# Patient Record
Sex: Female | Born: 1950
Health system: Southern US, Community
[De-identification: ages and names within clinical notes are randomized; demographics above are authoritative.]

## PROBLEM LIST (undated history)

## (undated) DIAGNOSIS — K589 Irritable bowel syndrome without diarrhea: Secondary | ICD-10-CM

## (undated) DIAGNOSIS — L309 Dermatitis, unspecified: Secondary | ICD-10-CM

## (undated) DIAGNOSIS — R51 Headache: Secondary | ICD-10-CM

## (undated) DIAGNOSIS — F419 Anxiety disorder, unspecified: Secondary | ICD-10-CM

## (undated) DIAGNOSIS — Z803 Family history of malignant neoplasm of breast: Secondary | ICD-10-CM

## (undated) DIAGNOSIS — Z923 Personal history of irradiation: Secondary | ICD-10-CM

## (undated) DIAGNOSIS — J300 Vasomotor rhinitis: Secondary | ICD-10-CM

## (undated) DIAGNOSIS — H04129 Dry eye syndrome of unspecified lacrimal gland: Secondary | ICD-10-CM

## (undated) DIAGNOSIS — N393 Stress incontinence (female) (male): Secondary | ICD-10-CM

## (undated) DIAGNOSIS — C801 Malignant (primary) neoplasm, unspecified: Secondary | ICD-10-CM

## (undated) DIAGNOSIS — J310 Chronic rhinitis: Secondary | ICD-10-CM

## (undated) DIAGNOSIS — Z853 Personal history of malignant neoplasm of breast: Secondary | ICD-10-CM

## (undated) DIAGNOSIS — Z85118 Personal history of other malignant neoplasm of bronchus and lung: Secondary | ICD-10-CM

## (undated) DIAGNOSIS — R519 Headache, unspecified: Secondary | ICD-10-CM

## (undated) DIAGNOSIS — IMO0002 Reserved for concepts with insufficient information to code with codable children: Secondary | ICD-10-CM

## (undated) DIAGNOSIS — Z9889 Other specified postprocedural states: Secondary | ICD-10-CM

## (undated) DIAGNOSIS — M1990 Unspecified osteoarthritis, unspecified site: Secondary | ICD-10-CM

## (undated) DIAGNOSIS — Z800 Family history of malignant neoplasm of digestive organs: Secondary | ICD-10-CM

## (undated) DIAGNOSIS — K219 Gastro-esophageal reflux disease without esophagitis: Secondary | ICD-10-CM

## (undated) DIAGNOSIS — R42 Dizziness and giddiness: Secondary | ICD-10-CM

## (undated) DIAGNOSIS — E0510 Thyrotoxicosis with toxic single thyroid nodule without thyrotoxic crisis or storm: Secondary | ICD-10-CM

## (undated) DIAGNOSIS — R112 Nausea with vomiting, unspecified: Secondary | ICD-10-CM

## (undated) DIAGNOSIS — C50919 Malignant neoplasm of unspecified site of unspecified female breast: Secondary | ICD-10-CM

## (undated) DIAGNOSIS — IMO0001 Reserved for inherently not codable concepts without codable children: Secondary | ICD-10-CM

## (undated) DIAGNOSIS — E049 Nontoxic goiter, unspecified: Secondary | ICD-10-CM

## (undated) HISTORY — PX: KNEE SURGERY: SHX244

## (undated) HISTORY — DX: Reserved for inherently not codable concepts without codable children: IMO0001

## (undated) HISTORY — PX: OTHER SURGICAL HISTORY: SHX169

## (undated) HISTORY — DX: Irritable bowel syndrome, unspecified: K58.9

## (undated) HISTORY — DX: Reserved for concepts with insufficient information to code with codable children: IMO0002

## (undated) HISTORY — DX: Malignant (primary) neoplasm, unspecified: C80.1

## (undated) HISTORY — PX: WISDOM TOOTH EXTRACTION: SHX21

## (undated) HISTORY — PX: LUNG REMOVAL, PARTIAL: SHX233

## (undated) HISTORY — DX: Family history of malignant neoplasm of breast: Z80.3

## (undated) HISTORY — DX: Vasomotor rhinitis: J30.0

## (undated) HISTORY — PX: SEPTOPLASTY: SUR1290

## (undated) HISTORY — DX: Dry eye syndrome of unspecified lacrimal gland: H04.129

## (undated) HISTORY — DX: Family history of malignant neoplasm of digestive organs: Z80.0

## (undated) HISTORY — PX: TRIGGER FINGER RELEASE: SHX641

## (undated) HISTORY — PX: MASTECTOMY: SHX3

## (undated) HISTORY — PX: NASAL SINUS SURGERY: SHX719

## (undated) HISTORY — DX: Chronic rhinitis: J31.0

---
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
## 1999-02-07 ENCOUNTER — Other Ambulatory Visit: Admission: RE | Admit: 1999-02-07 | Discharge: 1999-02-07 | Payer: Self-pay | Admitting: Family Medicine

## 1999-03-07 ENCOUNTER — Encounter: Payer: Self-pay | Admitting: Family Medicine

## 1999-03-07 ENCOUNTER — Ambulatory Visit (HOSPITAL_COMMUNITY): Admission: RE | Admit: 1999-03-07 | Discharge: 1999-03-07 | Payer: Self-pay | Admitting: Family Medicine

## 1999-07-14 ENCOUNTER — Ambulatory Visit (HOSPITAL_BASED_OUTPATIENT_CLINIC_OR_DEPARTMENT_OTHER): Admission: RE | Admit: 1999-07-14 | Discharge: 1999-07-14 | Payer: Self-pay | Admitting: Orthopedic Surgery

## 1999-07-14 HISTORY — PX: TRIGGER FINGER RELEASE: SHX641

## 1999-09-08 ENCOUNTER — Encounter: Payer: Self-pay | Admitting: Family Medicine

## 1999-09-08 ENCOUNTER — Encounter: Admission: RE | Admit: 1999-09-08 | Discharge: 1999-09-08 | Payer: Self-pay | Admitting: Family Medicine

## 2000-03-08 ENCOUNTER — Ambulatory Visit (HOSPITAL_COMMUNITY): Admission: RE | Admit: 2000-03-08 | Discharge: 2000-03-08 | Payer: Self-pay | Admitting: Neurology

## 2002-07-17 ENCOUNTER — Ambulatory Visit (HOSPITAL_BASED_OUTPATIENT_CLINIC_OR_DEPARTMENT_OTHER): Admission: RE | Admit: 2002-07-17 | Discharge: 2002-07-17 | Payer: Self-pay | Admitting: Orthopedic Surgery

## 2002-07-17 HISTORY — PX: TRIGGER FINGER RELEASE: SHX641

## 2002-07-31 ENCOUNTER — Ambulatory Visit (HOSPITAL_BASED_OUTPATIENT_CLINIC_OR_DEPARTMENT_OTHER): Admission: RE | Admit: 2002-07-31 | Discharge: 2002-07-31 | Payer: Self-pay | Admitting: Orthopedic Surgery

## 2002-07-31 HISTORY — PX: DE QUERVAIN'S RELEASE: SHX1439

## 2002-07-31 HISTORY — PX: TRIGGER FINGER RELEASE: SHX641

## 2002-08-10 ENCOUNTER — Encounter: Admission: RE | Admit: 2002-08-10 | Discharge: 2002-08-10 | Payer: Self-pay | Admitting: Family Medicine

## 2002-08-10 ENCOUNTER — Encounter: Payer: Self-pay | Admitting: Family Medicine

## 2002-10-15 ENCOUNTER — Encounter: Payer: Self-pay | Admitting: Gastroenterology

## 2002-10-15 ENCOUNTER — Ambulatory Visit (HOSPITAL_COMMUNITY): Admission: RE | Admit: 2002-10-15 | Discharge: 2002-10-15 | Payer: Self-pay | Admitting: Gastroenterology

## 2002-11-23 ENCOUNTER — Encounter: Payer: Self-pay | Admitting: Gastroenterology

## 2002-11-23 ENCOUNTER — Ambulatory Visit: Admission: RE | Admit: 2002-11-23 | Discharge: 2002-11-23 | Payer: Self-pay | Admitting: Gastroenterology

## 2003-09-22 ENCOUNTER — Encounter: Admission: RE | Admit: 2003-09-22 | Discharge: 2003-09-22 | Payer: Self-pay | Admitting: Gastroenterology

## 2003-09-22 IMAGING — US US ABDOMEN COMPLETE
1 series · 14 of 25 positions shown · non-contrast
Comparison: none

CLINICAL DATA: Right upper quadrant pain. 
 COMPLETE ABDOMINAL ULTRASOUND: 
 Gallbladder size and contour are normal.  No stones or wall thickening.  No biliary dilatation with the common duct measured at 3.3mm. 
 Liver, pancreas, and spleen normal.  Kidney size normal without obstruction or calculi.  Aorta and IVC normal.

[Series 1: unknown · 0.26mm/px · 14 of 77 slices shown]
[im 1/77]
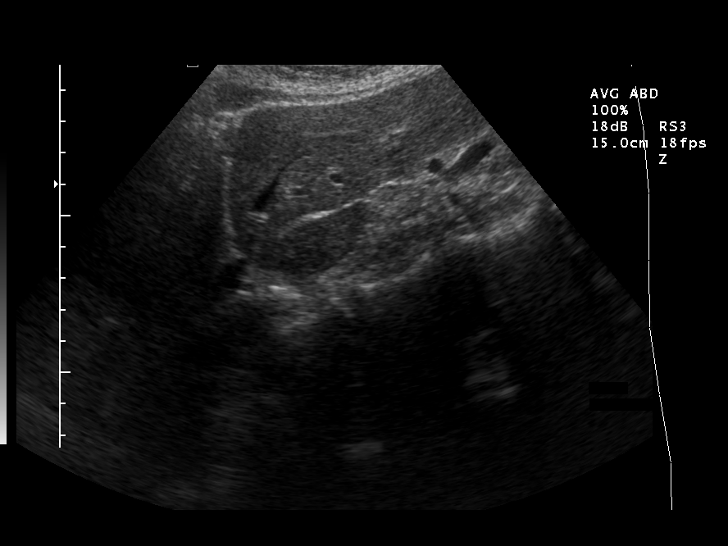
[im 7/77]
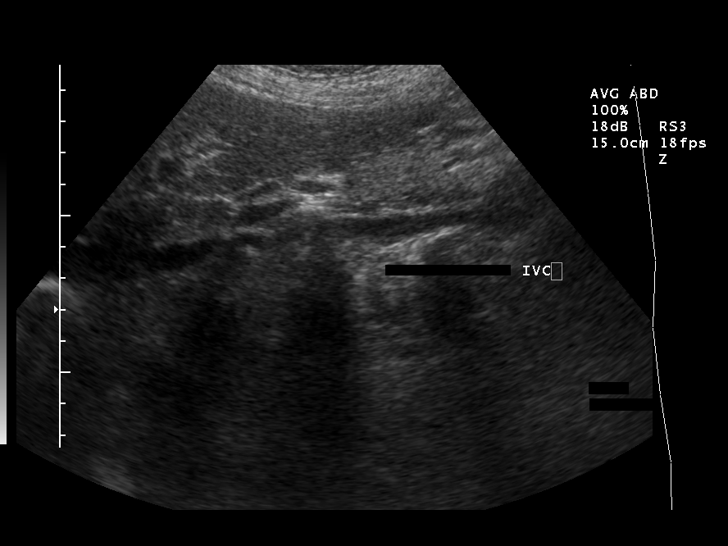
[im 13/77]
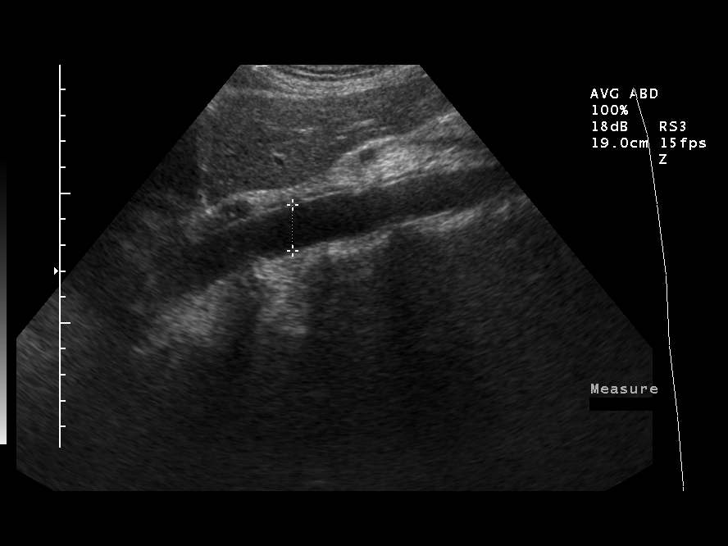
[im 20/77]
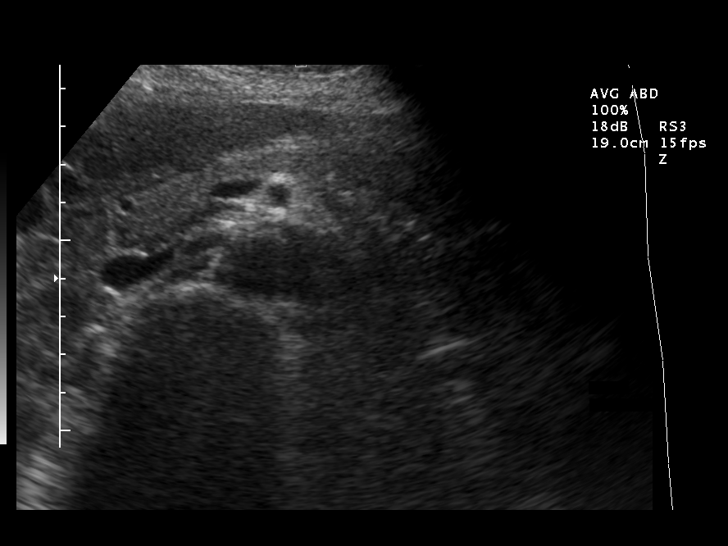
[im 26/77]
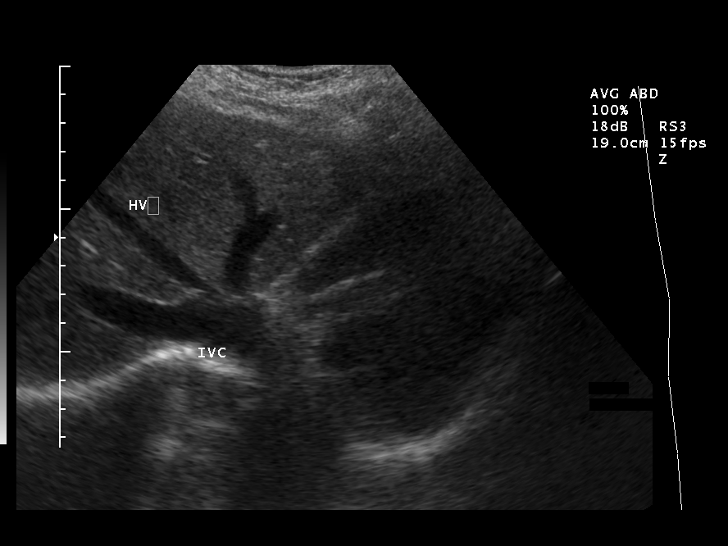
[im 29/77]
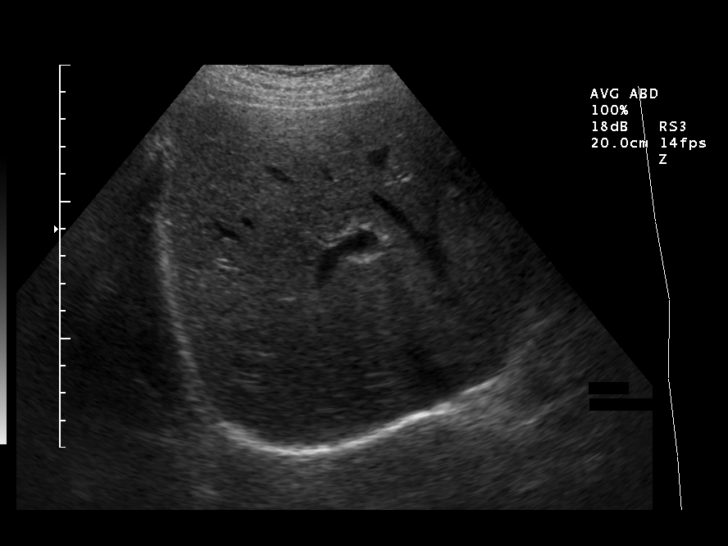
[im 35/77]
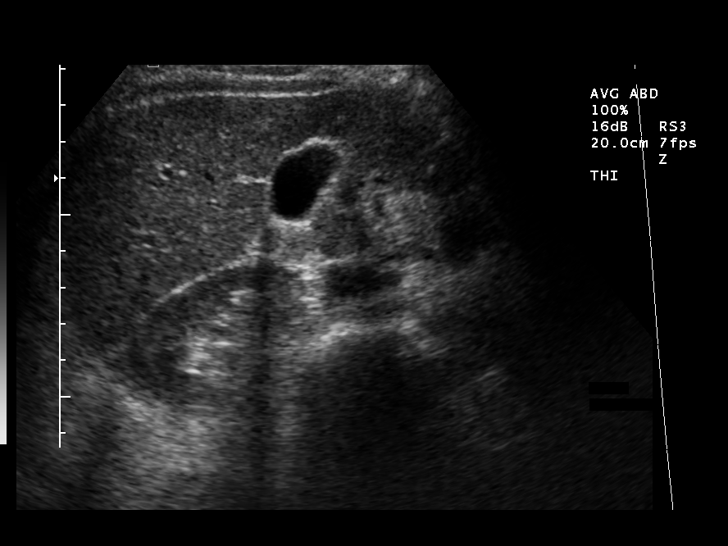
[im 42/77]
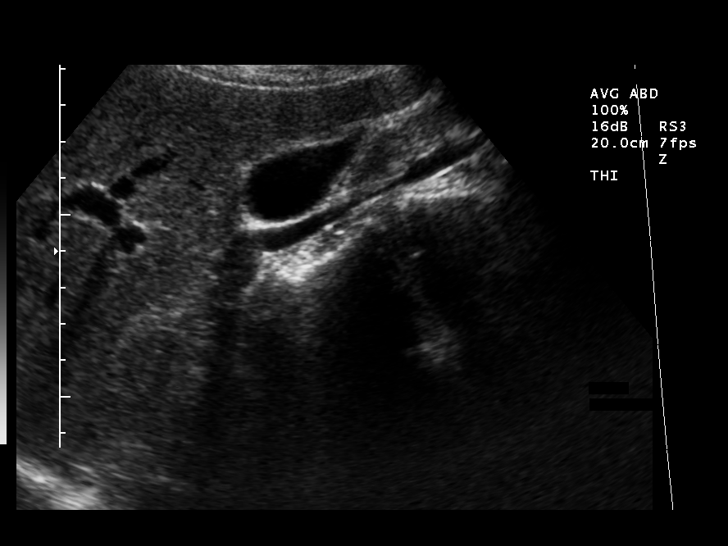
[im 48/77]
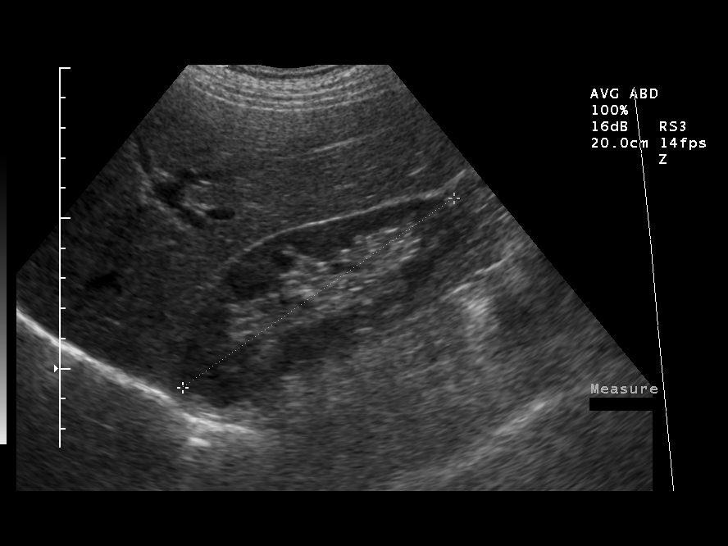
[im 51/77]
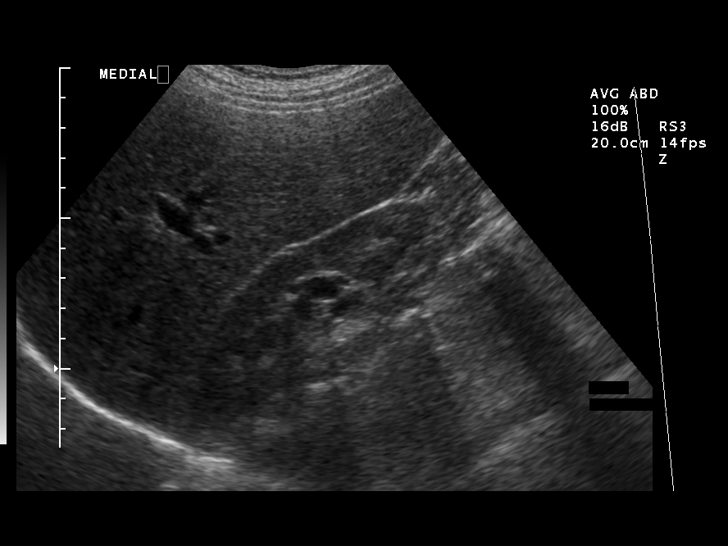
[im 58/77]
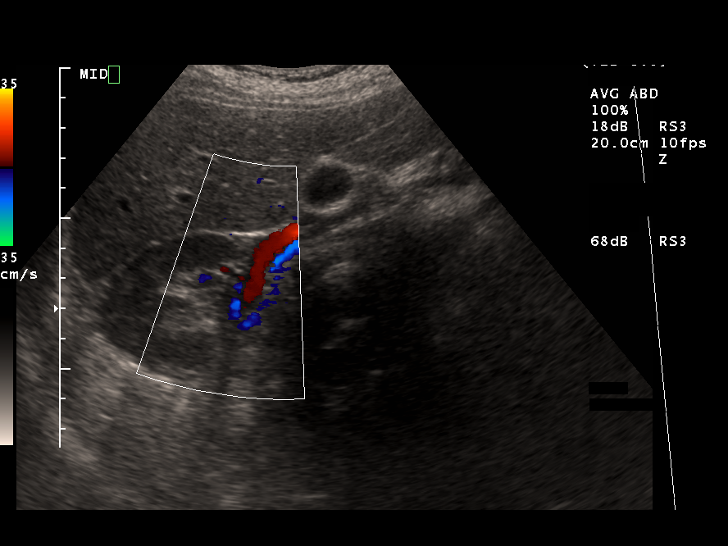
[im 64/77]
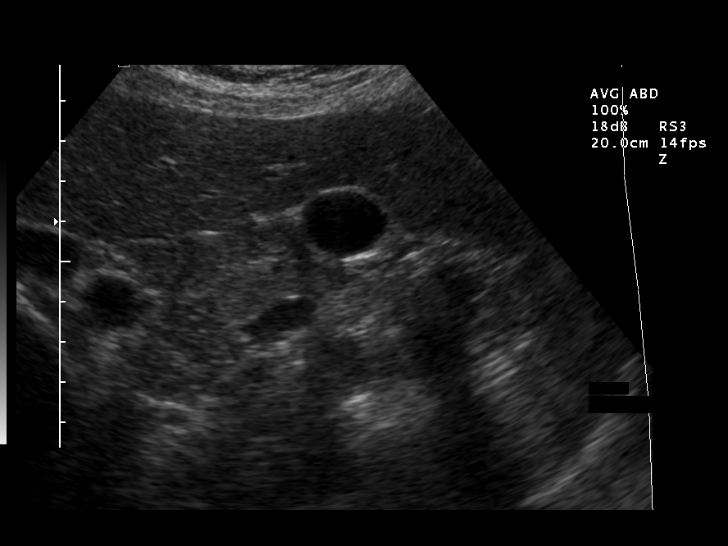
[im 70/77]
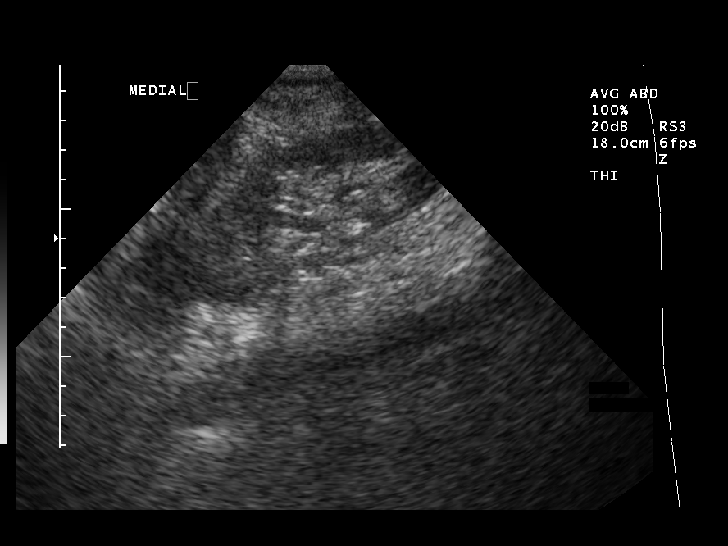
[im 77/77]
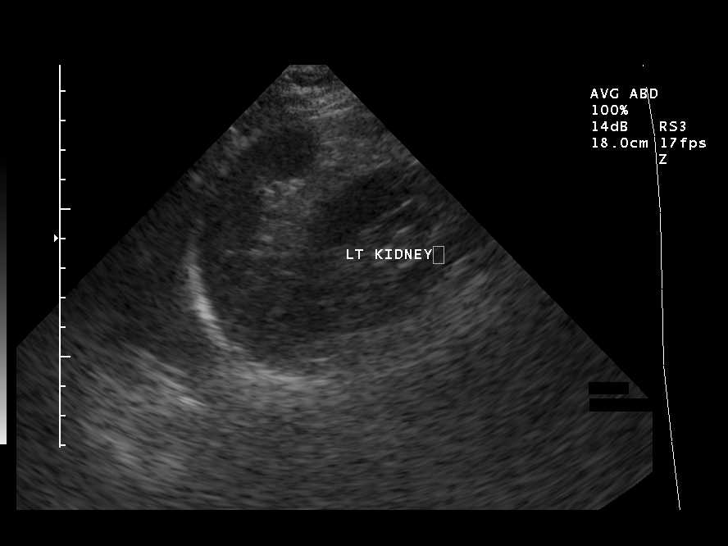

[14 of 25 positions shown; findings below may reference images not displayed]

IMPRESSION: Normal study.

## 2004-05-31 ENCOUNTER — Ambulatory Visit: Payer: Self-pay | Admitting: Pulmonary Disease

## 2004-06-02 ENCOUNTER — Ambulatory Visit (HOSPITAL_COMMUNITY): Admission: RE | Admit: 2004-06-02 | Discharge: 2004-06-02 | Payer: Self-pay | Admitting: Pulmonary Disease

## 2004-06-02 IMAGING — CT NM PET TUM IMG SKULL BASE T - THIGH
5 series · 25 of 25 positions shown · IV contrast ([ID])
Comparison: None.

CLINICAL DATA: Solitary left apical lung nodule on recent outside diagnostic
chest CT (not currently available for direct comparison).

FDG PET-CT TUMOR IMAGING (SKULL BASE TO THIGHS)  [DATE]:
Fasting Blood Glucose:  92
TECHNIQUE: 18.1 mCi F-18 FDG was injected intravenously via the right
antecubital vein .  Full-ring PET imaging was performed from the skull base
through the mid-thighs 60 minutes after injection.  CT data was obtained and
used for attenuation correction and anatomic localization only.  (This was not
acquired as a diagnostic CT examination.)

[Series 1: pet ac · axial · 3.3mm · 4.69mm/px · z∈[-870,+0]mm · 6 of 267 slices shown]
[im 1/267]
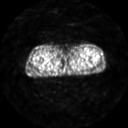
[im 54/267]
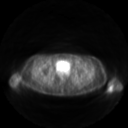
[im 107/267]
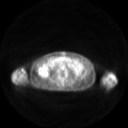
[im 160/267]
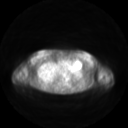
[im 213/267]
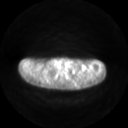
[im 267/267]
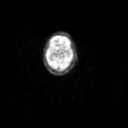

[Series 2: pet nac · axial · 3.3mm · 4.69mm/px · z∈[-870,+0]mm · 6 of 267 slices shown]
[im 1/267]
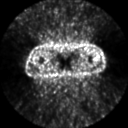
[im 54/267]
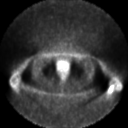
[im 107/267]
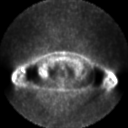
[im 160/267]
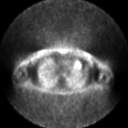
[im 213/267]
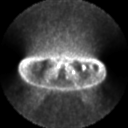
[im 267/267]
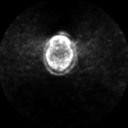

[Series 2: ct images · axial · 3.8mm · 0.98mm/px · z∈[-870,+0]mm · 6 of 267 slices shown]
[im 1/267]
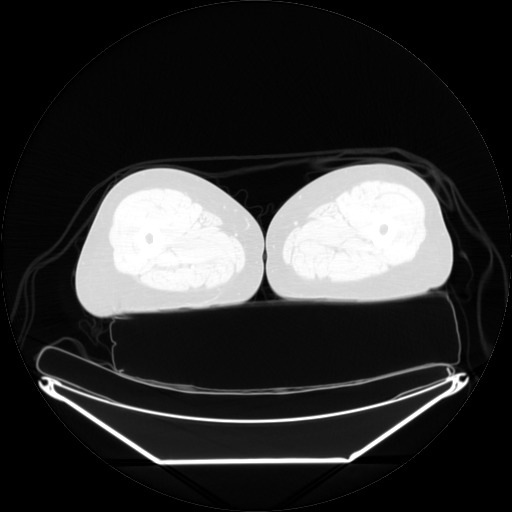
[im 54/267]
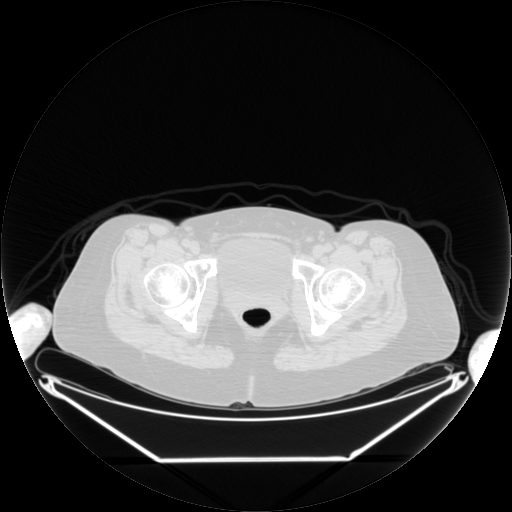
[im 107/267]
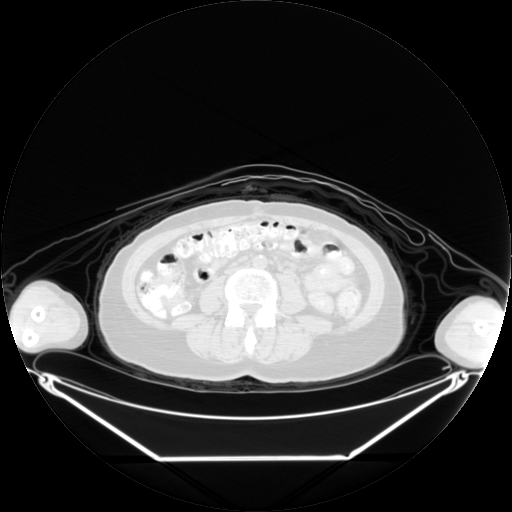
[im 160/267]
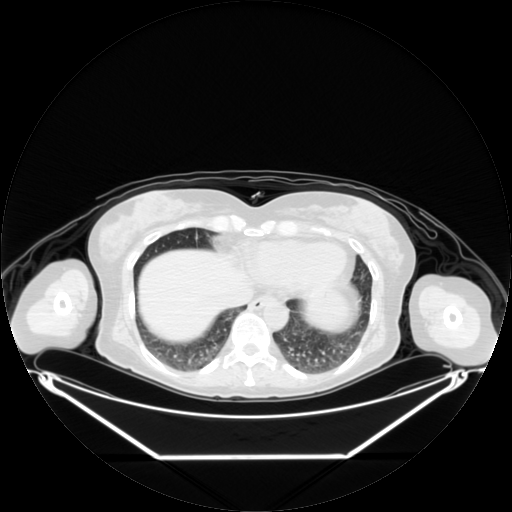
[im 213/267]
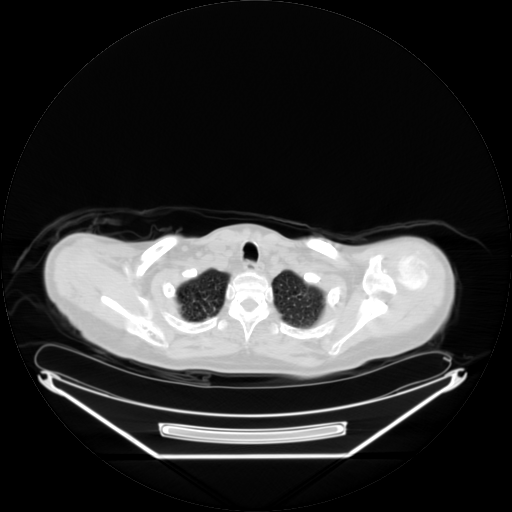
[im 267/267  brain]
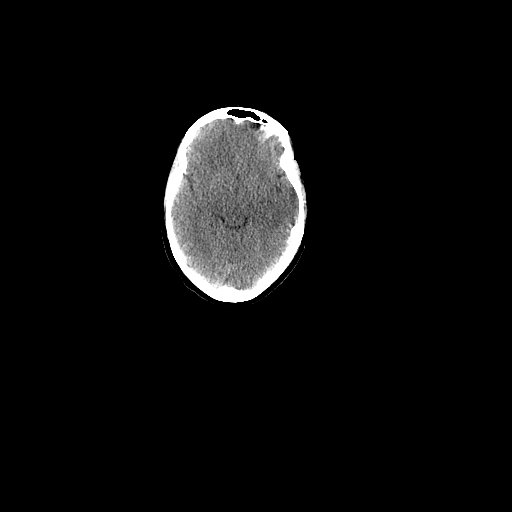

[Series 3: pet ac (id) · axial · 3.3mm · 4.69mm/px · z∈[-870,+0]mm · 6 of 267 slices shown]
[im 1/267]
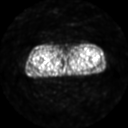
[im 54/267]
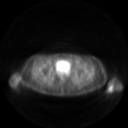
[im 107/267]
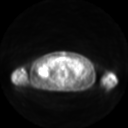
[im 160/267]
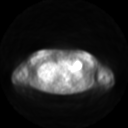
[im 213/267]
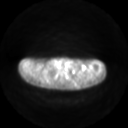
[im 267/267]
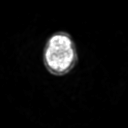

[Series 151: reformatted · axial · 3.3mm · 0.98mm/px · 1 of 2 slices shown]
[im 1/2]
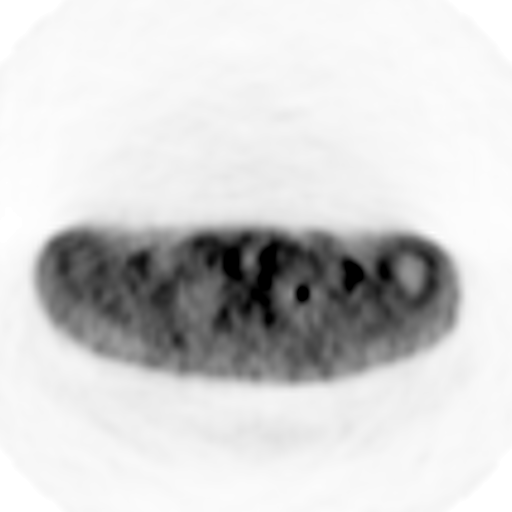

[25 of 25 positions shown; findings below may reference images not displayed]

FINDINGS: The approximate 1 cm nodule in the apex of the left lung does
demonstrate increased metabolic activity with a maximum SUV of 3.6. Given the
small size of the nodule, this is consistent with malignancy.

No abnormal metabolic activity is identified in the neck, chest, abdomen or
pelvis to suggest metastatic disease. There is increased metabolic activity
localizing to hypermetabolic brown fat in the supraclavicular regions
bilaterally and in the fat adjacent to several upper thoracic vertebra. There is
also a focus of increased metabolic activity localizing to a nodule in the right
lobe of the thyroid gland.

Physiologic activity is present within the left ventricle. There is physiologic
activity in the luminal contents of the large and small bowel. Physiologic
excretion into the urinary tract is noted.
IMPRESSION: 1. Increased metabolic activity within the left apical lung nodule, consistent
with malignancy.

2. No evidence of metastatic disease.

3. Increased metabolic activity within a nodule in the right lobe of the thyroid
gland. This is a nonspecific finding area can be seen in malignancy or an
hyperfunctioning thyroid nodules. 

4. Hypermetabolic brown fat in the supraclavicular regions bilaterally and
adjacent to several upper thoracic vertebra.

## 2004-06-15 ENCOUNTER — Encounter (HOSPITAL_COMMUNITY): Admission: RE | Admit: 2004-06-15 | Discharge: 2004-09-13 | Payer: Self-pay | Admitting: Pulmonary Disease

## 2004-06-16 IMAGING — NM NM THYROID IMAGING W/ UPTAKE SINGLE (24 HR)
1 series · 1 of 1 positions shown · non-contrast
Comparison: PET CT of [DATE].

CLINICAL DATA: Thyroid nodule seen on PET scan. 
NUCLEAR MEDICINE THYROID SCAN:

[thyroid scan · 1 of 1 slices shown]
[im 1/1]
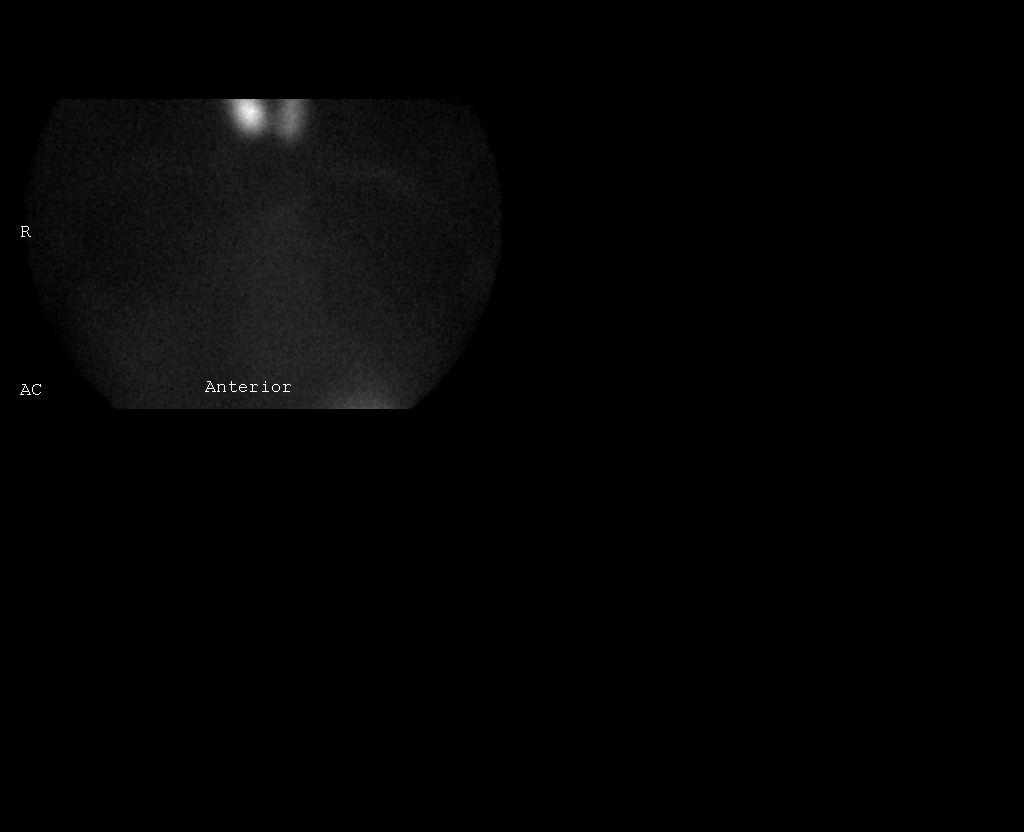

[1 of 1 positions shown; findings below may reference images not displayed]

There is heterogeneity in the right thyroid lobe.  This includes a hot nodule inferiorly in the right thyroid lobe.  There may be several separate nodules based on oblique imaging.  The hot nodule at the base of the thyroid measures approximately 1.2 x 0.7 cm.  The right lobe itself measures approximately 3.5 cm in greatest length and the left lobe 3.1 cm in greatest length.
IMPRESSION: Heterogeneity in the inferior aspect of the right thyroid gland corresponding to the abnormal FDG activity on PET scan.  Although this nodule is predominately increased in activity, there is some lower activity surrounding it, and the possibility of thyroid cancer is not readily excluded particularly in light of the PET CT findings.  I believe the inferior aspect of the right thyroid gland merits fine needle aspiration in this setting.

## 2004-06-19 ENCOUNTER — Ambulatory Visit: Payer: Self-pay | Admitting: Pulmonary Disease

## 2004-06-28 ENCOUNTER — Encounter (INDEPENDENT_AMBULATORY_CARE_PROVIDER_SITE_OTHER): Payer: Self-pay | Admitting: *Deleted

## 2004-06-28 ENCOUNTER — Other Ambulatory Visit: Admission: RE | Admit: 2004-06-28 | Discharge: 2004-06-28 | Payer: Self-pay | Admitting: Interventional Radiology

## 2004-06-28 ENCOUNTER — Encounter: Admission: RE | Admit: 2004-06-28 | Discharge: 2004-06-28 | Payer: Self-pay | Admitting: Pulmonary Disease

## 2004-06-28 IMAGING — US US BIOPSY
1 series · 13 of 14 positions shown · non-contrast
Comparison: none

CLINICAL DATA: 53-year-old female with hypermetabolic activity within the right lobe of thyroid seen on recent PET CT scan.  This nodule was predominately hot on nuclear medicine thyroid scan.  Request to perform fine needle aspiration. 
Comparison; 1.  PET CT scan [DATE].  2.  Nuclear medicine thyroid scan [DATE].  The patient also had a thyroid ultrasound which was performed at [HOSPITAL]. Those images were not available for comparison. 
Procedure: 
ULTRASOUND GUIDED FINE NEEDLE ASPIRATION RIGHT LOBE OF THYROID:
The above procedure was thoroughly discussed with the patient and questions were answered.  Written informed consent was obtained prior to the procedure. 
Ultrasound was then performed which demonstrated multiple nodules located within both the mid and inferior aspect of the right lobe of the thyroid.  The dominant nodule located within the inferior aspect of the right lobe of the thyroid measuring approximately 1.2 x 1.3 cm was localized and marked for aspiration.  The patient was then prepped and draped in a normal sterile fashion, 1 percent Lidocaine was used for local anesthesia.  Using direct ultrasound guidance a total of four passes were made into the inferior nodule using a 25 gauge hypodermic needle.  Ultrasound confirmed placement of the needle on all four occasions.  The specimens were given to pathology for further analysis.  Post-procedure imaging demonstrated no immediate complication or hematoma.  The patient tolerated the procedure well.

[Series 1: unknown · 0.07mm/px · 13 of 14 slices shown]
[im 1/14]
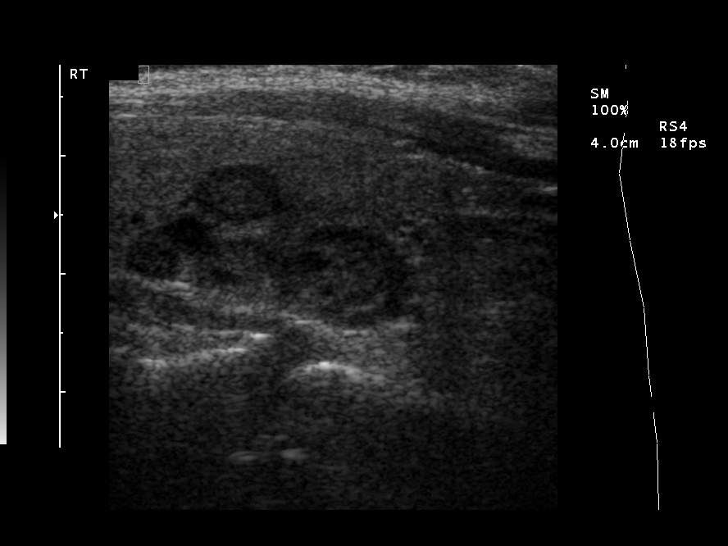
[im 2/14]
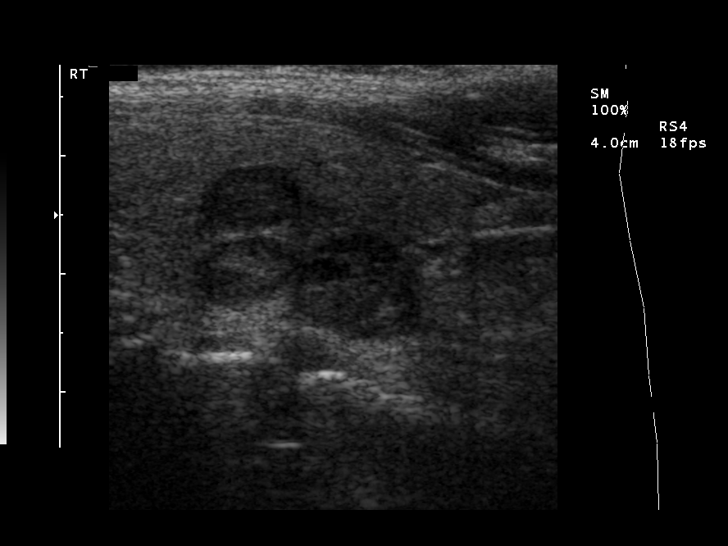
[im 3/14]
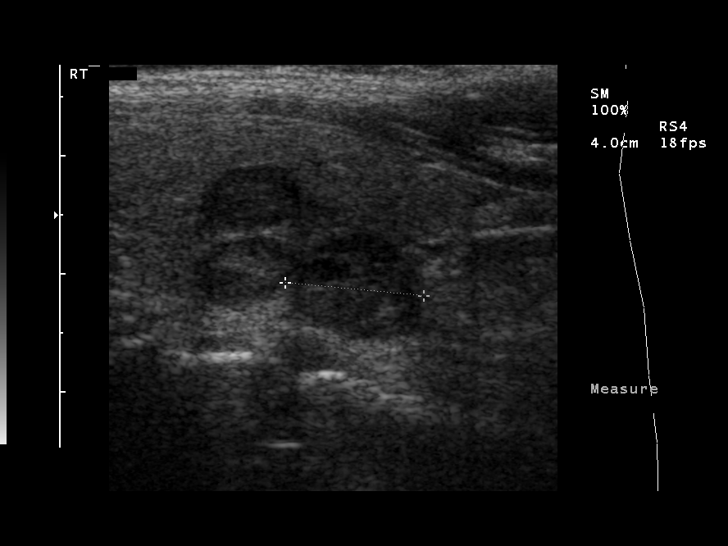
[im 4/14]
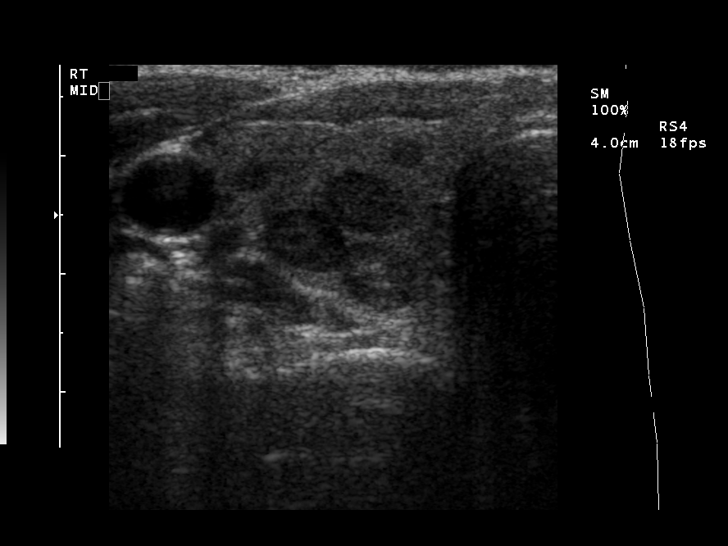
[im 5/14]
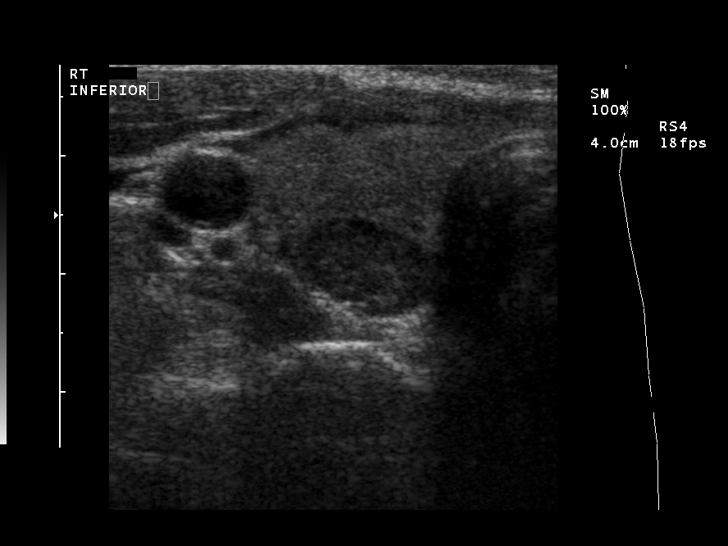
[im 6/14]
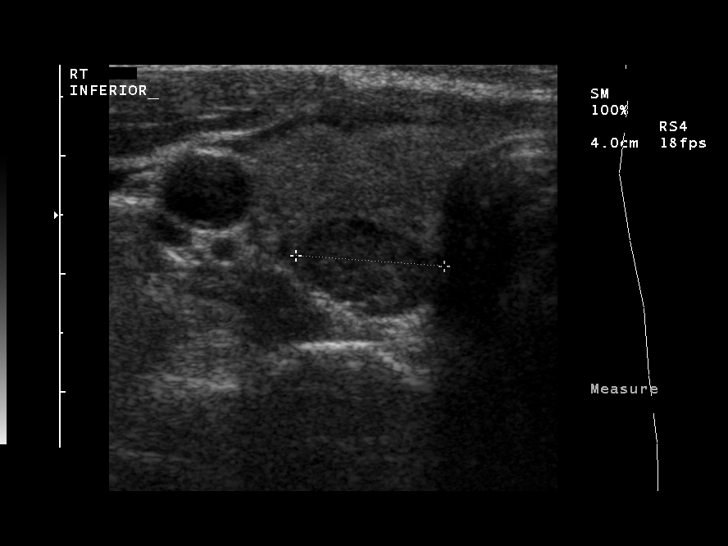
[im 8/14]
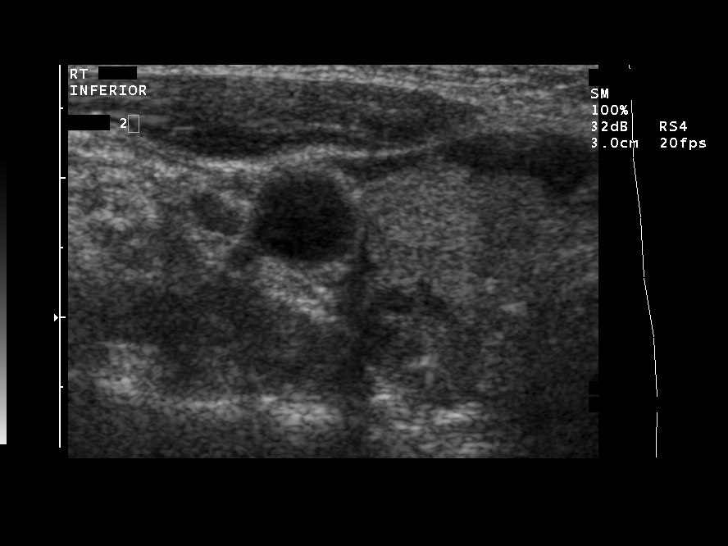
[im 9/14]
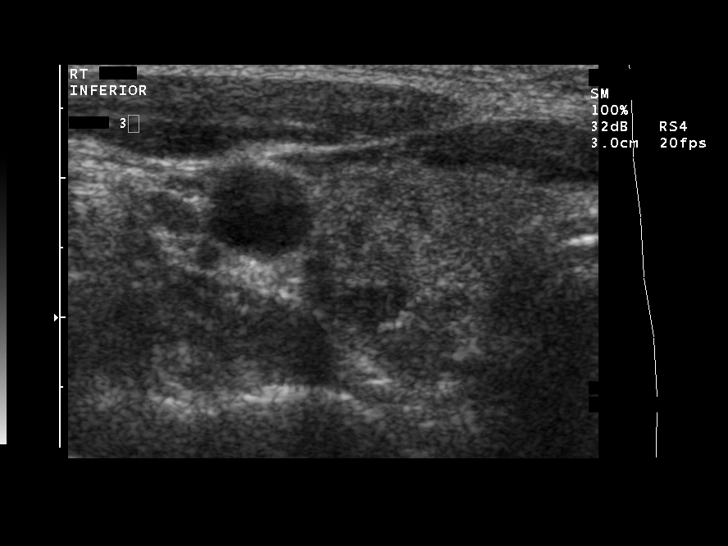
[im 10/14]
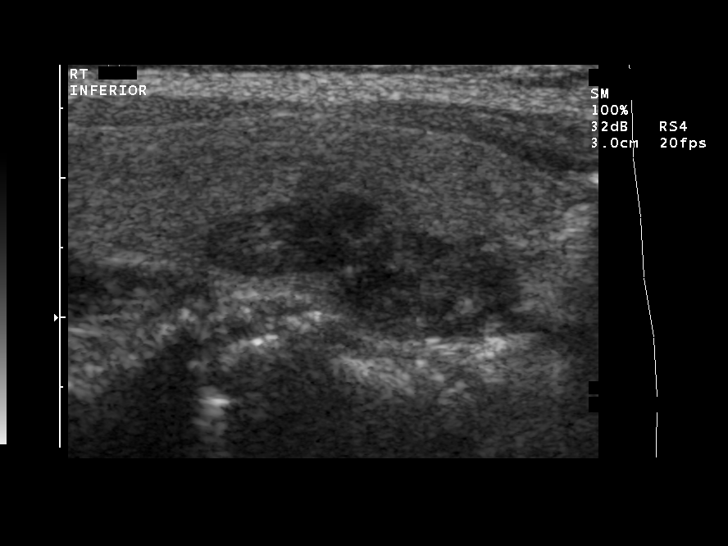
[im 11/14]
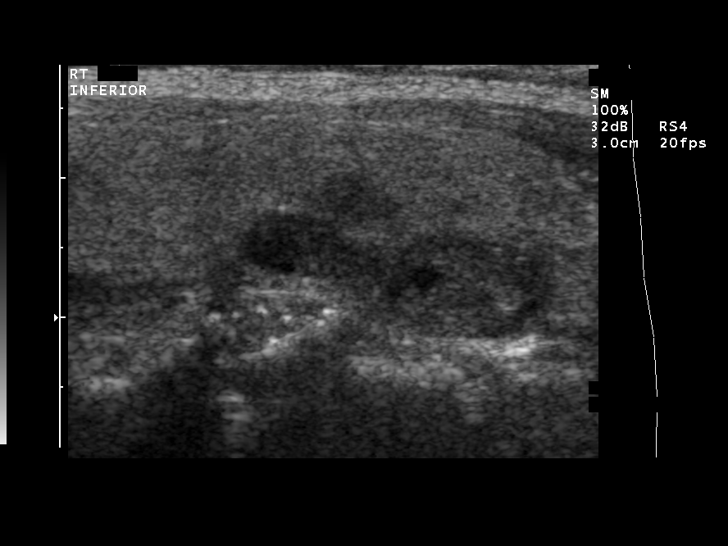
[im 12/14]
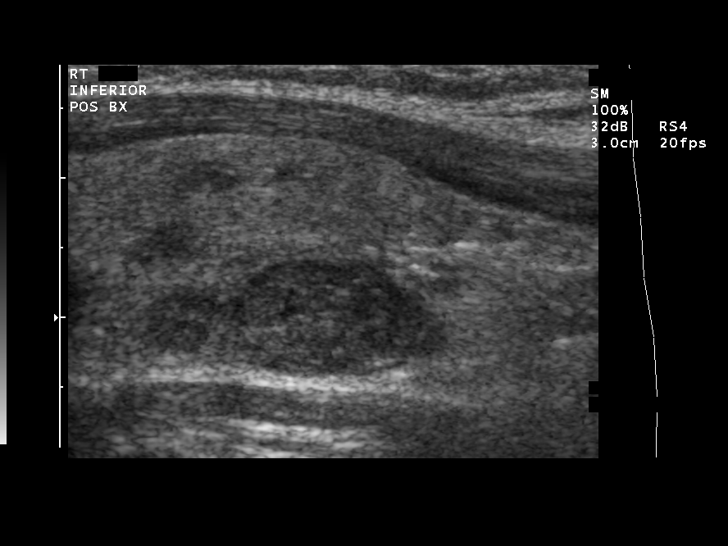
[im 13/14]
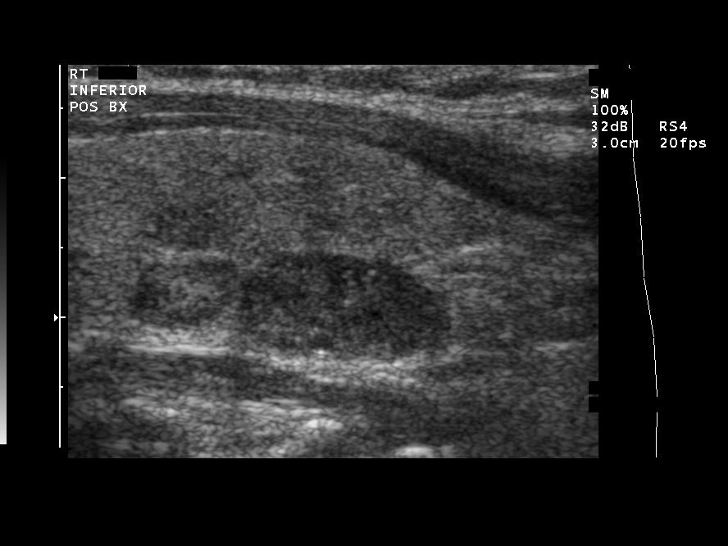
[im 14/14]
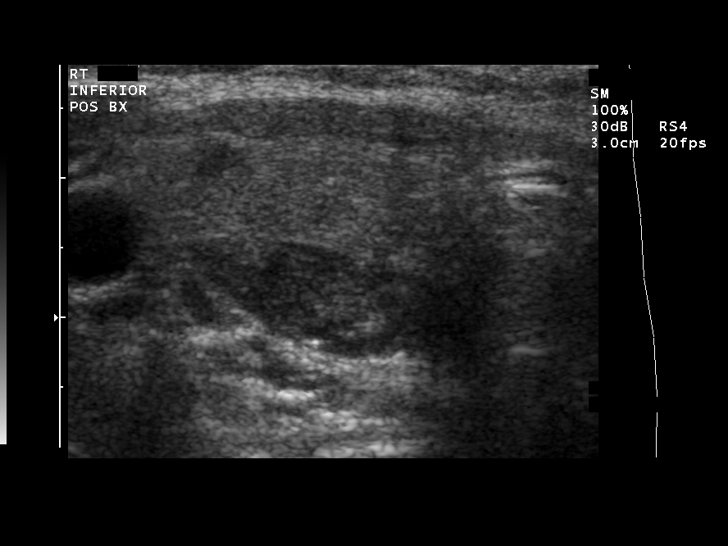

[13 of 14 positions shown; findings below may reference images not displayed]

IMPRESSION: 1.  Successful ultrasound guided fine needle aspiration of dominant nodule within the inferior aspect of the right lobe of the thyroid.  Final pathology pending. 
2.  At least five other subcentimeter nodules were demonstrated within the mid right lobe of the thyroid.  thyroid ultrasound performed at [HOSPITAL] not available for comparison.

## 2004-07-14 ENCOUNTER — Encounter (INDEPENDENT_AMBULATORY_CARE_PROVIDER_SITE_OTHER): Payer: Self-pay | Admitting: *Deleted

## 2004-07-14 ENCOUNTER — Inpatient Hospital Stay (HOSPITAL_COMMUNITY): Admission: RE | Admit: 2004-07-14 | Discharge: 2004-07-20 | Payer: Self-pay | Admitting: Thoracic Surgery

## 2004-07-14 HISTORY — PX: VIDEO ASSISTED THORACOSCOPY (VATS)/ LOBECTOMY: SHX6169

## 2004-07-14 HISTORY — PX: THORACOTOMY: SUR1349

## 2004-07-14 IMAGING — CR DG CHEST 1V PORT
1 series · 1 of 1 positions shown · non-contrast
Comparison: [DATE].

CLINICAL DATA: Status post VATS.  
 CHEST  PORTABLE - 1 VIEW -   [8C] HOURS:

[view not recorded]
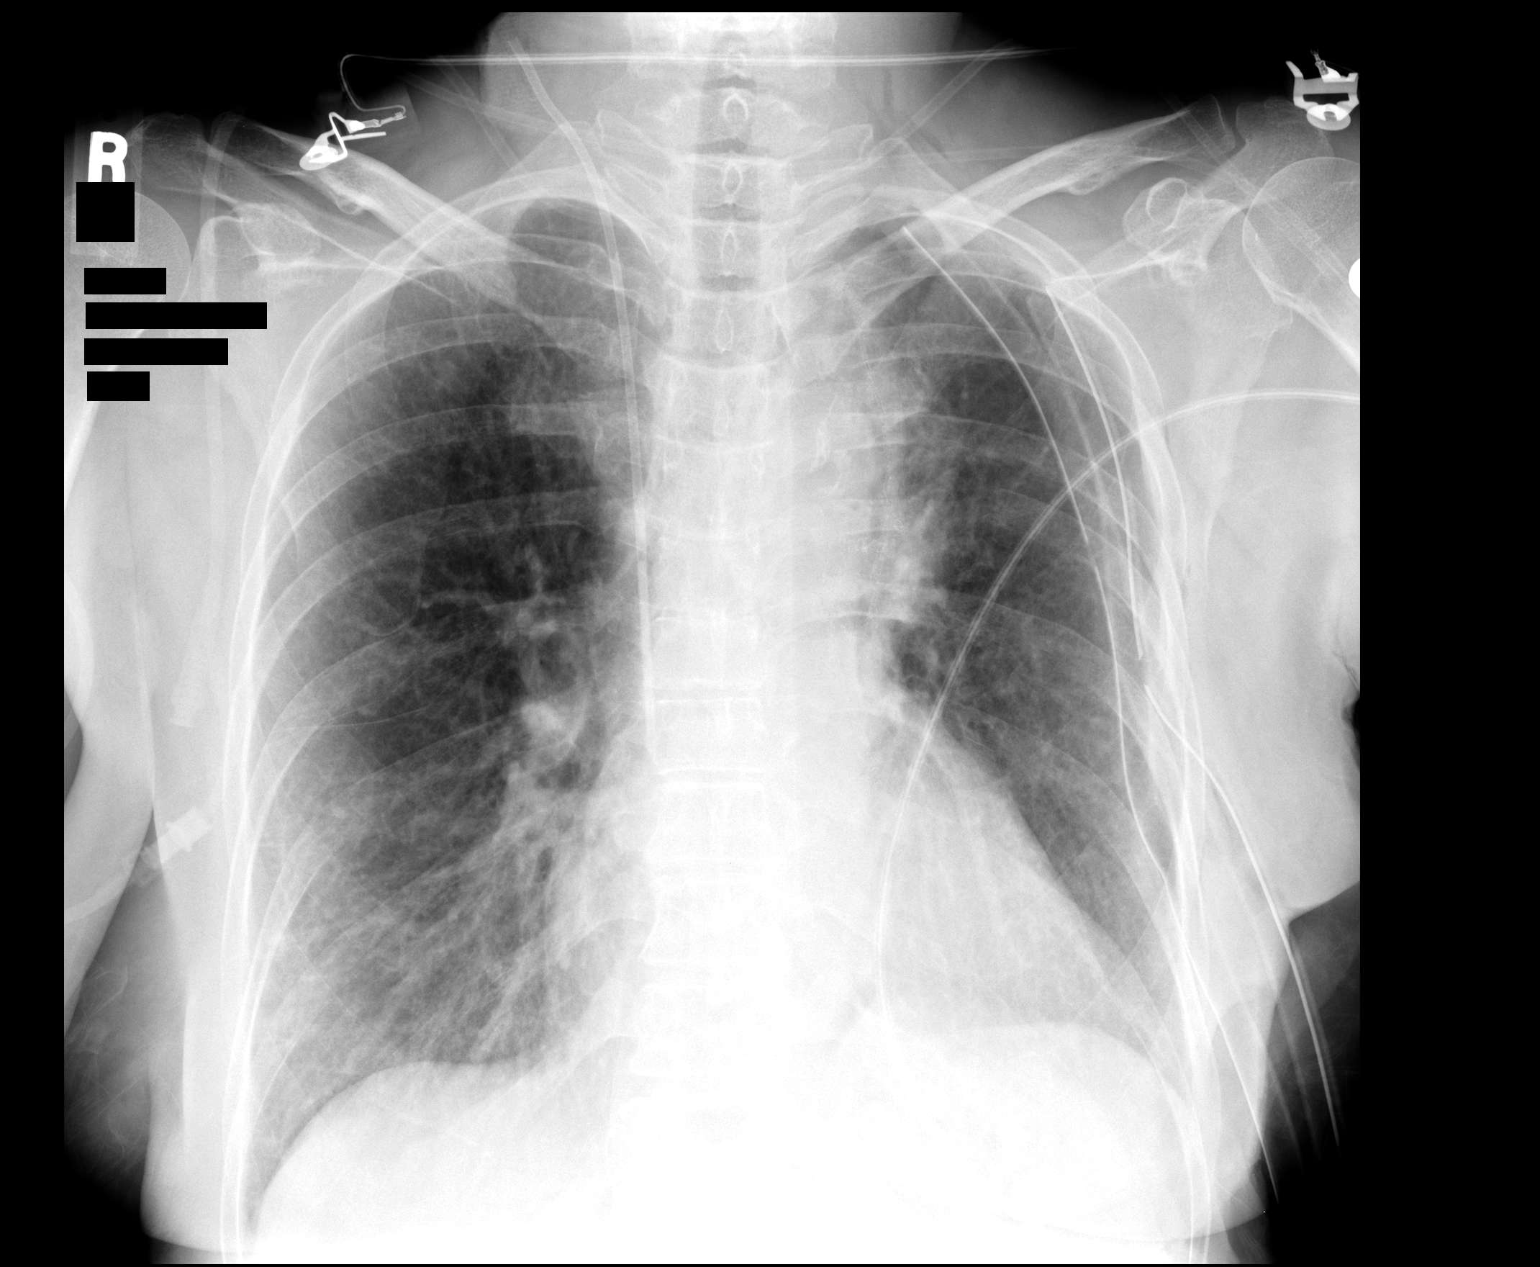

[1 of 1 positions shown; findings below may reference images not displayed]

Two chest tubes are present on the left.  There is no pneumothorax.  The central line tip is in the cavoatrial junction.  There is mild right lower lobe atelectasis.  There is no effusion.
IMPRESSION: Two chest tubes on the left without pneumothorax.

## 2004-07-15 IMAGING — CR DG CHEST 1V PORT
1 series · 1 of 1 positions shown · non-contrast
Comparison: [DATE].

CLINICAL DATA: Status post left VATS for lung mass.
 PORTABLE SINGLE VIEW CHEST ? [DATE] AT [VA] HOURS:

[view not recorded]
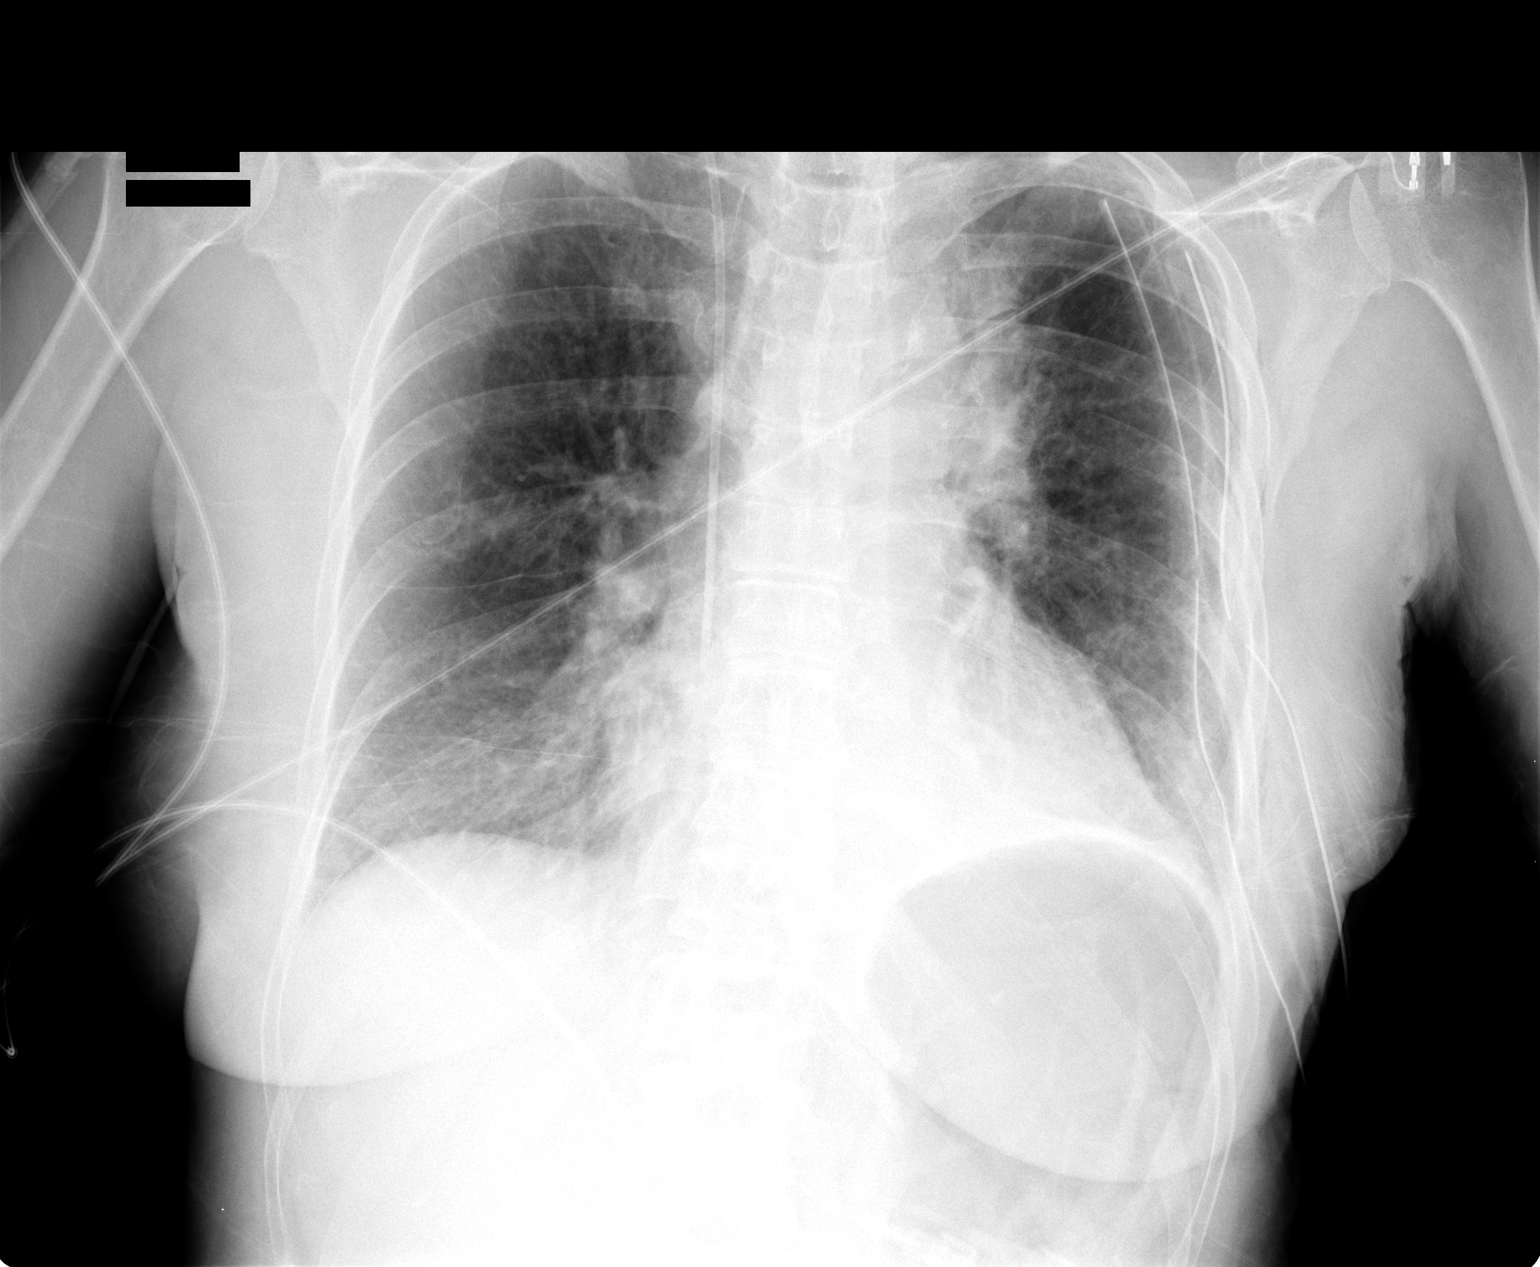

[1 of 1 positions shown; findings below may reference images not displayed]

Two left chest tubes remain in place.  No pneumothorax.  Slight increased prominence in bibasilar atelectasis.  Stable position of the central line.  No edema or infiltrate.
IMPRESSION: No pneumothorax.  Interval increase in bibasilar atelectasis.

## 2004-07-16 IMAGING — CR DG CHEST 1V PORT
1 series · 1 of 1 positions shown · non-contrast
Comparison: [DATE].

CLINICAL DATA: Postop VATS procedure.
 PORTABLE CHEST - 1 VIEW:

[view not recorded]
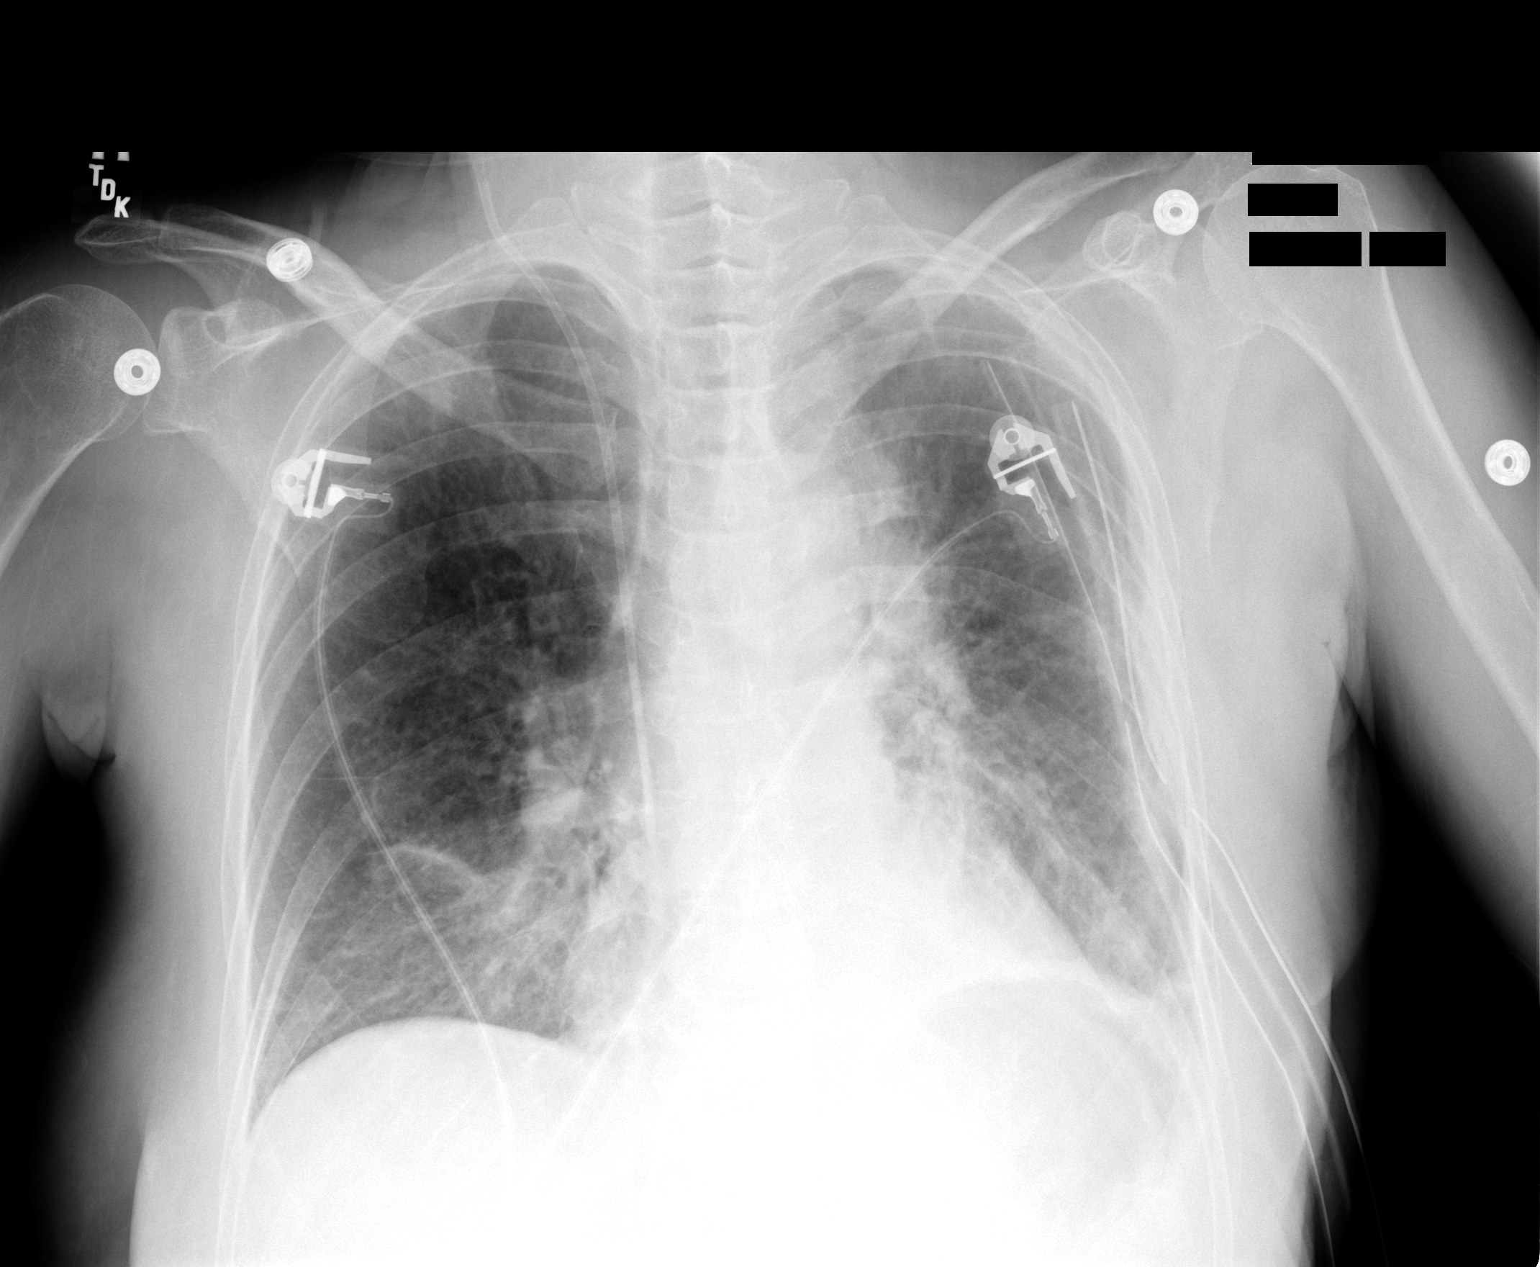

[1 of 1 positions shown; findings below may reference images not displayed]

Two left chest tubes and central venous catheter remain in satisfactory position.  Negative for pneumothorax.  Progressive lingular and left lower lobe atelectasis.  Negative for edema.  The heart is normal size.
IMPRESSION: 1.  Negative for pneumothorax.
 2.  Progressive atelectasis.

## 2004-07-17 IMAGING — CR DG CHEST 1V PORT
1 series · 1 of 1 positions shown · non-contrast
Comparison: [DATE].

CLINICAL DATA: Lung lesion.
 PORTABLE CHEST - 1 VIEW - [DATE] AT [PF] HOURS:

[view not recorded]
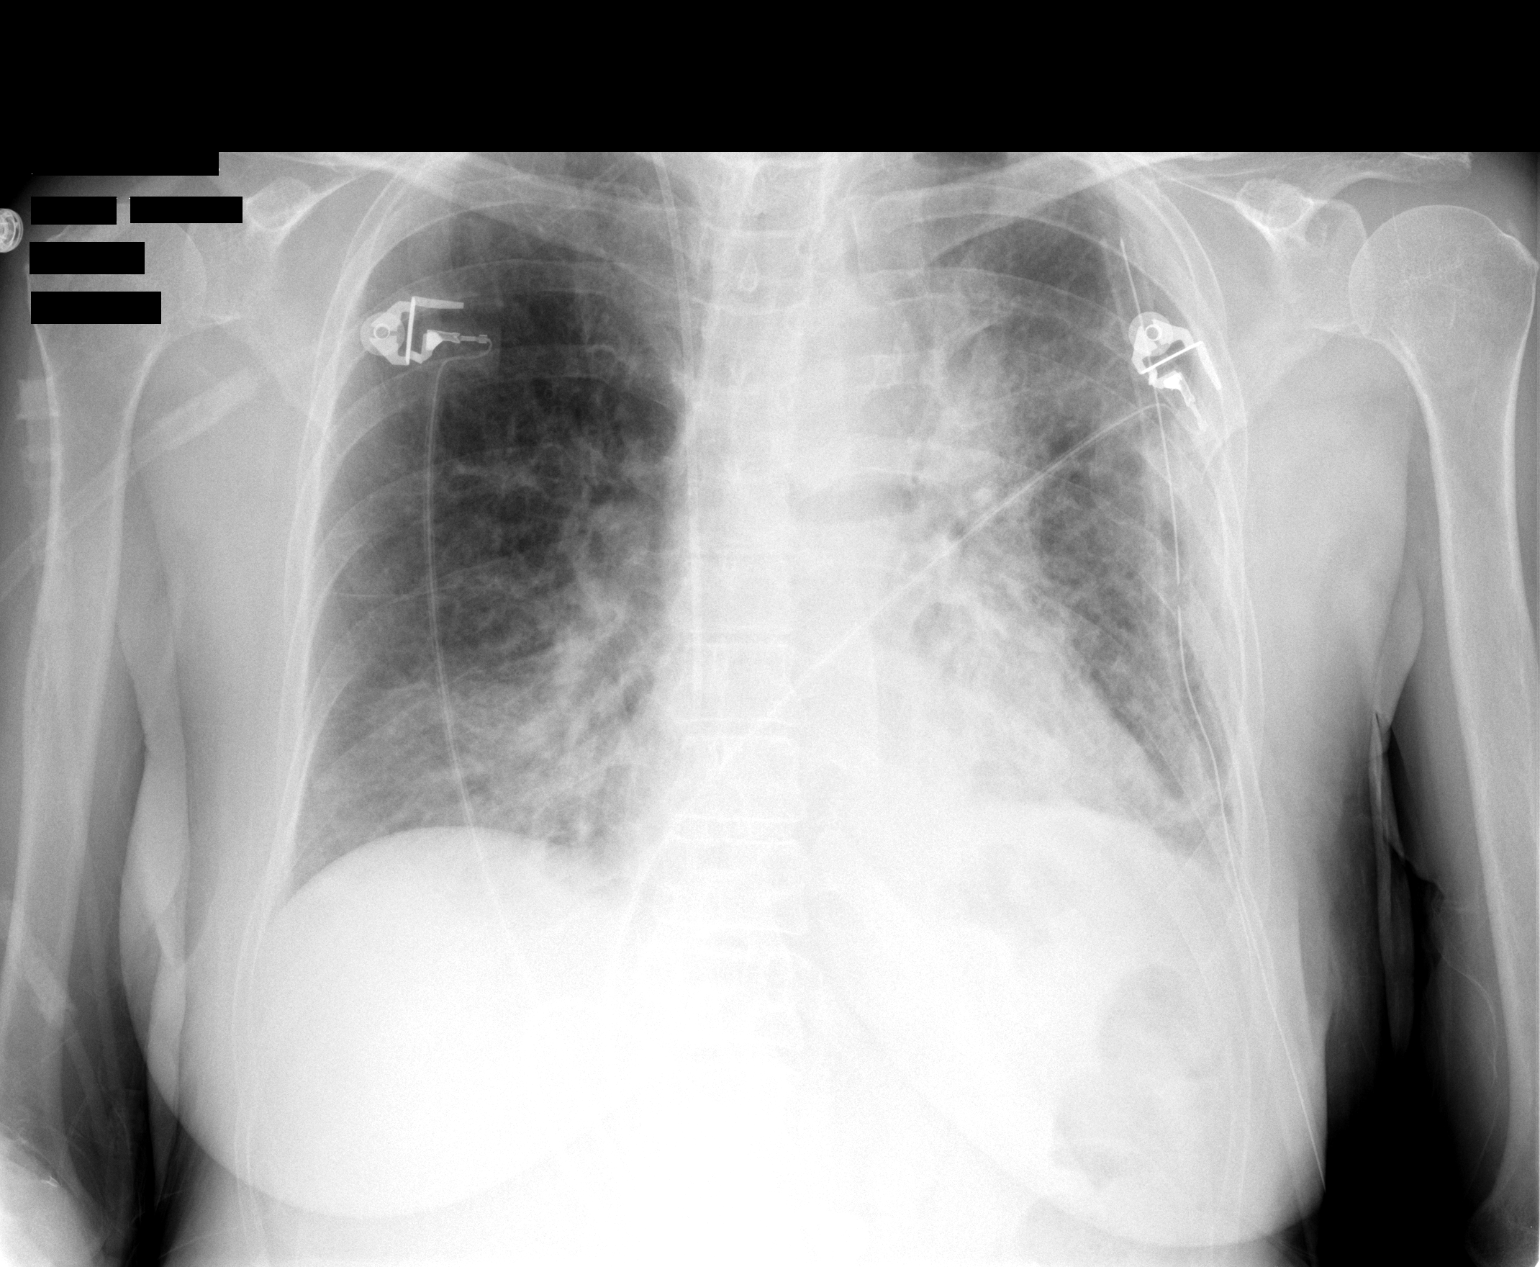

[1 of 1 positions shown; findings below may reference images not displayed]

FINDINGS: The central venous catheter is stable.  One of the left chest tubes has been removed with one left in place.  A linear interface is seen across the left apex however, pulmonary markings can be seen beyond this interface.  This is of unknown significance.  Interstitial edema throughout both lungs has worsened.  Bibasilar atelectasis is stable.
IMPRESSION: 1.  Worsening interstitial edema.
 2.  One left chest tube has been removed and there is a questionable left apical pneumothorax.  Follow-up study today is recommended.

## 2004-07-17 IMAGING — CR DG CHEST 1V PORT
1 series · 1 of 1 positions shown · non-contrast
Comparison: none

CLINICAL DATA: Lung lesions, status-post chest tube removal. 
PORTABLE CHEST - [EB] ? [DATE] ([EB]):

[view not recorded]
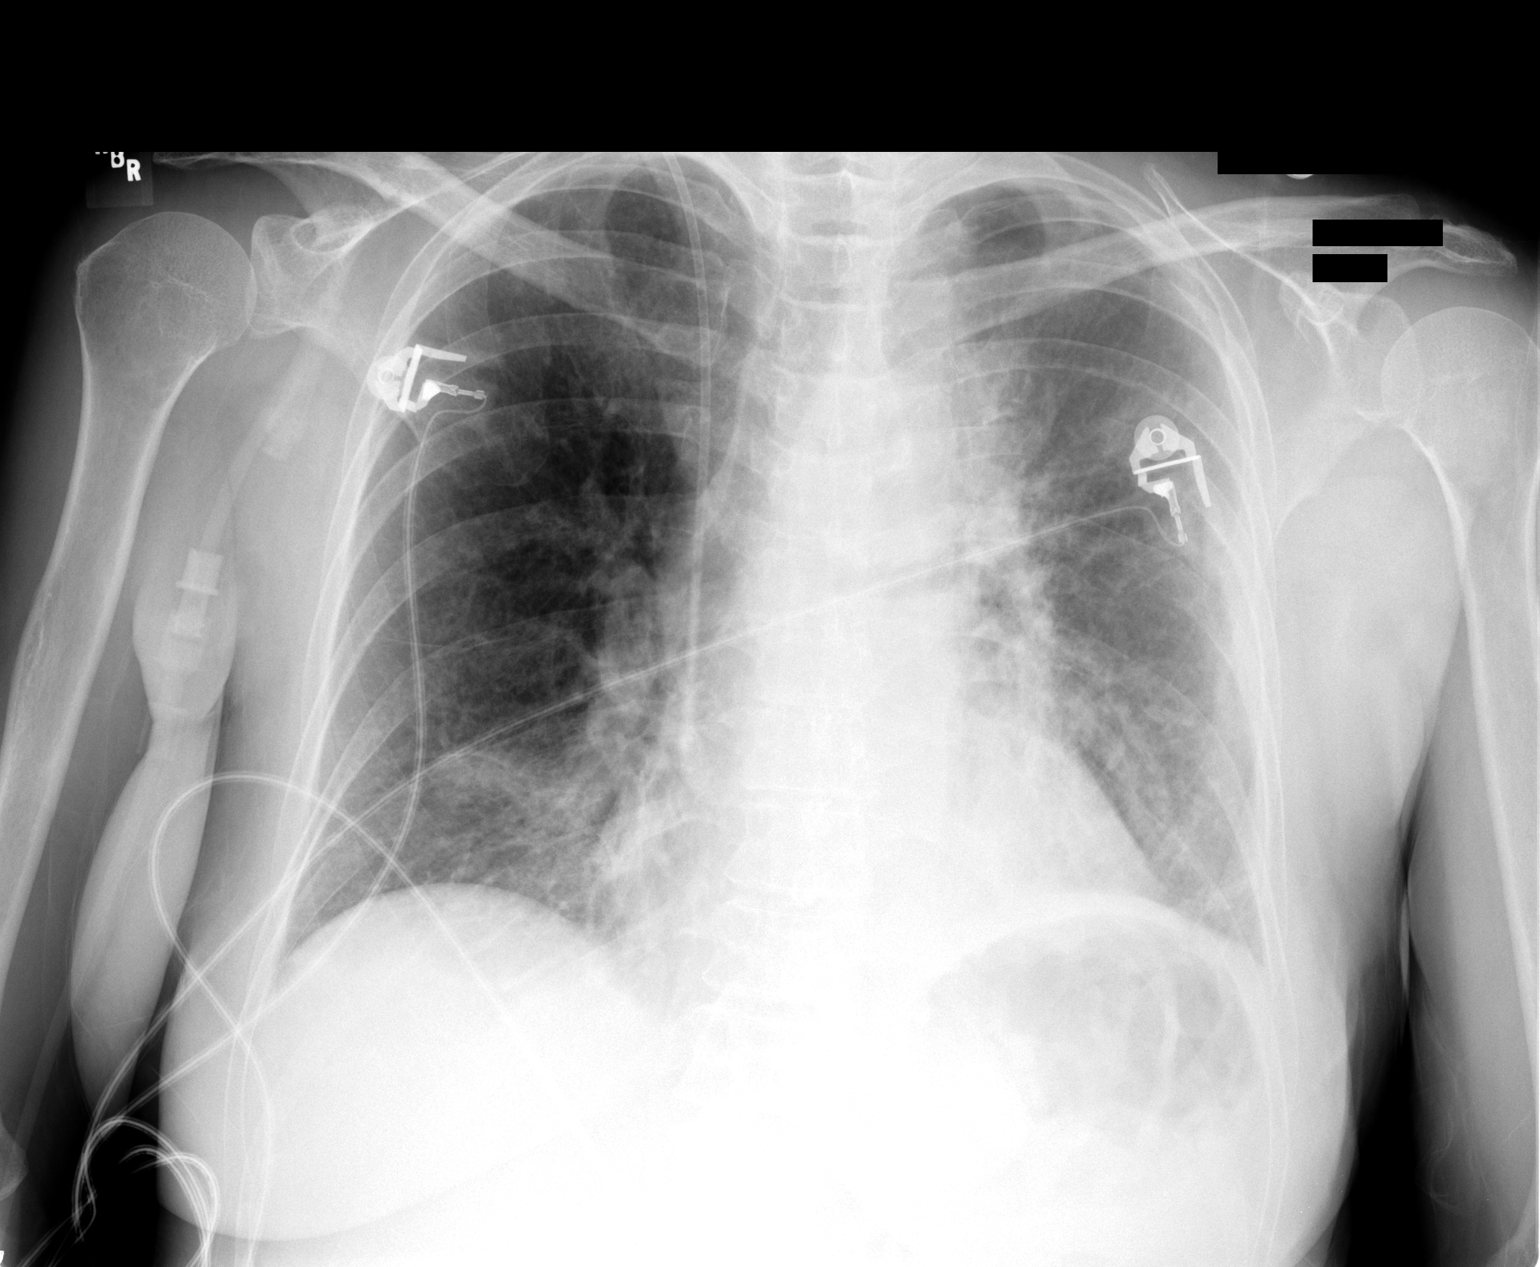

[1 of 1 positions shown; findings below may reference images not displayed]

FINDINGS: The patient?s left chest tube has been removed. There is a small left apical pneumothorax.  It has increased in size since the comparison study.  The right IJ catheter remains in place. Right base aeration  has improved. There is interstitial edema.
IMPRESSION: 1.  Status-post removal of left chest tube with some interval increase in a small left apical pneumothorax. 
2.  Improved right basilar aeration.

## 2004-07-18 IMAGING — CR DG CHEST 2V
2 series · 2 of 2 positions shown · non-contrast
Comparison: [DATE].

CLINICAL DATA: 53-year-old; lung surgery; chest tube removal
CHEST - 2 VIEW:

[w chest pa]
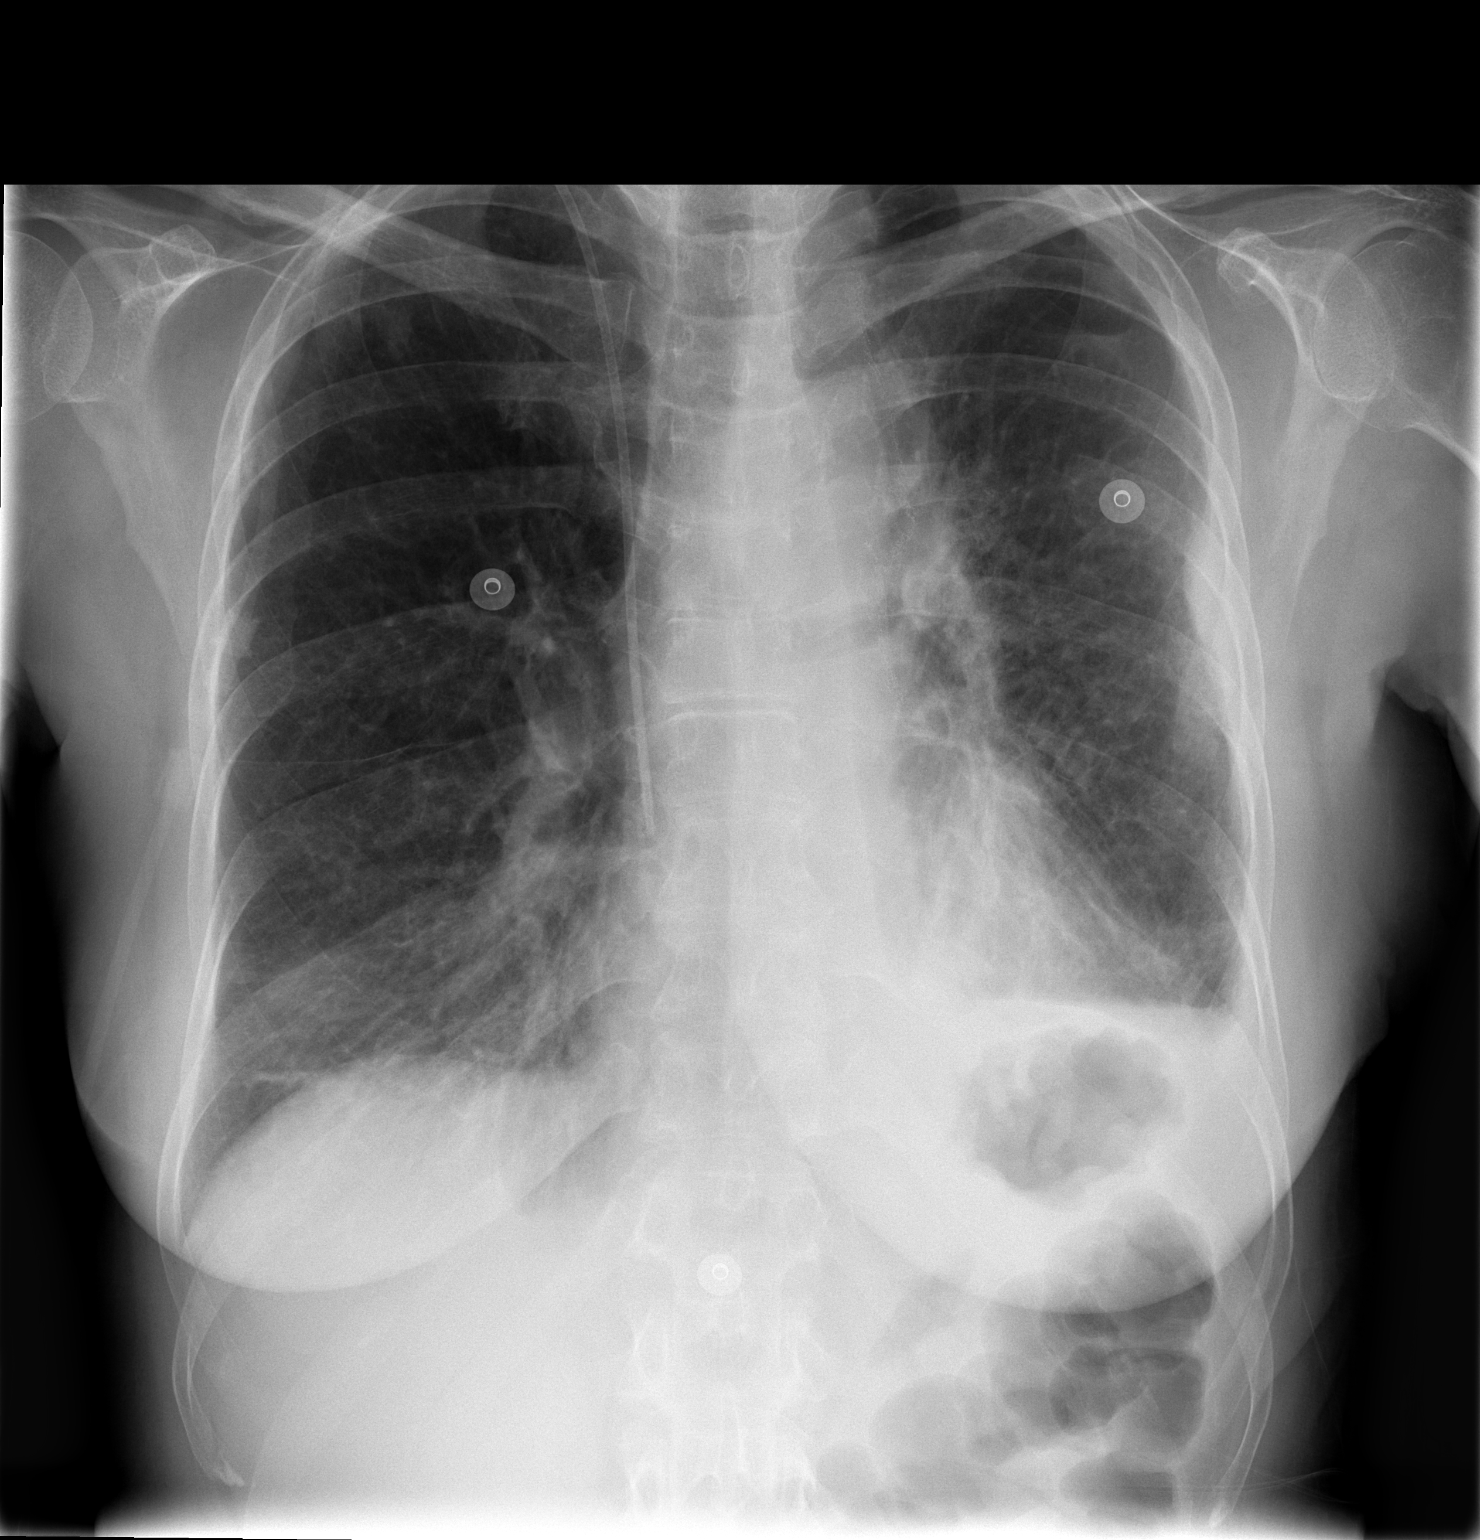

[w chest lat]
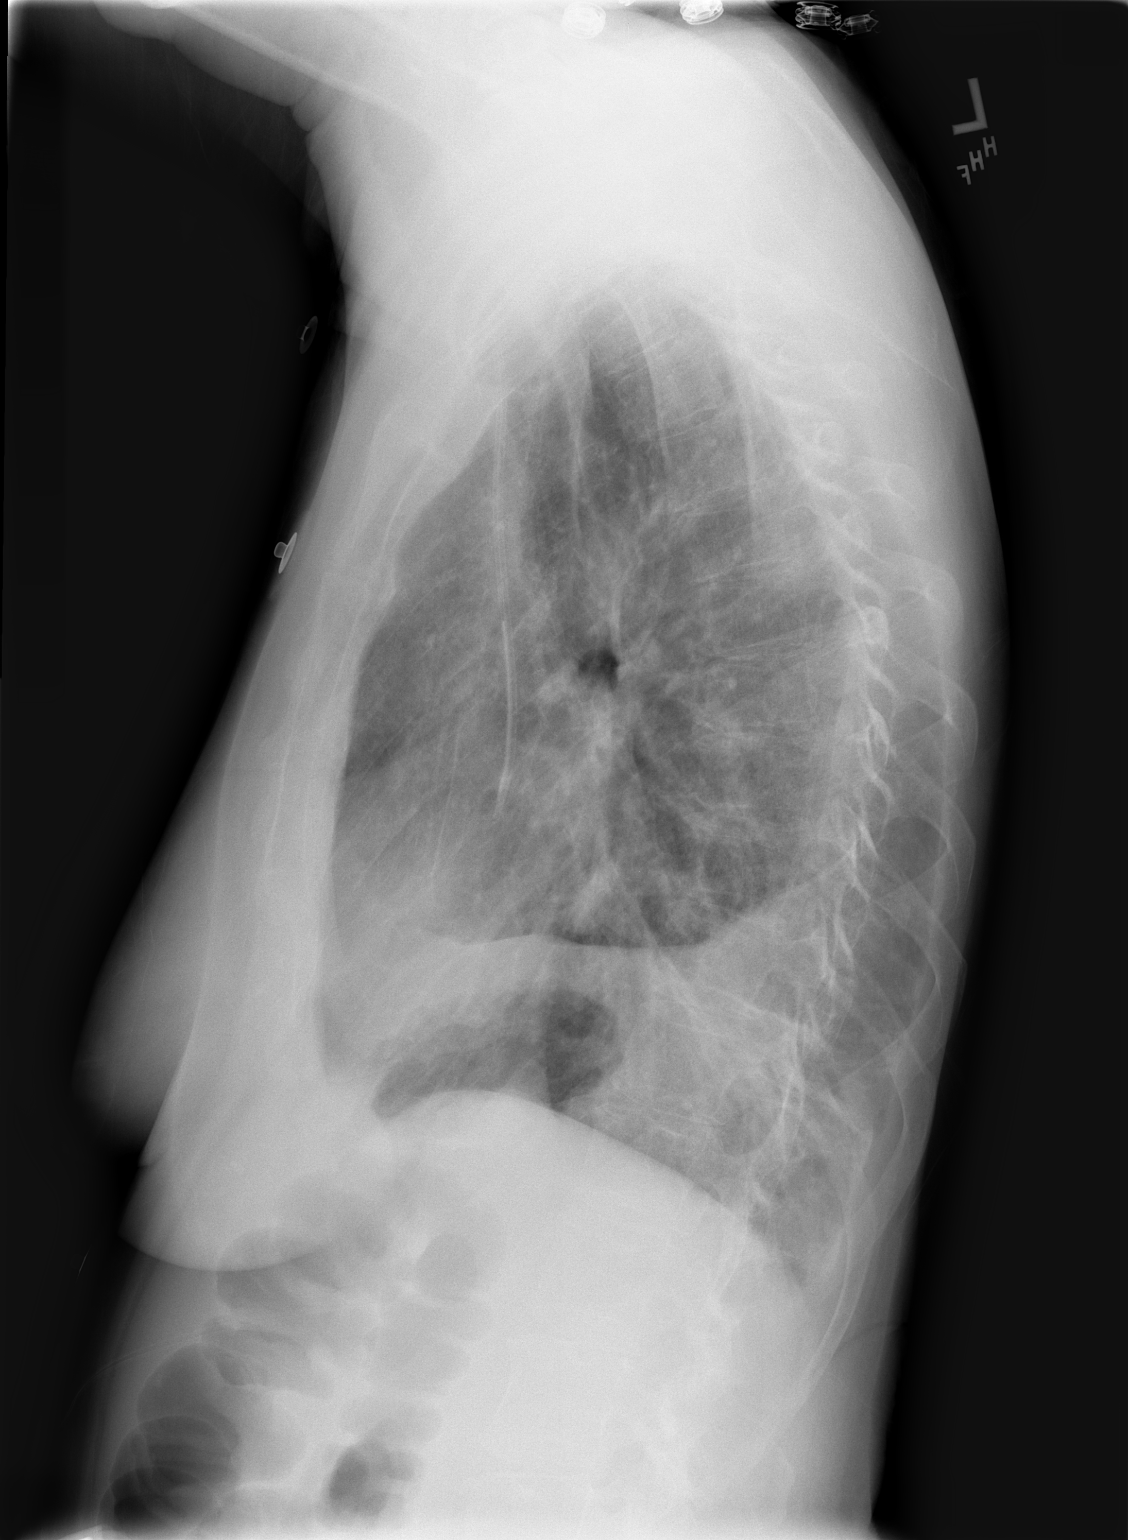

[2 of 2 positions shown; findings below may reference images not displayed]

The right IJ catheter is stable.  There is a persistent small, 10% apical pneumothorax on the left.  Persistent areas of pleural thickening and interstitial fibrosis.  Persistent basilar atelectasis and a small left effusion.
IMPRESSION: 1.  Stable small apical pneumothorax on the left.  
2.  Bibasilar atelectasis and left pleural effusion. 
3.  Interstitial fibrosis.  
4.  Possible right upper lobe lung nodule.

## 2004-07-18 IMAGING — CT CT HEAD WO/W CM
3 series · 17 of 30 positions shown, 19 images · IV contrast (omnipaque)
Comparison: none

CLINICAL DATA: Left lung mass suspicious for malignancy.
 CT OF THE HEAD WITH AND WITHOUT CONTRAST:
 Routine studies were performed before and after the IV infusion of 100 cc of Omnipaque 300.  The report from an MRI of the brain done at [REDACTED] on [DATE] is correlated. That study is not available for direct comparison.
 There is no evidence of acute intracranial hemorrhage, mass effect, or extra-axial fluid collection.  The ventricles and subarachnoid spaces are appropriately sized for age.  Postcontrast images demonstrate no abnormal intracranial enhancement.  There is appropriate vascular enhancement.  No suspicious calvarial lesions are present.  The visualized paranasal sinuses are clear.

[Series 2: brain · axial · 0.47mm/px · z∈[+176,+288]mm · 8 of 28 slices shown, 10 images (1 of 2)]
[im 4/28  brain]
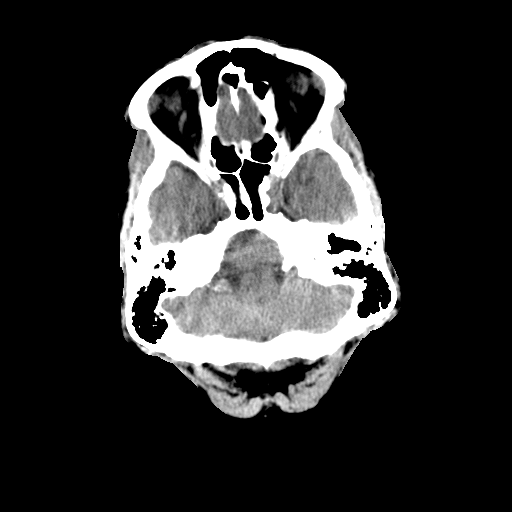
[im 4/28  bone]
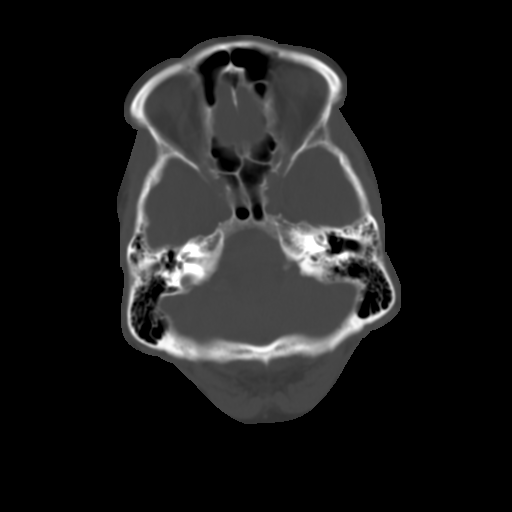
[im 7/28  brain]
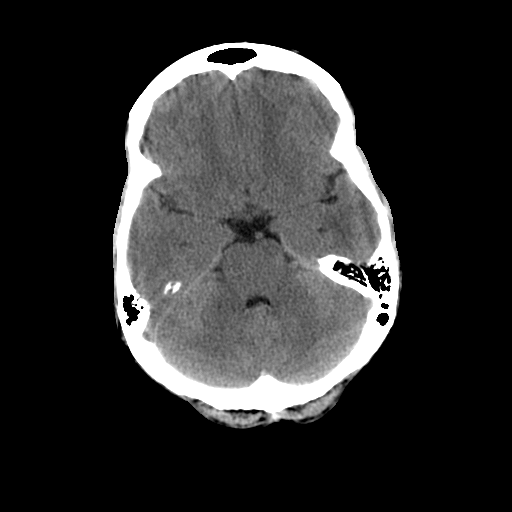
[im 10/28  brain]
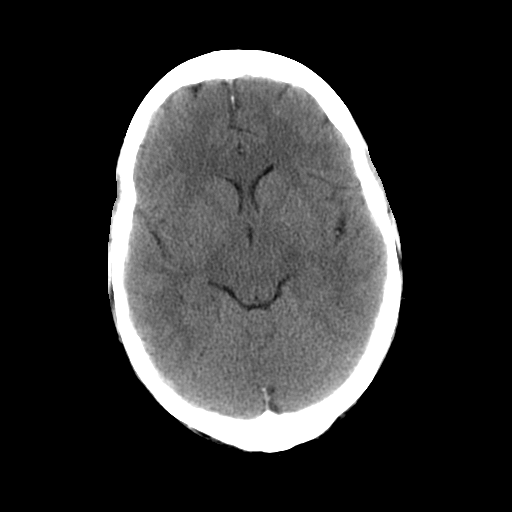
[im 13/28  brain]
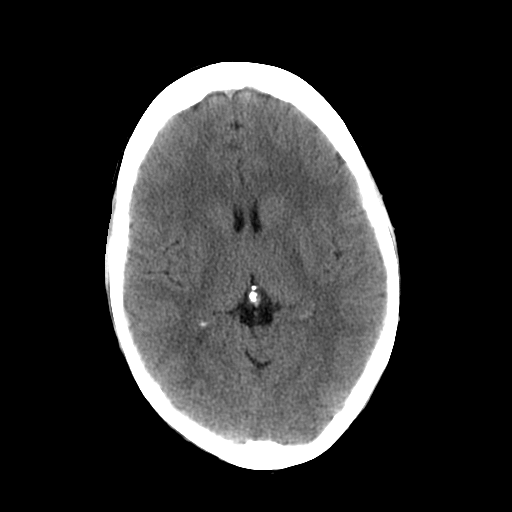
[im 16/28  brain]
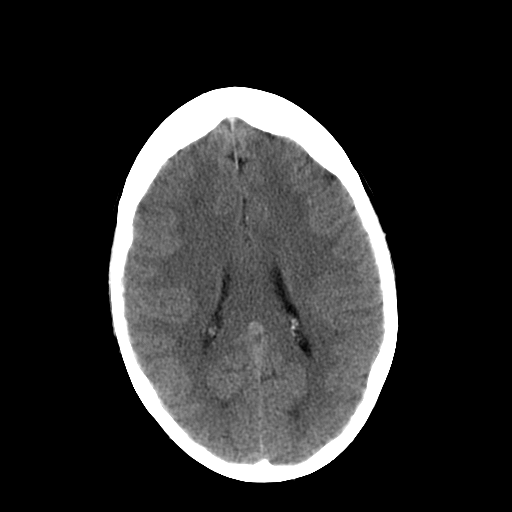
[im 16/28  bone]
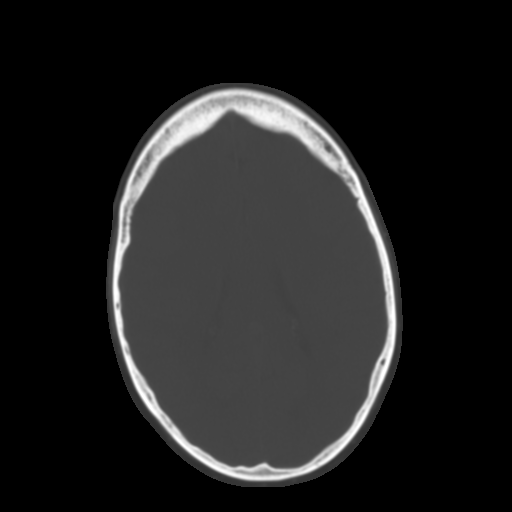
[im 19/28  brain]
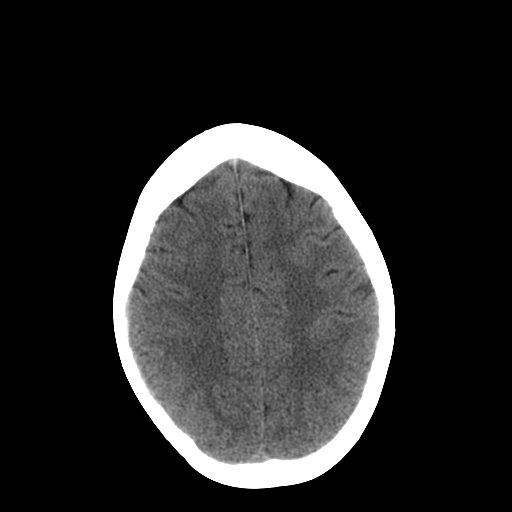
[im 22/28  brain]
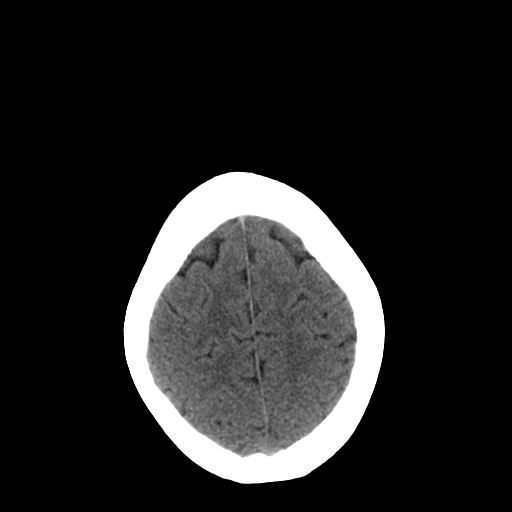
[im 25/28  brain]
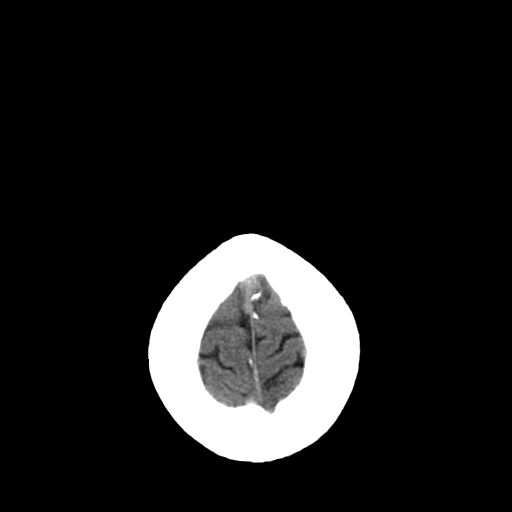

[Series 3: recon 2: brain · axial · 0.47mm/px · z∈[+176,+288]mm · 8 of 28 slices shown]
[im 4/28  brain]
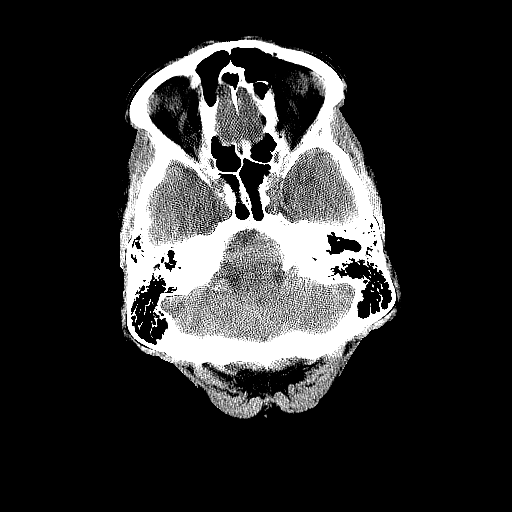
[im 7/28  brain]
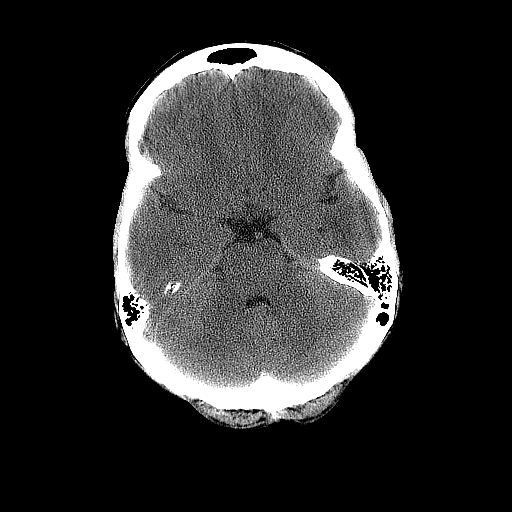
[im 10/28  brain]
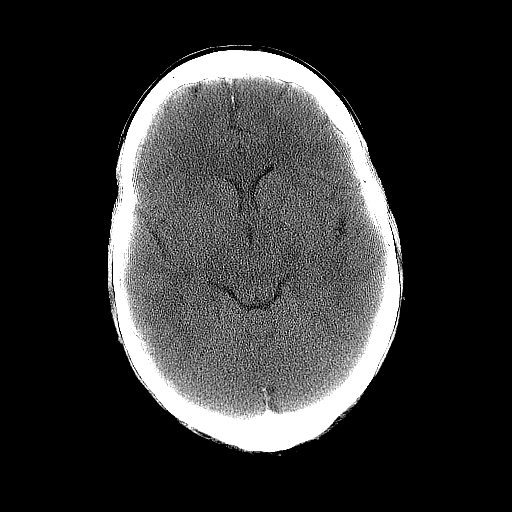
[im 13/28  brain]
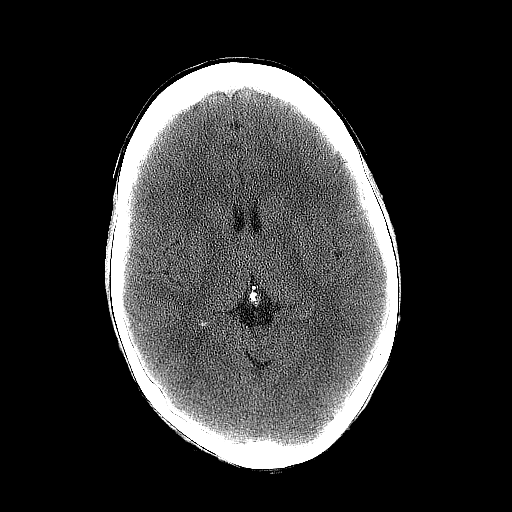
[im 16/28  brain]
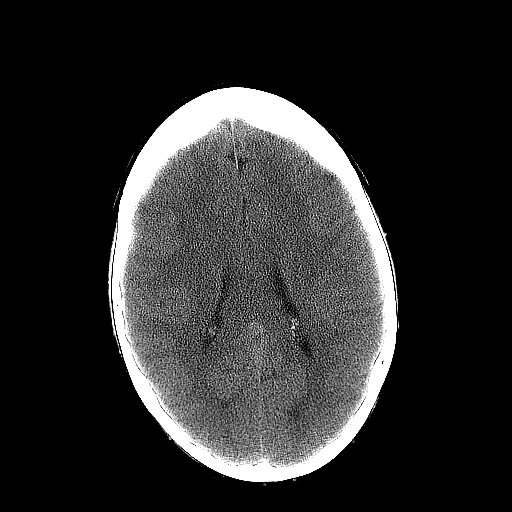
[im 19/28  brain]
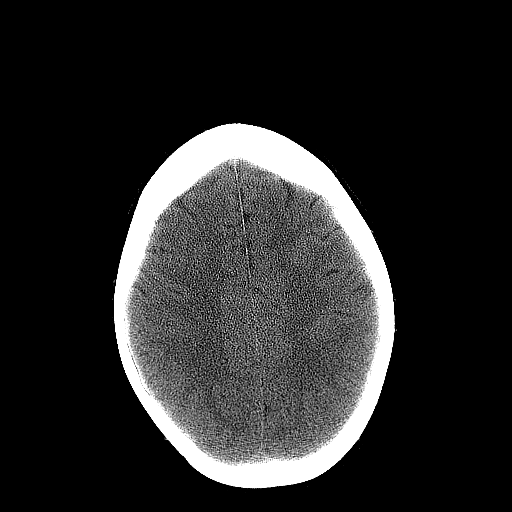
[im 22/28  brain]
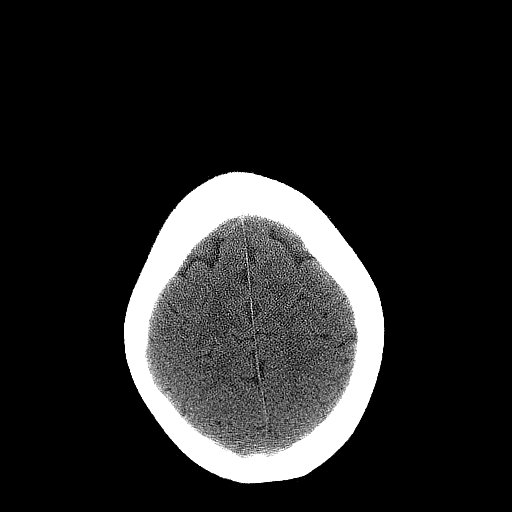
[im 25/28  brain]
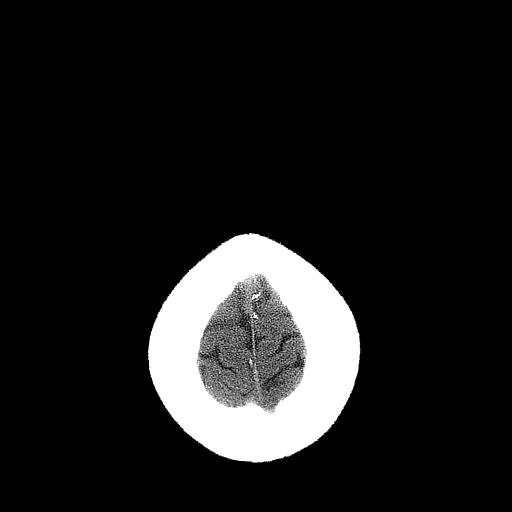

[Series 4: brain · axial · 0.47mm/px · 1 of 28 slices shown (2 of 2)]
[im 4/28  brain]
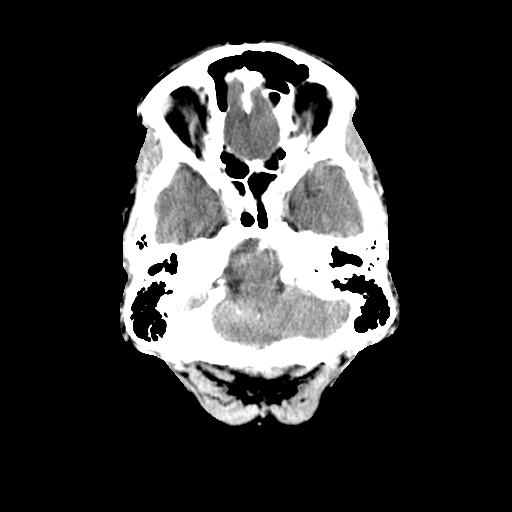

[17 of 30 positions shown; findings below may reference images not displayed]

IMPRESSION: No evidence of intracranial or calvarial metastatic disease.  No acute intracranial findings.

## 2004-07-19 ENCOUNTER — Ambulatory Visit: Payer: Self-pay | Admitting: Internal Medicine

## 2004-07-24 ENCOUNTER — Ambulatory Visit: Payer: Self-pay | Admitting: Pulmonary Disease

## 2004-07-26 ENCOUNTER — Encounter: Admission: RE | Admit: 2004-07-26 | Discharge: 2004-07-26 | Payer: Self-pay | Admitting: Thoracic Surgery

## 2004-07-26 IMAGING — CR DG CHEST 2V
2 series · 2 of 2 positions shown · non-contrast
Comparison: [DATE].

CLINICAL DATA: Followup partial left lobectomy for lung cancer on [DATE].
Followup pneumothorax.

CHEST - 2 VIEW

[view not recorded (1 of 2)]
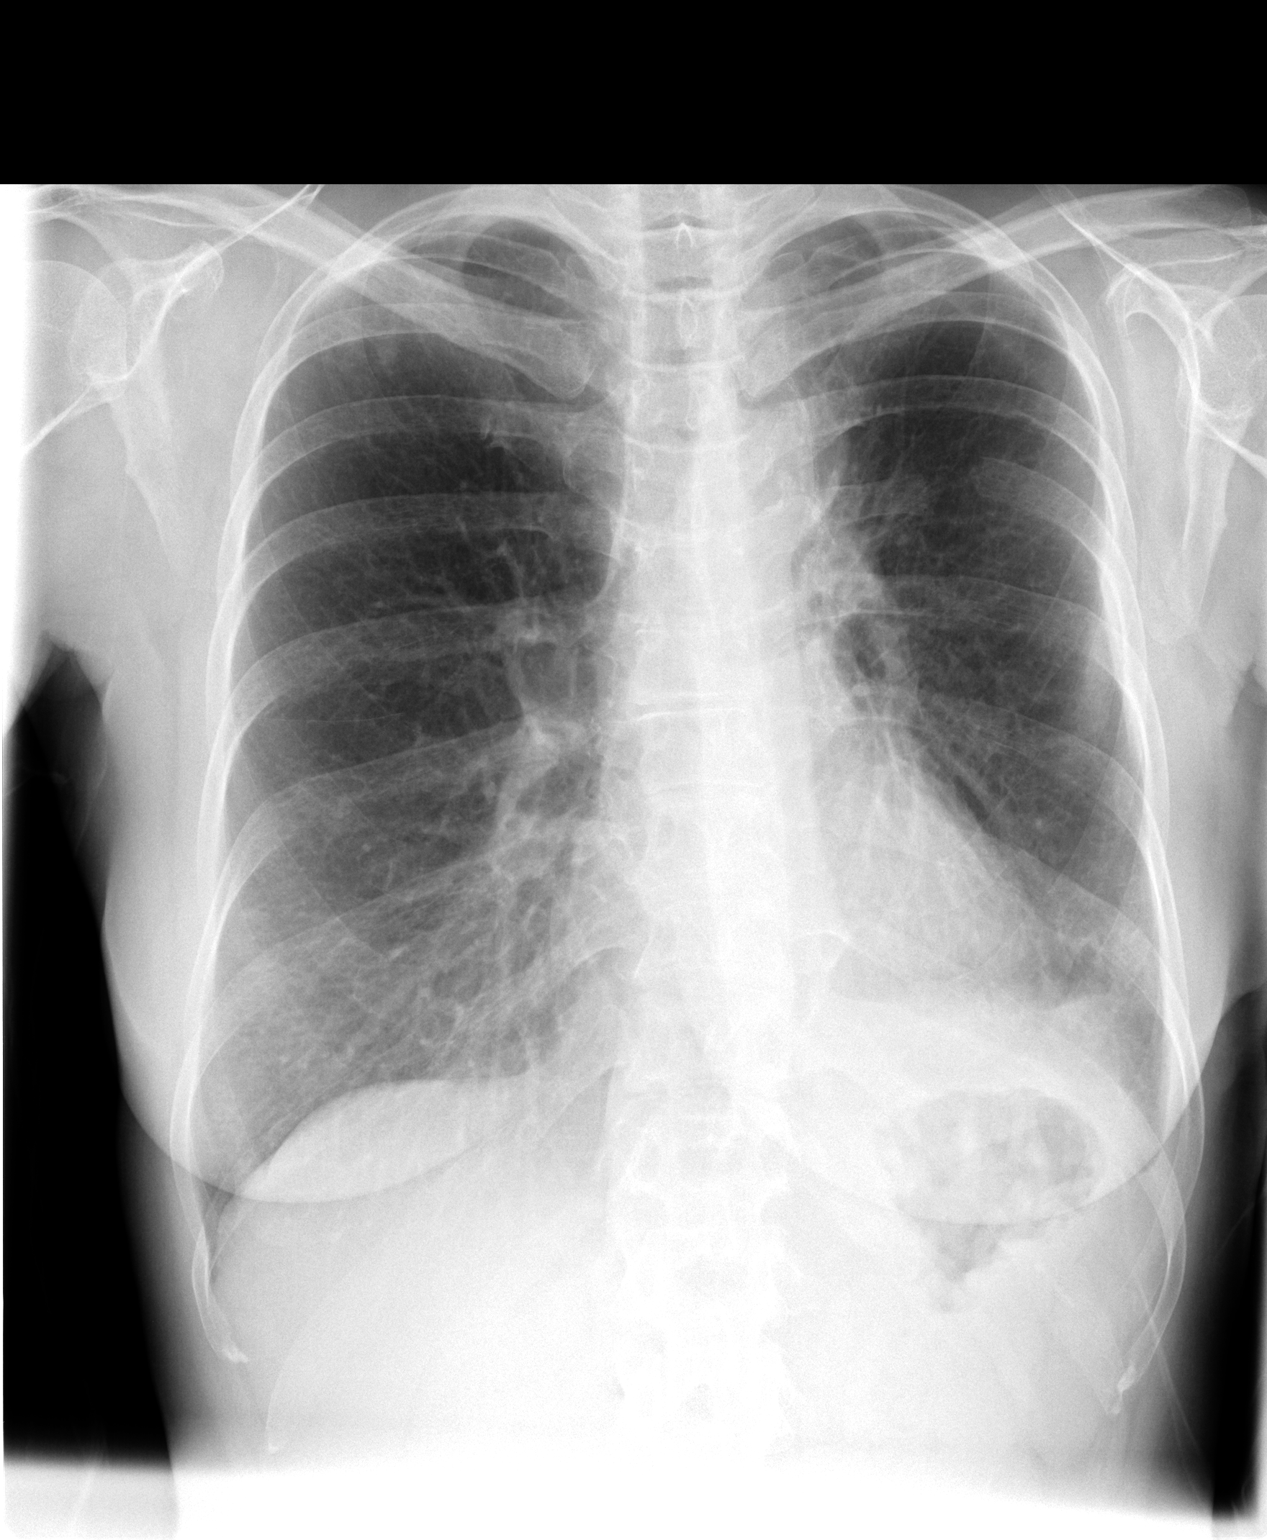

[view not recorded (2 of 2)]
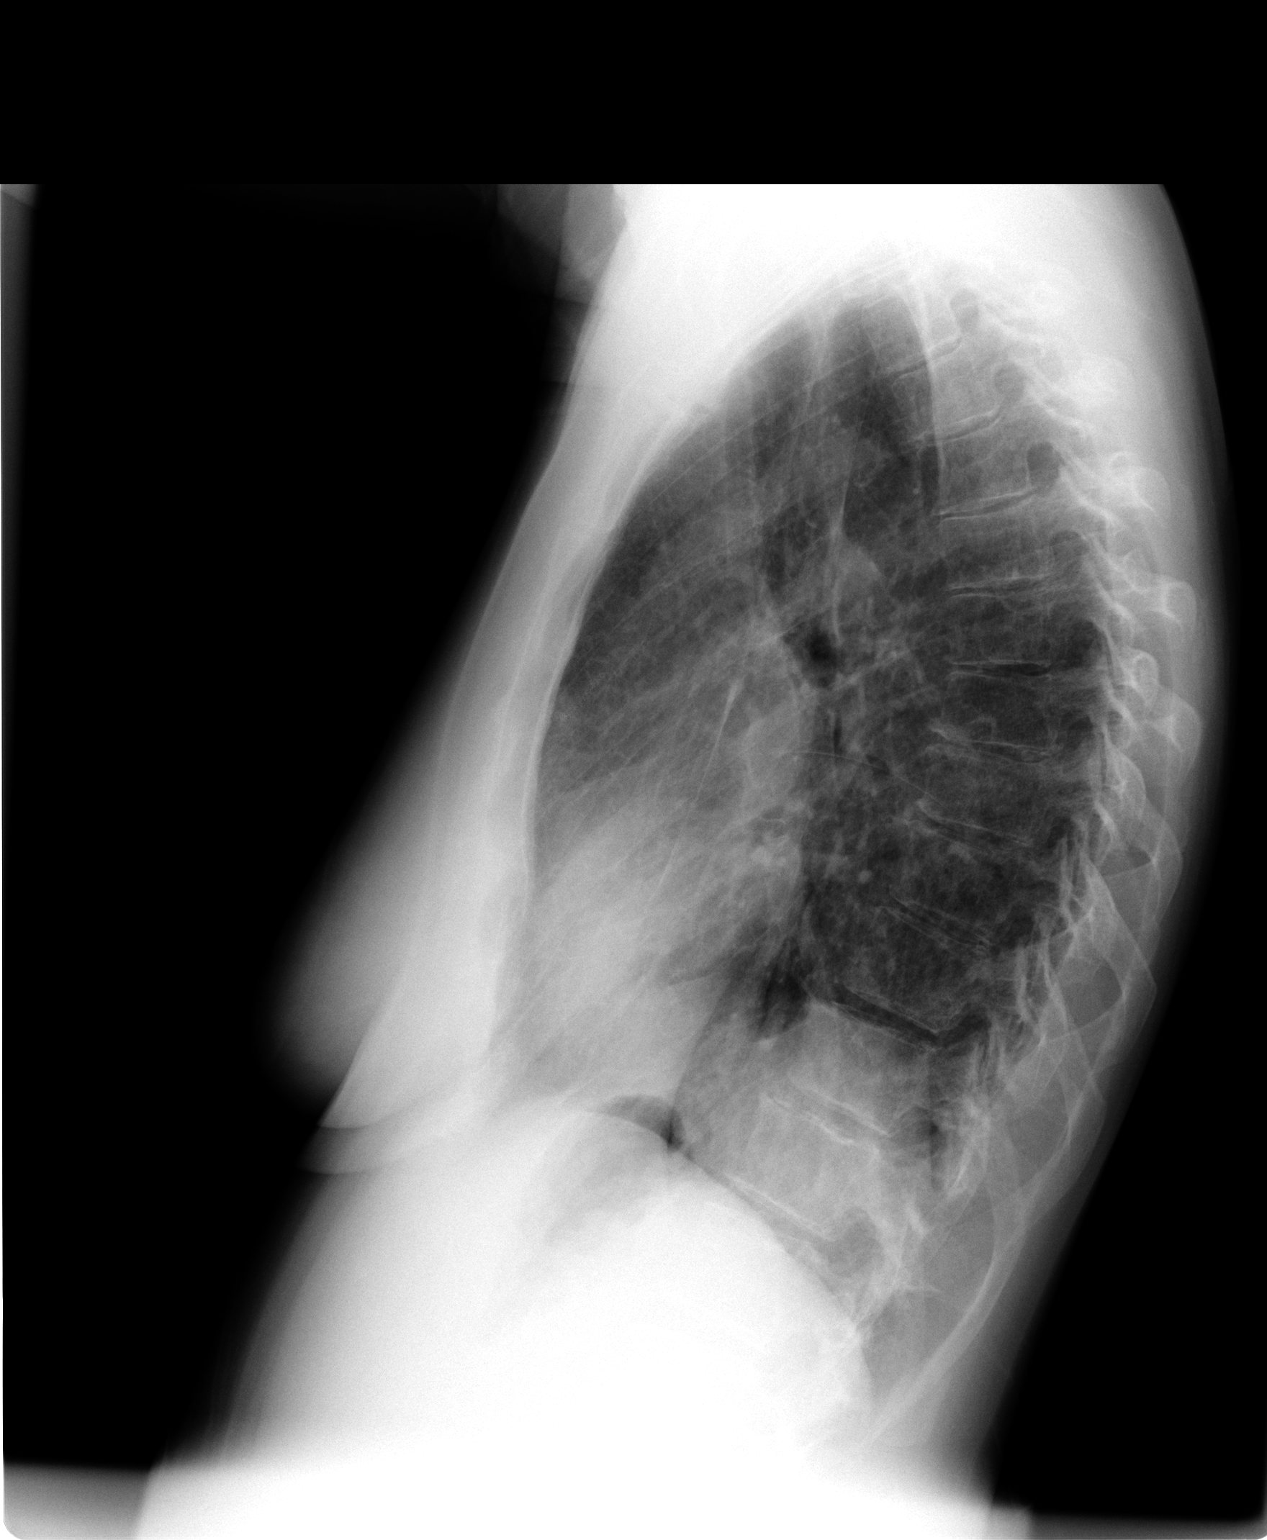

[2 of 2 positions shown; findings below may reference images not displayed]

FINDINGS: The previously demonstrated left apical pneumothorax is slightly
smaller and slightly higher in density. The right jugular catheter has been
removed. The previously noted small right upper lobe nodule is unchanged. Stable
postoperative changes on the left with mild elevation of the left hemidiaphragm.
Stable changes of COPD. Normal sized heart. Diffuse osteopenia. Mild scoliosis.
Minimal thoracic spine degenerative changes.

IMPRESSION

1. Slight decrease in size of the small left apical pneumothorax with interval
development of a small amount of fluid within the pleural space at that
location.

2. Stable right upper lobe nodule. 

3. Stable left postoperative changes and stable changes of COPD.

## 2004-08-16 ENCOUNTER — Encounter: Admission: RE | Admit: 2004-08-16 | Discharge: 2004-08-16 | Payer: Self-pay | Admitting: Thoracic Surgery

## 2004-08-16 IMAGING — CR DG CHEST 2V
2 series · 2 of 2 positions shown · non-contrast
Comparison: none

CLINICAL DATA: Prior left thoracotomy.  Small pneumothorax noted on prior exam.  COPD.  Right upper lobe nodular density.  Patient still has some soreness in the chest. 
CHEST - 2 VIEW:
Elevation of the left hilum associated with postoperative changes.  Surgical staple rows medial aspect of the left upper chest and hilum.  Defect posterior aspect of the left sixth rib secondary to thoracotomy.  Small left pleural effusion. Improved aeration left base.  No pneumothorax.  Faint ill - defined nodular densities right apex unchanged.  Chronic peribronchial changes.

[view not recorded (1 of 2)]
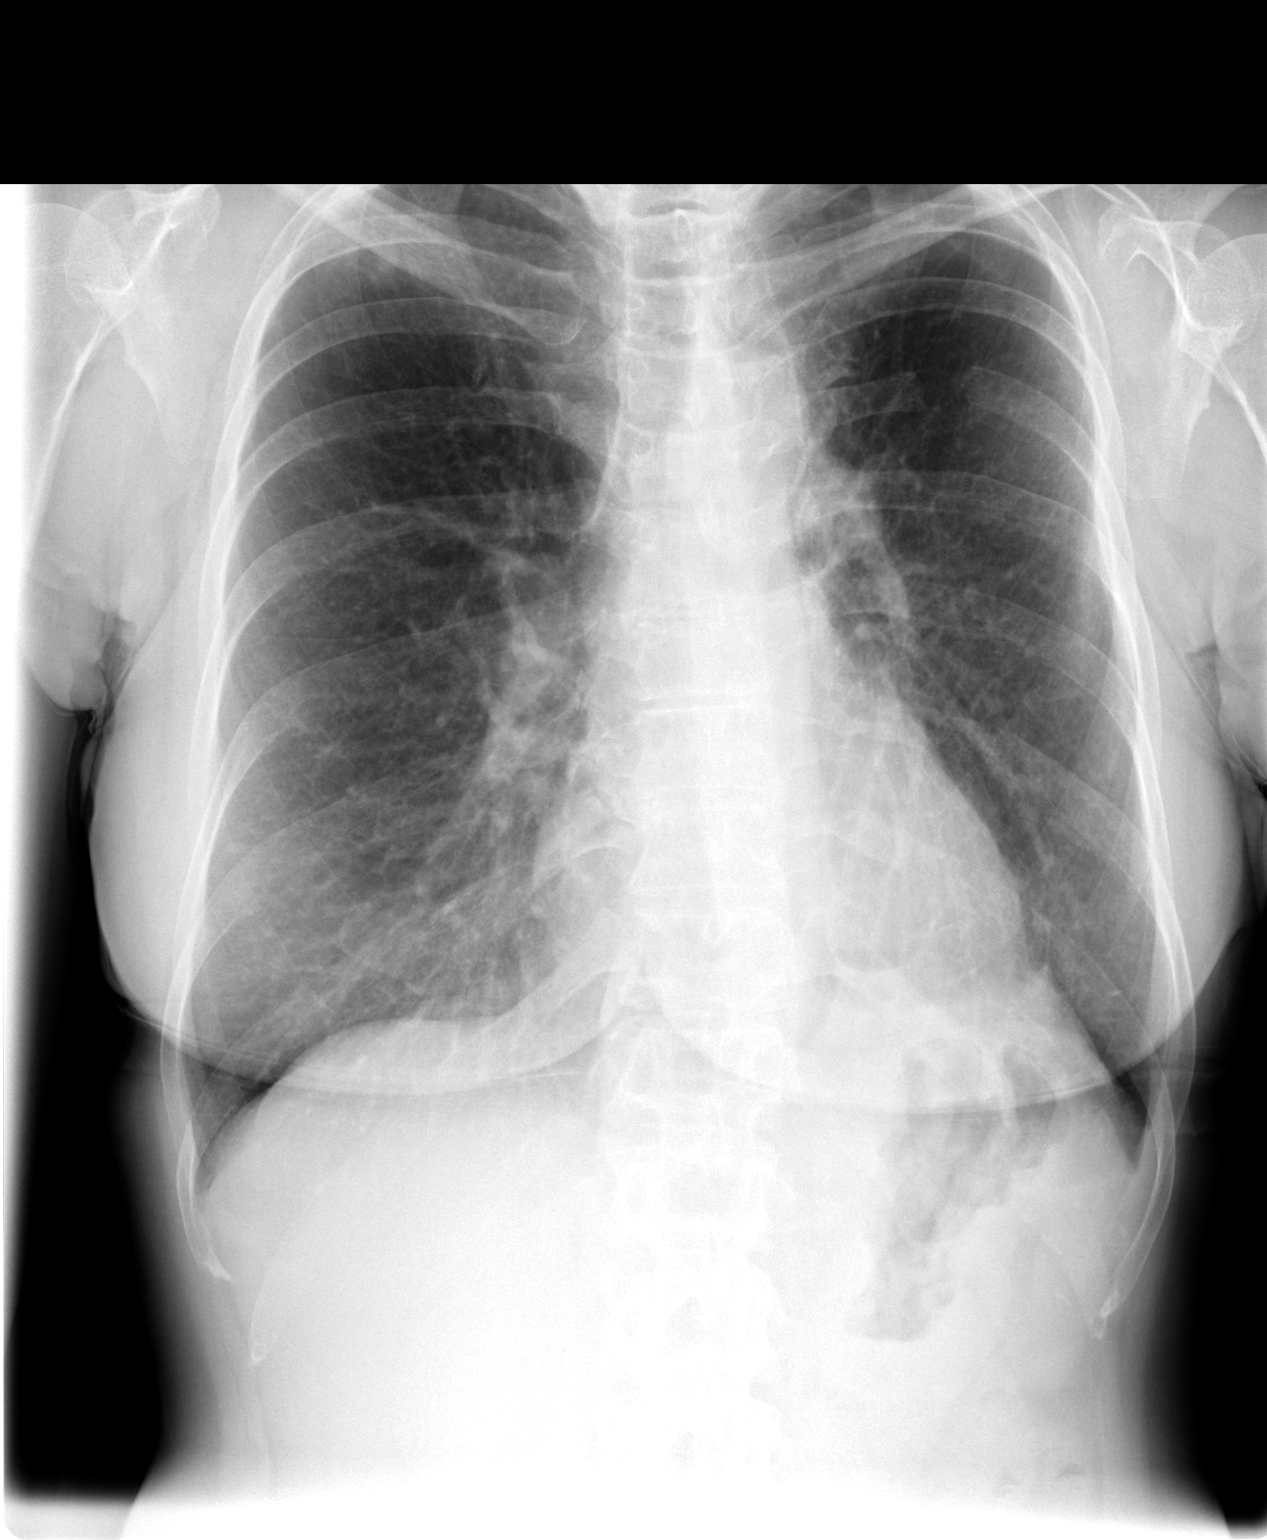

[view not recorded (2 of 2)]
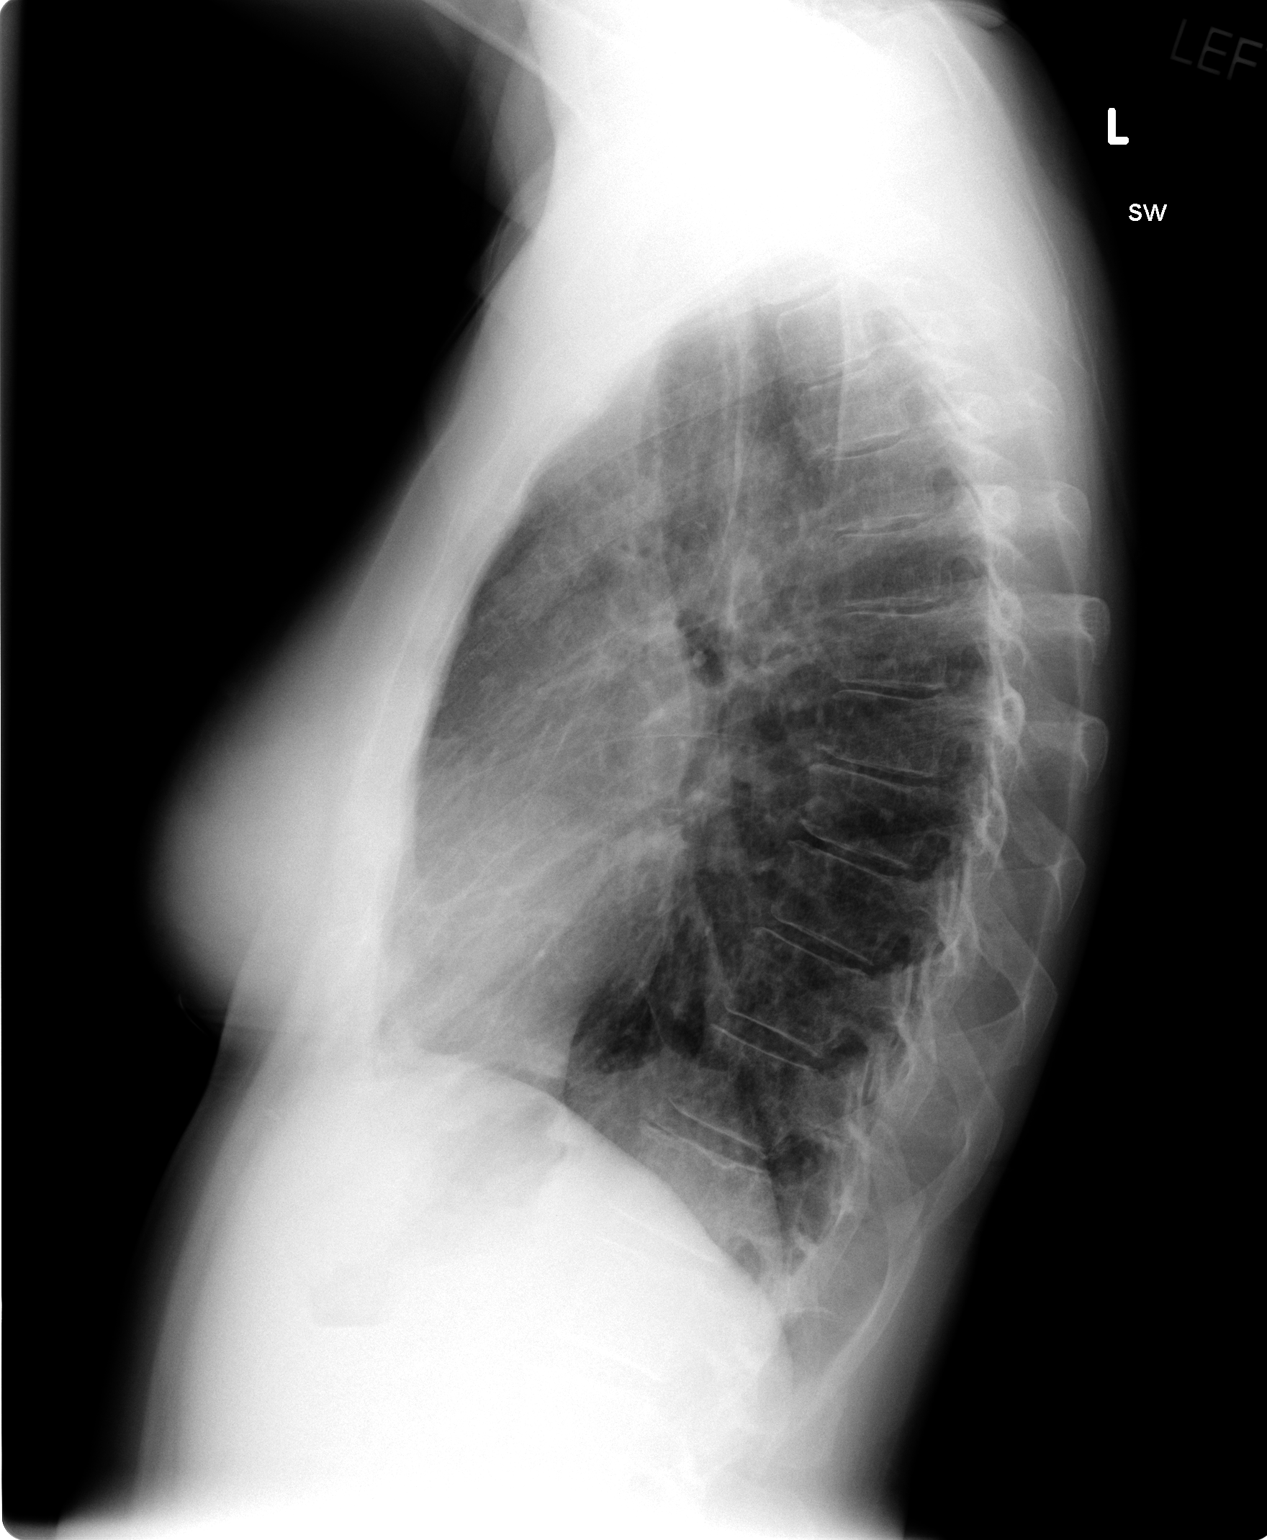

[2 of 2 positions shown; findings below may reference images not displayed]

IMPRESSION: No pneumothorax.  Postoperative changes.  COPD.  See comments above.

## 2004-09-04 ENCOUNTER — Ambulatory Visit: Payer: Self-pay | Admitting: Internal Medicine

## 2004-09-17 ENCOUNTER — Ambulatory Visit (HOSPITAL_COMMUNITY): Admission: RE | Admit: 2004-09-17 | Discharge: 2004-09-17 | Payer: Self-pay | Admitting: Hematology and Oncology

## 2004-09-18 ENCOUNTER — Inpatient Hospital Stay (HOSPITAL_COMMUNITY): Admission: RE | Admit: 2004-09-18 | Discharge: 2004-09-20 | Payer: Self-pay | Admitting: Hematology and Oncology

## 2004-09-18 ENCOUNTER — Ambulatory Visit: Payer: Self-pay | Admitting: Internal Medicine

## 2004-09-18 IMAGING — CR DG CHEST 2V
2 series · 2 of 2 positions shown · non-contrast
Comparison: [DATE]

CHEST - 2 VIEW:

CLINICAL DATA: Fever. Large cell lung cancer.

[w chest pa]
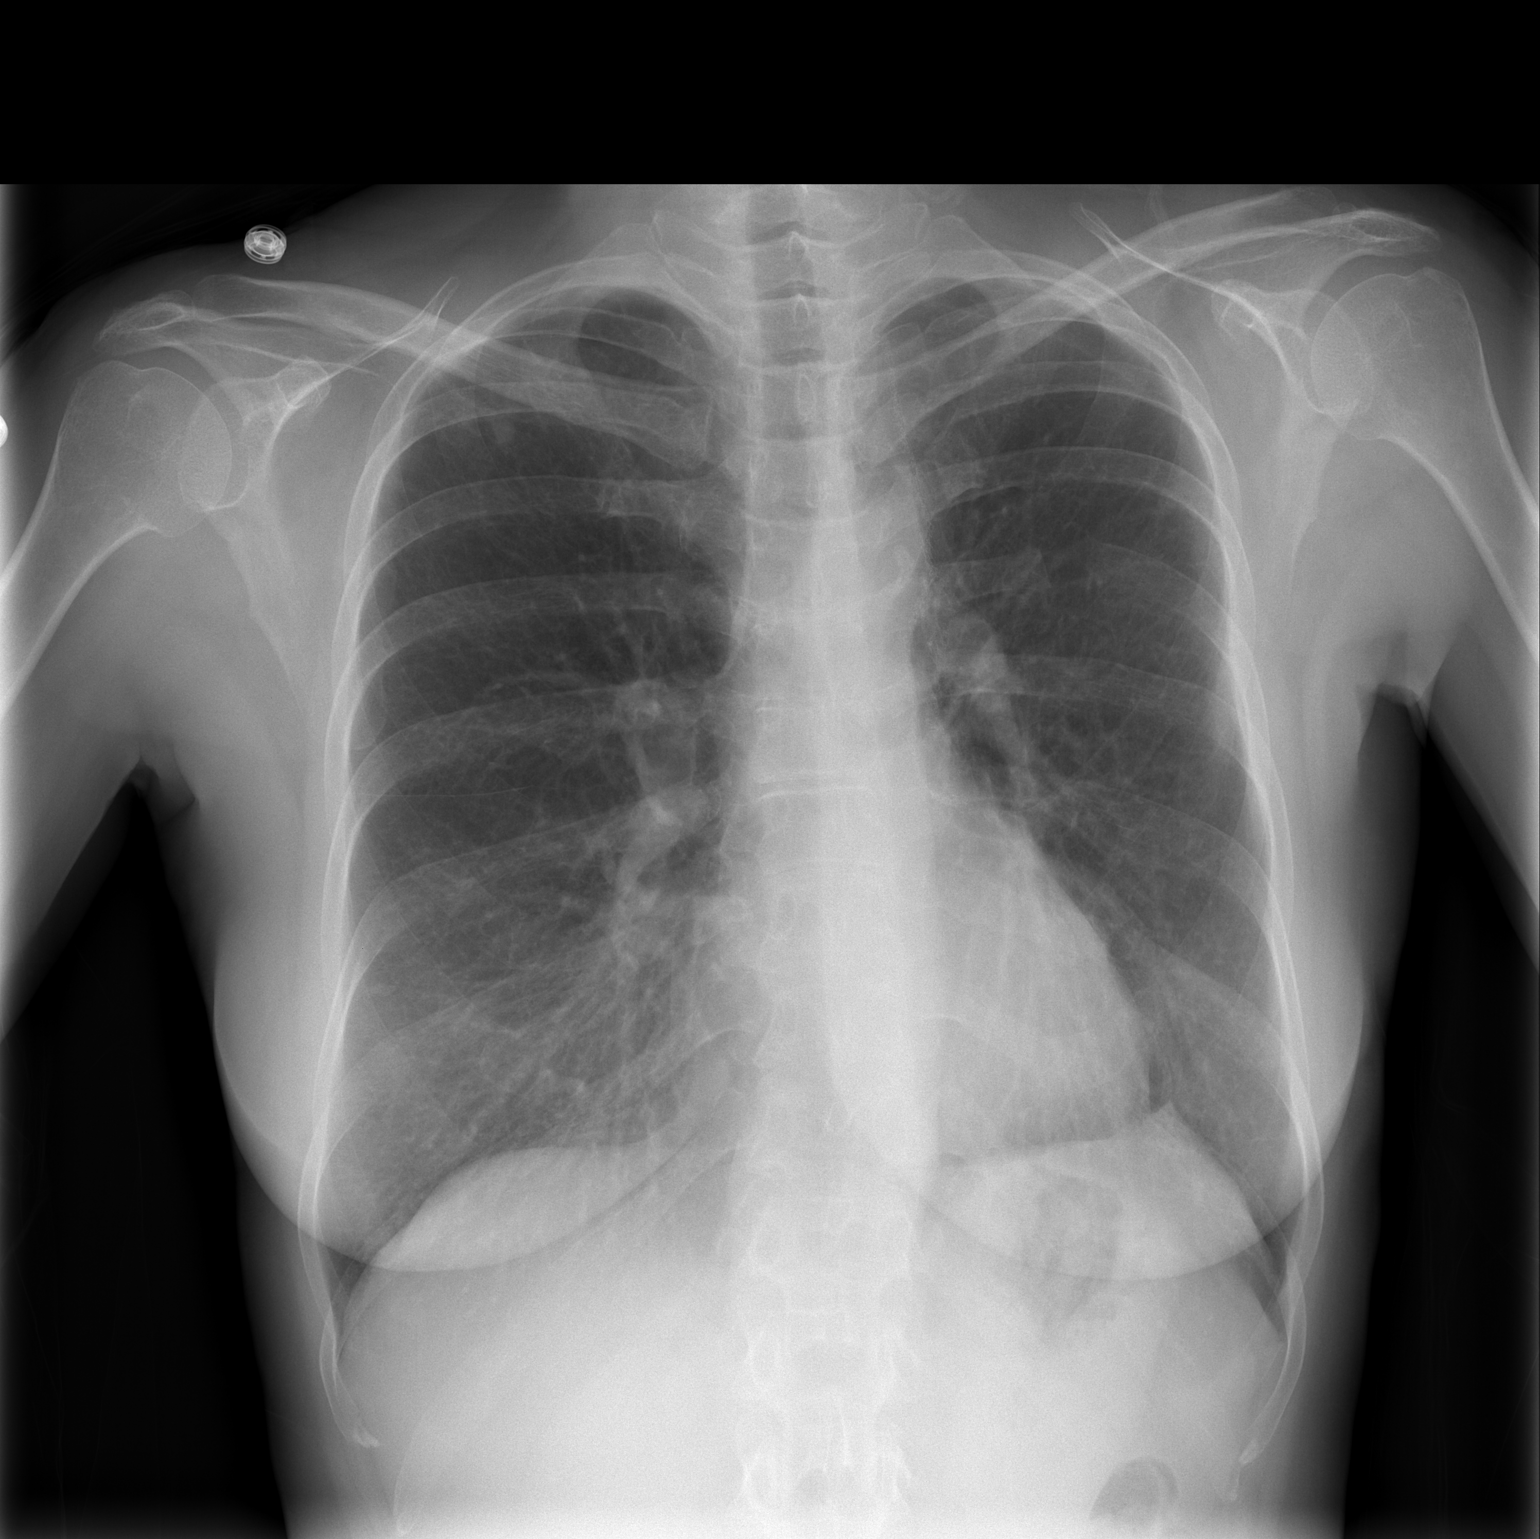

[w chest lat]
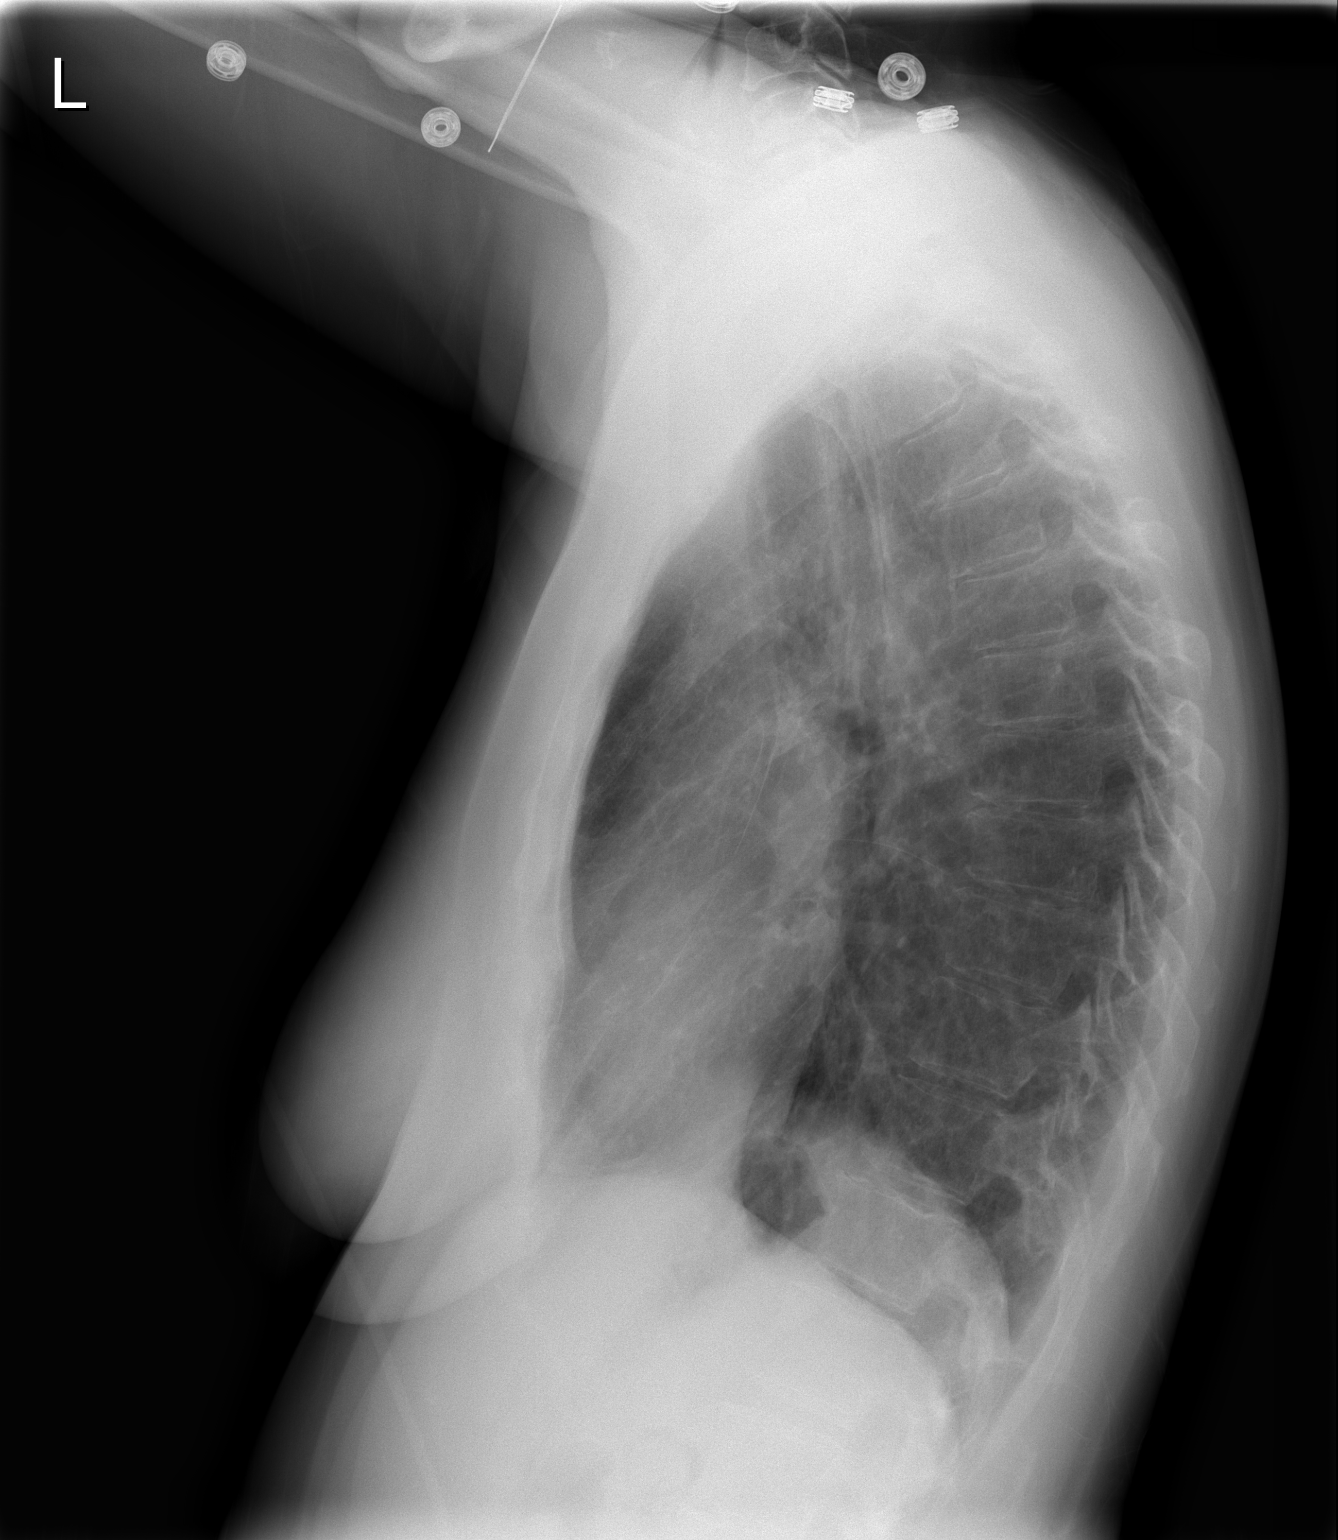

[2 of 2 positions shown; findings below may reference images not displayed]

FINDINGS: Lungs remain hyperinflated. Ill-defined opacity in the right upper
lobe is unchanged. There is no evidence for focal infiltrate, pulmonary edema,
or pleural effusion. The heart size is normal. Bones are diffusely osteopenic.
IMPRESSION: Stable exam. Hyperinflation with stable ill-defined opacity in the right upper
lobe.

## 2004-10-11 ENCOUNTER — Encounter: Admission: RE | Admit: 2004-10-11 | Discharge: 2004-10-11 | Payer: Self-pay | Admitting: Thoracic Surgery

## 2004-10-11 IMAGING — CR DG CHEST 2V
2 series · 2 of 2 positions shown · non-contrast
Comparison: none

CLINICAL DATA: Lung carcinoma, follow up.
CHEST X-RAY:
Two views of the chest are compared to a chest x-ray of [DATE].  Nodular opacity in the right upper lobe appears stable and may be due to scarring. The lungs are otherwise clear.  The heart is within normal limits in size.

[view not recorded (1 of 2)]
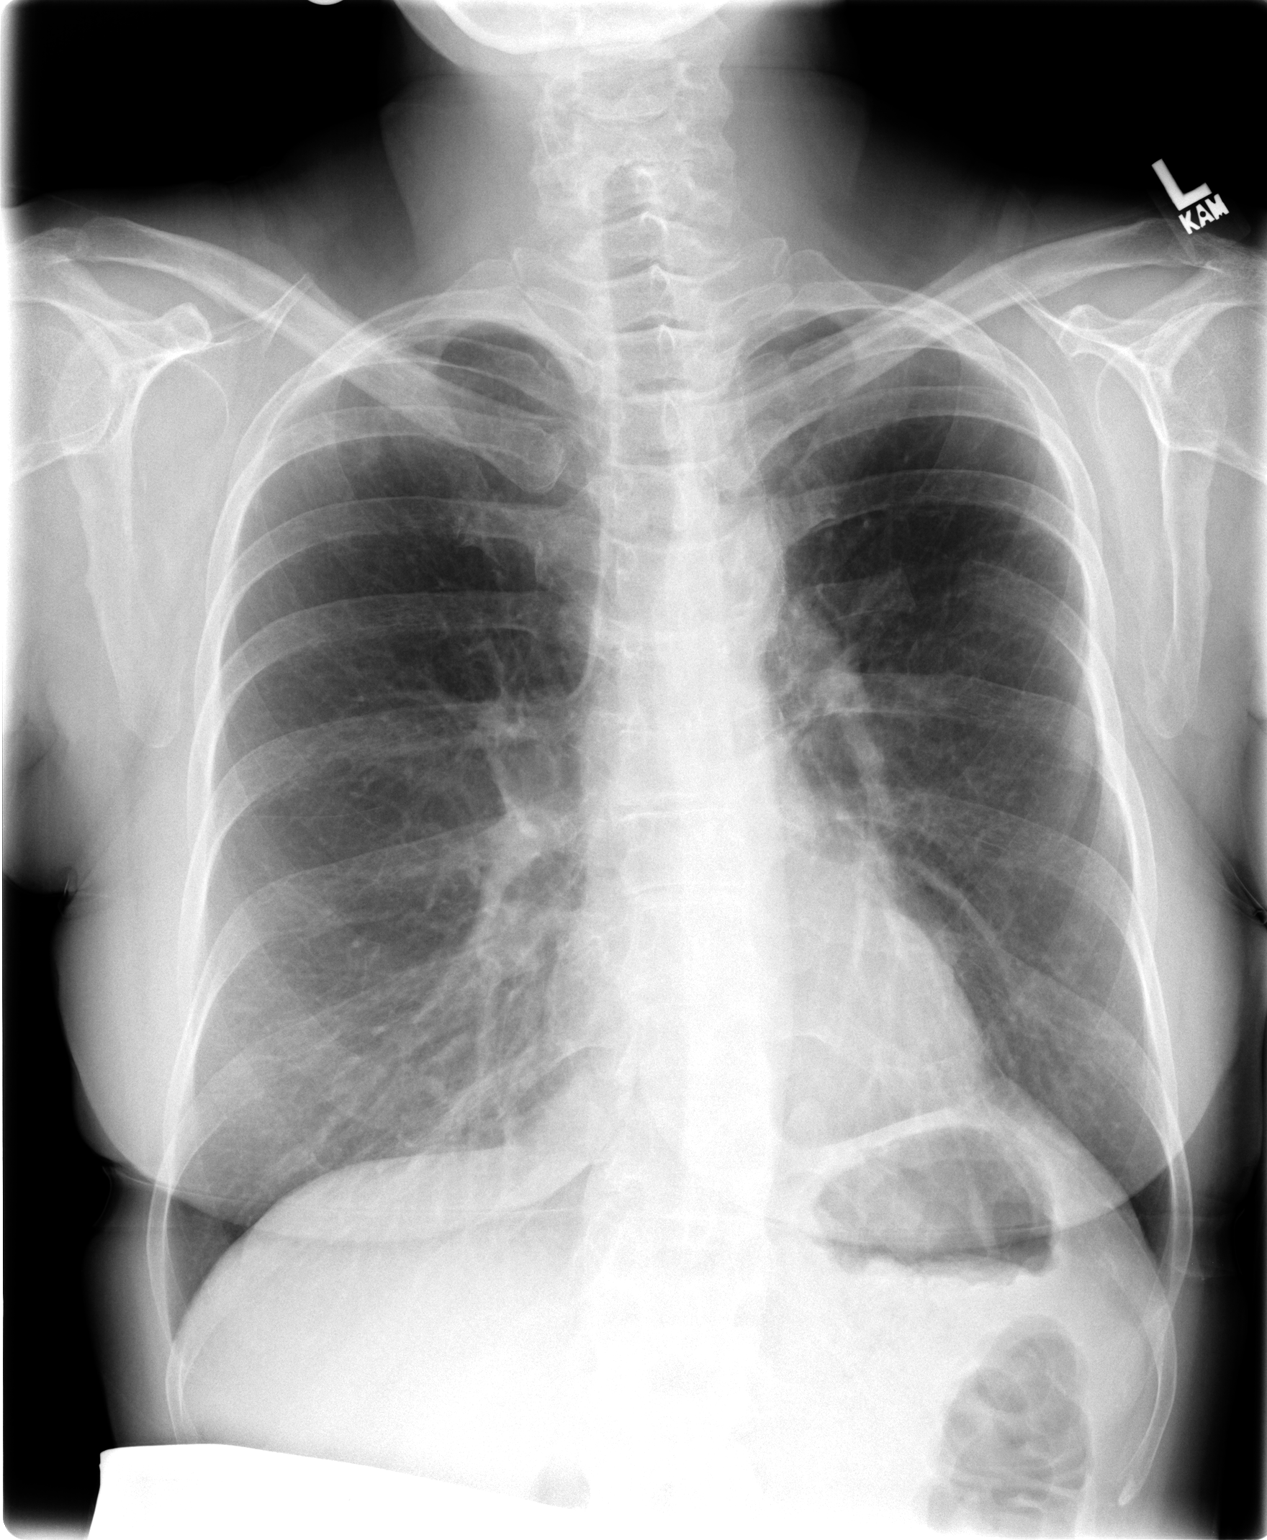

[view not recorded (2 of 2)]
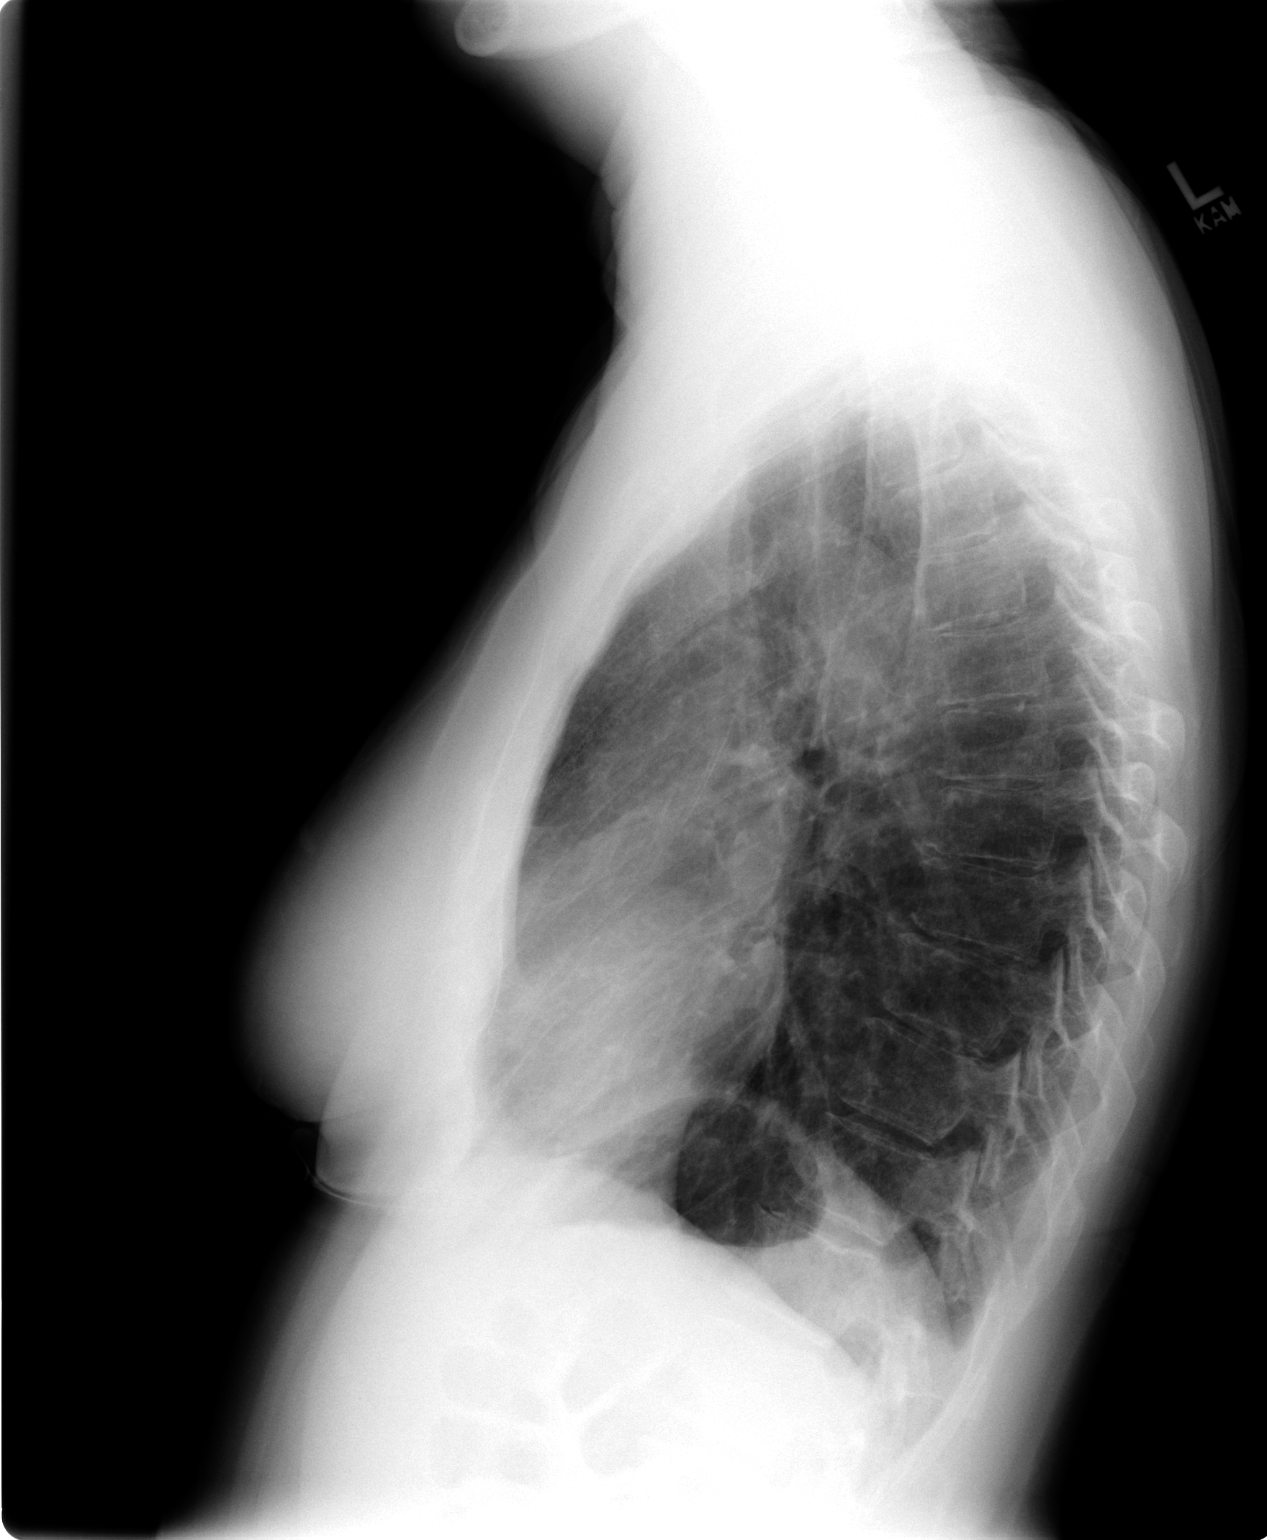

[2 of 2 positions shown; findings below may reference images not displayed]

IMPRESSION: No change in nodular opacity in right upper lobe.  No active lung disease.

## 2004-10-23 ENCOUNTER — Ambulatory Visit: Payer: Self-pay | Admitting: Internal Medicine

## 2004-11-16 ENCOUNTER — Ambulatory Visit (HOSPITAL_COMMUNITY): Admission: RE | Admit: 2004-11-16 | Discharge: 2004-11-16 | Payer: Self-pay | Admitting: Internal Medicine

## 2004-11-16 IMAGING — CT CT CHEST W/ CM
1 of 2 series · 14 of 30 positions shown, 18 images · IV contrast (omnipaque)
Comparison: PET-CT [DATE]

CLINICAL DATA: Lung cancer history of left upper lobectomy

CHEST CT WITH CONTRAST
TECHNIQUE: Multidetector CT imaging of the chest was performed following the
standard protocol during bolus administration of intravenous contrast.
Contrast:  100 cc Omnipaque 300

[Series 2: chest routine 5.0 b30f · axial · 0.63mm/px · z∈[-292,-32]mm · 14 of 62 slices shown, 18 images]
[im 5/62  mediastinal]
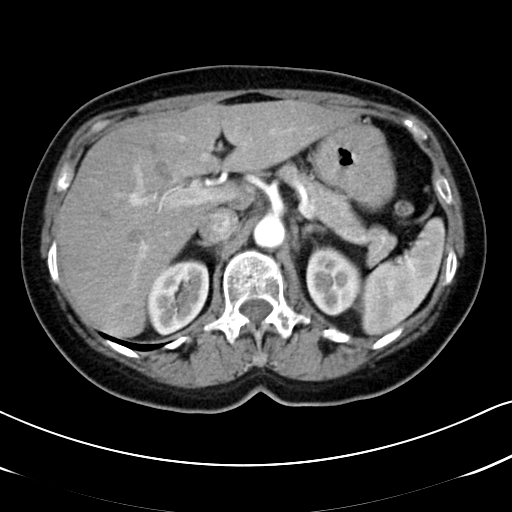
[im 5/62  lung]
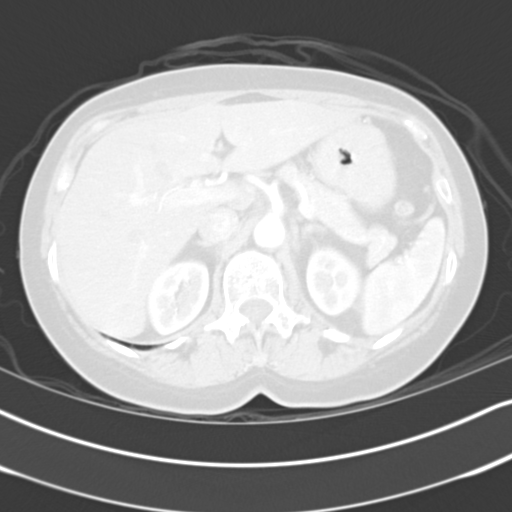
[im 9/62  lung]
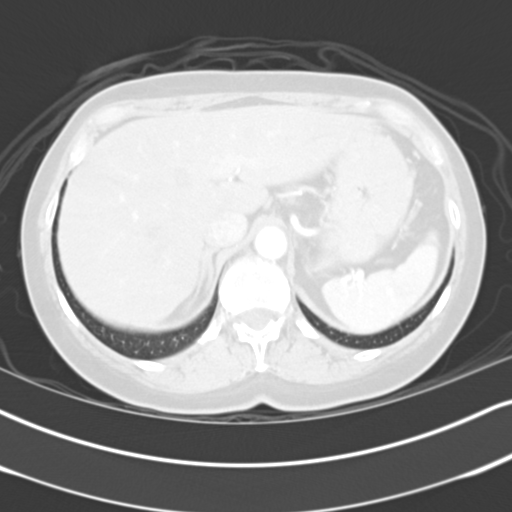
[im 14/62  lung]
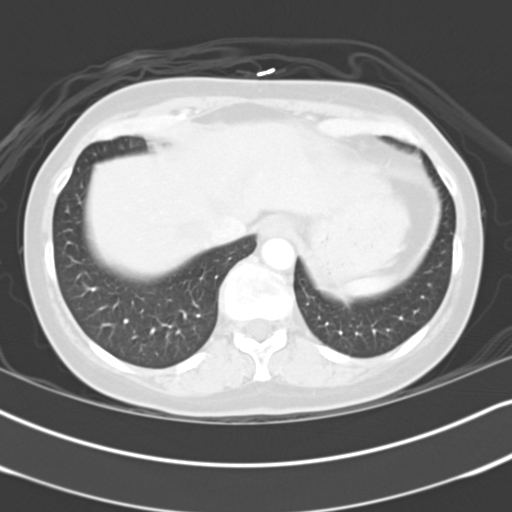
[im 18/62  lung]
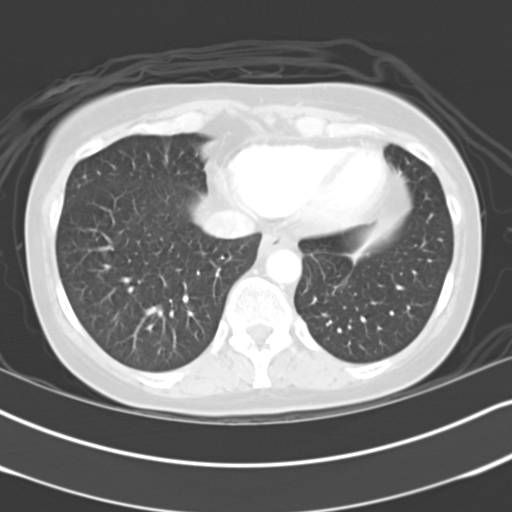
[im 22/62  mediastinal]
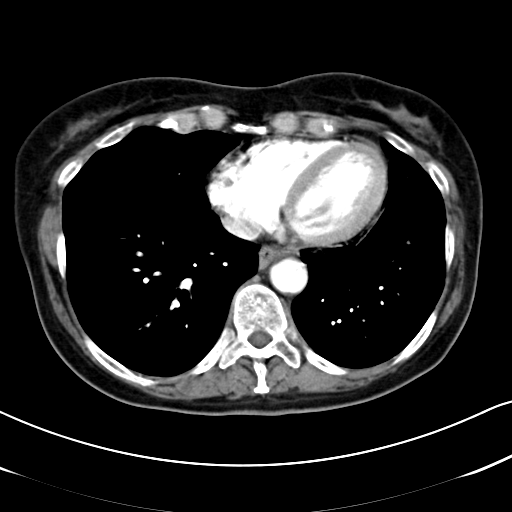
[im 22/62  lung]
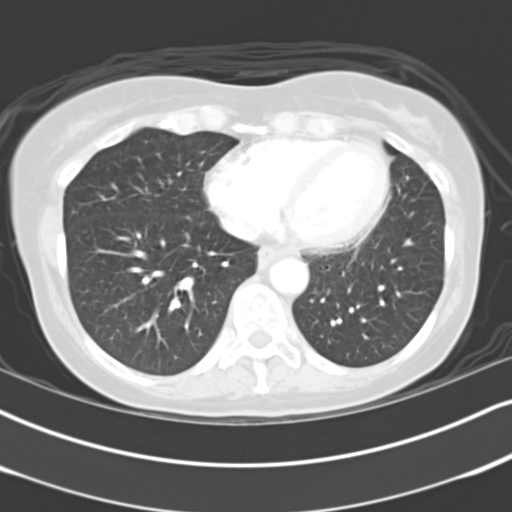
[im 27/62  lung]
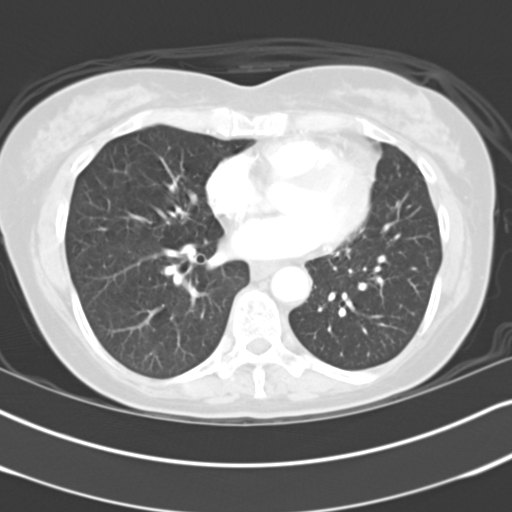
[im 30/62  lung]
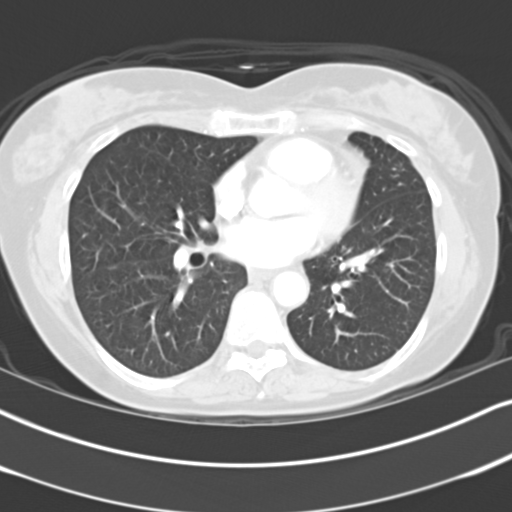
[im 31/62  lung]
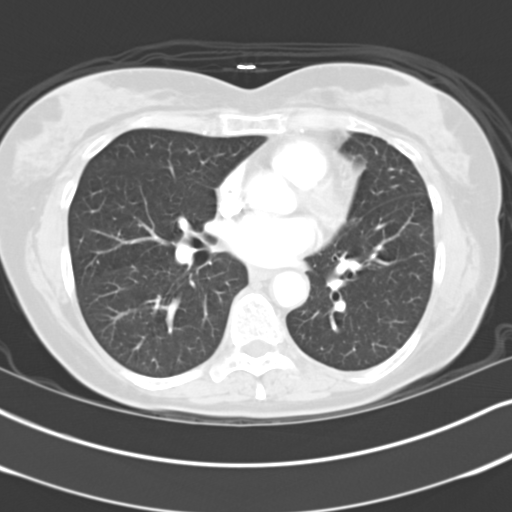
[im 35/62  mediastinal]
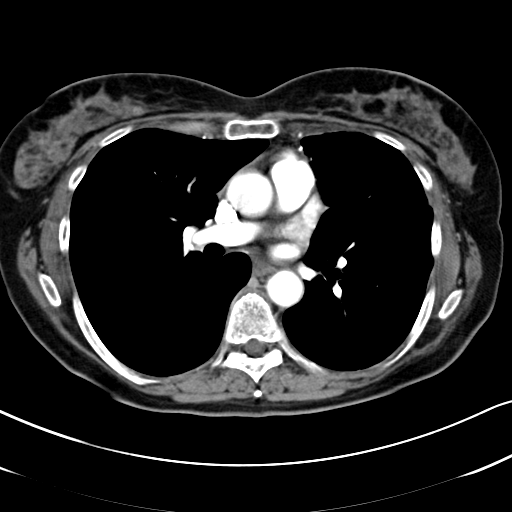
[im 35/62  lung]
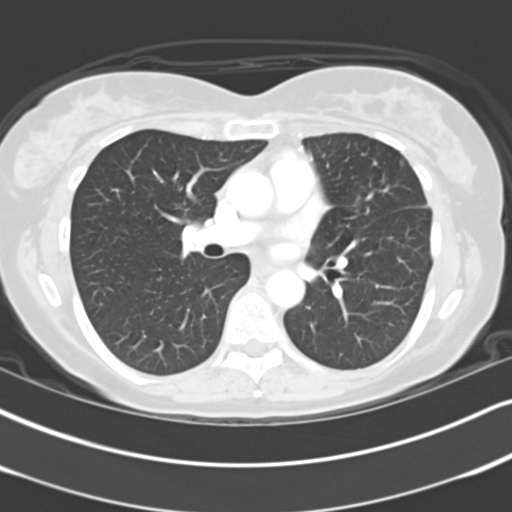
[im 40/62  lung]
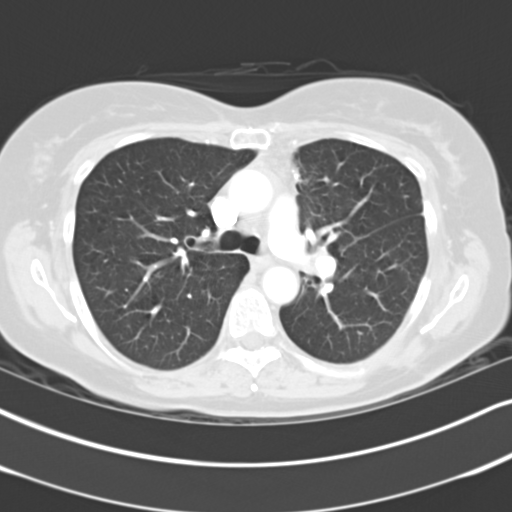
[im 44/62  lung]
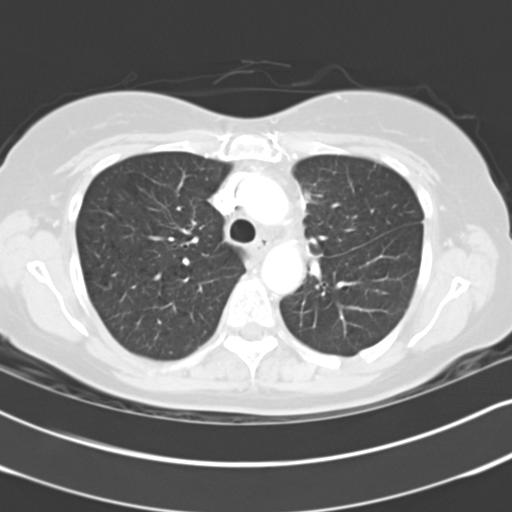
[im 48/62  lung]
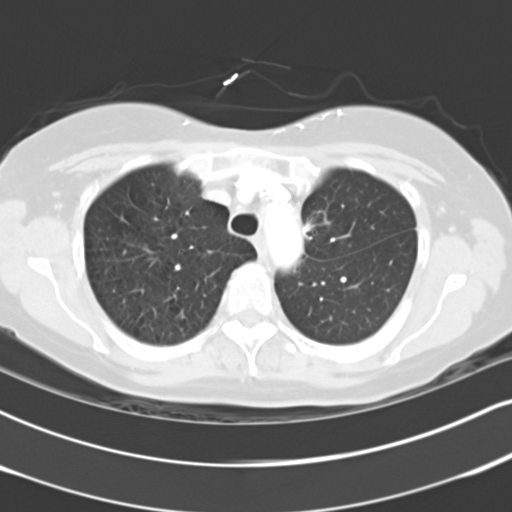
[im 53/62  mediastinal]
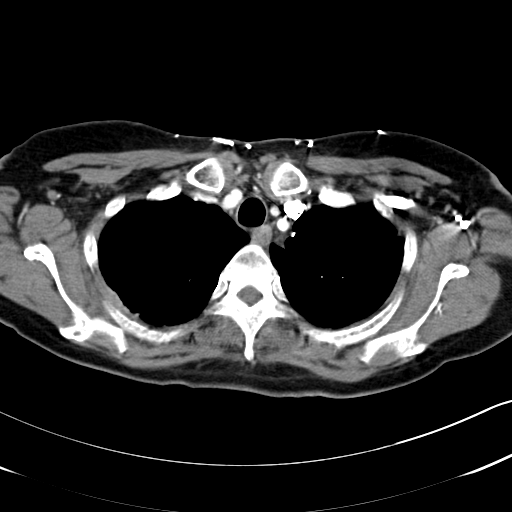
[im 53/62  lung]
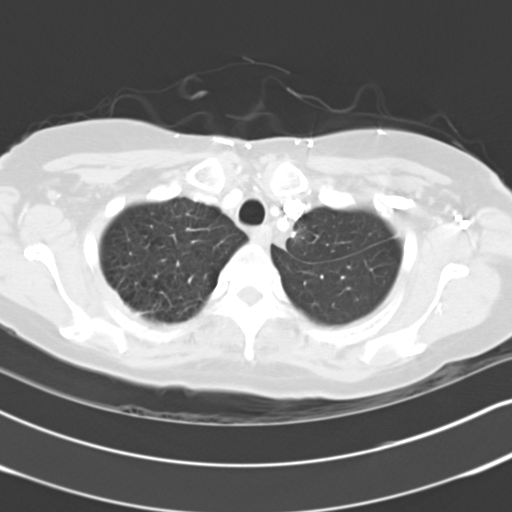
[im 57/62  lung]
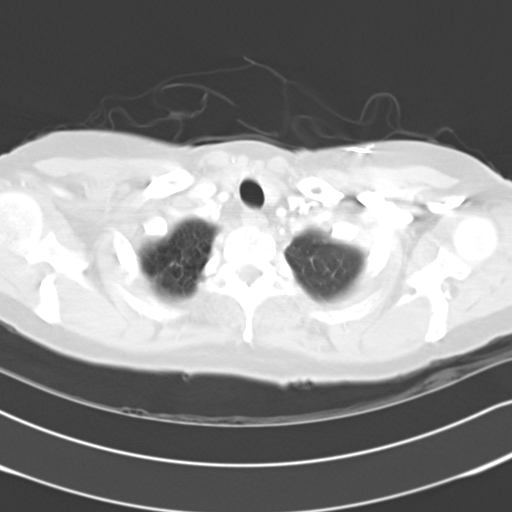

[14 of 30 positions shown; findings below may reference images not displayed]

FINDINGS: Previously seen left upper lobe nodule is no longer visualized,
compatible patient's history of prior resection. There is posterior somewhat
nodular density noted in the right apex which most likely represents scarring.
This was noted on the prior head CT and appears stable. More inferiorly in the
right middle lobe, there is a 7-8 mm nodule seen on image 39 on today's study.
This is better defined than on previous study and may be slightly larger.

No other pulmonary nodules are noted. No mediastinal, hilar, or axillary
adenopathy. Heart size. No effusions. COPD changes present.

IMPRESSION

Interval resection of the left upper lobe nodule.

7-8 mm right middle lobe nodule which may be slightly larger than on prior
study. This is also better defined today. This lesion is too small for biopsy or
for evaluation by PET-CT. Therefore, recommend followup CT in 3-6 months.

Somewhat nodular density posteriorly in the right apex, stable since prior
PET-CT, most likely scarring. 

COPD

## 2004-12-25 ENCOUNTER — Ambulatory Visit: Payer: Self-pay | Admitting: Internal Medicine

## 2004-12-26 ENCOUNTER — Ambulatory Visit (HOSPITAL_COMMUNITY): Admission: RE | Admit: 2004-12-26 | Discharge: 2004-12-26 | Payer: Self-pay | Admitting: Internal Medicine

## 2004-12-26 IMAGING — CT CT CHEST W/ CM
1 of 2 series · 14 of 30 positions shown, 18 images · IV contrast (omnipaque)
Comparison: [DATE] CT.

CLINICAL DATA: Lung cancer; left upper lobe resection; finished chemotherapy.    
 CHEST CT WITH CONTRAST:
TECHNIQUE: Multidetector CT imaging of the chest was performed following the standard protocol during bolus administration of intravenous contrast.
 Contrast:  80 cc Omnipaque 300.

[Series 2: chest_routine 5.0 b40f st · axial · 0.60mm/px · z∈[-328,-68]mm · 14 of 62 slices shown, 18 images]
[im 5/62  mediastinal]
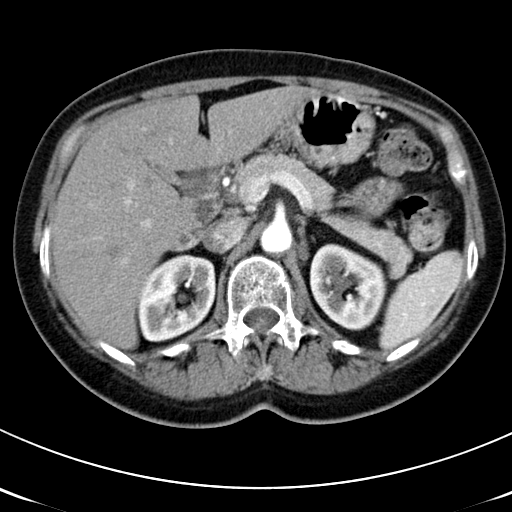
[im 5/62  lung]
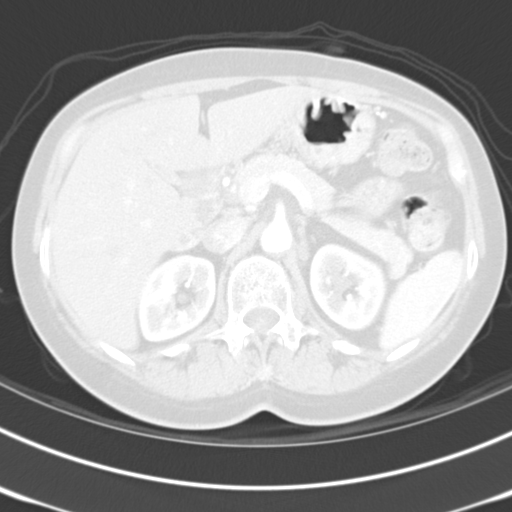
[im 9/62  lung]
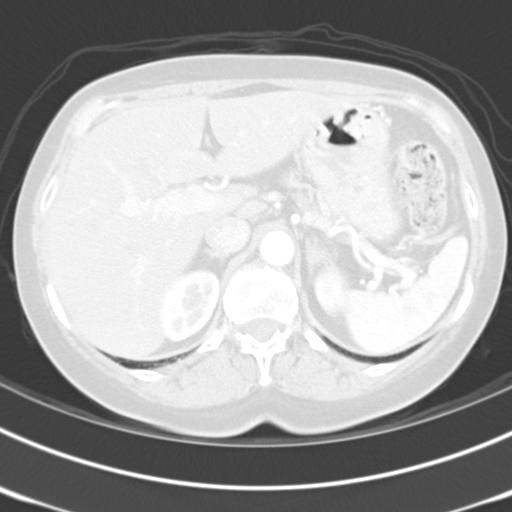
[im 14/62  lung]
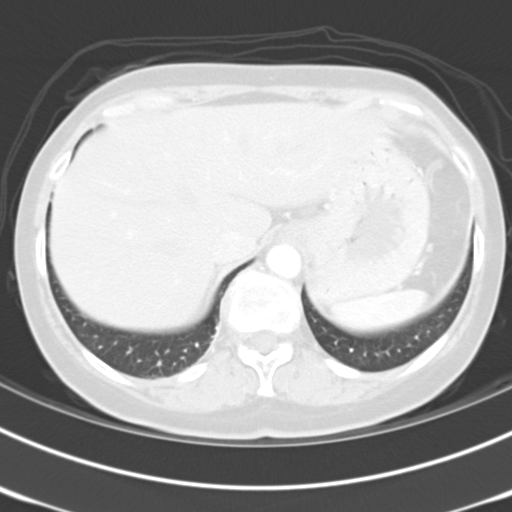
[im 18/62  lung]
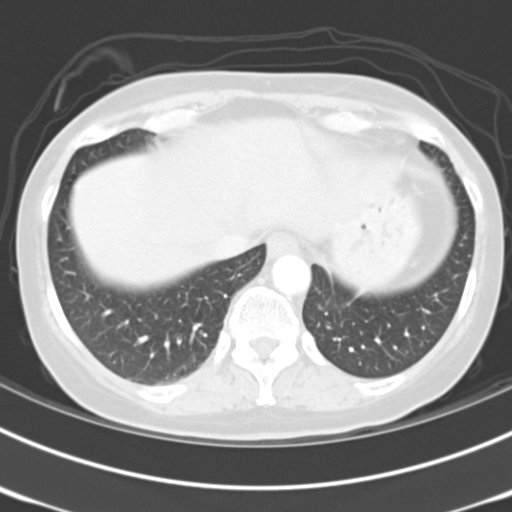
[im 22/62  mediastinal]
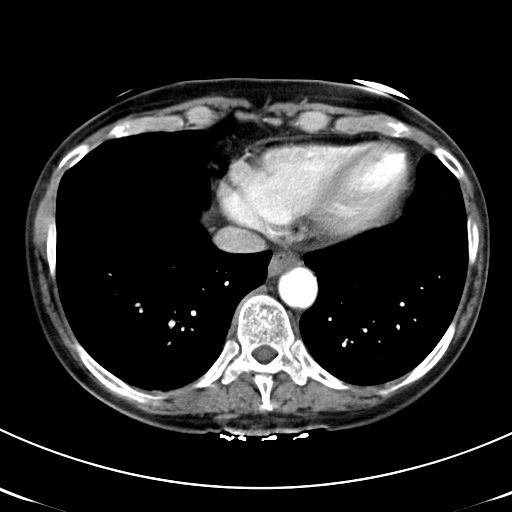
[im 22/62  lung]
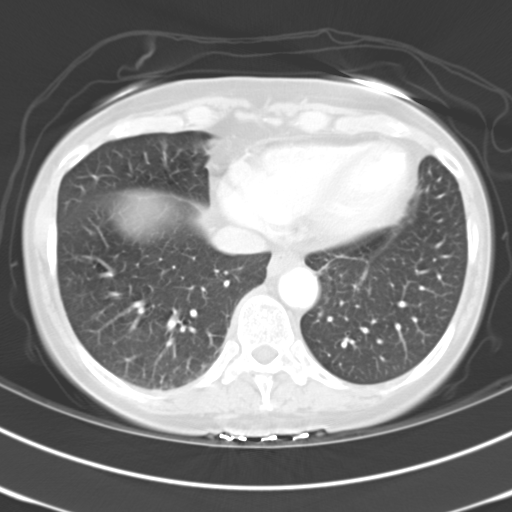
[im 27/62  lung]
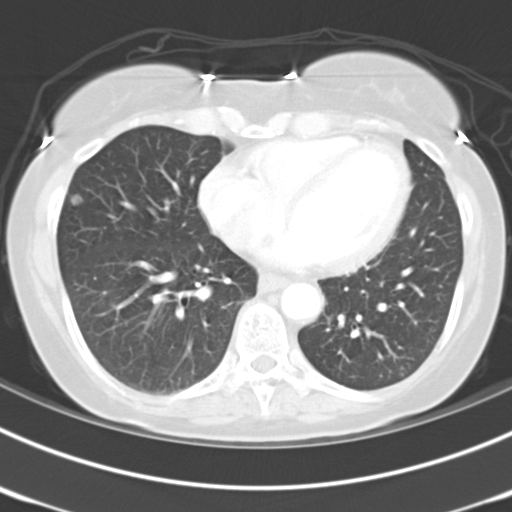
[im 30/62  lung]
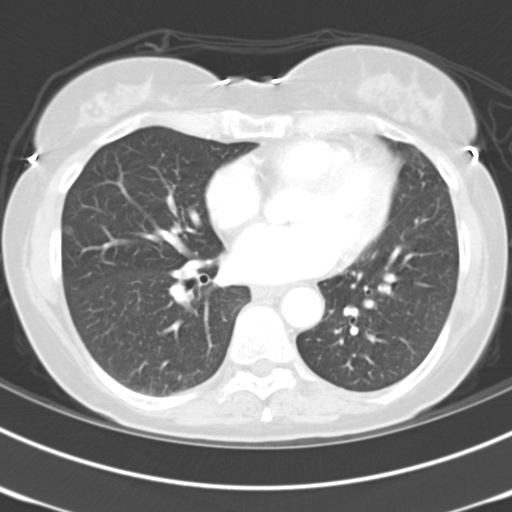
[im 31/62  lung]
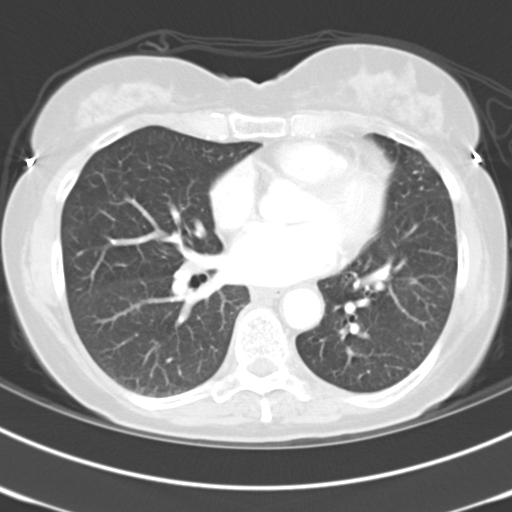
[im 35/62  mediastinal]
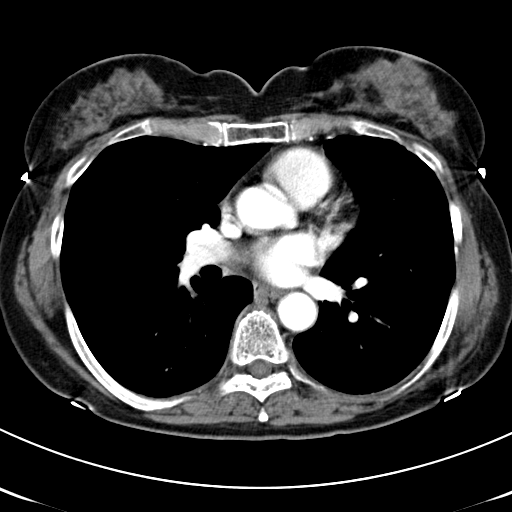
[im 35/62  lung]
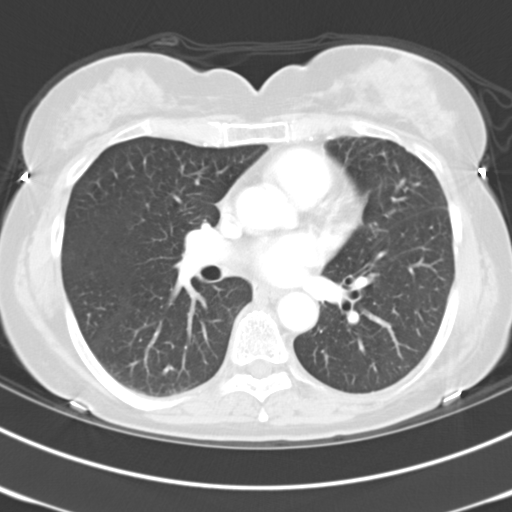
[im 40/62  lung]
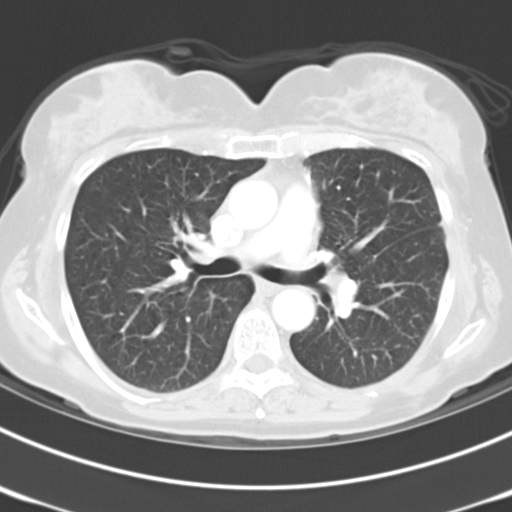
[im 44/62  lung]
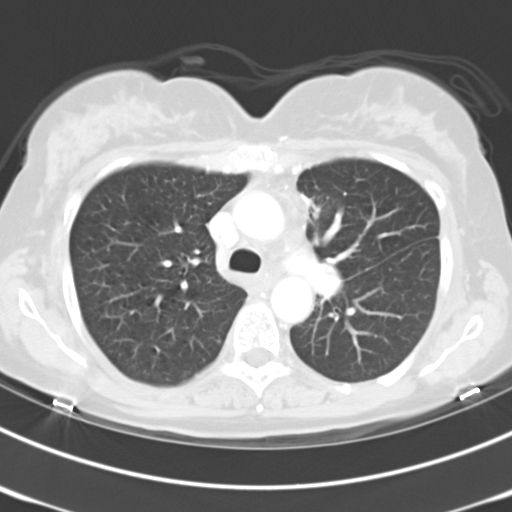
[im 48/62  lung]
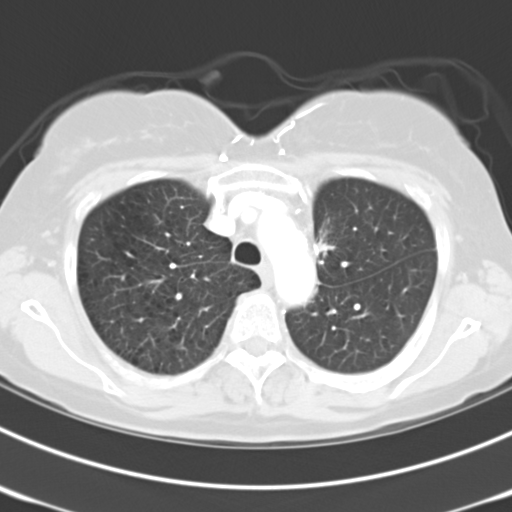
[im 53/62  mediastinal]
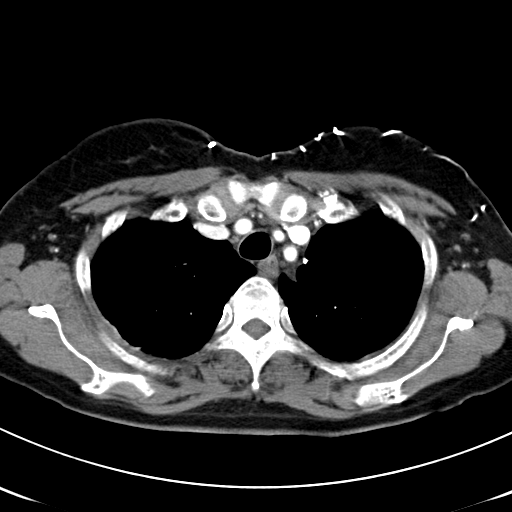
[im 53/62  lung]
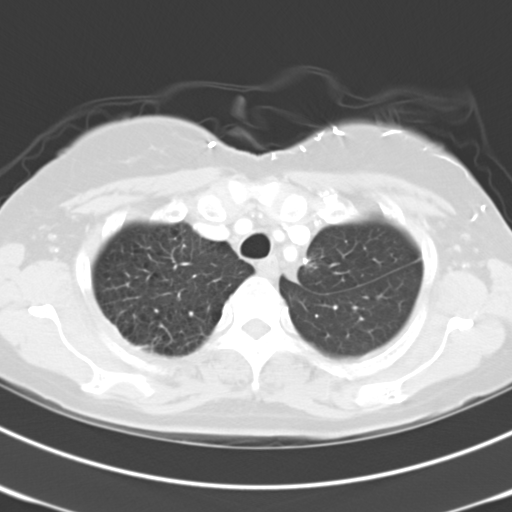
[im 57/62  lung]
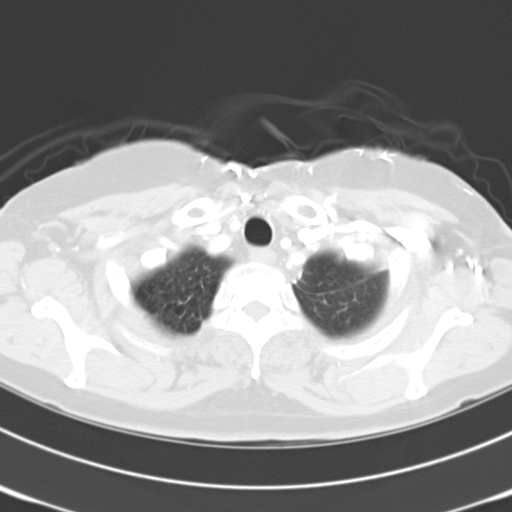

[14 of 30 positions shown; findings below may reference images not displayed]

FINDINGS: Lung windows demonstrate post surgical changes, likely of left upper lobectomy.  Severe centrilobular emphysema.  A subpleural 3-4 mm focus of increased density likely represents a ground-glass nodule.  This is slightly more well defined on today?s exam but this could be differences in slice selection.  A right middle lobe lung nodule on image 36 currently measures 7 mm.  This measured 7 to 8 mm previously.  It is ground-glass attenuation.  A posterior right upper lobe nodule measures 3 x 5 mm today and measured similar on the prior.  There is an adjacent area of pleural-based scarring.  Spiculated density at the site of surgical change in the medial aspect of the left lung apex on image #15 is similar to on the prior exam.  A subpleural lingular nodule on image #26 is less distinct than on the prior.  
 Soft tissue windows demonstrate mild cardiomegaly.  No pericardial or pleural effusion.  No mediastinal or hilar adenopathy.  Limited imaging of the upper abdomen including the adrenal glands is within normal limits.  Collaterals about the left anterior chest suggest a component of central venous insufficiency.  No thrombus identified.  
 Bone windows demonstrate no focal osseous lesion.
IMPRESSION: 1.  Overall stable appearance of the chest.  Numerous lung nodules, including the largest right middle lobe lung nodule are not significantly changed.  The dominant right middle lobe lung nodule appears slightly less well defined today but measures similar.  In contrast, other nodules appear somewhat more well defined today.  These changes are felt to most likely be due to differences in slice selection but ongoing surveillance is recommended.

## 2005-01-10 ENCOUNTER — Encounter: Admission: RE | Admit: 2005-01-10 | Discharge: 2005-01-10 | Payer: Self-pay | Admitting: Thoracic Surgery

## 2005-01-10 IMAGING — CR DG CHEST 2V
2 series · 2 of 2 positions shown · non-contrast
Comparison: CT of the chest [DATE] and [DATE] and plain films [DATE].

CLINICAL DATA: Lung lesion. 
 TWO VIEW CHEST:

[view not recorded (1 of 2)]
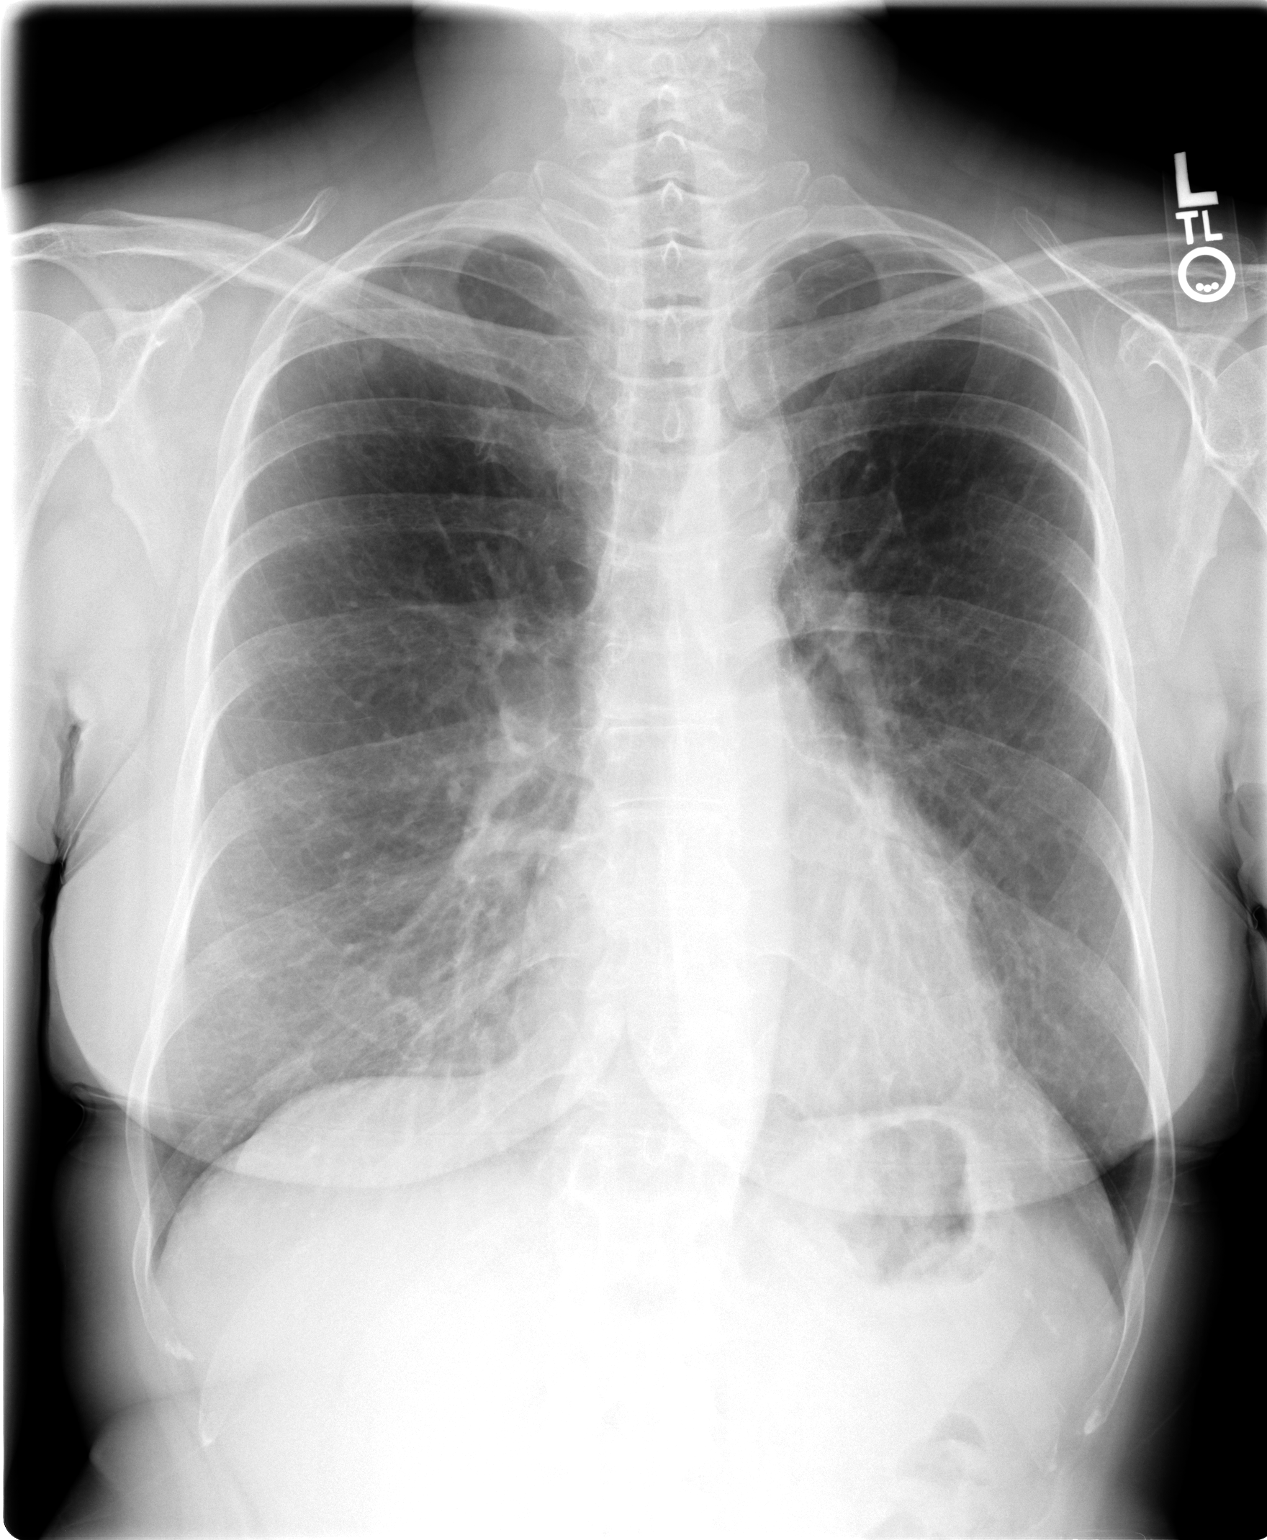

[view not recorded (2 of 2)]
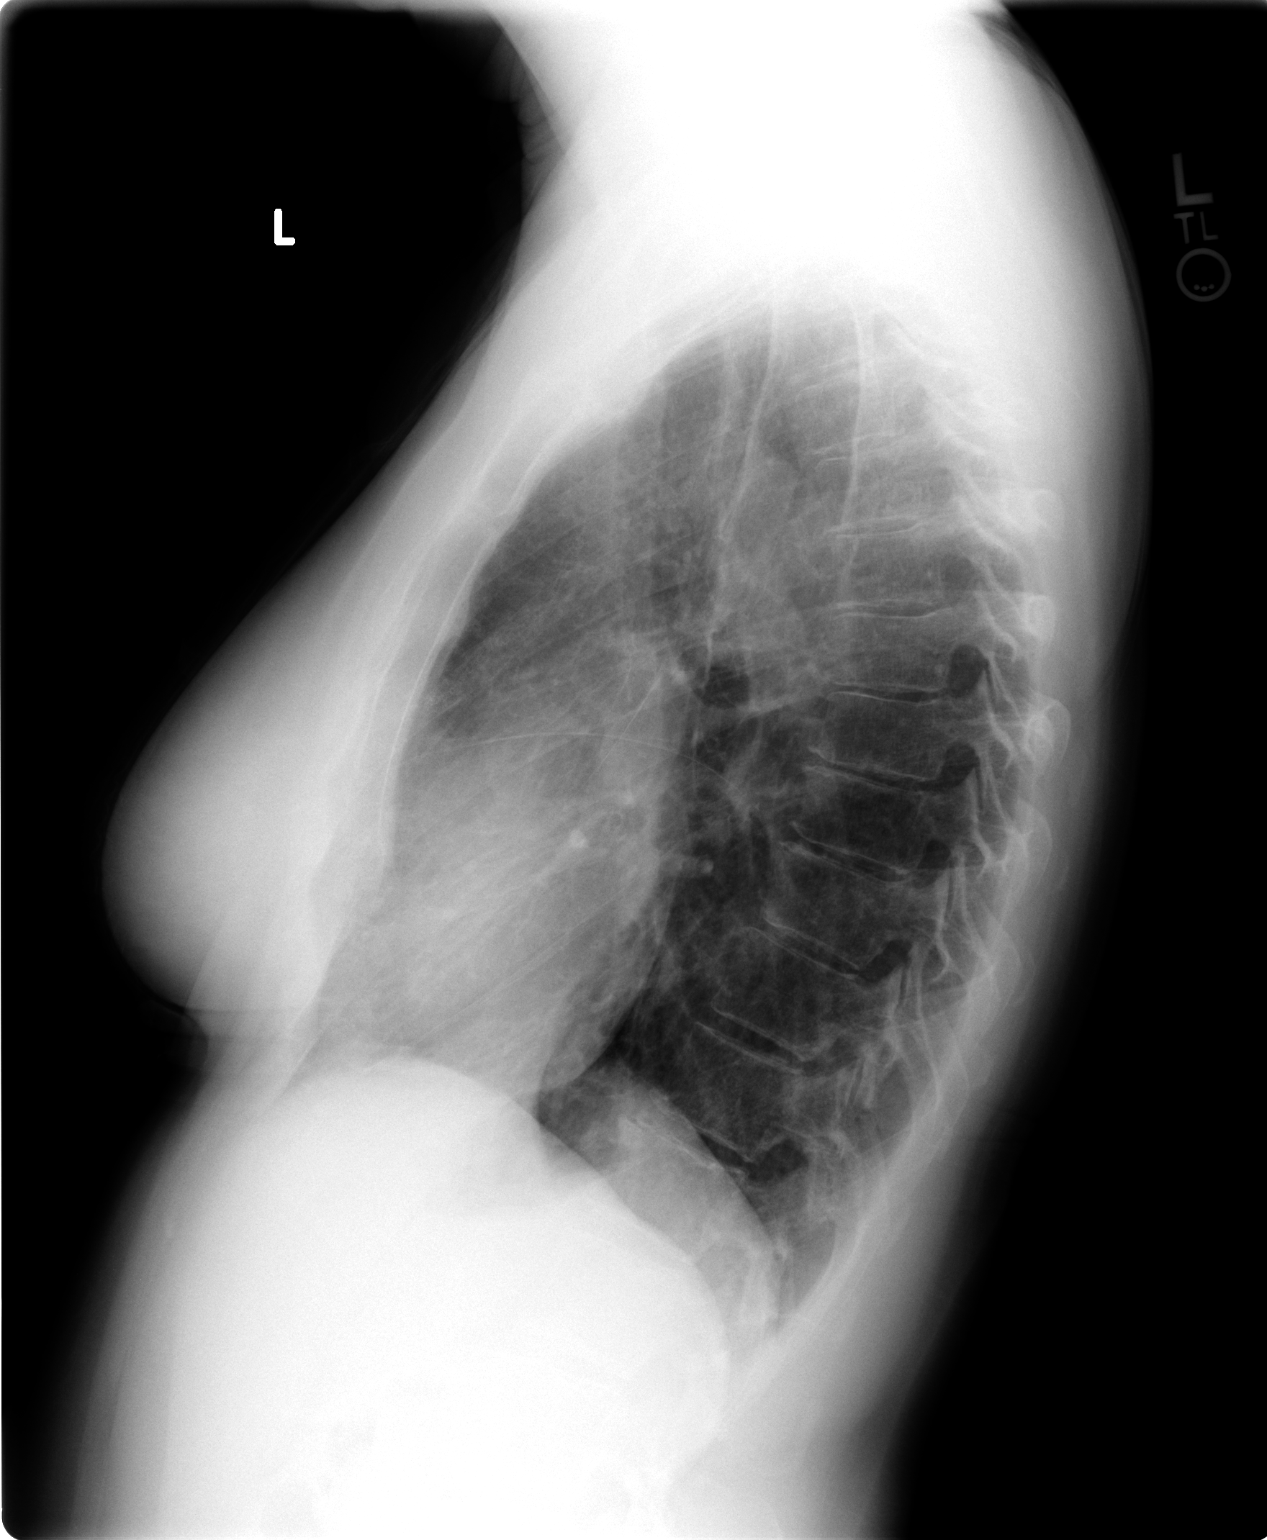

[2 of 2 positions shown; findings below may reference images not displayed]

Midline trachea.  Heart size is normal.  Mediastinal contours are unremarkable.  Minimal reticular opacity in the lateral aspect of the right apex is similar to on the prior.  The lungs are otherwise clear.  The small nodules described on the recent CT are not readily apparent by plain film.  There is post surgical change at the posterior left 6th rib.  No pleural effusion.
IMPRESSION: Stable appearance of the chest with minimal residual reticular opacity in the lateral right lung apex.

## 2005-03-23 ENCOUNTER — Ambulatory Visit: Payer: Self-pay | Admitting: Internal Medicine

## 2005-03-27 ENCOUNTER — Ambulatory Visit (HOSPITAL_COMMUNITY): Admission: RE | Admit: 2005-03-27 | Discharge: 2005-03-27 | Payer: Self-pay | Admitting: Internal Medicine

## 2005-03-27 IMAGING — CT CT CHEST W/ CM
2 of 3 series · 15 of 36 positions shown, 18 images · IV contrast (omnipaque)
Comparison: [DATE] and [DATE].

<!--  IDXRADR:ADDEND:BEGIN -->Addendum Begins
<!--  IDXRADR:ADDEND:INNER_BEGIN -->Addendum:

A 3 mm nodule in the posterior aspect of the right lobe of the thyroid gland is
unchanged. A 6 mm right lobe thyroid nodule, more superiorly, was not previously
included.
<!--  IDXRADR:ADDEND:INNER_END -->Addendum Ends
<!--  IDXRADR:ADDEND:END -->Clinical Data: Followup lung cancer. Chemotherapy finished in [DATE].
CHEST CT WITH CONTRAST
TECHNIQUE: Multidetector CT imaging of the chest was performed following the
standard protocol during bolus administration of intravenous contrast.
Contrast:  80 cc Omnipaque 300

[Series 2: chest_routine 5.0 b40f st · axial · 0.62mm/px · z∈[-296,-26]mm · 12 of 64 slices shown, 15 images]
[im 5/64  mediastinal]
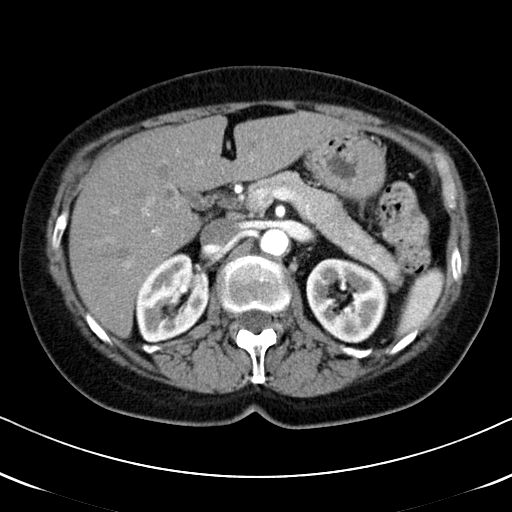
[im 5/64  lung]
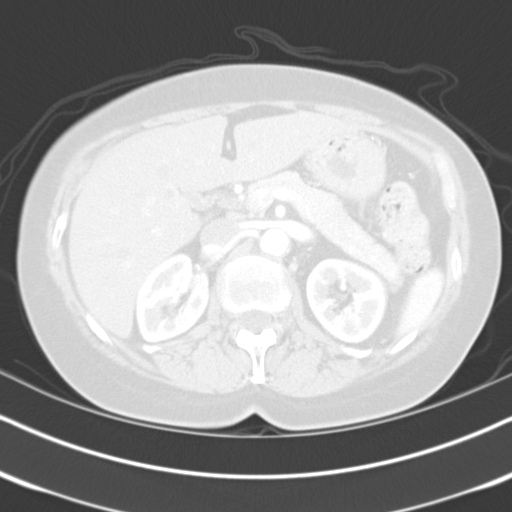
[im 10/64  lung]
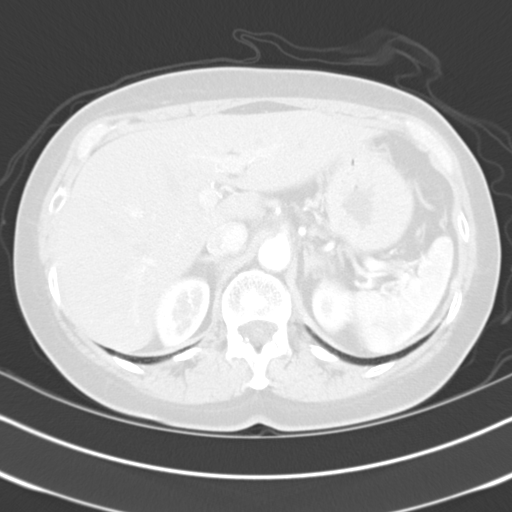
[im 15/64  lung]
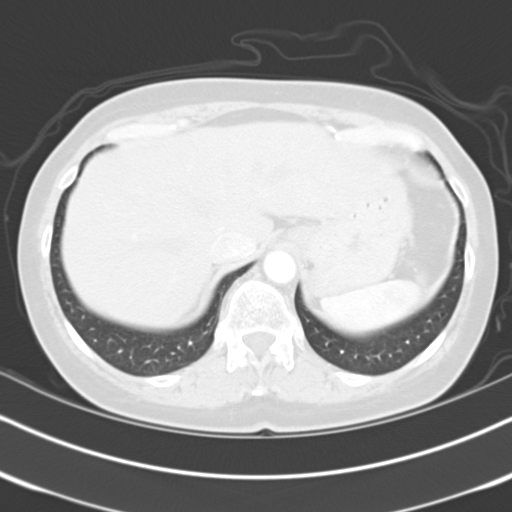
[im 19/64  lung]
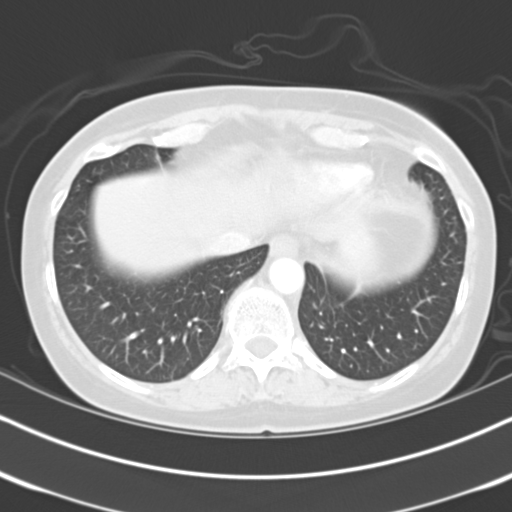
[im 24/64  mediastinal]
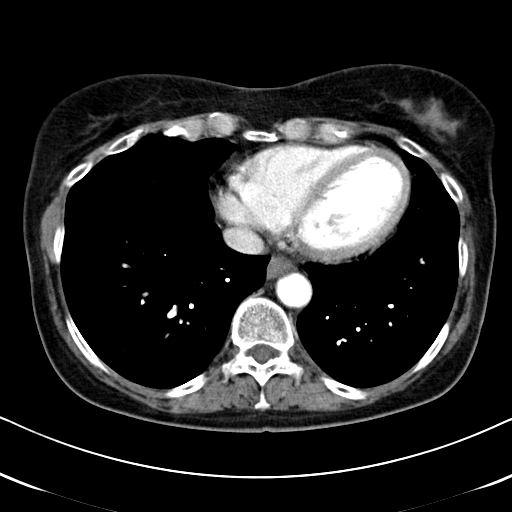
[im 24/64  lung]
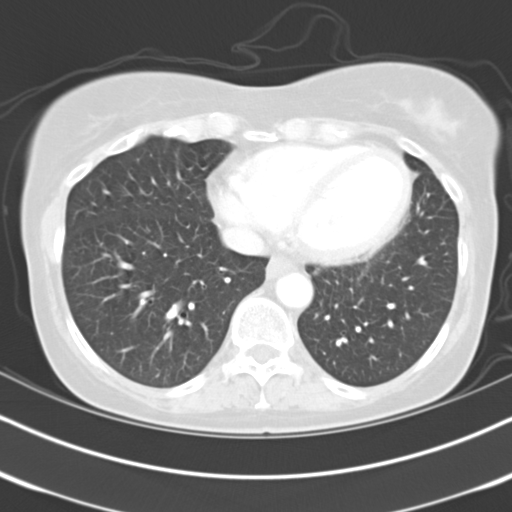
[im 29/64  lung]
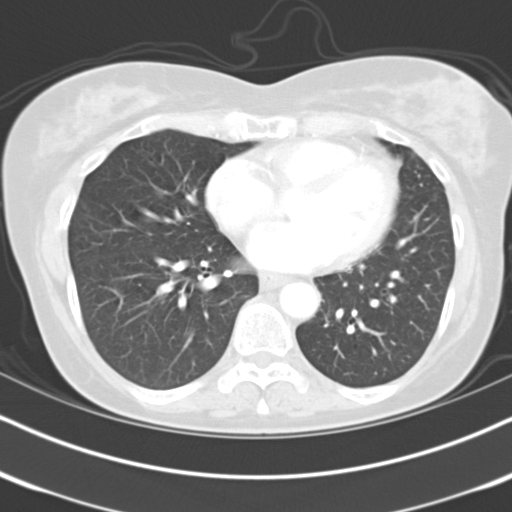
[im 36/64  lung]
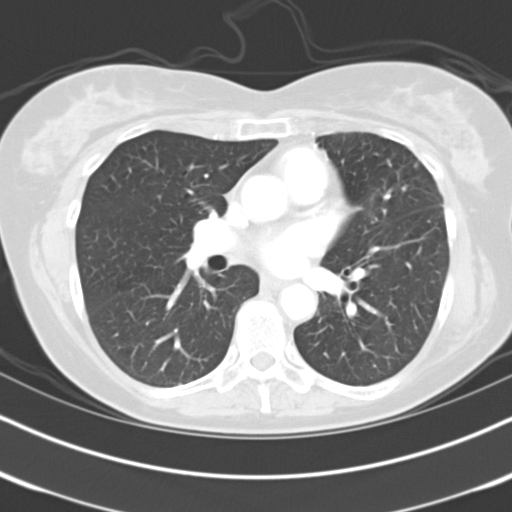
[im 40/64  lung]
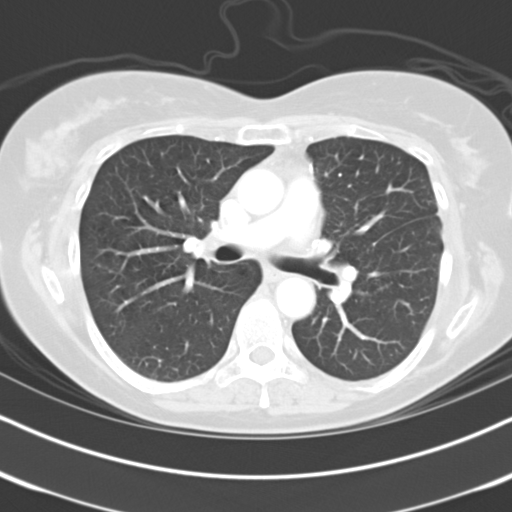
[im 45/64  mediastinal]
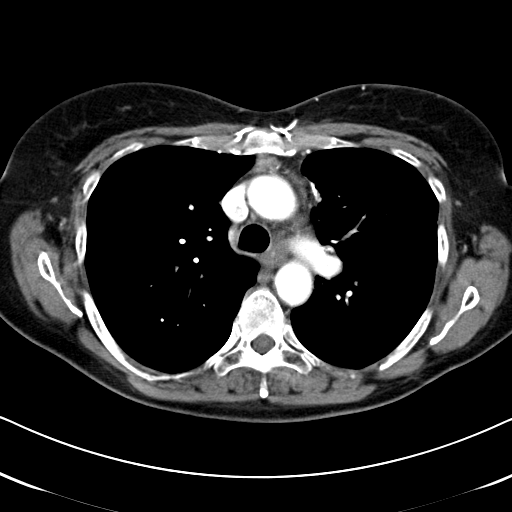
[im 45/64  lung]
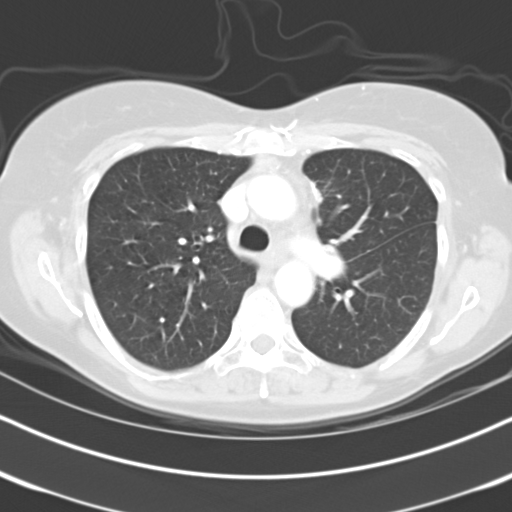
[im 50/64  lung]
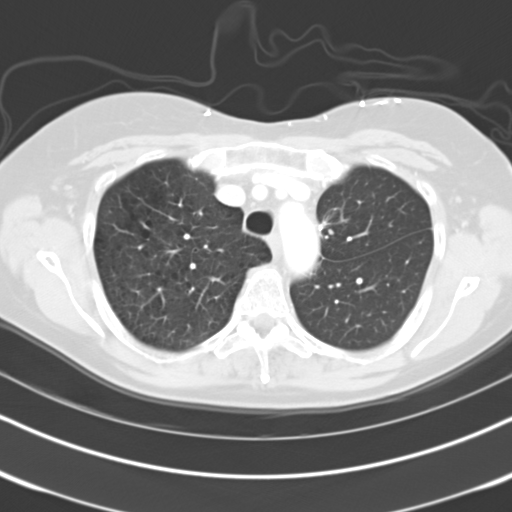
[im 54/64  lung]
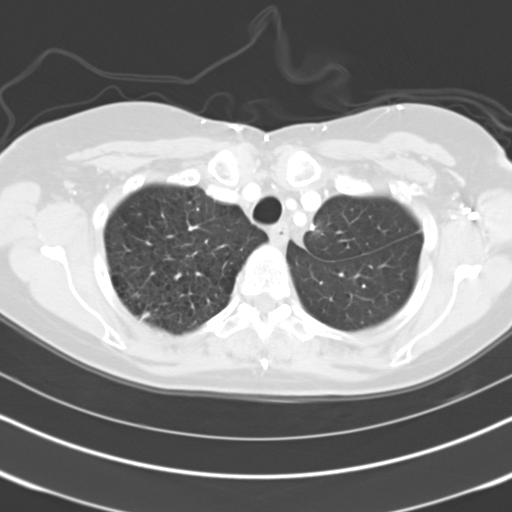
[im 59/64  lung]
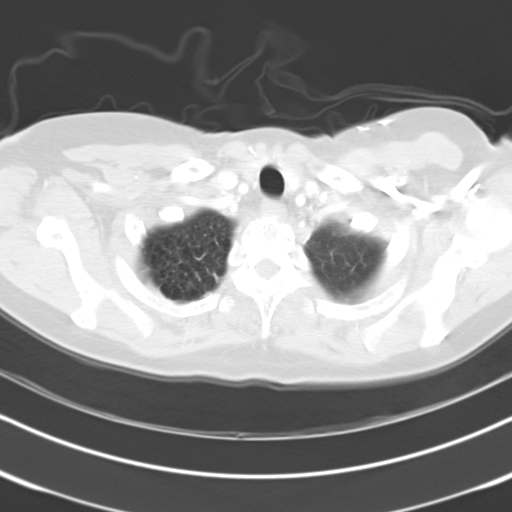

[Series 602: coronal · coronal · 0.64mm/px · 3 of 39 slices shown]
[im 8/39  lung]
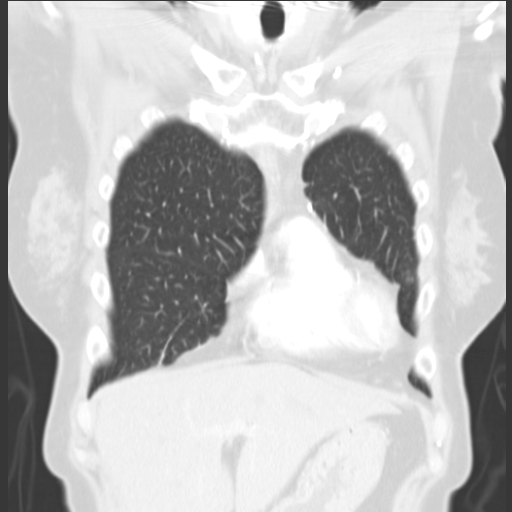
[im 16/39  lung]
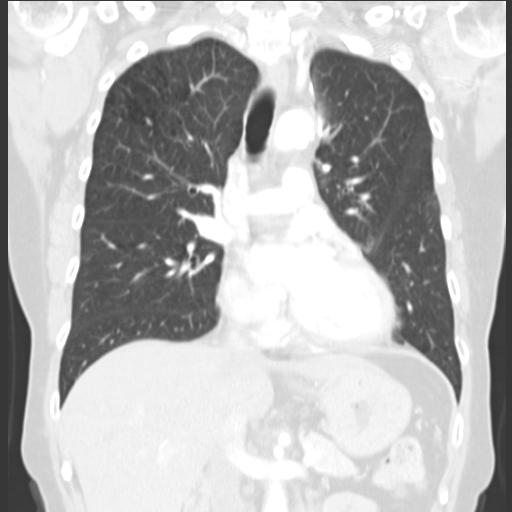
[im 23/39  lung]
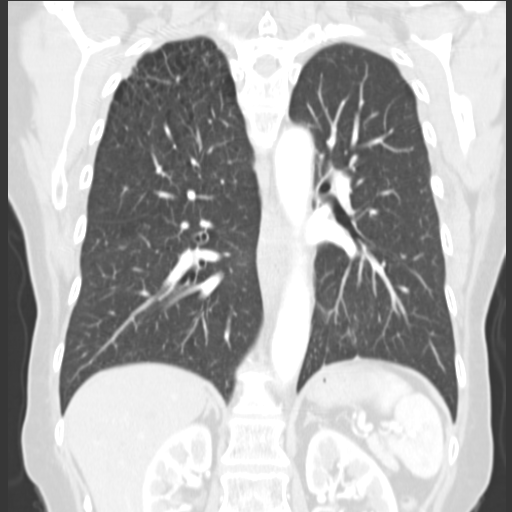

[15 of 36 positions shown; findings below may reference images not displayed]

FINDINGS: No significant change in a small nodular density in the posterior
aspect of the right upper lobe. This currently measures 5 x 4 mm in maximum
dimensions and measured 5 x 3 mm in maximum corresponding dimensions on
[DATE]. No significant change in pleural and parenchymal scarring in the
posterolateral aspect of the right upper lobe. Stable small area of scarring in
the medial aspect of the left upper lobe or remaining lingula. Stable 6 mm area
of minimally increased density in the inferior aspect of the right upper lobe.
Stable 8 mm nodule in the right middle lobe. Stable post thoracotomy rib defect
on the left. Stable changes of COPD. No new nodules and no enlarged lymph nodes.
Unremarkable bones and upper abdomen.

IMPRESSION

1. Stable small areas of nodularity and scarring in both lungs, as described
above.
2. Stable changes of COPD.

## 2005-06-25 ENCOUNTER — Ambulatory Visit: Payer: Self-pay | Admitting: Internal Medicine

## 2005-06-26 ENCOUNTER — Ambulatory Visit (HOSPITAL_COMMUNITY): Admission: RE | Admit: 2005-06-26 | Discharge: 2005-06-26 | Payer: Self-pay | Admitting: Internal Medicine

## 2005-06-26 LAB — CBC WITH DIFFERENTIAL/PLATELET
Basophils Absolute: 0 10*3/uL (ref 0.0–0.1)
Eosinophils Absolute: 0.2 10*3/uL (ref 0.0–0.5)
HGB: 13.1 g/dL (ref 11.6–15.9)
LYMPH%: 41.4 % (ref 14.0–48.0)
MCV: 91.7 fL (ref 81.0–101.0)
MONO%: 10.3 % (ref 0.0–13.0)
NEUT#: 2.1 10*3/uL (ref 1.5–6.5)
NEUT%: 42.9 % (ref 39.6–76.8)
Platelets: 328 10*3/uL (ref 145–400)

## 2005-06-26 LAB — COMPREHENSIVE METABOLIC PANEL
Alkaline Phosphatase: 75 U/L (ref 39–117)
BUN: 19 mg/dL (ref 6–23)
Glucose, Bld: 89 mg/dL (ref 70–99)
Sodium: 141 mEq/L (ref 135–145)
Total Bilirubin: 0.4 mg/dL (ref 0.3–1.2)
Total Protein: 6.7 g/dL (ref 6.0–8.3)

## 2005-06-26 IMAGING — CT CT CHEST W/ CM
2 of 4 series · 15 of 36 positions shown, 18 images · IV contrast (omnipaque)
Comparison: [DATE]

CLINICAL DATA: Lung cancer

CHEST CT WITH CONTRAST
TECHNIQUE: Multidetector CT imaging of the chest was performed following the
standard protocol during bolus administration of intravenous contrast.
Contrast:  80 cc Omnipaque 300

[Series 2: chest_routine 5.0 b40f st · axial · 0.65mm/px · z∈[+944,+1214]mm · 12 of 64 slices shown, 15 images]
[im 5/64  mediastinal]
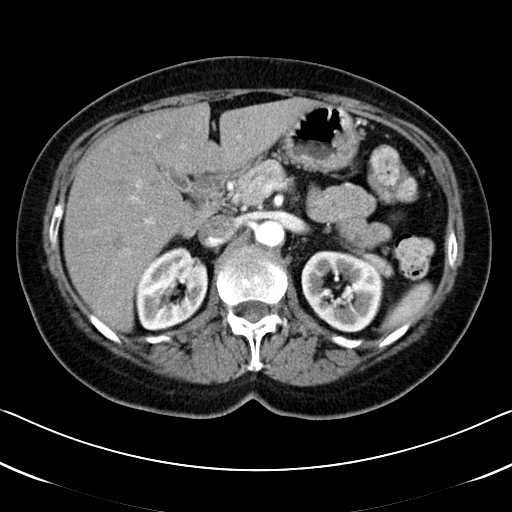
[im 5/64  lung]
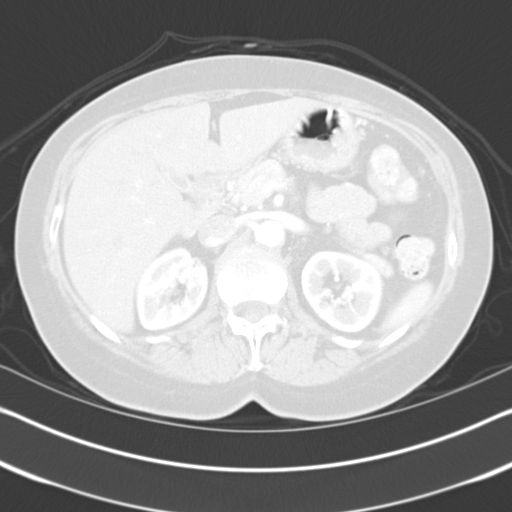
[im 10/64  lung]
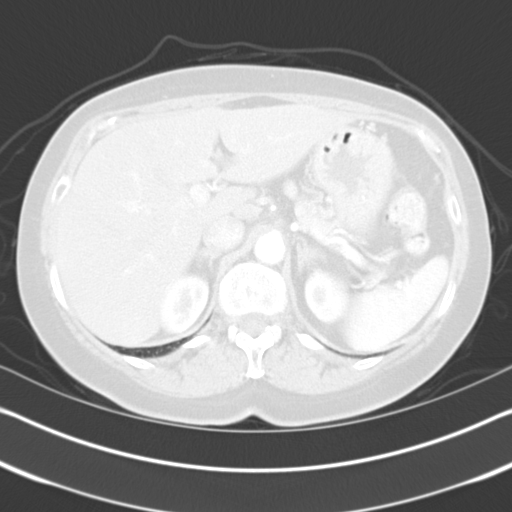
[im 14/64  lung]
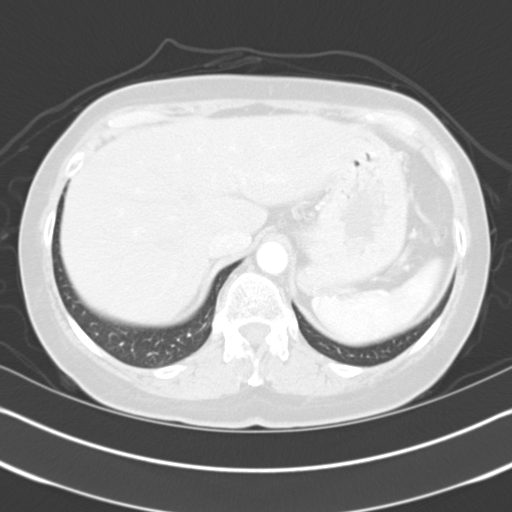
[im 19/64  lung]
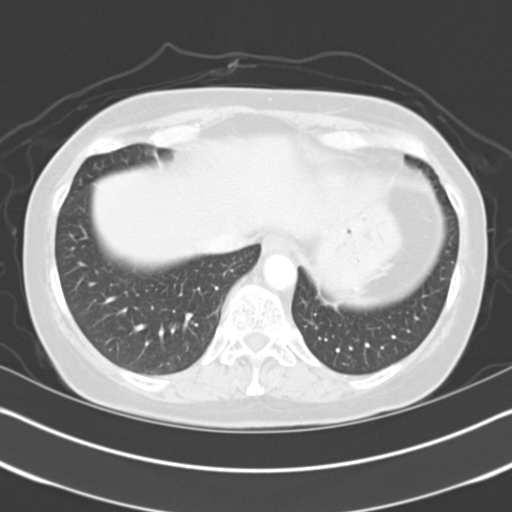
[im 23/64  mediastinal]
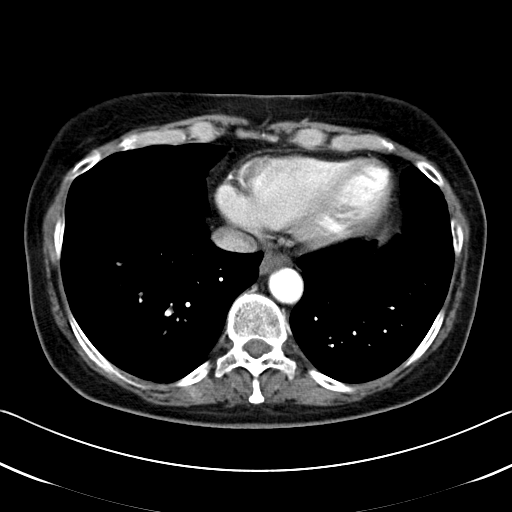
[im 23/64  lung]
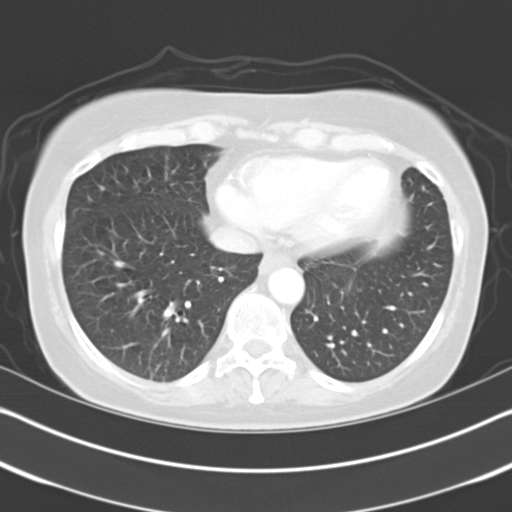
[im 28/64  lung]
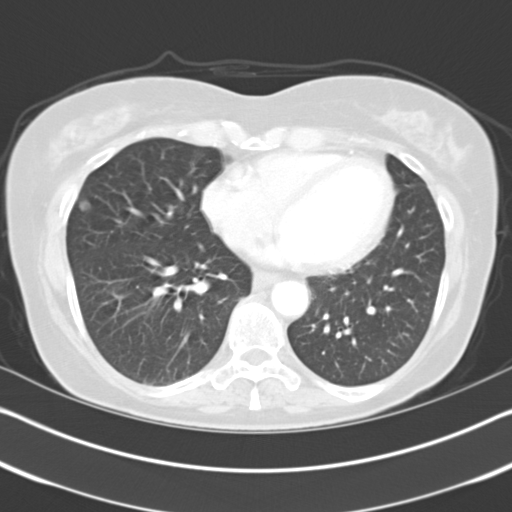
[im 37/64  lung]
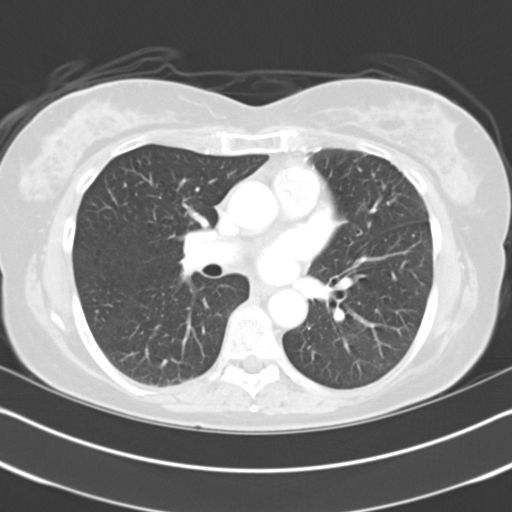
[im 41/64  lung]
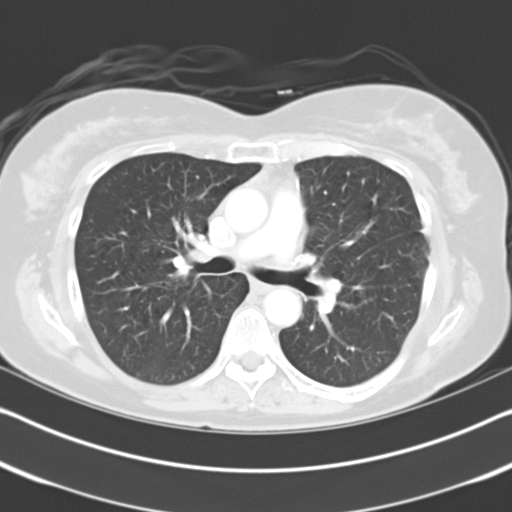
[im 46/64  mediastinal]
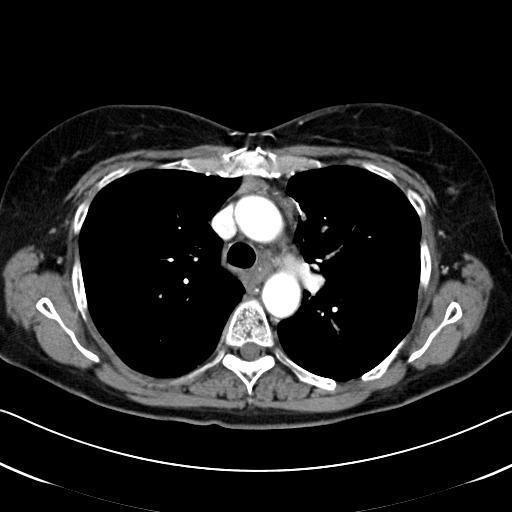
[im 46/64  lung]
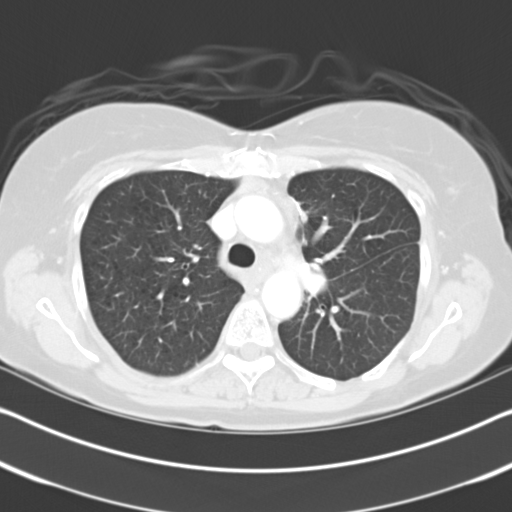
[im 50/64  lung]
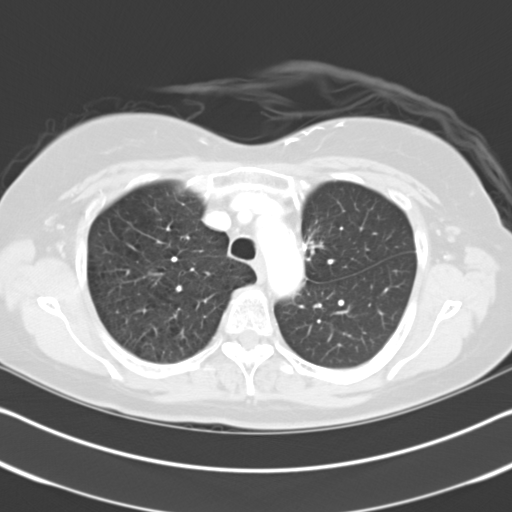
[im 55/64  lung]
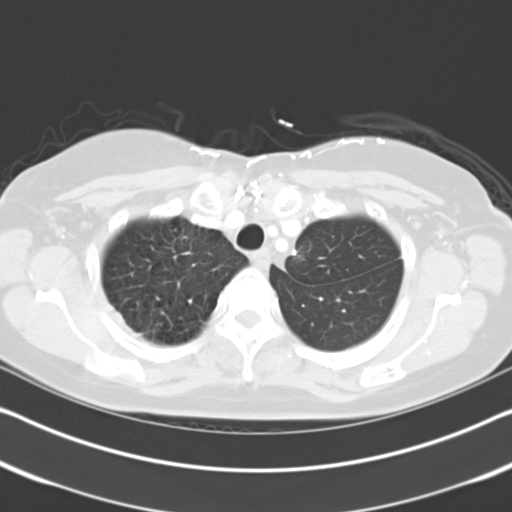
[im 59/64  lung]
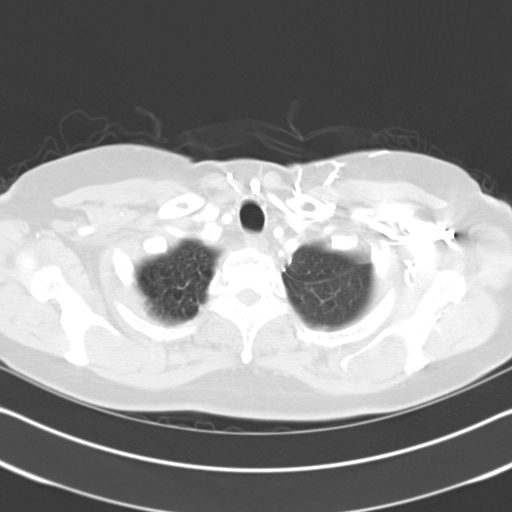

[Series 602: coronal images · coronal · 0.65mm/px · 3 of 39 slices shown]
[im 8/39  lung]
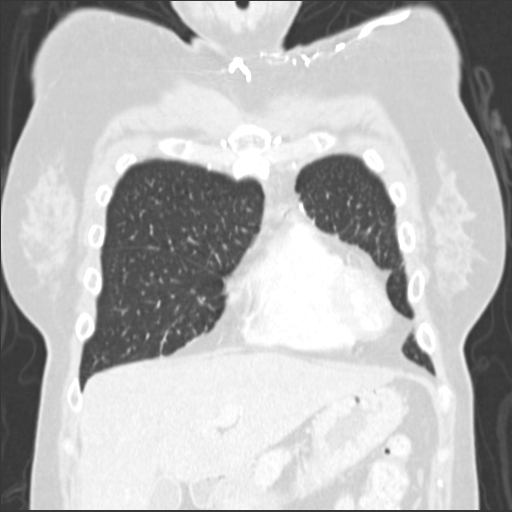
[im 16/39  lung]
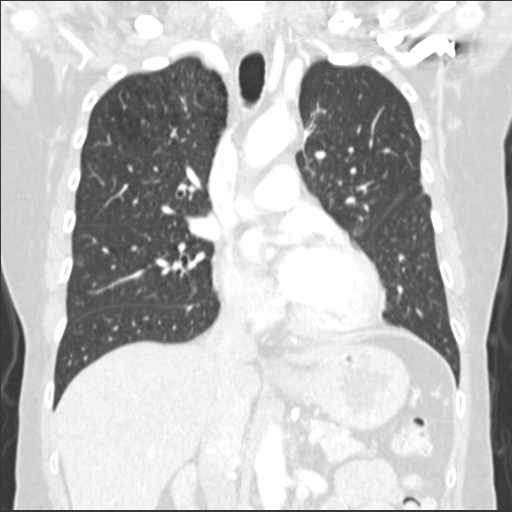
[im 23/39  lung]
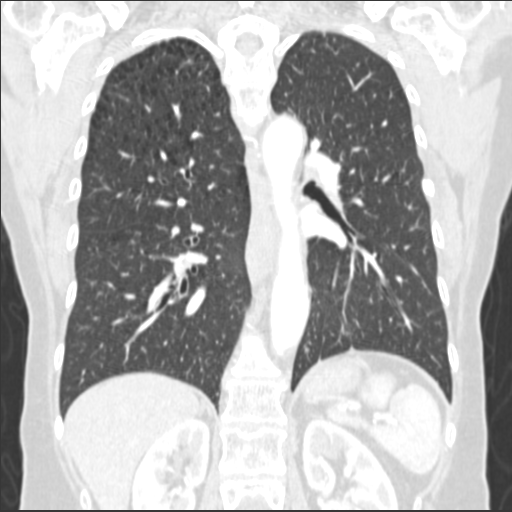

[15 of 36 positions shown; findings below may reference images not displayed]

FINDINGS: Postoperative changes again noted in the left upper lobe. Nodule
within the posterior right upper lobe is unchanged at 5 mm on image 14. Vague
density peripherally in the inferior right upper lobe or possibly right middle
lobe is again seen, unchanged at 5 mm on image 33. On image 37, there is a 7 mm
nodule which is unchanged also since prior study. No nodules noted on the left.
No effusions. No mediastinal, hilar, or axillary adenopathy. Small low density
nodules within the right thyroid lobe are unchanged since prior study. COPD
changes present.

Imaging into the upper abdomen is unremarkable.

IMPRESSION

No significant change since prior study. No evidence of recurrent or metastatic
disease.

COPD.

## 2005-07-03 LAB — BASIC METABOLIC PANEL
BUN: 17 mg/dL (ref 6–23)
CO2: 27 mEq/L (ref 19–32)
Calcium: 9.2 mg/dL (ref 8.4–10.5)
Chloride: 104 mEq/L (ref 96–112)
Creatinine, Ser: 0.8 mg/dL (ref 0.4–1.2)
Glucose, Bld: 92 mg/dL (ref 70–99)
Potassium: 4.2 mEq/L (ref 3.5–5.3)
Sodium: 140 mEq/L (ref 135–145)

## 2005-07-06 IMAGING — CR DG CHEST 2V
2 series · 2 of 2 positions shown · non-contrast
Comparison: none

CLINICAL DATA: Pre-op for surgery for lung lesion.
 2-VIEWS OF THE CHEST:
 Two views of the chest show patchy opacity in the periphery of the right upper lung field.  There is no active infiltrate or effusion seen.  The heart is within normal limits in size.  No acute bony abnormality is seen.

[view not recorded (1 of 2)]
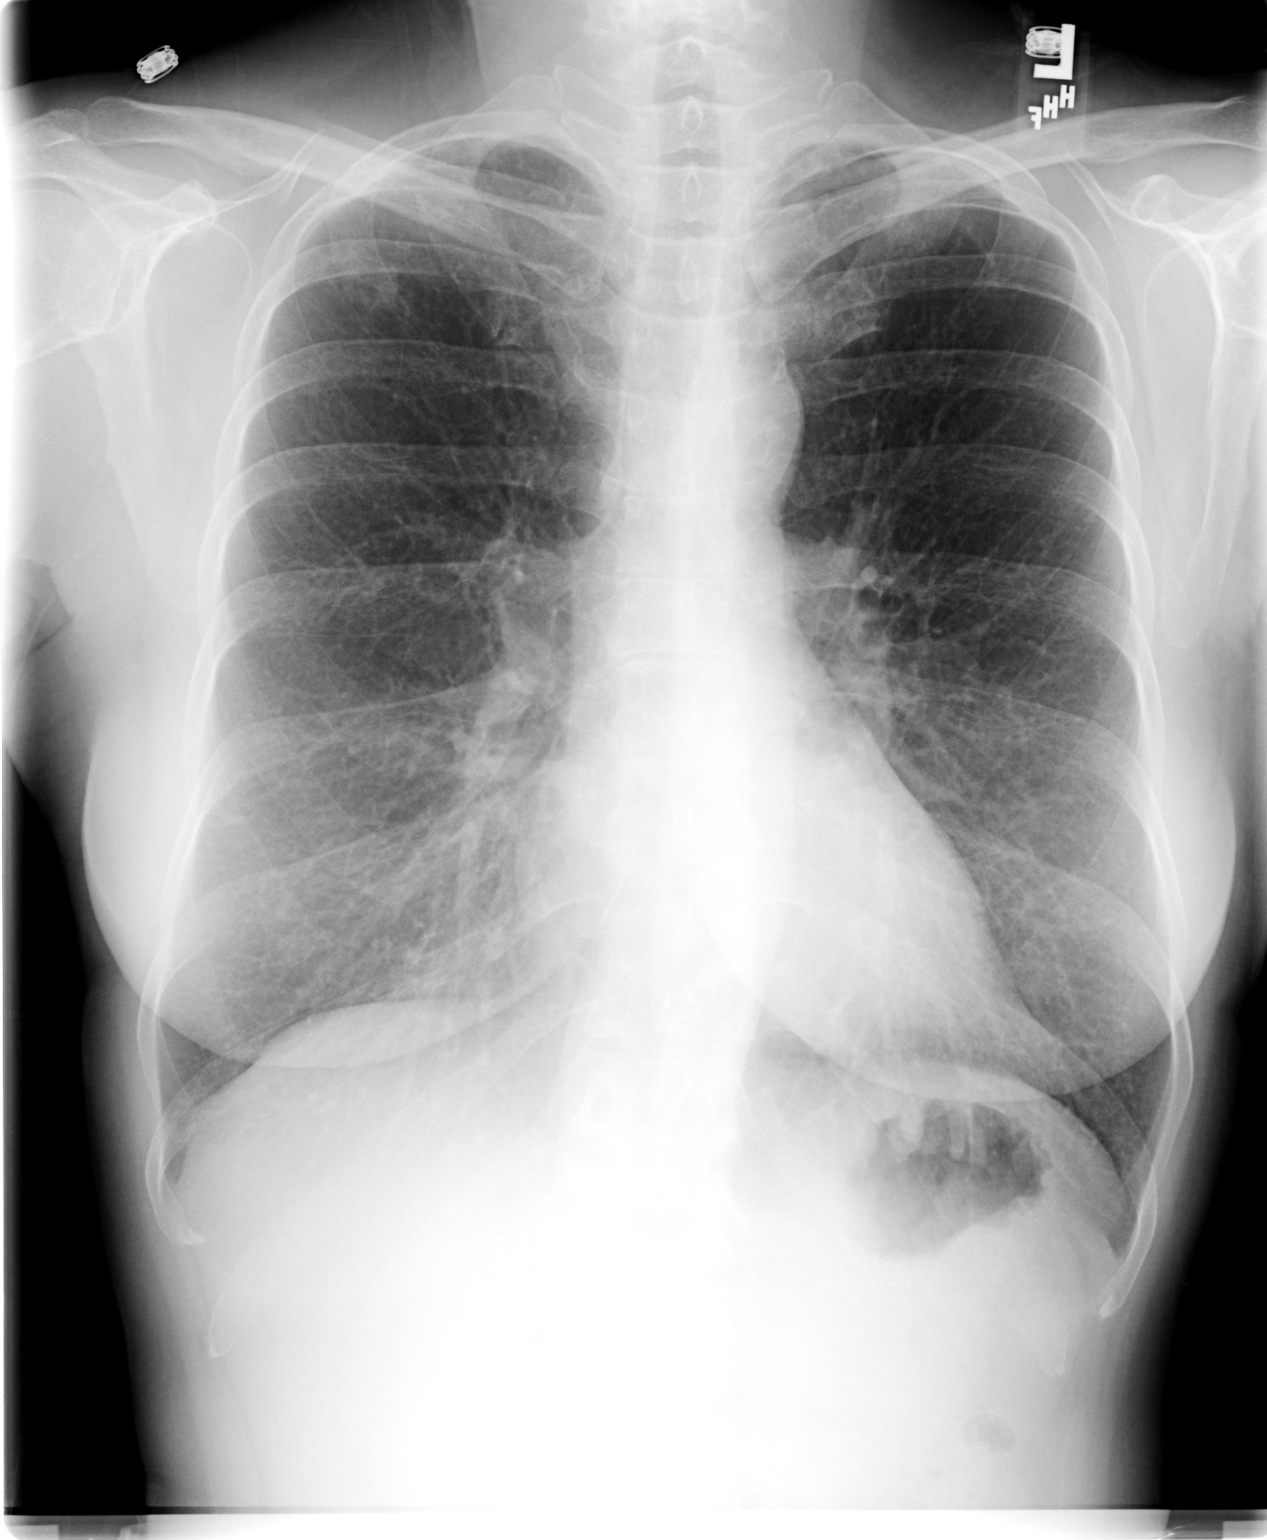

[view not recorded (2 of 2)]
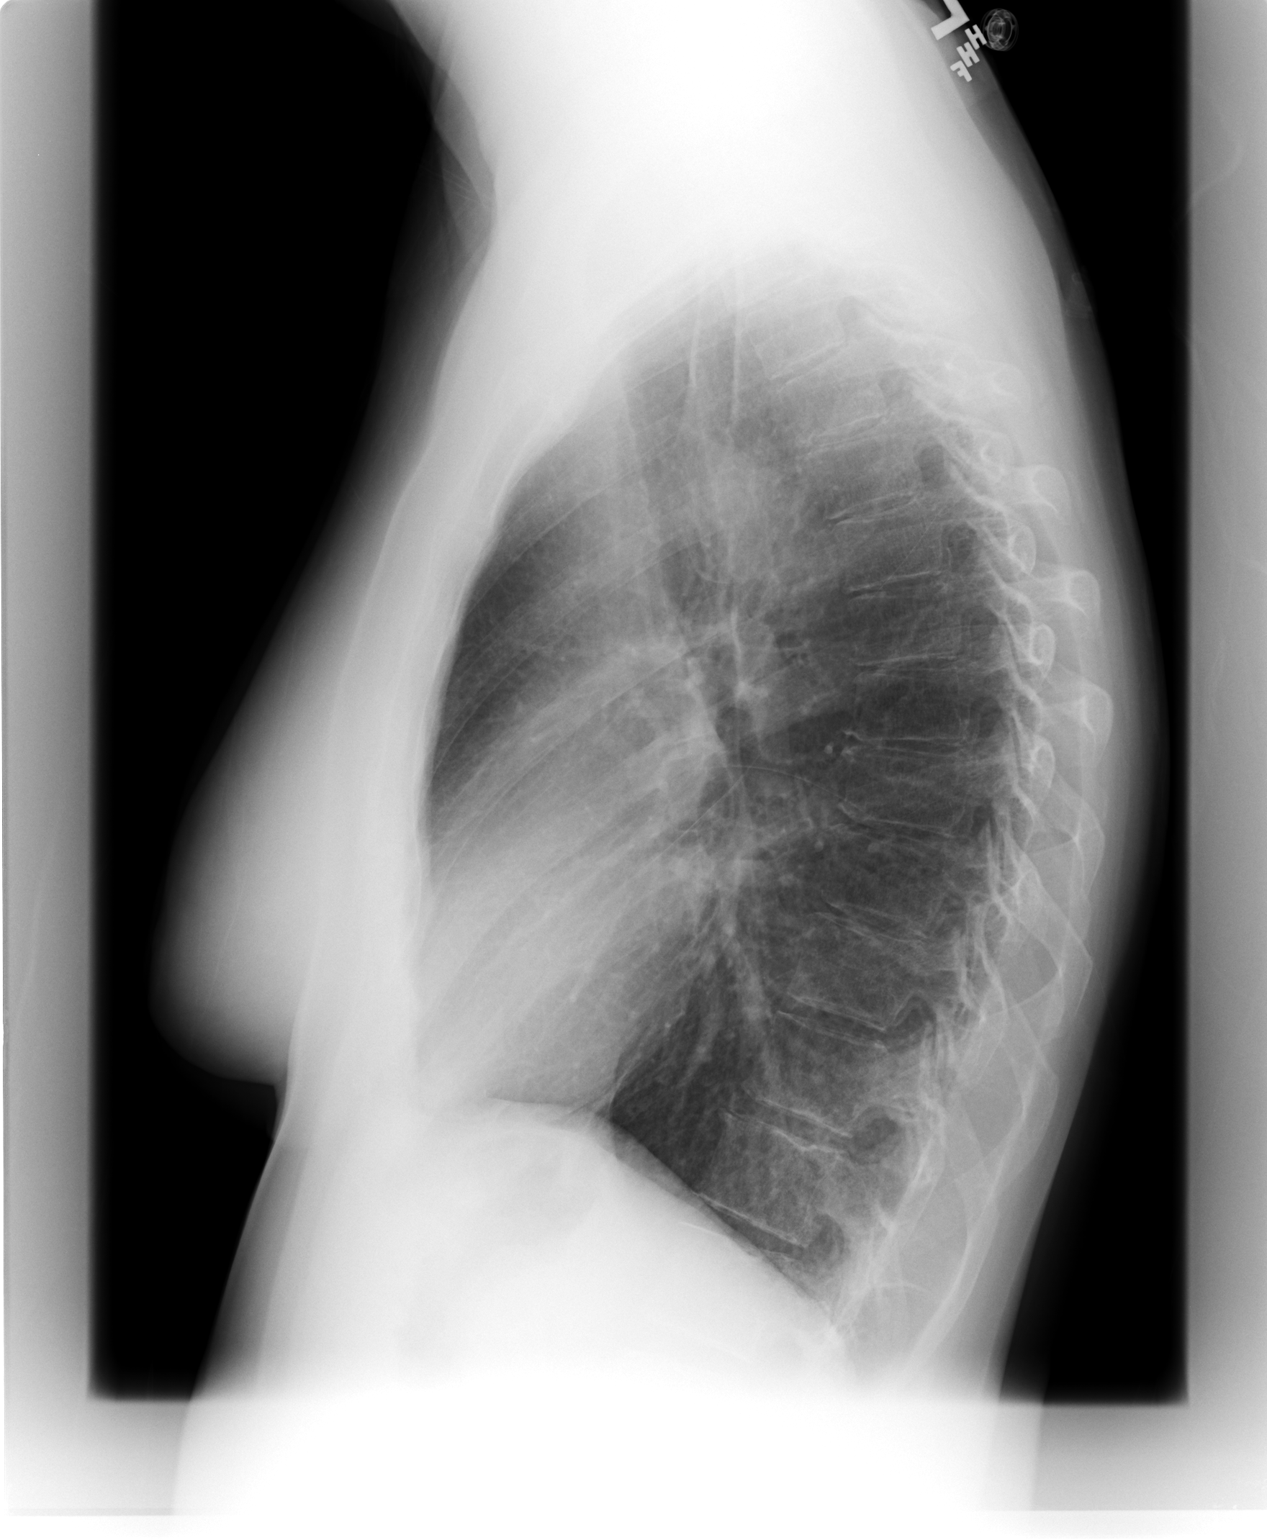

[2 of 2 positions shown; findings below may reference images not displayed]

IMPRESSION: Patchy opacity in periphery of right upper lung field.

## 2005-07-17 ENCOUNTER — Encounter: Admission: RE | Admit: 2005-07-17 | Discharge: 2005-07-17 | Payer: Self-pay | Admitting: Thoracic Surgery

## 2005-07-17 IMAGING — CR DG CHEST 2V
2 series · 2 of 2 positions shown · non-contrast
Comparison: [DATE].

CLINICAL DATA: Lung cancer ? followup. 
 CHEST ? 2 VIEW:

[view not recorded (1 of 2)]
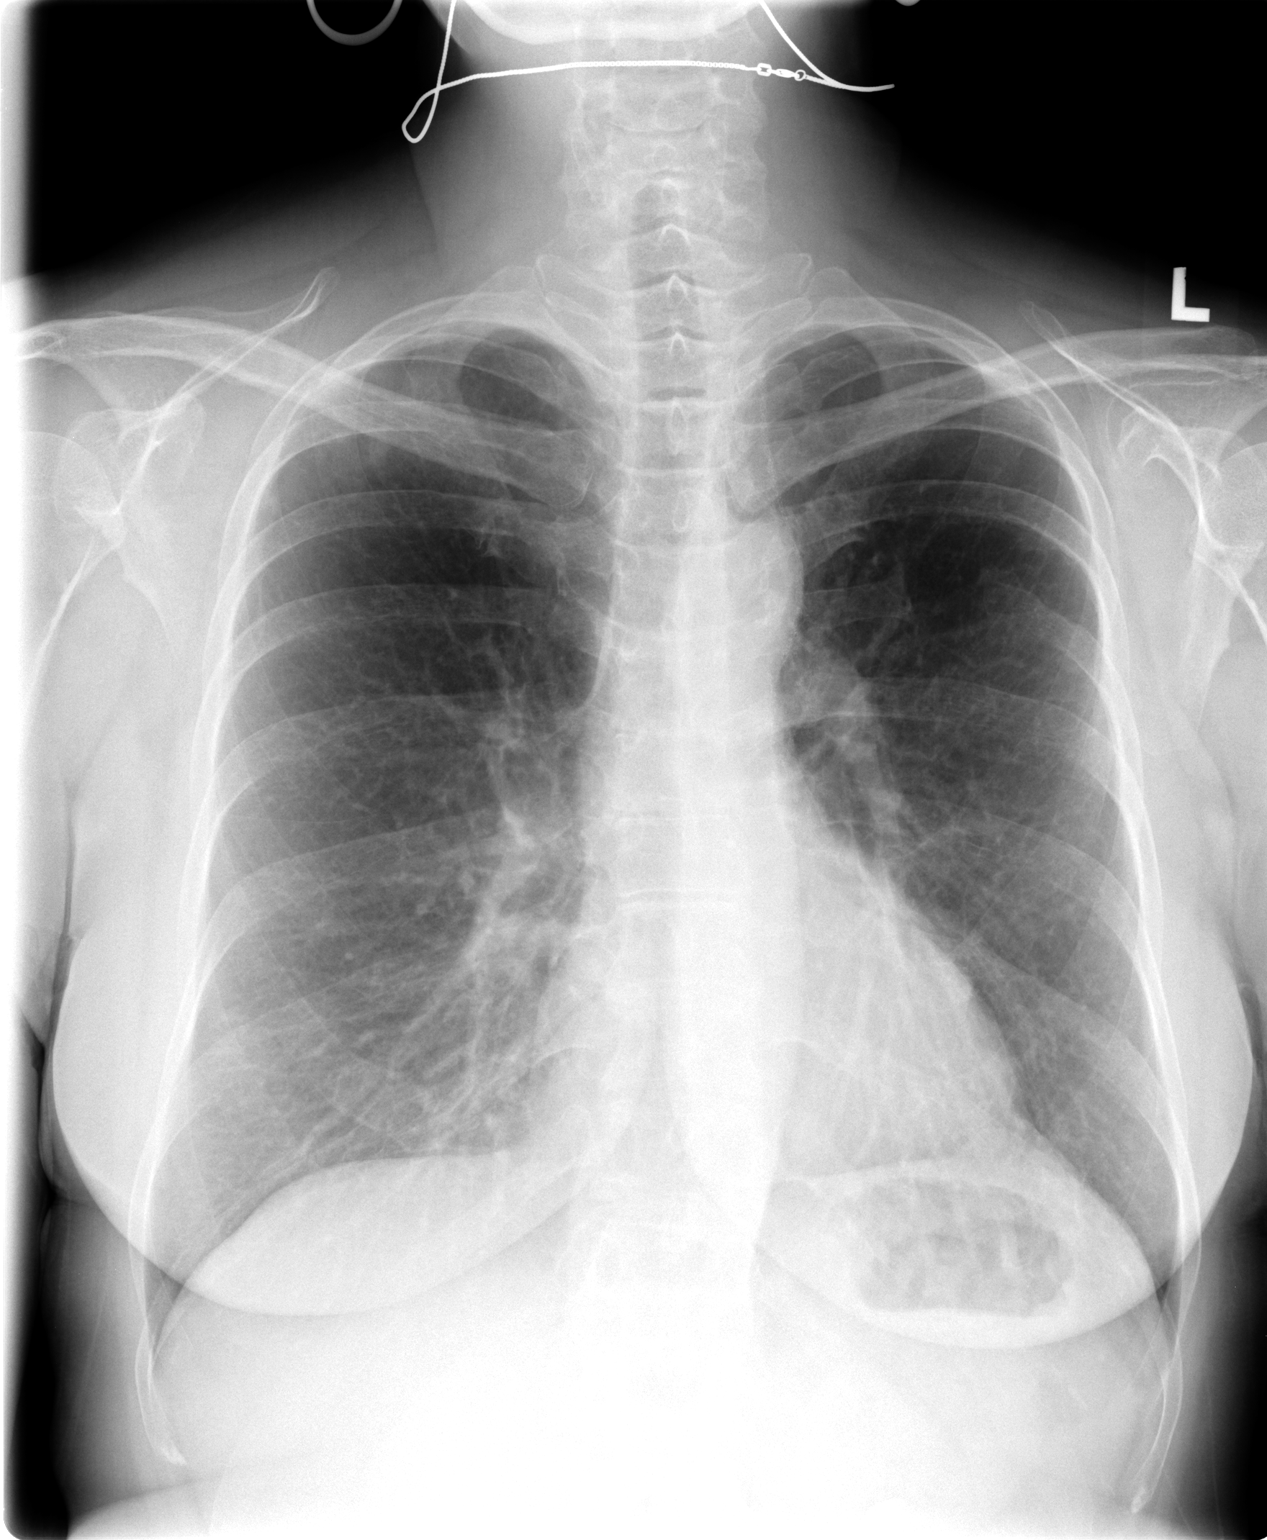

[view not recorded (2 of 2)]
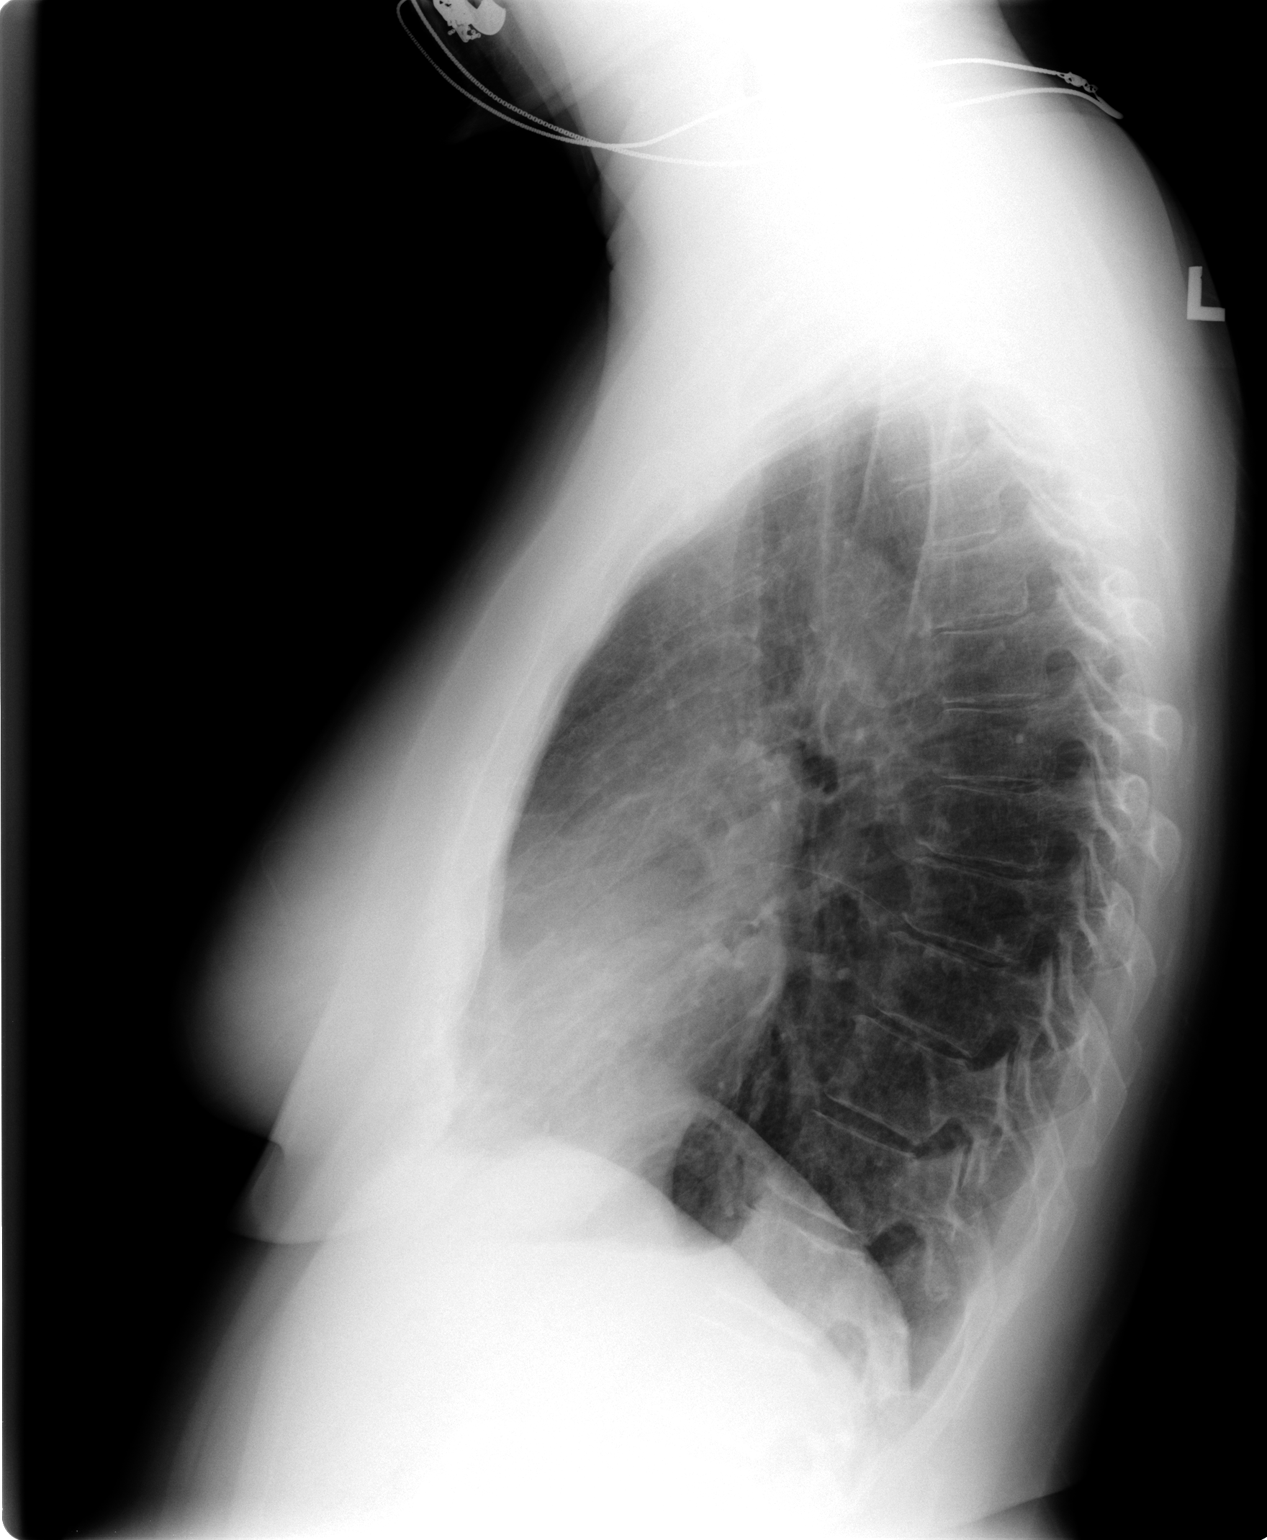

[2 of 2 positions shown; findings below may reference images not displayed]

FINDINGS: Heart size within normal limits.  Post-left thoracotomy as before.  Stable nodular density in the right lung apex.  No new findings.
IMPRESSION: Chronic and postoperative changes ? no active process or interval change.

## 2005-10-23 ENCOUNTER — Ambulatory Visit: Payer: Self-pay | Admitting: Internal Medicine

## 2005-10-24 LAB — CBC WITH DIFFERENTIAL/PLATELET
Basophils Absolute: 0 10*3/uL (ref 0.0–0.1)
Eosinophils Absolute: 0.3 10*3/uL (ref 0.0–0.5)
HCT: 37.4 % (ref 34.8–46.6)
HGB: 12.7 g/dL (ref 11.6–15.9)
LYMPH%: 39.8 % (ref 14.0–48.0)
MONO#: 0.5 10*3/uL (ref 0.1–0.9)
NEUT%: 43.1 % (ref 39.6–76.8)
Platelets: 345 10*3/uL (ref 145–400)
WBC: 4.6 10*3/uL (ref 3.9–10.0)
lymph#: 1.8 10*3/uL (ref 0.9–3.3)

## 2005-10-24 LAB — COMPREHENSIVE METABOLIC PANEL
ALT: 17 U/L (ref 0–40)
CO2: 27 mEq/L (ref 19–32)
Sodium: 140 mEq/L (ref 135–145)
Total Bilirubin: 0.5 mg/dL (ref 0.3–1.2)
Total Protein: 6.7 g/dL (ref 6.0–8.3)

## 2005-10-25 ENCOUNTER — Ambulatory Visit (HOSPITAL_COMMUNITY): Admission: RE | Admit: 2005-10-25 | Discharge: 2005-10-25 | Payer: Self-pay | Admitting: Internal Medicine

## 2005-10-25 IMAGING — CT CT CHEST W/ CM
2 of 4 series · 15 of 36 positions shown, 18 images · IV contrast (omnipaque)
Comparison: [DATE].

CLINICAL DATA: Follow-up lung cancer.  
CHEST CT WITH CONTRAST:
TECHNIQUE: Multidetector CT imaging of the chest was performed following the standard protocol during bolus administration of intravenous contrast.
Contrast:  80 cc Omnipaque 300.

[Series 2: chest_routine 5.0 b40f st · axial · 0.63mm/px · z∈[-308,-28]mm · 12 of 66 slices shown, 15 images]
[im 5/66  mediastinal]
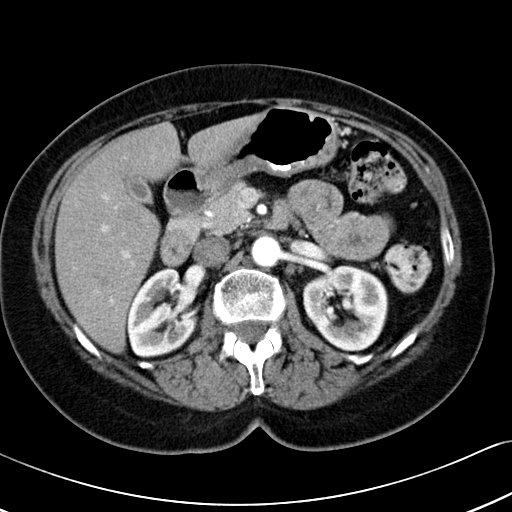
[im 5/66  lung]
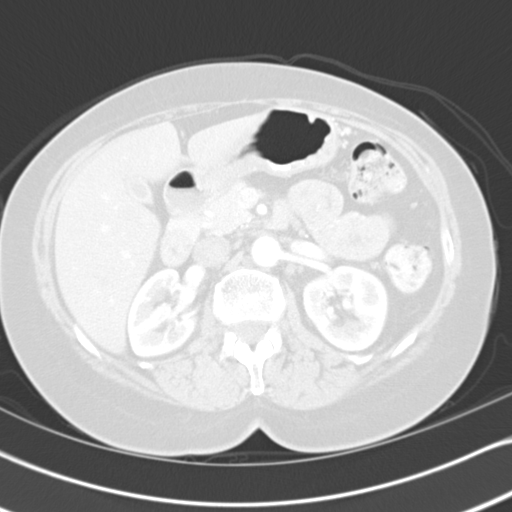
[im 10/66  lung]
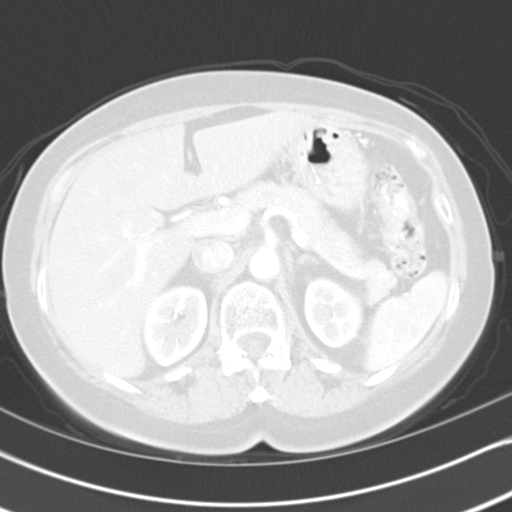
[im 14/66  lung]
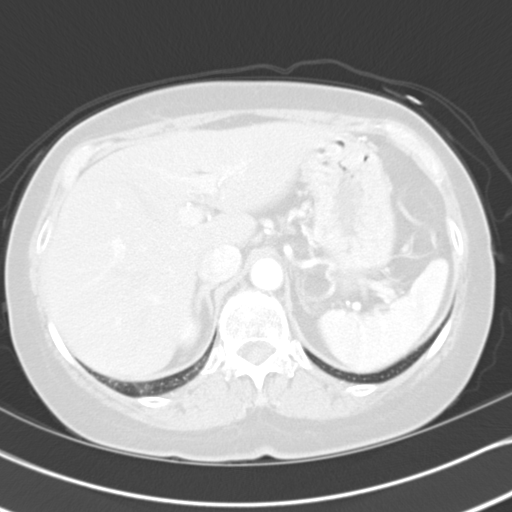
[im 19/66  lung]
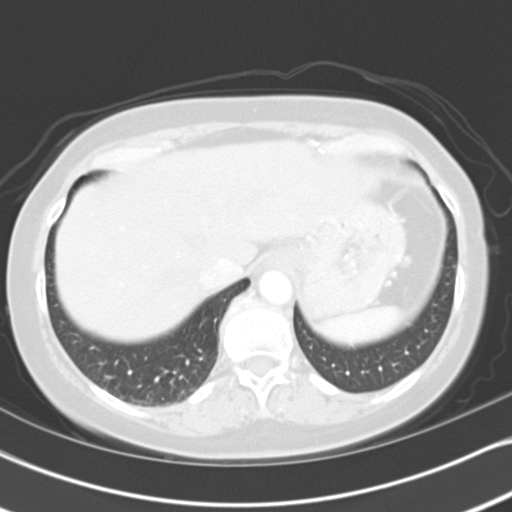
[im 24/66  mediastinal]
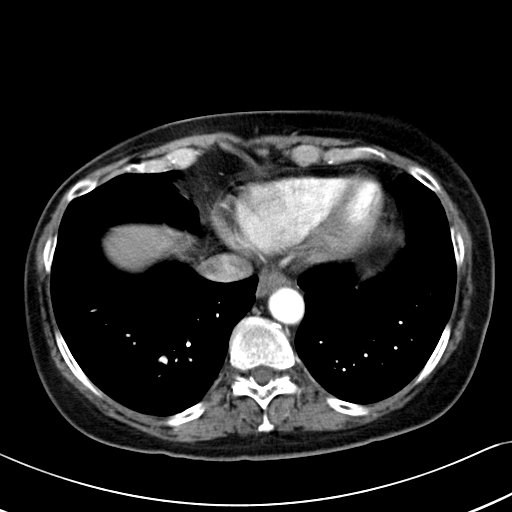
[im 24/66  lung]
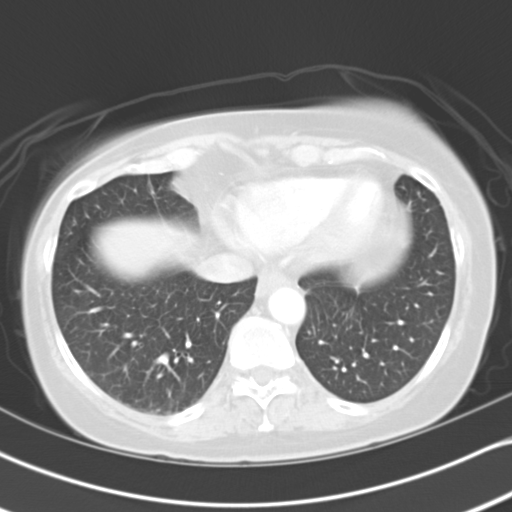
[im 28/66  lung]
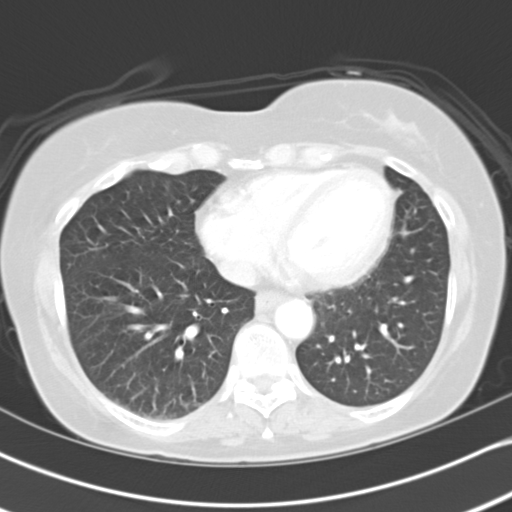
[im 38/66  lung]
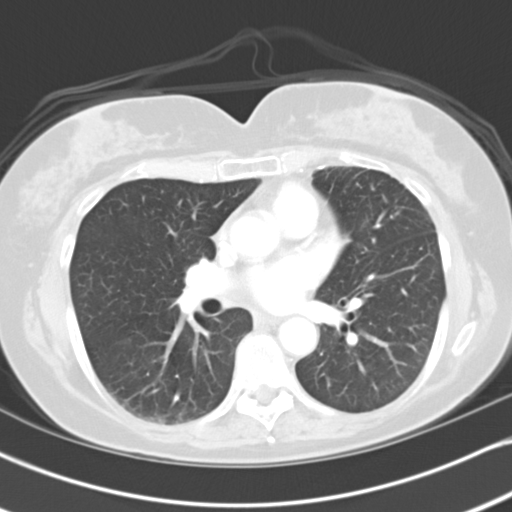
[im 42/66  lung]
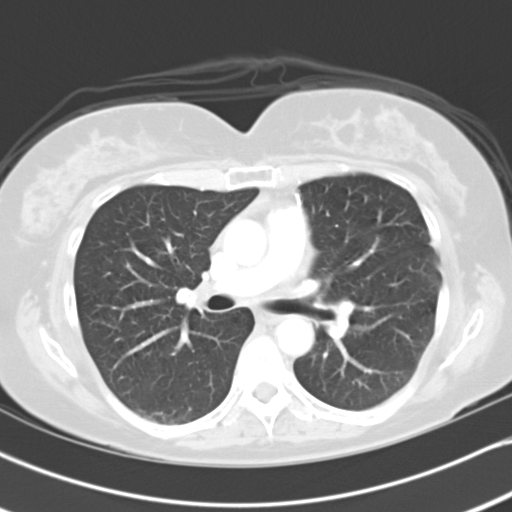
[im 47/66  mediastinal]
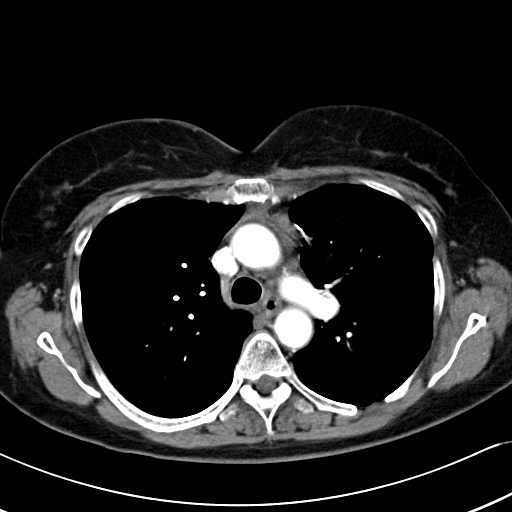
[im 47/66  lung]
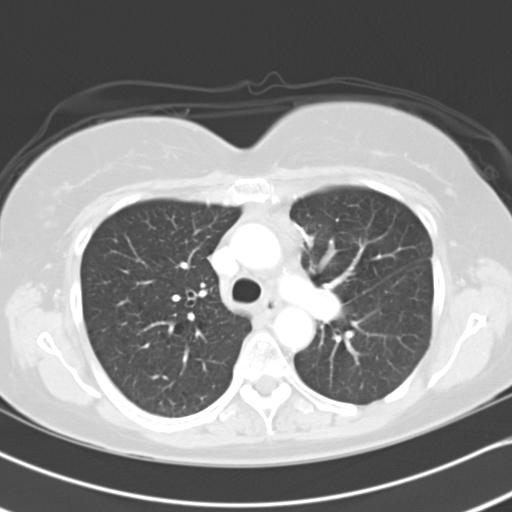
[im 52/66  lung]
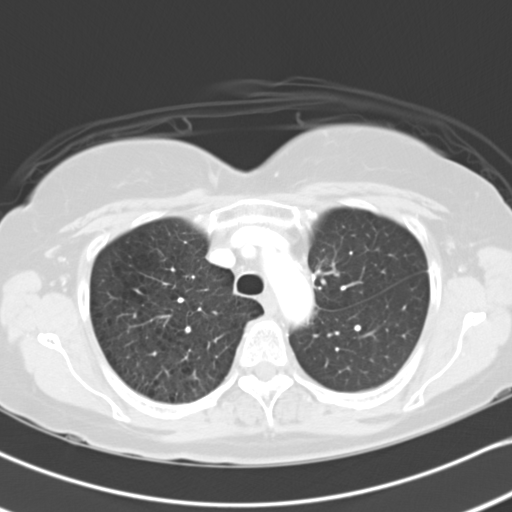
[im 56/66  lung]
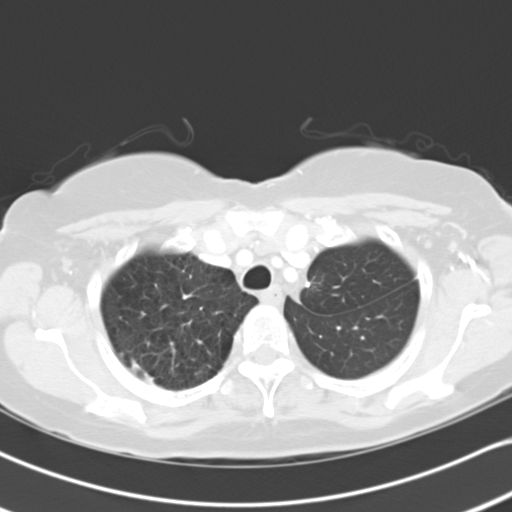
[im 61/66  lung]
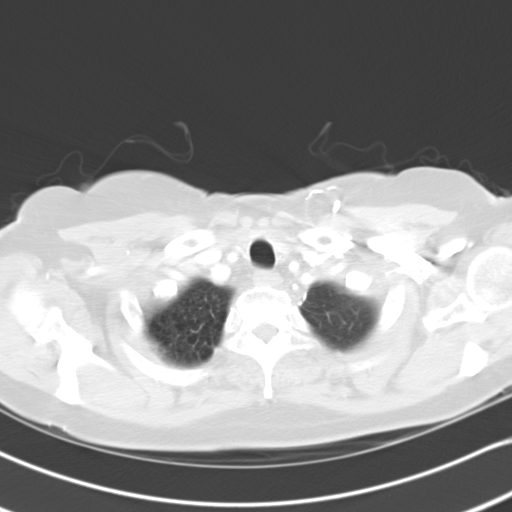

[Series 602: coronal images · coronal · 0.66mm/px · 3 of 39 slices shown]
[im 8/39  lung]
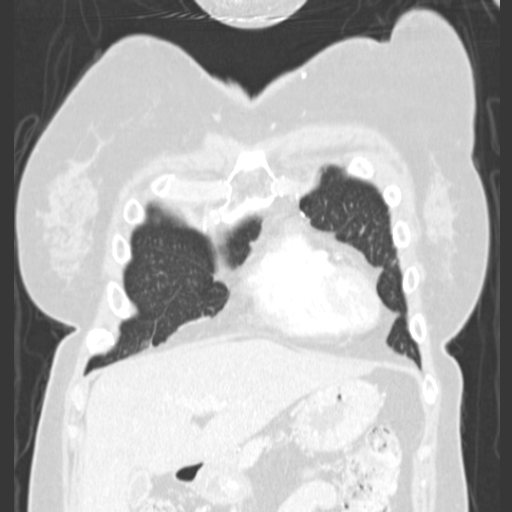
[im 16/39  lung]
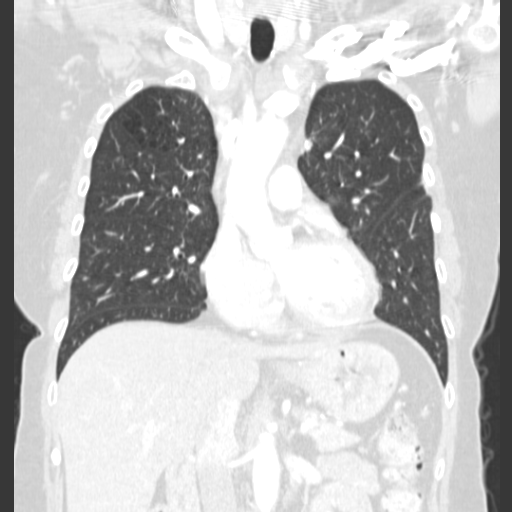
[im 23/39  lung]
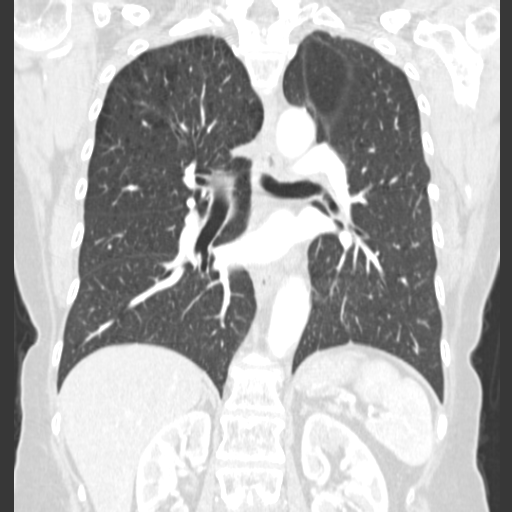

[15 of 36 positions shown; findings below may reference images not displayed]

FINDINGS: Small nodule in the posterior aspect of the right lobe of the thyroid gland is again noted.  No pathologically enlarged axillary lymph nodes are identified.  
No enlarged pre-vascular right paratracheal precarinal AP window or subcarinal lymphadenopathy is noted.  There are no enlarged hilar lymph nodes.  
No pleural or pericardial effusions are noted.  There are postoperative changes within the medial right upper lobe.  There is minimal residual soft tissue adjacent to the suture line.  This soft tissue likely represents postoperative scarring and when compared with the prior exam is stable.  Continued interval follow-up to this area is recommended however.  
There is a faint nodule within the right upper lobe.  This nodule is clearly larger when compared to the prior exam currently measuring 7.7 mm in diameter compared with 4.1 mm previously (image 36).  Nodule at right lung apex (image 14) is stable at 4.6 mm.  
Review of the bone windows show no lytic or sclerotic lesions.  
Limited images through the upper abdomen are unremarkable.
IMPRESSION: 1.  Interval increase of size of right upper lobe pulmonary nodule.  This may represent a focus of metastasis or a primary neoplasm.  Close interval follow-up is recommended.  
  2.  Stable appearance of the postoperative changes within the medial left upper lobe without evidence for local recurrence.

## 2006-01-25 ENCOUNTER — Ambulatory Visit: Payer: Self-pay | Admitting: Internal Medicine

## 2006-01-29 ENCOUNTER — Ambulatory Visit (HOSPITAL_COMMUNITY): Admission: RE | Admit: 2006-01-29 | Discharge: 2006-01-29 | Payer: Self-pay | Admitting: Internal Medicine

## 2006-01-29 LAB — CBC WITH DIFFERENTIAL/PLATELET
BASO%: 0.4 % (ref 0.0–2.0)
Basophils Absolute: 0 10*3/uL (ref 0.0–0.1)
EOS%: 5.5 % (ref 0.0–7.0)
HCT: 38.1 % (ref 34.8–46.6)
HGB: 13.1 g/dL (ref 11.6–15.9)
LYMPH%: 37.1 % (ref 14.0–48.0)
MCH: 30.8 pg (ref 26.0–34.0)
MCHC: 34.3 g/dL (ref 32.0–36.0)
MONO#: 0.5 10*3/uL (ref 0.1–0.9)
NEUT%: 47.8 % (ref 39.6–76.8)
Platelets: 343 10*3/uL (ref 145–400)
lymph#: 1.9 10*3/uL (ref 0.9–3.3)

## 2006-01-29 LAB — COMPREHENSIVE METABOLIC PANEL
ALT: 15 U/L (ref 0–35)
BUN: 15 mg/dL (ref 6–23)
CO2: 25 mEq/L (ref 19–32)
Calcium: 9.1 mg/dL (ref 8.4–10.5)
Chloride: 105 mEq/L (ref 96–112)
Creatinine, Ser: 0.8 mg/dL (ref 0.40–1.20)
Total Bilirubin: 0.4 mg/dL (ref 0.3–1.2)

## 2006-01-29 IMAGING — CT CT CHEST W/ CM
2 of 4 series · 15 of 36 positions shown, 18 images · non-contrast
Comparison: [DATE]

CLINICAL DATA: Lung cancer
TECHNIQUE: Multidetector CT imaging of the chest was performed following the
standard protocol during bolus administration of intravenous contrast.

[Series 2: chest_routine 5.0 b40f st · axial · 0.62mm/px · z∈[+906,+1200]mm · 12 of 69 slices shown, 15 images]
[im 5/69  mediastinal]
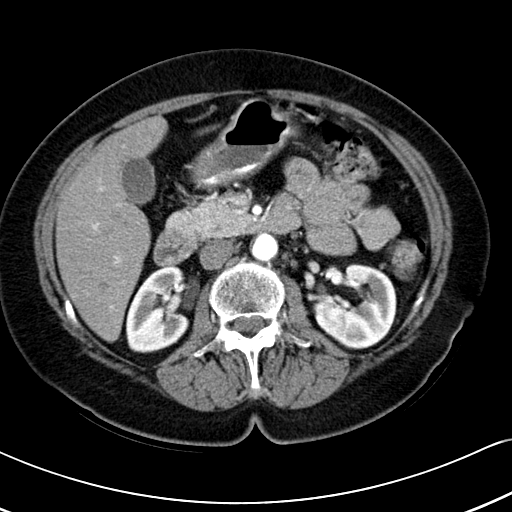
[im 5/69  lung]
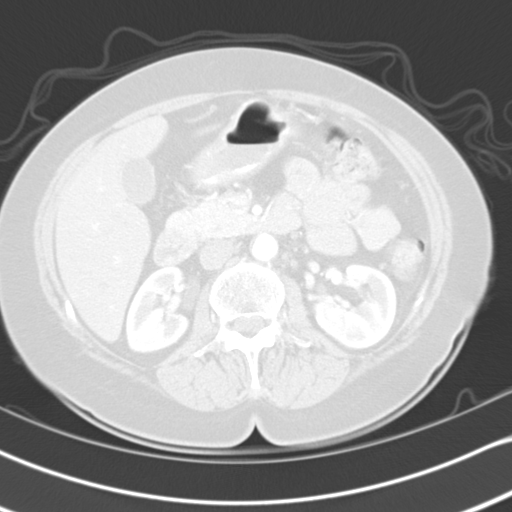
[im 10/69  lung]
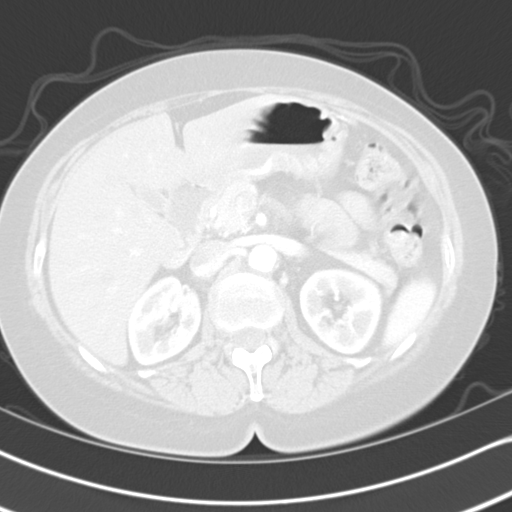
[im 15/69  lung]
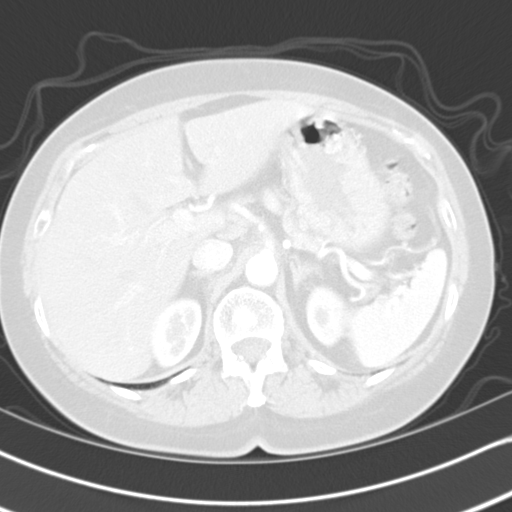
[im 20/69  lung]
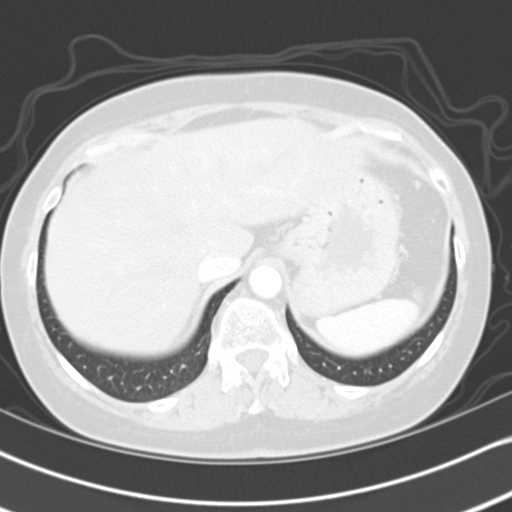
[im 25/69  mediastinal]
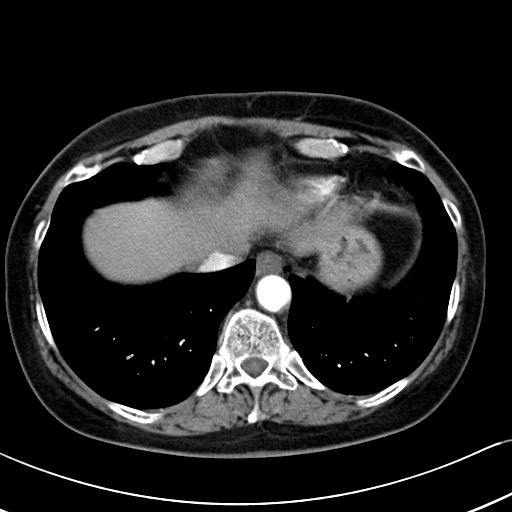
[im 25/69  lung]
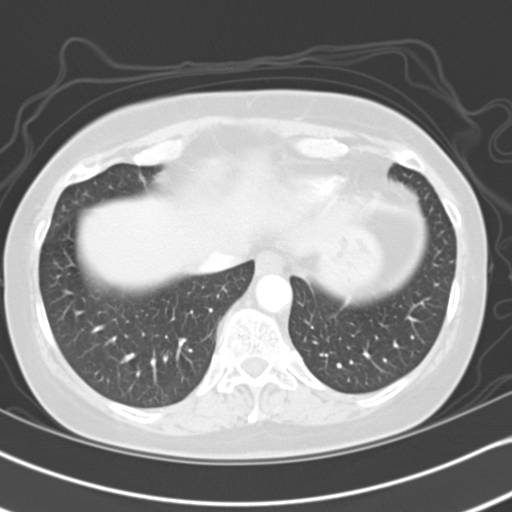
[im 30/69  lung]
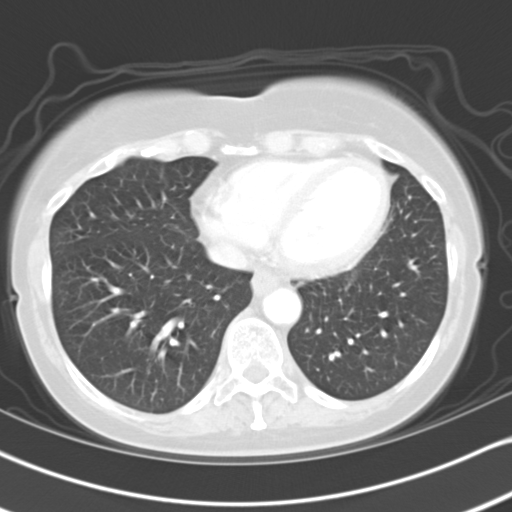
[im 39/69  lung]
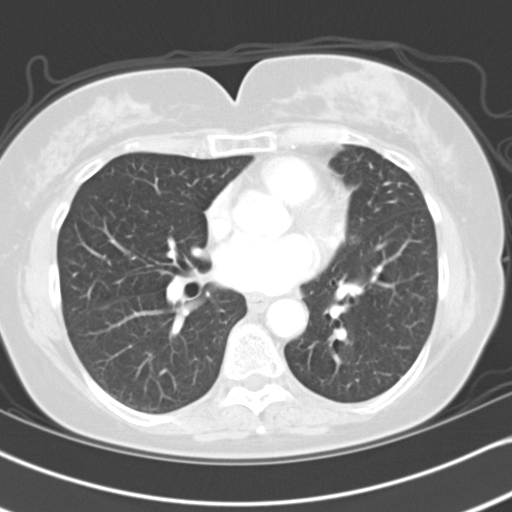
[im 44/69  lung]
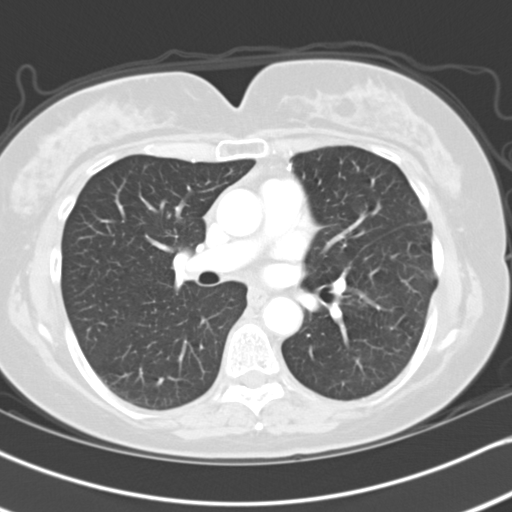
[im 49/69  mediastinal]
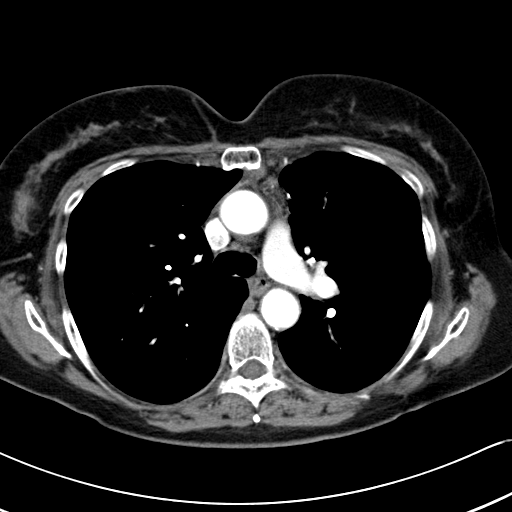
[im 49/69  lung]
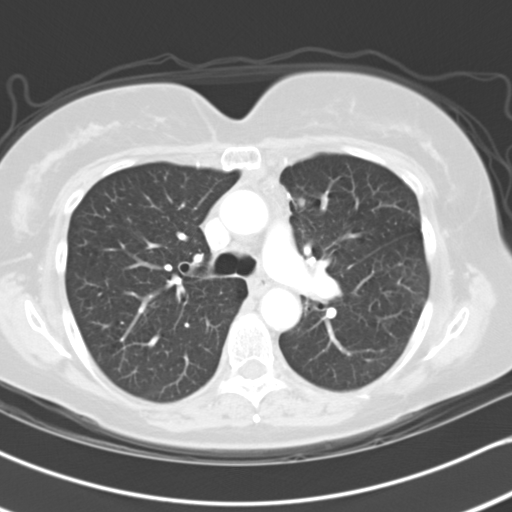
[im 54/69  lung]
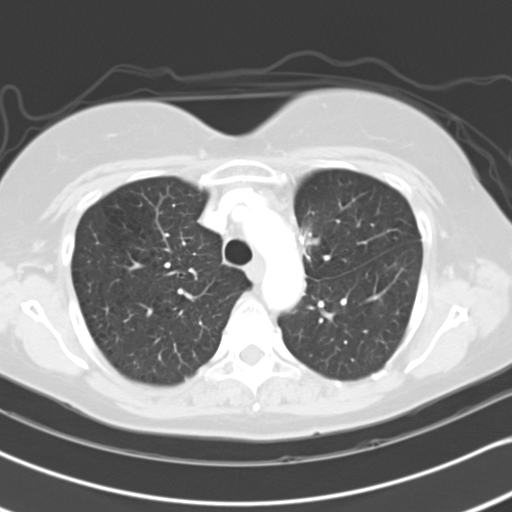
[im 59/69  lung]
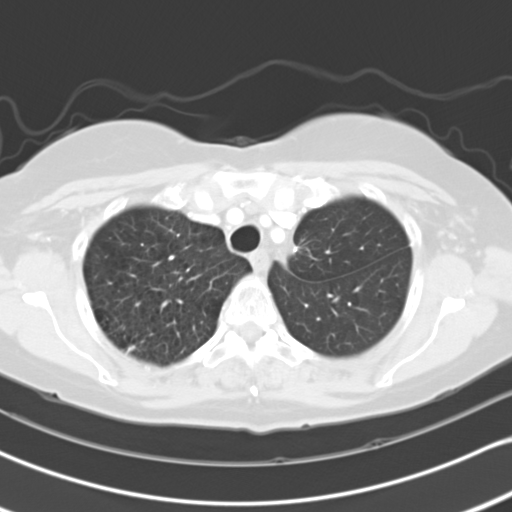
[im 64/69  lung]
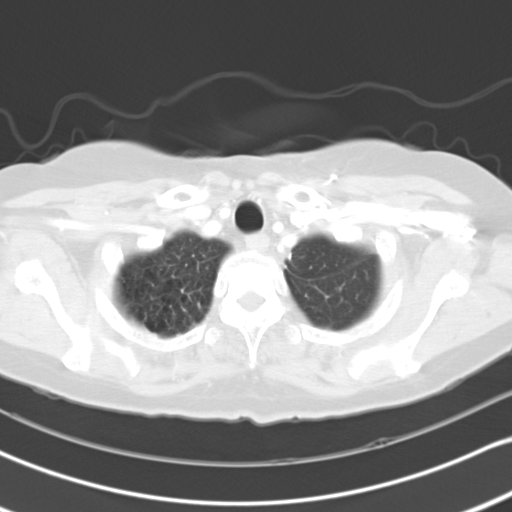

[Series 602: coronal images · coronal · 0.69mm/px · 3 of 73 slices shown]
[im 15/73  lung]
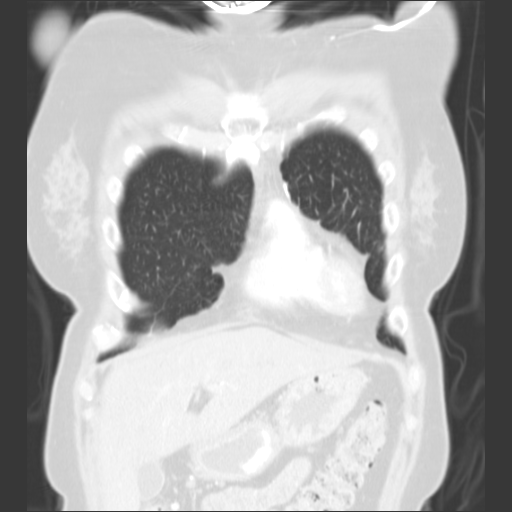
[im 29/73  lung]
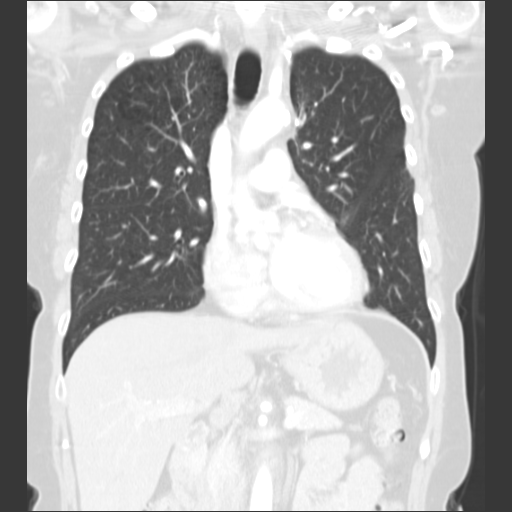
[im 44/73  lung]
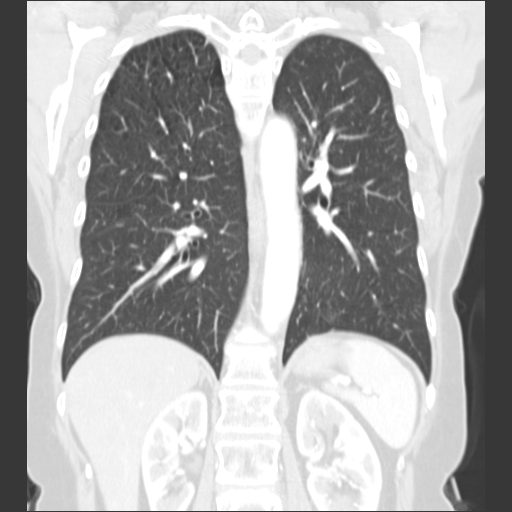

[15 of 36 positions shown; findings below may reference images not displayed]

Contrast:  80 cc Omnipaque 300

CHEST CT WITH CONTRAST:

No axillary adenopathy. Stable right thyroid nodule. The soft tissue attenuation
anterior mediastinum is unchanged. No precarinal, subcarinal, or hilar
lymphadenopathy. Heart size is stable. No pericardial or pleural effusion.

Lung windows reveal bilateral upper lobe emphysema. Pleural-parenchymal scarring
in the posterior right upper lobe is unchanged. Stable 3-4 mm posterior right
upper lobe pulmonary nodule. 8mm right middle lobe pulmonary nodule on image 37
was described in the right upper lobe previously. This nodule is unchanged.
Postsurgical change again seen in the medial left upper lobe with suture
material seen along the medial pleura. The small amount of associated soft
tissue attenuation along the suture line is unchanged.

There is some is very subtle heterogeneity in the anterior segment of the right
liver on image 57. A similar appearance is seen on the prior scan and this is
probably related to unopacified hepatic venous anatomy. The adrenal glands are
unremarkable.
IMPRESSION: Stable exam. The previously described enlarging right-sided pulmonary nodule is
seen in the peripheral right middle lobe today on image 37. This is unchanged in
the 3 month interval since the prior scan.

Postsurgical change in the medial left upper lobe without evidence for
progression.

## 2006-05-24 ENCOUNTER — Ambulatory Visit: Payer: Self-pay | Admitting: Internal Medicine

## 2006-05-28 ENCOUNTER — Ambulatory Visit (HOSPITAL_COMMUNITY): Admission: RE | Admit: 2006-05-28 | Discharge: 2006-05-28 | Payer: Self-pay | Admitting: Internal Medicine

## 2006-05-28 LAB — CBC WITH DIFFERENTIAL/PLATELET
BASO%: 0.5 % (ref 0.0–2.0)
EOS%: 6.3 % (ref 0.0–7.0)
LYMPH%: 40.8 % (ref 14.0–48.0)
MCHC: 34.5 g/dL (ref 32.0–36.0)
MONO#: 0.5 10*3/uL (ref 0.1–0.9)
RBC: 4.07 10*6/uL (ref 3.70–5.32)
WBC: 4.7 10*3/uL (ref 3.9–10.0)
lymph#: 1.9 10*3/uL (ref 0.9–3.3)

## 2006-05-28 LAB — COMPREHENSIVE METABOLIC PANEL
ALT: 15 U/L (ref 0–35)
CO2: 26 mEq/L (ref 19–32)
Creatinine, Ser: 0.81 mg/dL (ref 0.40–1.20)
Glucose, Bld: 92 mg/dL (ref 70–99)
Total Bilirubin: 0.4 mg/dL (ref 0.3–1.2)

## 2006-05-28 IMAGING — CT CT CHEST W/ CM
2 of 3 series · 15 of 36 positions shown, 18 images · IV contrast (omnipaque)
Comparison: [REDACTED] chest CT of [DATE].

CLINICAL DATA: Post left upper lobectomy for lung cancer.  Chemotherapy. 
CHEST CT WITH CONTRAST:
TECHNIQUE: Multidetector CT imaging of the chest was performed following the standard protocol during bolus administration of intravenous contrast.
Contrast:  80 cc Omnipaque 300.

[Series 2: chest routine 5.0 b40f · axial · 0.69mm/px · z∈[-302,-12]mm · 12 of 68 slices shown, 15 images]
[im 5/68  mediastinal]
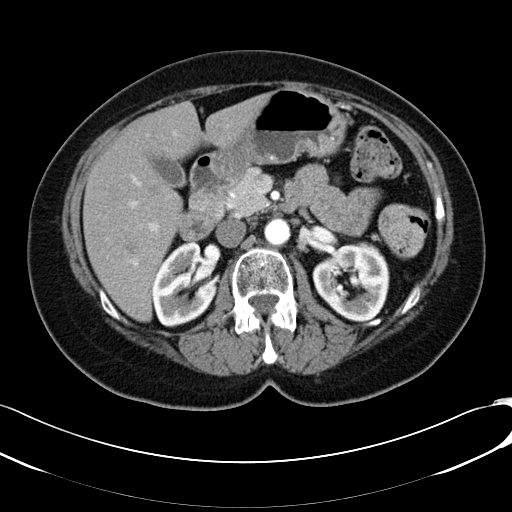
[im 5/68  lung]
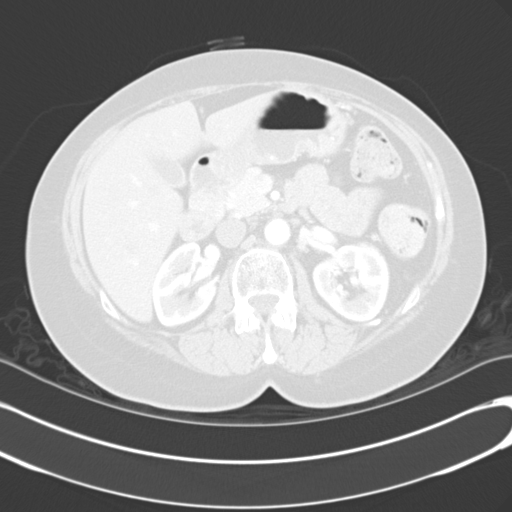
[im 10/68  lung]
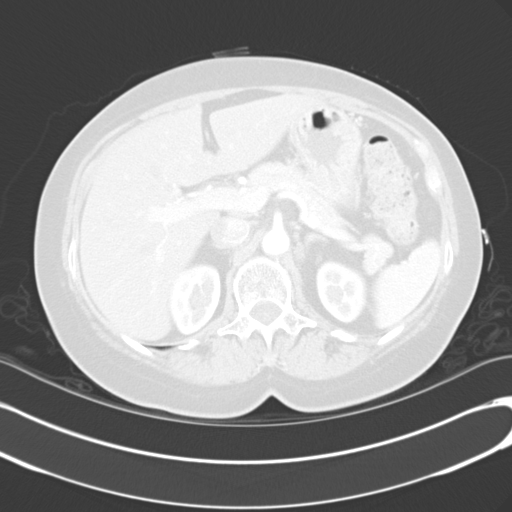
[im 15/68  lung]
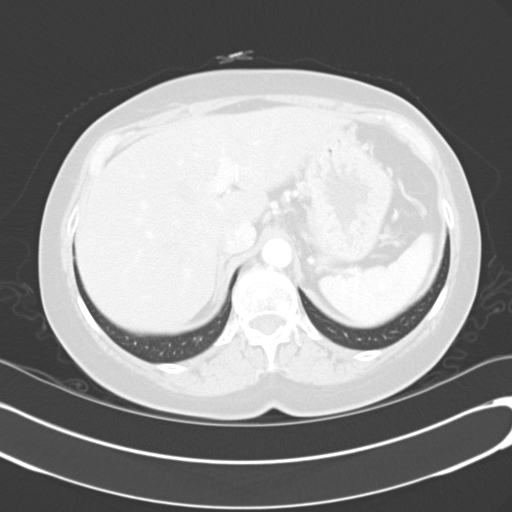
[im 20/68  lung]
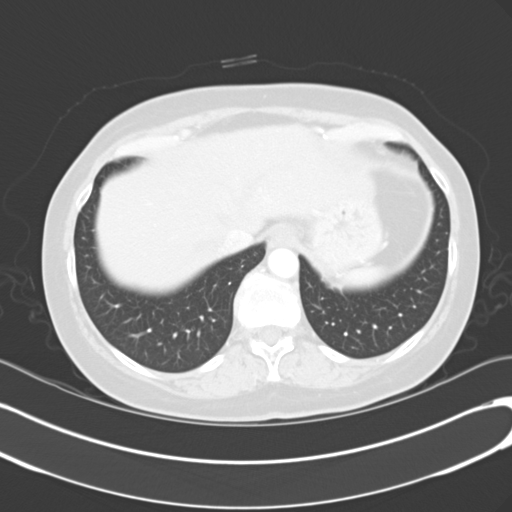
[im 25/68  mediastinal]
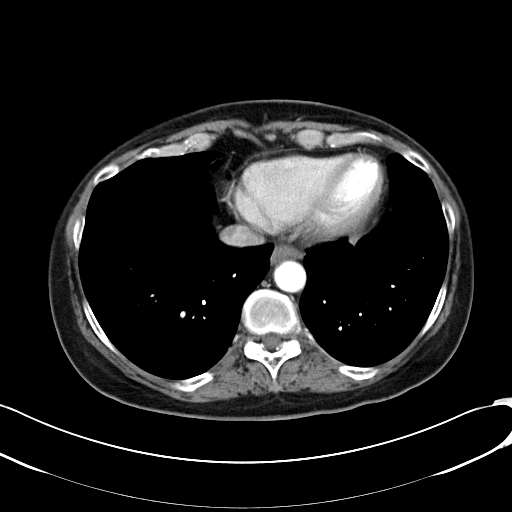
[im 25/68  lung]
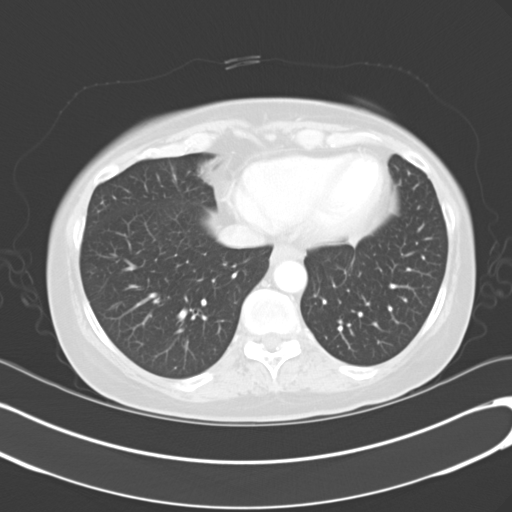
[im 30/68  lung]
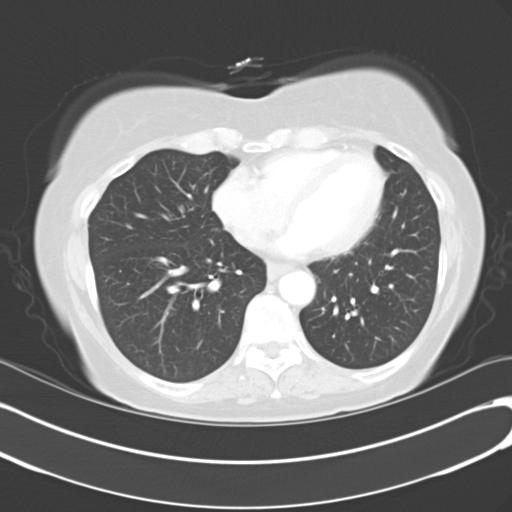
[im 38/68  lung]
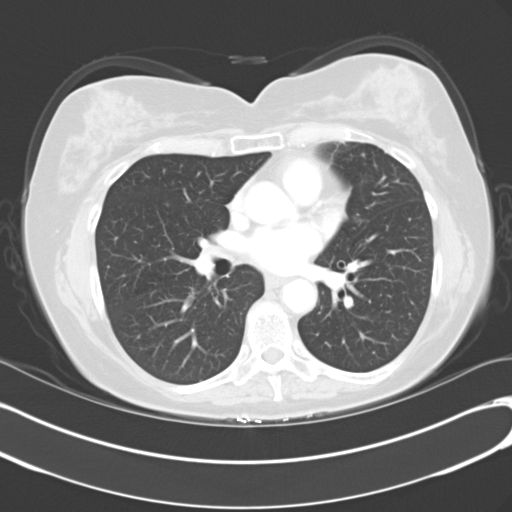
[im 43/68  lung]
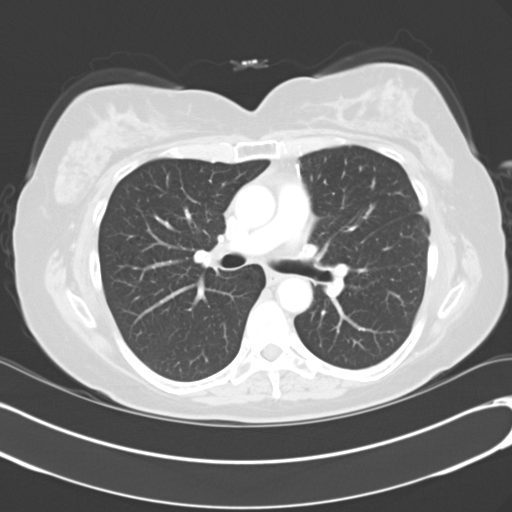
[im 48/68  mediastinal]
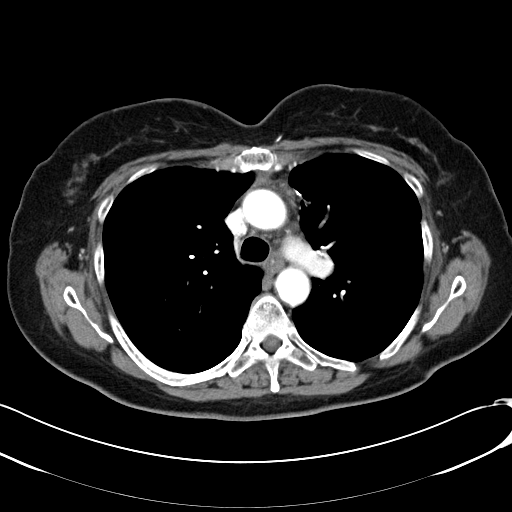
[im 48/68  lung]
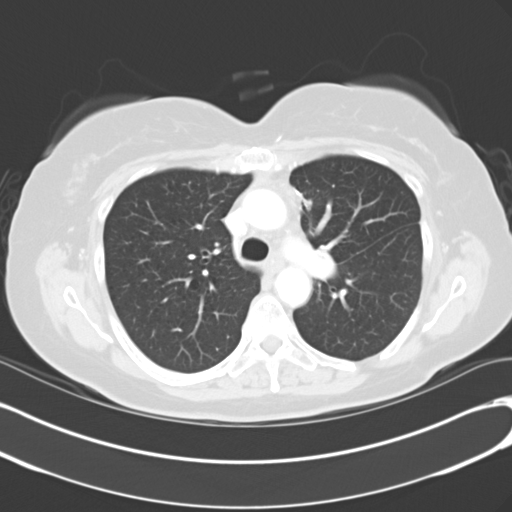
[im 53/68  lung]
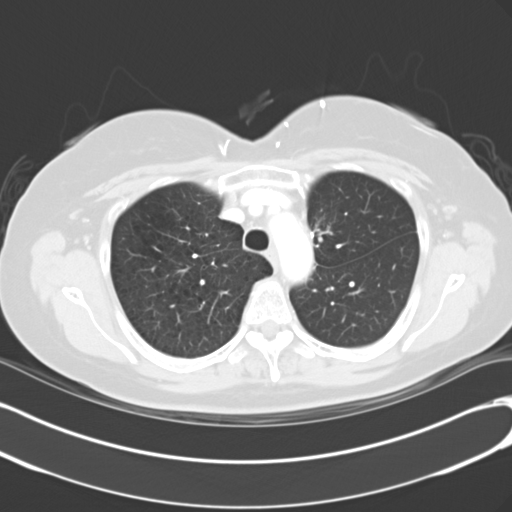
[im 58/68  lung]
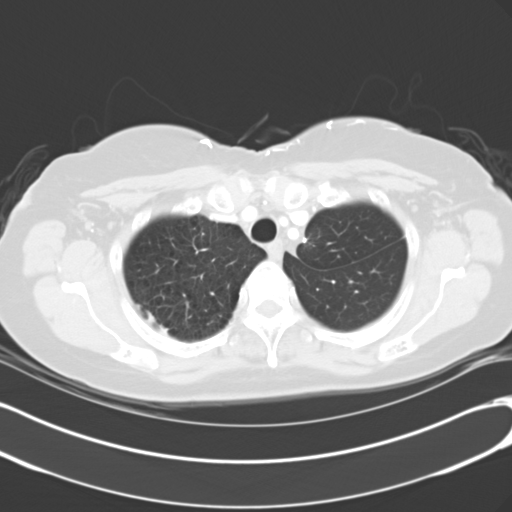
[im 63/68  lung]
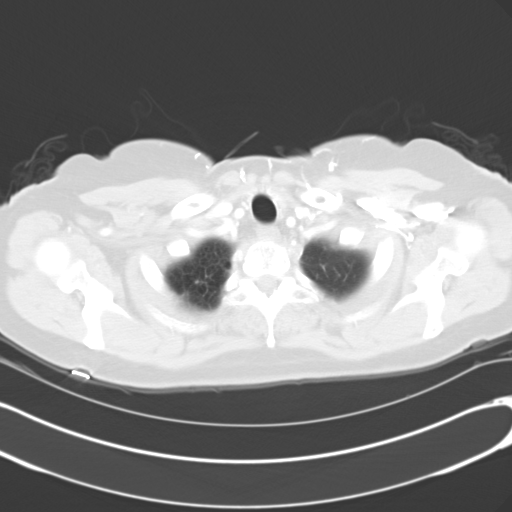

[Series 602: <mpr thick range> · coronal · 0.69mm/px · 3 of 60 slices shown]
[im 12/60  lung]
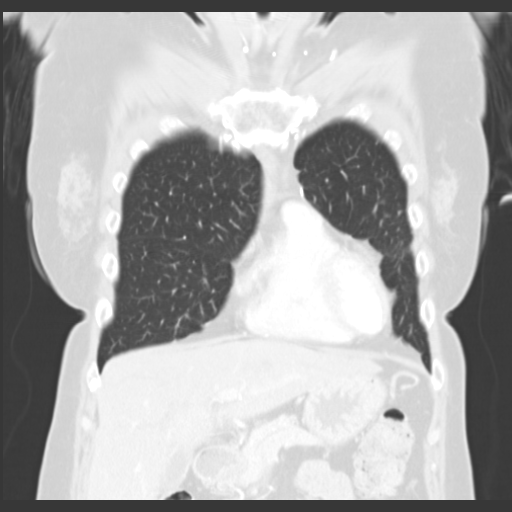
[im 24/60  lung]
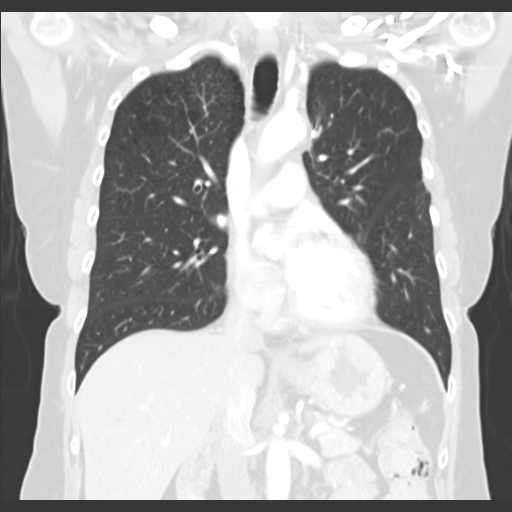
[im 36/60  lung]
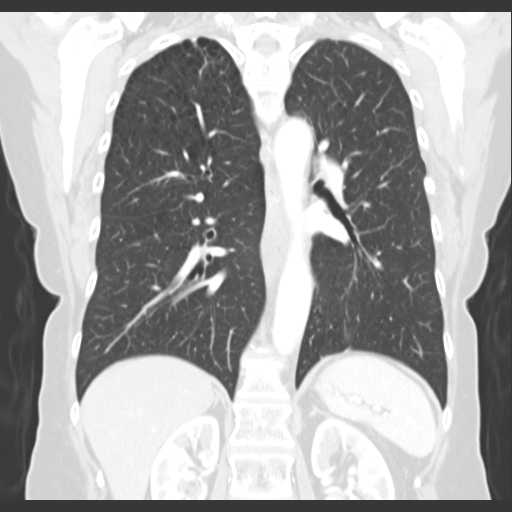

[15 of 36 positions shown; findings below may reference images not displayed]

FINDINGS: Stable findings are seen to include left upper lobectomy with biapical pleural parenchymal scarring and right upper lobe slight emphysema.  No change in 3 mm small irregular nodular focus posterior right upper lobe (image 15) and 8 mm lateral segment right middle lobe noncalcified nodules.  The lungs are otherwise clear.  Stable diffuse mediastinal soft tissue density is seen with no locally recurrent or metastatic mediastinal, hilar or axillary mass/adenopathy.  Thyroid gland is stable.  Heart size appears normal.  Upper abdominal organs appear normal without osseous metastatic disease.
IMPRESSION: 1. Stable 8 mm lateral segment right middle lobe nodule with no locally recurrent or metastatic lung cancer seen.
2. Left upper lobectomy with chronic lung changes seen, primarily at the apices.
3. Slight right upper lobe emphysema.  
4. Otherwise no active disease.

## 2006-11-21 ENCOUNTER — Ambulatory Visit: Payer: Self-pay | Admitting: Internal Medicine

## 2006-11-26 ENCOUNTER — Ambulatory Visit (HOSPITAL_COMMUNITY): Admission: RE | Admit: 2006-11-26 | Discharge: 2006-11-26 | Payer: Self-pay | Admitting: Internal Medicine

## 2006-11-26 LAB — CBC WITH DIFFERENTIAL/PLATELET
Basophils Absolute: 0.1 10*3/uL (ref 0.0–0.1)
Eosinophils Absolute: 0.3 10*3/uL (ref 0.0–0.5)
HCT: 38.1 % (ref 34.8–46.6)
HGB: 13.3 g/dL (ref 11.6–15.9)
LYMPH%: 36.7 % (ref 14.0–48.0)
MONO#: 0.4 10*3/uL (ref 0.1–0.9)
NEUT#: 2.4 10*3/uL (ref 1.5–6.5)
NEUT%: 47.5 % (ref 39.6–76.8)
Platelets: 280 10*3/uL (ref 145–400)
WBC: 5 10*3/uL (ref 3.9–10.0)

## 2006-11-26 LAB — COMPREHENSIVE METABOLIC PANEL
CO2: 28 mEq/L (ref 19–32)
Calcium: 9 mg/dL (ref 8.4–10.5)
Creatinine, Ser: 0.79 mg/dL (ref 0.40–1.20)
Glucose, Bld: 90 mg/dL (ref 70–99)
Total Bilirubin: 0.8 mg/dL (ref 0.3–1.2)

## 2006-11-26 IMAGING — CT CT CHEST W/ CM
1 of 3 series · 15 of 31 positions shown, 19 images · IV contrast (omnipaque)
Comparison: [DATE]

CLINICAL DATA: Lung cancer

CHEST CT WITH CONTRAST
TECHNIQUE: Multidetector CT imaging of the chest was performed following the
standard protocol during bolus administration of intravenous contrast.
Contrast:  80 cc Omnipaque 300

[Series 3: thins for (person_name) recon · axial · 0.62mm/px · z∈[-322,-25]mm · 15 of 471 slices shown, 19 images]
[im 23/471  mediastinal]
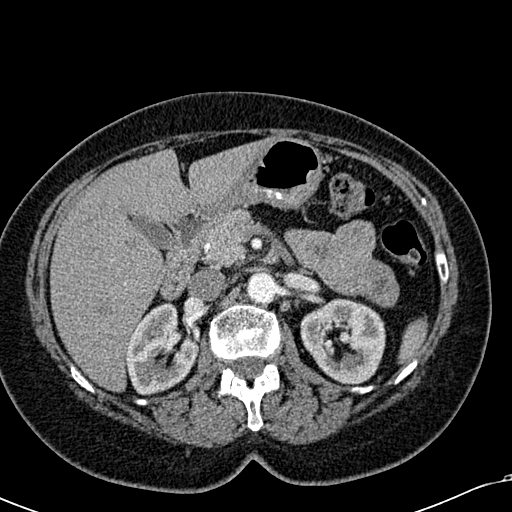
[im 23/471  lung]
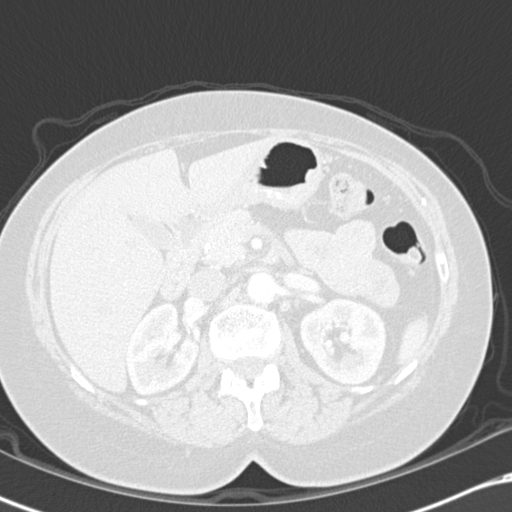
[im 68/471  lung]
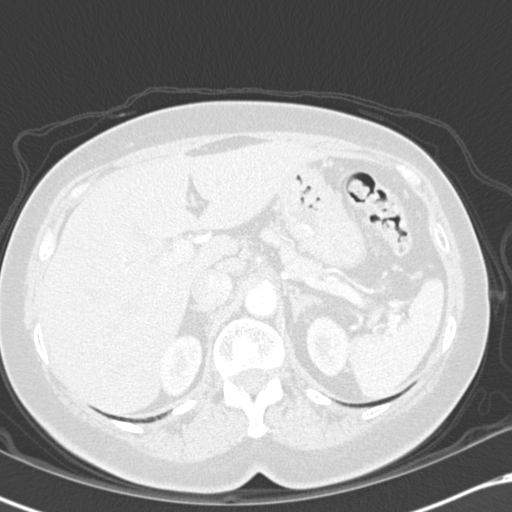
[im 90/471  lung]
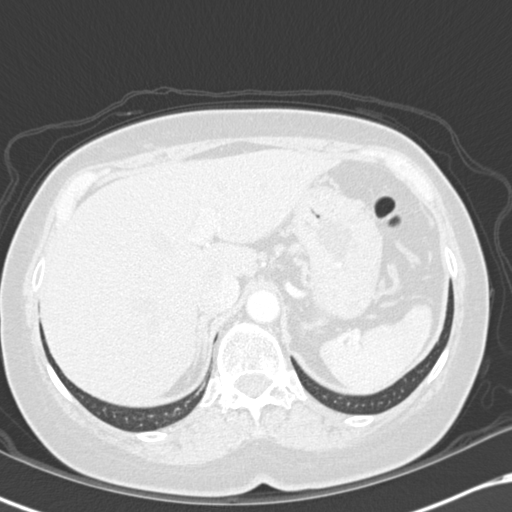
[im 112/471  lung]
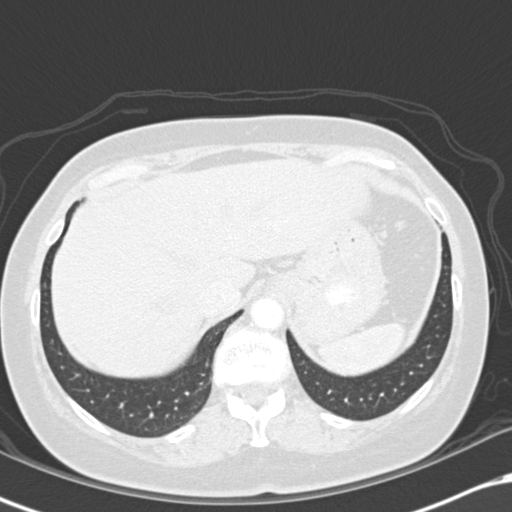
[im 157/471  mediastinal]
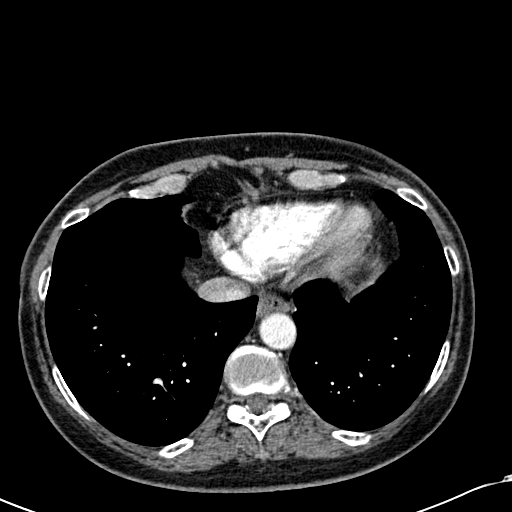
[im 157/471  lung]
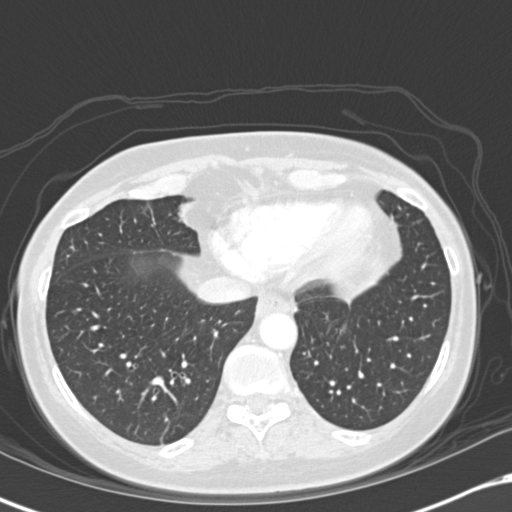
[im 180/471  lung]
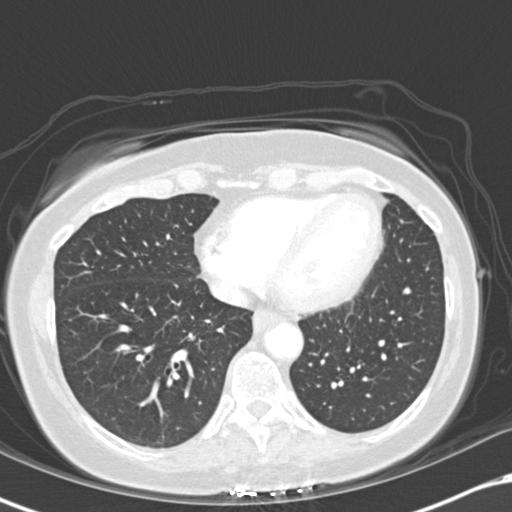
[im 222/471  lung]
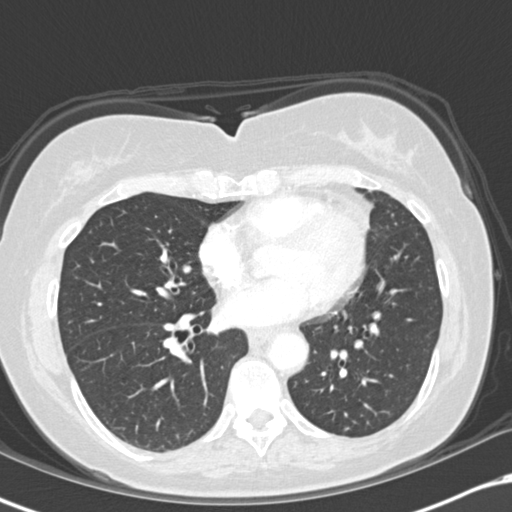
[im 224/471  lung]
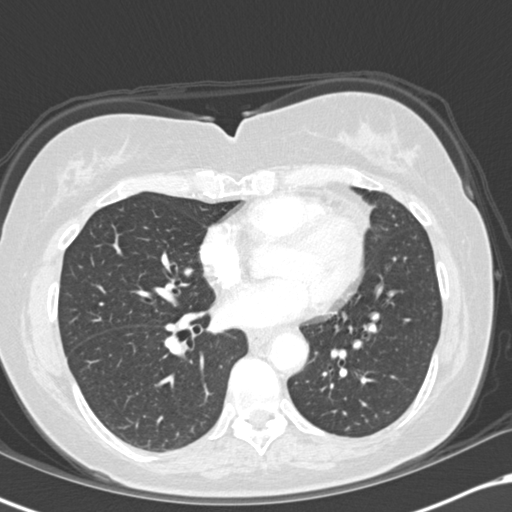
[im 247/471  mediastinal]
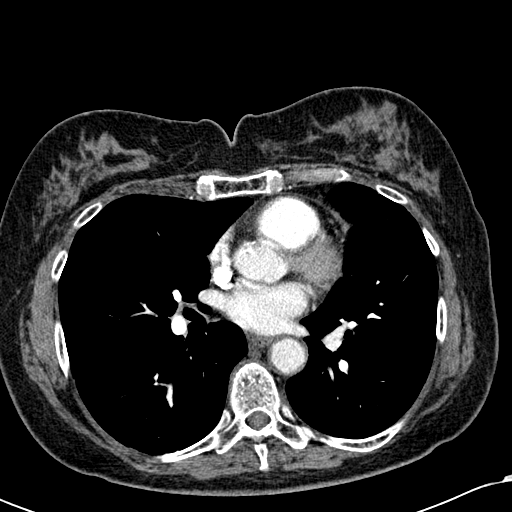
[im 247/471  lung]
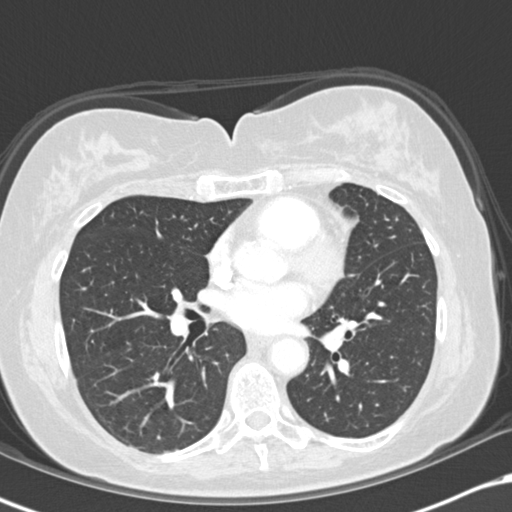
[im 291/471  lung]
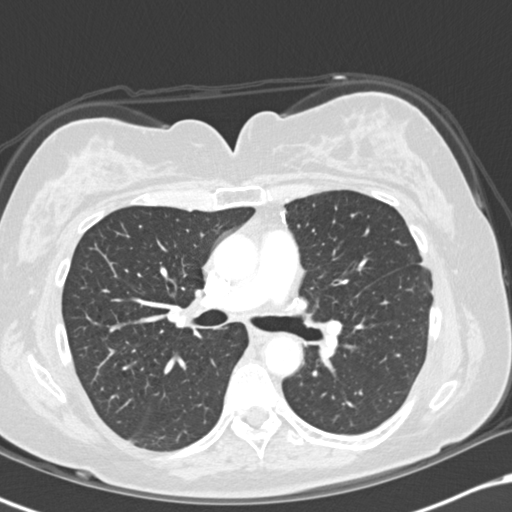
[im 314/471  lung]
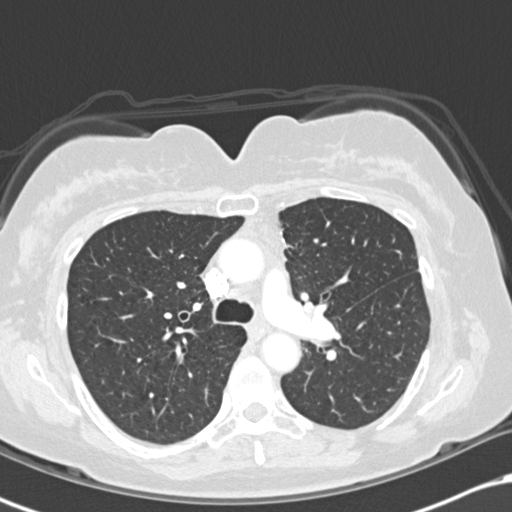
[im 359/471  lung]
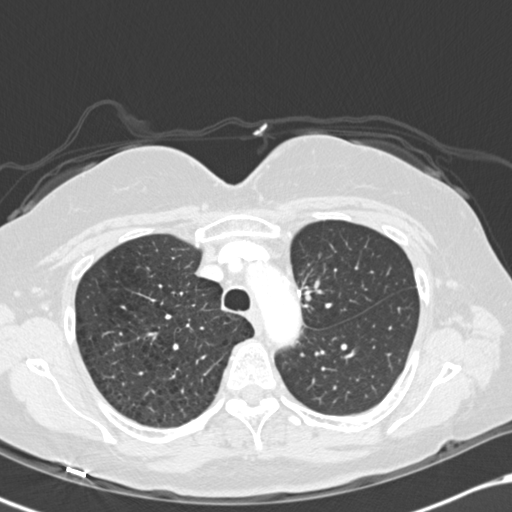
[im 381/471  mediastinal]
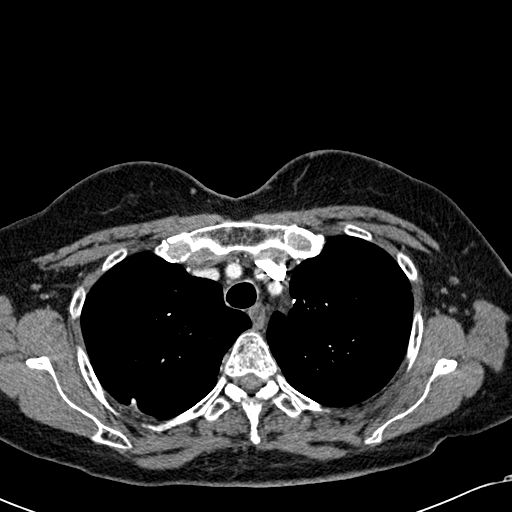
[im 381/471  lung]
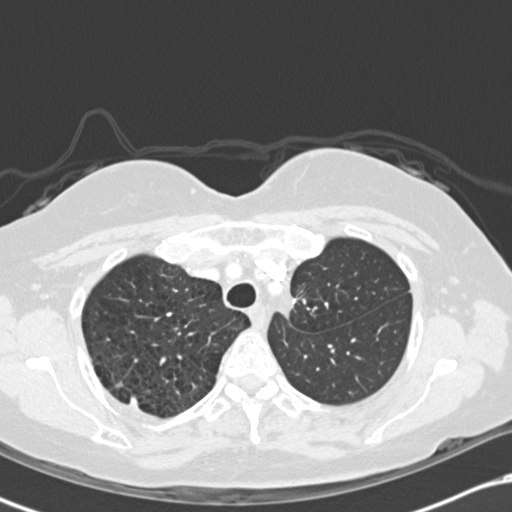
[im 403/471  lung]
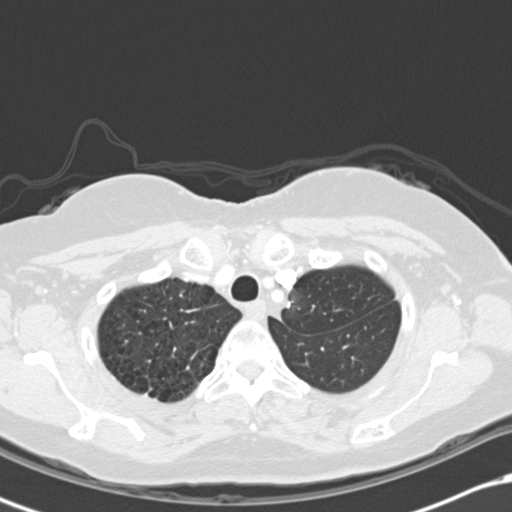
[im 448/471  lung]
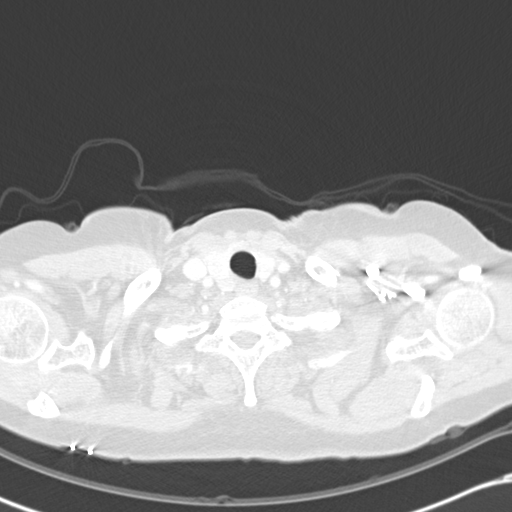

[15 of 31 positions shown; findings below may reference images not displayed]

FINDINGS: No enlarged supraclavicular or axillary lymph nodes.

There is no enlarged mediastinal, or hilar lymph nodes.

No pericardial or pleural effusion.

The lungs are emphysematous.

3.5 mm nodule in the right upper lobe, image 16 is stable.

Right middle lobe subpleural parenchymal density, image 35, is unchanged when
compared with the prior exam; this measures approximately 0.9 mm. 

Right middle lobe pulmonary nodule measures 7.1 mm, image 35. This is unchanged
compared to prior study.

The patient is status post left upper lobectomy.

There are no new or enlarging pulmonary nodules or masses.

Evaluation of the osseous structures shows no lytic or sclerotic lesions.

The thoracic aorta is unremarkable.

No evidence for pulmonary embolus.

Both adrenal glands have a normal appearance

IMPRESSION

1. Stable CT of the chest.

## 2007-05-13 ENCOUNTER — Ambulatory Visit (HOSPITAL_COMMUNITY): Admission: RE | Admit: 2007-05-13 | Discharge: 2007-05-13 | Payer: Self-pay | Admitting: Family Medicine

## 2007-05-13 IMAGING — NM NM HEPATO W/GB/PHARM/[PERSON_NAME]
2 series · 12 of 12 positions shown · non-contrast
Comparison: None.

CLINICAL DATA: Right upper quadrant pain.  
 NUCLEAR MEDICINE HEPATOBILIARY SCAN WITH EJECTION FRACTION:
TECHNIQUE: Sequential abdominal images were obtained following intravenous injection of radiopharmaceutical.  Sequential images were continued following oral ingestion of 8 oz. half-and-half, and the gallbladder ejection fraction was calculated.
 Radiopharmaceutical:  5.1 mCi [7K] Choletec and 8 oz. half-and-half creamer.

[hida · 2.40mm/px · 6 of 60 frames shown (1 of 2)]
[frame 6/60]
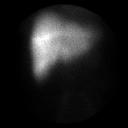
[frame 16/60]
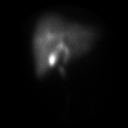
[frame 26/60]
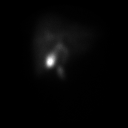
[frame 36/60]
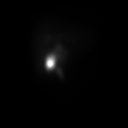
[frame 46/60]
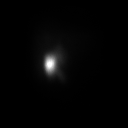
[frame 56/60]
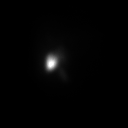

[hida · 2.40mm/px · 6 of 60 frames shown (2 of 2)]
[frame 6/60]
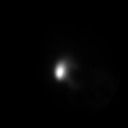
[frame 16/60]
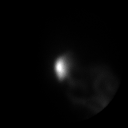
[frame 26/60]
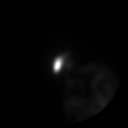
[frame 36/60]
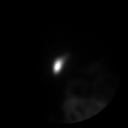
[frame 46/60]
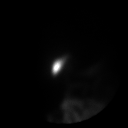
[frame 56/60]
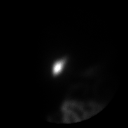

[12 of 12 positions shown; findings below may reference images not displayed]

FINDINGS: There is prompt visualization of the bile ducts, gallbladder, and small bowel. Gallbladder ejection fraction is calculated at 61.4%.  Normal is greater than 50%.
IMPRESSION: 1.  Patent cystic and common bile ducts. 
 2.  The gallbladder contracts physiologically.

## 2007-05-22 ENCOUNTER — Ambulatory Visit: Payer: Self-pay | Admitting: Internal Medicine

## 2007-05-27 LAB — COMPREHENSIVE METABOLIC PANEL
ALT: 16 U/L (ref 0–35)
AST: 15 U/L (ref 0–37)
BUN: 19 mg/dL (ref 6–23)
Creatinine, Ser: 0.92 mg/dL (ref 0.40–1.20)
Total Bilirubin: 0.4 mg/dL (ref 0.3–1.2)

## 2007-05-27 LAB — CBC WITH DIFFERENTIAL/PLATELET
BASO%: 0.5 % (ref 0.0–2.0)
Basophils Absolute: 0 10*3/uL (ref 0.0–0.1)
EOS%: 8 % — ABNORMAL HIGH (ref 0.0–7.0)
HCT: 38.4 % (ref 34.8–46.6)
LYMPH%: 37.8 % (ref 14.0–48.0)
MCH: 30.7 pg (ref 26.0–34.0)
MCHC: 34.1 g/dL (ref 32.0–36.0)
MCV: 90.1 fL (ref 81.0–101.0)
MONO%: 8.2 % (ref 0.0–13.0)
NEUT%: 45.5 % (ref 39.6–76.8)
Platelets: 328 10*3/uL (ref 145–400)

## 2007-05-28 ENCOUNTER — Ambulatory Visit (HOSPITAL_COMMUNITY): Admission: RE | Admit: 2007-05-28 | Discharge: 2007-05-28 | Payer: Self-pay | Admitting: Internal Medicine

## 2007-05-28 IMAGING — CT CT CHEST W/ CM
2 of 3 series · 16 of 36 positions shown, 20 images · IV contrast (omnipaque)
Comparison: [DATE]

CLINICAL DATA: Lung cancer

CHEST CT WITH CONTRAST
TECHNIQUE: Multidetector CT imaging of the chest was performed following the
standard protocol during bolus administration of intravenous contrast.
Contrast:  80 cc Omnipaque 300

[Series 2: chest routine 5.0 b40f · axial · 0.62mm/px · z∈[-358,-62]mm · 13 of 67 slices shown, 17 images]
[im 5/67  mediastinal]
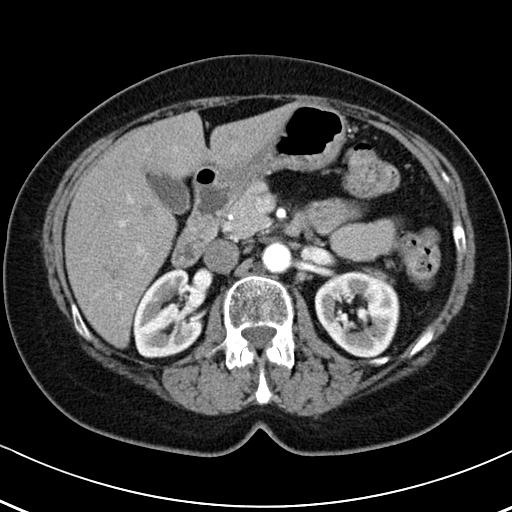
[im 5/67  lung]
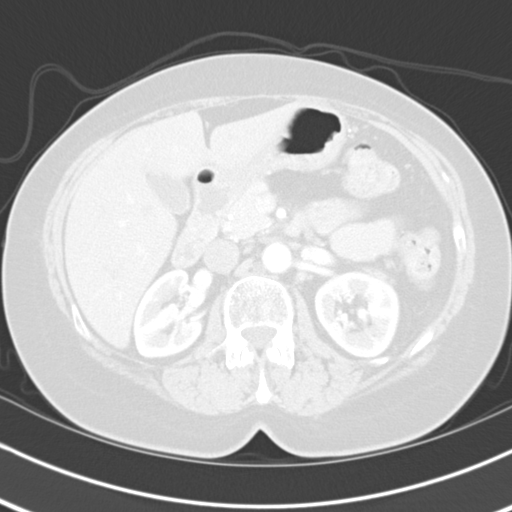
[im 10/67  lung]
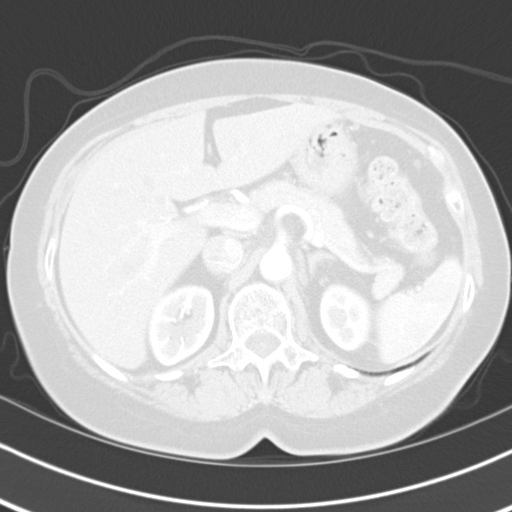
[im 15/67  lung]
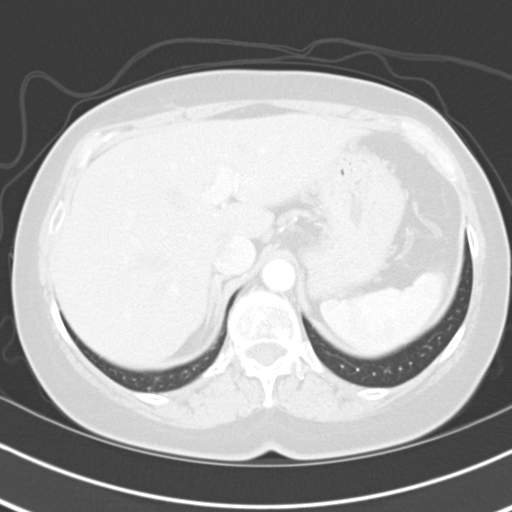
[im 20/67  lung]
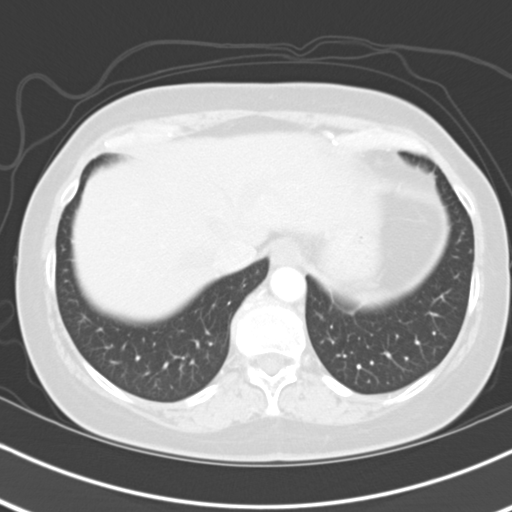
[im 25/67  mediastinal]
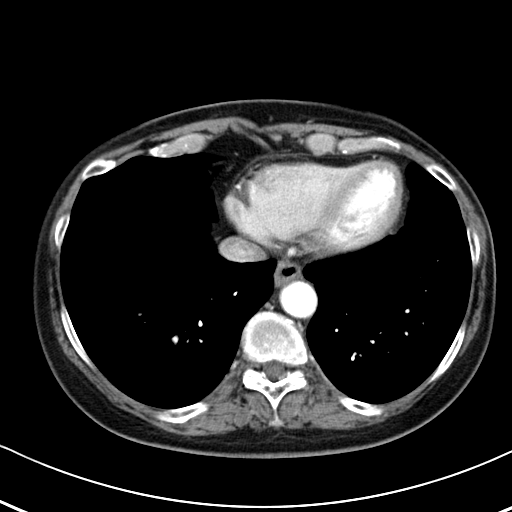
[im 25/67  lung]
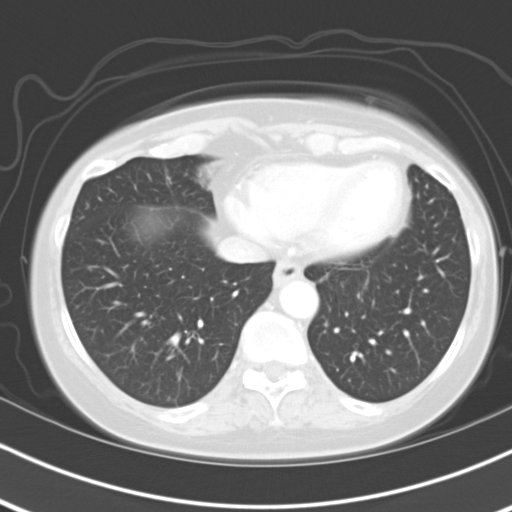
[im 30/67  lung]
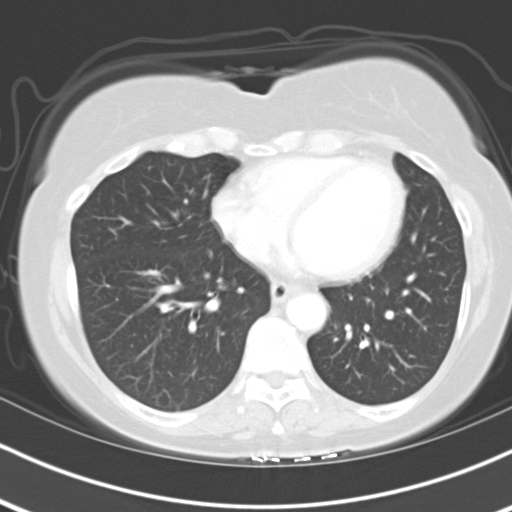
[im 35/67  lung]
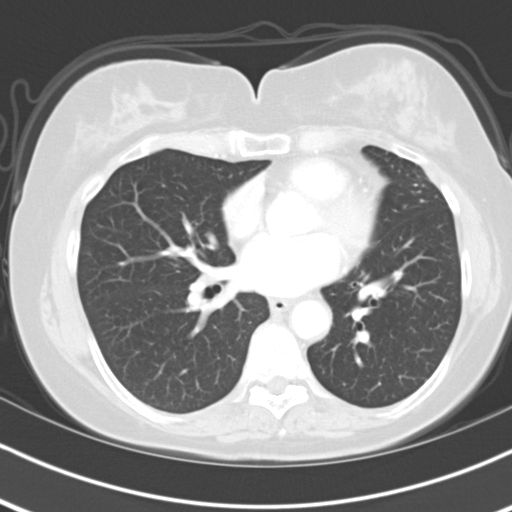
[im 40/67  lung]
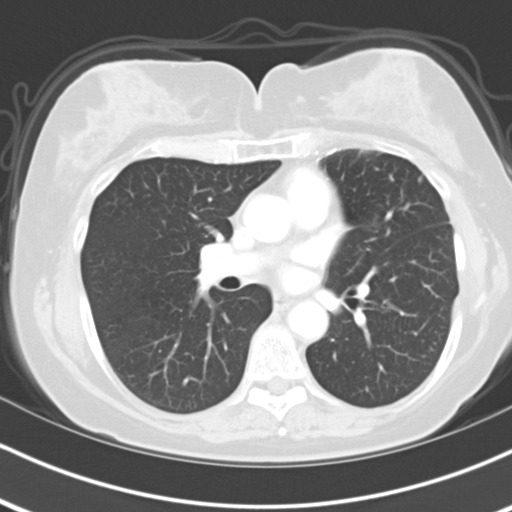
[im 45/67  mediastinal]
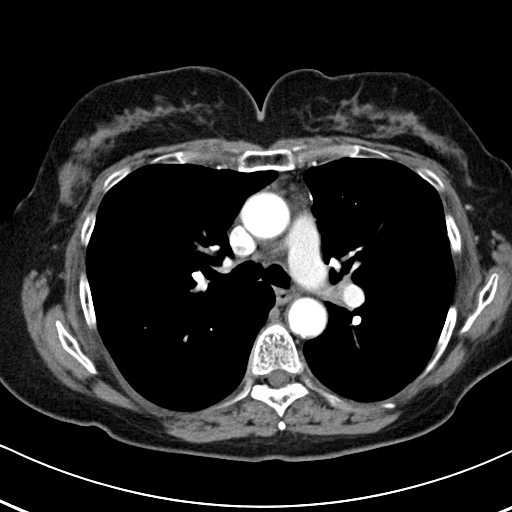
[im 45/67  lung]
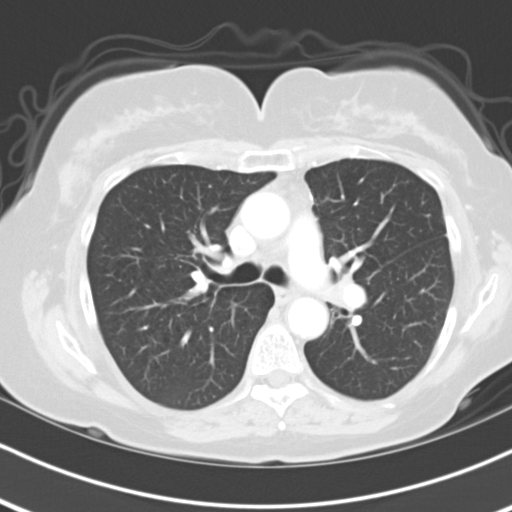
[im 49/67  lung]
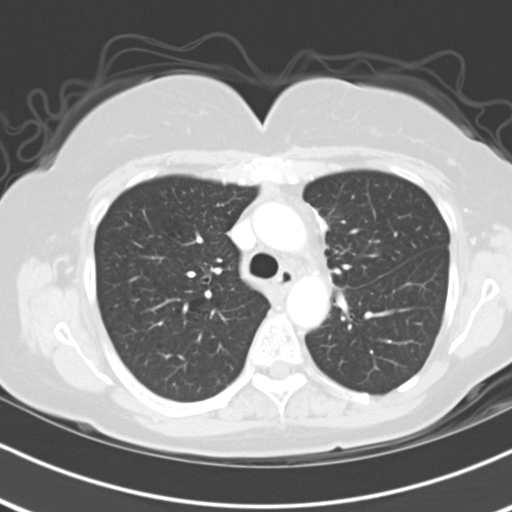
[im 54/67  lung]
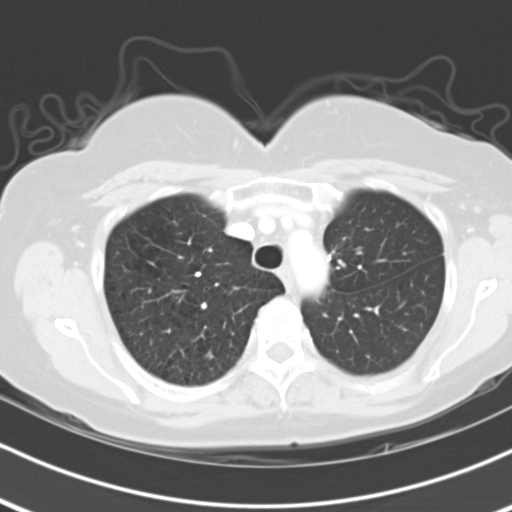
[im 59/67  lung]
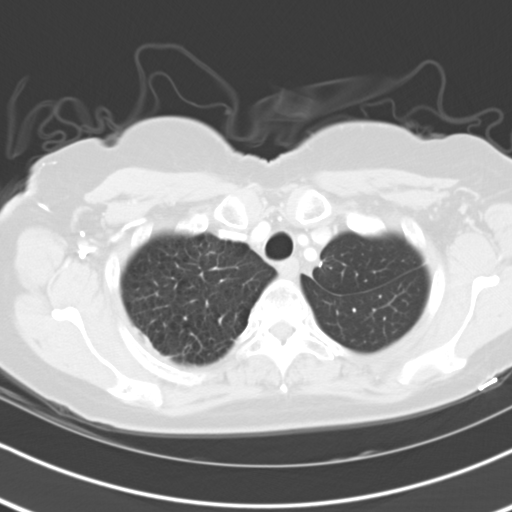
[im 64/67  mediastinal]
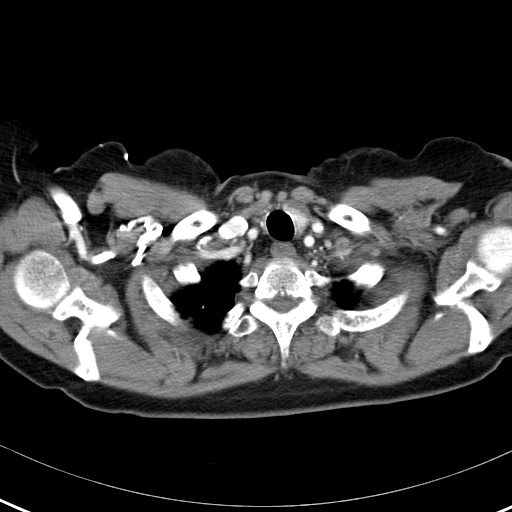
[im 64/67  lung]
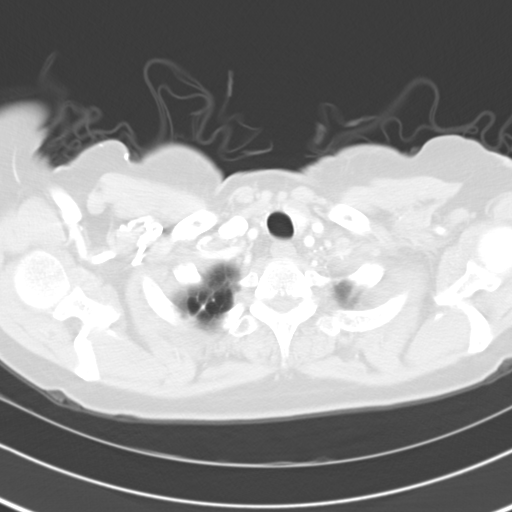

[Series 602: <mpr thick range> · coronal · 0.65mm/px · 3 of 68 slices shown]
[im 14/68  lung]
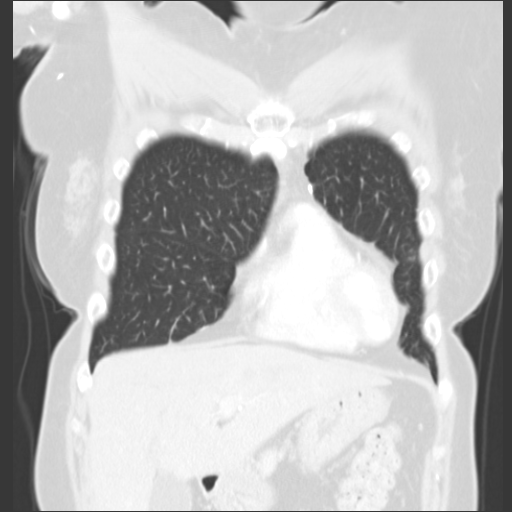
[im 27/68  lung]
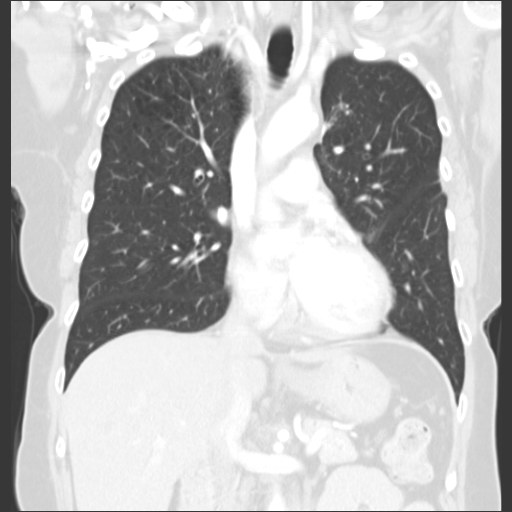
[im 41/68  lung]
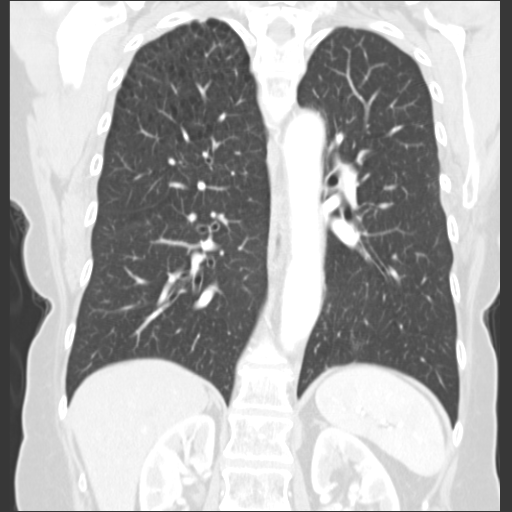

[16 of 36 positions shown; findings below may reference images not displayed]

FINDINGS: Postoperative changes noted in the left upper lobe. Minimal scarring
noted medially in the left upper lung. No mediastinal, hilar, or axillary
adenopathy.

Small scattered nodules in the right lung as previously mentioned are stable. No
new or enlarging or suspicious nodules. No effusions. Heart is normal size.
Aorta normal caliber.

Imaging into the upper abdomen shows no acute finding. Adrenals normal.

Visualized chest wall laterally unremarkable. No focal suspicious bony
abnormality.

IMPRESSION

No acute findings. Stable chest.

## 2007-11-21 ENCOUNTER — Ambulatory Visit: Payer: Self-pay | Admitting: Internal Medicine

## 2007-11-26 LAB — CBC WITH DIFFERENTIAL/PLATELET
BASO%: 0.5 % (ref 0.0–2.0)
Basophils Absolute: 0 10*3/uL (ref 0.0–0.1)
EOS%: 7.4 % — ABNORMAL HIGH (ref 0.0–7.0)
HCT: 38.7 % (ref 34.8–46.6)
HGB: 13.3 g/dL (ref 11.6–15.9)
MCH: 31.2 pg (ref 26.0–34.0)
MCHC: 34.2 g/dL (ref 32.0–36.0)
MCV: 91.2 fL (ref 81.0–101.0)
MONO%: 8.3 % (ref 0.0–13.0)
NEUT%: 50.7 % (ref 39.6–76.8)
RDW: 14 % (ref 11.3–14.5)
lymph#: 1.8 10*3/uL (ref 0.9–3.3)

## 2007-11-26 LAB — COMPREHENSIVE METABOLIC PANEL
ALT: 18 U/L (ref 0–35)
AST: 16 U/L (ref 0–37)
Alkaline Phosphatase: 78 U/L (ref 39–117)
Calcium: 9.1 mg/dL (ref 8.4–10.5)
Chloride: 105 mEq/L (ref 96–112)
Creatinine, Ser: 0.78 mg/dL (ref 0.40–1.20)

## 2007-11-27 ENCOUNTER — Ambulatory Visit (HOSPITAL_COMMUNITY): Admission: RE | Admit: 2007-11-27 | Discharge: 2007-11-27 | Payer: Self-pay | Admitting: Internal Medicine

## 2007-11-27 IMAGING — CT CT CHEST W/ CM
2 of 3 series · 15 of 36 positions shown, 18 images · IV contrast (agent unspecified)
Comparison: [DATE]

CLINICAL DATA: Lung cancer

CT CHEST WITH CONTRAST
TECHNIQUE: Multidetector CT imaging of the chest was performed
following the standard protocol during bolus administration of
intravenous contrast.
Contrast: 80 ml of [BT]

[Series 2: chest routine 5.0 b40f · axial · 0.69mm/px · z∈[-444,-159]mm · 12 of 67 slices shown, 15 images]
[im 5/67  mediastinal]
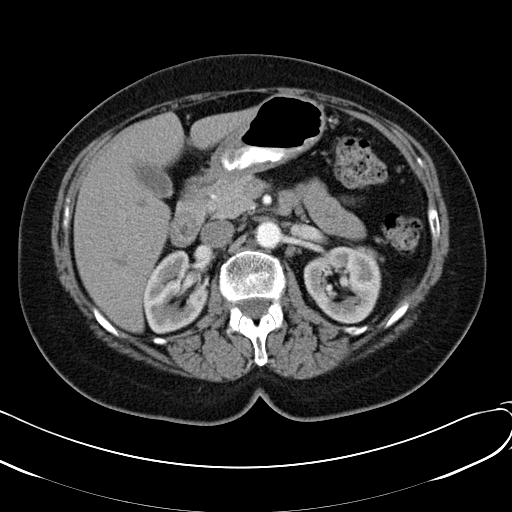
[im 5/67  lung]
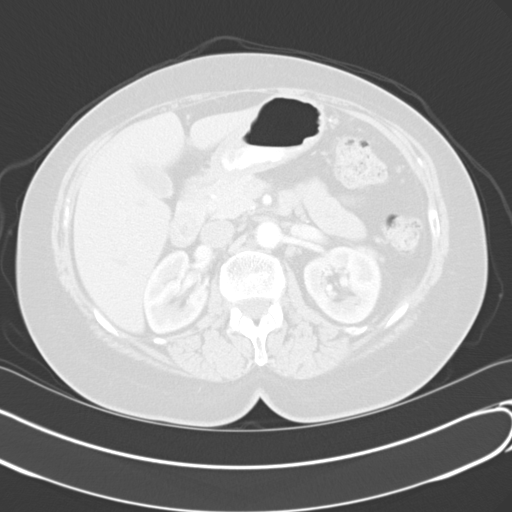
[im 10/67  lung]
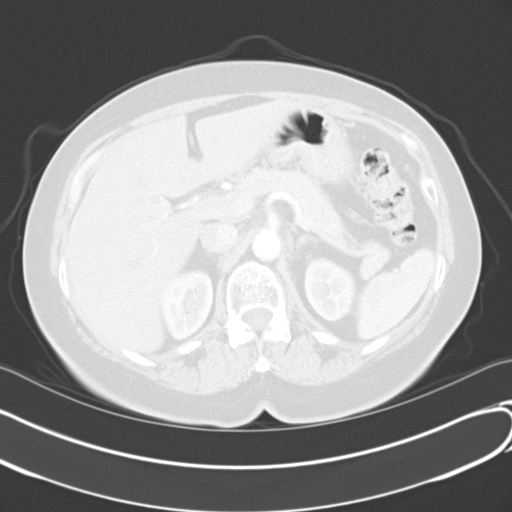
[im 15/67  lung]
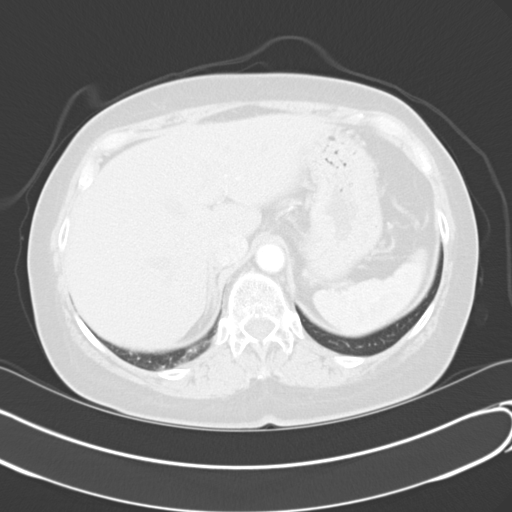
[im 20/67  lung]
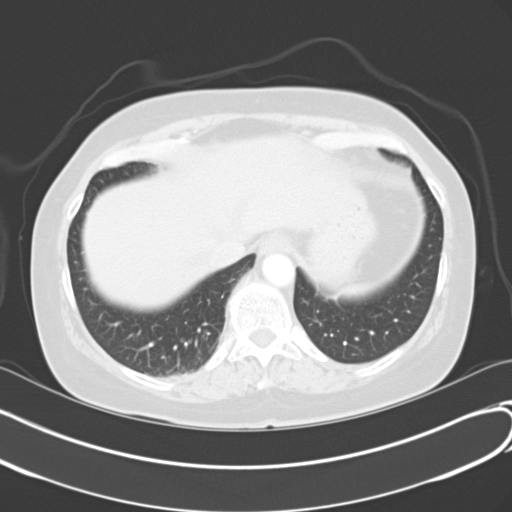
[im 25/67  mediastinal]
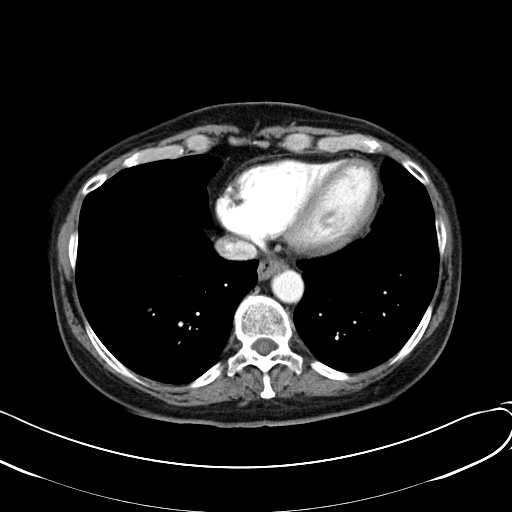
[im 25/67  lung]
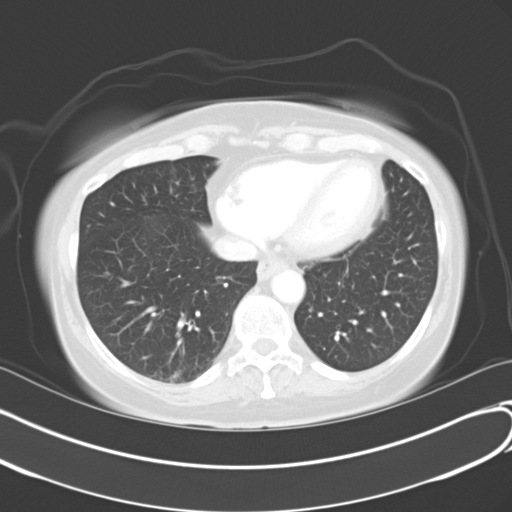
[im 30/67  lung]
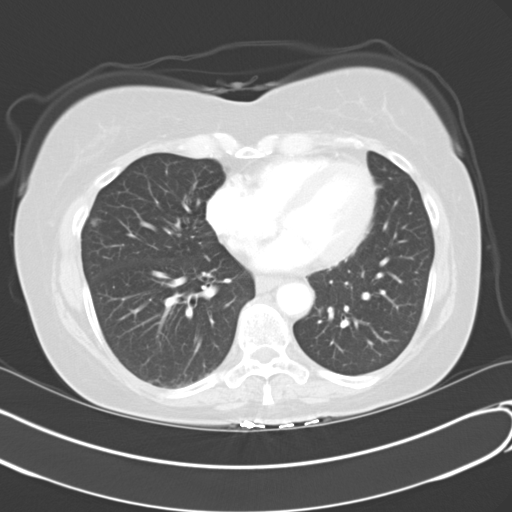
[im 37/67  lung]
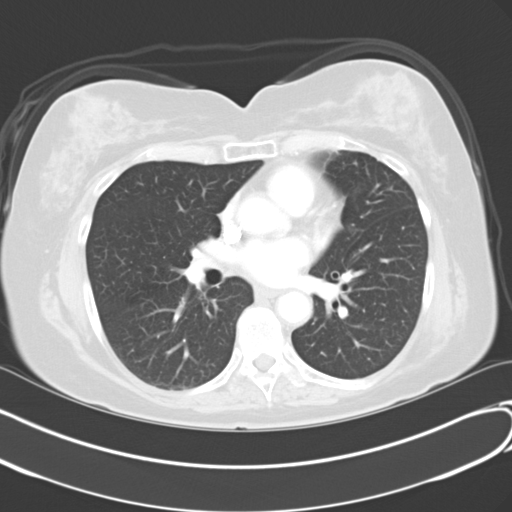
[im 42/67  lung]
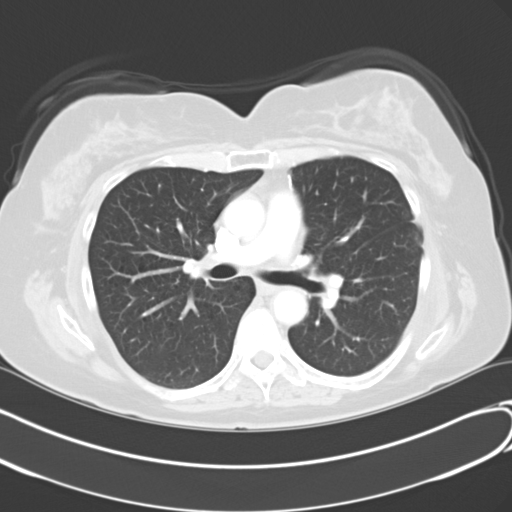
[im 47/67  mediastinal]
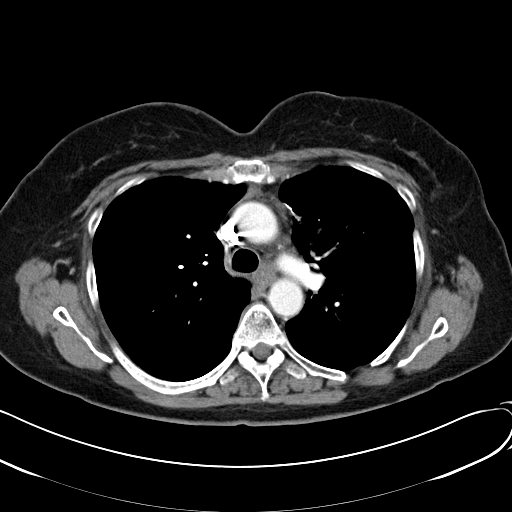
[im 47/67  lung]
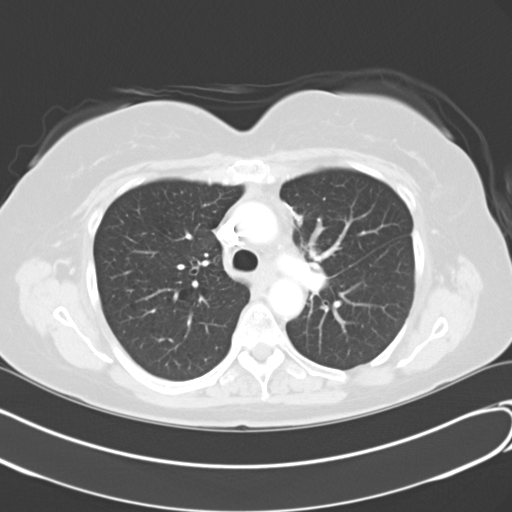
[im 52/67  lung]
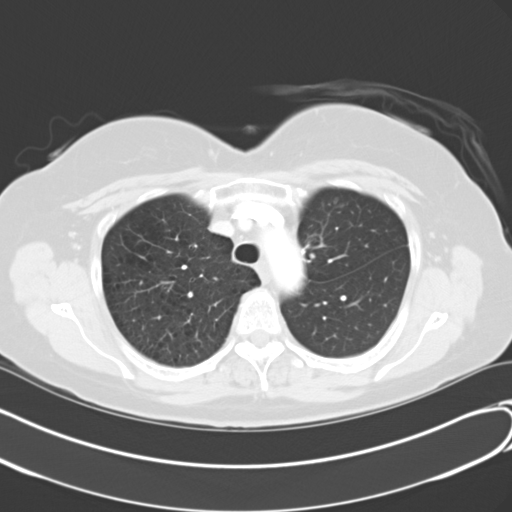
[im 57/67  lung]
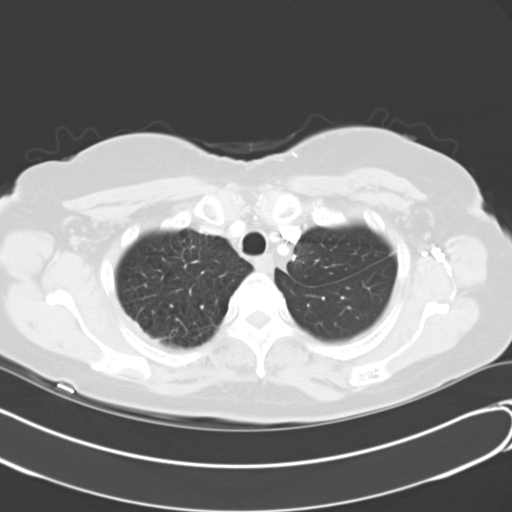
[im 62/67  lung]
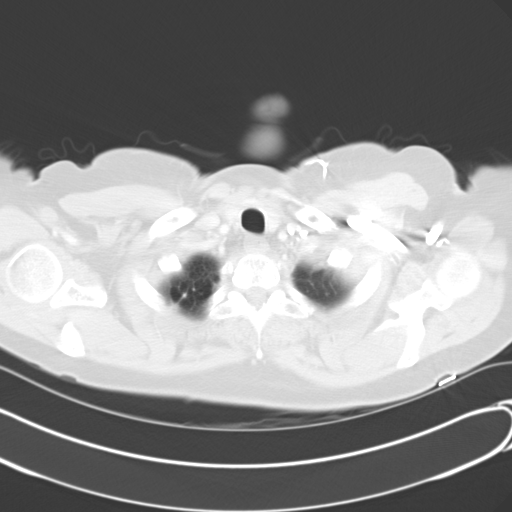

[Series 602: <mpr thick range> · coronal · 0.69mm/px · 3 of 73 slices shown]
[im 15/73  lung]
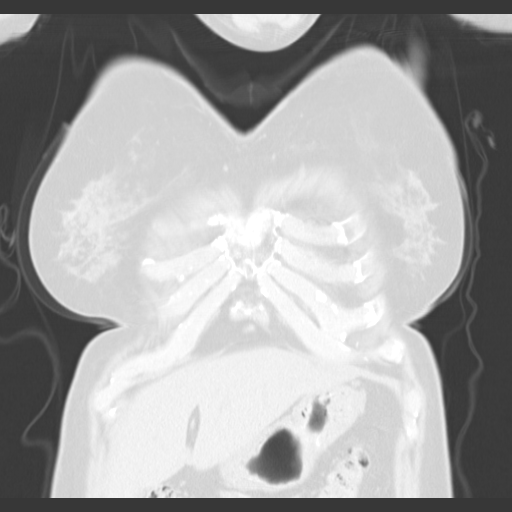
[im 29/73  lung]
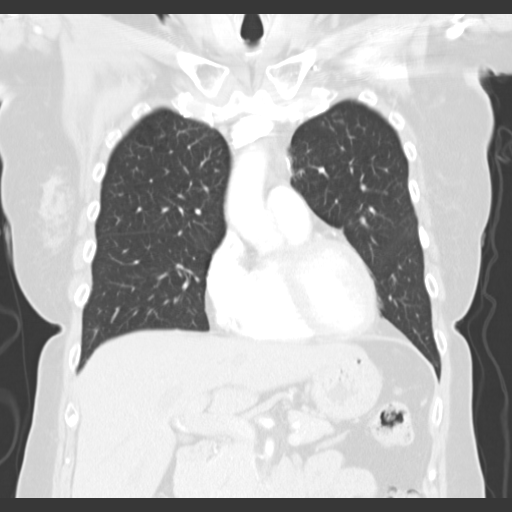
[im 44/73  lung]
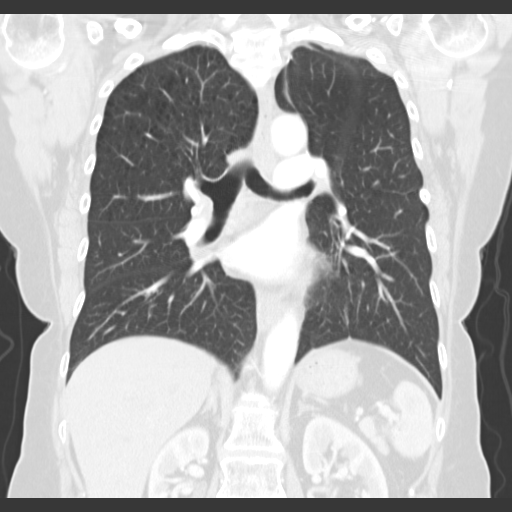

[15 of 36 positions shown; findings below may reference images not displayed]

FINDINGS: The chest wall is unremarkable.  No supraclavicular or
axillary adenopathy.  No obvious breast lesions.  The bony thorax
is intact.  No metastatic bone lesions are identified.  Stable
surgical changes involving the left posterior chest with rib
resection.

The heart is normal in size.  No pericardial effusion.  No enlarged
mediastinal or hilar lymph nodes.  The aorta is normal in caliber.
Mild atherosclerotic changes.  Coronary artery calcifications are
also noted.

Examination of the lung parenchyma demonstrates stable changes of
COPD.  No change in right upper lobe scarring changes.  The right
upper lobe pulmonary nodule on image number 15 has increased
slightly in its transverse dimension measuring 3.7 mm.  This
previously measured 2.8 mm.  There is a stable lesion in the
peripheral aspect of the right middle lobe on image number 35.
Slightly more inferior in the right middle lobe there is an
enlarging nodule on image number 39.  This measures 8.3 mm and it
previously measured 6.2 mm.  I do not see any new pulmonary
lesions.  Surgical changes in the left upper lobe are noted.
Stable scarring changes of the right lung base.

The visualized upper abdomen demonstrates a few small stable
retroperitoneal lymph nodes on the left side.  The adrenal glands
are unremarkable and stable.
IMPRESSION: 1.  Slight increase in size of two right lung nodules as discussed
above.  No new lung lesions are seen.
2.  Stable changes of COPD and stable surgical scarring changes in
the left lung.
3.  No mediastinal or hilar adenopathy.

## 2008-05-21 ENCOUNTER — Ambulatory Visit: Payer: Self-pay | Admitting: Internal Medicine

## 2008-05-25 LAB — COMPREHENSIVE METABOLIC PANEL
ALT: 23 U/L (ref 0–35)
AST: 21 U/L (ref 0–37)
Albumin: 3.9 g/dL (ref 3.5–5.2)
Alkaline Phosphatase: 80 U/L (ref 39–117)
Calcium: 9.4 mg/dL (ref 8.4–10.5)
Chloride: 102 mEq/L (ref 96–112)
Potassium: 3.5 mEq/L (ref 3.5–5.3)

## 2008-05-25 LAB — CBC WITH DIFFERENTIAL/PLATELET
BASO%: 0.6 % (ref 0.0–2.0)
EOS%: 7.2 % — ABNORMAL HIGH (ref 0.0–7.0)
MCH: 31.2 pg (ref 25.1–34.0)
MCHC: 33.9 g/dL (ref 31.5–36.0)
MCV: 91.9 fL (ref 79.5–101.0)
MONO%: 8 % (ref 0.0–14.0)
RBC: 4.34 10*6/uL (ref 3.70–5.45)
RDW: 13.4 % (ref 11.2–14.5)
lymph#: 2.4 10*3/uL (ref 0.9–3.3)

## 2008-05-26 ENCOUNTER — Ambulatory Visit (HOSPITAL_COMMUNITY): Admission: RE | Admit: 2008-05-26 | Discharge: 2008-05-26 | Payer: Self-pay | Admitting: Internal Medicine

## 2008-05-26 IMAGING — CT CT CHEST W/ CM
2 of 3 series · 15 of 36 positions shown, 18 images · IV contrast (agent unspecified)
Comparison: [DATE], [DATE]

CLINICAL DATA: Lung cancer.

CT CHEST WITH CONTRAST
TECHNIQUE: Multidetector CT imaging of the chest was performed
following the standard protocol during bolus administration of
intravenous contrast.
Contrast: 80 ml [DQ]

[Series 2: chest with st · axial · 0.62mm/px · z∈[-322,-57]mm · 12 of 63 slices shown, 15 images]
[im 5/63  mediastinal]
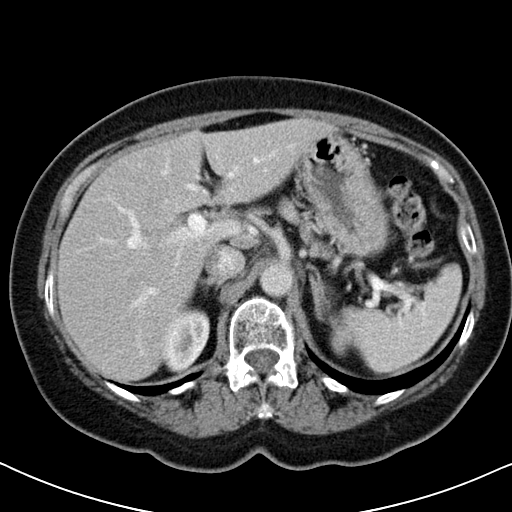
[im 5/63  lung]
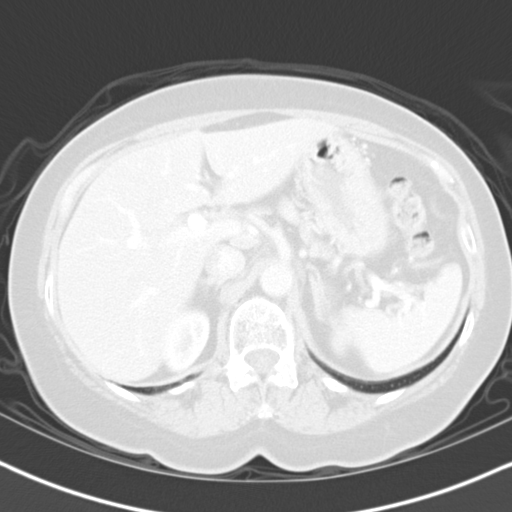
[im 10/63  lung]
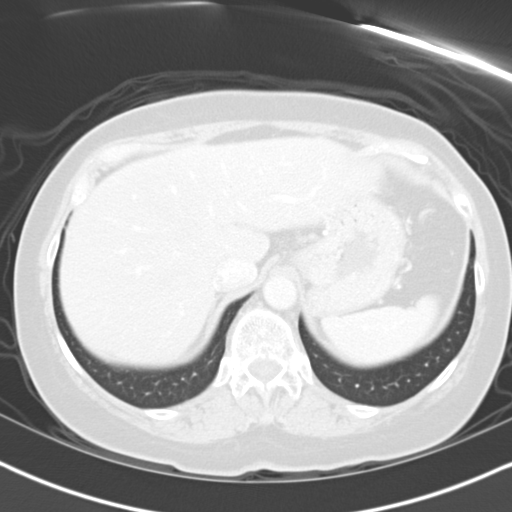
[im 14/63  lung]
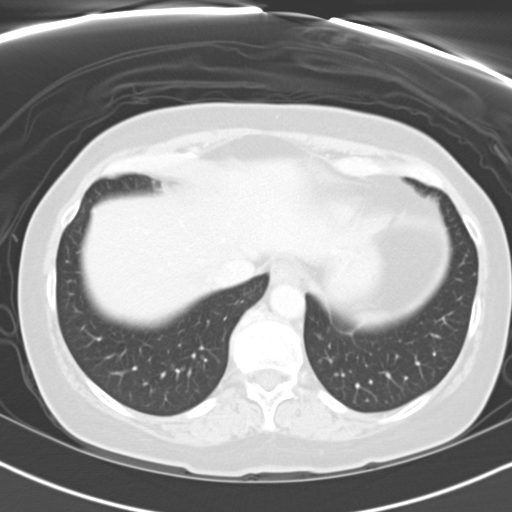
[im 19/63  lung]
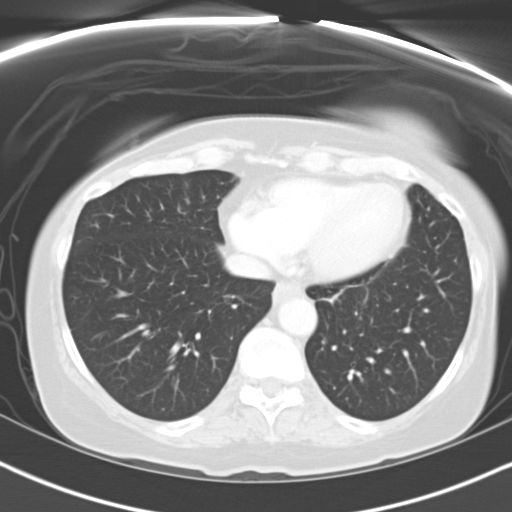
[im 23/63  mediastinal]
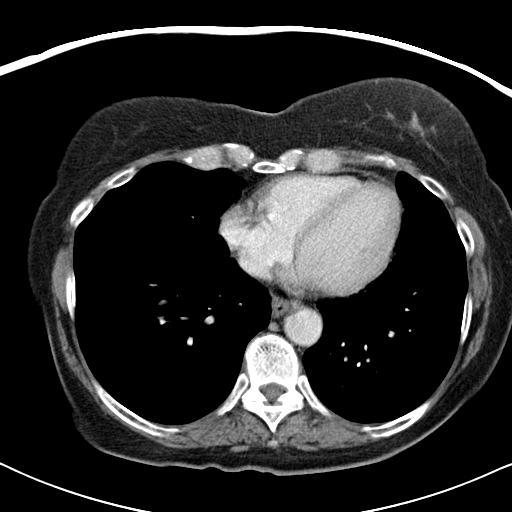
[im 23/63  lung]
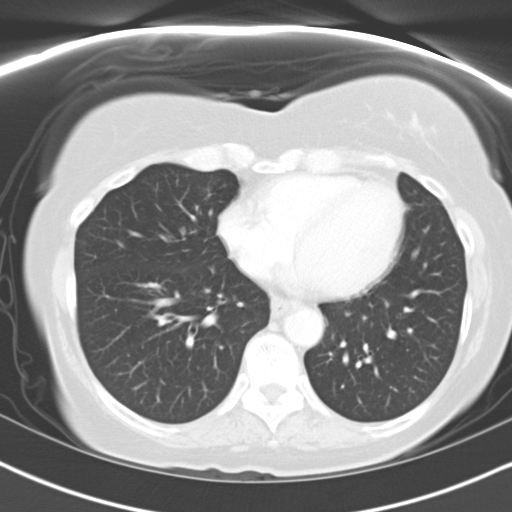
[im 28/63  lung]
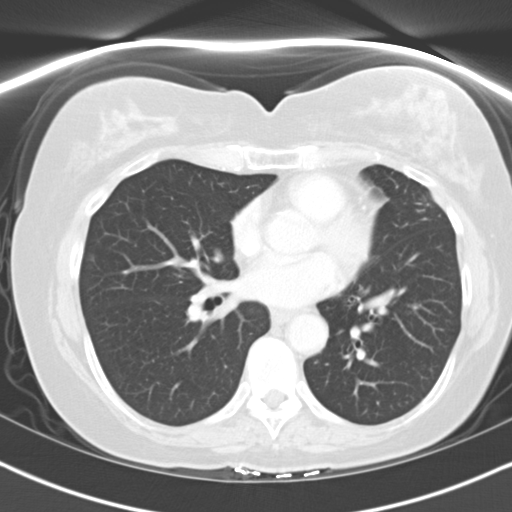
[im 35/63  lung]
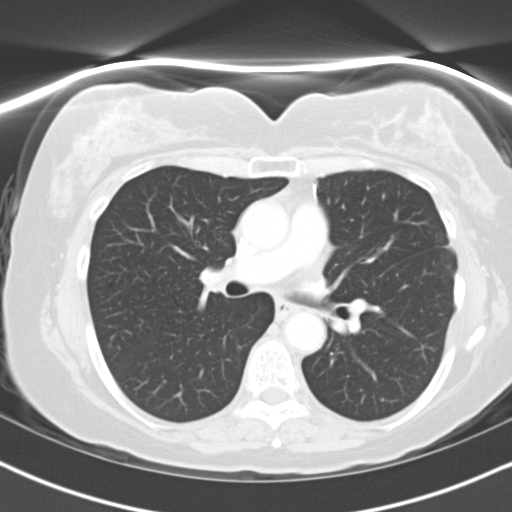
[im 40/63  lung]
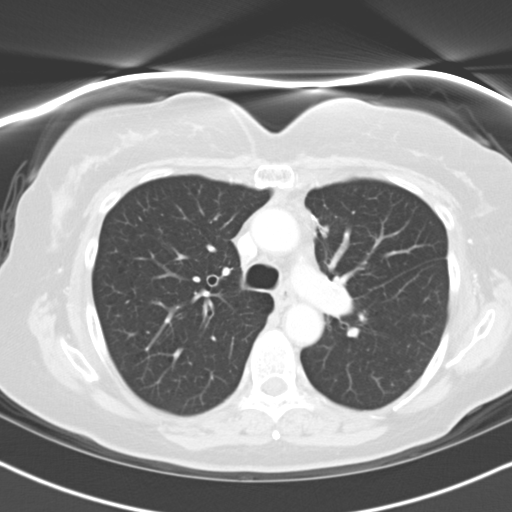
[im 44/63  mediastinal]
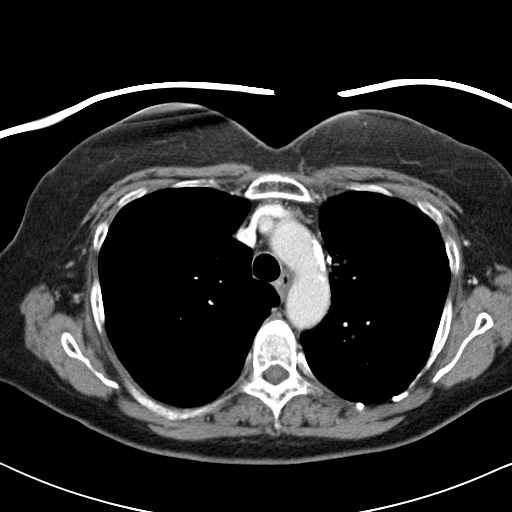
[im 44/63  lung]
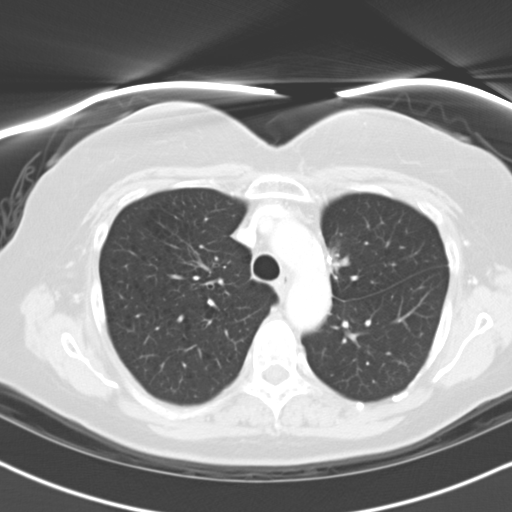
[im 49/63  lung]
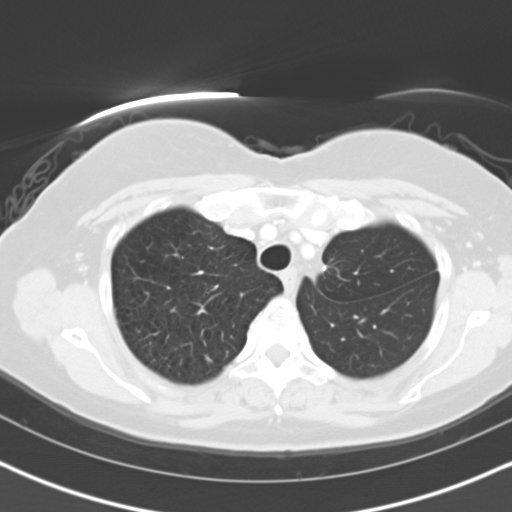
[im 53/63  lung]
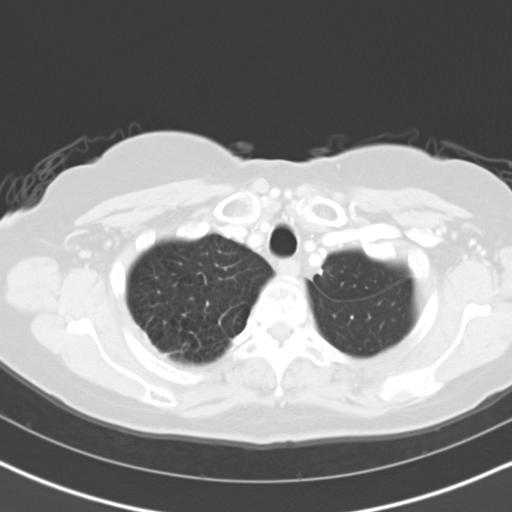
[im 58/63  lung]
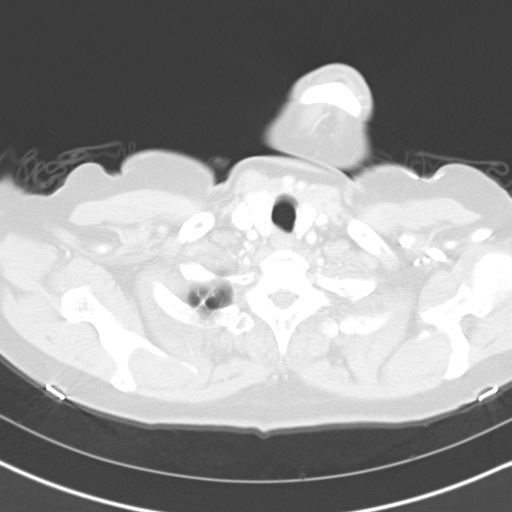

[Series 602: coronal images · coronal · 0.62mm/px · 3 of 74 slices shown]
[im 15/74  lung]
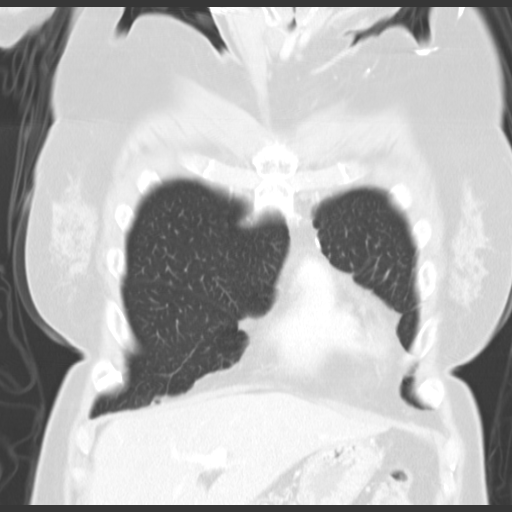
[im 30/74  lung]
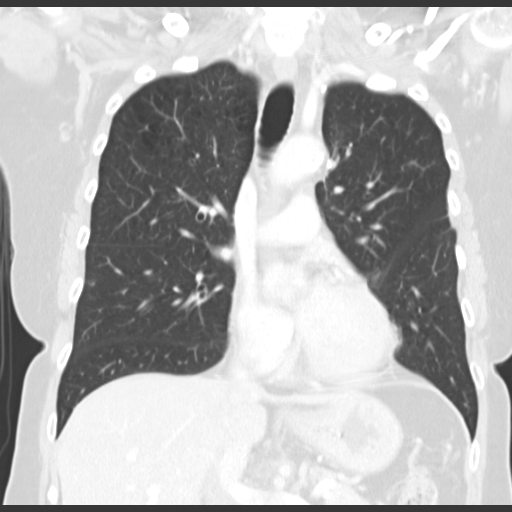
[im 44/74  lung]
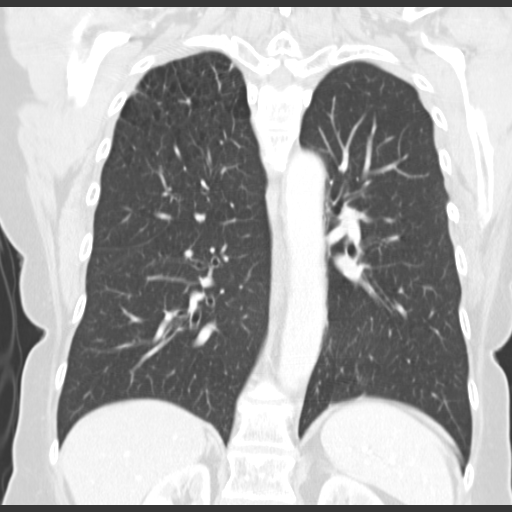

[15 of 36 positions shown; findings below may reference images not displayed]

FINDINGS: Prevascular soft tissue is unchanged and may represent
thymus.  No pathologically enlarged mediastinal, hilar or axillary
lymph nodes.  Heart size normal.  No pericardial effusion.
Atherosclerotic calcification of the arterial vasculature.

Postoperative changes are seen in the left hemithorax.  Lungs are
emphysematous, with subpleural scarring in the apical segment right
upper lobe.  Nodular appearing scar in the right upper lobe (image
16), and subpleural nodules in the right middle lobe, appear
taken into consideration. No new nodules.  No pleural fluid.
Airway is otherwise unremarkable.

Incidental imaging of the upper abdomen shows no acute findings.
There are small upper abdominal lymph nodes.  No worrisome lytic or
sclerotic lesions.
IMPRESSION: 1.  No evidence of recurrent or metastatic disease.
2.  Right upper and right middle lobe nodules appear very similar
in appearance to baseline exam of [DATE], and are likely
stable.

## 2008-11-23 ENCOUNTER — Ambulatory Visit: Payer: Self-pay | Admitting: Internal Medicine

## 2008-11-25 LAB — COMPREHENSIVE METABOLIC PANEL
ALT: 17 U/L (ref 0–35)
AST: 15 U/L (ref 0–37)
Albumin: 3.9 g/dL (ref 3.5–5.2)
BUN: 21 mg/dL (ref 6–23)
Calcium: 9.3 mg/dL (ref 8.4–10.5)
Chloride: 106 mEq/L (ref 96–112)
Potassium: 4.2 mEq/L (ref 3.5–5.3)
Sodium: 141 mEq/L (ref 135–145)
Total Protein: 6.5 g/dL (ref 6.0–8.3)

## 2008-11-25 LAB — CBC WITH DIFFERENTIAL/PLATELET
Basophils Absolute: 0 10*3/uL (ref 0.0–0.1)
EOS%: 8.3 % — ABNORMAL HIGH (ref 0.0–7.0)
HGB: 13.5 g/dL (ref 11.6–15.9)
MCH: 31.4 pg (ref 25.1–34.0)
NEUT#: 2.1 10*3/uL (ref 1.5–6.5)
RBC: 4.3 10*6/uL (ref 3.70–5.45)
RDW: 13.8 % (ref 11.2–14.5)
lymph#: 1.7 10*3/uL (ref 0.9–3.3)

## 2008-11-30 ENCOUNTER — Ambulatory Visit (HOSPITAL_COMMUNITY): Admission: RE | Admit: 2008-11-30 | Discharge: 2008-11-30 | Payer: Self-pay | Admitting: Internal Medicine

## 2008-12-09 ENCOUNTER — Encounter: Admission: RE | Admit: 2008-12-09 | Discharge: 2008-12-09 | Payer: Self-pay | Admitting: Internal Medicine

## 2008-12-09 IMAGING — US US BREAST R
1 series · 3 of 3 positions shown · non-contrast
Comparison: [DATE] and [DATE]

CLINICAL DATA: Palpable thickening in the upper outer right breast
discovered on self examination.

DIGITAL DIAGNOSTIC  BILATERAL  MAMMOGRAM  WITH CAD AND RIGHT BREAST
ULTRASOUND:

[Series 1: us breast right · 3 of 3 slices shown]
[im 1/3]
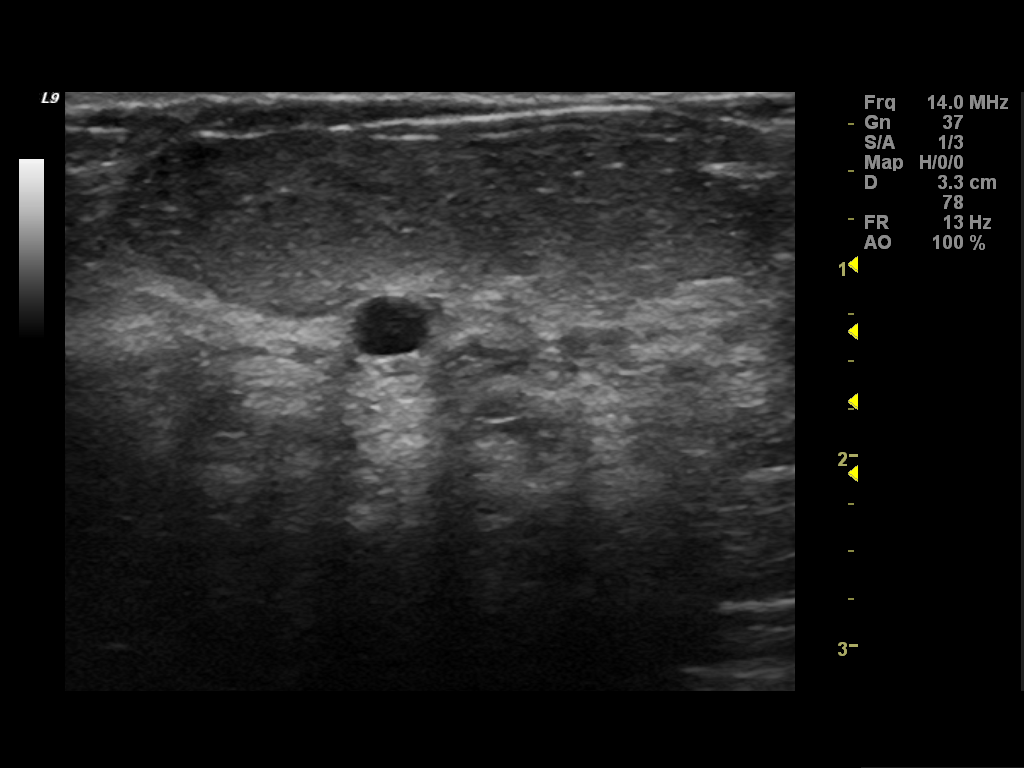
[im 2/3]
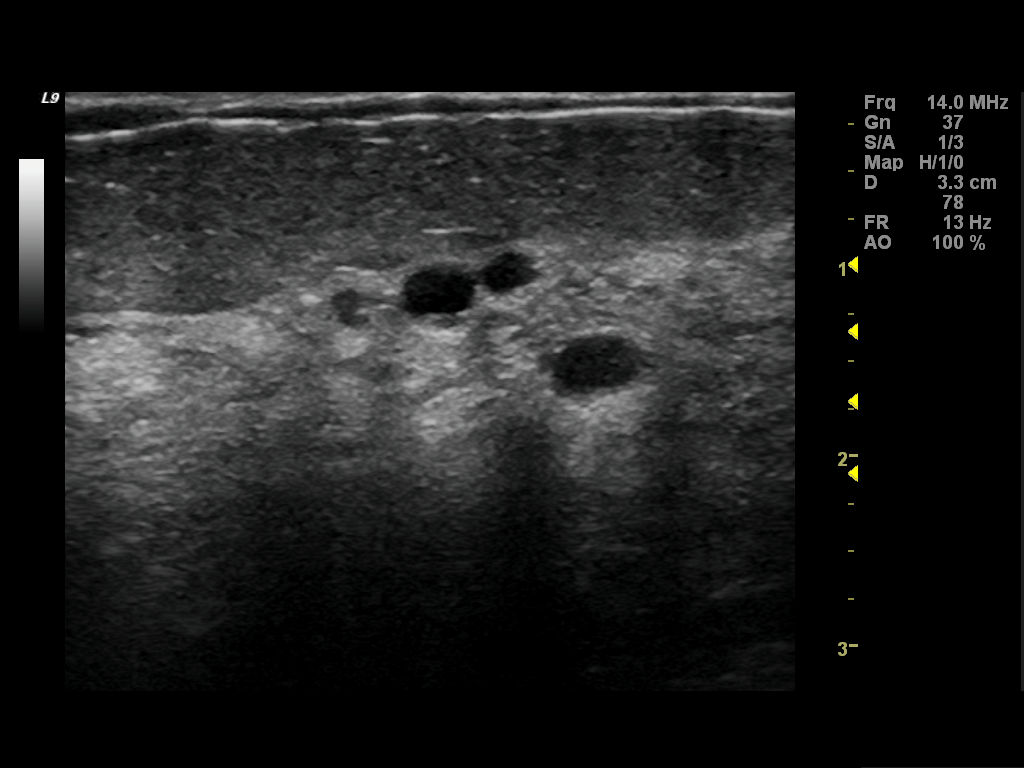
[im 3/3]
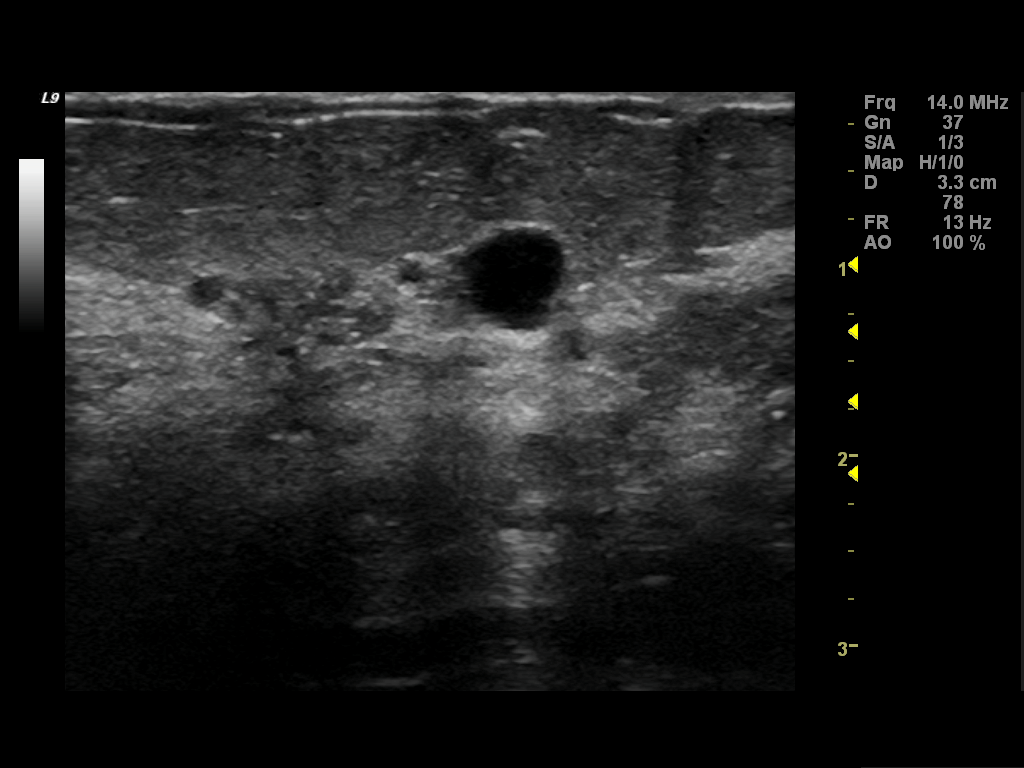

[3 of 3 positions shown; findings below may reference images not displayed]

FINDINGS: A spot tangential view of the right breast and CC and MLO
views of both breasts again demonstrate heterogeneously dense
breast tissue bilaterally.
Multiple calcifications throughout the right breast are noted which
are smudgy on the CC view and layer on the MLO view compatible with
milk of calcium.
There is no evidence of discrete mass, distortion, or suspicious
calcifications within either breast.
Mammographic images were processed with CAD.

On physical exam, mild thickening is palpated within the upper
outer right breast without discrete nodule or mass.

Ultrasound is performed, showing multiple small simple cysts
throughout the upper outer right breast.  There is no evidence of
solid mass, distortion, or worrisome shadowing in the upper outer
right breast in the area of palpable thickening.
IMPRESSION: Small simple cysts throughout the upper outer right breast, with
corresponding milk of calcium mammographically.

No specific evidence of malignancy.

These findings were discussed with the patient.  She was encouraged
to continue monthly self exams and to contact her primary physician
if any changes noted.

BI-RADS CATEGORY 2:  Benign finding(s).

Recommend bilateral screening mammograms in 1 year.

## 2008-12-09 IMAGING — MG MM DIAGNOSTIC BILATERAL
5 series · 5 of 5 positions shown · non-contrast
Comparison: [DATE] and [DATE]

CLINICAL DATA: Palpable thickening in the upper outer right breast
discovered on self examination.

DIGITAL DIAGNOSTIC  BILATERAL  MAMMOGRAM  WITH CAD AND RIGHT BREAST
ULTRASOUND:

[R CC]
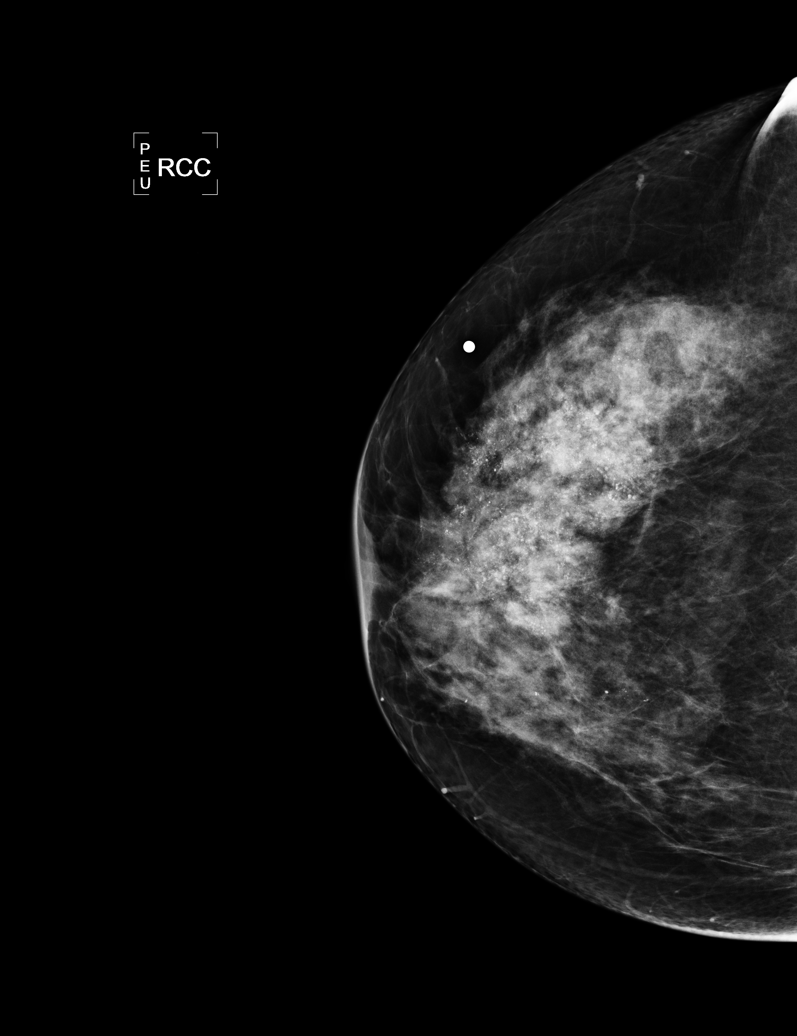

[L CC]
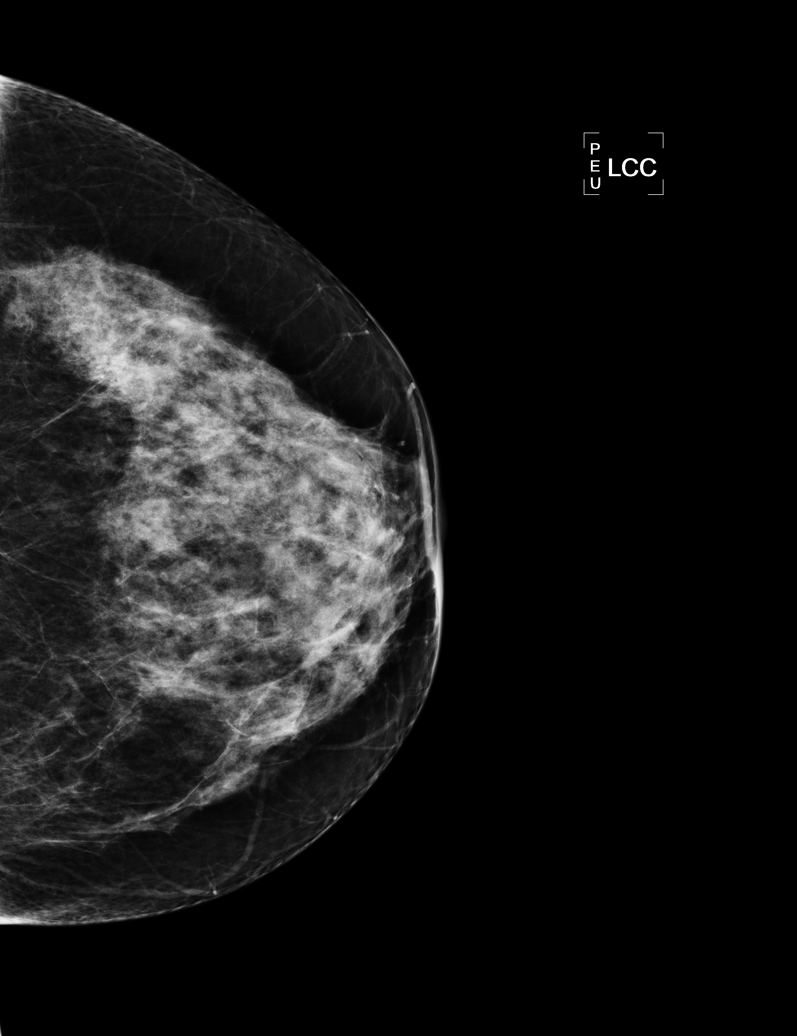

[L MLO]
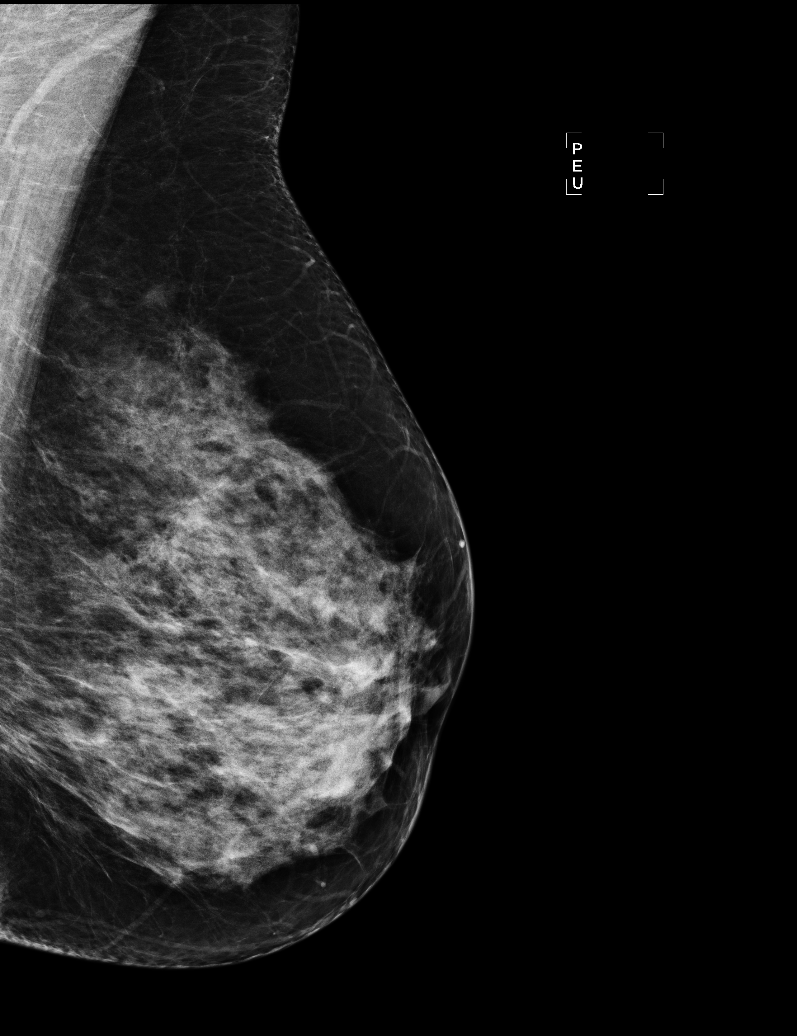

[R MLO]
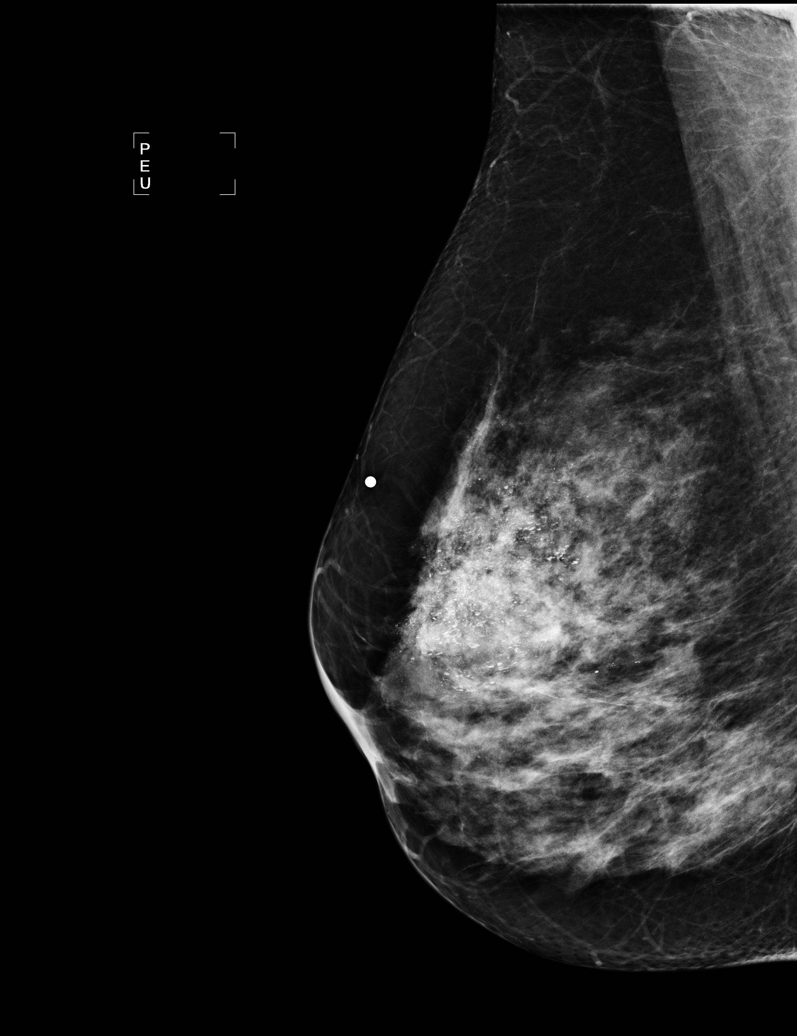

[R TAN]
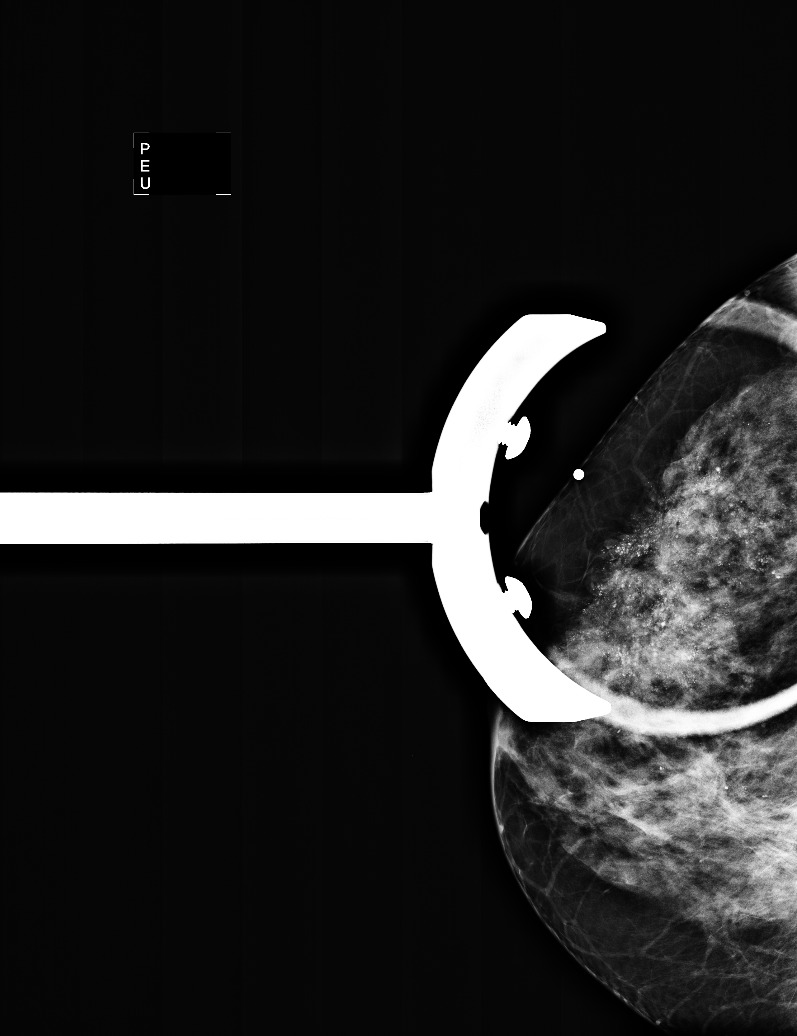

[5 of 5 positions shown; findings below may reference images not displayed]

FINDINGS: A spot tangential view of the right breast and CC and MLO
views of both breasts again demonstrate heterogeneously dense
breast tissue bilaterally.
Multiple calcifications throughout the right breast are noted which
are smudgy on the CC view and layer on the MLO view compatible with
milk of calcium.
There is no evidence of discrete mass, distortion, or suspicious
calcifications within either breast.
Mammographic images were processed with CAD.

On physical exam, mild thickening is palpated within the upper
outer right breast without discrete nodule or mass.

Ultrasound is performed, showing multiple small simple cysts
throughout the upper outer right breast.  There is no evidence of
solid mass, distortion, or worrisome shadowing in the upper outer
right breast in the area of palpable thickening.
IMPRESSION: Small simple cysts throughout the upper outer right breast, with
corresponding milk of calcium mammographically.

No specific evidence of malignancy.

These findings were discussed with the patient.  She was encouraged
to continue monthly self exams and to contact her primary physician
if any changes noted.

BI-RADS CATEGORY 2:  Benign finding(s).

Recommend bilateral screening mammograms in 1 year.

## 2009-05-26 ENCOUNTER — Ambulatory Visit: Payer: Self-pay | Admitting: Internal Medicine

## 2009-05-30 LAB — COMPREHENSIVE METABOLIC PANEL
ALT: 20 U/L (ref 0–35)
AST: 16 U/L (ref 0–37)
Albumin: 4 g/dL (ref 3.5–5.2)
BUN: 21 mg/dL (ref 6–23)
CO2: 27 mEq/L (ref 19–32)
Chloride: 104 mEq/L (ref 96–112)
Potassium: 4.1 mEq/L (ref 3.5–5.3)
Total Bilirubin: 0.4 mg/dL (ref 0.3–1.2)
Total Protein: 6.8 g/dL (ref 6.0–8.3)

## 2009-05-30 LAB — CBC WITH DIFFERENTIAL/PLATELET
BASO%: 0.5 % (ref 0.0–2.0)
EOS%: 7.7 % — ABNORMAL HIGH (ref 0.0–7.0)
Eosinophils Absolute: 0.4 10*3/uL (ref 0.0–0.5)
HGB: 13.6 g/dL (ref 11.6–15.9)
LYMPH%: 34.4 % (ref 14.0–49.7)
MCHC: 33.5 g/dL (ref 31.5–36.0)
MCV: 92 fL (ref 79.5–101.0)
NEUT#: 2.7 10*3/uL (ref 1.5–6.5)
NEUT%: 49.6 % (ref 38.4–76.8)
RBC: 4.4 10*6/uL (ref 3.70–5.45)
WBC: 5.5 10*3/uL (ref 3.9–10.3)

## 2009-06-01 ENCOUNTER — Ambulatory Visit (HOSPITAL_COMMUNITY): Admission: RE | Admit: 2009-06-01 | Discharge: 2009-06-01 | Payer: Self-pay | Admitting: Internal Medicine

## 2009-06-01 IMAGING — CT CT CHEST W/ CM
2 of 3 series · 15 of 36 positions shown, 18 images · IV contrast (omnipaque)
Comparison: CT thorax [DATE]

CLINICAL DATA: Lung cancer with left upper lobe resection.
Chemotherapy completed [70].

CT CHEST WITH CONTRAST
TECHNIQUE: Multidetector CT imaging of the chest was performed
following the standard protocol during bolus administration of
intravenous contrast.
Contrast: 80 ml Omnipaque 300

[Series 2: chest with st · axial · 0.72mm/px · z∈[-426,-166]mm · 12 of 62 slices shown, 15 images]
[im 5/62  mediastinal]
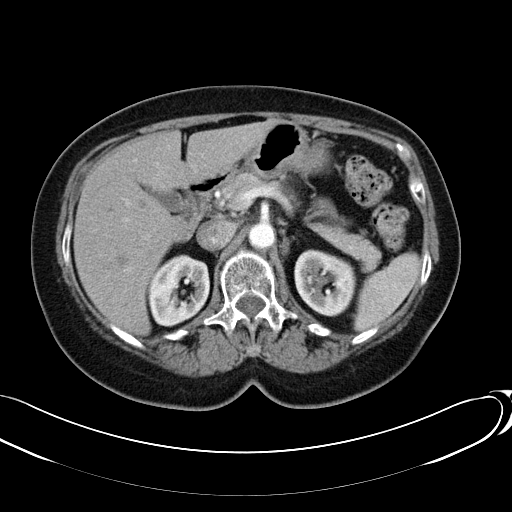
[im 5/62  lung]
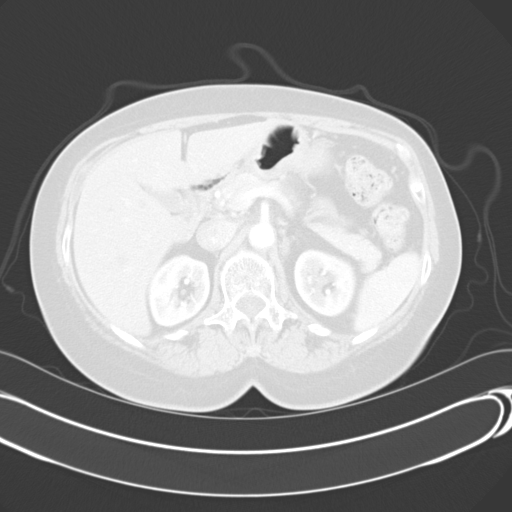
[im 10/62  lung]
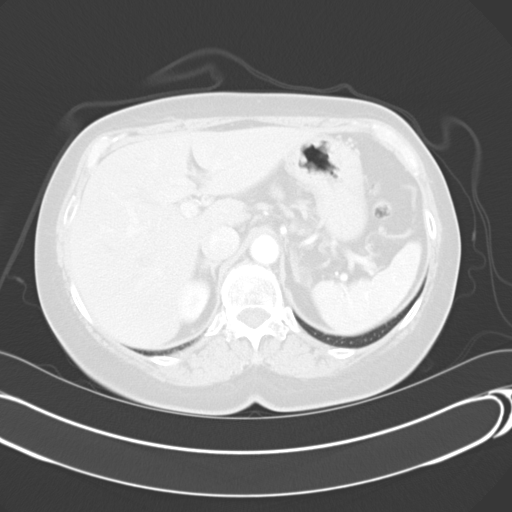
[im 14/62  lung]
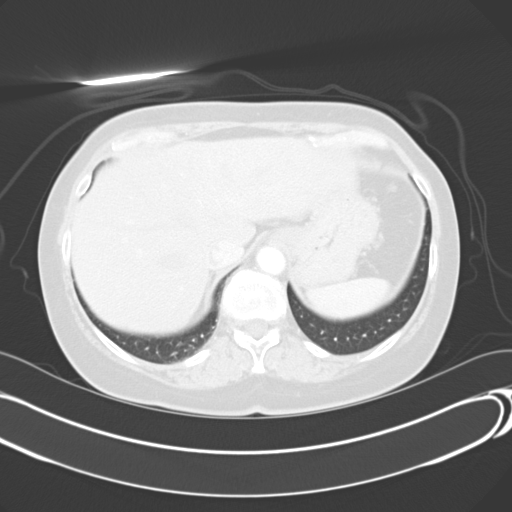
[im 19/62  lung]
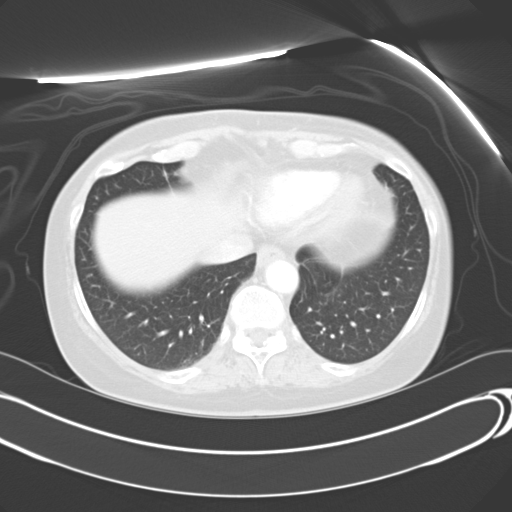
[im 23/62  mediastinal]
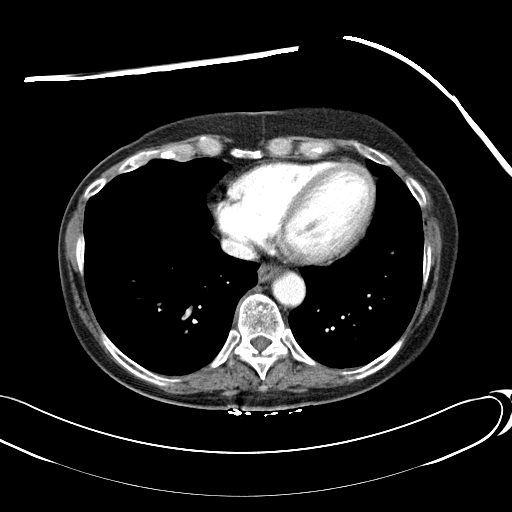
[im 23/62  lung]
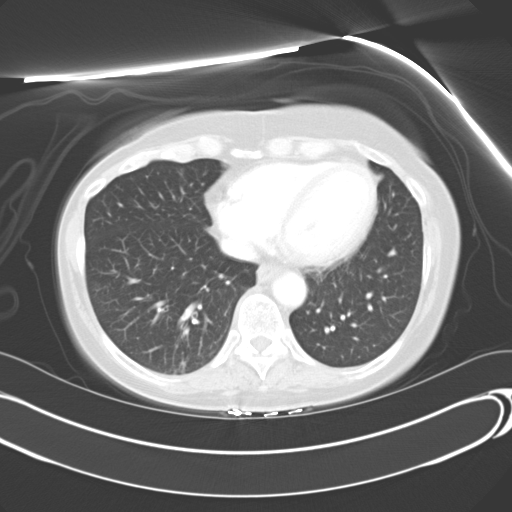
[im 28/62  lung]
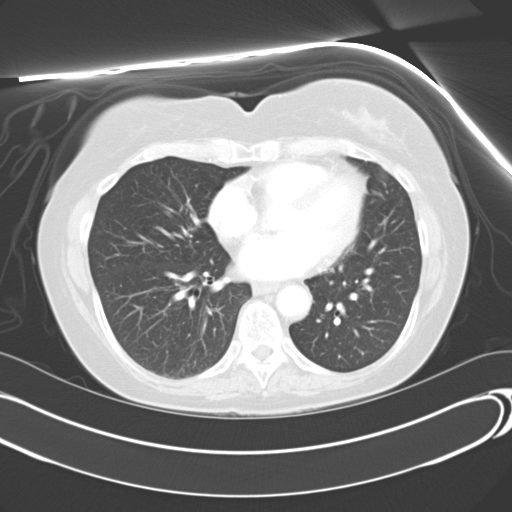
[im 34/62  lung]
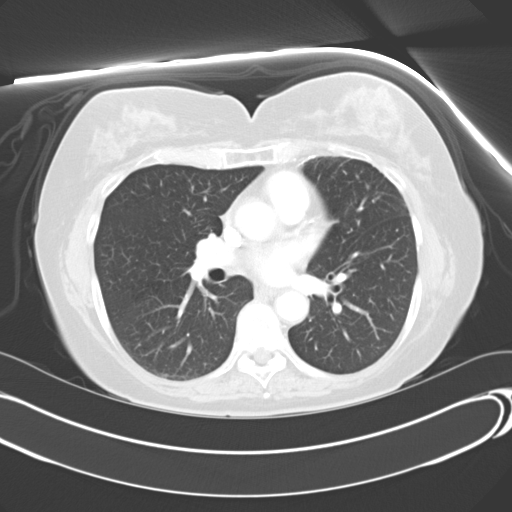
[im 39/62  lung]
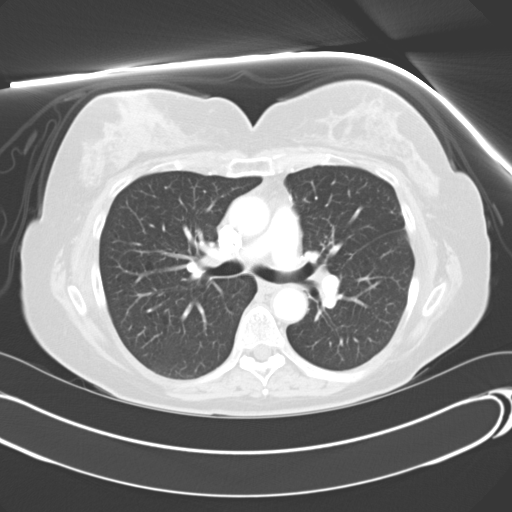
[im 43/62  mediastinal]
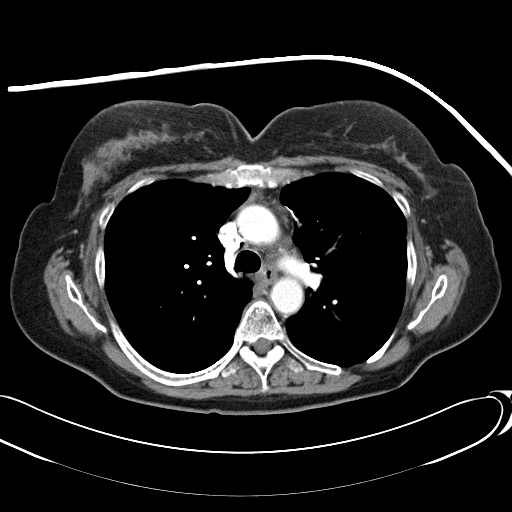
[im 43/62  lung]
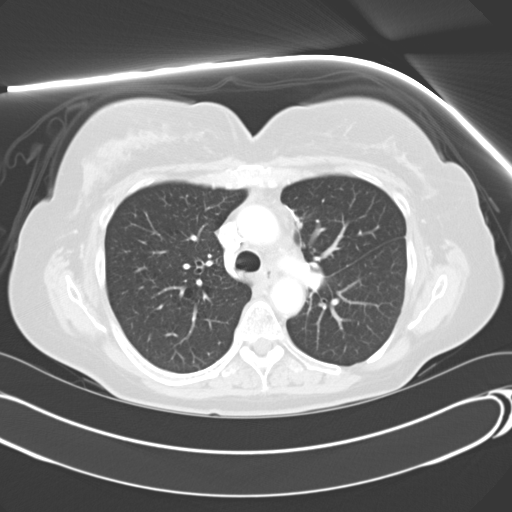
[im 48/62  lung]
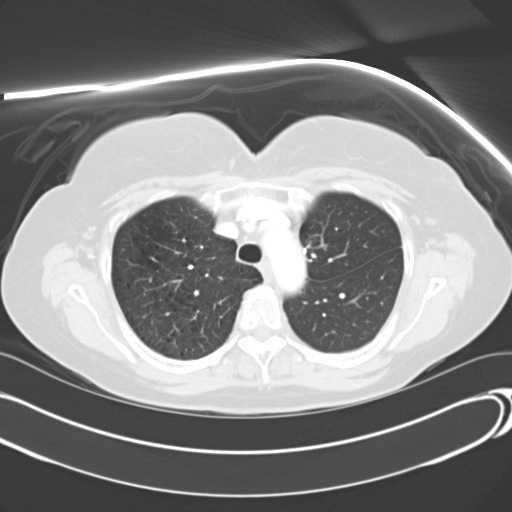
[im 52/62  lung]
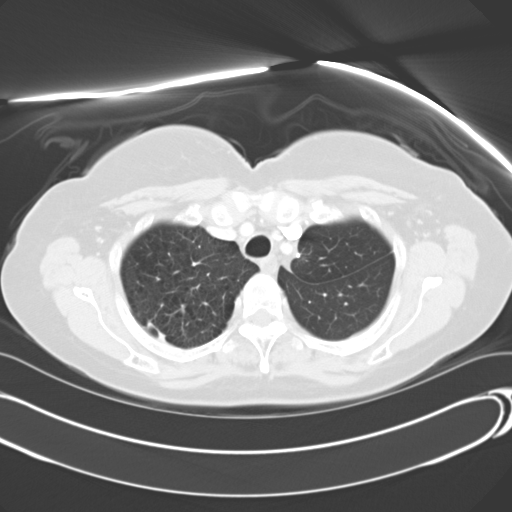
[im 57/62  lung]
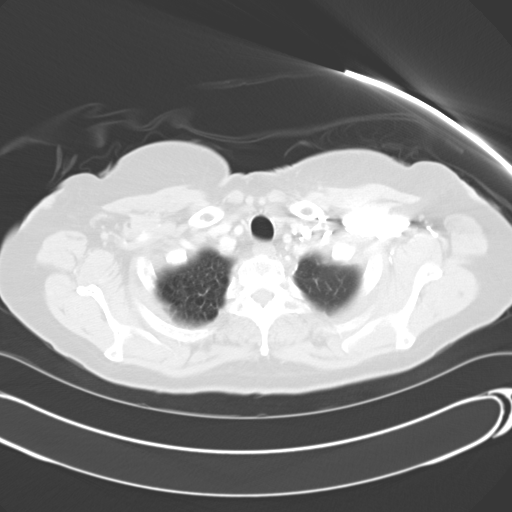

[Series 602: cor chest · coronal · 0.72mm/px · 3 of 103 slices shown]
[im 21/103  lung]
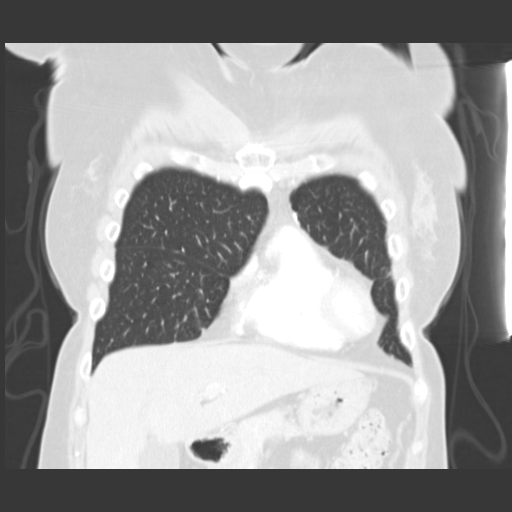
[im 41/103  lung]
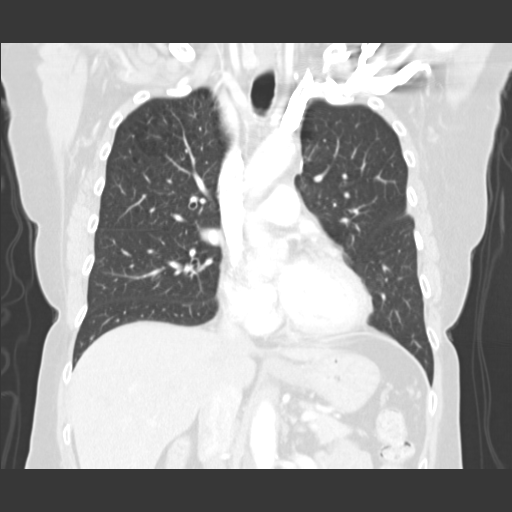
[im 62/103  lung]
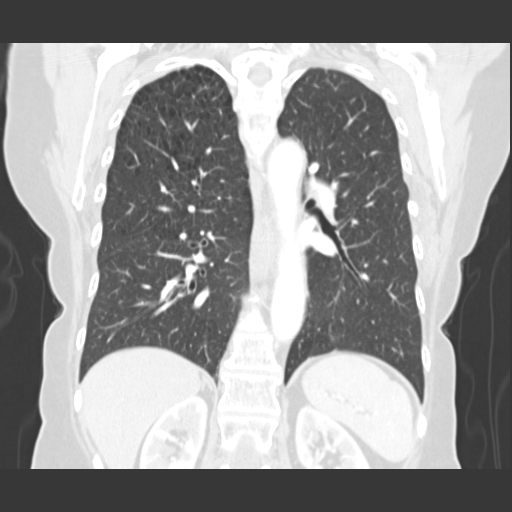

[15 of 36 positions shown; findings below may reference images not displayed]

FINDINGS: No evidence of axillary or supraclavicular
lymphadenopathy.  No mediastinal or hilar lymphadenopathy.  No
pericardial effusion.

Review of lung parenchyma demonstrates mild scarring and emphysema
at the apices which is unchanged.  6 mm nodule in the right upper
lobe (image 14) is unchanged.  Within the right middle lobe, there
is a 8 mm nodule which is unchanged in size but less well defined
than on comparison.

Within the left upper lobe there is a postsurgical change with no
increase in nodularity along the surgical line.  Airways appear
normal.

Limited view of the upper abdomen shows normal adrenal glands.  No
hepatic lesion identified. Stable small periaortic lymph nodes.
Limited view of the skeleton is unremarkable.
IMPRESSION: 1.  Stable exam of the chest compared to [DATE].
2.  Stable postsurgical change in the left upper lobe.
3.  Stable pulmonary nodules in the right lung.

## 2009-12-29 ENCOUNTER — Encounter: Admission: RE | Admit: 2009-12-29 | Discharge: 2009-12-29 | Payer: Self-pay | Admitting: Family Medicine

## 2009-12-29 IMAGING — MG MM DIGITAL SCREENING
4 series · 4 of 4 positions shown · non-contrast
Comparison: none

DG SCREEN MAMMOGRAM BILATERAL
Bilateral CC and MLO view(s) were taken.

DIGITAL SCREENING MAMMOGRAM WITH CAD:
The breast tissue is extremely dense.  No masses or malignant type calcifications are identified.  
Compared with prior studies.
Images were processed with CAD.

[R CC]
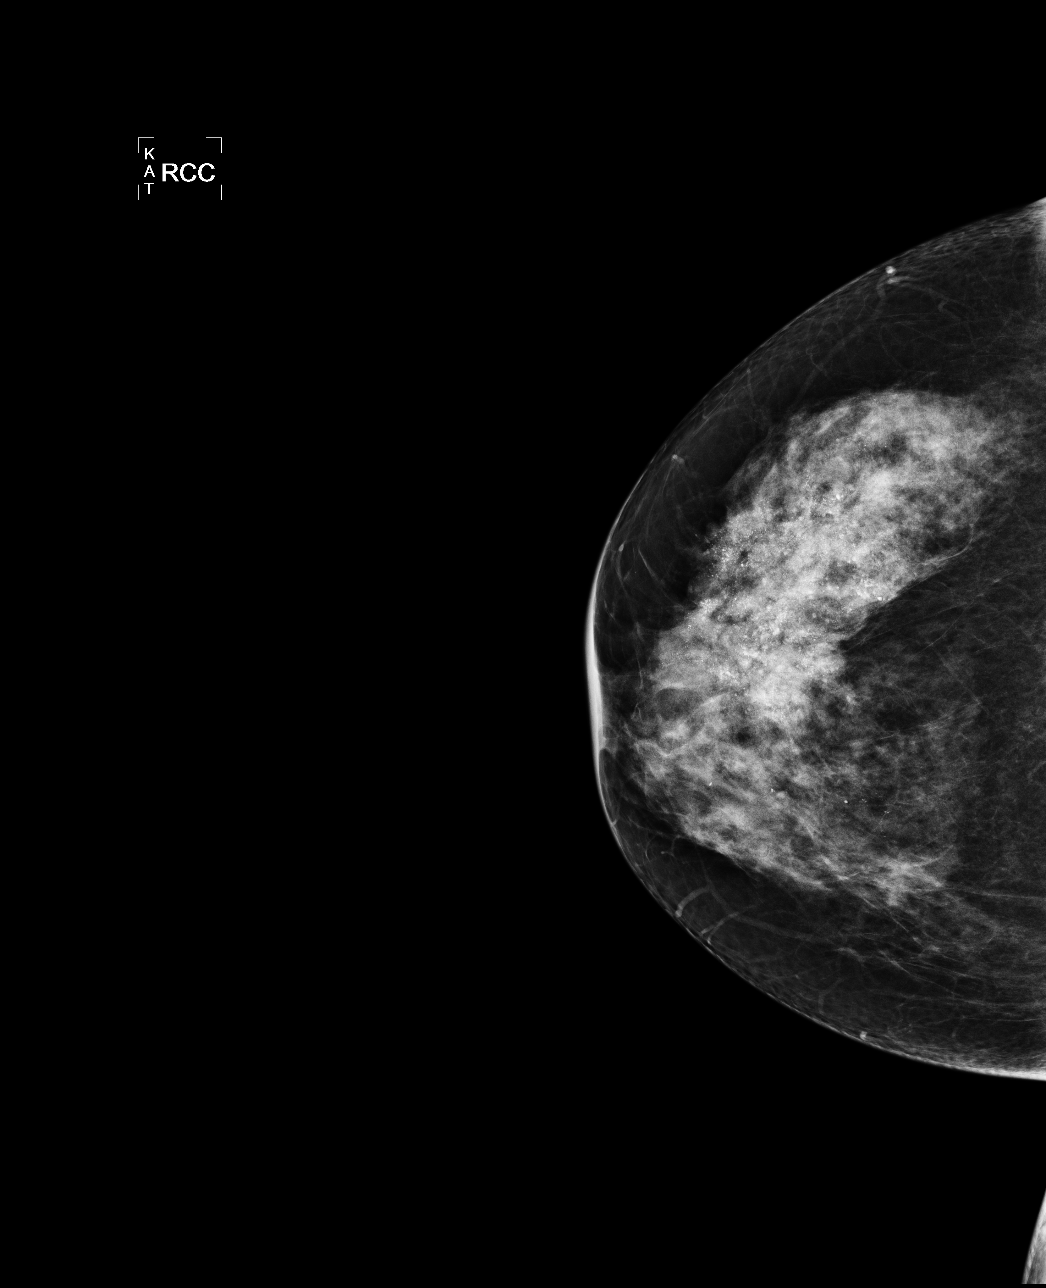

[L CC]
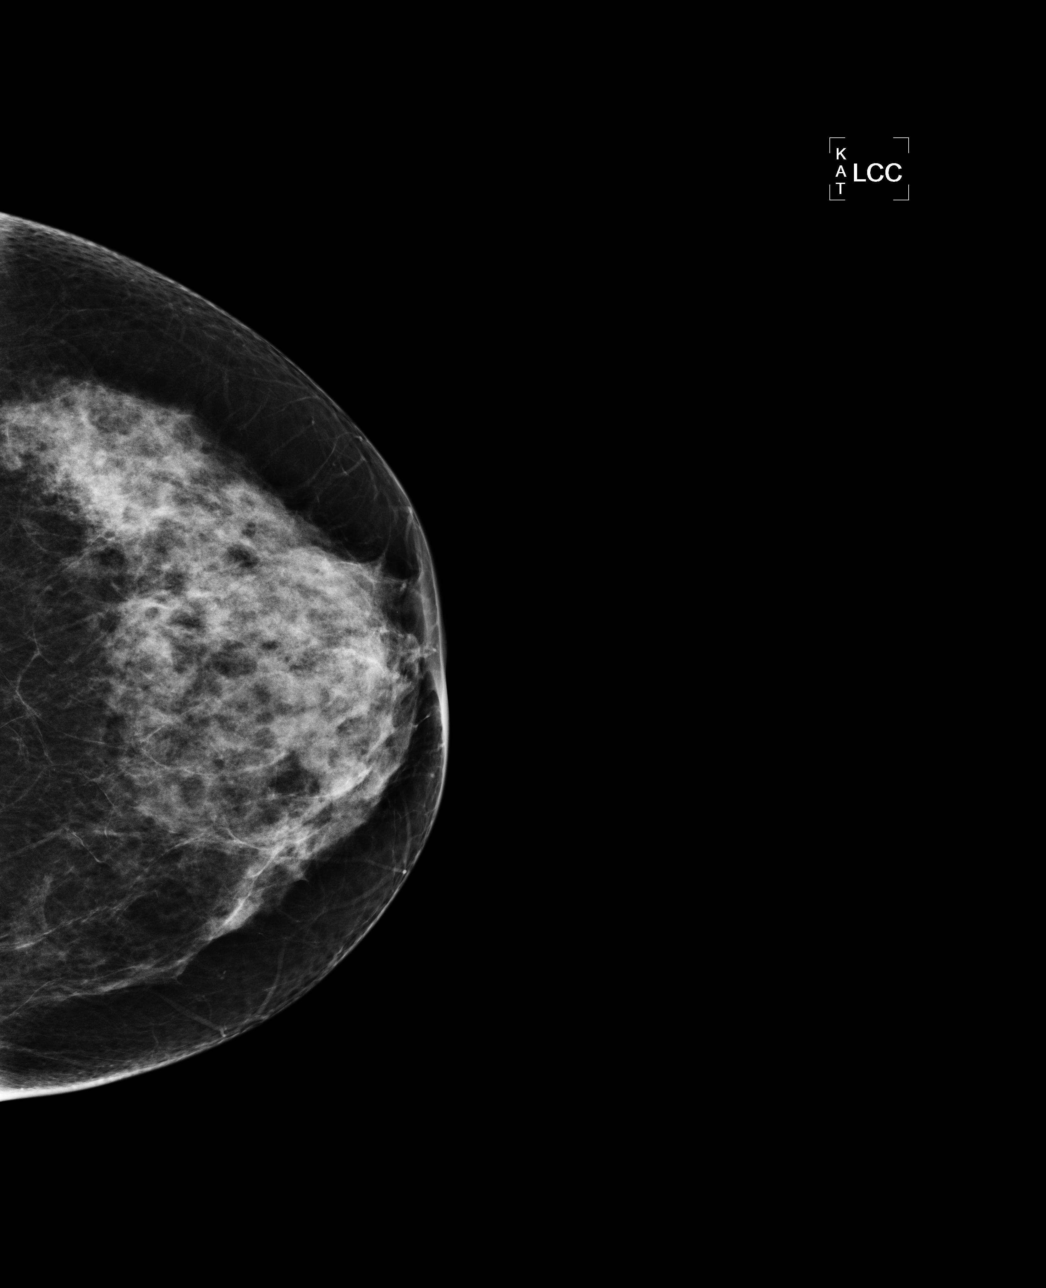

[L MLO]
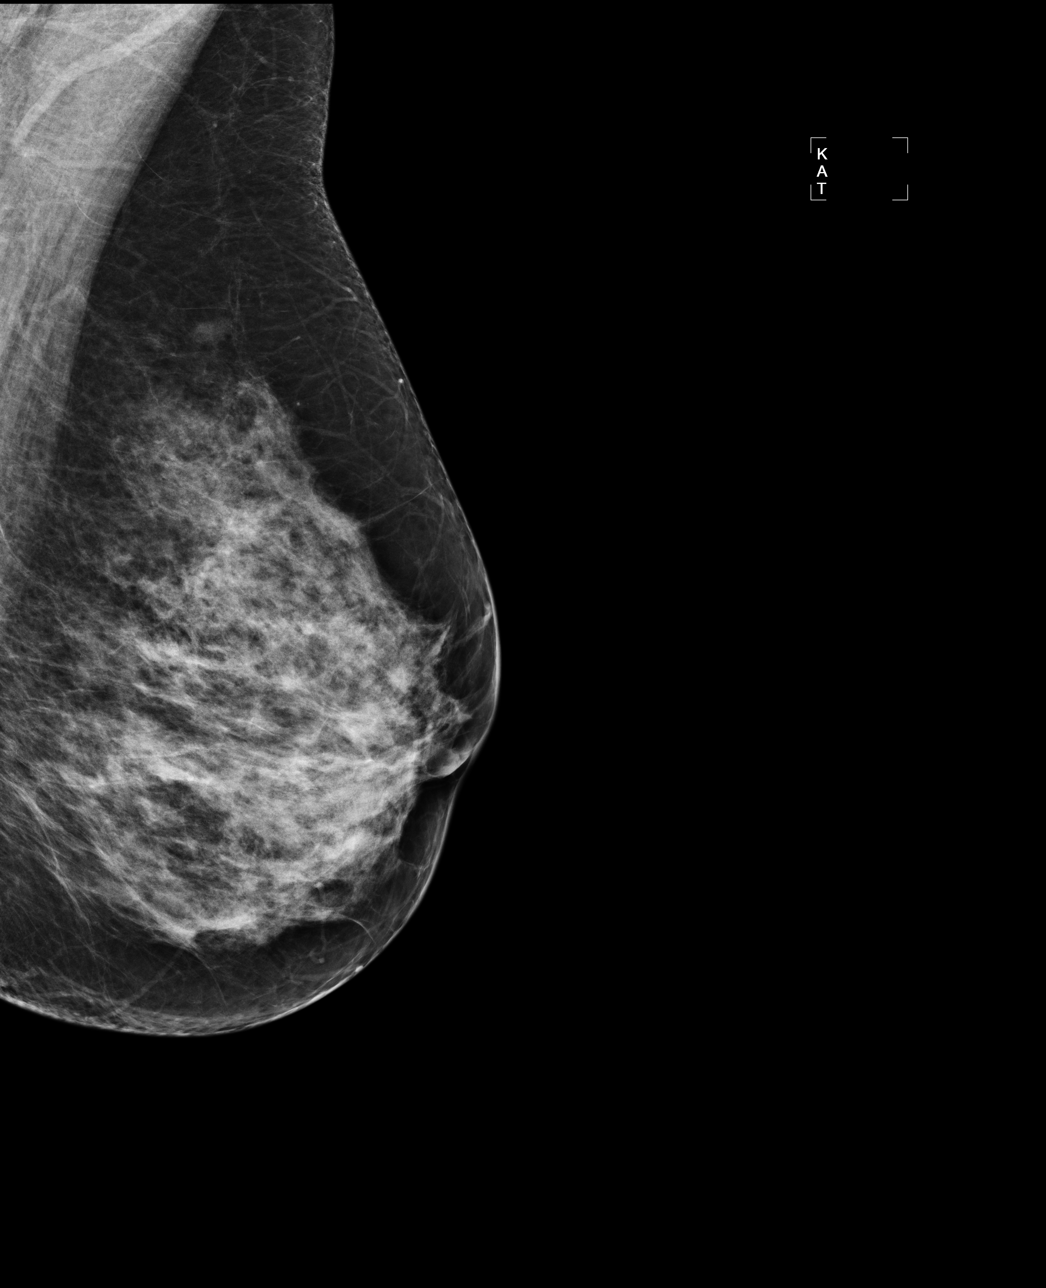

[R MLO]
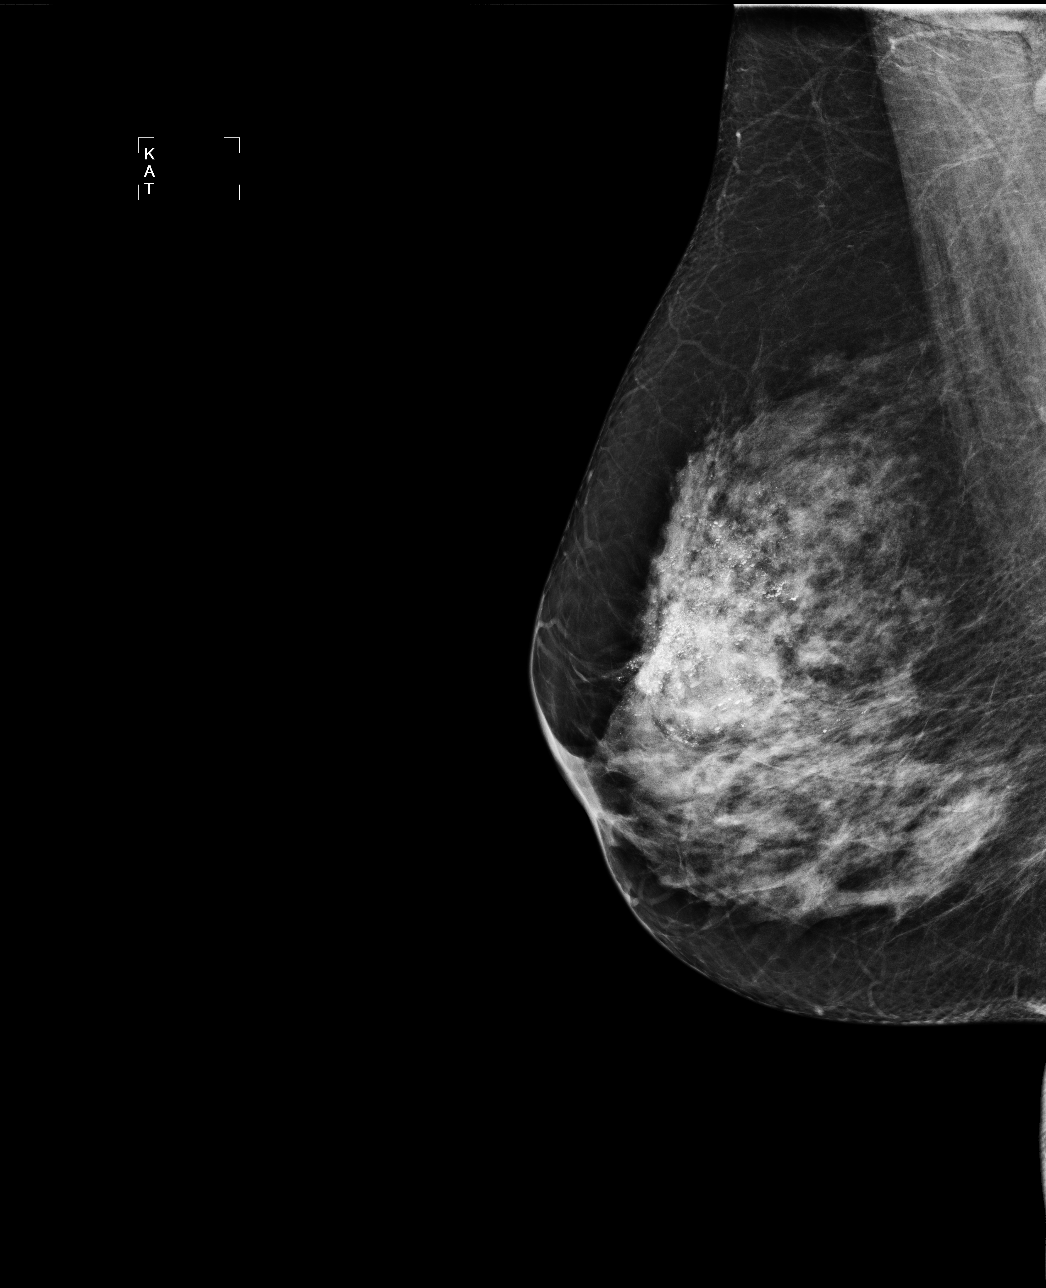

[4 of 4 positions shown; findings below may reference images not displayed]

IMPRESSION: No specific mammographic evidence of malignancy.  Next screening mammogram is recommended in one 
year.

A result letter of this screening mammogram will be mailed directly to the patient.

ASSESSMENT: Negative - BI-RADS 1

Screening mammogram in 1 year.
,

## 2010-03-21 ENCOUNTER — Encounter
Admission: RE | Admit: 2010-03-21 | Discharge: 2010-03-21 | Payer: Self-pay | Source: Home / Self Care | Attending: Family Medicine | Admitting: Family Medicine

## 2010-03-21 IMAGING — US US SOFT TISSUE HEAD/NECK
1 series · 14 of 25 positions shown · non-contrast
Comparison: Reported images from thyroid biopsy dated [DATE]

CLINICAL DATA: Thyroid nodule.

THYROID ULTRASOUND
TECHNIQUE: Ultrasound examination of the thyroid gland and adjacent
soft tissues was performed.

[Series 1: us soft tissue head/neck · 0.04mm/px · 14 of 62 slices shown]
[im 1/62]
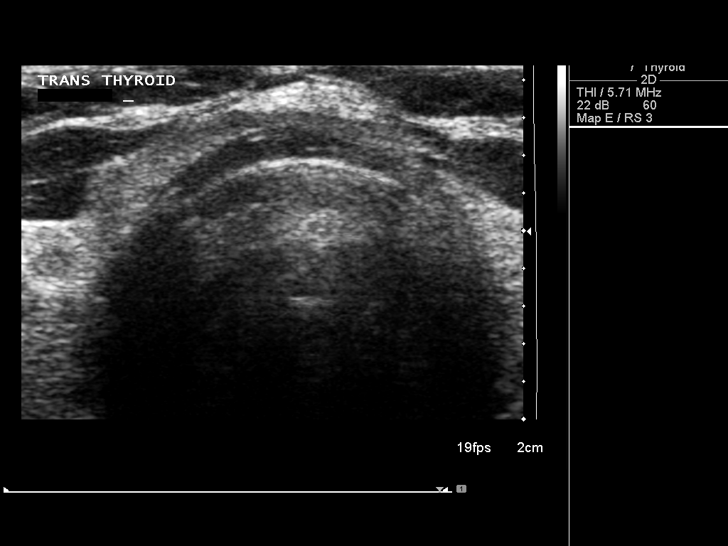
[im 6/62]
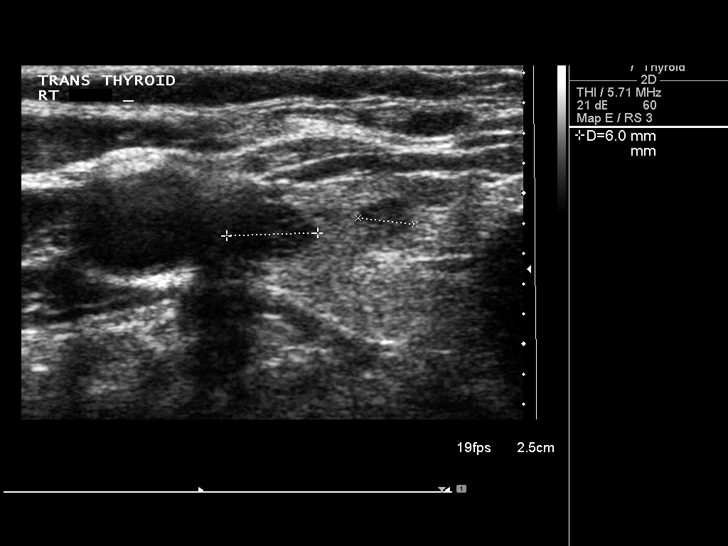
[im 11/62]
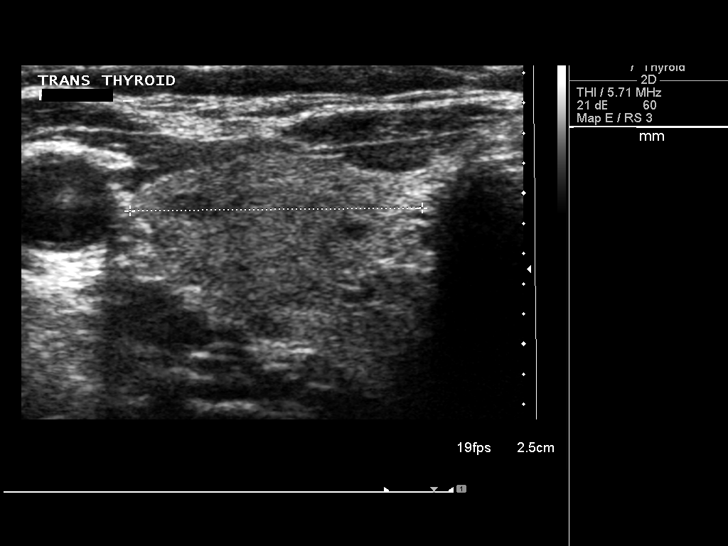
[im 16/62]
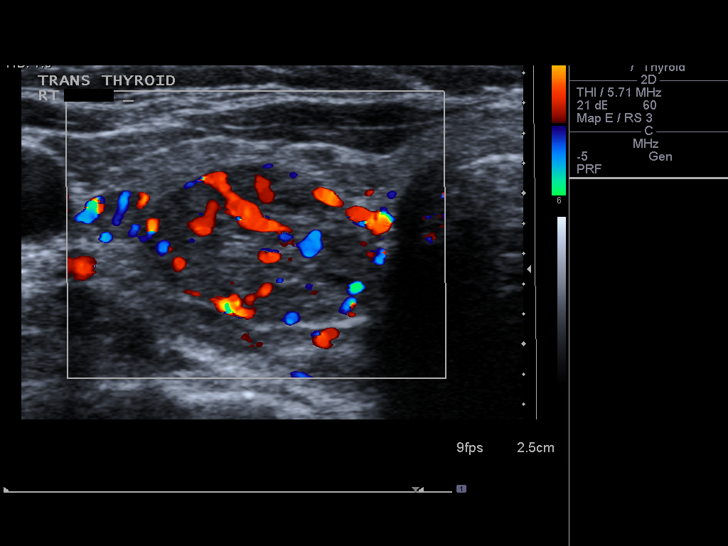
[im 21/62]
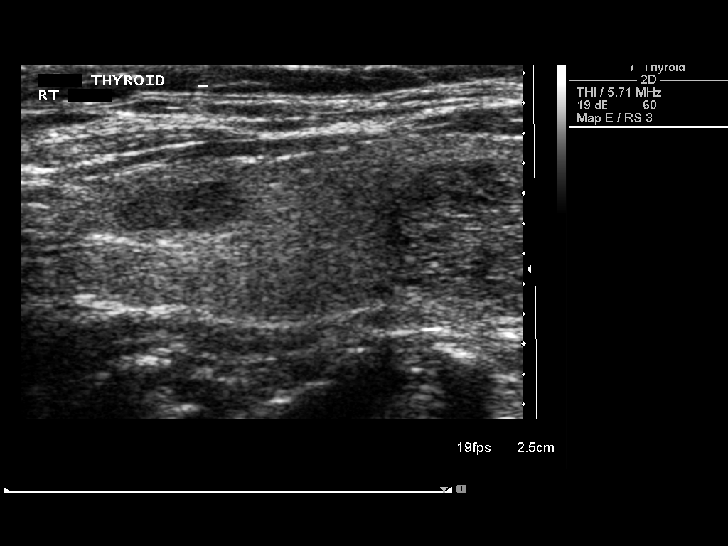
[im 23/62]
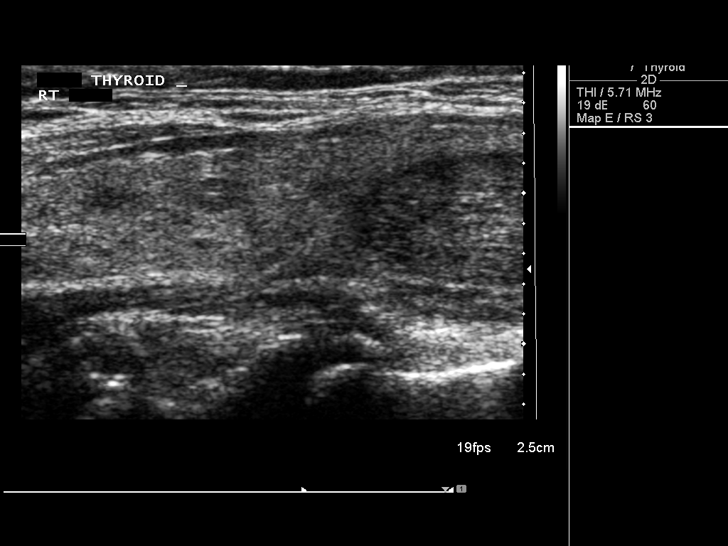
[im 28/62]
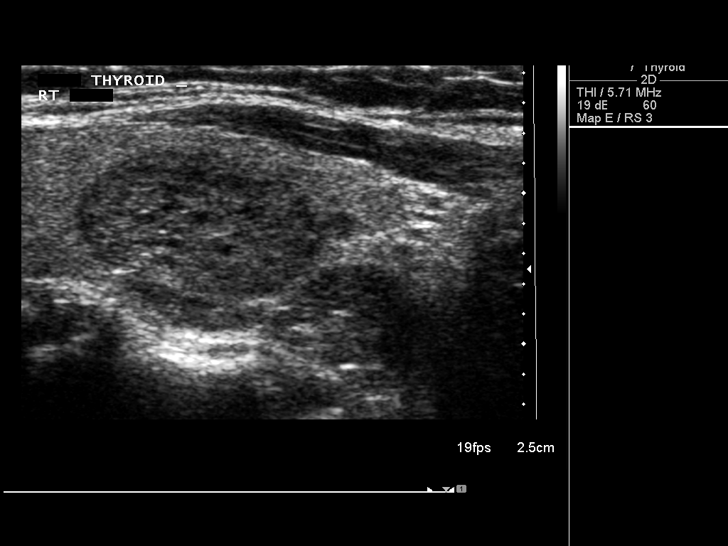
[im 34/62]
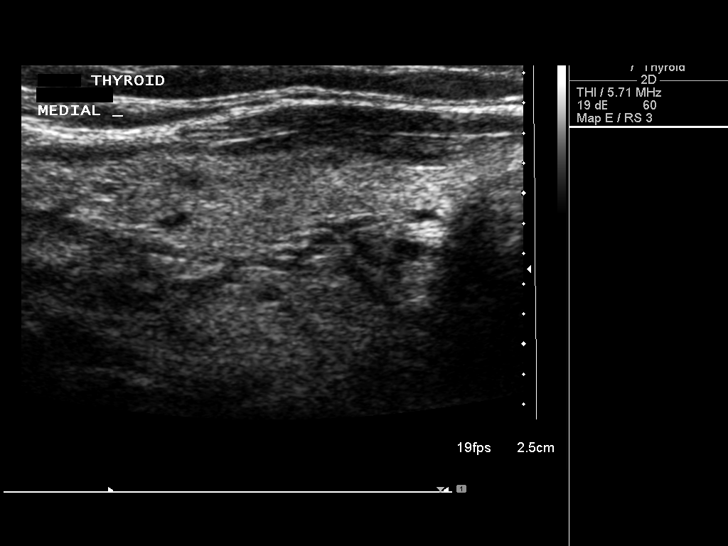
[im 39/62]
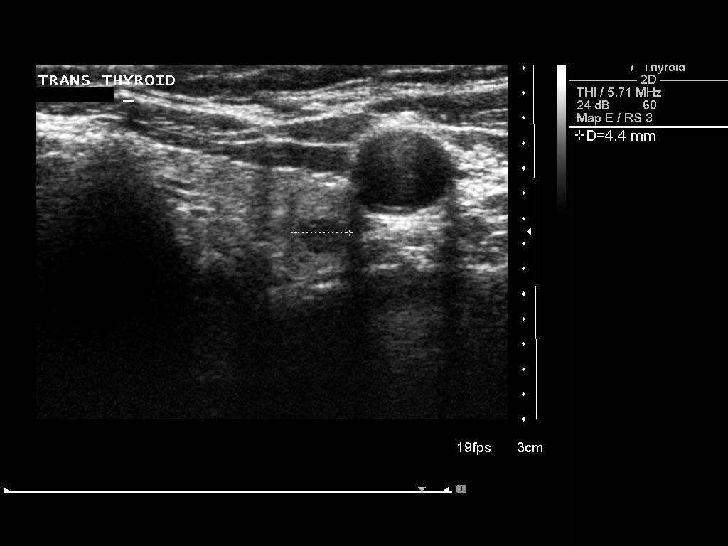
[im 41/62]
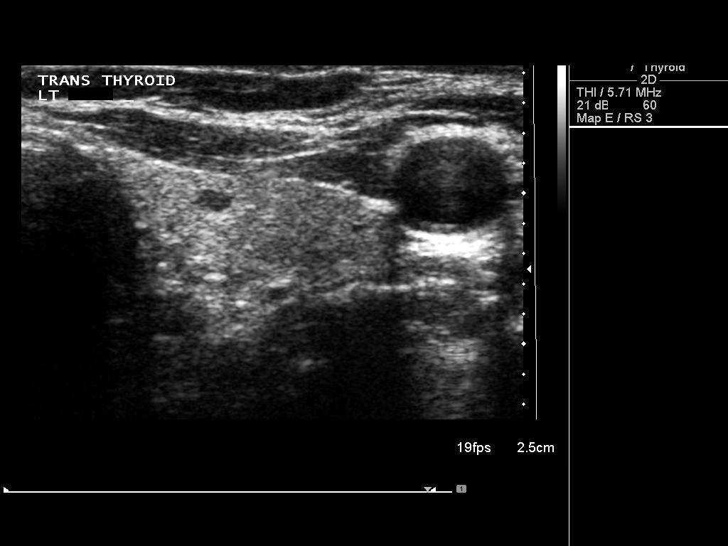
[im 46/62]
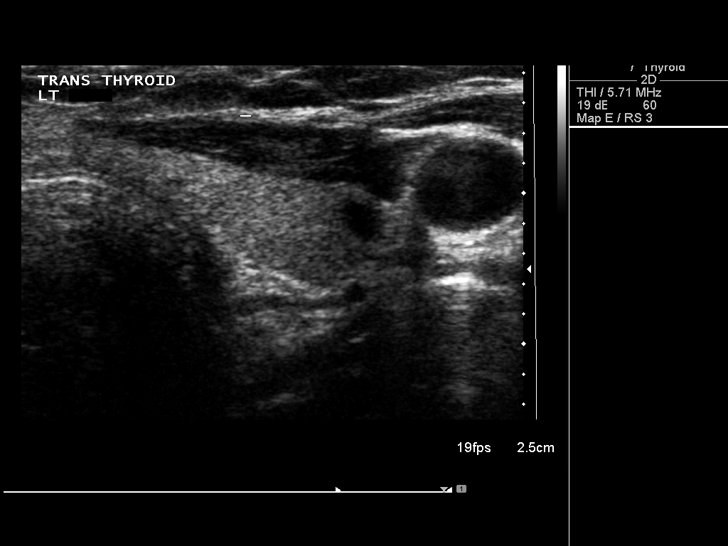
[im 51/62]
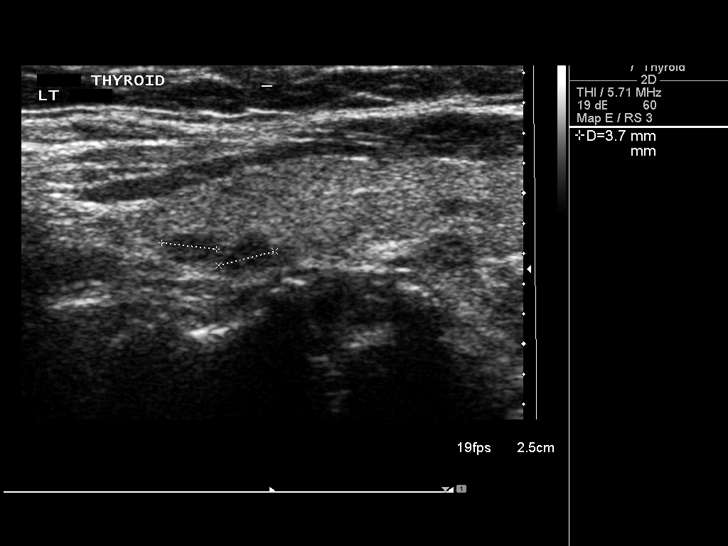
[im 56/62]
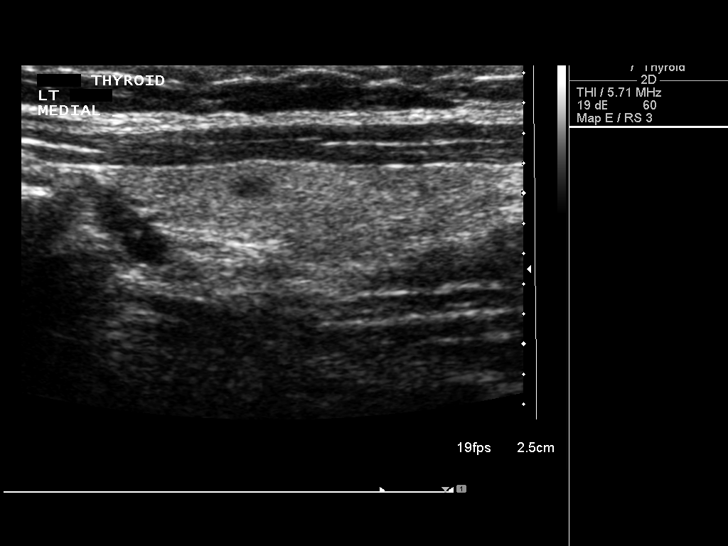
[im 62/62]
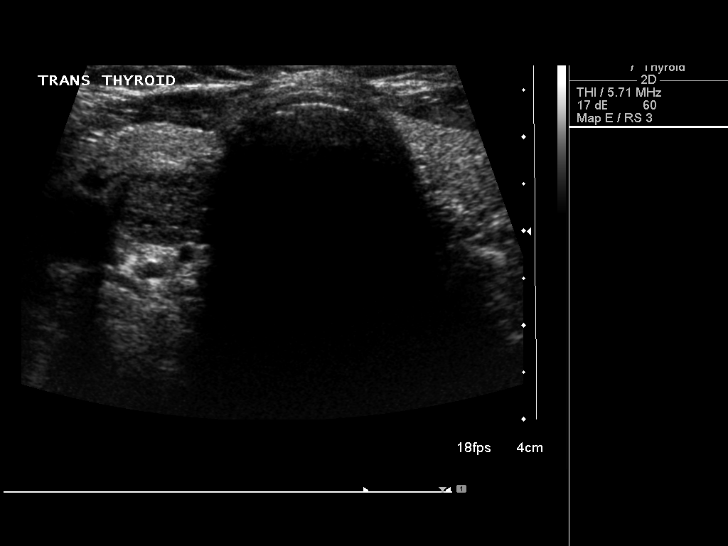

[14 of 25 positions shown; findings below may reference images not displayed]

FINDINGS: Right thyroid lobe:  5.2 x 1.4 x 1.9 cm
Left thyroid lobe:  4.2 x 1.0 x 1.5 cm
Isthmus:  0.23 cm

Focal nodules:  There is a dominant 1.7 x 1.0 x 1.6 cm solid nodule
in the lower pole of the right lobe.  There is an adjacent 1.1 x
0.8 x 1.0 cm solid nodule just below the larger nodule.

There are numerous hypoechoic nodules bilaterally less than 6 mm in
size.

Lymphadenopathy:  None visualized.
IMPRESSION: The previously biopsied  nodule in the lower pole of the right lobe
has slightly enlarged since the prior exam.  There is a new 1.1 cm
nodule just inferior to this larger nodule.

The other numerous small nodules are essentially unchanged.  The
findings are  consistent with multi nodular goiter.

## 2010-04-15 ENCOUNTER — Encounter: Payer: Self-pay | Admitting: Thoracic Surgery

## 2010-04-16 ENCOUNTER — Encounter: Payer: Self-pay | Admitting: Thoracic Surgery

## 2010-04-21 ENCOUNTER — Other Ambulatory Visit (HOSPITAL_COMMUNITY): Payer: Self-pay | Admitting: Internal Medicine

## 2010-04-21 DIAGNOSIS — E049 Nontoxic goiter, unspecified: Secondary | ICD-10-CM

## 2010-06-05 ENCOUNTER — Ambulatory Visit (HOSPITAL_COMMUNITY)
Admission: RE | Admit: 2010-06-05 | Discharge: 2010-06-05 | Disposition: A | Discharge status: Home / Self Care | Payer: Managed Care, Other (non HMO) | Source: Ambulatory Visit | Attending: Internal Medicine | Admitting: Internal Medicine

## 2010-06-05 DIAGNOSIS — E049 Nontoxic goiter, unspecified: Secondary | ICD-10-CM

## 2010-06-06 ENCOUNTER — Encounter (HOSPITAL_COMMUNITY)
Admission: RE | Admit: 2010-06-06 | Discharge: 2010-06-06 | Disposition: A | Discharge status: Home / Self Care | Payer: Managed Care, Other (non HMO) | Source: Ambulatory Visit | Attending: Internal Medicine | Admitting: Internal Medicine

## 2010-06-06 DIAGNOSIS — E041 Nontoxic single thyroid nodule: Secondary | ICD-10-CM | POA: Insufficient documentation

## 2010-06-06 MED ORDER — SODIUM PERTECHNETATE TC 99M INJECTION
10.0000 | Freq: Once | INTRAVENOUS | Status: AC | PRN
  Administered 2010-06-06: 10 via INTRAVENOUS

## 2010-06-06 MED ORDER — SODIUM IODIDE I 131 CAPSULE
20.0000 | Freq: Once | INTRAVENOUS | Status: AC | PRN
  Administered 2010-06-05: 20 via ORAL

## 2010-08-11 NOTE — H&P (Signed)
[name], [name]               ACCOUNT NO.:  [id number]   MEDICAL RECORD NO.:  [id number]          PATIENT TYPE:  INP   LOCATION:  1312                         FACILITY:  [facility]   PHYSICIAN:  Lauretta I. Odogwu, M.D.DATE OF BIRTH:  04/28/1950   DATE OF ADMISSION:  09/18/2004  DATE OF DISCHARGE:                                HISTORY & PHYSICAL   HISTORY OF PRESENT ILLNESS:  A 60 year old woman, patient of Dr. Arbutus Ped  with lung cancer, directly admitted with neutropenic fever. The patient  presents with a fever of 102 degrees Fahrenheit. Admits to fatigue, aching,  chills, and joint aches. She denies cough, shortness of breath, abdominal  pain, diarrhea, and dysuria. The patient was diagnosed with stage 2-A  nonsmall cell lung cancer in March 2006. She is status post left  thoracotomy, wedge dissection, and node dissection revealing a 1.2 cm large  cell cancer with no pleural involvement but one node involvement with liver  vascular invasion. She has received 2 cycles of carboplatin and Taxol in the  adjuvant setting. Her last cycle was on September 12, 2004 followed by Neulasta.   PAST MEDICAL HISTORY:  1.  Large cell cancer diagnosed in March 2006.  2.  Chronic sinusitis.  3.  Inflammatory bowel disease.   MEDICATIONS:  Nexium 40 mg daily, Neurontin 200 mg t.i.d., multivitamin,  Oxycodone 5 mg q. 4 p.r.n. for pain.   ALLERGIES:  CEFTIN (causes diarrhea), ASPIRIN (intolerant, causes tinnitus).   FAMILY HISTORY:  Pertinent in that father had CML.   SOCIAL HISTORY:  She is married. Warden/ranger. Smokes 30 pack per  year and has minimal alcohol use.   REVIEW OF SYSTEMS:  CONSTITUTIONAL:  Admits to chills and fever. Denies  night sweats, weight loss, anorexia.  GASTROINTESTINAL:  Denies nausea, vomiting, abdominal pain, diarrhea, and  constipation. RESPIRATORY:  Denies cough, hemoptysis, wheezing, and  shortness of breath. CARDIOVASCULAR:  Denies chest pain, paroxysmal  nocturnal dyspnea, orthopnea, and ankle swelling. GENITOURINARY:  Denies  dysuria and hematuria. SKIN:  No rashes or nodules. NEUROLOGIC:  No  headache, vision changes, or extremity weakness.   PHYSICAL EXAMINATION:  GENERAL:  The patient is alert and oriented x3.  VITAL SIGNS:  Pulse 125, blood pressure 106/68, temperature 101.3,  respiratory rate 20, saturating 98%.  HEENT:  Mouth reveals no thrush or ulcerations.  NECK:  Supple with no increasing jugular venous distention.  CHEST:  Clear to auscultation and percussion.  CARDIOVASCULAR:  Examination reveals first and second heart sounds to be  present. No other sounds or murmurs.  ABDOMEN:  Soft and non-tender. Bowel sounds were present.  EXTREMITIES:  No edema.   LABORATORY DATA:  CBC: White cell count of 0.9. Hemoglobin and hematocrit  11.7 and 33.7. Platelets 155,000. No evidence of neutrophils.   IMPRESSION:  60.  A 60 year old woman with stage 2-A large cell cancer of the lung      diagnosed in March 2006. She is status post thoracotomy, wedge      resection, and node resection. Status post cycle 2 carboplatin and Taxol      on  September 12, 2004 followed by Neulasta.  2.  Neutropenic fever secondary to chemotherapy, admitted with neutropenic      precautions. Pan-culture. Chest x-ray. The patient will be commenced on      intravenous fluids and broad spectrum antibiotics.       LIO/MEDQ  D:  09/18/2004  T:  09/18/2004  Job:  [id number]   cc:   Lajuana Matte, MD  Fax: [phone]

## 2010-08-11 NOTE — H&P (Signed)
[name], [name]               ACCOUNT NO.:  [id number]   MEDICAL RECORD NO.:  [id number]          PATIENT TYPE:  INP   LOCATION:  NA                           FACILITY:  [facility]   PHYSICIAN:  Ines Bloomer, M.D. DATE OF BIRTH:  [birth year]   DATE OF ADMISSION:  07/12/2004  DATE OF DISCHARGE:                                HISTORY & PHYSICAL   CHIEF COMPLAINT:  Left lung mass.   HISTORY OF PRESENT ILLNESS:  This 60 year old Caucasian female has just quit  smoking.  She smoked for many years.  She was having pain in her left arm  and was being worked up for cervical disk disease.  A CT scan revealed a  nodule in the left upper lobe.  She underwent a CT scan that showed left  emphysematous change in the left upper lobe, a left upper lobe nodule, and  some small pleural scarring in the right apex and there was also a small 8  mm nodule in the right middle lobe that appeared to be benign.  She had no  adenopathy.  She was referred to Danice Goltz, M.D. [facility] who ordered a PET  scan that showed increased uptake in her thyroid as well as an SUV of 3.8  and a left upper lobe nodule.  A needle biopsy was done of the thyroid that  revealed a non-neoplastic goiter and she has been referred for workup for  her thyroid abnormality.  She has been treated for chronic sinusitis and  allergy problems.  Had no cough or sputum, or history of hemoptysis.  She  does have a history of acid reflux.  Denies any weight loss or loss of  appetite.   PAST MEDICAL HISTORY:  She has had some weakness in her hands and arms that  was thought to be secondary to cervical disk disease and at one time she was  thought to have possible multiple sclerosis.  She also has chronic  sinusitis.   PAST SURGICAL HISTORY:  She had endoscopy and sinus surgery in 1988 and  1990.  She had an appendectomy in 1971 and multiple hand surgeries in 1992,  2000, 2002, and 2003.   MEDICATIONS:  1.  Nexium 30 mg a day.  2.   Flonase.  3.  Neurontin three times a day.   ALLERGIES:  CEFTIN.   FAMILY HISTORY:  Positive and her brother had a brain aneurysm and her  father had an aortic valve replacement at age 31.   SOCIAL HISTORY:  She is married and has one child.  Works as a Teacher, early years/pre.  Does not drink alcohol on a regular basis, but has at least a 30-  pack-year history of smoking.   REVIEW OF SYSTEMS:  VITAL SIGNS:  She is 130 pounds, she is 5 feet 4 inches.  She says her weight fluctuates, but has been stable.  CARDIAC:  No history  of angina or arrhythmias.  PULMONARY:  No history of asthma and see history  of present illness.  GASTROINTESTINAL:  She has been treated for GERD and  has chronic  abdominal pain with diarrhea and constipation.  Was thought  sometimes to have irritable bowel syndrome.  GENITOURINARY:  No history of  dysuria or frequent urinations.  VASCULAR:  No claudication, TIA's, or DVT.  NEUROLOGY:  No history of seizures, blackouts, or frequent headaches.  MUSCULOSKELETAL:  She has chronic joint pain and cervical disk disease with  numbness and Stefani Dama, M.D. will evaluate her in the future.  EYES/ENT:  No change in her eye sight or hearing.  SKIN:  Negative.   PHYSICAL EXAMINATION:  GENERAL:  She is a well-developed, Caucasian female,  in no acute distress.  VITAL SIGNS:  Blood pressure 172/80, pulse 63, respirations 18, saturations  97%.  Pulmonary function tests showed a FVC of 3.28, FEV1 2.54 with a normal  spirometry.  HEENT:  Head is atraumatic.  Eyes; pupils equal, round, and reactive to  light and accommodation.  Extraocular movements normal.  Saturations are  97%.  Ears; tympanic membranes are intact.  Nares; there is some mild  erythema.  No rhinorrhea or rhinitis.  Throat is without lesions.  NECK:  Supple, no thyromegaly.  No supraclavicular or axillary adenopathy.  CHEST:  Clear to auscultation and percussion.  HEART:  Regular sinus rhythm with no  murmurs.  ABDOMEN:  Soft. There is no hepatosplenomegaly.  Pulses are 2+.  EXTREMITIES:  No clubbing or edema.  NEUROLOGY:  Oriented x3.  Sensory and motor intact.  Cranial nerves II-XII  grossly intact.  SKIN:  Without lesions.   IMPRESSION:  1.  Left upper lobe nodule.  2.  History of chronic sinusitis.  3.  History of tobacco abuse.  4.  History of possible cervical disk disease.  5.  Thyroid goiter.   PLAN:  Left VATS, resection of the left upper lobe lesion with node  sampling.      DPB/MEDQ  D:  07/12/2004  T:  07/12/2004  Job:  [id number]

## 2010-08-11 NOTE — Op Note (Signed)
NAME:  [name], [name]                         ACCOUNT NO.:  [id number]   MEDICAL RECORD NO.:  [id number]                   PATIENT TYPE:  AMB   LOCATION:  DSC                                  FACILITY:  [facility]   PHYSICIAN:  Harvie Junior, M.D.                DATE OF BIRTH:  01-05-1951   DATE OF PROCEDURE:  07/31/2002  DATE OF DISCHARGE:                                 OPERATIVE REPORT   PREOPERATIVE DIAGNOSES:  1. Trigger finger, left little, ring, and index.  2. DeQuervain's tenosynovitis, left wrist.   POSTOPERATIVE DIAGNOSES:  1. Trigger finger, left little, ring, and index.  2. DeQuervain's tenosynovitis, left wrist.   PROCEDURES:  1. Left DeQuervain's release.  2. Left little finger trigger release.  3. Left ring finger trigger release.  4. Left index finger trigger release.   SURGEON:  Harvie Junior, M.D.   ASSISTANT:  Marshia Ly, P.A.   ANESTHESIA:  Bier block.   BRIEF HISTORY:  She is a 60 year old female with a long history of having  multiple trigger fingers in the past.  She has had these injected.  She has  had them operated on ultimately.  In her left hand she has had significant  triggering of her index, little, and ring fingers, and she opted for  surgical intervention after failure of injection therapy and anti-  inflammatory medication.  She also had significant DeQuervain's  tenosynovitis.  She went through two injections and had failure of these  injections to relieve the pain and because of that, she ultimately was taken  to the operating room for release of her DeQuervain's tenosynovitis.   DESCRIPTION OF PROCEDURE:  The patient was taken to the operating room and  after an adequate level of anesthesia was obtained with a general  anesthetic, the patient was placed supine upon the operating table.  The  left arm was then prepped and draped in the usual sterile fashion after  administration of a Bier block.  At this point the incisions were made  over  the little finger, the subcutaneous tissue was taken down to the level of  the flexor tendon, and the flexor tendon was clearly identified and a  longitudinal incision was made both proximally and distally.  The flexor  tendon was identified.  The A2 pulley was clearly identified and divided  over the length of its run.  At this point the flexor tendon was grasped  with a right angle retractor and the flexor tendon was pulled out, no  evidence of triggering.  At this point the wound was copiously irrigated and  attention was turned to the ring finger, where incision was made,  subcutaneous tissue taken down to the level of the flexor tendon.  The  flexor tendon was clearly identified proximally and distally at the A2  pulley.  The extent of the A2 pulley was clearly identified, and this  was  divided over its length.  The flexor tendons were then grasped with a right  angle retractor and pulled into the wound.  No evidence of triggering at  this point.  Attention was then turned to the index finger, where incision  was made, subcutaneous tissue taken down to the level of the flexor tendon.  The flexor tendon was identified and divided.  The A2 pulley was clearly  identified and divided proximally and distally along the length of the  tendon.  The tendon was then grasped with a right angle retractor and pulled  into the wound.  No evidence of triggering at this point.  These wounds were  copiously irrigated and suctioned dry.  The attention then turned to the  extensor side of the hand, where incision was made over the first extensor  compartment.  The extensor pollicis brevis and the abductor pollicis longus  were identified.  The tendons were identified in multiple slips, and these  slips were released.  Some remnant of cortisone was identified.  Care was  taken during this procedure to protect and identify the branches of the  radial nerve, which were in this area.  After release of  the extensor  tendons on this side, the wound was then copiously irrigated and suctioned  dry.  All of these wounds were then closed with 4-0 nylon interrupted  sutures, a sterile and compressive dressing was applied.  The patient was  taken to the recovery room, where she was noted to be in satisfactory  condition.  Estimated blood loss for this procedure was none.                                               Harvie Junior, M.D.    Ranae Plumber  D:  07/31/2002  T:  08/01/2002  Job:  [id number]

## 2010-08-11 NOTE — Discharge Summary (Signed)
[name], [name]               ACCOUNT NO.:  [id number]   MEDICAL RECORD NO.:  [id number]          PATIENT TYPE:  INP   LOCATION:  2021                         FACILITY:  [facility]   PHYSICIAN:  Ines Bloomer, M.D. DATE OF BIRTH:  [birth year]   DATE OF ADMISSION:  07/14/2004  DATE OF DISCHARGE:  07/20/2004                                 DISCHARGE SUMMARY   ADDENDUM:  The patient was originally scheduled for tentative discharge on  July 19, 2004.  Upon evaluation in the morning, the patient was deemed not  quite acceptable for discharge at that time.  The patient still required  oncology consultation.  Additionally, the patient was noted to have  significant thrush and was started on both Magic mouth wash and Diflucan.  The patient was seen by Dr. Gwenyth Bouillon.  He recommended followup at the  [facility] for detailed consultation and treatment two weeks  post discharge.  On April 27, the patient was deemed to be acceptable for  discharge.   DISCHARGE MEDICATIONS:  As previously dictated.  Additionally, Diflucan 100  mg daily for five days.   All other instructions and followup were as previously dictated.   FINAL DIAGNOSES:  As previously dictated with the addition of thrush.   CONDITION ON DISCHARGE:  Stable and improved.      Wayne   WEG/MEDQ  D:  10/11/2004  T:  10/12/2004  Job:  [id number]   cc:   Lajuana Matte, MD  Fax: [phone]   Holley Bouche, M.D.  [address]  [place], [place] [address]  Fax: [phone]

## 2010-08-11 NOTE — Discharge Summary (Signed)
[name], [name]               ACCOUNT NO.:  [id number]   MEDICAL RECORD NO.:  [id number]          PATIENT TYPE:  INP   LOCATION:  2021                         FACILITY:  [facility]   PHYSICIAN:  Ines Bloomer, M.D. DATE OF BIRTH:  04-28-1950   DATE OF ADMISSION:  07/14/2004  DATE OF DISCHARGE:  07/19/2004                                 DISCHARGE SUMMARY   ADMISSION DIAGNOSIS:  Left upper lobe lung lesion.   DISCHARGE DIAGNOSIS:  1.  Large cell carcinoma, left upper lobe, stage T1N1MX.  2.  History of chronic sinusitis.  3.  History of tobacco abuse.  4.  Questionable for cervical disc disease.  5.  Thyroid goiter.  6.  History of appendectomy in 1971.  7.  History of wisdom teeth extraction in 1972.  8.  Multiple ENT surgeries.  9.  Multiple hand surgeries for trigger finger.  10. Intolerance to Septra which causes severe diarrhea with sensitivity as      well to aspirin, milk, bananas, and oranges.   BRIEF HISTORY:  [name] is a 60 year old Caucasian female who had smoked  for 30 plus years.  She recently quit smoking.  Recently, she was evaluated  for left arm pain and was being worked up for cervical disc disease.  A CT  scan revealed a nodule in the left upper lobe.  This also showed  emphysematous changes in the left upper lobe, left upper lobe nodule, and  some small pleural scarring in the right apex.  There was a small 8 mm  nodule in the right middle lobe that appeared to be benign.  There was no  adenopathy noted.  She was first referred to Dr. Danice Goltz who ordered  a PET scan which showed increased uptake in her thyroid as well as an SUV of  3.8 in the left upper lobe nodule.  A needle biopsy was done of the thyroid  which revealed a non-neoplastic goiter.  According to Dr. Scheryl Darter note, she  has been referred for workup for her thyroid abnormality.  She has also been  treated for chronic sinusitis and allergy problems.  She denied any cough or  sputum  or history of hemoptysis.  She does have reflux symptoms but denies  any weight loss or decrease in appetite.  Based on the findings of left  upper lobe lung lesion with SUV of 3.8, Dr. Edwyna Shell recommended that she  undergo a left VATS with resection of the left upper lobe lung lesion with  node sampling.   PROCEDURE:  July 14, 2004, left video assisted thoracoscopic surgery, left  thoracotomy, left upper trisegmentectomy with node dissection by Dr. Jovita Gamma.   CONSULTATIONS:  Hematology-oncology, Dr. Si Gaul; however, at the  time of this dictation, the consult is still pending.   HOSPITAL COURSE:  On July 14, 2004, [name] was electively admitted to  [facility] and did undergo left upper lobe trisegmentectomy with  lymph node dissection by Dr. Edwyna Shell.  Two chest tubes were placed  interoperatively.  Overall, she was felt to tolerate the procedure well  and  was extubated neurologically intact.  After a short stay in recovery, she  was transferred to unit 3300 step down unit for further care.  [name]  postoperative course was rather uneventful.  Her anterior chest tube was  discontinued by postoperative day two.  Her remaining chest tube was  discontinued on postoperative day three.  Her pain was initially managed  with morphine PCA pump, however, she transitioned well to oral narcotics as  needed.  She tolerated a regular diet and was weaned from supplemental  oxygen.  She remained hemodynamically stable.  Postoperative labs also  remained stable.  Pathology did show large cell carcinoma, stage IIA, as  discussed above.  Dr. Si Gaul was asked to see [name] in  consultation.  In the meantime, Dr. Edwyna Shell ordered a head CT scan which was  negative for metastatic disease or other acute findings.  [name] was  notified of her diagnosis and it is anticipated that she will need  chemotherapy in the near future.  By postoperative day five, [name] was  felt stable to transfer from stepdown unit out to the floor.  Chest x-ray  following final chest tube removal showed a stable apical pneumothorax on  the left.  This was measured at approximately 10% and it was felt that if  her chest x-ray remained stable and there were no changes in her status,  that she would be ready for discharge home on postoperative day five, July 19, 2004.  Hopefully by this time, she would have been evaluated by Dr.  Arbutus Ped.  If not, outpatient consultation will need to be arranged.   RADIOLOGY:  July 18, 2004, head CT showed no evidence of intracranial or  calvarial metastatic disease.  There were no acute intracranial findings.  July 18, 2004, chest x-ray showed persistent small 10% apical pneumothorax  on the left, persistent areas of pleural thickening and interstitial  fibrosis, persistent basilar atelectasis and small effusion, query right  upper lobe nodule.   LABORATORY DATA:  Recent labs showed white blood cell count 10.1, hemoglobin  11.3, hematocrit 32.9, platelet count 276.  Sodium 136, potassium 4.2,  chloride 104, CO2 27, BUN 3, creatinine 0.9, blood glucose 135.  SGOT 23,  SGPT 17, alkaline phos 57, total bilirubin 0.5, albumin 2.5.   DISCHARGE MEDICATIONS:  1.  Neurontin 300 mg p.o. t.i.d.  2.  Nexium 40 mg p.o. daily.  3.  Flonase p.r.n.  4.  Daily vitamin per home regimen.  5.  Tylox 1-2 tablets p.o. q.4h. p.r.n. pain.   DISCHARGE INSTRUCTIONS:  Activities:  She is to avoid heavy lifting, driving  or any strenuous activity.  She is to daily breathing exercises and daily  walking.  She may resume her regular diet.  She may shower and clean her  incisions gently with mild soap and water.  She should notify the [facility]  office if she develops fever greater than 101, redness or drainage from her  incision site, or increasing shortness of breath.  She is to follow up with  Dr. Edwyna Shell in the [facility] office on Wednesday, Jul 26, 2004,  at 1 p.m.  She is to  have a chest x-ray one hour before this appointment at [facility]  and is instructed to bring her chest x-ray with her to the [facility] office.  Follow up with Dr. Si Gaul will be determined prior to discharge.      AWZ/MEDQ  D:  07/18/2004  T:  07/18/2004  Job:  [id number]   cc:   Lajuana Matte, MD  Fax: [phone]   Holley Bouche, M.D.  [address]  [place], [place] [address]  Fax: [phone]

## 2010-08-11 NOTE — Op Note (Signed)
[place]. [facility]  Patient:    [name], [name]                      MRN: [id number] Proc. Date: 07/14/99 Adm. Date:  [id number] Attending:  Milly Jakob                           Operative Report  PREOPERATIVE DIAGNOSIS:  Trigger finger of index and little.  POSTOPERATIVE DIAGNOSIS:  Trigger finger of index and little.  PRINCIPAL PROCEDURE:  Release of A1 pulley of index and little finger.  SURGEON:  Harvie Junior, M.D.  ASSISTANT:  Kerby Less, P.A.  ANESTHESIA:  Forearm based IV regional.  BRIEF HISTORY:  The patient is a 60 year old female with a long history of having multiple trigger fingers.  She has had her thumb released previously.  We have tried multiple injections which have not been satisfactory, and because of persistent complaints of triggering, the patient is brought to the operating room for release A1 pulley.  DESCRIPTION OF PROCEDURE:  The patient was brought to the operating room and after adequate anesthesia was obtained with a general anesthetic, the patient was placed supine on the operating table.  The right arm was then prepped and draped in the usual sterile fashion.  Following this, an incision was made in the proximal palmar crease over the little finger.  The subcutaneous tissue was dissected down to the level of the A1 pulley which was identified and dissected in its entirety.  The  ______ were then pulled out of the wound with the right angle retractor.  Following this, attention was turned to the index finger where a similar procedure was performed.  The tendons were pulled out with a right angle retractor.  The wounds were then closed with 4-0 nylon interrupted sutures.  A sterile and compressive dressing was applied and the patient was taken to the recovery room  where she was noted to be in satisfactory condition.  Estimated blood loss for he procedure was none. DD:  07/14/99 TD:   07/14/99 Job: [id number]

## 2010-08-11 NOTE — Op Note (Signed)
[name], [name]               ACCOUNT NO.:  [id number]   MEDICAL RECORD NO.:  [id number]          PATIENT TYPE:  INP   LOCATION:  2899                         FACILITY:  [facility]   PHYSICIAN:  Ines Bloomer, M.D. DATE OF BIRTH:  11-Aug-1950   DATE OF PROCEDURE:  07/14/2004  DATE OF DISCHARGE:                                 OPERATIVE REPORT   PREOPERATIVE DIAGNOSIS:  Left upper lobe lesion.   POSTOPERATIVE DIAGNOSIS:  Non-small cell lung cancer, left upper lobe.   OPERATION PERFORMED:  Left video assisted thoracoscopic surgery, left  thoracotomy, left upper tri-segmentectomy with node dissection.   SURGEON:  Ines Bloomer, M.D.   FIRST ASSISTANT:  Rowe Clack, P.A.-C.   ANESTHESIA:  General.   After the percutaneous insertion of all monitoring lines, the patient  underwent general anesthesia.  She was turned to the left lateral  thoracotomy position and was prepped and draped in the usual sterile manner.  Two trocar sites were made in the anterior and posterior axillary line at  the 6th intercostal space.  Two trocars were inserted and a 30 degree scope  was inserted.  The lung was deflated.  In looking up in the apex of the  lung, you could see adhesions. Pictures were taken of this area, you could  see the lesion in the apex of the lung.  A third trocar site was made in the  posterior axillary line at the 5th intercostal space and then using all  three trocars, the scope was inserted through the anterior trocar site, in  the mid trocar site, the [facility] ring forceps to grasp the lesion, and the  posterior trocar site, to bring in first the Bovie to take down the  adhesions, and then the EZ-45 stapler to resect the lesion.  The lesion was  then put in a specimen bag, removed, and sent for frozen section, and  revealed non-small cell lung cancer of the left upper lobe.  It was then  decided to do a left upper lobe trisegmentectomy and the two lateral trocar  sites were  connected and latissimus was partially divided, the serratus was  resected anteriorly, a portion of the 6th rib was taken subperiosteally at  the angle and Tuffier was inserted.  Another Tuffier was placed at right  angles.  Dissection was started superiorly dissecting out the pulmonary  artery and then dissecting superiorly dissecting out the apical posterior  and the anterior branch to the pulmonary artery.  Then, the posterior and  anterior branch of the pulmonary vein were dissected out several 10L nodes  in doing this.  Then, the superior portion of the major fissure was divided  again with electrocautery and we identified the lingular branch.  Then,  using the autosuture 30 roticular, the anterior branch and the apical  posterior branch were divided with electrocautery.  Next, the apical  posterior branch of the posterior superior vein was divided with the  autosuture stapler.  This exposed the bronchus to the left upper lobe.  Multiple nodes were dissected off the bronchus 10L and 11L nodes and  then  the bronchus was then dissected free and stapled with an autosuture stapler.  The lung was expanded to see where the line between the lingula and anterior  and apical posterior segment were.  Then, the lung was divided with the EZ-  45 stapler with several applications.  CoSeal was applied to the staple line  and another chest tube site was made in the mid axillary line at the 7th  intercostal space and the chest tube was inserted and sutured into place  with 0 silk and through the third trocar site, the other chest tube was  inserted and sutured in place with 0 silk.  A Marcaine block was done in the  usual fashion.  A tunneler was placed in the paravertebral space starting at  the 8th intercostal space and resecting superiorly and through that was  placed two On-Q catheters and the tunneler and the sheath were removed.  They  were sutured into place with 2-0 silk.  A Marcaine block was  done in the  usual fashion.  The chest was closed with four pericostals, #1 Vicryl on the  muscle layer, 2-0 Vicryl on the subcutaneous tissues, and Dermabond for the  skin.  The patient was returned to the recovery room in stable condition.      DPB/MEDQ  D:  07/14/2004  T:  07/14/2004  Job:  [id number]   cc:   Danice Goltz, M.D. [facility]

## 2010-08-11 NOTE — Procedures (Signed)
[place]. [facility]  Patient:    Wagner, Carmen                      MRN: [id number] Proc. Date: 03/08/00 Adm. Date:  [id number] Attending:  Durel Salts                           Procedure Report  This is a 60 year old with paresthesias, questionable findings on MRI scan, possible multiple sclerosis.  PROCEDURE PERFORMED:  Lumbar puncture.  INDICATION:  Rule out MS.  The procedure was explained with the risks of possible headache and benefits possibly able to make a diagnosis and a consent was obtained.  DESCRIPTION OF PROCEDURE:  The patient was positioned in the left lateral decubitus position, sterilely prepped and draped and locally anesthetized with 1% xylocaine at the L3-4 level.  Spinal needle was introduced without difficulty.  Opening pressure was 23.  Approximately 10 cc of clear colorless fluid was obtained and sent for studies.  The patient did tolerate the procedure well.  She was placed in Trendelenburg for 30 minutes and then will be all right to discharge home.  She was instructed to lie flat at home and drink plenty of fluids to try to avoid headache.  Spinal fluid was sent for glucose, protein, VDRL, cell count and differential, oligoclonal bands, IgG, albumin index, Lyme titer and bacterial infectious studies including Gram stain, bacterial culture and Cryptococcal antigen. DD:  03/08/00 TD:  03/08/00 Job: [id number]

## 2010-08-11 NOTE — Discharge Summary (Signed)
[name], [name]               ACCOUNT NO.:  [id number]   MEDICAL RECORD NO.:  [id number]          PATIENT TYPE:  INP   LOCATION:  1312                         [facility]ACILITY[facility]  [facility]   PHYSICIAN:  Lajuana Matte, MD  DATE OF BIRTH:  20-Sep-1950   DATE OF ADMISSION:  09/18/2004  DATE OF DISCHARGE:  09/20/2004                                 DISCHARGE SUMMARY   REASON FOR ADMISSION:  [age]year-old white female with history of non-  small-cell lung cancer on adjuvant chemotherapy presenting with neutropenic  fever.   HISTORY:  [name] is a very pleasant 60 year old white female who was  diagnosed with stage IIA (T1, N1, M0) non-small-cell lung cancer in March  2006 status post left thoracotomy with left upper trisegmentectomy with node  dissection on July 14, 2004 under the care of Dr. Edwyna Shell. The patient also  is status post two cycles of adjuvant chemotherapy with carboplatin and  paclitaxel, last dose was given on September 12, 2004. On the day of admission  the patient presented with fever of 102 degrees. She was also complaining of  chills, fatigue, generalized and joint aches, but the patient denied having  any cough or shortness of breath. She came to the emergency department for  evaluation and CBC on the day of admission showed white blood count of 0.9  with absolute neutrophil count of 0. Her hemoglobin was  low at 10.9,  hematocrit 31.8. The patient was admitted for further evaluation and  treatment of neutropenic fever. On admission her blood pressure was 106/68,  pulse 125, respiratory rate 20, temperature 101.3, oxygen saturation 98%.   HOSPITAL COURSE:  The patient was admitted to the oncology service for  neutropenic fever. She was started on neutropenic precautions and diet. She  had blood cultures x2 that showed no growth until the day of discharge. She  also had urinalysis and culture sensitivity that was unremarkable. The  patient also had chest x-ray  performed that was unremarkable except for  hyperinflation with stable ill-defined opacity in the right upper lobe. The  patient was started empirically on cefepime 2 g q.12h. She was also started  on Neupogen 300 mcg subcutaneously on June 26 and June 28. Her total white  blood count improved over the period of her hospitalization and on the day  of discharge total white blood count was 4.7 with absolute neutrophil count  of 2100. At that point I discontinued cefepime and also discontinued  vancomycin that was started on the previous day as well as Neupogen and the  patient was discharged home in stable condition. Of note that she did not  have any significant fever on the previous 24 hours before her discharge.  During this admission the patient continued on her pain medication in the  form of Neurontin 200 mg p.o. t.i.d. as well as OxyContin 5 mg p.o. q.4h. as  needed for pain. She was also placed on GI prophylaxis with Protonix 40 mg  p.o. once daily as well as DVT prophylaxis with sequential compression  devices. Discharge condition improved.   DISCHARGE DIAGNOSES:  1.  Stage IIA non-small-cell lung cancer.  2.  Neutropenic fever.  3.  Chronic sinusitis.   DISCHARGE MEDICATIONS:  1.  Levaquin 500 mg p.o. once daily x4 days.  2.  Protonix 40 mg p.o. once daily.  3.  Neurontin 200 mg p.o. t.i.d.  4.  OxyContin 5 mg p.o. q.4h. p.r.n. pain.   Discharge follow-up with Dr. Arbutus Ped at the [facility] as  scheduled.   DISCHARGE DIET:  No restriction.   DISCHARGE ACTIVITY:  No restriction.       MKM/MEDQ  D:  09/21/2004  T:  09/21/2004  Job:  [id number]   cc:   Ines Bloomer, M.D.  [address]  [place]  [place] [address]

## 2010-08-11 NOTE — Op Note (Signed)
NAME:  [name], [name]                         ACCOUNT NO.:  [id number]   MEDICAL RECORD NO.:  [id number]                   PATIENT TYPE:  AMB   LOCATION:  DSC                                  FACILITY:  [facility]   PHYSICIAN:  Harvie Junior, M.D.                DATE OF BIRTH:  1951-03-11   DATE OF PROCEDURE:  07/17/2002  DATE OF DISCHARGE:  07/17/2002                                 OPERATIVE REPORT   PREOPERATIVE DIAGNOSIS:  Trigger finger right long and trigger finger right  ring.   POSTOPERATIVE DIAGNOSIS:  Trigger finger right long and trigger finger right  ring.   PROCEDURE:  Release of right long trigger finger, release of right ring  trigger finger.   SURGEON:  Harvie Junior, M.D.   ASSISTANT:  Marshia Ly, P.A.   ANESTHESIA:  Forearm-based IV regional.   INDICATIONS FOR PROCEDURE:  This is a 60 year old female with a long history  of having multiple trigger fingers.  We ultimately had tried conservative  care for her ring and long finger on the right hand failing conservative  care. She is ultimately taken to the operating room for trigger finger  release.   DESCRIPTION OF PROCEDURE:  The patient was taken to the operating room where  adequate anesthesia was obtained with a forearm-based IV regional.  The  patient was placed supine on the operating table. Right arm was prepped and  draped in the usual sterile fashion.  Following this, an incision was made  over the ring metacarpal. The subcutaneous tissue was divided well over the  A2 pulley which was clearly identified and divided along the length of the  fibers.  The two flexor tendons were then identified, grasped, pulled into  the wound, and allowed easy full flexion of the finger without any tendency  toward locking or catching at this point.  Attention was then turned to the  long finger where a linear incision was made in the subcutaneous tissue and  taken down to the level of the extensor mechanism. The A1  pulley was  identified and clearly divided along the length of its fibers. The tendons  were then grasped and pulled into the wound. No tendency toward catching or  locking of the finger at this point.  At this point, the wound was copiously  irrigated and suctioned dry and closed with a nylon interrupted suture. A  sterile compressive dressing was applied. The patient was taken to the  recovery room where she was noted to be in satisfactory condition.  Estimated blood loss for the procedure was none.                                               Harvie Junior, M.D.    Ranae Plumber  D:  09/09/2002  T:  09/10/2002  Job:  [id number]

## 2011-01-01 ENCOUNTER — Other Ambulatory Visit: Payer: Self-pay | Admitting: Family Medicine

## 2011-01-01 DIAGNOSIS — Z1231 Encounter for screening mammogram for malignant neoplasm of breast: Secondary | ICD-10-CM

## 2011-01-05 LAB — CREATININE, SERUM
GFR calc Af Amer: >60
GFR calc non Af Amer: >60

## 2011-01-05 LAB — BUN: BUN: 13

## 2011-01-09 ENCOUNTER — Ambulatory Visit
Admission: RE | Admit: 2011-01-09 | Discharge: 2011-01-09 | Disposition: A | Discharge status: Home / Self Care | Payer: Managed Care, Other (non HMO) | Source: Ambulatory Visit | Attending: Family Medicine | Admitting: Family Medicine

## 2011-01-09 DIAGNOSIS — Z1231 Encounter for screening mammogram for malignant neoplasm of breast: Secondary | ICD-10-CM

## 2011-01-09 IMAGING — MG MM DIGITAL SCREENING BILAT W/ CAD
4 series · 4 of 4 positions shown · non-contrast
Comparison: none

DG SCREEN MAMMOGRAM BILATERAL
Bilateral CC and MLO view(s) were taken.

DIGITAL SCREENING MAMMOGRAM WITH CAD:
The breast tissue is heterogeneously dense.  Microcalcifications and possible asymmetry are present
in the right breast.  Characterization with magnification views is recommended.  No mass or 
malignant type calcifications are identified in the left breast.  Compared with prior studies.
Images were processed with CAD.

[R CC]
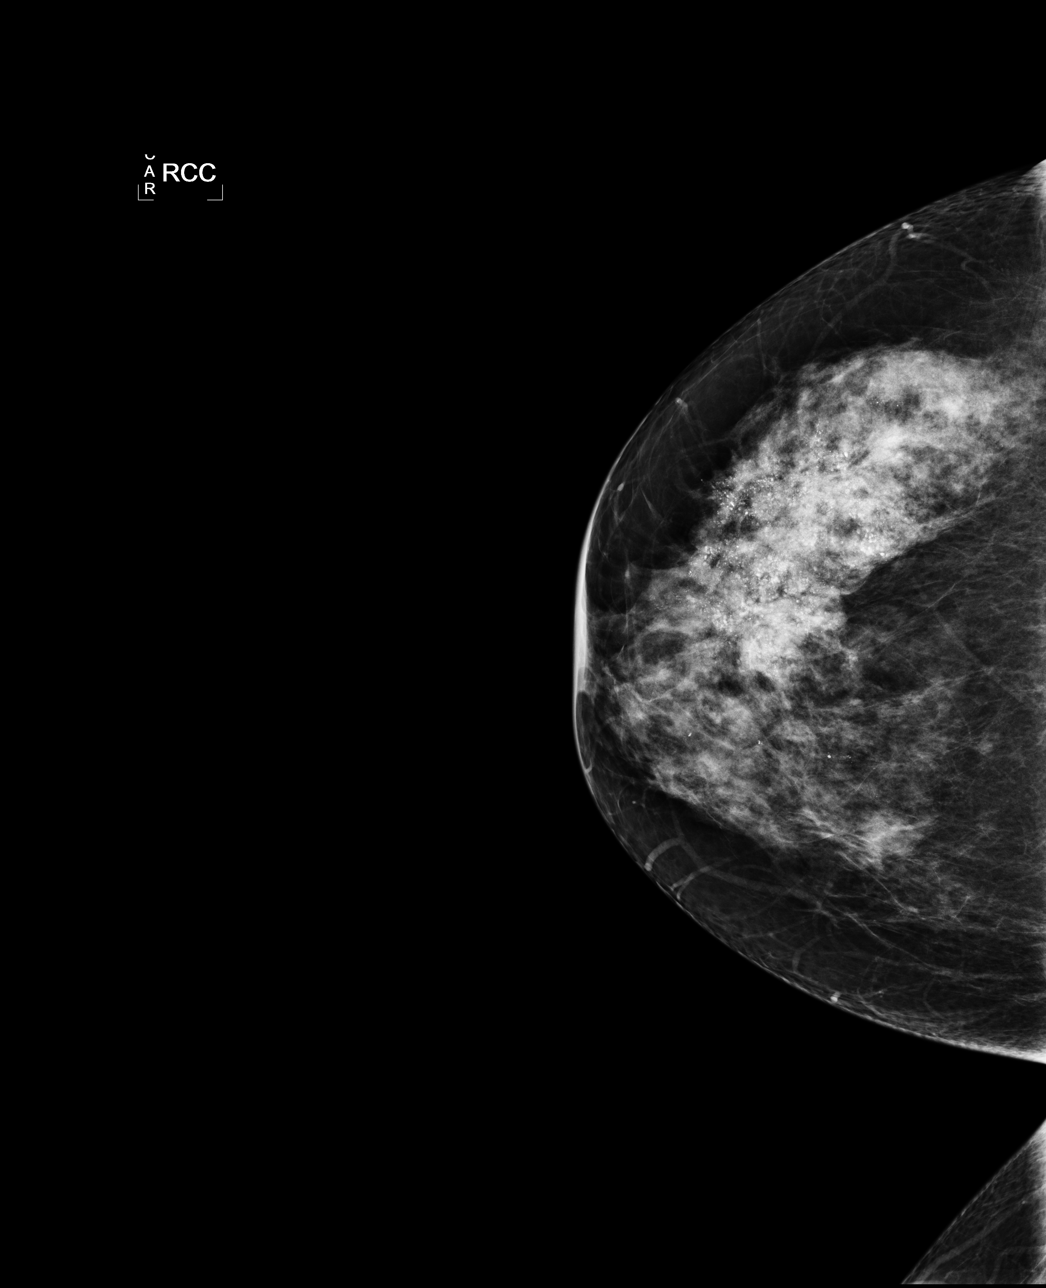

[L CC]
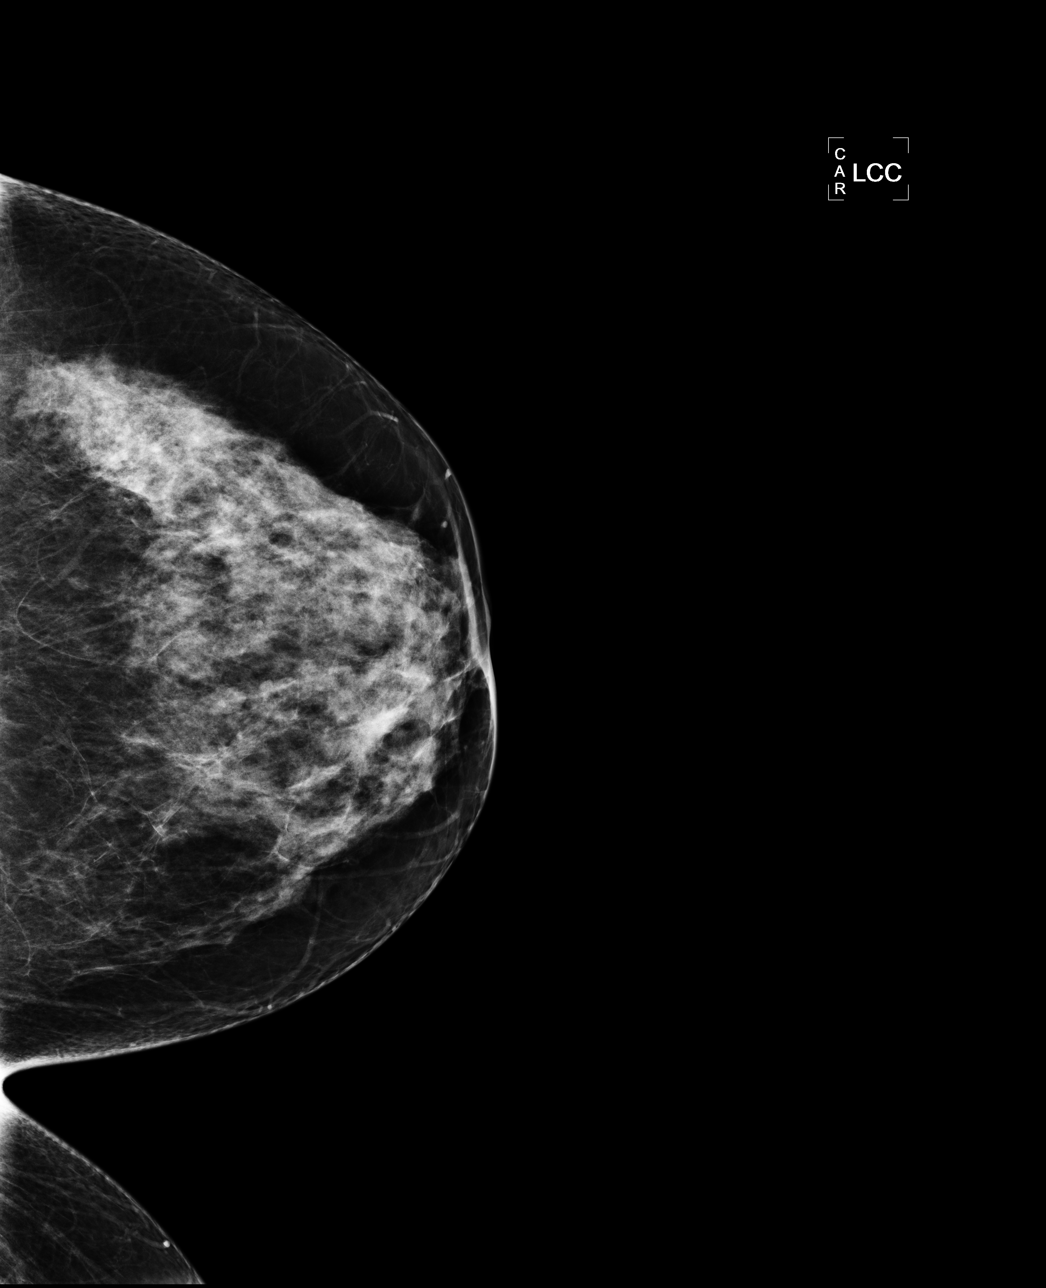

[L MLO]
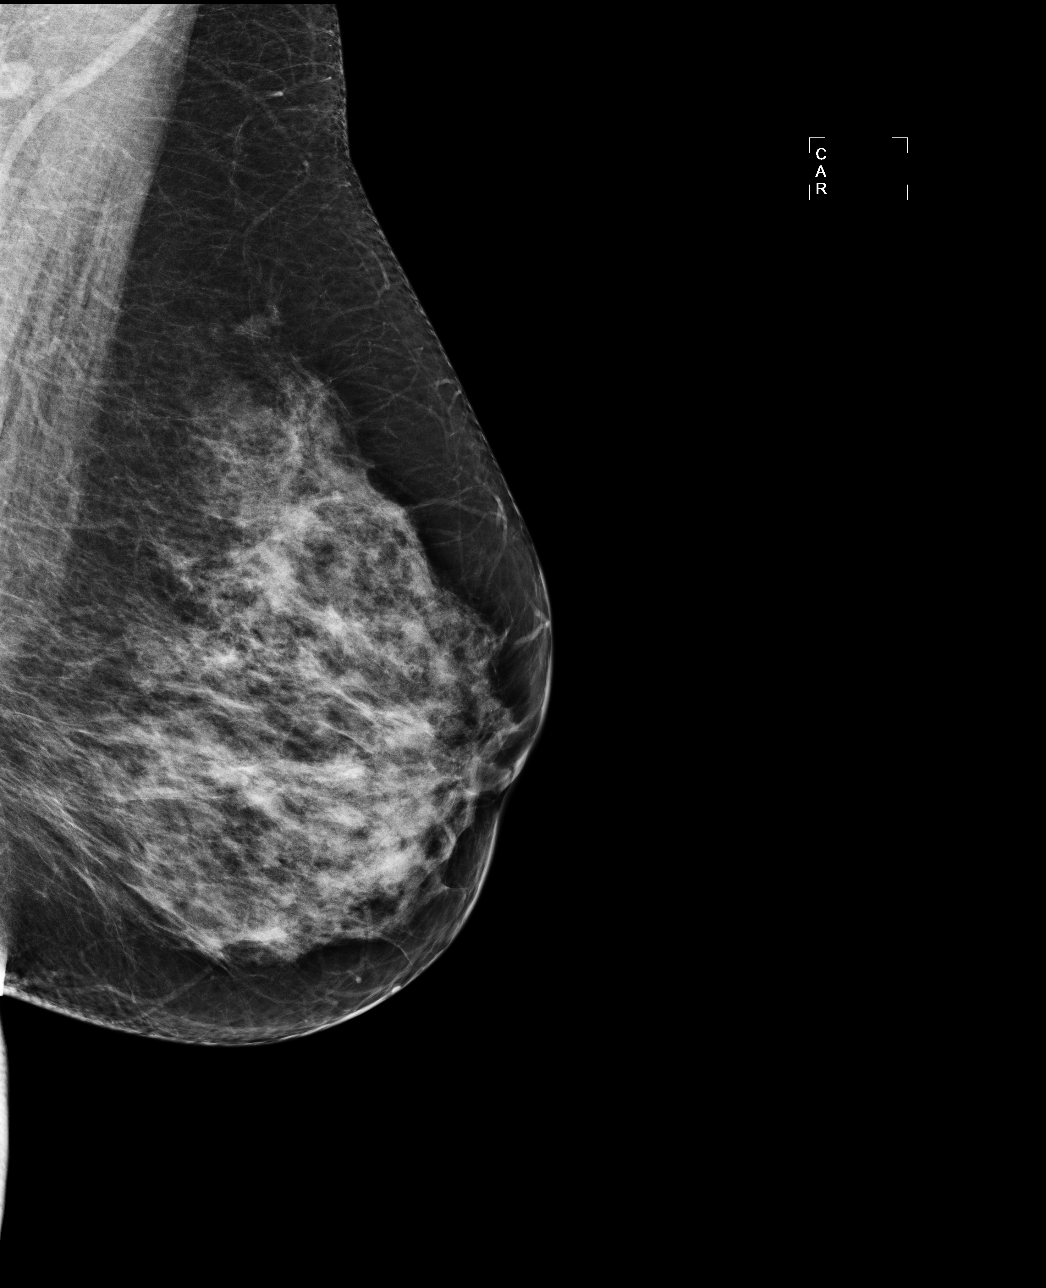

[R MLO]
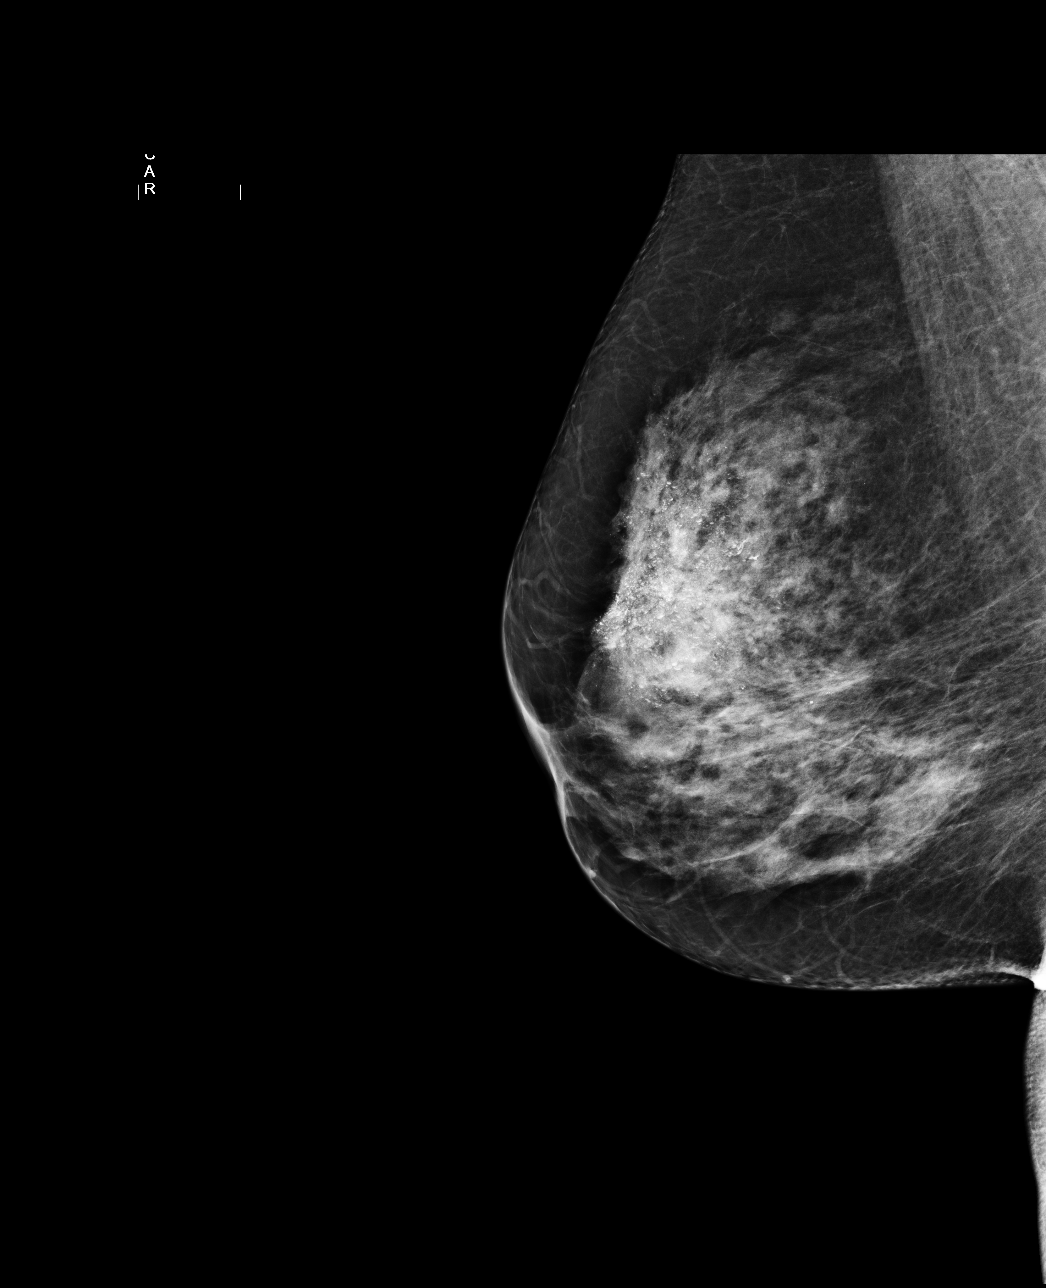

[4 of 4 positions shown; findings below may reference images not displayed]

IMPRESSION: Calcifications and possible asymmetry, right breast.  Additional evaluation is indicated. The 
patient will be contacted for additional studies and a supplementary report will follow.  No 
specific mammographic evidence of malignancy, left breast.

ASSESSMENT: Need additional imaging evaluation and/or prior mammograms for comparison - BI-RADS 0

Further imaging of the right breast.
,

## 2011-01-11 ENCOUNTER — Other Ambulatory Visit: Payer: Self-pay | Admitting: Family Medicine

## 2011-01-11 DIAGNOSIS — R928 Other abnormal and inconclusive findings on diagnostic imaging of breast: Secondary | ICD-10-CM

## 2011-01-26 ENCOUNTER — Ambulatory Visit
Admission: RE | Admit: 2011-01-26 | Discharge: 2011-01-26 | Disposition: A | Discharge status: Home / Self Care | Payer: Managed Care, Other (non HMO) | Source: Ambulatory Visit | Attending: Family Medicine | Admitting: Family Medicine

## 2011-01-26 DIAGNOSIS — R928 Other abnormal and inconclusive findings on diagnostic imaging of breast: Secondary | ICD-10-CM

## 2011-01-26 IMAGING — MG MM DIGITAL DIAGNOSTIC LIMITED*R*
4 series · 4 of 4 positions shown · non-contrast
Comparison: [DATE], [DATE], [DATE] and [DATE],
[DATE]

CLINICAL DATA: Asymmetry and calcifications in the right breast
identified on recent screening mammogram.

DIGITAL DIAGNOSTIC RIGHT MAMMOGRAM WITHOUT CAD AND RIGHT BREAST
ULTRASOUND:

[R CC (1 of 2)]
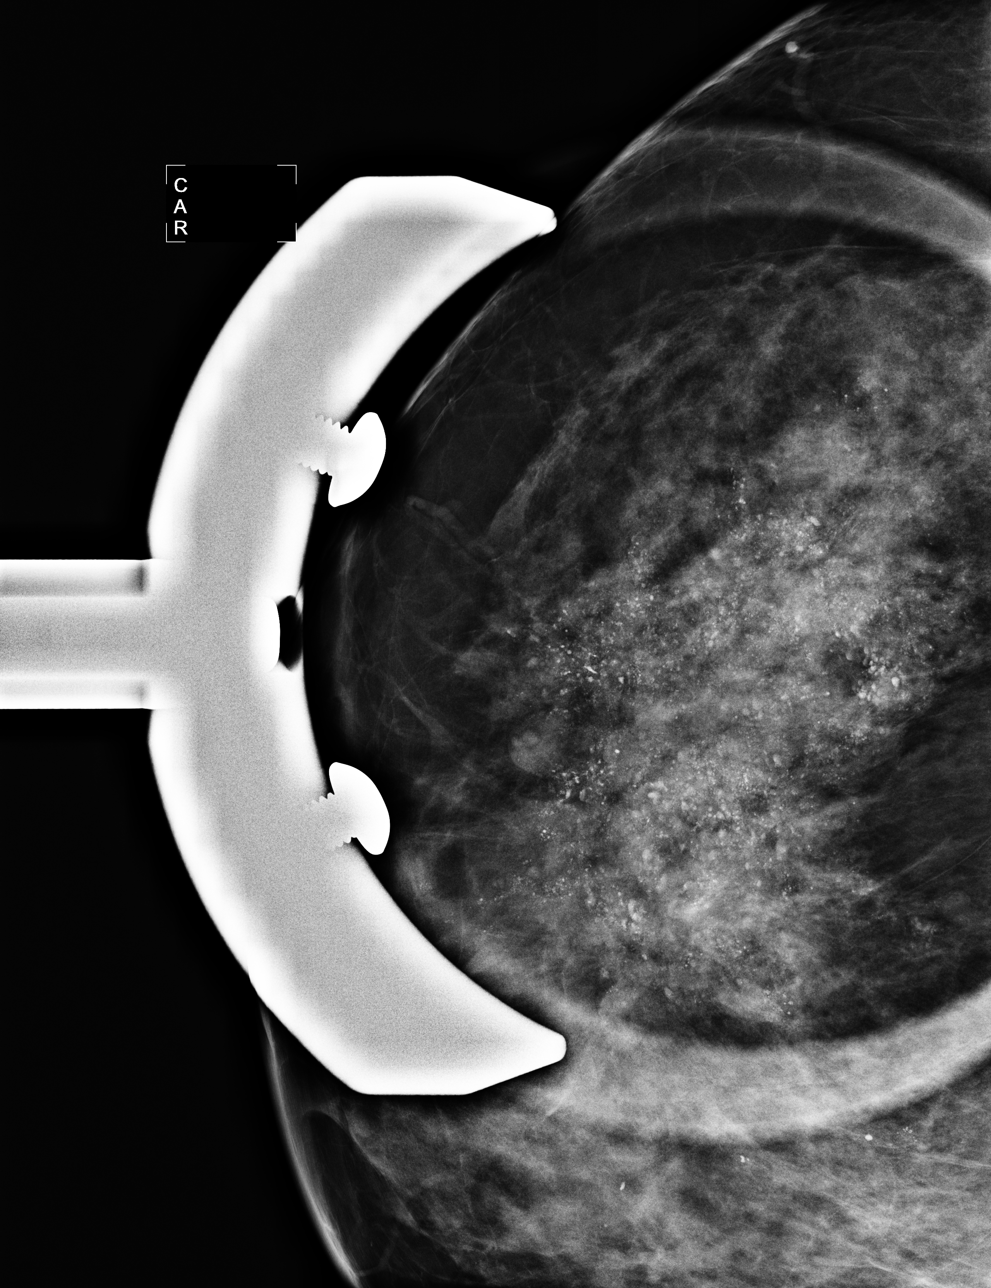

[R ML (1 of 2)]
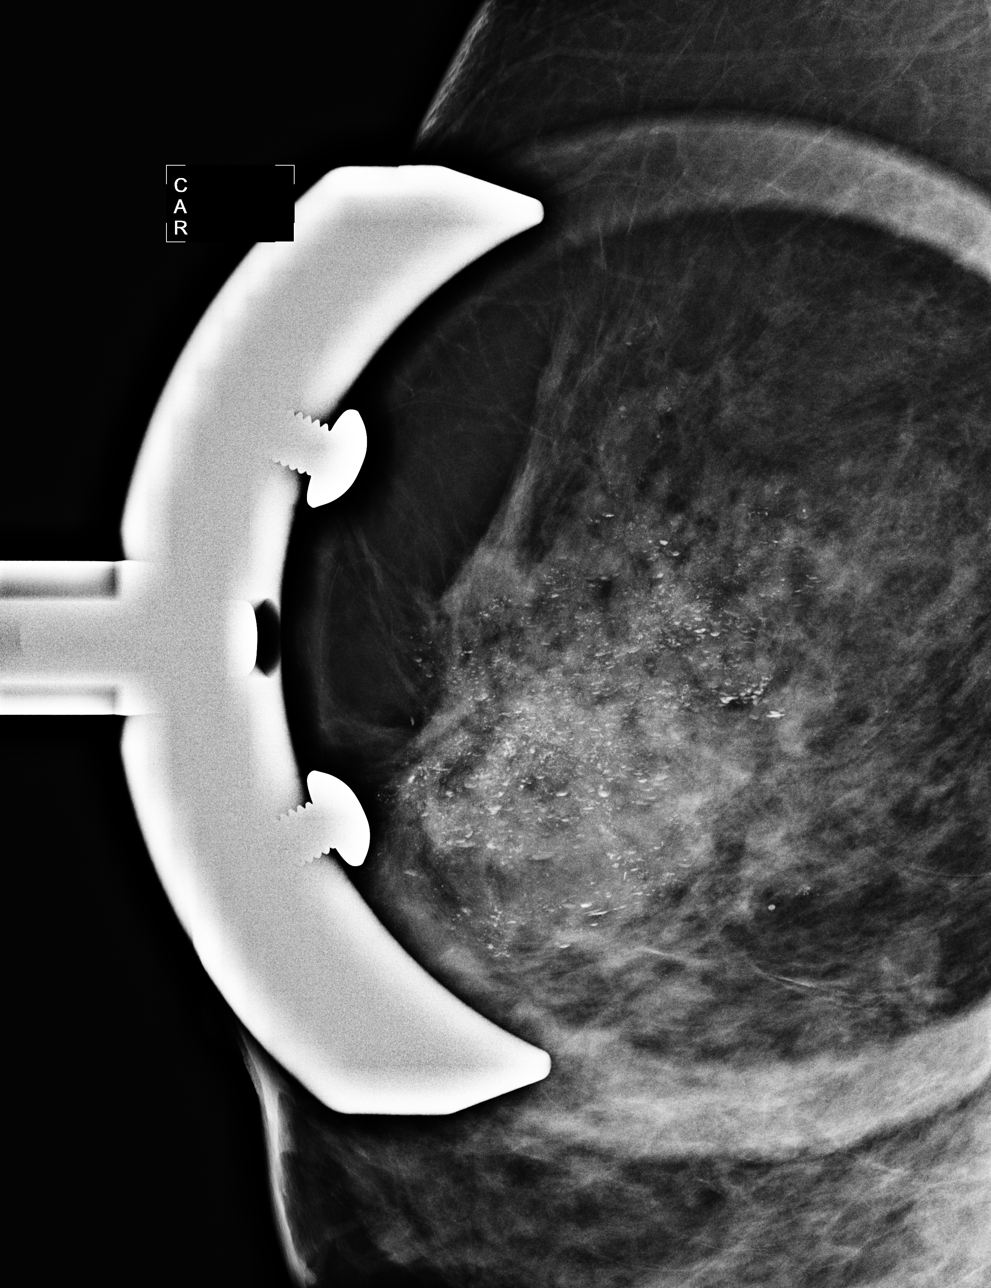

[R ML (2 of 2)]
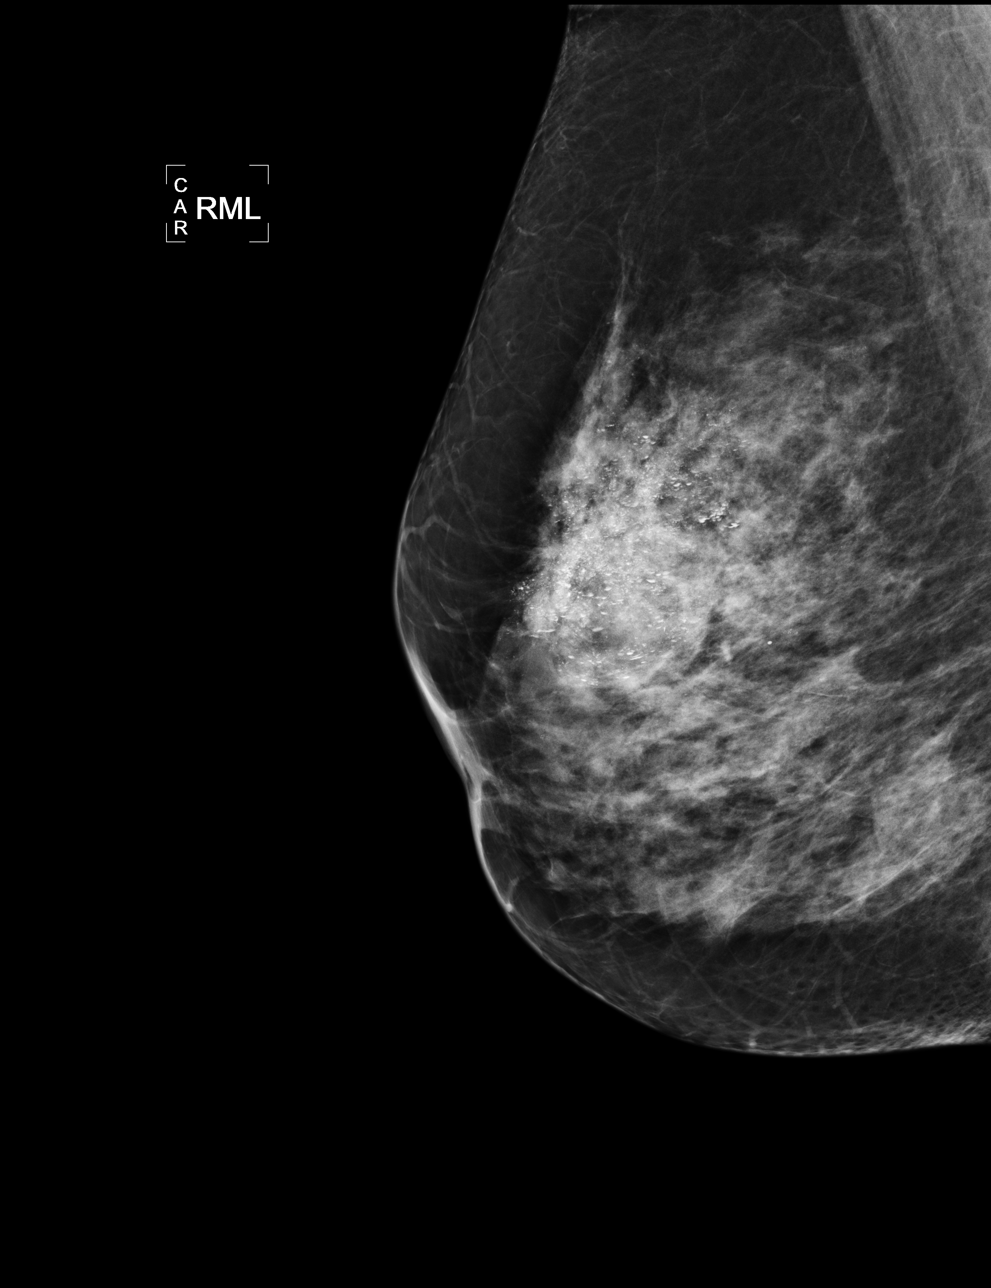

[R CC (2 of 2)]
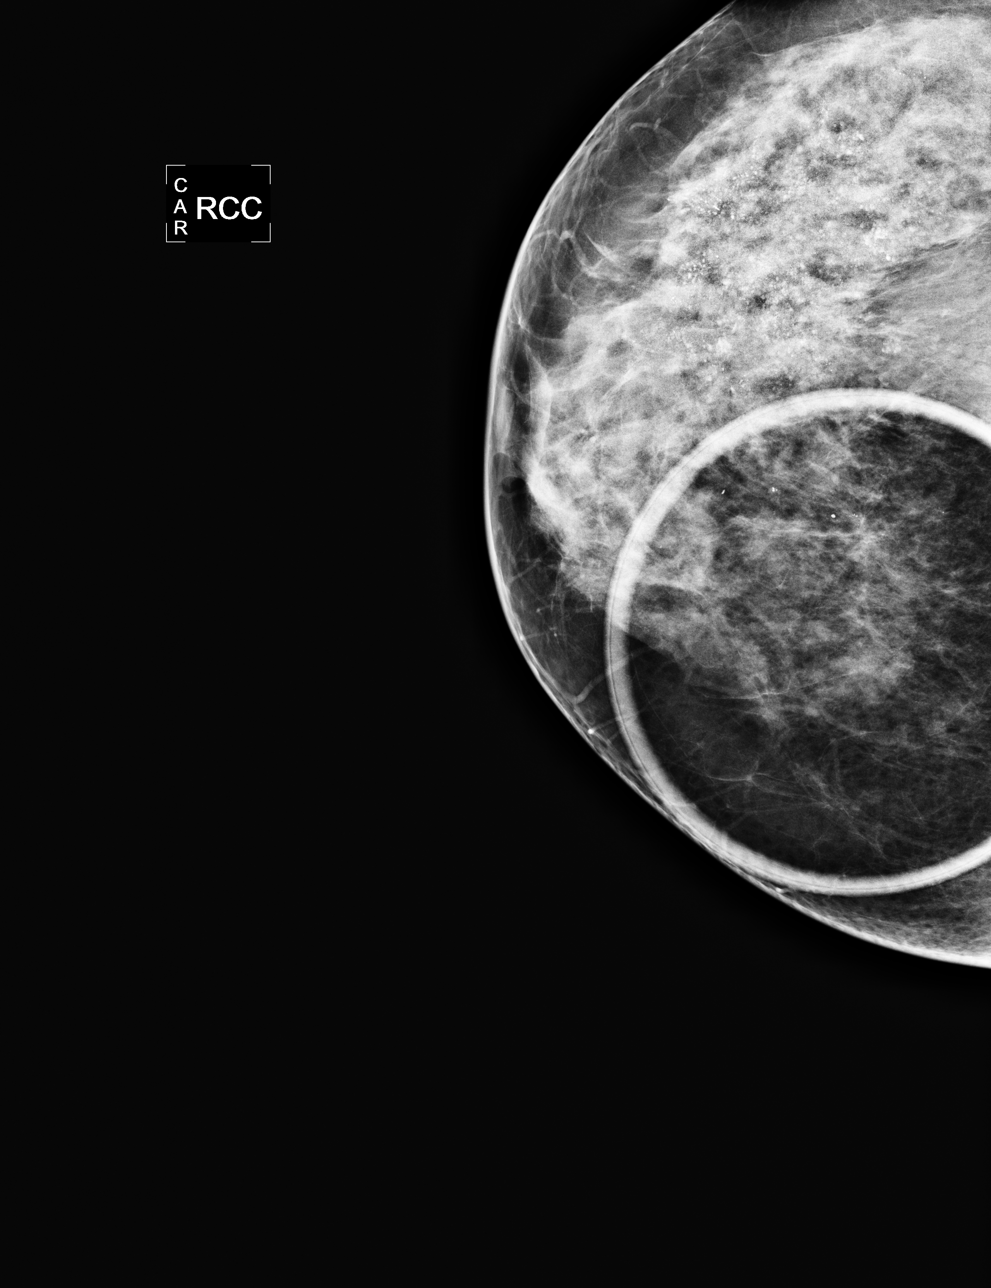

[4 of 4 positions shown; findings below may reference images not displayed]

FINDINGS: Magnification views of the upper outer quadrant of the
right breast show innumerable microcalcifications, which are smudgy
and amorphous in the CC projection, and later in a teacup fashion
on the 90 degrees magnification view.  These are consistent with
benign milk of calcium, that have been present since [MB].  No
suspicious microcalcifications are seen within the right breast.

Focal spot compression view of the medial right breast shows no
persistent asymmetry distortion.  The breast parenchyma is
heterogeneously dense.

Mammographic images were processed with CAD.

On physical exam, there is lumpiness in the upper outer quadrant of
the right breast without a discrete focal mass.  No mass or
thickening is felt in the medial right breast.

The medial right breast is negative.

Ultrasound is performed, showing in the upper outer quadrant of the
right breast are scattered cysts and microcysts, which contain
internal echogenic foci consistent with microcalcifications seen on
mammography.
IMPRESSION: No evidence of malignancy in the right breast.  Extensive
fibrocystic changes with benign milk of calcium in the upper outer
quadrant of the right breast.  Bilateral screening mammogram in 1
year is recommended.

BI-RADS CATEGORY 2:  Benign finding(s).

## 2011-01-26 IMAGING — US US BREAST*R*
1 series · 3 of 3 positions shown · non-contrast
Comparison: [DATE], [DATE], [DATE] and [DATE],
[DATE]

CLINICAL DATA: Asymmetry and calcifications in the right breast
identified on recent screening mammogram.

DIGITAL DIAGNOSTIC RIGHT MAMMOGRAM WITHOUT CAD AND RIGHT BREAST
ULTRASOUND:

[Series 1: us breast*right* · 3 of 3 slices shown]
[im 1/3]
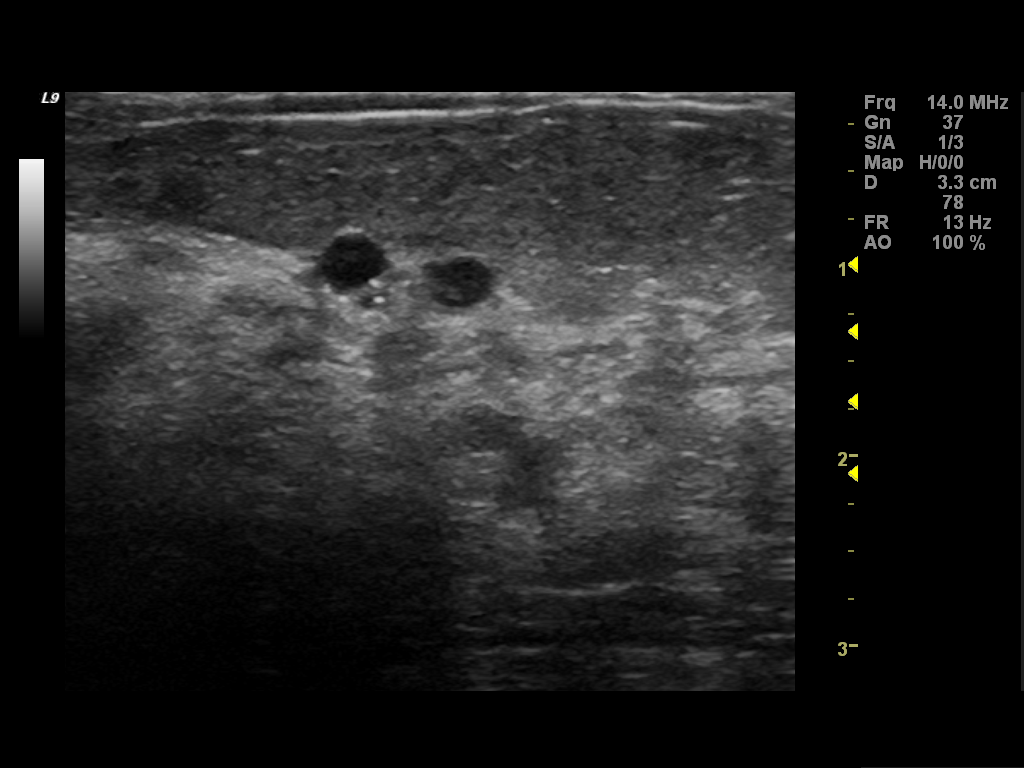
[im 2/3]
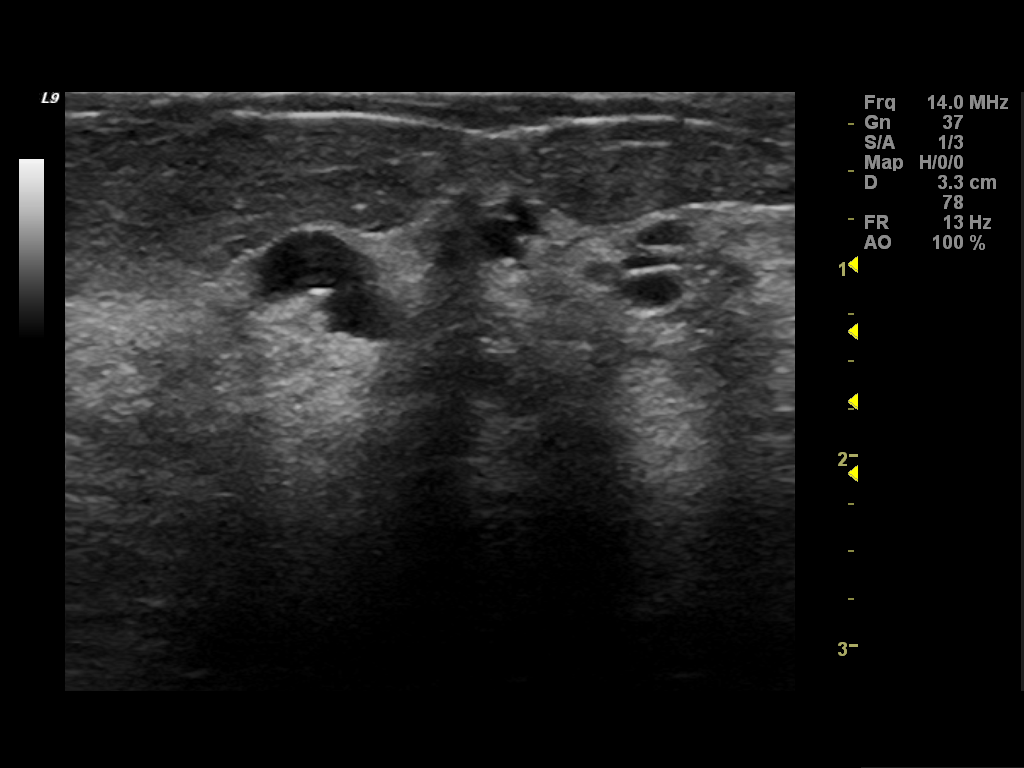
[im 3/3]
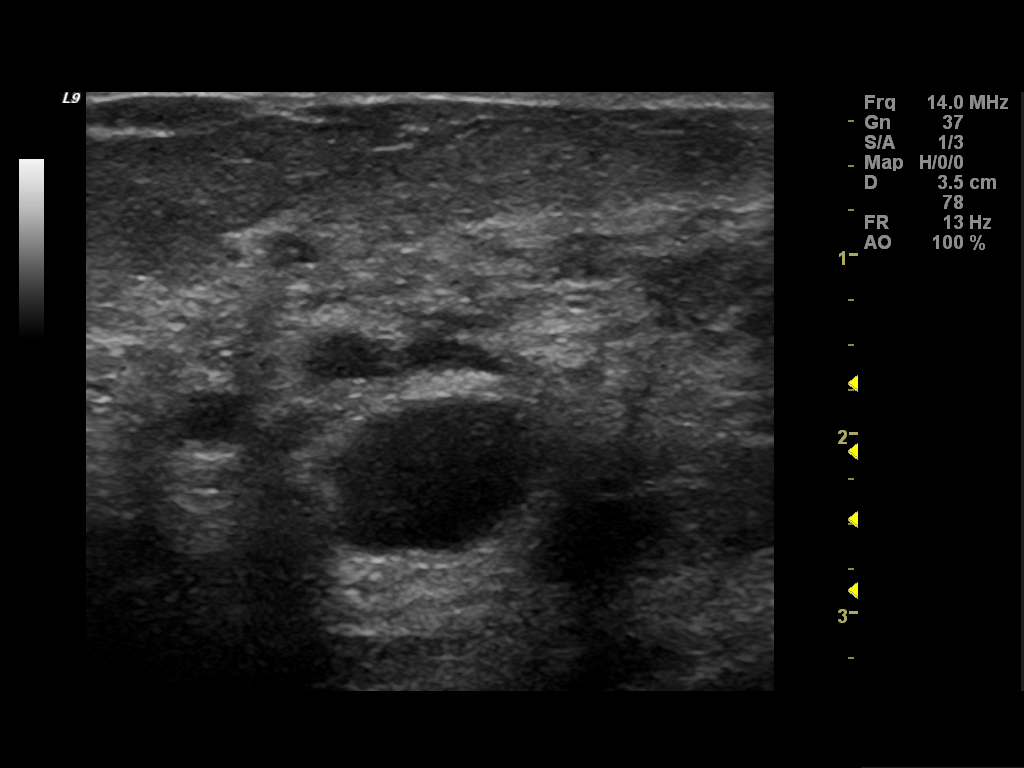

[3 of 3 positions shown; findings below may reference images not displayed]

FINDINGS: Magnification views of the upper outer quadrant of the
right breast show innumerable microcalcifications, which are smudgy
and amorphous in the CC projection, and later in a teacup fashion
on the 90 degrees magnification view.  These are consistent with
benign milk of calcium, that have been present since [MB].  No
suspicious microcalcifications are seen within the right breast.

Focal spot compression view of the medial right breast shows no
persistent asymmetry distortion.  The breast parenchyma is
heterogeneously dense.

Mammographic images were processed with CAD.

On physical exam, there is lumpiness in the upper outer quadrant of
the right breast without a discrete focal mass.  No mass or
thickening is felt in the medial right breast.

The medial right breast is negative.

Ultrasound is performed, showing in the upper outer quadrant of the
right breast are scattered cysts and microcysts, which contain
internal echogenic foci consistent with microcalcifications seen on
mammography.
IMPRESSION: No evidence of malignancy in the right breast.  Extensive
fibrocystic changes with benign milk of calcium in the upper outer
quadrant of the right breast.  Bilateral screening mammogram in 1
year is recommended.

BI-RADS CATEGORY 2:  Benign finding(s).

## 2012-01-17 ENCOUNTER — Other Ambulatory Visit: Payer: Self-pay | Admitting: Family Medicine

## 2012-01-17 DIAGNOSIS — Z1231 Encounter for screening mammogram for malignant neoplasm of breast: Secondary | ICD-10-CM

## 2012-02-06 ENCOUNTER — Ambulatory Visit
Admission: RE | Admit: 2012-02-06 | Discharge: 2012-02-06 | Disposition: A | Discharge status: Home / Self Care | Payer: Managed Care, Other (non HMO) | Source: Ambulatory Visit | Attending: Family Medicine | Admitting: Family Medicine

## 2012-02-06 DIAGNOSIS — Z1231 Encounter for screening mammogram for malignant neoplasm of breast: Secondary | ICD-10-CM

## 2012-02-15 ENCOUNTER — Other Ambulatory Visit: Payer: Self-pay | Admitting: Family Medicine

## 2012-02-15 DIAGNOSIS — R928 Other abnormal and inconclusive findings on diagnostic imaging of breast: Secondary | ICD-10-CM

## 2012-02-22 ENCOUNTER — Ambulatory Visit
Admission: RE | Admit: 2012-02-22 | Discharge: 2012-02-22 | Disposition: A | Discharge status: Home / Self Care | Payer: Managed Care, Other (non HMO) | Source: Ambulatory Visit | Attending: Family Medicine | Admitting: Family Medicine

## 2012-02-22 ENCOUNTER — Other Ambulatory Visit: Payer: Self-pay | Admitting: Family Medicine

## 2012-02-22 DIAGNOSIS — R928 Other abnormal and inconclusive findings on diagnostic imaging of breast: Secondary | ICD-10-CM

## 2012-03-06 ENCOUNTER — Ambulatory Visit
Admission: RE | Admit: 2012-03-06 | Discharge: 2012-03-06 | Disposition: A | Discharge status: Home / Self Care | Payer: Managed Care, Other (non HMO) | Source: Ambulatory Visit | Attending: Family Medicine | Admitting: Family Medicine

## 2012-03-06 ENCOUNTER — Other Ambulatory Visit: Payer: Self-pay | Admitting: Family Medicine

## 2012-03-06 ENCOUNTER — Other Ambulatory Visit: Payer: Self-pay | Admitting: Diagnostic Radiology

## 2012-03-06 DIAGNOSIS — R928 Other abnormal and inconclusive findings on diagnostic imaging of breast: Secondary | ICD-10-CM

## 2012-03-14 ENCOUNTER — Ambulatory Visit (INDEPENDENT_AMBULATORY_CARE_PROVIDER_SITE_OTHER): Payer: Managed Care, Other (non HMO) | Admitting: Surgery

## 2012-03-14 ENCOUNTER — Encounter (INDEPENDENT_AMBULATORY_CARE_PROVIDER_SITE_OTHER): Payer: Self-pay | Admitting: Surgery

## 2012-03-14 ENCOUNTER — Other Ambulatory Visit (INDEPENDENT_AMBULATORY_CARE_PROVIDER_SITE_OTHER): Payer: Self-pay | Admitting: Surgery

## 2012-03-14 VITALS — BP 120/78 | HR 74 | Temp 97.4°F | Resp 14 | Ht 64.0 in | Wt 161.0 lb

## 2012-03-14 DIAGNOSIS — N6099 Unspecified benign mammary dysplasia of unspecified breast: Secondary | ICD-10-CM

## 2012-03-14 DIAGNOSIS — N6089 Other benign mammary dysplasias of unspecified breast: Secondary | ICD-10-CM

## 2012-03-14 NOTE — Progress Notes (Signed)
Patient ID: [name], female   DOB: 1950-11-06, 61 y.o.   MRN: [id number]  No chief complaint on file.   HPI [name] is a 61 y.o. female.  Patient sent at the request of Dr.Hu of the [facility] do to a right breast mammographic abnormality. This was visualized by mammography and ultrasound and felt to be atypical. Core biopsy showed atypical ductal hyperplasia with sclerosing lesion right breast at 11:00. Patient has had dense breasts throughout her life. She had a sister with breast cancer. She had lung cancer 8 years ago and is disease free. She has no complaints of breast pain, nipple discharge or breast mass. HPI  Past Medical History  Diagnosis Date  . Anemia   . Arthritis   . Cancer     lung  . Thyroid disease     Past Surgical History  Procedure Date  . Lung removal, partial   . Appendectomy   . Wisdom tooth extraction   . Finger surgery   . Nasal sinus surgery     Family History  Problem Relation Age of Onset  . Breast cancer Sister   . Breast cancer Maternal Aunt   . Stomach cancer    . Leukemia Father     Social History History  Substance Use Topics  . Smoking status: Former Smoker    Quit date: 03/14/2005  . Smokeless tobacco: Not on file  . Alcohol Use: Yes    Allergies  Allergen Reactions  . Asa (Aspirin)     Current Outpatient Prescriptions  Medication Sig Dispense Refill  . cycloSPORINE (RESTASIS) 0.05 % ophthalmic emulsion 1 drop 2 (two) times daily.      . fluticasone (FLONASE) 50 MCG/ACT nasal spray       . Multiple Vitamin (MULTIVITAMIN) tablet Take 1 tablet by mouth daily.      Marland Kitchen PATANOL 0.1 % ophthalmic solution       . Polyethyl Glycol-Propyl Glycol (SYSTANE OP) Apply to eye.        Review of Systems Review of Systems  Constitutional: Negative.   HENT: Negative.   Eyes: Negative.   Respiratory: Negative.   Cardiovascular: Negative.   Gastrointestinal: Negative.   Genitourinary: Negative.     Musculoskeletal: Negative.   Neurological: Negative.   Hematological: Negative.   Psychiatric/Behavioral: Negative.     Blood pressure 120/78, pulse 74, temperature 97.4 F (36.3 C), resp. rate 14, height 5\' 4"  (1.626 m), weight 161 lb (73.029 kg).  Physical Exam Physical Exam  Constitutional: She is oriented to person, place, and time. She appears well-developed and well-nourished.  HENT:  Head: Normocephalic and atraumatic.  Eyes: EOM are normal. Pupils are equal, round, and reactive to light.  Neck: Normal range of motion. Neck supple.  Cardiovascular: Normal rate and regular rhythm.   Pulmonary/Chest: Effort normal and breath sounds normal. Right breast exhibits tenderness. Right breast exhibits no inverted nipple, no mass and no nipple discharge. Left breast exhibits no inverted nipple, no mass, no nipple discharge, no skin change and no tenderness. Breasts are symmetrical.    Abdominal: Soft. Bowel sounds are normal.  Musculoskeletal: Normal range of motion.  Neurological: She is alert and oriented to person, place, and time. She has normal reflexes.  Skin: Skin is warm and dry.  Psychiatric: She has a normal mood and affect. Her behavior is normal. Judgment and thought content normal.    Data Reviewed  Findings: Spot compression CC and MLO views of the right  breast  are submitted. There is persistent distortion identified within  the slightly upper slightly lateral right breast.  Ultrasound is performed, showing angulated hypoechoic lesion at the  right breast 11 o'clock position 1 cm from the nipple measuring  1.25 x 0.87 x 0.78 cm. This correlates to the mammographic  finding.  Assessment    RIGHT BREAST ADH  WITH SCLEROSING LESION    Plan    Recommend right breast needle localized partial mastectomy given small risk of malignancy in this setting. Patient would like to proceed.The procedure has been discussed with the patient. Alternatives to surgery have been  discussed with the patient.  Risks of surgery include bleeding,  Infection,  Seroma formation, death,  and the need for further surgery.   The patient understands and wishes to proceed.       Carreen Milius A. 03/14/2012, 10:29 AM

## 2012-03-14 NOTE — Patient Instructions (Signed)
Lumpectomy, Breast Conserving Surgery A lumpectomy is breast surgery that removes only part of the breast. Another name used may be partial mastectomy. The amount removed varies. Make sure you understand how much of your breast will be removed. Reasons for a lumpectomy:  Any solid breast mass.  Grouped significant nodularity that may be confused with a solitary breast mass. Lumpectomy is the most common form of breast cancer surgery today. The surgeon removes the portion of your breast which contains the tumor (cancer). This is the lump. Some normal tissue around the lump is also removed to be sure that all the tumor has been removed.  If cancer cells are found in the margins where the breast tissue was removed, your surgeon will do more surgery to remove the remaining cancer tissue. This is called re-excision surgery. Radiation and/or chemotherapy treatments are often given following a lumpectomy to kill any cancer cells that could possibly remain.  REASONS YOU MAY NOT BE ABLE TO HAVE BREAST CONSERVING SURGERY:  The tumor is located in more than one place.  Your breast is small and the tumor is large so the breast would be disfigured.  The entire tumor removal is not successful with a lumpectomy.  You cannot commit to a full course of chemotherapy, radiation therapy or are pregnant and cannot have radiation.  You have previously had radiation to the breast to treat cancer. HOW A LUMPECTOMY IS PERFORMED If overnight nursing is not required following a biopsy, a lumpectomy can be performed as a same-day surgery. This can be done in a hospital, clinic, or surgical center. The anesthesia used will depend on your surgeon. They will discuss this with you. A general anesthetic keeps you sleeping through the procedure. LET YOUR CAREGIVERS KNOW ABOUT THE FOLLOWING:  Allergies  Medications taken including herbs, eye drops, over the counter medications, and creams.  Use of steroids (by mouth or  creams)  Previous problems with anesthetics or Novocaine.  Possibility of pregnancy, if this applies  History of blood clots (thrombophlebitis)  History of bleeding or blood problems.  Previous surgery  Other health problems BEFORE THE PROCEDURE You should be present one hour prior to your procedure unless directed otherwise.  AFTER THE PROCEDURE  After surgery, you will be taken to the recovery area where a nurse will watch and check your progress. Once you're awake, stable, and taking fluids well, barring other problems you will be allowed to go home.  Ice packs applied to your operative site may help with discomfort and keep the swelling down.  A small rubber drain may be placed in the breast for a couple of days to prevent a hematoma from developing in the breast.  A pressure dressing may be applied for 24 to 48 hours to prevent bleeding.  Keep the wound dry.  You may resume a normal diet and activities as directed. Avoid strenuous activities affecting the arm on the side of the biopsy site such as tennis, swimming, heavy lifting (more than 10 pounds) or pulling.  Bruising in the breast is normal following this procedure.  Wearing a bra - even to bed - may be more comfortable and also help keep the dressing on.  Change dressings as directed.  Only take over-the-counter or prescription medicines for pain, discomfort, or fever as directed by your caregiver. Call for your results as instructed by your surgeon. Remember it is your responsibility to get the results of your lumpectomy if your surgeon asked you to follow-up. Do not assume   everything is fine if you have not heard from your caregiver. SEEK MEDICAL CARE IF:   There is increased bleeding (more than a small spot) from the wound.  You notice redness, swelling, or increasing pain in the wound.  Pus is coming from wound.  An unexplained oral temperature above 102 F (38.9 C) develops.  You notice a foul smell  coming from the wound or dressing. SEEK IMMEDIATE MEDICAL CARE IF:   You develop a rash.  You have difficulty breathing.  You have any allergic problems. Document Released: 04/23/2006 Document Revised: 06/04/2011 Document Reviewed: 07/25/2006 Exit[employer] Patient Information 2013 [employer].  

## 2012-03-21 ENCOUNTER — Telehealth (INDEPENDENT_AMBULATORY_CARE_PROVIDER_SITE_OTHER): Payer: Self-pay | Admitting: General Surgery

## 2012-03-21 NOTE — Telephone Encounter (Signed)
Pt called to alert MD she forgot to list allergy to PCN.  She was recently on Amoxicillin and broke out in hives severely enough that she was given a steroid injection.  Allergy added to her Epic chart.

## 2012-03-26 DIAGNOSIS — Z923 Personal history of irradiation: Secondary | ICD-10-CM

## 2012-03-26 HISTORY — DX: Personal history of irradiation: Z92.3

## 2012-03-26 HISTORY — PX: BREAST LUMPECTOMY: SHX2

## 2012-04-04 ENCOUNTER — Encounter (HOSPITAL_BASED_OUTPATIENT_CLINIC_OR_DEPARTMENT_OTHER): Payer: Self-pay | Admitting: *Deleted

## 2012-04-04 NOTE — Progress Notes (Signed)
Dr [name] ordered cmet-pt states labs done today dr Earnest Conroy ck If needs labs-will come in

## 2012-04-08 ENCOUNTER — Encounter (HOSPITAL_BASED_OUTPATIENT_CLINIC_OR_DEPARTMENT_OTHER)
Admission: RE | Admit: 2012-04-08 | Discharge: 2012-04-08 | Disposition: A | Discharge status: Home / Self Care | Payer: Managed Care, Other (non HMO) | Source: Ambulatory Visit | Attending: Surgery | Admitting: Surgery

## 2012-04-08 LAB — COMPREHENSIVE METABOLIC PANEL
ALT: 16 U/L (ref 0–35)
Alkaline Phosphatase: 83 U/L (ref 39–117)
CO2: 26 mEq/L (ref 19–32)
Chloride: 103 mEq/L (ref 96–112)
GFR calc Af Amer: >90 mL/min (ref >90)
GFR calc non Af Amer: >90 mL/min (ref >90)
Glucose, Bld: 101 mg/dL — ABNORMAL HIGH (ref 70–99)
Potassium: 4.1 mEq/L (ref 3.5–5.1)
Sodium: 140 mEq/L (ref 135–145)
Total Bilirubin: 0.3 mg/dL (ref 0.3–1.2)
Total Protein: 6.8 g/dL (ref 6.0–8.3)

## 2012-04-10 ENCOUNTER — Ambulatory Visit (HOSPITAL_BASED_OUTPATIENT_CLINIC_OR_DEPARTMENT_OTHER)
Admission: RE | Admit: 2012-04-10 | Discharge: 2012-04-10 | Disposition: A | Discharge status: Home / Self Care | Payer: Managed Care, Other (non HMO) | Source: Ambulatory Visit | Attending: Surgery | Admitting: Surgery

## 2012-04-10 ENCOUNTER — Encounter (HOSPITAL_BASED_OUTPATIENT_CLINIC_OR_DEPARTMENT_OTHER): Payer: Self-pay | Admitting: Anesthesiology

## 2012-04-10 ENCOUNTER — Ambulatory Visit
Admission: RE | Admit: 2012-04-10 | Discharge: 2012-04-10 | Disposition: A | Discharge status: Home / Self Care | Payer: Managed Care, Other (non HMO) | Source: Ambulatory Visit | Attending: Surgery | Admitting: Surgery

## 2012-04-10 ENCOUNTER — Encounter (HOSPITAL_BASED_OUTPATIENT_CLINIC_OR_DEPARTMENT_OTHER): Payer: Self-pay

## 2012-04-10 ENCOUNTER — Encounter (HOSPITAL_BASED_OUTPATIENT_CLINIC_OR_DEPARTMENT_OTHER): Payer: Self-pay | Admitting: Surgery

## 2012-04-10 ENCOUNTER — Ambulatory Visit (HOSPITAL_BASED_OUTPATIENT_CLINIC_OR_DEPARTMENT_OTHER): Payer: Managed Care, Other (non HMO) | Admitting: Anesthesiology

## 2012-04-10 ENCOUNTER — Encounter (HOSPITAL_BASED_OUTPATIENT_CLINIC_OR_DEPARTMENT_OTHER)
Admission: RE | Disposition: A | Discharge status: Home / Self Care | Payer: Self-pay | Source: Ambulatory Visit | Attending: Surgery

## 2012-04-10 DIAGNOSIS — Z803 Family history of malignant neoplasm of breast: Secondary | ICD-10-CM | POA: Insufficient documentation

## 2012-04-10 DIAGNOSIS — Z886 Allergy status to analgesic agent status: Secondary | ICD-10-CM | POA: Insufficient documentation

## 2012-04-10 DIAGNOSIS — Z9089 Acquired absence of other organs: Secondary | ICD-10-CM | POA: Insufficient documentation

## 2012-04-10 DIAGNOSIS — N6099 Unspecified benign mammary dysplasia of unspecified breast: Secondary | ICD-10-CM

## 2012-04-10 DIAGNOSIS — D0590 Unspecified type of carcinoma in situ of unspecified breast: Secondary | ICD-10-CM | POA: Insufficient documentation

## 2012-04-10 DIAGNOSIS — Z87891 Personal history of nicotine dependence: Secondary | ICD-10-CM | POA: Insufficient documentation

## 2012-04-10 DIAGNOSIS — M129 Arthropathy, unspecified: Secondary | ICD-10-CM | POA: Insufficient documentation

## 2012-04-10 DIAGNOSIS — C50919 Malignant neoplasm of unspecified site of unspecified female breast: Secondary | ICD-10-CM | POA: Insufficient documentation

## 2012-04-10 DIAGNOSIS — E079 Disorder of thyroid, unspecified: Secondary | ICD-10-CM | POA: Insufficient documentation

## 2012-04-10 DIAGNOSIS — Z806 Family history of leukemia: Secondary | ICD-10-CM | POA: Insufficient documentation

## 2012-04-10 DIAGNOSIS — Z85118 Personal history of other malignant neoplasm of bronchus and lung: Secondary | ICD-10-CM | POA: Insufficient documentation

## 2012-04-10 HISTORY — PX: PARTIAL MASTECTOMY WITH NEEDLE LOCALIZATION: SHX6008

## 2012-04-10 LAB — POCT HEMOGLOBIN-HEMACUE: Hemoglobin: 14.3 g/dL (ref 12.0–15.0)

## 2012-04-10 SURGERY — PARTIAL MASTECTOMY WITH NEEDLE LOCALIZATION
Anesthesia: Monitor Anesthesia Care | Site: Breast | Laterality: Right | Wound class: Clean

## 2012-04-10 MED ORDER — DEXTROSE 5 % IV SOLN: 3.0000 g | INTRAVENOUS | Status: DC

## 2012-04-10 MED ORDER — HYDROMORPHONE HCL PF 1 MG/ML IJ SOLN: 0.2500 mg | INTRAMUSCULAR | Status: DC | PRN

## 2012-04-10 MED ORDER — LIDOCAINE HCL (CARDIAC) 20 MG/ML IV SOLN
INTRAVENOUS | Status: DC | PRN
  Administered 2012-04-10: 75 mg via INTRAVENOUS

## 2012-04-10 MED ORDER — LACTATED RINGERS IV SOLN
INTRAVENOUS | Status: DC
  Administered 2012-04-10: 11:00:00 via INTRAVENOUS

## 2012-04-10 MED ORDER — OXYCODONE HCL 5 MG PO TABS
5.0000 mg | ORAL_TABLET | Freq: Once | ORAL | Status: AC | PRN
  Administered 2012-04-10: 5 mg via ORAL

## 2012-04-10 MED ORDER — BUPIVACAINE-EPINEPHRINE 0.25% -1:200000 IJ SOLN
INTRAMUSCULAR | Status: DC | PRN
  Administered 2012-04-10: 15 mL

## 2012-04-10 MED ORDER — HYDROCODONE-ACETAMINOPHEN 5-325 MG PO TABS: 1.0000 | ORAL_TABLET | ORAL | Status: DC | PRN

## 2012-04-10 MED ORDER — PROPOFOL 10 MG/ML IV EMUL
INTRAVENOUS | Status: DC | PRN
  Administered 2012-04-10: 120 ug/kg/min via INTRAVENOUS

## 2012-04-10 MED ORDER — MIDAZOLAM HCL 5 MG/5ML IJ SOLN
INTRAMUSCULAR | Status: DC | PRN
  Administered 2012-04-10: 2 mg via INTRAVENOUS

## 2012-04-10 MED ORDER — FENTANYL CITRATE 0.05 MG/ML IJ SOLN
INTRAMUSCULAR | Status: DC | PRN
  Administered 2012-04-10: 50 ug via INTRAVENOUS
  Administered 2012-04-10: 100 ug via INTRAVENOUS
  Administered 2012-04-10: 50 ug via INTRAVENOUS

## 2012-04-10 MED ORDER — OXYCODONE HCL 5 MG/5ML PO SOLN: 5.0000 mg | Freq: Once | ORAL | Status: AC | PRN

## 2012-04-10 MED ORDER — CHLORHEXIDINE GLUCONATE 4 % EX LIQD: 1.0000 "application " | Freq: Once | CUTANEOUS | Status: DC

## 2012-04-10 MED ORDER — CIPROFLOXACIN IN D5W 400 MG/200ML IV SOLN
400.0000 mg | Freq: Once | INTRAVENOUS | Status: AC
  Administered 2012-04-10: 400 mg via INTRAVENOUS

## 2012-04-10 MED ORDER — ONDANSETRON HCL 4 MG/2ML IJ SOLN
INTRAMUSCULAR | Status: DC | PRN
  Administered 2012-04-10: 4 mg via INTRAVENOUS

## 2012-04-10 MED ORDER — ONDANSETRON HCL 4 MG/2ML IJ SOLN: 4.0000 mg | Freq: Once | INTRAMUSCULAR | Status: DC | PRN

## 2012-04-10 SURGICAL SUPPLY — 45 items
ADH SKN CLS APL DERMABOND .7 (GAUZE/BANDAGES/DRESSINGS) ×1
BLADE SURG 15 STRL LF DISP TIS (BLADE) ×1 IMPLANT
BLADE SURG 15 STRL SS (BLADE) ×2
CANISTER SUCTION 1200CC (MISCELLANEOUS) ×2 IMPLANT
CHLORAPREP W/TINT 26ML (MISCELLANEOUS) ×2 IMPLANT
CLIP TI WIDE RED SMALL 6 (CLIP) IMPLANT
CLOTH BEACON ORANGE TIMEOUT ST (SAFETY) ×2 IMPLANT
COVER MAYO STAND STRL (DRAPES) ×2 IMPLANT
COVER TABLE BACK 60X90 (DRAPES) ×2 IMPLANT
DECANTER SPIKE VIAL GLASS SM (MISCELLANEOUS) ×2 IMPLANT
DERMABOND ADVANCED (GAUZE/BANDAGES/DRESSINGS) ×1
DERMABOND ADVANCED .7 DNX12 (GAUZE/BANDAGES/DRESSINGS) ×1 IMPLANT
DEVICE DUBIN W/COMP PLATE 8390 (MISCELLANEOUS) ×1 IMPLANT
DRAPE LAPAROSCOPIC ABDOMINAL (DRAPES) IMPLANT
DRAPE PED LAPAROTOMY (DRAPES) ×2 IMPLANT
DRAPE UTILITY XL STRL (DRAPES) ×2 IMPLANT
ELECT COATED BLADE 2.86 ST (ELECTRODE) ×2 IMPLANT
ELECT REM PT RETURN 9FT ADLT (ELECTROSURGICAL) ×2
ELECTRODE REM PT RTRN 9FT ADLT (ELECTROSURGICAL) ×1 IMPLANT
GLOVE BIOGEL PI IND STRL 7.5 (GLOVE) IMPLANT
GLOVE BIOGEL PI IND STRL 8 (GLOVE) ×1 IMPLANT
GLOVE BIOGEL PI INDICATOR 7.5 (GLOVE) ×1
GLOVE BIOGEL PI INDICATOR 8 (GLOVE) ×1
GLOVE ECLIPSE 7.5 STRL STRAW (GLOVE) ×1 IMPLANT
GLOVE ECLIPSE 8.0 STRL XLNG CF (GLOVE) ×3 IMPLANT
GOWN PREVENTION PLUS XLARGE (GOWN DISPOSABLE) ×3 IMPLANT
KIT MARKER MARGIN INK (KITS) ×1 IMPLANT
NDL HYPO 25X1 1.5 SAFETY (NEEDLE) ×1 IMPLANT
NEEDLE HYPO 25X1 1.5 SAFETY (NEEDLE) ×2 IMPLANT
NS IRRIG 1000ML POUR BTL (IV SOLUTION) ×2 IMPLANT
PACK BASIN DAY SURGERY FS (CUSTOM PROCEDURE TRAY) ×2 IMPLANT
PENCIL BUTTON HOLSTER BLD 10FT (ELECTRODE) ×2 IMPLANT
SLEEVE SCD COMPRESS KNEE MED (MISCELLANEOUS) ×2 IMPLANT
SPONGE LAP 4X18 X RAY DECT (DISPOSABLE) ×2 IMPLANT
STAPLER VISISTAT 35W (STAPLE) IMPLANT
SUT MON AB 4-0 PC3 18 (SUTURE) ×2 IMPLANT
SUT SILK 2 0 SH (SUTURE) IMPLANT
SUT VIC AB 3-0 SH 27 (SUTURE) ×2
SUT VIC AB 3-0 SH 27X BRD (SUTURE) ×1 IMPLANT
SYR CONTROL 10ML LL (SYRINGE) ×2 IMPLANT
TOWEL OR 17X24 6PK STRL BLUE (TOWEL DISPOSABLE) ×3 IMPLANT
TOWEL OR NON WOVEN STRL DISP B (DISPOSABLE) ×2 IMPLANT
TUBE CONNECTING 20X1/4 (TUBING) ×2 IMPLANT
WATER STERILE IRR 1000ML POUR (IV SOLUTION) ×2 IMPLANT
YANKAUER SUCT BULB TIP NO VENT (SUCTIONS) ×2 IMPLANT

## 2012-04-10 NOTE — H&P (View-Only) (Signed)
Patient ID: [name], female   DOB: 09/04/1950, 61 y.o.   MRN: [id number]  No chief complaint on file.   HPI [name] is a 61 y.o. female.  Patient sent at the request of Dr.Hu of the [facility] [place] do to a right breast mammographic abnormality. This was visualized by mammography and ultrasound and felt to be atypical. Core biopsy showed atypical ductal hyperplasia with sclerosing lesion right breast at 11:00. Patient has had dense breasts throughout her life. She had a sister with breast cancer. She had lung cancer 8 years ago and is disease free. She has no complaints of breast pain, nipple discharge or breast mass. HPI  Past Medical History  Diagnosis Date  . Anemia   . Arthritis   . Cancer     lung  . Thyroid disease     Past Surgical History  Procedure Date  . Lung removal, partial   . Appendectomy   . Wisdom tooth extraction   . Finger surgery   . Nasal sinus surgery     Family History  Problem Relation Age of Onset  . Breast cancer Sister   . Breast cancer Maternal Aunt   . Stomach cancer    . Leukemia Father     Social History History  Substance Use Topics  . Smoking status: Former Smoker    Quit date: 03/14/2005  . Smokeless tobacco: Not on file  . Alcohol Use: Yes    Allergies  Allergen Reactions  . Asa (Aspirin)     Current Outpatient Prescriptions  Medication Sig Dispense Refill  . cycloSPORINE (RESTASIS) 0.05 % ophthalmic emulsion 1 drop 2 (two) times daily.      . fluticasone (FLONASE) 50 MCG/ACT nasal spray       . Multiple Vitamin (MULTIVITAMIN) tablet Take 1 tablet by mouth daily.      . PATANOL 0.1 % ophthalmic solution       . Polyethyl Glycol-Propyl Glycol (SYSTANE OP) Apply to eye.        Review of Systems Review of Systems  Constitutional: Negative.   HENT: Negative.   Eyes: Negative.   Respiratory: Negative.   Cardiovascular: Negative.   Gastrointestinal: Negative.   Genitourinary: Negative.     Musculoskeletal: Negative.   Neurological: Negative.   Hematological: Negative.   Psychiatric/Behavioral: Negative.     Blood pressure 120/78, pulse 74, temperature 97.4 F (36.3 C), resp. rate 14, height 5' 4" (1.626 m), weight 161 lb (73.029 kg).  Physical Exam Physical Exam  Constitutional: She is oriented to person, place, and time. She appears well-developed and well-nourished.  HENT:  Head: Normocephalic and atraumatic.  Eyes: EOM are normal. Pupils are equal, round, and reactive to light.  Neck: Normal range of motion. Neck supple.  Cardiovascular: Normal rate and regular rhythm.   Pulmonary/Chest: Effort normal and breath sounds normal. Right breast exhibits tenderness. Right breast exhibits no inverted nipple, no mass and no nipple discharge. Left breast exhibits no inverted nipple, no mass, no nipple discharge, no skin change and no tenderness. Breasts are symmetrical.    Abdominal: Soft. Bowel sounds are normal.  Musculoskeletal: Normal range of motion.  Neurological: She is alert and oriented to person, place, and time. She has normal reflexes.  Skin: Skin is warm and dry.  Psychiatric: She has a normal mood and affect. Her behavior is normal. Judgment and thought content normal.    Data Reviewed  Findings: Spot compression CC and MLO views of the right   breast  are submitted. There is persistent distortion identified within  the slightly upper slightly lateral right breast.  Ultrasound is performed, showing angulated hypoechoic lesion at the  right breast 11 o'clock position 1 cm from the nipple measuring  1.25 x 0.87 x 0.78 cm. This correlates to the mammographic  finding.  Assessment    RIGHT BREAST ADH  WITH SCLEROSING LESION    Plan    Recommend right breast needle localized partial mastectomy given small risk of malignancy in this setting. Patient would like to proceed.The procedure has been discussed with the patient. Alternatives to surgery have been  discussed with the patient.  Risks of surgery include bleeding,  Infection,  Seroma formation, death,  and the need for further surgery.   The patient understands and wishes to proceed.       Kaelen Brennan A. 03/14/2012, 10:29 AM    

## 2012-04-10 NOTE — Transfer of Care (Signed)
Immediate Anesthesia Transfer of Care Note  Patient: [name]  Procedure(s) Performed: Procedure(s) (LRB) with comments: PARTIAL MASTECTOMY WITH NEEDLE LOCALIZATION (Right)  Patient Location: PACU  Anesthesia Type:MAC  Level of Consciousness: awake  Airway & Oxygen Therapy: Patient Spontanous Breathing and Patient connected to face mask oxygen  Post-op Assessment: Report given to PACU RN and Post -op Vital signs reviewed and stable  Post vital signs: Reviewed and stable  Complications: No apparent anesthesia complications

## 2012-04-10 NOTE — Anesthesia Procedure Notes (Signed)
Procedure Name: MAC Date/Time: 04/10/2012 11:13 AM Performed by: Zenia Resides D Pre-anesthesia Checklist: Patient identified, Emergency Drugs available, Suction available and Patient being monitored Patient Re-evaluated:Patient Re-evaluated prior to inductionOxygen Delivery Method: Simple face mask Ventilation: Oral airway inserted - appropriate to patient size Placement Confirmation: CO2 detector

## 2012-04-10 NOTE — Anesthesia Preprocedure Evaluation (Signed)
Anesthesia Evaluation  Patient identified by MRN, date of birth, ID band Patient awake    Reviewed: Allergy & Precautions, H&P , NPO status , Patient's Chart, lab work & pertinent test results  Airway Mallampati: I TM Distance: >3 FB Neck ROM: Full    Dental  (+) Teeth Intact and Dental Advisory Given   Pulmonary  breath sounds clear to auscultation        Cardiovascular Rhythm:Regular Rate:Normal     Neuro/Psych    GI/Hepatic   Endo/Other    Renal/GU      Musculoskeletal   Abdominal   Peds  Hematology   Anesthesia Other Findings   Reproductive/Obstetrics                           Anesthesia Physical Anesthesia Plan  ASA: II  Anesthesia Plan: MAC   Post-op Pain Management:    Induction: Intravenous  Airway Management Planned: Simple Face Mask and Mask  Additional Equipment:   Intra-op Plan:   Post-operative Plan:   Informed Consent: I have reviewed the patients History and Physical, chart, labs and discussed the procedure including the risks, benefits and alternatives for the proposed anesthesia with the patient or authorized representative who has indicated his/her understanding and acceptance.   Dental advisory given  Plan Discussed with: CRNA, Anesthesiologist and Surgeon  Anesthesia Plan Comments:         Anesthesia Quick Evaluation

## 2012-04-10 NOTE — Interval H&P Note (Signed)
History and Physical Interval Note:  04/10/2012 11:00 AM  [name]  has presented today for surgery, with the diagnosis of right breast lesion  The various methods of treatment have been discussed with the patient and family. After consideration of risks, benefits and other options for treatment, the patient has consented to  Procedure(s) (LRB) with comments: PARTIAL MASTECTOMY WITH NEEDLE LOCALIZATION (Right) as a surgical intervention .  The patient's history has been reviewed, patient examined, no change in status, stable for surgery.  I have reviewed the patient's chart and labs.  Questions were answered to the patient's satisfaction.     Rula Keniston A.

## 2012-04-10 NOTE — Op Note (Signed)
Preoperative diagnosis: Right breast ADH and calcifications  Postop diagnosis: Same  Procedure: Right  Partial mastectomy with wire localization  Surgeon: Harriette Bouillon M.D.  Anesthesia: LMA with 0.25% Sensorcaine local  EBL: Less than 40 cc  Specimen:  Right Breast mass and calcifications  with wire and clip verified by radiography to pathology  Drains: None  Indications for procedure: The patient presents with a breast mass. Core biopsy showed it to be consistent with ADH .Marland Kitchen The patient agreed  to proceed with partial mastectomy with wire localization.  Description of procedure: The patient was seen in the holding area and the appropriate side was marked. Questions are answered. Wire localization was done the radiology. The patient was taken back to the operating room and placed supine on the operating room table. After induction of general anesthesia, chest and upper arm  were prepped and draped in a sterile fashion on the right. Timeout was done and she received preoperative antibiotics. Curvilinear incision was made around the wire insertion site. All tissue around the wire was excised and hemostasis was achieved with cautery. The area was removed in its entirety upon gross examination. Additional lateral margin taken and sent.  Gross margin negative. Radiograph revealed the mass, wire and clip to be in the specimen. The wound was closed in layers using 3-0 Vicryl and 4-0 Monocryl subcuticular stitch. Dermabond applied. All final counts found to be correct. Patient awoke extubated taken recovery in satisfactory condition.

## 2012-04-10 NOTE — Anesthesia Postprocedure Evaluation (Signed)
  Anesthesia Post-op Note  Patient: [name]  Procedure(s) Performed: Procedure(s) (LRB) with comments: PARTIAL MASTECTOMY WITH NEEDLE LOCALIZATION (Right)  Patient Location: PACU  Anesthesia Type:General  Level of Consciousness: awake, alert  and oriented  Airway and Oxygen Therapy: Patient Spontanous Breathing  Post-op Pain: mild  Post-op Assessment: Post-op Vital signs reviewed  Post-op Vital Signs: Reviewed  Complications: No apparent anesthesia complications

## 2012-04-11 ENCOUNTER — Encounter (HOSPITAL_BASED_OUTPATIENT_CLINIC_OR_DEPARTMENT_OTHER): Payer: Self-pay | Admitting: Surgery

## 2012-04-16 ENCOUNTER — Telehealth: Payer: Self-pay | Admitting: *Deleted

## 2012-04-16 ENCOUNTER — Other Ambulatory Visit (INDEPENDENT_AMBULATORY_CARE_PROVIDER_SITE_OTHER): Payer: Self-pay

## 2012-04-16 DIAGNOSIS — C50919 Malignant neoplasm of unspecified site of unspecified female breast: Secondary | ICD-10-CM

## 2012-04-16 NOTE — Telephone Encounter (Signed)
Left message for pt to return my call so I can schedule a med onc appt. 

## 2012-04-17 ENCOUNTER — Telehealth: Payer: Self-pay | Admitting: Internal Medicine

## 2012-04-17 NOTE — Telephone Encounter (Signed)
LVOM for pt to return call.  °

## 2012-04-25 ENCOUNTER — Ambulatory Visit (INDEPENDENT_AMBULATORY_CARE_PROVIDER_SITE_OTHER): Payer: Managed Care, Other (non HMO) | Admitting: Surgery

## 2012-04-25 ENCOUNTER — Encounter (INDEPENDENT_AMBULATORY_CARE_PROVIDER_SITE_OTHER): Payer: Self-pay | Admitting: Surgery

## 2012-04-25 VITALS — BP 121/77 | HR 66 | Temp 98.3°F | Resp 12 | Ht 64.0 in | Wt 157.6 lb

## 2012-04-25 DIAGNOSIS — Z9889 Other specified postprocedural states: Secondary | ICD-10-CM

## 2012-04-25 NOTE — Patient Instructions (Signed)
Return 3 months

## 2012-04-25 NOTE — Progress Notes (Signed)
Patient returns after her right breast lumpectomy for DCIS. Margins are clear. This was intermediate grade measuring 1.4 cm. She has a relationship with Dr. Gwenyth Bouillon already and we'll see him in a week.  Exam: Incision clean dry intact. No signs of infection.  Impression: Status post right breast needle localized partial mastectomy showing DCIS intermediate grade 1.4 cm with negative margins  Plan: She will see radiation oncology.  Return 3 months.

## 2012-04-28 ENCOUNTER — Ambulatory Visit (HOSPITAL_BASED_OUTPATIENT_CLINIC_OR_DEPARTMENT_OTHER): Payer: Managed Care, Other (non HMO) | Admitting: Internal Medicine

## 2012-04-28 ENCOUNTER — Encounter: Payer: Self-pay | Admitting: Internal Medicine

## 2012-04-28 ENCOUNTER — Telehealth: Payer: Self-pay | Admitting: Internal Medicine

## 2012-04-28 ENCOUNTER — Ambulatory Visit: Payer: Managed Care, Other (non HMO)

## 2012-04-28 ENCOUNTER — Other Ambulatory Visit (HOSPITAL_BASED_OUTPATIENT_CLINIC_OR_DEPARTMENT_OTHER): Payer: Managed Care, Other (non HMO) | Admitting: Lab

## 2012-04-28 VITALS — BP 121/71 | HR 65 | Temp 97.6°F | Resp 20 | Ht 64.0 in | Wt 159.6 lb

## 2012-04-28 DIAGNOSIS — C50919 Malignant neoplasm of unspecified site of unspecified female breast: Secondary | ICD-10-CM

## 2012-04-28 DIAGNOSIS — C3490 Malignant neoplasm of unspecified part of unspecified bronchus or lung: Secondary | ICD-10-CM | POA: Insufficient documentation

## 2012-04-28 DIAGNOSIS — D0590 Unspecified type of carcinoma in situ of unspecified breast: Secondary | ICD-10-CM

## 2012-04-28 DIAGNOSIS — Z85118 Personal history of other malignant neoplasm of bronchus and lung: Secondary | ICD-10-CM

## 2012-04-28 DIAGNOSIS — D0511 Intraductal carcinoma in situ of right breast: Secondary | ICD-10-CM | POA: Insufficient documentation

## 2012-04-28 LAB — COMPREHENSIVE METABOLIC PANEL (CC13)
CO2: 25 mEq/L (ref 22–29)
Calcium: 9.1 mg/dL (ref 8.4–10.4)
Chloride: 108 mEq/L — ABNORMAL HIGH (ref 98–107)
Glucose: 99 mg/dl (ref 70–99)
Sodium: 143 mEq/L (ref 136–145)
Total Bilirubin: 0.38 mg/dL (ref 0.20–1.20)
Total Protein: 6.9 g/dL (ref 6.4–8.3)

## 2012-04-28 LAB — CBC WITH DIFFERENTIAL/PLATELET
Basophils Absolute: 0 10*3/uL (ref 0.0–0.1)
Eosinophils Absolute: 0.4 10*3/uL (ref 0.0–0.5)
HGB: 14.3 g/dL (ref 11.6–15.9)
LYMPH%: 28.3 % (ref 14.0–49.7)
MCV: 89.5 fL (ref 79.5–101.0)
MONO#: 0.5 10*3/uL (ref 0.1–0.9)
MONO%: 7.8 % (ref 0.0–14.0)
NEUT#: 3.7 10*3/uL (ref 1.5–6.5)
Platelets: 363 10*3/uL (ref 145–400)
RDW: 13.5 % (ref 11.2–14.5)
WBC: 6.4 10*3/uL (ref 3.9–10.3)

## 2012-04-28 NOTE — Patient Instructions (Signed)
Your recently diagnosed with ductal carcinoma in situ of the right breast. We discussed adjuvant radiotherapy followed by hormonal treatment. Followup in 2 months.

## 2012-04-28 NOTE — Progress Notes (Signed)
[facility] [phone]   [phone]  CONSULT NOTE  REFERRING PHYSICIAN: Dr. [name]  REASON FOR CONSULTATION:  62 years old white female recently diagnosed with breast cancer  HPI [name] is a 62 y.o. female was past medical history significant for stage II a non-small cell lung cancer diagnosed in March of 2006 is status post left trisegmentectomy with node dissection on 06/24/2004 followed by 4 cycles of adjuvant chemotherapy with carboplatin and paclitaxel, last dose was given 10/24/2004. The patient has been followed by observation for her lung cancer was no evidence for disease recurrence since that time. She had a recent routine mammogram at the breast Center on 02/15/2012 and and was found to have mammographic abnormality. This was followed by additional diagnostic right mammogram and right breast ultrasound on 02/22/2012 and it showed ambulated hypoechoic lesion at the right breast 11 o'clock position 1.0 CM from the nipple measuring 1.25x0.87X0.78 CM. On 03/06/2012 the patient underwent ultrasound-guided biopsy of the right breast mass and the final pathology showed complex sclerosing lesion with atypical ductal hyperplasia and calcifications. The patient was referred to Dr. [name] and on 04/10/2012 she underwent right partial mastectomy. The final pathology showed multifocal indeterminate grade ductal carcinoma in situ with necrosis and calcification. Approximately 1.4 CM Worth of the breast tissue is involved with the largest focus spanning up to 1.1 CM in greatest dimension. One FOCUS of ductal carcinoma in situ is associated with a complex sclerosing lesion with calcification and atypical ductal hyperplasia. The resection margin was negative. The prognostic indicators showed 100% intensity for estrogen as well as progesterone receptors. The patient was referred to me today for further evaluation and recommendation regarding treatment of  her condition. She is also scheduled to see Dr. Dayton Scrape for consideration of adjuvant radiotherapy. The patient is feeling fine today with no specific complaints except for mild soreness and bruises at the incision site at the right breast. She denied having any significant fatigue or weakness. She denied having any chest pain, shortness breath, cough or hemoptysis. Her family history is significant for breast cancer in sister and maternal aunt.  @SFHPI @  Past Medical History  Diagnosis Date  . Anemia   . Arthritis   . Thyroid disease   . Cancer 2006    lung    Past Surgical History  Procedure Date  . Lung removal, partial   . Wisdom tooth extraction   . Finger surgery   . Nasal sinus surgery   . Video assisted thoracoscopy (vats)/ lobectomy 2006    left upper lobe  . Trigger finger release 2004,2001    multiple-lt and rt hands  . Nasal sinus surgery     multiple sinus surg  . Appendectomy 1971  . Partial mastectomy with needle localization 04/10/2012    Procedure: PARTIAL MASTECTOMY WITH NEEDLE LOCALIZATION;  Surgeon: Maisie Fus A. Cornett, MD;  Location:  SURGERY CENTER;  Service: General;  Laterality: Right;  . Breast surgery     Family History  Problem Relation Age of Onset  . Breast cancer Sister   . Breast cancer Maternal Aunt   . Stomach cancer    . Leukemia Father     Social History History  Substance Use Topics  . Smoking status: Former Smoker    Quit date: 03/14/2005  . Smokeless tobacco: Not on file  . Alcohol Use: Yes    Allergies  Allergen Reactions  . Penicillins Hives  . Adhesive (Tape) Other (See Comments)  redness  . Amoxicillin Hives  . Asa (Aspirin)   . Banana   . Ceftin (Cefuroxime Axetil) Diarrhea  . Lactose Intolerance (Gi)   . Orange Fruit (Citrus)     Current Outpatient Prescriptions  Medication Sig Dispense Refill  . Ascorbic Acid (VITAMIN C PO) Take 500 mg by mouth as needed.      . cycloSPORINE (RESTASIS) 0.05 %  ophthalmic emulsion 1 drop 2 (two) times daily.      Marland Kitchen ECHINACEA PO Take by mouth as needed.      . fluticasone (FLONASE) 50 MCG/ACT nasal spray       . Multiple Vitamin (MULTIVITAMIN) tablet Take 1 tablet by mouth daily.      Marland Kitchen PATANOL 0.1 % ophthalmic solution       . Polyethyl Glycol-Propyl Glycol (SYSTANE OP) Apply to eye.      Marland Kitchen Zinc 50 MG TABS Take by mouth as needed.      . ranitidine (ZANTAC) 300 MG tablet Take 300 mg by mouth as needed.        Review of Systems  A comprehensive review of systems was negative.  Physical Exam  WJX:BJYNW, healthy, no distress, well nourished and well developed SKIN: skin color, texture, turgor are normal HEAD: Normocephalic, No masses, lesions, tenderness or abnormalities EYES: normal, PERRLA EARS: External ears normal OROPHARYNX:no exudate and no erythema  NECK: supple, no adenopathy LYMPH:  no palpable lymphadenopathy, no hepatosplenomegaly BREAST:surgical scars noted with mild bruises at the right breast. LUNGS: clear to auscultation  HEART: regular rate & rhythm, no murmurs and no gallops ABDOMEN:abdomen soft, non-tender, normal bowel sounds and no masses or organomegaly BACK: Back symmetric, no curvature. EXTREMITIES:no joint deformities, effusion, or inflammation, no edema, no skin discoloration, no clubbing  NEURO: alert & oriented x 3 with fluent speech, no focal motor/sensory deficits  PERFORMANCE STATUS: ECOG 0  LABORATORY DATA: Lab Results  Component Value Date   WBC 6.4 04/28/2012   HGB 14.3 04/28/2012   HCT 40.9 04/28/2012   MCV 89.5 04/28/2012   PLT 363 04/28/2012      Chemistry      Component Value Date/Time   NA 140 04/08/2012 0900   K 4.1 04/08/2012 0900   CL 103 04/08/2012 0900   CO2 26 04/08/2012 0900   BUN 18 04/08/2012 0900   CREATININE 0.74 04/08/2012 0900      Component Value Date/Time   CALCIUM 9.4 04/08/2012 0900   ALKPHOS 83 04/08/2012 0900   AST 21 04/08/2012 0900   ALT 16 04/08/2012 0900   BILITOT 0.3 04/08/2012  0900       RADIOGRAPHIC STUDIES: [facility] Breast Loc Dev   1st Lesion  Inc Mammo Guide  04/10/2012  *RADIOLOGY REPORT*  Clinical Data:  Right breast atypical ductal hyperplasia for surgical excision.  NEEDLE LOCALIZATION WITH MAMMOGRAPHIC GUIDANCE AND SPECIMEN RADIOGRAPH  Comparison:  Previous exams.  Patient presents for needle localization prior to right breast surgery.  I met with the patient and we discussed the procedure of needle localization including benefits and alternatives. We discussed the high likelihood of a successful procedure. We discussed the risks of the procedure, including infection, bleeding, tissue injury, and further surgery. Informed, written consent was given.  Using mammographic guidance, sterile technique, 2% lidocaine and a a 7 cm modified Kopans needle, biopsy clip was localized using a cranial approach.  The films are marked for Dr. Davina Poke.  Specimen radiograph was performed at day surgery, and confirms biopsy clip and wire present  in the tissue sample.  The specimen is marked for pathology.  IMPRESSION: Needle localization right breast.  No apparent complications.   Original Report Authenticated By: Sherian Rein, M.D.     ASSESSMENT: This is a very pleasant 62 years old white female recently diagnosed with ductal carcinoma in situ of the right breast is status post right lumpectomy. The patient is doing fine and currently asymptomatic.  PLAN: I have a lengthy discussion with the patient today about her disease stage, prognosis and treatment options. The patient may be a candidate for the adjuvant trial NSABP [id number] and she will be seen by the clinical research nurse today. The patient has no objection to be enrolled in the trial as long it would not take much time from her Job.  I also discussed with the patient her treatment options including proceeding with adjuvant radiotherapy. She is scheduled to see Dr. Dayton Scrape in the next few days. I also discussed with the  patient adjuvant hormonal therapy with aromatase inhibitor or tamoxifen. This would be started after completion of her radiotherapy. In the meantime, I will refer the patient to Dr. Darnelle Catalan our breast expert for a second opinion regarding her treatment options. I would see the patient back for followup visit in 2 months for reevaluation and starting the hormonal therapy. She was advised to call me immediately if she has any concerning symptoms in the interval.  All questions were answered. The patient knows to call the clinic with any problems, questions or concerns. We can certainly see the patient much sooner if necessary.  Thank you so much for allowing me to participate in the care of [name]. I will continue to follow up the patient with you and assist in her care.  I spent 30 minutes counseling the patient face to face. The total time spent in the appointment was 55 minutes.  Skyann Ganim K. 04/28/2012, 10:40 AM

## 2012-04-28 NOTE — Progress Notes (Signed)
Checked in new pt.  Her only concerns was that she wasn't communicated properly about her appointments today.  She wasn't told about the financial counselor or lab appointments.  She said she works full time so she has to schedule everything just right.  I apologized for the miscommunication and I bought Darlena over so she can voice her concerns with her.

## 2012-04-28 NOTE — Telephone Encounter (Signed)
Gave pt apt for February with Dr.Magrinat and see Dr. Arbutus Ped on April 2014

## 2012-04-30 ENCOUNTER — Ambulatory Visit: Payer: Managed Care, Other (non HMO)

## 2012-04-30 ENCOUNTER — Ambulatory Visit: Payer: Managed Care, Other (non HMO) | Admitting: Radiation Oncology

## 2012-05-05 ENCOUNTER — Encounter: Payer: Self-pay | Admitting: Radiation Oncology

## 2012-05-06 ENCOUNTER — Encounter: Payer: Self-pay | Admitting: Radiation Oncology

## 2012-05-06 ENCOUNTER — Ambulatory Visit
Admission: RE | Admit: 2012-05-06 | Discharge: 2012-05-06 | Disposition: A | Discharge status: Home / Self Care | Payer: Managed Care, Other (non HMO) | Source: Ambulatory Visit | Attending: Radiation Oncology | Admitting: Radiation Oncology

## 2012-05-06 VITALS — BP 131/77 | HR 88 | Temp 98.2°F | Resp 20 | Ht 64.0 in | Wt 157.2 lb

## 2012-05-06 DIAGNOSIS — Z806 Family history of leukemia: Secondary | ICD-10-CM | POA: Insufficient documentation

## 2012-05-06 DIAGNOSIS — Z803 Family history of malignant neoplasm of breast: Secondary | ICD-10-CM | POA: Insufficient documentation

## 2012-05-06 DIAGNOSIS — D0511 Intraductal carcinoma in situ of right breast: Secondary | ICD-10-CM

## 2012-05-06 DIAGNOSIS — Z79899 Other long term (current) drug therapy: Secondary | ICD-10-CM | POA: Insufficient documentation

## 2012-05-06 DIAGNOSIS — C50919 Malignant neoplasm of unspecified site of unspecified female breast: Secondary | ICD-10-CM

## 2012-05-06 DIAGNOSIS — Z85118 Personal history of other malignant neoplasm of bronchus and lung: Secondary | ICD-10-CM | POA: Insufficient documentation

## 2012-05-06 DIAGNOSIS — Z800 Family history of malignant neoplasm of digestive organs: Secondary | ICD-10-CM | POA: Insufficient documentation

## 2012-05-06 DIAGNOSIS — Z170 Estrogen receptor positive status [ER+]: Secondary | ICD-10-CM | POA: Insufficient documentation

## 2012-05-06 DIAGNOSIS — D0590 Unspecified type of carcinoma in situ of unspecified breast: Secondary | ICD-10-CM | POA: Insufficient documentation

## 2012-05-06 DIAGNOSIS — Z87891 Personal history of nicotine dependence: Secondary | ICD-10-CM | POA: Insufficient documentation

## 2012-05-06 HISTORY — DX: Malignant neoplasm of unspecified site of unspecified female breast: C50.919

## 2012-05-06 NOTE — Addendum Note (Signed)
Encounter addended by: Maryln Gottron, MD on: 05/06/2012  9:13 AM<BR>     Documentation filed: Notes Section

## 2012-05-06 NOTE — Progress Notes (Addendum)
[facility] Radiation Oncology NEW PATIENT EVALUATION  Name: [name] MRN: [id number]  Date:   05/06/2012           DOB: 25-May-1950  Status: outpatient   CC: [name], MD  Cornett, Clovis Pu., MD    REFERRING PHYSICIAN: Cornett, Clovis Pu., MD   DIAGNOSIS: Stage 0 (Tis N0 M0) multifocal DCIS of the right breast   HISTORY OF PRESENT ILLNESS:  [name] is a 62 y.o. female who is seen today for the courtesy of Dr. [name] for consideration of radiation therapy following conservative surgery in the management of her DCIS of the right breast. At the time of a screening [facility] at [facility] she was noted to have a possible mass within the right breast. Additional views showed "distortion" within the upper lateral right breast. There were no suspicious microcalcifications. An ultrasound showed a hypoechoic lesion within the right breast at 11:00, 1 cm from the nipple measuring 1.25 x 0.87 x 0.78 cm. An ultrasound-guided biopsy on 03/06/2012 showed a complex sclerosing lesion with atypical ductal hyperplasia and calcifications. She was seen by Dr. [name] who performed a right partial mastectomy on 04/10/2012. Within the "lumpectomy" specimen she is found to have multifocal intermediate grade DCIS with necrosis and calcification. Approximately 1.4 cm with the breast tissue was involved with the largest focus spanning 1.1 cm. One focus of DCIS was associated with a complex sclerosing lesion with calcifications and atypical ductal hyperplasia. Excision of the right lateral margin revealed multifocal low to high-grade DCIS with necrosis and calcification. 1.2 cm with the tissue was involved. Her DCIS was strongly ER/PR positive at 100%. HER-2/neu was not tested. She was offered entry into the B. 43 study, but she declined because of work-related issues. She is without complaints today.  PREVIOUS RADIATION THERAPY: No   PAST MEDICAL HISTORY:  has a past medical  history of Anemia; Arthritis; Thyroid disease; Cancer (2006); Ductal carcinoma in situ of right breast diagnosed in December of 2013, status post right lumpectomy in January of 2014 (04/28/2012); Breast cancer (03/06/12); Breast cancer (04/10/12); Allergy; and Lung cancer, diagnosed in March of 2006 stage II a status post left trisegmentectomy with lymph node dissection followed by 4 cycles of adjuvant chemotherapy.     PAST SURGICAL HISTORY:  Past Surgical History  Procedure Laterality Date  . Lung removal, partial    . Wisdom tooth extraction    . Finger surgery    . Nasal sinus surgery    . Video assisted thoracoscopy (vats)/ lobectomy  2006    left upper lobe  . Trigger finger release  2004,2001    multiple-lt and rt hands  . Nasal sinus surgery      multiple sinus surg  . Appendectomy  1971  . Partial mastectomy with needle localization  04/10/2012    Procedure: PARTIAL MASTECTOMY WITH NEEDLE LOCALIZATION;  Surgeon: Maisie Fus A. Cornett, MD;  Location: [facility];  Service: General;  Laterality: Right;  . Breast surgery     History of stage IIA non-small cell carcinoma of the left lung treated by lobectomy and adjuvant chemotherapy on protocol through Dr. Arbutus Ped.  FAMILY HISTORY: family history includes Breast cancer in her maternal aunt and sister; Cancer in her father, maternal aunt, and sister; Leukemia in her father; and Stomach cancer in an unspecified family member. Her sister was diagnosed with DCIS at age 65, treated by partial mastectomy radiation therapy. Her mother is alive at age 47,  her father died from [facility] at 95.   SOCIAL HISTORY:  reports that she quit smoking about 7 years ago. Her smoking use included Cigarettes. She has a 35 pack-year smoking history. She does not have any smokeless tobacco history on file. She reports that  drinks alcohol. She reports that she does not use illicit drugs. married, one daughter, 3 grandchildren. 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   ALLERGIES: Penicillins; Adhesive; Amoxicillin; Asa; Banana; Ceftin; Lactose intolerance (gi); and Orange fruit   MEDICATIONS:  Current Outpatient Prescriptions  Medication Sig Dispense Refill  . cycloSPORINE (RESTASIS) 0.05 % ophthalmic emulsion 1 drop 2 (two) times daily.      . fluticasone (FLONASE) 50 MCG/ACT nasal spray       . guaifenesin (HUMIBID E) 400 MG TABS Take 400 mg by mouth as needed.      . Multiple Vitamin (MULTIVITAMIN) tablet Take 1 tablet by mouth daily.      Marland Kitchen PATANOL 0.1 % ophthalmic solution Place into both eyes as needed.       Bertram Gala Glycol-Propyl Glycol (SYSTANE OP) Apply 1 drop to eye as needed.       . pseudoephedrine (SUDAFED) 30 MG tablet Take 30 mg by mouth as needed for congestion.      . sulfamethoxazole-trimethoprim (BACTRIM DS) 800-160 MG per tablet Take 1 tablet by mouth 2 (two) times daily.      . Ascorbic Acid (VITAMIN C PO) Take 500 mg by mouth as needed.      Marland Kitchen ECHINACEA PO Take by mouth as needed.      . ranitidine (ZANTAC) 300 MG tablet Take 300 mg by mouth as needed.      . Zinc 50 MG TABS Take by mouth as needed.       No current facility-administered medications for this encounter.     REVIEW OF SYSTEMS:  Pertinent items are noted in HPI.    PHYSICAL EXAM:  height is 5\' 4"  (1.626 m) and weight is 157 lb 3.2 oz (71.305 kg). Her oral temperature is 98.2 F (36.8 C). Her blood pressure is 131/77 and her pulse is 88. Her respiration is 20.   Alert and oriented 62 year old white female appearing younger than her stated age. Head and neck examination: Grossly unremarkable. Nodes: Without palpable cervical, supraclavicular, or axillary lymphadenopathy. Chest: Lungs clear. Left chest scar is from VATS procedure. Back: Without spinal or CVA tenderness. Heart: Regular rate rhythm. Breasts: There is a horizontal partial mastectomy wound along this. Aspect of the right breast adjacent to the  edge of the nipple areolar complex extending from 11 to 1:00. There is the usual degree of underlying induration. No masses are appreciated. Left breast without masses or lesions. Abdomen without hepatomegaly. Extremities: Without edema. Neurologic examination: Grossly nonfocal.   LABORATORY DATA:  Lab Results  Component Value Date   WBC 6.4 04/28/2012   HGB 14.3 04/28/2012   HCT 40.9 04/28/2012   MCV 89.5 04/28/2012   PLT 363 04/28/2012   Lab Results  Component Value Date   NA 143 04/28/2012   K 4.3 04/28/2012   CL 108* 04/28/2012   CO2 25 04/28/2012   Lab Results  Component Value Date   ALT 17 04/28/2012   AST 18 04/28/2012   ALKPHOS 80 04/28/2012   BILITOT 0.38 04/28/2012      IMPRESSION: Stage 0 (Tis N0 M0) multifocal low to high-grade DCIS with necrosis. Her surgical margins are satisfactory.  Her local treatment options include mastectomy versus partial mastectomy followed by radiation therapy. She desires breast preservation. We discussed hyperfractionated radiation therapy versus standard fractionation. Based on a small retrospective [place] study there appears to be slightly higher risk for local recurrence with high-grade DCIS. Therefore, I recommend standard fractionation. We discussed the potential acute and late toxicities of radiation therapy and she wishes to proceed as outlined. Consent is signed today. She will return for treatment planning later this week or early next week. Again, she has not interested in the B. 43  Herceptin study.   PLAN: As discussed above. She'll meet with Dr. Radene Gunning on following radiation therapy for discussion of antiestrogen therapy. I spent 60 minutes minutes face to face with the patient and more than 50% of that time was spent in counseling and/or coordination of care.

## 2012-05-06 NOTE — Progress Notes (Signed)
Please see the Nurse Progress Note in the MD Initial Consult Encounter for this patient. 

## 2012-05-06 NOTE — Progress Notes (Signed)
New Consult Right Breast Cancer  Right Breast Lumpectomy 04/10/12,Multifocal Intermediate grade Ductal Carcinoma in Situ  With necrosis and calicifications Dx12/12/13 right breast 11 o'clock,  Alert,oriented x3, married, 1 daughter, incision right breast well healed, tenderness Hx Lung Cancer left, 2006,surgery and chemotherapy only No history Radiation No History of a Pacemaker

## 2012-05-10 ENCOUNTER — Other Ambulatory Visit: Payer: Self-pay

## 2012-05-14 ENCOUNTER — Ambulatory Visit
Admission: RE | Admit: 2012-05-14 | Discharge: 2012-05-14 | Disposition: A | Discharge status: Home / Self Care | Payer: Managed Care, Other (non HMO) | Source: Ambulatory Visit | Attending: Radiation Oncology | Admitting: Radiation Oncology

## 2012-05-14 DIAGNOSIS — L538 Other specified erythematous conditions: Secondary | ICD-10-CM | POA: Insufficient documentation

## 2012-05-14 DIAGNOSIS — L819 Disorder of pigmentation, unspecified: Secondary | ICD-10-CM | POA: Insufficient documentation

## 2012-05-14 DIAGNOSIS — D0511 Intraductal carcinoma in situ of right breast: Secondary | ICD-10-CM

## 2012-05-14 DIAGNOSIS — L299 Pruritus, unspecified: Secondary | ICD-10-CM | POA: Insufficient documentation

## 2012-05-14 DIAGNOSIS — Z510 Encounter for antineoplastic radiation therapy: Secondary | ICD-10-CM | POA: Insufficient documentation

## 2012-05-14 DIAGNOSIS — C50919 Malignant neoplasm of unspecified site of unspecified female breast: Secondary | ICD-10-CM | POA: Insufficient documentation

## 2012-05-14 NOTE — Progress Notes (Signed)
Simulation/treatment planning note: The patient was taken to the CT simulator. She was placed on a custom breast board a custom neck mold was constructed for immobilization. Her right breast borders wwere marked with radiopaque wires along with a partial mastectomy scar along the superior aspect of the breast. She was then scanned. I contoured her right breast tumor bed. She was set up to medial and lateral right breast tangents. 2 separate multileaf collimators were designed to conform the field. I prescribing 5000 cGy 25 sessions, and we'll probably use mixed photon energies. This be followed by right breast boost for a further 1000 cGy 5 sessions. Dosimetry and an isodose plan are requested.

## 2012-05-15 ENCOUNTER — Ambulatory Visit (HOSPITAL_BASED_OUTPATIENT_CLINIC_OR_DEPARTMENT_OTHER): Payer: Managed Care, Other (non HMO) | Admitting: Oncology

## 2012-05-15 VITALS — BP 132/84 | HR 72 | Temp 98.3°F | Resp 20 | Ht 64.0 in | Wt 156.7 lb

## 2012-05-15 DIAGNOSIS — Z85118 Personal history of other malignant neoplasm of bronchus and lung: Secondary | ICD-10-CM

## 2012-05-15 DIAGNOSIS — D0590 Unspecified type of carcinoma in situ of unspecified breast: Secondary | ICD-10-CM

## 2012-05-15 DIAGNOSIS — Z170 Estrogen receptor positive status [ER+]: Secondary | ICD-10-CM

## 2012-05-15 DIAGNOSIS — C50919 Malignant neoplasm of unspecified site of unspecified female breast: Secondary | ICD-10-CM

## 2012-05-15 MED ORDER — TAMOXIFEN CITRATE 20 MG PO TABS: 20.0000 mg | ORAL_TABLET | Freq: Every day | ORAL | Status: DC

## 2012-05-15 MED ORDER — ANASTROZOLE 1 MG PO TABS: 1.0000 mg | ORAL_TABLET | Freq: Every day | ORAL | Status: DC

## 2012-05-18 NOTE — Progress Notes (Signed)
ID: Rosalin Hawking   DOB: Dec 20, 1950  MR#: [id number]  WUJ#:[id number]  PCP: [name], MD GYN:  SU: Thomas Cornett OTHER MD: Si Gaul, Chipper Herb, Talmage Coin   HISTORY OF PRESENT ILLNESS: Aalijah had routine bilateral screening mammography 02/15/2012 showing a possible mass in her right breast. Right diagnostic mammography and ultrasonography 02/22/2012 showed a distortion in the upper lateral right breast which by ultrasound was hypoechoic and measured 1.25 cm. Biopsy of this mass 03/06/2012 and showed ([id number]) a complex sclerosing lesion with atypical ductal hyperplasia. Accordingly excisional biopsy was planned and on 04/10/2012 the patient underwent right lumpectomy, showing ([id number]) multifocal ductal carcinoma in situ, low to high-grade, with the largest lesion measuring 1.4 cm being 100% estrogen receptor and 100% progesterone receptor positive. Margins were negative.  The patient also has a history of non-small cell lung cancer, stage IIA, well summarized in Dr. Rebecca Eaton note. After her left trisegmentectomy and node dissection April 2006 she completed adjuvant chemotherapy with carboplatin and paclitaxel August of 2006.  The patient's subsequent history is as detailed below  INTERVAL HISTORY: Johni was evaluated in the breast clinic 05/15/2012 accompanied by her husband Reita Cliche  REVIEW OF SYSTEMS: She tolerated her lumpectomy without unusual side effects, and specifically without significant problems with pain, fever, bleeding, dehiscence, swelling, or erythema. She just finished a course of Septra for sinus issues. She is degenerative disc disease and a nonspecific neuropathy involving her hands and feet. She has a "hot" thyroid nodule which is followed by Dr. Sharl Ma. Overall a detailed review of systems today was noncontributory except as noted.  PAST MEDICAL HISTORY: Past Medical History  Diagnosis Date  . Anemia   . Arthritis   . Thyroid disease   .  Cancer 2006    lung  . Ductal carcinoma in situ of right breast diagnosed in December of 2013, status post right lumpectomy in January of 2014 04/28/2012  . Breast cancer 03/06/12    dx Right  mass bx=11 o'clock=atypical ductal hyperplasia   . Breast cancer 04/10/12    right lumpectomy=Multifocal interm.grade ductal cqrcinom in situ w/necrosis  ER/PR=+  . Allergy   . Lung cancer, diagnosed in March of 2006 stage II a status post left trisegmentectomy with lymph node dissection followed by 4 cycles of adjuvant chemotherapy     left upper /surgery and chemo    PAST SURGICAL HISTORY: Past Surgical History  Procedure Laterality Date  . Lung removal, partial    . Wisdom tooth extraction    . Finger surgery    . Nasal sinus surgery    . Video assisted thoracoscopy (vats)/ lobectomy  2006    left upper lobe  . Trigger finger release  2004,2001    multiple-lt and rt hands  . Nasal sinus surgery      multiple sinus surg  . Appendectomy  1971  . Partial mastectomy with needle localization  04/10/2012    Procedure: PARTIAL MASTECTOMY WITH NEEDLE LOCALIZATION;  Surgeon: Maisie Fus A. Cornett, MD;  Location: [facility];  Service: General;  Laterality: Right;  . Breast surgery      FAMILY HISTORY Family History  Problem Relation Age of Onset  . Breast cancer Sister   . Cancer Sister   . Breast cancer Maternal Aunt   . Cancer Maternal Aunt   . Stomach cancer    . Leukemia Father   . Cancer Father    the patient's father died at the age of 69 with chronic myeloid leukemia.  The patient's mother is currently living, in her mid 32s. Taylr had one brother, 2 sisters. One sister was diagnosed with ductal carcinoma in situ at age 41. The patient's mother's mother had stomach cancer in one of the patient's mother's 2 sisters had breast cancer diagnosed at age 53. There is no history of ovarian cancer in the family.  GYNECOLOGIC HISTORY: Menarche age 70, first live birth age 63, she is GX  P1. She went through the change of life approximately 2005. She did not receive hormone replacement.  SOCIAL HISTORY: Shantera works as a Network engineer. Her husband Molly Maduro "Nadine Counts" Lantigua works [employer]. Daughter Hulan Saas works as a Runner, broadcasting/film/video in [place]. The patient has 3 grandchildren.    ADVANCED DIRECTIVES: In place  HEALTH MAINTENANCE: History  Substance Use Topics  . Smoking status: Former Smoker -- 1.00 packs/day for 35 years    Types: Cigarettes    Quit date: 03/14/2005  . Smokeless tobacco: Not on file  . Alcohol Use: Yes     Comment: 1 red wine glass every night     Colonoscopy: 2005, [facility]  PAP:  Bone density: never  Lipid panel:  Allergies  Allergen Reactions  . Penicillins Hives  . Adhesive (Tape) Other (See Comments)    redness  . Amoxicillin Hives  . Asa (Aspirin)   . Banana   . Ceftin (Cefuroxime Axetil) Diarrhea  . Lactose Intolerance (Gi)   . Orange Fruit (Citrus)     Current Outpatient Prescriptions  Medication Sig Dispense Refill  . anastrozole (ARIMIDEX) 1 MG tablet Take 1 tablet (1 mg total) by mouth daily.  90 tablet  12  . Ascorbic Acid (VITAMIN C PO) Take 500 mg by mouth as needed.      . cycloSPORINE (RESTASIS) 0.05 % ophthalmic emulsion 1 drop 2 (two) times daily.      Marland Kitchen ECHINACEA PO Take by mouth as needed.      . fluticasone (FLONASE) 50 MCG/ACT nasal spray       . guaifenesin (HUMIBID E) 400 MG TABS Take 400 mg by mouth as needed.      . Multiple Vitamin (MULTIVITAMIN) tablet Take 1 tablet by mouth daily.      Marland Kitchen PATANOL 0.1 % ophthalmic solution Place into both eyes as needed.       Bertram Gala Glycol-Propyl Glycol (SYSTANE OP) Apply 1 drop to eye as needed.       . pseudoephedrine (SUDAFED) 30 MG tablet Take 30 mg by mouth as needed for congestion.      . ranitidine (ZANTAC) 300 MG tablet Take 300 mg by mouth as needed.      . sulfamethoxazole-trimethoprim (BACTRIM DS) 800-160 MG per tablet Take 1 tablet by mouth 2  (two) times daily.      . tamoxifen (NOLVADEX) 20 MG tablet Take 1 tablet (20 mg total) by mouth daily.  90 tablet  12  . Zinc 50 MG TABS Take by mouth as needed.       No current facility-administered medications for this visit.    OBJECTIVE: Middle-aged white woman who appears well Filed Vitals:   05/15/12 1635  BP: 132/84  Pulse: 72  Temp: 98.3 F (36.8 C)  Resp: 20     Body mass index is 26.88 kg/(m^2).    ECOG FS: 0  Sclerae unicteric Oropharynx clear No cervical or supraclavicular adenopathy; I do not palpate a thyroid mass Lungs no rales or rhonchi, good excursion bilaterally Heart regular rate and rhythm  Abd benign MSK no focal spinal tenderness, no peripheral edema Neuro: nonfocal, well oriented, pleasant affect Breasts: The right breast is status post recent lumpectomy. The incision is healing very nicely. There is no dehiscence, swelling, erythema, or unusual tenderness. The right axilla is benign. The left breast is unremarkable.   LAB RESULTS: Lab Results  Component Value Date   WBC 6.4 04/28/2012   NEUTROABS 3.7 04/28/2012   HGB 14.3 04/28/2012   HCT 40.9 04/28/2012   MCV 89.5 04/28/2012   PLT 363 04/28/2012      Chemistry      Component Value Date/Time   NA 143 04/28/2012 1008   NA 140 04/08/2012 0900   K 4.3 04/28/2012 1008   K 4.1 04/08/2012 0900   CL 108* 04/28/2012 1008   CL 103 04/08/2012 0900   CO2 25 04/28/2012 1008   CO2 26 04/08/2012 0900   BUN 13.0 04/28/2012 1008   BUN 18 04/08/2012 0900   CREATININE 0.8 04/28/2012 1008   CREATININE 0.74 04/08/2012 0900      Component Value Date/Time   CALCIUM 9.1 04/28/2012 1008   CALCIUM 9.4 04/08/2012 0900   ALKPHOS 80 04/28/2012 1008   ALKPHOS 83 04/08/2012 0900   AST 18 04/28/2012 1008   AST 21 04/08/2012 0900   ALT 17 04/28/2012 1008   ALT 16 04/08/2012 0900   BILITOT 0.38 04/28/2012 1008   BILITOT 0.3 04/08/2012 0900       Lab Results  Component Value Date   LABCA2 40* 04/28/2012    No components found with this basename:  LABCA125    No results found for this basename: INR,  in the last 168 hours  Urinalysis No results found for this basename: colorurine, appearanceur, labspec, phurine, glucoseu, hgbur, bilirubinur, ketonesur, proteinur, urobilinogen, nitrite, leukocytesur    STUDIES: No results found.  ASSESSMENT: 62 y.o. [place] woman  (1) status post left trisegmentectomy with node dissection April 2006 for a stage IIA large cell lung cancer treated adjuvantly with carboplatin and paclitaxel x4 cycles  (2) status post right breast lumpectomy 02/08/2013 for multifocal ductal carcinoma in situ, estrogen and progesterone receptor positive, with negative margins.  PLAN: We spent the better part of her hour-plus visit today going over her the biology of breast cancer and the specifics of her own situation. She understands that noninvasive breast cancer is in itself not life-threatening. There is a risk of local recurrence and she will reduce that risk significantly by taking post lumpectomy radiation. Anti-estrogens with again cut the residual low risk in half so the actual benefit from antiestrogens will be a 6% risk reduction or less.  Perhaps more importantly, anti-estrogens also reduce the risk of a new breast cancer developing in either breast. For her that risk would be approximately 1% per year, dropping down to 1/2% per year with anti-estrogens.  We then discussed in detail the difference between tamoxifen and anastrozole. She is very well informed regarding the possible toxicities, side effects, and complications of these 2 types of medications. In DCIS, where there is no concern regarding survival, I would be comfortable with her receiving either of these medications for 5 years or alternatively she can take one of them for 2 years and the other for 3 years to "balance out the side effects".  Today she was unable to make a decision regarding which antiestrogen to choose. We have scheduled her for  a bone density and if significant osteopenia or osteoporosis is demonstrated, then starting with tamoxifen would  be reasonable. She will discuss this further with Dr. [name] at their next visit. I have not scheduled a return visit for Tinzlee here, but of course I will be glad to see her at any point in the at her or Dr. Rebecca Eaton discretion.  Zachary Lovins C    05/18/2012

## 2012-05-21 ENCOUNTER — Ambulatory Visit
Admission: RE | Admit: 2012-05-21 | Discharge: 2012-05-21 | Disposition: A | Discharge status: Home / Self Care | Payer: Managed Care, Other (non HMO) | Source: Ambulatory Visit | Attending: Radiation Oncology | Admitting: Radiation Oncology

## 2012-05-21 DIAGNOSIS — C50911 Malignant neoplasm of unspecified site of right female breast: Secondary | ICD-10-CM

## 2012-05-22 ENCOUNTER — Ambulatory Visit
Admission: RE | Admit: 2012-05-22 | Discharge: 2012-05-22 | Disposition: A | Discharge status: Home / Self Care | Payer: Managed Care, Other (non HMO) | Source: Ambulatory Visit | Attending: Oncology | Admitting: Oncology

## 2012-05-22 ENCOUNTER — Ambulatory Visit
Admission: RE | Admit: 2012-05-22 | Discharge: 2012-05-22 | Disposition: A | Discharge status: Home / Self Care | Payer: Managed Care, Other (non HMO) | Source: Ambulatory Visit | Attending: Radiation Oncology | Admitting: Radiation Oncology

## 2012-05-22 ENCOUNTER — Inpatient Hospital Stay
Admission: RE | Admit: 2012-05-22 | Discharge: 2012-05-22 | Disposition: A | Discharge status: Home / Self Care | Payer: Self-pay | Source: Ambulatory Visit | Attending: Radiation Oncology | Admitting: Radiation Oncology

## 2012-05-22 DIAGNOSIS — C50919 Malignant neoplasm of unspecified site of unspecified female breast: Secondary | ICD-10-CM

## 2012-05-22 DIAGNOSIS — C50411 Malignant neoplasm of upper-outer quadrant of right female breast: Secondary | ICD-10-CM

## 2012-05-22 MED ORDER — ALRA NON-METALLIC DEODORANT (RAD-ONC)
1.0000 "application " | Freq: Once | TOPICAL | Status: AC
  Administered 2012-05-22: 1 via TOPICAL

## 2012-05-22 MED ORDER — RADIAPLEXRX EX GEL
Freq: Once | CUTANEOUS | Status: AC
  Administered 2012-05-22: 1 via TOPICAL

## 2012-05-22 NOTE — Progress Notes (Signed)
Simulation verification note: The patient underwent similar to verification for treatment to her right breast. Her isocenter is in good position and the multileaf collimators contoured the treatment volume appropriately. 

## 2012-05-22 NOTE — Progress Notes (Signed)
Education given regarding management of side effects related to radiation to right breast.  Discussed management of skin care and fatigue.  Radiation Therapy and you booklet with appropriate pages marked and skin care handout given.  Given Radiaplex gel with instructions to use BID in the treatment site and apply after treatment and bedtime.  Aria deoderant also given with instructions to do a skin test before applying.  The patient was also reminded that she will see Dr. Dayton Scrape every Monday following treatment.

## 2012-05-22 NOTE — Addendum Note (Signed)
Encounter addended by: Eduardo Osier, RN on: 05/22/2012  2:08 PM<BR>     Documentation filed: Inpatient MAR, Orders

## 2012-05-23 ENCOUNTER — Ambulatory Visit
Admission: RE | Admit: 2012-05-23 | Discharge: 2012-05-23 | Disposition: A | Discharge status: Home / Self Care | Payer: Managed Care, Other (non HMO) | Source: Ambulatory Visit | Attending: Radiation Oncology | Admitting: Radiation Oncology

## 2012-05-23 ENCOUNTER — Encounter: Payer: Self-pay | Admitting: *Deleted

## 2012-05-23 NOTE — Progress Notes (Signed)
[facility] Psychosocial Distress Screening Clinical Social Work  Clinical Social Work was referred by distress screening protocol.  The patient scored a 5 on the Psychosocial Distress Thermometer which indicates moderate distress. Clinical Social Worker attempted to contact patient to assess for distress and other psychosocial needs. CSW left voicemail to return call when convenient.   Kathrin Penner, MSW, LCSW Clinical Social Worker [facility] [phone]

## 2012-05-26 ENCOUNTER — Ambulatory Visit
Admission: RE | Admit: 2012-05-26 | Discharge: 2012-05-26 | Disposition: A | Discharge status: Home / Self Care | Payer: Managed Care, Other (non HMO) | Source: Ambulatory Visit | Attending: Radiation Oncology | Admitting: Radiation Oncology

## 2012-05-27 ENCOUNTER — Ambulatory Visit: Payer: Managed Care, Other (non HMO)

## 2012-05-28 ENCOUNTER — Ambulatory Visit
Admission: RE | Admit: 2012-05-28 | Discharge: 2012-05-28 | Disposition: A | Discharge status: Home / Self Care | Payer: Managed Care, Other (non HMO) | Source: Ambulatory Visit | Attending: Radiation Oncology | Admitting: Radiation Oncology

## 2012-05-28 ENCOUNTER — Encounter: Payer: Self-pay | Admitting: Radiation Oncology

## 2012-05-28 NOTE — Progress Notes (Signed)
Weekly Management Note:  Site: Right breast Current Dose:  800  cGy Projected Dose: 5000   cGy  Narrative: The patient is seen today for routine under treatment assessment. CBCT/MVCT images/port films were reviewed. The chart was reviewed.   She is without complaints today. She denies skin changes. She has Radioplex gel to use when necessary.  Physical Examination: There were no vitals filed for this visit..  Weight:  . No skin changes.  Impression: Tolerating radiation therapy well.  Plan: Continue radiation therapy as planned.

## 2012-05-29 ENCOUNTER — Ambulatory Visit
Admission: RE | Admit: 2012-05-29 | Discharge: 2012-05-29 | Disposition: A | Discharge status: Home / Self Care | Payer: Managed Care, Other (non HMO) | Source: Ambulatory Visit | Attending: Radiation Oncology | Admitting: Radiation Oncology

## 2012-05-29 ENCOUNTER — Ambulatory Visit
Admission: RE | Admit: 2012-05-29 | Payer: Managed Care, Other (non HMO) | Source: Ambulatory Visit | Admitting: Radiation Oncology

## 2012-05-30 ENCOUNTER — Ambulatory Visit: Payer: Managed Care, Other (non HMO)

## 2012-06-02 ENCOUNTER — Encounter: Payer: Self-pay | Admitting: Radiation Oncology

## 2012-06-02 ENCOUNTER — Ambulatory Visit
Admission: RE | Admit: 2012-06-02 | Discharge: 2012-06-02 | Disposition: A | Discharge status: Home / Self Care | Payer: Managed Care, Other (non HMO) | Source: Ambulatory Visit | Attending: Radiation Oncology | Admitting: Radiation Oncology

## 2012-06-02 VITALS — BP 128/76 | HR 70 | Temp 97.8°F | Wt 156.0 lb

## 2012-06-02 DIAGNOSIS — C50911 Malignant neoplasm of unspecified site of right female breast: Secondary | ICD-10-CM

## 2012-06-02 NOTE — Addendum Note (Signed)
Encounter addended by: Eduardo Osier, RN on: 06/02/2012 12:44 PM<BR>     Documentation filed: Visit Diagnoses

## 2012-06-02 NOTE — Progress Notes (Signed)
[name] here for weekly ut check.  She has had 6/25 fractions to her right breast.  She denies pain and fatigue.  The skin on her right breast is intact although Ms. Lauritsen states that she is having some itching.  She is using the Radiaplex gel twice a day.

## 2012-06-02 NOTE — Progress Notes (Signed)
Weekly Management Note:  Site: Right breast Current Dose:  1200  cGy Projected Dose: 5000  CGy followed by right breast boost 1000 cGy 5 sessions  Narrative: The patient is seen today for routine under treatment assessment. CBCT/MVCT images/port films were reviewed. The chart was reviewed.   No complaints today. She uses Radioplex gel.  Physical Examination:  Filed Vitals:   06/02/12 0859  BP: 128/76  Pulse: 70  Temp: 97.8 F (36.6 C)  .  Weight: 156 lb (70.761 kg). Slight hyperpigmentation/erythema the skin along the right breast. No areas of desquamation.  Impression: Tolerating radiation therapy well.  Plan: Continue radiation therapy as planned.

## 2012-06-03 ENCOUNTER — Ambulatory Visit
Admission: RE | Admit: 2012-06-03 | Discharge: 2012-06-03 | Disposition: A | Discharge status: Home / Self Care | Payer: Managed Care, Other (non HMO) | Source: Ambulatory Visit | Attending: Radiation Oncology | Admitting: Radiation Oncology

## 2012-06-04 ENCOUNTER — Ambulatory Visit
Admission: RE | Admit: 2012-06-04 | Discharge: 2012-06-04 | Disposition: A | Discharge status: Home / Self Care | Payer: Managed Care, Other (non HMO) | Source: Ambulatory Visit | Attending: Radiation Oncology | Admitting: Radiation Oncology

## 2012-06-05 ENCOUNTER — Ambulatory Visit
Admission: RE | Admit: 2012-06-05 | Discharge: 2012-06-05 | Disposition: A | Discharge status: Home / Self Care | Payer: Managed Care, Other (non HMO) | Source: Ambulatory Visit | Attending: Radiation Oncology | Admitting: Radiation Oncology

## 2012-06-06 ENCOUNTER — Ambulatory Visit
Admission: RE | Admit: 2012-06-06 | Discharge: 2012-06-06 | Disposition: A | Discharge status: Home / Self Care | Payer: Managed Care, Other (non HMO) | Source: Ambulatory Visit | Attending: Radiation Oncology | Admitting: Radiation Oncology

## 2012-06-09 ENCOUNTER — Ambulatory Visit
Admission: RE | Admit: 2012-06-09 | Discharge: 2012-06-09 | Disposition: A | Discharge status: Home / Self Care | Payer: Managed Care, Other (non HMO) | Source: Ambulatory Visit | Attending: Radiation Oncology | Admitting: Radiation Oncology

## 2012-06-09 ENCOUNTER — Encounter: Payer: Self-pay | Admitting: Radiation Oncology

## 2012-06-09 VITALS — BP 116/62 | HR 74 | Temp 97.8°F | Resp 20 | Wt 157.6 lb

## 2012-06-09 DIAGNOSIS — C50911 Malignant neoplasm of unspecified site of right female breast: Secondary | ICD-10-CM

## 2012-06-09 MED ORDER — RADIAPLEXRX EX GEL
Freq: Once | CUTANEOUS | Status: AC
  Administered 2012-06-09: 09:00:00 via TOPICAL

## 2012-06-09 NOTE — Progress Notes (Signed)
   Weekly Management Note:  outpatient Current Dose:  22 Gy  Projected Dose: 60 Gy   Narrative:  The patient presents for routine under treatment assessment.  CBCT/MVCT images/Port film x-rays were reviewed.  The chart was checked. Doing well.  Physical Findings:  weight is 157 lb 9.6 oz (71.487 kg). Her oral temperature is 97.8 F (36.6 C). Her blood pressure is 116/62 and her pulse is 74. Her respiration is 20.  Slightly erythematous right breast  Impression:  The patient is tolerating radiotherapy.  Plan:  Continue radiotherapy as planned.  ________________________________   Lonie Peak, M.D.

## 2012-06-09 NOTE — Progress Notes (Signed)
patint here weekly rad tx right breast,completed 11 so far, very slight skin changes noted, gave another radiaplex gel per patient request, no pain, slight fatigue stated"It's the daylight savings time change" 8:37 AM

## 2012-06-10 ENCOUNTER — Ambulatory Visit
Admission: RE | Admit: 2012-06-10 | Discharge: 2012-06-10 | Disposition: A | Discharge status: Home / Self Care | Payer: Managed Care, Other (non HMO) | Source: Ambulatory Visit | Attending: Radiation Oncology | Admitting: Radiation Oncology

## 2012-06-11 ENCOUNTER — Ambulatory Visit
Admission: RE | Admit: 2012-06-11 | Discharge: 2012-06-11 | Disposition: A | Discharge status: Home / Self Care | Payer: Managed Care, Other (non HMO) | Source: Ambulatory Visit | Attending: Radiation Oncology | Admitting: Radiation Oncology

## 2012-06-12 ENCOUNTER — Telehealth: Payer: Self-pay | Admitting: *Deleted

## 2012-06-12 ENCOUNTER — Other Ambulatory Visit: Payer: Self-pay | Admitting: *Deleted

## 2012-06-12 ENCOUNTER — Ambulatory Visit
Admission: RE | Admit: 2012-06-12 | Discharge: 2012-06-12 | Disposition: A | Discharge status: Home / Self Care | Payer: Managed Care, Other (non HMO) | Source: Ambulatory Visit | Attending: Radiation Oncology | Admitting: Radiation Oncology

## 2012-06-12 NOTE — Telephone Encounter (Signed)
Pt is currently undergoing radiation and will be initiating tamoxifen when treatment ends. She is inquiring if she could see Dr [name] at later date to coordinate follow up for tamoxifen.  Pt will complete radiation 4/11 and would need follow up for toleration of tamoxifen late May.  Verba is currently scheduled to be seen by Dr [name] 4/3.  This RN will inquire with Dr MM's RN per above.

## 2012-06-13 ENCOUNTER — Ambulatory Visit
Admission: RE | Admit: 2012-06-13 | Discharge: 2012-06-13 | Disposition: A | Discharge status: Home / Self Care | Payer: Managed Care, Other (non HMO) | Source: Ambulatory Visit | Attending: Radiation Oncology | Admitting: Radiation Oncology

## 2012-06-16 ENCOUNTER — Ambulatory Visit
Admission: RE | Admit: 2012-06-16 | Discharge: 2012-06-16 | Disposition: A | Discharge status: Home / Self Care | Payer: Managed Care, Other (non HMO) | Source: Ambulatory Visit | Attending: Radiation Oncology | Admitting: Radiation Oncology

## 2012-06-16 VITALS — BP 121/59 | HR 71 | Temp 98.5°F | Wt 158.4 lb

## 2012-06-16 DIAGNOSIS — C50911 Malignant neoplasm of unspecified site of right female breast: Secondary | ICD-10-CM

## 2012-06-16 NOTE — Progress Notes (Signed)
Weekly Management Note:  Site: Right breast Current Dose:  3200  cGy Projected Dose: 5000  cGy followed by right breast boost  Narrative: The patient is seen today for routine under treatment assessment. CBCT/MVCT images/port films were reviewed. The chart was reviewed.   She does report some "soreness" along her lateral right breast and nipple. She uses Radioplex gel.  Physical Examination:  Filed Vitals:   06/16/12 0903  BP: 121/59  Pulse: 71  Temp: 98.5 F (36.9 C)  .  Weight: 158 lb 6.4 oz (71.85 kg). There is mild to moderate erythema of the skin along the lateral right breast/lower axilla and inframammary region. No areas of desquamation.  Impression: Tolerating radiation therapy well. I told she may use ibuprofen when necessary for right breast discomfort.  Plan: Continue radiation therapy as planned.

## 2012-06-16 NOTE — Progress Notes (Signed)
Ms. Wilcoxson here for weekly ut visit.  She has had 16/25 fractions to her right breast.  She denies pain and fatigue at this time.  She does have soreness and discomfort like chafing under her right arm and nipple.  She does have some erythema under her arm and on her breast.

## 2012-06-17 ENCOUNTER — Telehealth: Payer: Self-pay | Admitting: Internal Medicine

## 2012-06-17 ENCOUNTER — Ambulatory Visit
Admission: RE | Admit: 2012-06-17 | Discharge: 2012-06-17 | Disposition: A | Discharge status: Home / Self Care | Payer: Managed Care, Other (non HMO) | Source: Ambulatory Visit | Attending: Radiation Oncology | Admitting: Radiation Oncology

## 2012-06-17 NOTE — Telephone Encounter (Signed)
Per 3/20 pof r/s 4/3 appts to late May. S/w pt re appt for 5/29. Per pt request time changed to 8:30am lb and 9 am MM

## 2012-06-18 ENCOUNTER — Ambulatory Visit
Admission: RE | Admit: 2012-06-18 | Discharge: 2012-06-18 | Disposition: A | Discharge status: Home / Self Care | Payer: Managed Care, Other (non HMO) | Source: Ambulatory Visit | Attending: Radiation Oncology | Admitting: Radiation Oncology

## 2012-06-19 ENCOUNTER — Ambulatory Visit
Admission: RE | Admit: 2012-06-19 | Discharge: 2012-06-19 | Disposition: A | Discharge status: Home / Self Care | Payer: Managed Care, Other (non HMO) | Source: Ambulatory Visit | Attending: Radiation Oncology | Admitting: Radiation Oncology

## 2012-06-19 ENCOUNTER — Encounter: Payer: Self-pay | Admitting: Radiation Oncology

## 2012-06-19 NOTE — Progress Notes (Signed)
Electron beam simulation note: The patient underwent virtual simulation for her right breast electron beam boost on 06/18/2012. She was set up en face. One custom block was constructed to shield the normal shredding structures. A special port plan was requested. I prescribed 1000 cGy 5 sessions utilizing 15 MEV electrons. Electron beam energy was chosen based on the depth of her tumor bed as seen on her CT scan.

## 2012-06-20 ENCOUNTER — Ambulatory Visit
Admission: RE | Admit: 2012-06-20 | Discharge: 2012-06-20 | Disposition: A | Discharge status: Home / Self Care | Payer: Managed Care, Other (non HMO) | Source: Ambulatory Visit | Attending: Radiation Oncology | Admitting: Radiation Oncology

## 2012-06-23 ENCOUNTER — Ambulatory Visit
Admission: RE | Admit: 2012-06-23 | Discharge: 2012-06-23 | Disposition: A | Discharge status: Home / Self Care | Payer: Managed Care, Other (non HMO) | Source: Ambulatory Visit | Attending: Radiation Oncology | Admitting: Radiation Oncology

## 2012-06-24 ENCOUNTER — Ambulatory Visit
Admission: RE | Admit: 2012-06-24 | Discharge: 2012-06-24 | Disposition: A | Discharge status: Home / Self Care | Payer: Managed Care, Other (non HMO) | Source: Ambulatory Visit | Attending: Radiation Oncology | Admitting: Radiation Oncology

## 2012-06-24 ENCOUNTER — Encounter: Payer: Self-pay | Admitting: Radiation Oncology

## 2012-06-24 NOTE — Progress Notes (Signed)
Weekly Management Note:  Site: Right breast Current Dose:  4400  cGy Projected Dose: 6000  CGy including right breast  Narrative: The patient is seen today for routine under treatment assessment. CBCT/MVCT images/port films were reviewed. The chart was reviewed.   No complaints today. She continues with Radioplex gel. She does take ibuprofen when necessary for right breast discomfort.  Physical Examination: There were no vitals filed for this visit..  Weight:  . Hyperpigmentation/erythema the skin with no areas of moist desquamation.  Impression: Tolerating radiation therapy well.  Plan: Continue radiation therapy as planned.

## 2012-06-25 ENCOUNTER — Ambulatory Visit
Admission: RE | Admit: 2012-06-25 | Discharge: 2012-06-25 | Disposition: A | Discharge status: Home / Self Care | Payer: Managed Care, Other (non HMO) | Source: Ambulatory Visit | Attending: Radiation Oncology | Admitting: Radiation Oncology

## 2012-06-26 ENCOUNTER — Ambulatory Visit
Admission: RE | Admit: 2012-06-26 | Discharge: 2012-06-26 | Disposition: A | Discharge status: Home / Self Care | Payer: Managed Care, Other (non HMO) | Source: Ambulatory Visit | Attending: Radiation Oncology | Admitting: Radiation Oncology

## 2012-06-26 ENCOUNTER — Ambulatory Visit: Payer: Managed Care, Other (non HMO) | Admitting: Internal Medicine

## 2012-06-26 ENCOUNTER — Other Ambulatory Visit: Payer: Managed Care, Other (non HMO) | Admitting: Lab

## 2012-06-27 ENCOUNTER — Ambulatory Visit
Admission: RE | Admit: 2012-06-27 | Discharge: 2012-06-27 | Disposition: A | Discharge status: Home / Self Care | Payer: Managed Care, Other (non HMO) | Source: Ambulatory Visit | Attending: Radiation Oncology | Admitting: Radiation Oncology

## 2012-06-30 ENCOUNTER — Ambulatory Visit
Admission: RE | Admit: 2012-06-30 | Discharge: 2012-06-30 | Disposition: A | Discharge status: Home / Self Care | Payer: Managed Care, Other (non HMO) | Source: Ambulatory Visit | Attending: Radiation Oncology | Admitting: Radiation Oncology

## 2012-07-01 ENCOUNTER — Ambulatory Visit
Admission: RE | Admit: 2012-07-01 | Discharge: 2012-07-01 | Disposition: A | Discharge status: Home / Self Care | Payer: Managed Care, Other (non HMO) | Source: Ambulatory Visit | Attending: Radiation Oncology | Admitting: Radiation Oncology

## 2012-07-01 VITALS — BP 120/64 | HR 66 | Temp 98.4°F | Resp 20 | Wt 159.0 lb

## 2012-07-01 DIAGNOSIS — C50911 Malignant neoplasm of unspecified site of right female breast: Secondary | ICD-10-CM

## 2012-07-01 MED ORDER — RADIAPLEXRX EX GEL
Freq: Once | CUTANEOUS | Status: AC
  Administered 2012-07-01: 09:00:00 via TOPICAL

## 2012-07-01 NOTE — Progress Notes (Signed)
Weekly rad tx right breast, erythema, peeiing under inframmary fold, itches some, , zap feelings in breast  Occasionally, smay need neosporin where breast is peekling, uses radiaplex gel 2-3x day  8:37 AM

## 2012-07-01 NOTE — Progress Notes (Signed)
Weekly Management Note:  Site: Right breast Current Dose:  5400  cGy Projected Dose: 6000  cGy  Narrative: The patient is seen today for routine under treatment assessment. CBCT/MVCT images/port films were reviewed. The chart was reviewed.   She is currently receiving her right breast boost. She uses Radioplex gel.  Physical Examination:  Filed Vitals:   07/01/12 0839  BP: 120/64  Pulse: 66  Temp: 98.4 F (36.9 C)  Resp: 20  .  Weight: 159 lb (72.122 kg). There is moderate erythema and hyperpigmentation the skin along the right breast. There is dry desquamation along the inframammary region.  Impression: Tolerating radiation therapy well. She'll finish her treatment this Friday.  Plan: Continue radiation therapy as planned. One-month followup after completion of radiation therapy.

## 2012-07-02 ENCOUNTER — Ambulatory Visit
Admission: RE | Admit: 2012-07-02 | Discharge: 2012-07-02 | Disposition: A | Discharge status: Home / Self Care | Payer: Managed Care, Other (non HMO) | Source: Ambulatory Visit | Attending: Radiation Oncology | Admitting: Radiation Oncology

## 2012-07-03 ENCOUNTER — Ambulatory Visit: Payer: Managed Care, Other (non HMO)

## 2012-07-03 ENCOUNTER — Ambulatory Visit
Admission: RE | Admit: 2012-07-03 | Discharge: 2012-07-03 | Disposition: A | Discharge status: Home / Self Care | Payer: Managed Care, Other (non HMO) | Source: Ambulatory Visit | Attending: Radiation Oncology | Admitting: Radiation Oncology

## 2012-07-04 ENCOUNTER — Ambulatory Visit: Payer: Managed Care, Other (non HMO)

## 2012-07-04 ENCOUNTER — Ambulatory Visit
Admission: RE | Admit: 2012-07-04 | Discharge: 2012-07-04 | Disposition: A | Discharge status: Home / Self Care | Payer: Managed Care, Other (non HMO) | Source: Ambulatory Visit | Attending: Radiation Oncology | Admitting: Radiation Oncology

## 2012-07-07 ENCOUNTER — Encounter: Payer: Self-pay | Admitting: Radiation Oncology

## 2012-07-07 ENCOUNTER — Ambulatory Visit: Payer: Managed Care, Other (non HMO)

## 2012-07-07 NOTE — Progress Notes (Signed)
[facility] Radiation Oncology End of Treatment Note  Name:Malu DEJAH DROESSLER  Date: 07/07/2012 OZH:[id number] DOB:Dec 26, 1950   Status:outpatient    CC: [name], MD  Dr. Harriette Bouillon  REFERRING PHYSICIAN:   Dr. Harriette Bouillon   DIAGNOSIS:  Stage 0 (Tis N0 M0) multifocal DCIS of the right breast  INDICATION FOR TREATMENT: Curative   TREATMENT DATES: 05/22/2012 through 07/04/2012                          SITE/DOSE:  Right breast 5000 cGy 25 sessions right breast boost 1000 cGy 5 sessions                          BEAMS/ENERGY:    Tangential fields to the right breast, 6 MV photons. Electron beam boost   15 MEV electrons.            NARRATIVE:    Ms. Biggers tolerated treatment well  with the expected degree of radiation dermatitis/hyperpigmentation the skin by completion of therapy. She had dry desquamation but no areas of moist desquamation. She uses Radioplex gel when necessary.                      PLAN: Routine followup in one month. Patient instructed to call if questions or worsening complaints in interim.

## 2012-07-21 ENCOUNTER — Ambulatory Visit (INDEPENDENT_AMBULATORY_CARE_PROVIDER_SITE_OTHER): Payer: Managed Care, Other (non HMO) | Admitting: Surgery

## 2012-07-21 ENCOUNTER — Encounter (INDEPENDENT_AMBULATORY_CARE_PROVIDER_SITE_OTHER): Payer: Self-pay | Admitting: Surgery

## 2012-07-21 VITALS — BP 130/72 | HR 69 | Temp 97.1°F | Resp 18 | Ht 64.0 in | Wt 159.6 lb

## 2012-07-21 DIAGNOSIS — Z853 Personal history of malignant neoplasm of breast: Secondary | ICD-10-CM

## 2012-07-21 NOTE — Patient Instructions (Signed)
Return as needed

## 2012-07-21 NOTE — Progress Notes (Signed)
NAME: ADELENE POLIVKA       DOB: February 17, 1951           DATE: 07/21/2012       MRN: [id number]  CC:   Chief Complaint  Patient presents with  . Routine Post Op    3 mo br reck    TESSY PAWELSKI is a 62 y.o.Marland Kitchenfemale who presents for routine followup of her Right breast DCIS diagnosed in 1/ 2104 and treated with LUMPECTOMY AND RADIATION . She has no problems or concerns on either side.  PFSH: She has had no significant changes since the last visit here.  ROS: There have been no significant changes since the last visit here  EXAM:  VS: BP 130/72  Pulse 69  Temp(Src) 97.1 F (36.2 C) (Temporal)  Resp 18  Ht 5\' 4"  (1.626 m)  Wt 159 lb 9.6 oz (72.394 kg)  BMI 27.38 kg/m2  General: The patient is alert, oriented, generally healthy appearing, NAD. Mood and affect are normal.  Breasts:   Right breast shows post radiation changes.  Incision clean dry intact.  No infection. Lymphatics: She has no axillary or supraclavicular adenopathy on either side.  Extremities: Full ROM of the surgical side with no lymphedema noted.  Data Reviewed: On tamoxifen  Impression: Doing well, with no evidence of recurrent cancer or new cancer  Plan: Will continue to follow up on an as needed basis here.

## 2012-07-24 HISTORY — PX: MOUTH SURGERY: SHX715

## 2012-08-06 ENCOUNTER — Encounter: Payer: Self-pay | Admitting: Radiation Oncology

## 2012-08-13 ENCOUNTER — Ambulatory Visit: Payer: Managed Care, Other (non HMO) | Admitting: Radiation Oncology

## 2012-08-21 ENCOUNTER — Other Ambulatory Visit: Payer: Self-pay | Admitting: Medical Oncology

## 2012-08-21 ENCOUNTER — Encounter: Payer: Self-pay | Admitting: Internal Medicine

## 2012-08-21 ENCOUNTER — Telehealth: Payer: Self-pay | Admitting: Internal Medicine

## 2012-08-21 ENCOUNTER — Other Ambulatory Visit: Payer: Managed Care, Other (non HMO) | Admitting: Lab

## 2012-08-21 ENCOUNTER — Ambulatory Visit (HOSPITAL_BASED_OUTPATIENT_CLINIC_OR_DEPARTMENT_OTHER): Payer: Managed Care, Other (non HMO) | Admitting: Internal Medicine

## 2012-08-21 VITALS — BP 129/75 | HR 67 | Temp 97.2°F | Resp 18 | Ht 64.0 in | Wt 153.6 lb

## 2012-08-21 DIAGNOSIS — D0591 Unspecified type of carcinoma in situ of right breast: Secondary | ICD-10-CM

## 2012-08-21 DIAGNOSIS — D0590 Unspecified type of carcinoma in situ of unspecified breast: Secondary | ICD-10-CM

## 2012-08-21 NOTE — Telephone Encounter (Signed)
Gave pt appt for lab and MD on December 2014 , pt reminded of her x-ray before MD visit

## 2012-08-21 NOTE — Progress Notes (Signed)
[name] [phone]   [phone]  OFFICE PROGRESS NOTE  [name], MD [facility] [address] [place] [address]  DIAGNOSIS:  1) stage II a non-small cell lung cancer diagnosed in March of 2006 2) ductal carcinoma in situ of the right breast diagnosed in January of 2014  PRIOR THERAPY: 1) status post left trisegmentectomy with node dissection on 06/24/2004 2) status post 4 cycles of adjuvant chemotherapy with carboplatin and paclitaxel last dose was given 10/24/2004. 3) status post right partial mastectomy on 04/10/2012 under the care of Dr. [name]. 4) status post adjuvant radiotherapy to the right breast under the care of Dr. Dayton Scrape  CURRENT THERAPY: Tamoxifen 20 mg by mouth daily started 6 weeks ago   INTERVAL HISTORY: [name] 62 y.o. female returns to the clinic today for followup visit. The patient is feeling fine today with no specific complaints. She completed a course of radiotherapy to the right breast under the care of Dr. Dayton Scrape. She was started on treatment with tamoxifen 6 weeks ago. The patient is tolerating it fairly well with no significant adverse effects. She denied having any uterine bleeding. She has no chest pain, shortness breath, cough or hemoptysis.  MEDICAL HISTORY: Past Medical History  Diagnosis Date  . Anemia   . Arthritis   . Thyroid disease   . Cancer 2006    lung  . Ductal carcinoma in situ of right breast diagnosed in December of 2013, status post right lumpectomy in January of 2014 04/28/2012  . Breast cancer 03/06/12    dx Right  mass bx=11 o'clock=atypical ductal hyperplasia   . Breast cancer 04/10/12    right lumpectomy=Multifocal interm.grade ductal cqrcinom in situ w/necrosis  ER/PR=+  . Allergy   . Lung cancer, diagnosed in March of 2006 stage II a status post left trisegmentectomy with lymph node dissection followed by 4 cycles of adjuvant  chemotherapy     left upper /surgery and chemo  . Radiation 07/03/12-07/09/12    Right upper lung 5400 cGy    ALLERGIES:  is allergic to penicillins; adhesive; amoxicillin; asa; banana; ceftin; lactose intolerance (gi); milk-related compounds; and orange fruit.  MEDICATIONS:  Current Outpatient Prescriptions  Medication Sig Dispense Refill  . Ascorbic Acid (VITAMIN C PO) Take 500 mg by mouth as needed.      . chlorhexidine (PERIDEX) 0.12 % solution 0.5 mLs 2 (two) times daily.      . cycloSPORINE (RESTASIS) 0.05 % ophthalmic emulsion 1 drop 2 (two) times daily.      Marland Kitchen ECHINACEA PO Take by mouth as needed.      . flurbiprofen (ANSAID) 100 MG tablet Take 1 tablet by mouth.      . fluticasone (FLONASE) 50 MCG/ACT nasal spray       . guaifenesin (HUMIBID E) 400 MG TABS Take 400 mg by mouth as needed.      . hyaluronate sodium (RADIAPLEXRX) GEL Apply topically 2 (two) times daily.      Marland Kitchen HYDROcodone-acetaminophen (NORCO/VICODIN) 5-325 MG per tablet 0.5 tablets every 6 (six) hours as needed.      Marland Kitchen PATANOL 0.1 % ophthalmic solution Place into both eyes as needed.       Bertram Gala Glycol-Propyl Glycol (SYSTANE OP) Apply 1 drop to eye as needed.       . Probiotic Product (PROBIOTIC & ACIDOPHILUS EX ST PO) Take 1 capsule by mouth daily.      Marland Kitchen  tamoxifen (NOLVADEX) 20 MG tablet Take 1 tablet (20 mg total) by mouth daily.  90 tablet  12  . Zinc 50 MG TABS Take by mouth as needed.      . Multiple Vitamin (MULTIVITAMIN) tablet Take 1 tablet by mouth daily.      . pseudoephedrine (SUDAFED) 30 MG tablet Take 30 mg by mouth as needed for congestion.       No current facility-administered medications for this visit.    SURGICAL HISTORY:  Past Surgical History  Procedure Laterality Date  . Lung removal, partial    . Wisdom tooth extraction    . Finger surgery    . Nasal sinus surgery    . Video assisted thoracoscopy (vats)/ lobectomy  2006    left upper lobe  . Trigger finger release  2004,2001      multiple-lt and rt hands  . Nasal sinus surgery      multiple sinus surg  . Appendectomy  1971  . Partial mastectomy with needle localization  04/10/2012    Procedure: PARTIAL MASTECTOMY WITH NEEDLE LOCALIZATION;  Surgeon: Maisie Fus A. Cornett, MD;  Location: [facility];  Service: General;  Laterality: Right;  . Breast surgery    . Mouth surgery Right may 2014    cyst    REVIEW OF SYSTEMS:  A comprehensive review of systems was negative.   PHYSICAL EXAMINATION: General appearance: alert, cooperative and no distress Head: Normocephalic, without obvious abnormality, atraumatic Neck: no adenopathy Lymph nodes: Cervical, supraclavicular, and axillary nodes normal. Resp: clear to auscultation bilaterally Cardio: regular rate and rhythm, S1, S2 normal, no murmur, click, rub or gallop GI: soft, non-tender; bowel sounds normal; no masses,  no organomegaly Extremities: extremities normal, atraumatic, no cyanosis or edema  ECOG PERFORMANCE STATUS: 0 - Asymptomatic  Blood pressure 129/75, pulse 67, temperature 97.2 F (36.2 C), temperature source Oral, resp. rate 18, height 5\' 4"  (1.626 m), weight 153 lb 9.6 oz (69.673 kg).  LABORATORY DATA: Lab Results  Component Value Date   WBC 6.4 04/28/2012   HGB 14.3 04/28/2012   HCT 40.9 04/28/2012   MCV 89.5 04/28/2012   PLT 363 04/28/2012      Chemistry      Component Value Date/Time   NA 143 04/28/2012 1008   NA 140 04/08/2012 0900   K 4.3 04/28/2012 1008   K 4.1 04/08/2012 0900   CL 108* 04/28/2012 1008   CL 103 04/08/2012 0900   CO2 25 04/28/2012 1008   CO2 26 04/08/2012 0900   BUN 13.0 04/28/2012 1008   BUN 18 04/08/2012 0900   CREATININE 0.8 04/28/2012 1008   CREATININE 0.74 04/08/2012 0900      Component Value Date/Time   CALCIUM 9.1 04/28/2012 1008   CALCIUM 9.4 04/08/2012 0900   ALKPHOS 80 04/28/2012 1008   ALKPHOS 83 04/08/2012 0900   AST 18 04/28/2012 1008   AST 21 04/08/2012 0900   ALT 17 04/28/2012 1008   ALT 16 04/08/2012 0900    BILITOT 0.38 04/28/2012 1008   BILITOT 0.3 04/08/2012 0900       RADIOGRAPHIC STUDIES: No results found.  ASSESSMENT: This is a very pleasant 62 years old white female with history of stage II a non-small cell lung cancer as well as recently diagnosed right breast carcinoma.   PLAN: The patient is currently on treatment with tamoxifen and tolerating it well. I recommended for her to continue with the same treatment for now. I would see her back for  followup visit in 6 months with repeat CBC, comprehensive metabolic panel, CA 27.29 as well as chest x-ray.  All questions were answered. The patient knows to call the clinic with any problems, questions or concerns. We can certainly see the patient much sooner if necessary.

## 2012-10-13 ENCOUNTER — Encounter: Payer: Self-pay | Admitting: Gastroenterology

## 2012-10-15 ENCOUNTER — Encounter: Payer: Self-pay | Admitting: Gastroenterology

## 2013-01-27 ENCOUNTER — Other Ambulatory Visit: Payer: Self-pay | Admitting: Internal Medicine

## 2013-01-27 DIAGNOSIS — Z853 Personal history of malignant neoplasm of breast: Secondary | ICD-10-CM

## 2013-01-29 ENCOUNTER — Other Ambulatory Visit: Payer: Self-pay

## 2013-02-11 ENCOUNTER — Ambulatory Visit
Admission: RE | Admit: 2013-02-11 | Discharge: 2013-02-11 | Disposition: A | Discharge status: Home / Self Care | Payer: 59 | Source: Ambulatory Visit | Attending: Internal Medicine | Admitting: Internal Medicine

## 2013-02-11 ENCOUNTER — Other Ambulatory Visit: Payer: Self-pay | Admitting: Internal Medicine

## 2013-02-11 DIAGNOSIS — Z853 Personal history of malignant neoplasm of breast: Secondary | ICD-10-CM

## 2013-02-12 ENCOUNTER — Ambulatory Visit
Admission: RE | Admit: 2013-02-12 | Discharge: 2013-02-12 | Disposition: A | Discharge status: Home / Self Care | Payer: 59 | Source: Ambulatory Visit | Attending: Internal Medicine | Admitting: Internal Medicine

## 2013-02-12 DIAGNOSIS — Z853 Personal history of malignant neoplasm of breast: Secondary | ICD-10-CM

## 2013-02-12 IMAGING — MG MM RT BREAST BX W LOC DEV 1ST LESION IMAGE BX SPEC STEREO GUIDE
3 series · 3 of 3 positions shown · non-contrast
Comparison: Previous exams.

ADDENDUM:
Final pathology results of a right breast stereotactic biopsy show
ductal carcinoma in situ with calcifications. Pathology results are
concordant with imaging findings. The patient has previously been
treated for right breast DCIS with lumpectomy and radiation in early
[KF].

Pathology results were discussed with the patient by telephone today
[DATE]. Our office scheduled her a follow-up appointment with
Dr. JURISTO on [DATE]. The patient states that she will likely
reschedule the appointment after she has met with doctor JURISTO on
[DATE].
She reports no problems following the biopsy.
CLINICAL DATA: Personal history of DCIS status post right
lumpectomy in [DATE] with subsequent radiation therapy
JURISTO through [DATE]. The patient presents for
stereotactic biopsy of calcifications in the lumpectomy site
following recent diagnostic evaluation on [DATE].
EXAM:
STEREOTACTIC VACUUM ASSIST RIGHT

[R CC]
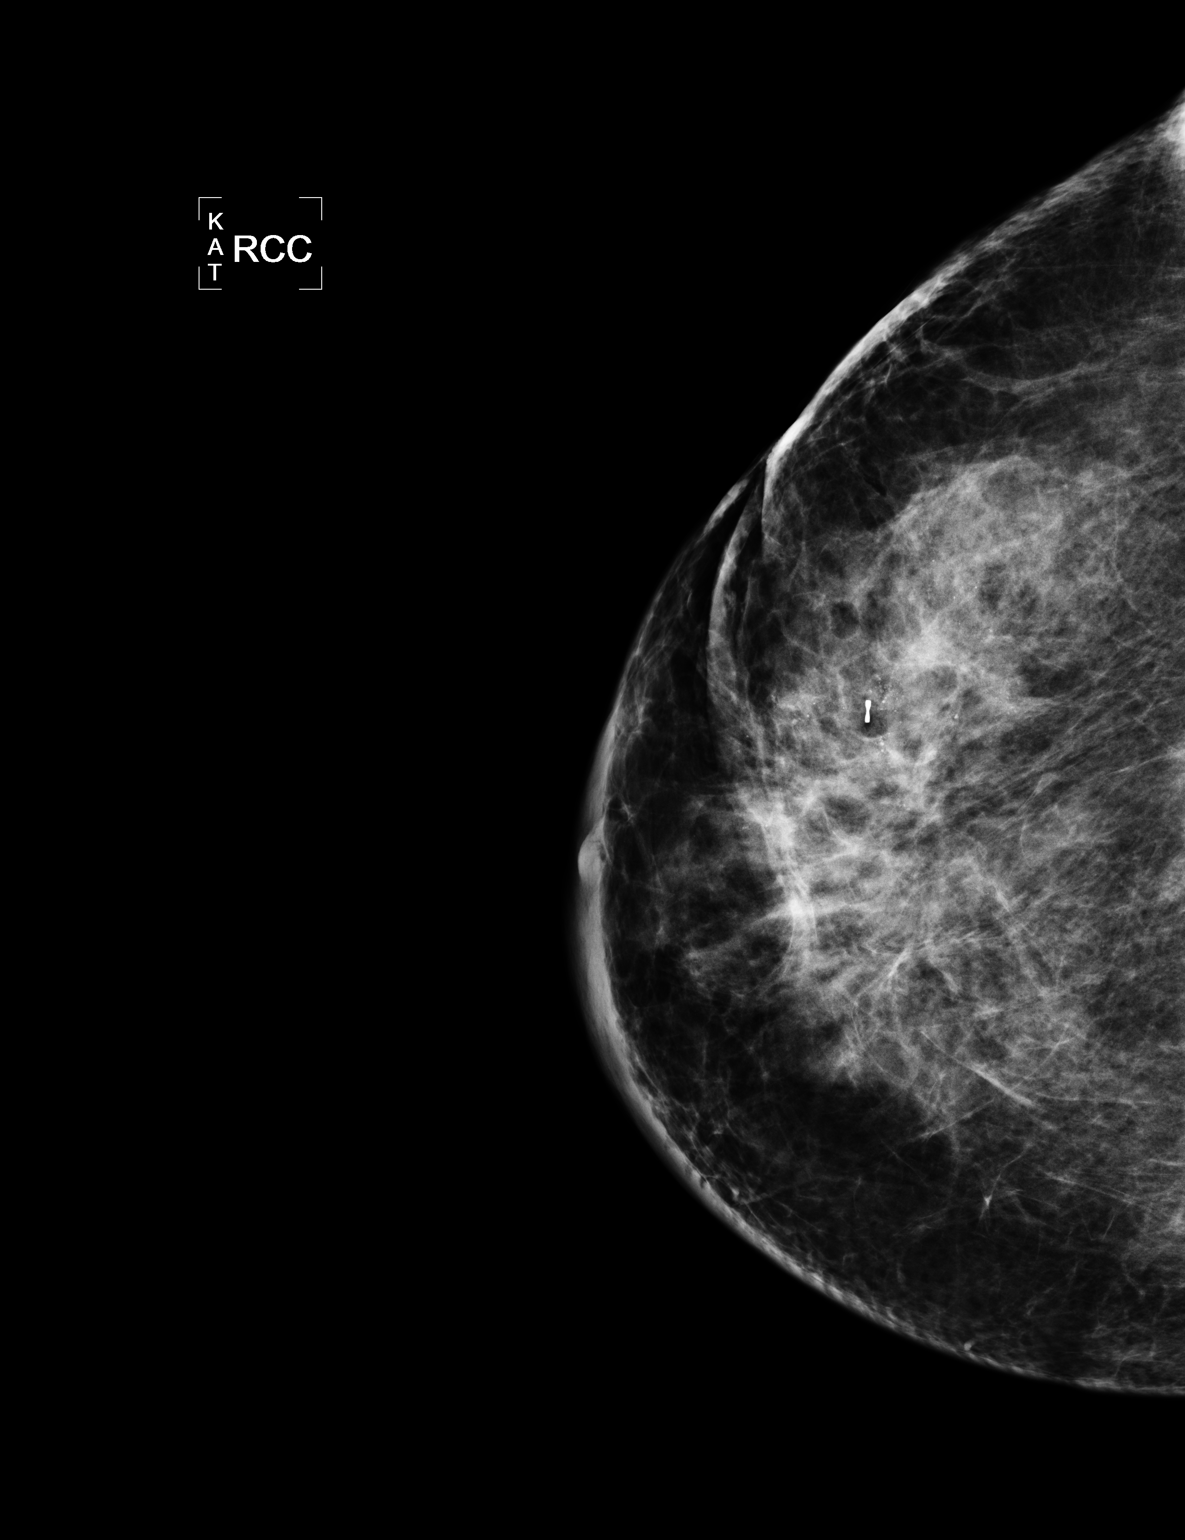

[R ML]
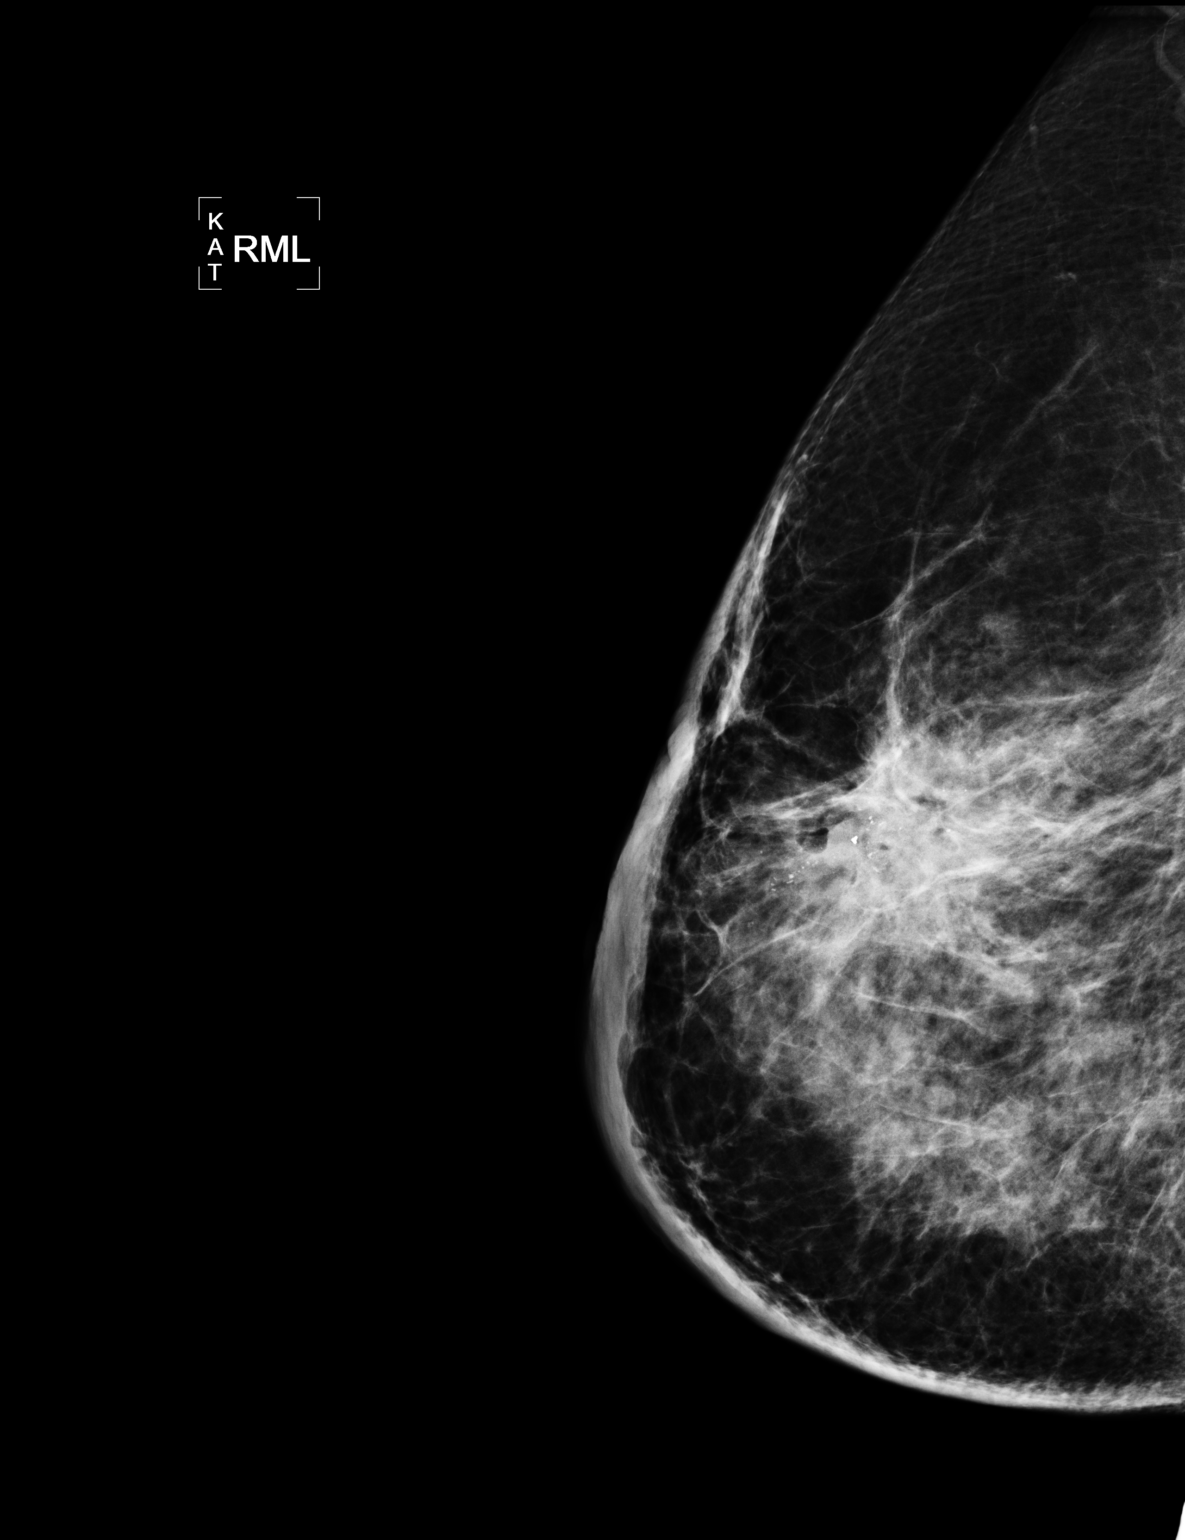

[R]
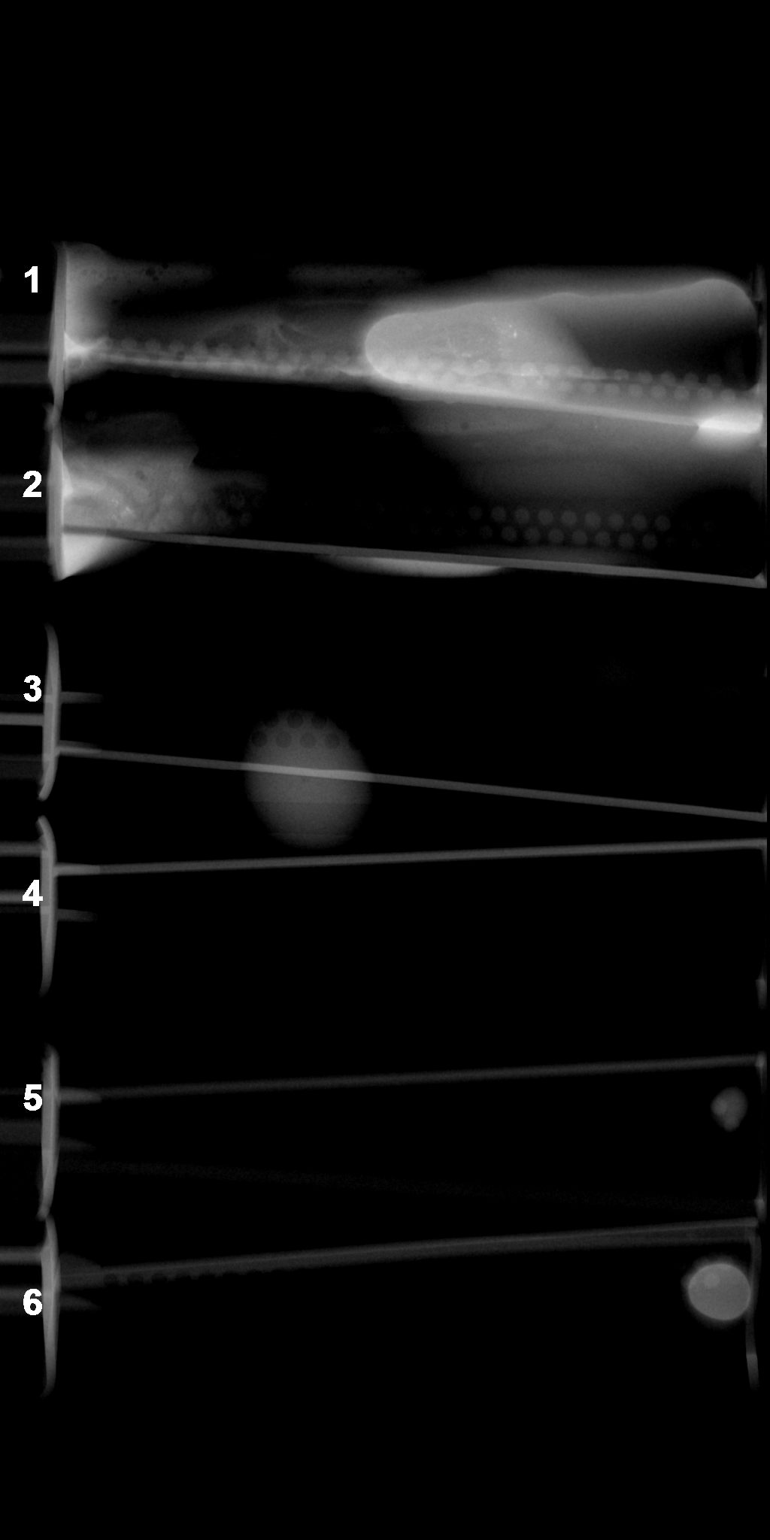

[3 of 3 positions shown; findings below may reference images not displayed]

FINDINGS: The patient and I discussed the procedure of stereotactic-guided
biopsy including benefits and alternatives. We discussed the high
likelihood of a successful procedure. We discussed the risks of the
procedure including infection, bleeding, tissue injury, clip
migration, and inadequate sampling. Informed written consent was
given. The usual time out protocol was peformed immediately prior to
the procedure.

Using sterile technique and 2% Lidocaine as local anesthetic, under
stereotactic guidance, a 10 gauge mammotome device was used to
perform core needle biopsy of calcifications in the upper-outer
portion of the right breast using a lateral medial approach.
Specimen radiograph was performed showing calcifications in the
majority of the acquired specimens. Specimens with calcifications
are identified for pathology.

At the conclusion of the procedure a dumbbell shaped tissue marker
clip was deployed into the biopsy cavity. Follow-up 2-view mammogram
confirmed clip position to be located within the visualized biopsy
cavity.
IMPRESSION: Stereotactic-guided biopsy of right breast calcifications as
described above. No apparent complications.

## 2013-02-16 ENCOUNTER — Other Ambulatory Visit: Payer: Self-pay | Admitting: *Deleted

## 2013-02-16 DIAGNOSIS — C50919 Malignant neoplasm of unspecified site of unspecified female breast: Secondary | ICD-10-CM

## 2013-02-17 ENCOUNTER — Telehealth: Payer: Self-pay | Admitting: *Deleted

## 2013-02-17 NOTE — Telephone Encounter (Signed)
Late Entry 02/16/13 at 1700:  Patient called stating that she got her mammogram and biopsy results back and she has recurrence of DCIS.  Dr Donnald Garre informed.  Pt will keep appts as already scheduled for next week.  SLJ

## 2013-02-20 ENCOUNTER — Ambulatory Visit: Payer: Managed Care, Other (non HMO) | Admitting: Oncology

## 2013-02-22 ENCOUNTER — Ambulatory Visit (HOSPITAL_COMMUNITY)
Admission: RE | Admit: 2013-02-22 | Discharge: 2013-02-22 | Disposition: A | Discharge status: Home / Self Care | Payer: 59 | Source: Ambulatory Visit | Attending: Internal Medicine | Admitting: Internal Medicine

## 2013-02-22 DIAGNOSIS — C50919 Malignant neoplasm of unspecified site of unspecified female breast: Secondary | ICD-10-CM | POA: Diagnosis present

## 2013-02-22 DIAGNOSIS — Z85118 Personal history of other malignant neoplasm of bronchus and lung: Secondary | ICD-10-CM | POA: Insufficient documentation

## 2013-02-22 IMAGING — CR DG CHEST 1V
1 series · 1 of 1 positions shown · non-contrast
Comparison: Chest CT [DATE] and chest radiograph [DATE]

CLINICAL DATA: Restaging breast cancer. Prior history of lung
cancer.

EXAM:
CHEST - 1 VIEW

[w chest pa *]
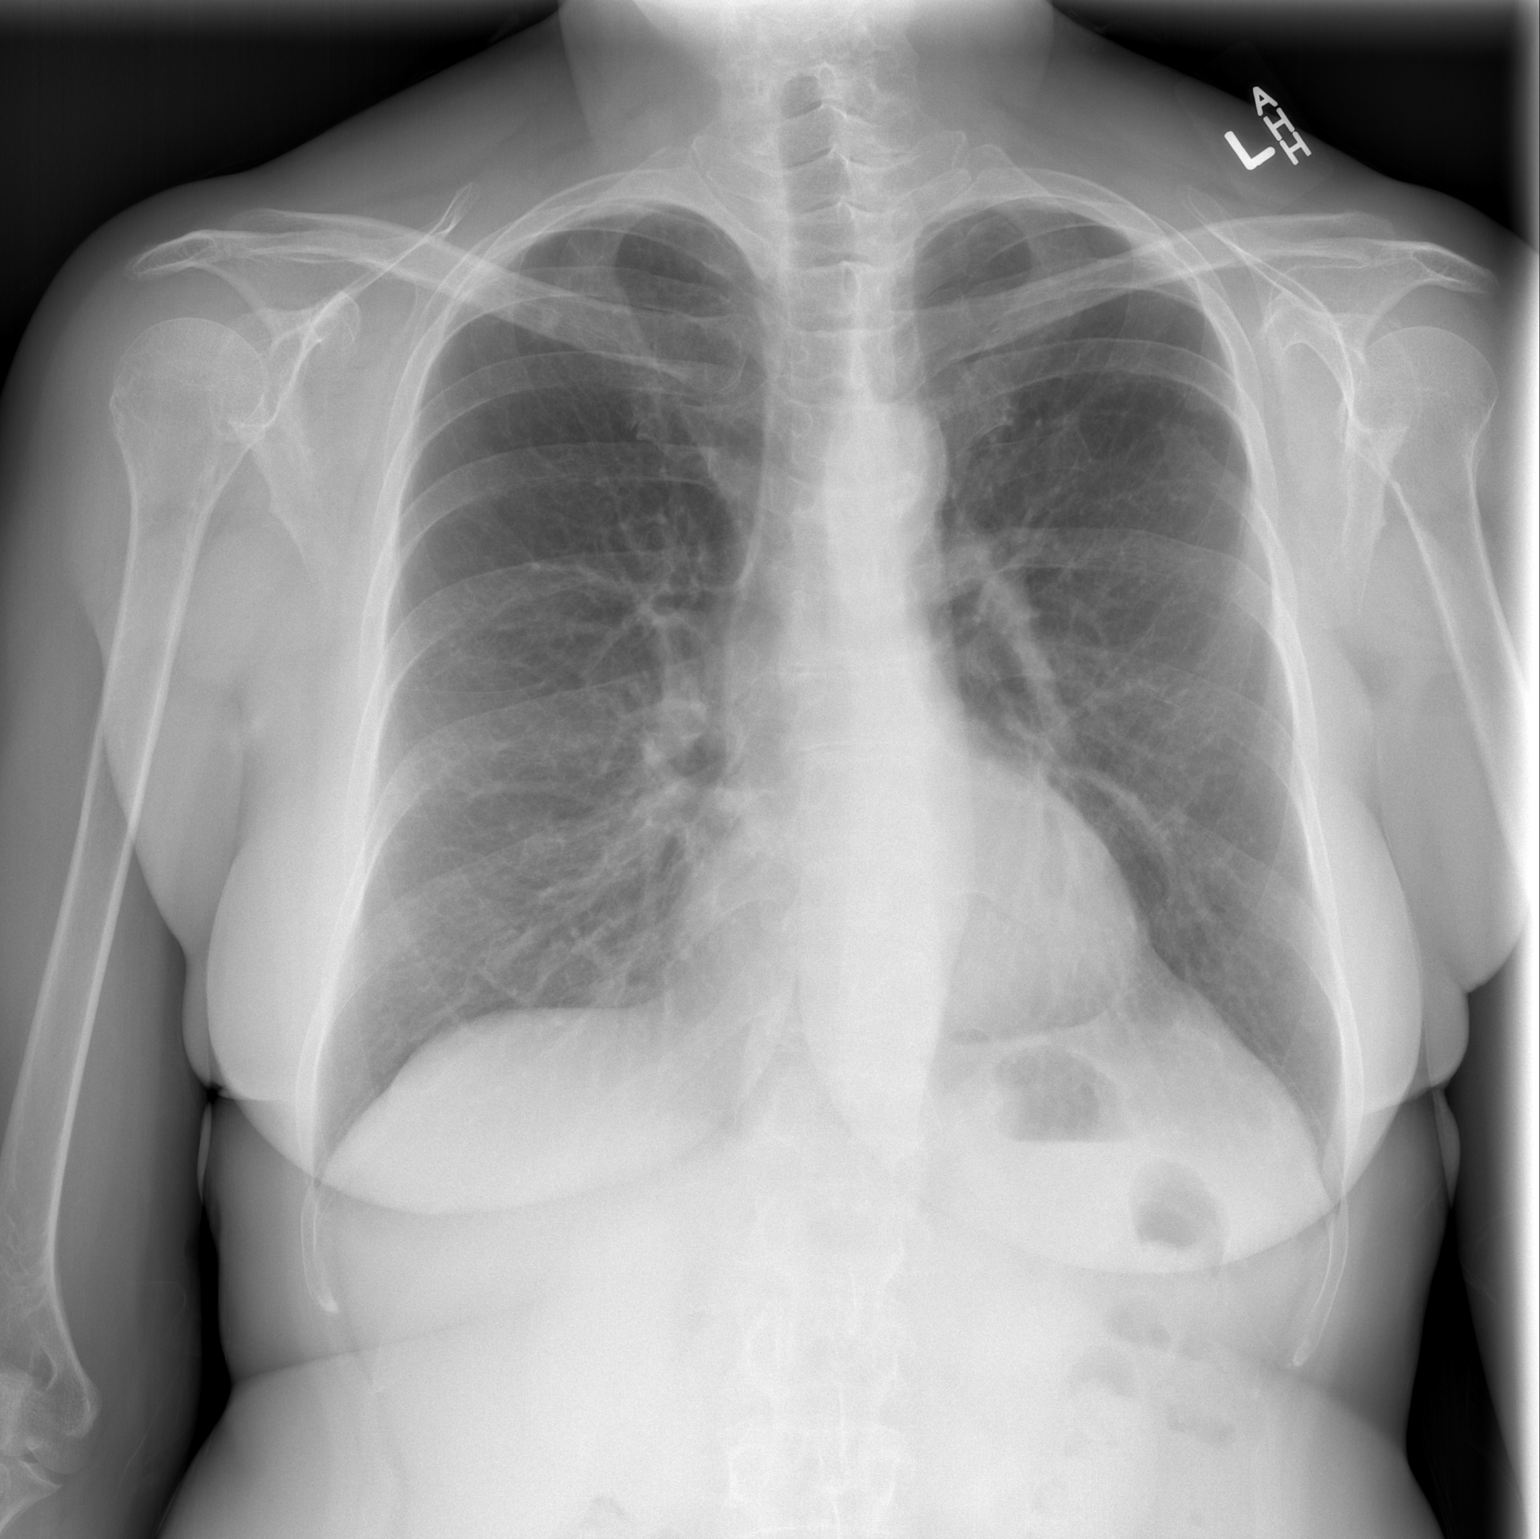

[1 of 1 positions shown; findings below may reference images not displayed]

FINDINGS: Heart mediastinal contours are normal. Sub-cm nodular density at the
right lung apex appears unchanged from chest radiograph of [5Y] and
chest CT [5Y]. Postoperative changes noted on the left, with chronic
tenting of the left hemidiaphragm. No airspace disease, mass lesion,
or pleural effusion is identified. No acute bony abnormality.
IMPRESSION: Stable chest radiograph.  No acute findings identified.

## 2013-02-25 ENCOUNTER — Encounter: Payer: Self-pay | Admitting: Internal Medicine

## 2013-02-25 ENCOUNTER — Other Ambulatory Visit (HOSPITAL_COMMUNITY): Payer: Managed Care, Other (non HMO)

## 2013-02-25 ENCOUNTER — Ambulatory Visit (HOSPITAL_BASED_OUTPATIENT_CLINIC_OR_DEPARTMENT_OTHER): Payer: 59 | Admitting: Internal Medicine

## 2013-02-25 ENCOUNTER — Other Ambulatory Visit (HOSPITAL_BASED_OUTPATIENT_CLINIC_OR_DEPARTMENT_OTHER): Payer: 59 | Admitting: Lab

## 2013-02-25 ENCOUNTER — Telehealth: Payer: Self-pay | Admitting: Medical Oncology

## 2013-02-25 DIAGNOSIS — D0590 Unspecified type of carcinoma in situ of unspecified breast: Secondary | ICD-10-CM

## 2013-02-25 DIAGNOSIS — C50911 Malignant neoplasm of unspecified site of right female breast: Secondary | ICD-10-CM

## 2013-02-25 DIAGNOSIS — D0591 Unspecified type of carcinoma in situ of right breast: Secondary | ICD-10-CM

## 2013-02-25 DIAGNOSIS — Z85118 Personal history of other malignant neoplasm of bronchus and lung: Secondary | ICD-10-CM

## 2013-02-25 LAB — CBC WITH DIFFERENTIAL/PLATELET
BASO%: 0.8 % (ref 0.0–2.0)
EOS%: 6.7 % (ref 0.0–7.0)
MCH: 30.9 pg (ref 25.1–34.0)
MCHC: 33 g/dL (ref 31.5–36.0)
MCV: 93.6 fL (ref 79.5–101.0)
MONO%: 9.2 % (ref 0.0–14.0)
NEUT#: 2.9 10*3/uL (ref 1.5–6.5)
RBC: 4.39 10*6/uL (ref 3.70–5.45)
RDW: 13.8 % (ref 11.2–14.5)

## 2013-02-25 LAB — CANCER ANTIGEN 27.29: CA 27.29: 29 U/mL (ref 0–39)

## 2013-02-25 LAB — COMPREHENSIVE METABOLIC PANEL (CC13)
ALT: 13 U/L (ref 0–55)
Alkaline Phosphatase: 47 U/L (ref 40–150)
Creatinine: 0.8 mg/dL (ref 0.6–1.1)
Sodium: 143 mEq/L (ref 136–145)
Total Bilirubin: 0.37 mg/dL (ref 0.20–1.20)
Total Protein: 6.6 g/dL (ref 6.4–8.3)

## 2013-02-25 NOTE — Telephone Encounter (Signed)
Message copied by Charma Igo on Wed Feb 25, 2013  3:54 PM ------      Message from: Si Gaul      Created: Wed Feb 25, 2013  1:30 PM       First degree relatives are at higher risk. Patient with BRCA1 and 2 are at very high risk but we did not have BRCA testing on her. We can send her for genetic council if needed.      ----- Message -----         From: Charma Igo, RN         Sent: 02/25/2013  12:33 PM           To: Si Gaul, MD            Is there a genetic issue with her breast cancer ? Asking because she has daughters       ------

## 2013-02-25 NOTE — Progress Notes (Signed)
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  PRIOR THERAPY: 1) status post left trisegmentectomy with node dissection on 06/24/2004 2) status post 4 cycles of adjuvant chemotherapy with carboplatin and paclitaxel last dose was given 10/24/2004. 3) status post right partial mastectomy on 04/10/2012 under the care of Dr. [name]. 4) status post adjuvant radiotherapy to the right breast under the care of Dr. Dayton Scrape  CURRENT THERAPY: Tamoxifen 20 mg by mouth daily started in May of 2014.   INTERVAL HISTORY: [name] 62 y.o. female returns to the clinic today for followup visit. The patient is feeling fine today with no specific complaints. The patient is tolerating her treatment with tamoxifen fairly well with no significant adverse effects. She had a recent mammogram in November of 2014 which showed suspicious microcalcification in the lumpectomy site warranting tissue biopsy. She underwent right breast stereotactic biopsy and it showed ductal carcinoma in situ was calcification. The tumor was positive for ER and PR. She is here today for evaluation and discussion of her treatment options. She denied having any uterine bleeding. She has no chest pain, shortness breath, cough or hemoptysis.  MEDICAL HISTORY: Past Medical History  Diagnosis Date  . Anemia   . Arthritis   . Thyroid disease   . Cancer 2006    lung  . Ductal carcinoma in situ of right breast diagnosed in December of 2013, status post right lumpectomy in January of 2014 04/28/2012  . Breast cancer 03/06/12    dx Right  mass bx=11 o'clock=atypical ductal hyperplasia   . Breast  cancer 04/10/12    right lumpectomy=Multifocal interm.grade ductal cqrcinom in situ w/necrosis  ER/PR=+  . Allergy   . Lung cancer, diagnosed in March of 2006 stage II a status post left trisegmentectomy with lymph node dissection followed by 4 cycles of adjuvant chemotherapy     left upper /surgery and chemo  . Radiation 07/03/12-07/09/12    Right upper lung 5400 cGy    ALLERGIES:  is allergic to penicillins; adhesive; amoxicillin; asa; banana; ceftin; lactose intolerance (gi); milk-related compounds; and orange fruit.  MEDICATIONS:  Current Outpatient Prescriptions  Medication Sig Dispense Refill  . Ascorbic Acid (VITAMIN C PO) Take 500 mg by mouth as needed.      Marland Kitchen aspirin 81 MG tablet Take 81 mg by mouth daily.      . cycloSPORINE (RESTASIS) 0.05 % ophthalmic emulsion 1 drop 2 (two) times daily.      Marland Kitchen ECHINACEA PO Take by mouth as needed.      . flurbiprofen (ANSAID) 100 MG tablet Take 1 tablet by mouth.      . fluticasone (FLONASE) 50 MCG/ACT nasal spray       . guaifenesin (HUMIBID E) 400 MG TABS Take 400 mg by mouth as needed.      . Multiple Vitamin (MULTIVITAMIN) tablet Take 1 tablet by mouth daily.      Marland Kitchen PATANOL 0.1 % ophthalmic solution Place into both eyes as needed.       Bertram Gala Glycol-Propyl Glycol (SYSTANE OP) Apply 1 drop to eye as needed.       . Probiotic  Product (PROBIOTIC & ACIDOPHILUS EX ST PO) Take 1 capsule by mouth daily.      . pseudoephedrine (SUDAFED) 30 MG tablet Take 30 mg by mouth as needed for congestion.      . tamoxifen (NOLVADEX) 20 MG tablet Take 1 tablet (20 mg total) by mouth daily.  90 tablet  12  . Zinc 50 MG TABS Take by mouth as needed.       No current facility-administered medications for this visit.    SURGICAL HISTORY:  Past Surgical History  Procedure Laterality Date  . Lung removal, partial    . Wisdom tooth extraction    . Finger surgery    . Nasal sinus surgery    . Video assisted thoracoscopy (vats)/ lobectomy  2006    left  upper lobe  . Trigger finger release  2004,2001    multiple-lt and rt hands  . Nasal sinus surgery      multiple sinus surg  . Appendectomy  1971  . Partial mastectomy with needle localization  04/10/2012    Procedure: PARTIAL MASTECTOMY WITH NEEDLE LOCALIZATION;  Surgeon: Maisie Fus A. Cornett, MD;  Location: [facility];  Service: General;  Laterality: Right;  . Breast surgery    . Mouth surgery Right may 2014    cyst    REVIEW OF SYSTEMS:  Constitutional: negative Eyes: negative Ears, nose, mouth, throat, and face: negative Respiratory: negative Cardiovascular: negative Gastrointestinal: negative Genitourinary:negative Integument/breast: negative Hematologic/lymphatic: negative Musculoskeletal:negative Neurological: negative Behavioral/Psych: negative Endocrine: negative Allergic/Immunologic: negative   PHYSICAL EXAMINATION: General appearance: alert, cooperative and no distress Head: Normocephalic, without obvious abnormality, atraumatic Neck: no adenopathy Lymph nodes: Cervical, supraclavicular, and axillary nodes normal. Resp: clear to auscultation bilaterally Cardio: regular rate and rhythm, S1, S2 normal, no murmur, click, rub or gallop GI: soft, non-tender; bowel sounds normal; no masses,  no organomegaly Extremities: extremities normal, atraumatic, no cyanosis or edema Neurologic: Alert and oriented X 3, normal strength and tone. Normal symmetric reflexes. Normal coordination and gait  ECOG PERFORMANCE STATUS: 0 - Asymptomatic  Blood pressure 122/79, pulse 71, temperature 97 F (36.1 C), temperature source Oral, resp. rate 18, height 5\' 4"  (1.626 m), weight 152 lb (68.947 kg).  LABORATORY DATA: Lab Results  Component Value Date   WBC 5.1 02/25/2013   HGB 13.6 02/25/2013   HCT 41.1 02/25/2013   MCV 93.6 02/25/2013   PLT 298 02/25/2013      Chemistry      Component Value Date/Time   NA 143 04/28/2012 1008   NA 140 04/08/2012 0900   K 4.3 04/28/2012  1008   K 4.1 04/08/2012 0900   CL 108* 04/28/2012 1008   CL 103 04/08/2012 0900   CO2 25 04/28/2012 1008   CO2 26 04/08/2012 0900   BUN 13.0 04/28/2012 1008   BUN 18 04/08/2012 0900   CREATININE 0.8 04/28/2012 1008   CREATININE 0.74 04/08/2012 0900      Component Value Date/Time   CALCIUM 9.1 04/28/2012 1008   CALCIUM 9.4 04/08/2012 0900   ALKPHOS 80 04/28/2012 1008   ALKPHOS 83 04/08/2012 0900   AST 18 04/28/2012 1008   AST 21 04/08/2012 0900   ALT 17 04/28/2012 1008   ALT 16 04/08/2012 0900   BILITOT 0.38 04/28/2012 1008   BILITOT 0.3 04/08/2012 0900       RADIOGRAPHIC STUDIES: Dg Chest 1 View  02/22/2013   CLINICAL DATA:  Restaging breast cancer. Prior history of lung cancer.  EXAM: CHEST - 1 VIEW  COMPARISON:  Chest CT 06/01/2009 and chest radiograph 07/17/2005  FINDINGS: Heart mediastinal contours are normal. Sub-cm nodular density at the right lung apex appears unchanged from chest radiograph of 2007 and chest CT 2011. Postoperative changes noted on the left, with chronic tenting of the left hemidiaphragm. No airspace disease, mass lesion, or pleural effusion is identified. No acute bony abnormality.  IMPRESSION: Stable chest radiograph.  No acute findings identified.   Electronically Signed   By: Britta Mccreedy M.D.   On: 02/22/2013 13:51   Mm Digital Diagnostic Bilat  02/11/2013   CLINICAL DATA:  History of right lumpectomy in January 2014. Radiation treatment February to March of 2014.  EXAM: DIGITAL DIAGNOSTIC  BILATERAL MAMMOGRAM WITH CAD  DIGITAL BREAST TOMOSYNTHESIS  Digital breast tomosynthesis images are acquired in two projections. These images are reviewed in combination with the digital mammogram, confirming the findings below.  COMPARISON:  04/10/2012 and earlier  ACR Breast Density Category c: The breasts are heterogeneously dense, which may obscure small masses.  FINDINGS: There are postoperative changes in the upper-outer quadrant of the right breast. Within the lumpectomy site, there are  numerous microcalcifications which are further evaluated with magnified views. On these views, many of the calcifications demonstrate layering. However these appear similar to a large area calcifications that the patient had at the time of lumpectomy. PATHOLOGY at that time demonstrated multifocal ductal carcinoma in situ with necrosis and multiple calcifications. As such, tissue diagnosis is recommended to exclude malignancy.  Within the left breast, no suspicious abnormalities are identified.  Mammographic images were processed with CAD.  IMPRESSION: 1. Expected postoperative changes in the right breast. 2. Suspicious microcalcifications in the lumpectomy site warranting tissue diagnosis.  RECOMMENDATION: Stereotactic guided core biopsy is recommended and has been scheduled for the patient on 01/2013 at 9 o'clock a.m.  I have discussed the findings and recommendations with the patient. Results were also provided in writing at the conclusion of the visit. If applicable, a reminder letter will be sent to the patient regarding the next appointment.  BI-RADS CATEGORY  4: Suspicious abnormality - biopsy should be considered.   Electronically Signed   By: Rosalie Gums M.D.   On: 02/11/2013 09:00   Mm Rt Breast Bx W Loc Dev 1st Lesion Image Bx Spec Stereo Guide  02/16/2013   ADDENDUM REPORT: 02/16/2013 14:08  ADDENDUM: Final pathology results of a right breast stereotactic biopsy show ductal carcinoma in situ with calcifications. Pathology results are concordant with imaging findings. The patient has previously been treated for right breast DCIS with lumpectomy and radiation in early 2014.  Pathology results were discussed with the patient by telephone today 02/16/2013. Our office scheduled her a follow-up appointment with Dr. [name] on 02/27/2013. The patient states that she will likely reschedule the appointment after she has met with doctor Lajoya Dombek on 02/25/2013.  She reports no problems following the biopsy.    Electronically Signed   By: Britta Mccreedy M.D.   On: 02/16/2013 14:08   02/16/2013   CLINICAL DATA:  Personal history of DCIS status post right lumpectomy in January of 2014 with subsequent radiation therapy February through April of 2014. The patient presents for stereotactic biopsy of calcifications in the lumpectomy site following recent diagnostic evaluation on 02/11/2013.  EXAM: STEREOTACTIC VACUUM ASSIST RIGHT  COMPARISON:  Previous exams.  FINDINGS: The patient and I discussed the procedure of stereotactic-guided biopsy including benefits and alternatives. We discussed the high likelihood of a successful procedure. We discussed the risks  of the procedure including infection, bleeding, tissue injury, clip migration, and inadequate sampling. Informed written consent was given. The usual time out protocol was peformed immediately prior to the procedure.  Using sterile technique and 2% Lidocaine as local anesthetic, under stereotactic guidance, a 10 gauge mammotome device was used to perform core needle biopsy of calcifications in the upper-outer portion of the right breast using a lateral medial approach. Specimen radiograph was performed showing calcifications in the majority of the acquired specimens. Specimens with calcifications are identified for pathology.  At the conclusion of the procedure a dumbbell shaped tissue marker clip was deployed into the biopsy cavity. Follow-up 2-view mammogram confirmed clip position to be located within the visualized biopsy cavity.  IMPRESSION: Stereotactic-guided biopsy of right breast calcifications as described above. No apparent complications.  Electronically Signed: By: Leda Gauze M.D. On: 02/12/2013 13:10   ASSESSMENT AND PLAN: This is a very pleasant 62 years old white female with history of stage II a non-small cell lung cancer as well as recurrent right breast ductal carcinoma in situ. She status post right lumpectomy followed by adjuvant radiotherapy and  currently on treatment with tamoxifen. I have a lengthy discussion with the patient today about her current condition and treatment options. I strongly recommended for the patient to consider right mastectomy as a curative option for her recurrent ductal carcinoma in situ. I may consider the patient for hormonal treatment with an aromatase inhibitor after her surgery for prophylaxis against breast cancer on the left breast which will reduce her risk of contralateral breast cancer by 50%. The patient is also interested in constructive surgery after her right mastectomy and she will discuss this with Dr. [name] next week. I would see her back for followup visit after her surgery for a more detailed discussion of the hormonal therapy. She was advised to call immediately if she has any concerning symptoms in the interval. All questions were answered. The patient knows to call the clinic with any problems, questions or concerns. We can certainly see the patient much sooner if necessary. Time spent with the patient today was 25 minutes with more than 50% in counseling regarding her recurrent breast cancer.

## 2013-02-25 NOTE — Telephone Encounter (Signed)
Message left on pt mobile phone.

## 2013-03-03 ENCOUNTER — Ambulatory Visit (INDEPENDENT_AMBULATORY_CARE_PROVIDER_SITE_OTHER): Payer: 59 | Admitting: Surgery

## 2013-03-03 ENCOUNTER — Encounter (INDEPENDENT_AMBULATORY_CARE_PROVIDER_SITE_OTHER): Payer: Self-pay | Admitting: Surgery

## 2013-03-03 VITALS — BP 110/76 | HR 64 | Temp 96.9°F | Resp 14 | Ht 64.0 in | Wt 153.0 lb

## 2013-03-03 DIAGNOSIS — D0511 Intraductal carcinoma in situ of right breast: Secondary | ICD-10-CM

## 2013-03-03 DIAGNOSIS — D0590 Unspecified type of carcinoma in situ of unspecified breast: Secondary | ICD-10-CM

## 2013-03-03 NOTE — Progress Notes (Signed)
Patient ID: [name], female   DOB: Apr 04, 1950, 62 y.o.   MRN: [id number]  Chief Complaint  Patient presents with  . Breast Cancer    HPI [name] is a 62 y.o. female.  Patient returns in followup for recurrent right breast DCIS diagnosed on mammography and by core biopsy in November 2014. One year ago, she was diagnosed with right breast DCIS and underwent breast conservation surgery, radiation therapy and was placed on tamoxifen. Suspicious microcalcifications were noted on her mammogram this year and core biopsy showed DCIS in her lumpectomy bed. HPI  Past Medical History  Diagnosis Date  . Anemia   . Arthritis   . Thyroid disease   . Cancer 2006    lung  . Ductal carcinoma in situ of right breast diagnosed in December of 2013, status post right lumpectomy in January of 2014 04/28/2012  . Breast cancer 03/06/12    dx Right  mass bx=11 o'clock=atypical ductal hyperplasia   . Breast cancer 04/10/12    right lumpectomy=Multifocal interm.grade ductal cqrcinom in situ w/necrosis  ER/PR=+  . Allergy   . Lung cancer, diagnosed in March of 2006 stage II a status post left trisegmentectomy with lymph node dissection followed by 4 cycles of adjuvant chemotherapy     left upper /surgery and chemo  . Radiation 07/03/12-07/09/12    Right upper lung 5400 cGy    Past Surgical History  Procedure Laterality Date  . Lung removal, partial    . Wisdom tooth extraction    . Finger surgery    . Nasal sinus surgery    . Video assisted thoracoscopy (vats)/ lobectomy  2006    left upper lobe  . Trigger finger release  2004,2001    multiple-lt and rt hands  . Nasal sinus surgery      multiple sinus surg  . Appendectomy  1971  . Partial mastectomy with needle localization  04/10/2012    Procedure: PARTIAL MASTECTOMY WITH NEEDLE LOCALIZATION;  Surgeon: Maisie Fus A. Sharon Stapel, MD;  Location: [facility];  Service: General;  Laterality: Right;  . Breast surgery    . Mouth surgery  Right may 2014    cyst    Family History  Problem Relation Age of Onset  . Breast cancer Sister   . Cancer Sister   . Breast cancer Maternal Aunt   . Cancer Maternal Aunt   . Stomach cancer    . Leukemia Father   . Cancer Father     Social History History  Substance Use Topics  . Smoking status: Former Smoker -- 1.00 packs/day for 35 years    Types: Cigarettes    Quit date: 03/14/2005  . Smokeless tobacco: Not on file  . Alcohol Use: Yes     Comment: 1 red wine glass every night    Allergies  Allergen Reactions  . Penicillins Hives  . Adhesive [Tape] Other (See Comments)    redness  . Amoxicillin Hives  . Asa [Aspirin]   . Banana   . Ceftin [Cefuroxime Axetil] Diarrhea  . Lactose Intolerance (Gi)   . Milk-Related Compounds     Runny nose  . Orange Fruit [Citrus]     Current Outpatient Prescriptions  Medication Sig Dispense Refill  . Ascorbic Acid (VITAMIN C PO) Take 500 mg by mouth as needed.      Marland Kitchen aspirin 81 MG tablet Take 81 mg by mouth daily.      . cycloSPORINE (RESTASIS) 0.05 % ophthalmic emulsion 1  drop 2 (two) times daily.      Marland Kitchen ECHINACEA PO Take by mouth as needed.      . flurbiprofen (ANSAID) 100 MG tablet Take 1 tablet by mouth.      . fluticasone (FLONASE) 50 MCG/ACT nasal spray       . guaifenesin (HUMIBID E) 400 MG TABS Take 400 mg by mouth as needed.      . Multiple Vitamin (MULTIVITAMIN) tablet Take 1 tablet by mouth daily.      Marland Kitchen PATANOL 0.1 % ophthalmic solution Place into both eyes as needed.       Bertram Gala Glycol-Propyl Glycol (SYSTANE OP) Apply 1 drop to eye as needed.       . Probiotic Product (PROBIOTIC & ACIDOPHILUS EX ST PO) Take 1 capsule by mouth daily.      . pseudoephedrine (SUDAFED) 30 MG tablet Take 30 mg by mouth as needed for congestion.      . tamoxifen (NOLVADEX) 20 MG tablet Take 1 tablet (20 mg total) by mouth daily.  90 tablet  12  . Zinc 50 MG TABS Take by mouth as needed.       No current facility-administered  medications for this visit.    Review of Systems Review of Systems  HENT: Positive for sinus pressure.   Respiratory: Negative.   Cardiovascular: Negative.   Gastrointestinal: Negative.   Endocrine: Negative.   Genitourinary: Negative.   Allergic/Immunologic: Negative.   Neurological: Negative.     Blood pressure 110/76, pulse 64, temperature 96.9 F (36.1 C), temperature source Temporal, resp. rate 14, height 5\' 4"  (1.626 m), weight 153 lb (69.4 kg).  Physical Exam Physical Exam  Constitutional: She is oriented to person, place, and time. She appears well-developed and well-nourished.  HENT:  Head: Normocephalic and atraumatic.  Eyes: Pupils are equal, round, and reactive to light. No scleral icterus.  Neck: Normal range of motion.  Cardiovascular: Normal rate and regular rhythm.   Pulmonary/Chest: Right breast exhibits no inverted nipple, no mass, no nipple discharge, no skin change and no tenderness. Left breast exhibits no inverted nipple, no mass, no nipple discharge, no skin change and no tenderness. Breasts are symmetrical.    Musculoskeletal: Normal range of motion.  Lymphadenopathy:    She has no cervical adenopathy.    She has no axillary adenopathy.  Neurological: She is alert and oriented to person, place, and time.  Skin: Skin is warm and dry.  Psychiatric: She has a normal mood and affect. Her behavior is normal. Judgment and thought content normal.    Data Reviewed History of right lumpectomy in January 2014.  Radiation treatment February to March of 2014.  EXAM:  DIGITAL DIAGNOSTIC BILATERAL MAMMOGRAM WITH CAD  DIGITAL BREAST TOMOSYNTHESIS  Digital breast tomosynthesis images are acquired in two projections.  These images are reviewed in combination with the digital mammogram,  confirming the findings below.  COMPARISON: 04/10/2012 and earlier  ACR Breast Density Category c: The breasts are heterogeneously  dense, which may obscure small masses.    FINDINGS:  There are postoperative changes in the upper-outer quadrant of the  right breast. Within the lumpectomy site, there are numerous  microcalcifications which are further evaluated with magnified  views. On these views, many of the calcifications demonstrate  layering. However these appear similar to a large area  calcifications that the patient had at the time of lumpectomy.  PATHOLOGY at that time demonstrated multifocal ductal carcinoma in  situ with necrosis and multiple calcifications.  As such, tissue  diagnosis is recommended to exclude malignancy.  Within the left breast, no suspicious abnormalities are identified.  Mammographic images were processed with CAD.  IMPRESSION:  1. Expected postoperative changes in the right breast.  2. Suspicious microcalcifications in the lumpectomy site warranting  tissue diagnosis.  RECOMMENDATION:  Stereotactic guided core biopsy is recommended and has been  scheduled for the patient on 01/2013 at 9 o'clock a.m.  I have discussed the findings and recommendations with the patient.  Results were also provided in writing at the conclusion of the  visit. If applicable, a reminder letter will be sent to the patient  regarding the next appointment.   Assessment    Recurrent right breast DCIS    Plan    Right breast mastectomy with sentinel lymph node mapping. Patient is interested reconstruction. Refer to plastic surgery. Once patient decides about reconstruction, we'll schedule for right simple mastectomy with sentinel lymph node mapping.The surgical and non surgical options have been discussed with the patient.  Risks of surgery include bleeding,  Infection,  Flap necrosis,  Tissue loss,  Chronic pain, death, Numbness,  And the need for additional procedures.  Reconstruction options also have been discussed with the patient as well.  The patient agrees to proceed.Sentinel lymph node mapping and dissection has been discussed with the  patient.  Risk of bleeding,  Infection,  Seroma formation,  Additional procedures,,  Shoulder weakness ,  Shoulder stiffness,  Nerve and blood vessel injury and reaction to the mapping dyes have been discussed.  Alternatives to surgery have been discussed with the patient.  The patient agrees to proceed.       Javoris Star A. 03/03/2013, 12:09 PM

## 2013-03-03 NOTE — Patient Instructions (Signed)
Total or Modified Radical Mastectomy  Care After Refer to this sheet in the next few weeks. These instructions provide you with information on caring for yourself after your procedure. Your caregiver may also give you more specific instructions. Your treatment has been planned according to current medical practices, but problems sometimes occur. Call your caregiver if you have any problems or questions after your procedure. ACTIVITY  Your caregiver will advise you when you may resume strenuous activities, driving, and sports.  After the drain(s) are removed, you may do light housework. Avoid heavy lifting, carrying, or pushing. You should not be lifting anything heavier than 5 lbs.  Take frequent rest periods. You may tire more easily than usual.  Always rest and elevate the arm affected by your surgery for a period of time equal to your activity time.  Continue doing the exercises given to you by the physical therapist/occupational therapist even after full range of motion has returned. The amount of time this takes will vary from person to person.  After normal range of motion has returned, some stiffness and soreness may persist for 2-3 months. This is normal and will subside.  Begin sports or strenuous activities in moderation. This will give you a chance to rebuild your endurance. Continue to be cautious of heavy lifting or carrying (no more than 10 lbs.) with your affected arm.  You may return to work as recommended by your caregiver. NUTRITION  You may resume your normal diet.  Make sure you drink plenty of fluids (6-8 glasses a day).  Eat a well-balanced diet. Including daily portions of food from government recommended food groups:  Grains.  Vegetables.  Fruits.  Milk.  Meat & beans.  Oils. Visit http://mypyramid.gov/ for more information HYGIENE  You may wash your hair.  If your incision (cut from surgery) is closed, you may shower or tub bathe, unless instructed  otherwise by your doctor. FEVER  If you feel feverish or have shaking chills, take your temperature. If your temperature is 102 F (38.9 C) or above, call your caregiver. The fever may mean there is an infection.  If you call early, infection can be treated with antibiotics and hospitalization may be avoided. PAIN CONTROL  Mild discomfort may occur.  You may need to take an over-the-counter pain medication or a medication prescribed by your caregiver.  Call your caregiver if you experience increased pain. INCISION CARE  Check your incision daily for increased redness, drainage, swelling, or separation of skin.  Call your caregiver if any of the above are noted. ARM AND HAND CARE  If the lymph nodes under your arm were removed with a modified radical mastectomy, there may be a greater tendency for the arm to swell.  Try to avoid having blood pressures taken, blood drawn, or injections given in the affected arm. This is the arm on the same side as the surgery.  Use hand lotion to soften cuticles instead of cutting them to avoid cutting yourself.  Be careful when shaving your under arms. Use an electric shaver if possible. You may use a deodorant after the incision has completely healed. Until then, clean under your arms with hydrogen peroxide.  Use reasonable precaution when cooking, sewing, and gardening to avoid burning or needle or thorn pricks.  Do not weigh your arm straight down with a package or your purse.  Follow the exercises and instructions given to you by the physical therapist/occupational therapist and your caregiver. FOLLOW-UP APPOINTMENT Call your caregiver for a   follow-up appointment as directed. PROSTHESIS INFORMATION Wear your temporary prosthesis (artificial breast) until your caregiver gives you permission to purchase a permanent one. This will depend upon your rate of healing. We suggest you also wait until you are physically and emotionally ready to shop for  one. The suitability depends on several individual factors. We do not endorse any particular prosthesis, but suggest you try several until you are satisfied with appearance and fit. A list of stores may be obtained from your local [employer] at www.cancer.org or 1-800-ACS-2345 (1-800-227-2345). A permanent prosthesis is medically necessary to restore balance. It is also income tax deductible. Be sure all receipts are marked "surgical". It is not essential to purchase a bra. You may sew a pocket into your regular bra. Note: Remember to take all of your medical insurance information with you when shopping for your prosthesis. SELECTING A PROSTHESIS FITTER You may want to ask the following questions when selecting a fitter:  What styles and brands of forms are carried in stock?  How long have the forms been on the market and have there been any problems with them?  Why would one form be better than another?  How long should a particular form last?  May I wear the form for a trial period without obligation?  Do the forms require a prosthetic bra? If so, what is the price range? Must I always wear that style?  If alterations to the bra are necessary, can they be done at this location or be sent out?  Will I be charged for alterations?  Will I receive suggestions on how to alter my own wardrobe, if necessary?  Will you special order forms or bras if necessary?  Are fitters always available to meet my needs?  What kinds of garments should be worn for the fitting?  Are lounge wear, swim wear, and accessories available?  If I have insurance coverage or Medicare, will you suggest ways for processing the paper work?  Do you keep complete records so that mail reordering is possible?  How are warranty claims handled if I have a problem with the form? Document Released: 11/03/2003 Document Revised: 06/04/2011 Document Reviewed: 07/08/2007 Exit[employer] Patient Information 2014  [employer]  

## 2013-03-04 ENCOUNTER — Telehealth (INDEPENDENT_AMBULATORY_CARE_PROVIDER_SITE_OTHER): Payer: Self-pay | Admitting: *Deleted

## 2013-03-04 NOTE — Telephone Encounter (Signed)
I spoke with pt and informed her of the appt with Dr. Kelly Splinter on 12/24 with an arrival time of 11:15am.  I informed her of their location.  She is agreeable to this appt.

## 2013-03-26 HISTORY — PX: REDUCTION MAMMAPLASTY: SUR839

## 2013-04-01 ENCOUNTER — Telehealth (INDEPENDENT_AMBULATORY_CARE_PROVIDER_SITE_OTHER): Payer: Self-pay | Admitting: Surgery

## 2013-04-01 ENCOUNTER — Telehealth (INDEPENDENT_AMBULATORY_CARE_PROVIDER_SITE_OTHER): Payer: Self-pay

## 2013-04-01 ENCOUNTER — Other Ambulatory Visit (INDEPENDENT_AMBULATORY_CARE_PROVIDER_SITE_OTHER): Payer: Self-pay | Admitting: Surgery

## 2013-04-01 DIAGNOSIS — D0511 Intraductal carcinoma in situ of right breast: Secondary | ICD-10-CM

## 2013-04-01 NOTE — Telephone Encounter (Signed)
I returned patients call. She was very upset about having to come in for follow up before scheduling surgery. She states she was not told she would have to come back in . She has been to several doctors appointment, and had countless test done and says she is at her breaking point and cannot take anymore. I let her know I spoke to Dr Brantley Stage and he has agreed to do her orders tomorrow and have our schedulers call her then. I also let her know the hospital has set rules that patient has to be seen in a certain amount of time before surgery. So if we call her and have to see her prior to surgery that is why. Patient apologized for becoming upset and angry but she is extremely stressed to the max. I told her I know she is going through a lot and we would help get her through it. Patient thankful for the help.

## 2013-04-01 NOTE — Telephone Encounter (Signed)
Patient called wants surgery, per Dr. Josetta Huddle note she has to come in for an office visit before surgery can be scheduled.  Have left patient voice message telling her to call back to make an office visit appointment.

## 2013-05-11 ENCOUNTER — Encounter (HOSPITAL_COMMUNITY): Payer: Self-pay | Admitting: Pharmacy Technician

## 2013-05-12 ENCOUNTER — Other Ambulatory Visit (HOSPITAL_COMMUNITY): Payer: 59

## 2013-05-18 ENCOUNTER — Encounter (HOSPITAL_COMMUNITY): Payer: Self-pay

## 2013-05-18 ENCOUNTER — Encounter (HOSPITAL_COMMUNITY)
Admission: RE | Admit: 2013-05-18 | Discharge: 2013-05-18 | Disposition: A | Discharge status: Home / Self Care | Payer: 59 | Source: Ambulatory Visit | Attending: Surgery | Admitting: Surgery

## 2013-05-18 VITALS — BP 122/64 | HR 84 | Temp 97.6°F | Resp 20 | Ht 64.0 in | Wt 153.4 lb

## 2013-05-18 DIAGNOSIS — D0511 Intraductal carcinoma in situ of right breast: Secondary | ICD-10-CM

## 2013-05-18 DIAGNOSIS — D649 Anemia, unspecified: Secondary | ICD-10-CM | POA: Diagnosis present

## 2013-05-18 DIAGNOSIS — D0590 Unspecified type of carcinoma in situ of unspecified breast: Secondary | ICD-10-CM | POA: Diagnosis present

## 2013-05-18 DIAGNOSIS — Z85118 Personal history of other malignant neoplasm of bronchus and lung: Secondary | ICD-10-CM | POA: Diagnosis not present

## 2013-05-18 DIAGNOSIS — Z87891 Personal history of nicotine dependence: Secondary | ICD-10-CM | POA: Diagnosis not present

## 2013-05-18 HISTORY — DX: Anxiety disorder, unspecified: F41.9

## 2013-05-18 HISTORY — DX: Other specified postprocedural states: R11.2

## 2013-05-18 HISTORY — DX: Other specified postprocedural states: Z98.890

## 2013-05-18 LAB — CBC WITH DIFFERENTIAL/PLATELET
Basophils Absolute: 0 10*3/uL (ref 0.0–0.1)
Basophils Relative: 0 % (ref 0–1)
EOS ABS: 0.5 10*3/uL (ref 0.0–0.7)
Eosinophils Relative: 7 % — ABNORMAL HIGH (ref 0–5)
HCT: 42.2 % (ref 36.0–46.0)
Hemoglobin: 14.3 g/dL (ref 12.0–15.0)
LYMPHS ABS: 2 10*3/uL (ref 0.7–4.0)
Lymphocytes Relative: 25 % (ref 12–46)
MCH: 30.9 pg (ref 26.0–34.0)
MCHC: 33.9 g/dL (ref 30.0–36.0)
MCV: 91.1 fL (ref 78.0–100.0)
Monocytes Absolute: 0.4 10*3/uL (ref 0.1–1.0)
Monocytes Relative: 5 % (ref 3–12)
NEUTROS ABS: 4.9 10*3/uL (ref 1.7–7.7)
NEUTROS PCT: 63 % (ref 43–77)
PLATELETS: 331 10*3/uL (ref 150–400)
RBC: 4.63 MIL/uL (ref 3.87–5.11)
RDW: 13.1 % (ref 11.5–15.5)
WBC: 7.8 10*3/uL (ref 4.0–10.5)

## 2013-05-18 LAB — BASIC METABOLIC PANEL
BUN: 11 mg/dL (ref 6–23)
CO2: 27 mEq/L (ref 19–32)
Calcium: 10.7 mg/dL — ABNORMAL HIGH (ref 8.4–10.5)
Chloride: 103 mEq/L (ref 96–112)
Creatinine, Ser: 0.79 mg/dL (ref 0.50–1.10)
GFR, EST NON AFRICAN AMERICAN: 88 mL/min — AB (ref >90)
GLUCOSE: 96 mg/dL (ref 70–99)
POTASSIUM: 4.2 meq/L (ref 3.7–5.3)
SODIUM: 143 meq/L (ref 137–147)

## 2013-05-18 MED ORDER — CHLORHEXIDINE GLUCONATE 4 % EX LIQD: 1.0000 "application " | Freq: Once | CUTANEOUS | Status: DC

## 2013-05-18 NOTE — Progress Notes (Signed)
Will have Bryson Ha review drain in throat.And any f/u

## 2013-05-18 NOTE — Progress Notes (Signed)
Anesthesia PAT Evaluation:  Patient is a 63 year old female scheduled for right mastectomy and SN mapping (Dr. Brantley Stage) followed by right breast reconstruction with Latissamus Muscle Flap and placement of tissue expander (Dr. Migdalia Dk) on 05/20/13.   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 Raj Janus [phone]).  She has a drain that extends to her right maxillary sinus to her right upper gum line.  She has to flush it with salt water BID and gets films about every three months to monitor for tumor shrinkage.  Reportedly it is shrinking, and Dr. Hardie Shackleton hopes to excise the tumor sometime this year if it shrinks enough.  Patient reports that Dr. Hardie Shackleton said that the drain should not interfere with her airway/anesthesia.  It does not affect her mouth opening. Primarily only a small metal wire is noted at her right back upper gum line that is fairly flushed with her gum line.  PCP is Dr. Azalia Bilis with [facility].   She did not meet anesthesia's criteria for pre-operative EKG.  She denied chest pain and prior cardiac testing such as stress and echo. Exam showed heart RRR, no murmurs noted.  No LE edema.  Lungs sounds clear, slightly diminished on right lung base.  No wheezes or rhonchi.  She had a "head cold" late last week.  She has been improving and overall feels well.  She denied fever or significant respiratory symptoms. She does not appear or sound acutely ill.  O2 sat 99%. WBC WNL.  1V CXR on 02/22/13 showed: Heart mediastinal contours are normal. Sub-cm nodular density at the right lung apex appears unchanged from chest  radiograph of 2007 and chest CT 2011. Postoperative changes noted on the left, with chronic tenting of the left hemidiaphragm. No airspace disease, mass lesion, or pleural effusion is identified. No acute bony abnormality.  Preoperative labs noted.    I updated anesthesiologist Dr. Tamala Julian regarding odontogenic tumor history.  It does not appear that her drain will interfere with airway management.  If no acute changes then I anticipate that she can proceed as planned.  George Hugh [facility] Short Stay Center/Anesthesiology Phone [phone] 05/18/2013 5:50 PM

## 2013-05-18 NOTE — Pre-Procedure Instructions (Signed)
[name]  05/18/2013   Your procedure is scheduled on:  05/20/12  Report to [place]  2 * 3 at 630 AM.  Call this number if you have problems the morning of surgery: [phone]   Remember:   Do not eat food or drink liquids after midnight.   Take these medicines the morning of surgery with A SIP OF WATER: flonase,eye drops   Do not wear jewelry, make-up or nail polish.  Do not wear lotions, powders, or perfumes. You may wear deodorant.  Do not shave 48 hours prior to surgery. Men may shave face and neck.  Do not bring valuables to the hospital.  [facility] is not responsible                  for any belongings or valuables.               Contacts, dentures or bridgework may not be worn into surgery.  Leave suitcase in the car. After surgery it may be brought to your room.  For patients admitted to the hospital, discharge time is determined by your                treatment team.               Patients discharged the day of surgery will not be allowed to drive  home.  Name and phone number of your driver: family  Special Instructions: Shower using CHG 2 nights before surgery and the night before surgery.  If you shower the day of surgery use CHG.  Use special wash - you have one bottle of CHG for all showers.  You should use approximately 1/3 of the bottle for each shower.   Please read over the following fact sheets that you were given: Pain Booklet, Coughing and Deep Breathing and Surgical Site Infection Prevention

## 2013-05-19 MED ORDER — VANCOMYCIN HCL IN DEXTROSE 1-5 GM/200ML-% IV SOLN
1000.0000 mg | INTRAVENOUS | Status: AC
  Administered 2013-05-20: 1000 mg via INTRAVENOUS
  Filled 2013-05-19: qty 200

## 2013-05-20 ENCOUNTER — Encounter (HOSPITAL_COMMUNITY): Payer: 59 | Admitting: Vascular Surgery

## 2013-05-20 ENCOUNTER — Ambulatory Visit (HOSPITAL_COMMUNITY): Payer: 59 | Admitting: Anesthesiology

## 2013-05-20 ENCOUNTER — Encounter (HOSPITAL_COMMUNITY): Payer: Self-pay

## 2013-05-20 ENCOUNTER — Other Ambulatory Visit: Payer: Self-pay | Admitting: Plastic Surgery

## 2013-05-20 ENCOUNTER — Inpatient Hospital Stay (HOSPITAL_COMMUNITY)
Admission: RE | Admit: 2013-05-20 | Discharge: 2013-05-23 | DRG: 583 | Disposition: A | Discharge status: Home / Self Care | Payer: 59 | Source: Ambulatory Visit | Attending: Plastic Surgery | Admitting: Plastic Surgery

## 2013-05-20 ENCOUNTER — Ambulatory Visit (HOSPITAL_COMMUNITY)
Admission: RE | Admit: 2013-05-20 | Discharge: 2013-05-20 | Disposition: A | Discharge status: Home / Self Care | Payer: 59 | Source: Ambulatory Visit | Attending: Surgery | Admitting: Surgery

## 2013-05-20 ENCOUNTER — Encounter (HOSPITAL_COMMUNITY)
Admission: RE | Disposition: A | Discharge status: Home / Self Care | Payer: Self-pay | Source: Ambulatory Visit | Attending: Plastic Surgery

## 2013-05-20 ENCOUNTER — Encounter (HOSPITAL_COMMUNITY): Payer: Self-pay | Admitting: *Deleted

## 2013-05-20 DIAGNOSIS — D0590 Unspecified type of carcinoma in situ of unspecified breast: Secondary | ICD-10-CM | POA: Diagnosis not present

## 2013-05-20 DIAGNOSIS — Z87891 Personal history of nicotine dependence: Secondary | ICD-10-CM

## 2013-05-20 DIAGNOSIS — D649 Anemia, unspecified: Secondary | ICD-10-CM | POA: Diagnosis present

## 2013-05-20 DIAGNOSIS — Z85118 Personal history of other malignant neoplasm of bronchus and lung: Secondary | ICD-10-CM

## 2013-05-20 DIAGNOSIS — C50911 Malignant neoplasm of unspecified site of right female breast: Secondary | ICD-10-CM

## 2013-05-20 DIAGNOSIS — C50919 Malignant neoplasm of unspecified site of unspecified female breast: Secondary | ICD-10-CM | POA: Diagnosis present

## 2013-05-20 DIAGNOSIS — D0511 Intraductal carcinoma in situ of right breast: Secondary | ICD-10-CM

## 2013-05-20 HISTORY — PX: BREAST RECONSTRUCTION WITH PLACEMENT OF TISSUE EXPANDER AND FLEX HD (ACELLULAR HYDRATED DERMIS): SHX6295

## 2013-05-20 HISTORY — DX: Reserved for concepts with insufficient information to code with codable children: IMO0002

## 2013-05-20 HISTORY — DX: Unspecified osteoarthritis, unspecified site: M19.90

## 2013-05-20 HISTORY — PX: MASTECTOMY W/ SENTINEL NODE BIOPSY: SHX2001

## 2013-05-20 HISTORY — DX: Dizziness and giddiness: R42

## 2013-05-20 LAB — BASIC METABOLIC PANEL
BUN: 12 mg/dL (ref 6–23)
CO2: 25 mEq/L (ref 19–32)
Calcium: 8.6 mg/dL (ref 8.4–10.5)
Chloride: 102 mEq/L (ref 96–112)
Creatinine, Ser: 0.69 mg/dL (ref 0.50–1.10)
GFR calc Af Amer: >90 mL/min (ref >90)
GLUCOSE: 202 mg/dL — AB (ref 70–99)
Potassium: 5.1 mEq/L (ref 3.7–5.3)
Sodium: 139 mEq/L (ref 137–147)

## 2013-05-20 LAB — CBC
HEMATOCRIT: 36.5 % (ref 36.0–46.0)
Hemoglobin: 12.2 g/dL (ref 12.0–15.0)
MCH: 30.7 pg (ref 26.0–34.0)
MCHC: 33.4 g/dL (ref 30.0–36.0)
MCV: 91.7 fL (ref 78.0–100.0)
PLATELETS: 295 10*3/uL (ref 150–400)
RBC: 3.98 MIL/uL (ref 3.87–5.11)
RDW: 13.2 % (ref 11.5–15.5)
WBC: 10.2 10*3/uL (ref 4.0–10.5)

## 2013-05-20 SURGERY — MASTECTOMY WITH SENTINEL LYMPH NODE BIOPSY
Anesthesia: General | Site: Breast | Laterality: Right

## 2013-05-20 MED ORDER — PROPOFOL 10 MG/ML IV BOLUS
INTRAVENOUS | Status: AC
  Filled 2013-05-20: qty 20

## 2013-05-20 MED ORDER — SCOPOLAMINE 1 MG/3DAYS TD PT72
MEDICATED_PATCH | TRANSDERMAL | Status: DC | PRN
  Administered 2013-05-20: 1 via TRANSDERMAL

## 2013-05-20 MED ORDER — OXYCODONE HCL 5 MG PO TABS: 5.0000 mg | ORAL_TABLET | Freq: Once | ORAL | Status: DC | PRN

## 2013-05-20 MED ORDER — SODIUM CHLORIDE 0.9 % IJ SOLN
INTRAMUSCULAR | Status: DC | PRN
  Administered 2013-05-20: 09:00:00 via SUBCUTANEOUS

## 2013-05-20 MED ORDER — ALBUMIN HUMAN 5 % IV SOLN
INTRAVENOUS | Status: DC | PRN
  Administered 2013-05-20: 10:00:00 via INTRAVENOUS

## 2013-05-20 MED ORDER — LACTATED RINGERS IV SOLN
INTRAVENOUS | Status: DC | PRN
  Administered 2013-05-20 (×3): via INTRAVENOUS

## 2013-05-20 MED ORDER — SODIUM CHLORIDE 0.9 % IJ SOLN: 9.0000 mL | INTRAMUSCULAR | Status: DC | PRN

## 2013-05-20 MED ORDER — MIDAZOLAM HCL 2 MG/2ML IJ SOLN
INTRAMUSCULAR | Status: AC
  Filled 2013-05-20: qty 2

## 2013-05-20 MED ORDER — HYDROMORPHONE HCL PF 1 MG/ML IJ SOLN
0.2500 mg | INTRAMUSCULAR | Status: DC | PRN
  Administered 2013-05-20 (×2): 0.5 mg via INTRAVENOUS

## 2013-05-20 MED ORDER — LIDOCAINE-EPINEPHRINE (PF) 1 %-1:200000 IJ SOLN
INTRAMUSCULAR | Status: DC | PRN
  Administered 2013-05-20: 30 mL

## 2013-05-20 MED ORDER — KCL IN DEXTROSE-NACL 20-5-0.45 MEQ/L-%-% IV SOLN
INTRAVENOUS | Status: AC
  Filled 2013-05-20: qty 1000

## 2013-05-20 MED ORDER — EVICEL 5 ML EX KIT
PACK | CUTANEOUS | Status: AC
  Filled 2013-05-20: qty 1

## 2013-05-20 MED ORDER — ROCURONIUM BROMIDE 100 MG/10ML IV SOLN
INTRAVENOUS | Status: DC | PRN
  Administered 2013-05-20: 20 mg via INTRAVENOUS
  Administered 2013-05-20: 50 mg via INTRAVENOUS
  Administered 2013-05-20 (×2): 10 mg via INTRAVENOUS

## 2013-05-20 MED ORDER — CIPROFLOXACIN IN D5W 400 MG/200ML IV SOLN
400.0000 mg | Freq: Two times a day (BID) | INTRAVENOUS | Status: DC
  Administered 2013-05-21 – 2013-05-22 (×4): 400 mg via INTRAVENOUS
  Filled 2013-05-20 (×4): qty 200

## 2013-05-20 MED ORDER — HYDROMORPHONE HCL PF 1 MG/ML IJ SOLN
INTRAMUSCULAR | Status: AC
  Filled 2013-05-20: qty 1

## 2013-05-20 MED ORDER — BISACODYL 5 MG PO TBEC: 5.0000 mg | DELAYED_RELEASE_TABLET | Freq: Every day | ORAL | Status: DC | PRN

## 2013-05-20 MED ORDER — LIDOCAINE HCL (CARDIAC) 20 MG/ML IV SOLN
INTRAVENOUS | Status: DC | PRN
  Administered 2013-05-20: 70 mg via INTRAVENOUS

## 2013-05-20 MED ORDER — KCL IN DEXTROSE-NACL 20-5-0.45 MEQ/L-%-% IV SOLN
INTRAVENOUS | Status: DC
  Administered 2013-05-20: 23:00:00 via INTRAVENOUS
  Administered 2013-05-20: 125 mL/h via INTRAVENOUS
  Filled 2013-05-20 (×3): qty 1000

## 2013-05-20 MED ORDER — PROPOFOL 10 MG/ML IV BOLUS
INTRAVENOUS | Status: DC | PRN
  Administered 2013-05-20: 200 mg via INTRAVENOUS

## 2013-05-20 MED ORDER — METHYLENE BLUE 1 % INJ SOLN
INTRAMUSCULAR | Status: AC
  Filled 2013-05-20: qty 10

## 2013-05-20 MED ORDER — FENTANYL CITRATE 0.05 MG/ML IJ SOLN
INTRAMUSCULAR | Status: DC | PRN
  Administered 2013-05-20 (×2): 50 ug via INTRAVENOUS
  Administered 2013-05-20 (×2): 100 ug via INTRAVENOUS
  Administered 2013-05-20: 150 ug via INTRAVENOUS

## 2013-05-20 MED ORDER — MORPHINE SULFATE (PF) 1 MG/ML IV SOLN
INTRAVENOUS | Status: DC
  Administered 2013-05-20: 4.5 mg via INTRAVENOUS
  Administered 2013-05-20: 9 mg via INTRAVENOUS
  Administered 2013-05-20 – 2013-05-21 (×2): 1.5 mg via INTRAVENOUS
  Administered 2013-05-21: 3 mg via INTRAVENOUS

## 2013-05-20 MED ORDER — SODIUM CHLORIDE 0.9 % IV SOLN
INTRAVENOUS | Status: DC | PRN
  Administered 2013-05-20: 210 mL via INTRAMUSCULAR

## 2013-05-20 MED ORDER — NALOXONE HCL 0.4 MG/ML IJ SOLN: 0.4000 mg | INTRAMUSCULAR | Status: DC | PRN

## 2013-05-20 MED ORDER — DIAZEPAM 2 MG PO TABS
2.0000 mg | ORAL_TABLET | Freq: Two times a day (BID) | ORAL | Status: DC | PRN
  Administered 2013-05-21 – 2013-05-22 (×2): 2 mg via ORAL
  Filled 2013-05-20 (×2): qty 1

## 2013-05-20 MED ORDER — METOCLOPRAMIDE HCL 5 MG/ML IJ SOLN
INTRAMUSCULAR | Status: AC
  Filled 2013-05-20: qty 2

## 2013-05-20 MED ORDER — DIPHENHYDRAMINE HCL 12.5 MG/5ML PO ELIX: 12.5000 mg | ORAL_SOLUTION | Freq: Four times a day (QID) | ORAL | Status: DC | PRN

## 2013-05-20 MED ORDER — BUPIVACAINE HCL (PF) 0.25 % IJ SOLN
INTRAMUSCULAR | Status: AC
  Filled 2013-05-20: qty 30

## 2013-05-20 MED ORDER — ONDANSETRON HCL 4 MG/2ML IJ SOLN
INTRAMUSCULAR | Status: DC | PRN
  Administered 2013-05-20: 4 mg via INTRAVENOUS

## 2013-05-20 MED ORDER — EVICEL 5 ML EX KIT
PACK | CUTANEOUS | Status: DC | PRN
  Administered 2013-05-20 (×2): 5 mL

## 2013-05-20 MED ORDER — MORPHINE SULFATE (PF) 1 MG/ML IV SOLN
INTRAVENOUS | Status: AC
  Filled 2013-05-20: qty 25

## 2013-05-20 MED ORDER — DOCUSATE SODIUM 100 MG PO CAPS
100.0000 mg | ORAL_CAPSULE | Freq: Three times a day (TID) | ORAL | Status: DC
  Administered 2013-05-20 – 2013-05-23 (×9): 100 mg via ORAL
  Filled 2013-05-20 (×9): qty 1

## 2013-05-20 MED ORDER — NEOSTIGMINE METHYLSULFATE 1 MG/ML IJ SOLN
INTRAMUSCULAR | Status: DC | PRN
  Administered 2013-05-20: 4 mg via INTRAVENOUS

## 2013-05-20 MED ORDER — SODIUM CHLORIDE 0.9 % IJ SOLN
INTRAMUSCULAR | Status: AC
  Filled 2013-05-20: qty 10

## 2013-05-20 MED ORDER — TECHNETIUM TC 99M SULFUR COLLOID FILTERED
1.0000 | Freq: Once | INTRAVENOUS | Status: AC | PRN
  Administered 2013-05-20: 1 via INTRADERMAL

## 2013-05-20 MED ORDER — SODIUM CHLORIDE 0.9 % IR SOLN
Status: DC | PRN
  Administered 2013-05-20: 13:00:00

## 2013-05-20 MED ORDER — MORPHINE SULFATE 10 MG/ML IJ SOLN
INTRAMUSCULAR | Status: DC | PRN
  Administered 2013-05-20 (×2): 5 mg via INTRAVENOUS

## 2013-05-20 MED ORDER — GLYCOPYRROLATE 0.2 MG/ML IJ SOLN
INTRAMUSCULAR | Status: DC | PRN
  Administered 2013-05-20: .7 mg via INTRAVENOUS

## 2013-05-20 MED ORDER — ONDANSETRON HCL 4 MG/2ML IJ SOLN: 4.0000 mg | Freq: Four times a day (QID) | INTRAMUSCULAR | Status: DC | PRN

## 2013-05-20 MED ORDER — MORPHINE SULFATE 10 MG/ML IJ SOLN
INTRAMUSCULAR | Status: AC
  Filled 2013-05-20: qty 1

## 2013-05-20 MED ORDER — METOCLOPRAMIDE HCL 5 MG/ML IJ SOLN
10.0000 mg | Freq: Once | INTRAMUSCULAR | Status: AC | PRN
  Administered 2013-05-20: 10 mg via INTRAVENOUS

## 2013-05-20 MED ORDER — OXYCODONE HCL 5 MG/5ML PO SOLN: 5.0000 mg | Freq: Once | ORAL | Status: DC | PRN

## 2013-05-20 MED ORDER — MIDAZOLAM HCL 5 MG/5ML IJ SOLN
INTRAMUSCULAR | Status: DC | PRN
  Administered 2013-05-20: 2 mg via INTRAVENOUS

## 2013-05-20 MED ORDER — FLEET ENEMA 7-19 GM/118ML RE ENEM
1.0000 | ENEMA | Freq: Once | RECTAL | Status: AC | PRN
  Filled 2013-05-20: qty 1

## 2013-05-20 MED ORDER — DIPHENHYDRAMINE HCL 50 MG/ML IJ SOLN: 12.5000 mg | Freq: Four times a day (QID) | INTRAMUSCULAR | Status: DC | PRN

## 2013-05-20 MED ORDER — FENTANYL CITRATE 0.05 MG/ML IJ SOLN
INTRAMUSCULAR | Status: AC
  Filled 2013-05-20: qty 5

## 2013-05-20 MED ORDER — CYCLOSPORINE 0.05 % OP EMUL
1.0000 [drp] | Freq: Two times a day (BID) | OPHTHALMIC | Status: DC
  Administered 2013-05-20 – 2013-05-23 (×6): 1 [drp] via OPHTHALMIC
  Filled 2013-05-20 (×8): qty 1

## 2013-05-20 MED ORDER — SCOPOLAMINE 1 MG/3DAYS TD PT72
MEDICATED_PATCH | TRANSDERMAL | Status: AC
  Filled 2013-05-20: qty 1

## 2013-05-20 MED ORDER — DEXAMETHASONE SODIUM PHOSPHATE 4 MG/ML IJ SOLN
INTRAMUSCULAR | Status: DC | PRN
  Administered 2013-05-20: 8 mg via INTRAVENOUS

## 2013-05-20 MED ORDER — FENTANYL CITRATE 0.05 MG/ML IJ SOLN
INTRAMUSCULAR | Status: AC
Start: 2013-05-20 — End: 2013-05-20
  Filled 2013-05-20: qty 5

## 2013-05-20 MED ORDER — OXYCODONE HCL ER 10 MG PO T12A
10.0000 mg | EXTENDED_RELEASE_TABLET | Freq: Two times a day (BID) | ORAL | Status: DC
  Administered 2013-05-20 – 2013-05-23 (×6): 10 mg via ORAL
  Filled 2013-05-20 (×6): qty 1

## 2013-05-20 MED ORDER — FLUTICASONE PROPIONATE 50 MCG/ACT NA SUSP
2.0000 | Freq: Every day | NASAL | Status: DC
  Administered 2013-05-21 – 2013-05-23 (×3): 2 via NASAL
  Filled 2013-05-20: qty 16

## 2013-05-20 MED ORDER — SENNOSIDES-DOCUSATE SODIUM 8.6-50 MG PO TABS
1.0000 | ORAL_TABLET | Freq: Every evening | ORAL | Status: DC | PRN
  Administered 2013-05-23: 1 via ORAL
  Filled 2013-05-20: qty 1

## 2013-05-20 MED ORDER — DEXAMETHASONE SODIUM PHOSPHATE 4 MG/ML IJ SOLN
INTRAMUSCULAR | Status: AC
  Filled 2013-05-20: qty 2

## 2013-05-20 MED ORDER — OLOPATADINE HCL 0.1 % OP SOLN
1.0000 [drp] | OPHTHALMIC | Status: DC | PRN
  Filled 2013-05-20: qty 5

## 2013-05-20 SURGICAL SUPPLY — 82 items
ADH SKN CLS APL DERMABOND .7 (GAUZE/BANDAGES/DRESSINGS) ×2
ADH SKN CLS LQ APL DERMABOND (GAUZE/BANDAGES/DRESSINGS) ×2
APPLIER CLIP 9.375 MED OPEN (MISCELLANEOUS) ×8
APR CLP MED 9.3 20 MLT OPN (MISCELLANEOUS) ×4
ATCH SMKEVC FLXB CAUT HNDSWH (FILTER) IMPLANT
ATTRACTOMAT 16X20 MAGNETIC DRP (DRAPES) ×2 IMPLANT
BAG DECANTER FOR FLEXI CONT (MISCELLANEOUS) ×4 IMPLANT
BINDER BREAST LRG (GAUZE/BANDAGES/DRESSINGS) ×2 IMPLANT
BINDER BREAST XLRG (GAUZE/BANDAGES/DRESSINGS) IMPLANT
BIOPATCH RED 1 DISK 7.0 (GAUZE/BANDAGES/DRESSINGS) ×3 IMPLANT
BIOPATCH RED 1IN DISK 7.0MM (GAUZE/BANDAGES/DRESSINGS) ×1
BIOPATCH WHT 1IN DISK W/4.0 H (GAUZE/BANDAGES/DRESSINGS) ×2 IMPLANT
CANISTER SUCTION 2500CC (MISCELLANEOUS) ×8 IMPLANT
CHLORAPREP W/TINT 26ML (MISCELLANEOUS) ×8 IMPLANT
CLIP APPLIE 9.375 MED OPEN (MISCELLANEOUS) ×2 IMPLANT
CLOTH BEACON ORANGE TIMEOUT ST (SAFETY) ×4 IMPLANT
CONT SPEC 4OZ CLIKSEAL STRL BL (MISCELLANEOUS) ×6 IMPLANT
COVER PROBE W GEL 5X96 (DRAPES) ×6 IMPLANT
COVER SURGICAL LIGHT HANDLE (MISCELLANEOUS) ×8 IMPLANT
DERMABOND ADHESIVE PROPEN (GAUZE/BANDAGES/DRESSINGS) ×2
DERMABOND ADVANCED (GAUZE/BANDAGES/DRESSINGS) ×2
DERMABOND ADVANCED .7 DNX12 (GAUZE/BANDAGES/DRESSINGS) ×4 IMPLANT
DERMABOND ADVANCED .7 DNX6 (GAUZE/BANDAGES/DRESSINGS) IMPLANT
DRAIN CHANNEL 19F RND (DRAIN) ×10 IMPLANT
DRAPE INCISE IOBAN 66X45 STRL (DRAPES) ×2 IMPLANT
DRAPE LAPAROSCOPIC ABDOMINAL (DRAPES) ×4 IMPLANT
DRAPE ORTHO SPLIT 77X108 STRL (DRAPES) ×16
DRAPE PROXIMA HALF (DRAPES) ×4 IMPLANT
DRAPE SURG 17X23 STRL (DRAPES) IMPLANT
DRAPE SURG ORHT 6 SPLT 77X108 (DRAPES) ×8 IMPLANT
DRAPE UTILITY 15X26 W/TAPE STR (DRAPE) ×8 IMPLANT
DRAPE WARM FLUID 44X44 (DRAPE) ×4 IMPLANT
DRSG MEPILEX BORDER 4X12 (GAUZE/BANDAGES/DRESSINGS) IMPLANT
DRSG MEPILEX BORDER 4X8 (GAUZE/BANDAGES/DRESSINGS) ×2 IMPLANT
DRSG PAD ABDOMINAL 8X10 ST (GAUZE/BANDAGES/DRESSINGS) ×6 IMPLANT
ELECT BLADE 4.0 EZ CLEAN MEGAD (MISCELLANEOUS) ×4
ELECT CAUTERY BLADE 6.4 (BLADE) ×4 IMPLANT
ELECT REM PT RETURN 9FT ADLT (ELECTROSURGICAL) ×8
ELECTRODE BLDE 4.0 EZ CLN MEGD (MISCELLANEOUS) ×2 IMPLANT
ELECTRODE REM PT RTRN 9FT ADLT (ELECTROSURGICAL) ×4 IMPLANT
EVACUATOR SILICONE 100CC (DRAIN) ×10 IMPLANT
EVACUATOR SMOKE ACCUVAC VALLEY (FILTER) ×2
GLOVE BIO SURGEON STRL SZ 6.5 (GLOVE) ×3 IMPLANT
GLOVE BIO SURGEON STRL SZ8 (GLOVE) ×4 IMPLANT
GLOVE BIO SURGEONS STRL SZ 6.5 (GLOVE) ×1
GLOVE BIOGEL PI IND STRL 8 (GLOVE) ×2 IMPLANT
GLOVE BIOGEL PI INDICATOR 8 (GLOVE) ×2
GOWN STRL NON-REIN LRG LVL3 (GOWN DISPOSABLE) ×20 IMPLANT
GOWN STRL REIN XL XLG (GOWN DISPOSABLE) ×4 IMPLANT
IMPL BREAST TIS EXP M 350CC (Breast) IMPLANT
IMPLANT BREAST TIS EXP M 350CC (Breast) ×4 IMPLANT
KIT BASIN OR (CUSTOM PROCEDURE TRAY) ×8 IMPLANT
KIT ROOM TURNOVER OR (KITS) ×6 IMPLANT
NDL 18GX1X1/2 (RX/OR ONLY) (NEEDLE) ×2 IMPLANT
NDL HYPO 25GX1X1/2 BEV (NEEDLE) ×2 IMPLANT
NEEDLE 18GX1X1/2 (RX/OR ONLY) (NEEDLE) ×4 IMPLANT
NEEDLE HYPO 25GX1X1/2 BEV (NEEDLE) ×4 IMPLANT
NS IRRIG 1000ML POUR BTL (IV SOLUTION) ×16 IMPLANT
PACK GENERAL/GYN (CUSTOM PROCEDURE TRAY) ×8 IMPLANT
PAD ARMBOARD 7.5X6 YLW CONV (MISCELLANEOUS) ×8 IMPLANT
PIN SAFETY STERILE (MISCELLANEOUS) ×4 IMPLANT
SET ASEPTIC TRANSFER (MISCELLANEOUS) ×2 IMPLANT
SPECIMEN JAR X LARGE (MISCELLANEOUS) ×4 IMPLANT
SPONGE GAUZE 4X4 12PLY (GAUZE/BANDAGES/DRESSINGS) ×4 IMPLANT
STAPLER VISISTAT 35W (STAPLE) IMPLANT
SUT ETHILON 3 0 FSL (SUTURE) ×2 IMPLANT
SUT MNCRL AB 4-0 PS2 18 (SUTURE) ×8 IMPLANT
SUT MON AB 3-0 SH 27 (SUTURE) ×20
SUT MON AB 3-0 SH27 (SUTURE) IMPLANT
SUT MON AB 5-0 PS2 18 (SUTURE) ×14 IMPLANT
SUT PDS AB 2-0 CT1 27 (SUTURE) ×4 IMPLANT
SUT PDS AB 3-0 SH 27 (SUTURE) ×6 IMPLANT
SUT SILK 3 0 SH 30 (SUTURE) ×8 IMPLANT
SUT VIC AB 3-0 SH 18 (SUTURE) ×4 IMPLANT
SUT VIC AB 3-0 SH 27 (SUTURE) ×4
SUT VIC AB 3-0 SH 27X BRD (SUTURE) ×6 IMPLANT
SUT VIC AB 3-0 SH 8-18 (SUTURE) ×2 IMPLANT
SUT VICRYL 4-0 PS2 18IN ABS (SUTURE) ×4 IMPLANT
SYR CONTROL 10ML LL (SYRINGE) ×4 IMPLANT
TOWEL OR 17X24 6PK STRL BLUE (TOWEL DISPOSABLE) ×8 IMPLANT
TOWEL OR 17X26 10 PK STRL BLUE (TOWEL DISPOSABLE) ×8 IMPLANT
TRAY FOLEY CATH 14FRSI W/METER (CATHETERS) ×2 IMPLANT

## 2013-05-20 NOTE — Anesthesia Procedure Notes (Signed)
Procedure Name: Intubation Date/Time: 05/20/2013 8:51 AM Performed by: Eligha Bridegroom Pre-anesthesia Checklist: Patient identified, Timeout performed, Emergency Drugs available, Suction available and Patient being monitored Patient Re-evaluated:Patient Re-evaluated prior to inductionOxygen Delivery Method: Circle system utilized Preoxygenation: Pre-oxygenation with 100% oxygen Intubation Type: IV induction Ventilation: Mask ventilation without difficulty Laryngoscope Size: Mac and 3 Grade View: Grade II Tube size: 7.0 mm Number of attempts: 1 Airway Equipment and Method: Stylet Secured at: 21 cm Tube secured with: Tape Dental Injury: Teeth and Oropharynx as per pre-operative assessment

## 2013-05-20 NOTE — H&P (Signed)
[name] is an 63 y.o. female.   Chief Complaint: right breast cancer HPI: The patient is a 63 yrs old female here for treatment for right breast cancer with immediate right breast reconstruction with expander placement and latissimus muscle flap.  She was sent by Dr. Brantley Stage. She has recurrent right breast DCIS diagnosed on mammography and by core biopsy in November 2014. One year ago, she was diagnosed with right breast DCIS and underwent breast conservation surgery, radiation therapy and was placed on tamoxifen. Suspicious microcalcifications were noted on her mammogram this year and core biopsy showed DCIS in her lumpectomy bed. She has a history of anemia, arthritis, thyroid disease, breast cancer and right lung cancer. The patient underwent a partial lung resection, appendectomy, nasal surgery, oral surgery and partial mastectomy. The tissue from 03/2012 and 01/2013 was ER/PR positive. There is a strong family history of breast cancer. She was a smoker and quit in 2006 and has been treated for lung cancer.    Past Medical History  Diagnosis Date  . Anemia   . Arthritis   . Thyroid disease   . Cancer 2006    lung  . Ductal carcinoma in situ of right breast diagnosed in December of 2013, status post right lumpectomy in January of 2014 04/28/2012  . Breast cancer 03/06/12    dx Right  mass bx=11 o'clock=atypical ductal hyperplasia   . Breast cancer 04/10/12    right lumpectomy=Multifocal interm.grade ductal cqrcinom in situ w/necrosis  ER/PR=+  . Allergy   . Lung cancer, diagnosed in March of 2006 stage II a status post left trisegmentectomy with lymph node dissection followed by 4 cycles of adjuvant chemotherapy     left upper /surgery and chemo  . Radiation 07/03/12-07/09/12    Right upper lung 5400 cGy  . Anxiety   . Sinus abscess     keratocystic odotogenic tumor with growth into right maxillary sinus s/p marsupialization with drain 10/2012 (Dr. Raj Janus) with plans to excise in  12-18 months  . PONV (postoperative nausea and vomiting)     keratocystic odontogenic tumor s/p marsupialization 10/2012 with right upper mouth drain (flush with right upper gum); anticipated removal after 12-18 months    Past Surgical History  Procedure Laterality Date  . Lung removal, partial    . Wisdom tooth extraction    . Finger surgery    . Nasal sinus surgery    . Video assisted thoracoscopy (vats)/ lobectomy  2006    left upper lobe  . Trigger finger release  2004,2001    multiple-lt and rt hands  . Nasal sinus surgery      multiple sinus surg  . Appendectomy  1971  . Partial mastectomy with needle localization  04/10/2012    Procedure: PARTIAL MASTECTOMY WITH NEEDLE LOCALIZATION;  Surgeon: Marcello Moores A. Cornett, MD;  Location: [facility];  Service: General;  Laterality: Right;  . Breast surgery    . Mouth surgery Right may 2014    cyst    Family History  Problem Relation Age of Onset  . Breast cancer Sister   . Cancer Sister   . Breast cancer Maternal Aunt   . Cancer Maternal Aunt   . Stomach cancer    . Leukemia Father   . Cancer Father    Social History:  reports that she quit smoking about 8 years ago. Her smoking use included Cigarettes. She has a 35 pack-year smoking history. She has never used smokeless tobacco. She reports  that she drinks alcohol. She reports that she does not use illicit drugs.  Allergies:  Allergies  Allergen Reactions  . Penicillins Hives  . Adhesive [Tape] Other (See Comments)    redness  . Amoxicillin Hives  . Asa [Aspirin]   . Banana   . Ceftin [Cefuroxime Axetil] Diarrhea  . Lactose Intolerance (Gi)   . Milk-Related Compounds     Runny nose  . Orange Fruit [Citrus]      (Not in a hospital admission)  Results for orders placed during the hospital encounter of 05/18/13 (from the past 48 hour(s))  CBC WITH DIFFERENTIAL     Status: Abnormal   Collection Time    05/18/13  2:19 PM      Result Value Ref Range    WBC 7.8  4.0 - 10.5 K/uL   RBC 4.63  3.87 - 5.11 MIL/uL   Hemoglobin 14.3  12.0 - 15.0 g/dL   HCT 42.2  36.0 - 46.0 %   MCV 91.1  78.0 - 100.0 fL   MCH 30.9  26.0 - 34.0 pg   MCHC 33.9  30.0 - 36.0 g/dL   RDW 13.1  11.5 - 15.5 %   Platelets 331  150 - 400 K/uL   Neutrophils Relative % 63  43 - 77 %   Neutro Abs 4.9  1.7 - 7.7 K/uL   Lymphocytes Relative 25  12 - 46 %   Lymphs Abs 2.0  0.7 - 4.0 K/uL   Monocytes Relative 5  3 - 12 %   Monocytes Absolute 0.4  0.1 - 1.0 K/uL   Eosinophils Relative 7 (*) 0 - 5 %   Eosinophils Absolute 0.5  0.0 - 0.7 K/uL   Basophils Relative 0  0 - 1 %   Basophils Absolute 0.0  0.0 - 0.1 K/uL  BASIC METABOLIC PANEL     Status: Abnormal   Collection Time    05/18/13  2:19 PM      Result Value Ref Range   Sodium 143  137 - 147 mEq/L   Potassium 4.2  3.7 - 5.3 mEq/L   Chloride 103  96 - 112 mEq/L   CO2 27  19 - 32 mEq/L   Glucose, Bld 96  70 - 99 mg/dL   BUN 11  6 - 23 mg/dL   Creatinine, Ser 0.79  0.50 - 1.10 mg/dL   Calcium 10.7 (*) 8.4 - 10.5 mg/dL   GFR calc non Af Amer 88 (*) >90 mL/min   GFR calc Af Amer >90  >90 mL/min   Comment: (NOTE)     The eGFR has been calculated using the CKD EPI equation.     This calculation has not been validated in all clinical situations.     eGFR's persistently <90 mL/min signify possible Chronic Kidney     Disease.   No results found.  Review of Systems  Constitutional: Negative.   HENT: Negative.   Eyes: Negative.   Respiratory: Negative.   Cardiovascular: Negative.   Gastrointestinal: Negative.   Genitourinary: Negative.   Musculoskeletal: Negative.   Skin: Negative.   Neurological: Negative.   Endo/Heme/Allergies: Negative.   Psychiatric/Behavioral: Negative.     There were no vitals taken for this visit. Physical Exam  Constitutional: She appears well-developed and well-nourished.  HENT:  Head: Normocephalic and atraumatic.  Eyes: Conjunctivae and EOM are normal. Pupils are equal, round,  and reactive to light.  Neck: Normal range of motion.  Cardiovascular: Normal rate.  Respiratory: Effort normal.  GI: Soft.  Musculoskeletal: Normal range of motion.  Neurological: She is alert.  Skin: Skin is warm.  Psychiatric: She has a normal mood and affect. Her behavior is normal. Judgment and thought content normal.     Assessment/Plan Right breast immediate reconstruction with expander placement and latissimus muscle flap.   SANGER,CLAIRE 05/20/2013, 7:30 AM

## 2013-05-20 NOTE — Transfer of Care (Signed)
Immediate Anesthesia Transfer of Care Note  Patient: [name]  Procedure(s) Performed: Procedure(s): RIGHT SIMPLEMASTECTOMY AND SENTINEL NODE   Sampling (Right) IMMEDIATE RIGHT BREAST RECONSTRUCTION WITH LATISSAMUS MUSCLE FLAP AND PLACEMENT OF TISSUE EXPANDER TO RIGHT BREAST (Right)  Patient Location: PACU  Anesthesia Type:General  Level of Consciousness: awake and alert   Airway & Oxygen Therapy: Patient Spontanous Breathing and Patient connected to nasal cannula oxygen  Post-op Assessment: Report given to PACU RN and Post -op Vital signs reviewed and stable  Post vital signs: Reviewed and stable  Complications: No apparent anesthesia complications

## 2013-05-20 NOTE — Anesthesia Postprocedure Evaluation (Signed)
  Anesthesia Post-op Note  Patient: [name]  Procedure(s) Performed: Procedure(s): RIGHT SIMPLEMASTECTOMY AND SENTINEL NODE   Sampling (Right) IMMEDIATE RIGHT BREAST RECONSTRUCTION WITH LATISSAMUS MUSCLE FLAP AND PLACEMENT OF TISSUE EXPANDER TO RIGHT BREAST (Right)  Patient Location: PACU  Anesthesia Type:General  Level of Consciousness: awake and alert   Airway and Oxygen Therapy: Patient Spontanous Breathing  Post-op Pain: mild  Post-op Assessment: Post-op Vital signs reviewed, Patient's Cardiovascular Status Stable and Respiratory Function Stable  Post-op Vital Signs: Reviewed  Filed Vitals:   05/20/13 1415  BP:   Pulse: 82  Temp:   Resp: 15    Complications: No apparent anesthesia complications

## 2013-05-20 NOTE — Anesthesia Preprocedure Evaluation (Signed)
Anesthesia Evaluation  Patient identified by MRN, date of birth, ID band Patient awake    Reviewed: Allergy & Precautions, H&P , NPO status , Patient's Chart, lab work & pertinent test results, reviewed documented beta blocker date and time   History of Anesthesia Complications (+) PONV and history of anesthetic complications  Airway Mallampati: II TM Distance: >3 FB Neck ROM: full    Dental   Pulmonary former smoker,  breath sounds clear to auscultation        Cardiovascular negative cardio ROS  Rhythm:regular     Neuro/Psych negative neurological ROS  negative psych ROS   GI/Hepatic negative GI ROS, Neg liver ROS,   Endo/Other  negative endocrine ROS  Renal/GU negative Renal ROS  negative genitourinary   Musculoskeletal   Abdominal   Peds  Hematology negative hematology ROS (+)   Anesthesia Other Findings See surgeon's H&P   Reproductive/Obstetrics negative OB ROS                           Anesthesia Physical Anesthesia Plan  ASA: III  Anesthesia Plan: General   Post-op Pain Management:    Induction: Intravenous  Airway Management Planned: Oral ETT  Additional Equipment:   Intra-op Plan:   Post-operative Plan: Extubation in OR  Informed Consent: I have reviewed the patients History and Physical, chart, labs and discussed the procedure including the risks, benefits and alternatives for the proposed anesthesia with the patient or authorized representative who has indicated his/her understanding and acceptance.   Dental Advisory Given  Plan Discussed with: CRNA and Surgeon  Anesthesia Plan Comments:         Anesthesia Quick Evaluation

## 2013-05-20 NOTE — Preoperative (Signed)
Beta Blockers   Reason not to administer Beta Blockers:Not Applicable 

## 2013-05-20 NOTE — Op Note (Signed)
Preoperative diagnosis: Recurrent right breast DCIS  Postoperative diagnosis: Same  Procedure: Right simple mastectomy with sentinel lymph node mapping using methylene blue dye  Surgeon: Erroll Luna M.D.  Anesthesia: Gen. Endotracheal anesthesia  EBL: 50 cc  Specimens: Right breast and 2 right axillary sentinel lymph nodes to pathology  IV fluids: 500 cc crystalloid  Indications for procedure: The patient presents for recurrent right breast DCIS. She underwent breast conservation and radiation therapy for the same condition back in 2013. She developed calcifications in the lumpectomy bed core biopsy proven to be DCIS. She presents today for right simple mastectomy with reconstruction by Dr. Migdalia Dk.The surgical and non surgical options have been discussed with the patient.  Risks of surgery include bleeding,  Infection,  Flap necrosis,  Tissue loss,  Chronic pain, death, Numbness,  And the need for additional procedures.  Reconstruction options also have been discussed with the patient as well.  The patient agrees to proceed.Sentinel lymph node mapping and dissection has been discussed with the patient.  Risk of bleeding,  Infection,  Seroma formation,  Additional procedures,,  Shoulder weakness ,  Shoulder stiffness,  Nerve and blood vessel injury and reaction to the mapping dyes have been discussed.  Alternatives to surgery have been discussed with the patient.  The patient agrees to proceed.  Description of procedure: Patient was met in the holding area and right breast was marked as the correct side. Questions are answered. She underwent injection by nuclear medicine for mapping. She's and taken back to the operating room and placed upon the OR table. Upper chest and right arm were prepped and draped in a sterile fashion timeout was done. 4 cc epinephrine blue saline mixture were injected in the subareolar position and massage. Superior and inferior skin flaps are then created scalpel making  sure to include the previous lumpectomy incision. Superior and inferior skin flaps were created using electrocautery up to the clavicle superiorly and to the inferior mammary fold inferiorly. The breast was then removed off the chest wall in a medial to lateral fashion. There is significant fibrosis along the stitch line from previous radiation. The previous lumpectomy is taken out in its entirety to include the fascia. This was labeled passed off the field. A neoprobe was used and 2 hot minimally blue level I sentinel lymph nodes were identified and removed. Background counts in the axilla approached 0. Irrigation was used and was hemostatic. At this point of the case, Dr. Migdalia Dk scrubed and begin the reconstruction part of the procedure. Patient was hemodynamically stable. All counts were correct.

## 2013-05-20 NOTE — Interval H&P Note (Signed)
History and Physical Interval Note:  05/20/2013 7:47 AM  [name]  has presented today for surgery, with the diagnosis of right breast dcis  The various methods of treatment have been discussed with the patient and family. After consideration of risks, benefits and other options for treatment, the patient has consented to  Procedure(s): RIGHT SIMPLEMASTECTOMY AND SENTINEL NODE MAPPING FOLLOWED BY RECONSTRUCTION (Right) IMMEDIATE RIGHT BREAST RECONSTRUCTION WITH LATISSAMUS MUSCLE FLAP AND PLACEMENT OF TISSUE EXPANDER TO RIGHT BREAST (Right) as a surgical intervention .  The patient's history has been reviewed, patient examined, no change in status, stable for surgery.  I have reviewed the patient's chart and labs.  Questions were answered to the patient's satisfaction.     Nicola Quesnell A.

## 2013-05-20 NOTE — Brief Op Note (Signed)
05/20/2013  9:19 AM  PATIENT:  [name]  63 y.o. female  PRE-OPERATIVE DIAGNOSIS:   right breast dcis  POST-OPERATIVE DIAGNOSIS:  right breast dcis  PROCEDURE:  Procedure(s): RIGHT SIMPLEMASTECTOMY AND SENTINEL NODE MAPPING FOLLOWED BY RECONSTRUCTION (Right) IMMEDIATE RIGHT BREAST RECONSTRUCTION WITH LATISSAMUS MUSCLE FLAP AND PLACEMENT OF TISSUE EXPANDER TO RIGHT BREAST (Right)  SURGEON:  Surgeon(s) and Role: Panel 1:    * Thomas A. Cornett, MD - Primary  Panel 2:    * Ethyle Tiedt Sanger, DO - Primary  PHYSICIAN ASSISTANT: Shawn Rayburn, PA  ASSISTANTS: none   ANESTHESIA:   general  EBL: 250cc    BLOOD ADMINISTERED:none  DRAINS: (3) Jackson-Pratt drain(s) with closed bulb suction in the back (2) and right breast pocket (1)   LOCAL MEDICATIONS USED:  LIDOCAINE   SPECIMEN:  No Specimen  DISPOSITION OF SPECIMEN:  N/A  COUNTS:  YES  TOURNIQUET:  * No tourniquets in log *  DICTATION: .Dragon Dictation  PLAN OF CARE: Admit to inpatient   PATIENT DISPOSITION:  PACU - hemodynamically stable.   Delay start of Pharmacological VTE agent (>24hrs) due to surgical blood loss or risk of bleeding: no

## 2013-05-20 NOTE — Op Note (Signed)
DATE OF OPERATION: 05/20/2013  LOCATION: [facility]  PREOPERATIVE DIAGNOSIS: Right breast Cancer   POSTOPERATIVE DIAGNOSIS: Same  PROCEDURE: 1. Latissimus myocutaneous flap to reconstruct the right breast CPT 19361 2. Tissue expander placement to right breast CPT 19357 (210/350cc medium height mentor expander)  SURGEON: Topaz Raglin Sanger  ASSISTANT: Shawn Rayburn  EBL: 250 cc  SPECIMEN: None  DRAINS: 3 total 61 blake round drains  CONDITION: Stable  COMPLICATIONS: None  INDICATION: The patient, [name], is a 63 y.o. female born on 11/12/1950, is here for immediate reconstruction after right mastectomy.    PROCEDURE DETAILS:  Patient was seen on the morning of her surgery and marked out for her flap. She was given an IV and IV antibiotics. She was then taken to the operating room and given a general anesthetic.  She had SCD's and a foley catheter placed. General surgery performed their portion of the case which included a right mastectomy.  Once they were done, the patient was rendered to the plastic surgery team.  An additional standard time out was performed with all information confirmed to be correct by those in the room. The pectoralis muscle was lifted to create a pocket for the expander.  An Charlie Pitter was placed over the site to keep it clean.  The patient was then placed into the left lateral decubitus position with all key points padded. She was then prepped and draped in the standard sterile fashion using betadine. The posterior paddle design and position was confirmed. The margins of the paddle were incised and dissection performed till all margins of the muscle were identified. The muscle was then released at all margins but the origin.  Care was taken not to pick up the serratus anteriorly or the paraspinous muscles posteriorly. The flap was then raised to the scapula and released and rotated medially. Care was taken to protect the vascular pedicle throughout this portion of  the procedure. The paddle and muscle looked healthy throughout the case.  The muscle  and the skin paddle from the back were then rotated into the chest pocket.  Another ioban dressing was applied to the front operative site. The flap pedicle was inspected and there was no tension. The back pocket was hemostased and Evicel placed. Two #19 blake round drains were place and secured with 3-0 Silk. The back incision was closed with buried 3-0 Vicryl, followed by 4-0 Monocryl and 5-0 Monocryl.  Dermabond and a protective dressing was applied.   The patient was then repositioned onto her back and the chest was prepped and draped with a betadine. The breast pocket was inspected and hemostases was achieved with electrocautery. The muscle was then secured superiorly to the pectoralis muscle with 3-0 Monocryl. A 350 cc expander was chosen. It was soaked in triple antibiotic solution and evacuated of air and then filled with 210 cc of sterile saline. The inferior portion of the muscle was tacked to the inframammary fold with 3-0 PDS.  One drain was placed and secured with 3-0 Silk. The flap was then closed with 3-0 Monocryl deep, followed by 4-0 Monocryl and the skin closed with 5-0 Monocryl.  Dermabond, ABDs and a breast binder was applied.   The patient was allowed to wake up and taken to recovery room in stable condition at the end of the case. The family was notified at the end of the case as well.

## 2013-05-20 NOTE — H&P (Signed)
Transplants    None    Demographics [name] 63 year old female  [place]:   [place]  [place] [address] [phone] (H) Works at [employer]  Problem ListHospitalization ProblemNon-Hospital  Lung cancer, diagnosed in March of 2006 stage II a status post left trisegmentectomy with lymph node dissection followed by 4 cycles of adjuvant chemotherapy  Ductal carcinoma in situ of right breast diagnosed in December of 2013, status post right lumpectomy in January of 2014  Breast cancer  Significant History/Details  Smoking: Former Smoker (Quit Date:03/14/2005), 1 ppd, 35 pack-years  Smokeless Tobacco: Never Used  Alcohol: Yes  3 open orders  Preferred Language: English  Dialysis HistoryNone   Currently admitted as of 2/25/2015Specialty CommentsEditShow AllReport12/20/13 Pt signed PHI for Mariz[facility] Borror ([birth year])bnr DOS 1.16.14 [facility]-OP Right Breast Needle Localization(900am @ [facility]) Partial Mastectomy/ MAC/12.20.13 TLC 04/07/2012 patient scheduled for op surgery 04/10/2012 @ [facility] per automation no precert required for cpt code given ref # for phone call [id number]. (chm,tlc)  04/01/13 per Dr. Loletha Grayer pt needs to come in for office visit before surgery can be sch, left pt vm, de DOS 05/20/13 TC/DR SANGER T/F-[facility]- Rt simple mastectomy w/ SLN(8am)mapping/de 04/02/13 19303, [id number], [id number] 04/10/2013 patient scheduled for 23 hrs observation surgery 26/20/3559 @ [facility] no precert required. (de,chm)    MedicationsHospital Medications Outpatient Medications  vancomycin (VANCOCIN) IVPB 1000 mg/200 mL premix    Preferred Labs   None   Transplant-Related Biopsies (11 years) ** None **  Patient Blood Type (50 years)   None                                 Recent Visits (Maximum of 10 visits)Date Type Provider Description  05/20/2013 Surgery Johney Perotti A., MD   04/01/2013 Orders Only Minyon Billiter A., MD Dcis  (Ductal Carcinoma in Situ), Right (Primary Dx)  04/01/2013 Telephone Carlene Coria, [place]   04/01/2013 Telephone Erroll Luna A., MD   03/04/2013 Telephone Boyd Kerbs, RN   03/03/2013 Office Visit Randee Huston A., MD Dcis (Ductal Carcinoma in Situ) of Breast, Right (Primary Dx...  07/21/2012 Office Visit Erroll Luna A., MD History of Breast Cancer (Primary Dx)  04/25/2012 Office Visit Turner Daniels., MD Post-Operative State (Primary Dx)  04/16/2012 Orders Only Carlene Coria, CMA Breast Cancer (Primary Dx)  04/10/2012 Surgery Julius Boniface A., MD          My Last Outpatient Progress NoteStatus Last Edited Encounter Date  Signed Tue Mar 03, 2013 12:13 PM EST 03/03/2013  Patient ID: [name], female   DOB: 1950/09/06, 63 y.o.   MRN: [id number]    Chief Complaint   Patient presents with   .  Breast Cancer      HPI [name] is a 63 y.o. female.  Patient returns in followup for recurrent right breast DCIS diagnosed on mammography and by core biopsy in November 2014. One year ago, she was diagnosed with right breast DCIS and underwent breast conservation surgery, radiation therapy and was placed on tamoxifen. Suspicious microcalcifications were noted on her mammogram this year and core biopsy showed DCIS in her lumpectomy bed. HPI    Past Medical History   Diagnosis  Date   .  Anemia     .  Arthritis     .  Thyroid disease     .  Cancer  2006       lung   .  Ductal carcinoma in situ of right breast diagnosed in December of 2013, status post right lumpectomy in January of 2014  04/28/2012   .  Breast cancer  03/06/12       dx Right  mass bx=11 o'clock=atypical ductal hyperplasia    .  Breast cancer  04/10/12       right lumpectomy=Multifocal interm.grade ductal cqrcinom in situ w/necrosis  ER/PR=+   .  Allergy     .  Lung cancer, diagnosed in March of 2006 stage II a status post left trisegmentectomy with lymph node dissection followed by 4 cycles of adjuvant  chemotherapy         left upper /surgery and chemo   .  Radiation  07/03/12-07/09/12       Right upper lung 5400 cGy       Past Surgical History   Procedure  Laterality  Date   .  Lung removal, partial       .  Wisdom tooth extraction       .  Finger surgery       .  Nasal sinus surgery       .  Video assisted thoracoscopy (vats)/ lobectomy    2006       left upper lobe   .  Trigger finger release    2004,2001       multiple-lt and rt hands   .  Nasal sinus surgery           multiple sinus surg   .  Appendectomy    1971   .  Partial mastectomy with needle localization    04/10/2012       Procedure: PARTIAL MASTECTOMY WITH NEEDLE LOCALIZATION;  Surgeon: Marcello Moores A. Paolina Karwowski, MD;  Location: [facility];  Service: General;  Laterality: Right;   .  Breast surgery       .  Mouth surgery  Right  may 2014       cyst       Family History   Problem  Relation  Age of Onset   .  Breast cancer  Sister     .  Cancer  Sister     .  Breast cancer  Maternal Aunt     .  Cancer  Maternal Aunt     .  Stomach cancer       .  Leukemia  Father     .  Cancer  Father        Social History History   Substance Use Topics   .  Smoking status:  Former Smoker -- 1.00 packs/day for 35 years       Types:  Cigarettes       Quit date:  03/14/2005   .  Smokeless tobacco:  Not on file   .  Alcohol Use:  Yes         Comment: 1 red wine glass every night       Allergies   Allergen  Reactions   .  Penicillins  Hives   .  Adhesive [Tape]  Other (See Comments)       redness   .  Amoxicillin  Hives   .  Asa [Aspirin]     .  Banana     .  Ceftin [Cefuroxime Axetil]  Diarrhea   .  Lactose Intolerance (Gi)     .  Milk-Related Compounds         Runny nose   .  Orange Fruit [Citrus]         Current Outpatient Prescriptions   Medication  Sig  Dispense  Refill   .  Ascorbic Acid (VITAMIN C PO)  Take 500 mg by mouth as needed.         Marland Kitchen  aspirin 81 MG tablet  Take 81 mg by mouth daily.          .  cycloSPORINE (RESTASIS) 0.05 % ophthalmic emulsion  1 drop 2 (two) times daily.         Marland Kitchen  ECHINACEA PO  Take by mouth as needed.         .  flurbiprofen (ANSAID) 100 MG tablet  Take 1 tablet by mouth.         .  fluticasone (FLONASE) 50 MCG/ACT nasal spray           .  guaifenesin (HUMIBID E) 400 MG TABS  Take 400 mg by mouth as needed.         .  Multiple Vitamin (MULTIVITAMIN) tablet  Take 1 tablet by mouth daily.         Marland Kitchen  PATANOL 0.1 % ophthalmic solution  Place into both eyes as needed.          Vladimir Faster Glycol-Propyl Glycol (SYSTANE OP)  Apply 1 drop to eye as needed.          .  Probiotic Product (PROBIOTIC & ACIDOPHILUS EX ST PO)  Take 1 capsule by mouth daily.         .  pseudoephedrine (SUDAFED) 30 MG tablet  Take 30 mg by mouth as needed for congestion.         .  tamoxifen (NOLVADEX) 20 MG tablet  Take 1 tablet (20 mg total) by mouth daily.   90 tablet   12   .  Zinc 50 MG TABS  Take by mouth as needed.             No current facility-administered medications for this visit.      Review of Systems Review of Systems  HENT: Positive for sinus pressure.   Respiratory: Negative.   Cardiovascular: Negative.   Gastrointestinal: Negative.   Endocrine: Negative.   Genitourinary: Negative.   Allergic/Immunologic: Negative.   Neurological: Negative.     Blood pressure 110/76, pulse 64, temperature 96.9 F (36.1 C), temperature source Temporal, resp. rate 14, height 5\' 4"  (1.626 m), weight 153 lb (69.4 kg).   Physical Exam Physical Exam  Constitutional: She is oriented to person, place, and time. She appears well-developed and well-nourished.  HENT:   Head: Normocephalic and atraumatic.  Eyes: Pupils are equal, round, and reactive to light. No scleral icterus.  Neck: Normal range of motion.  Cardiovascular: Normal rate and regular rhythm.   Pulmonary/Chest: Right breast exhibits no inverted nipple, no mass, no nipple discharge, no skin change and no tenderness.  Left breast exhibits no inverted nipple, no mass, no nipple discharge, no skin change and no tenderness. Breasts are symmetrical.    Musculoskeletal: Normal range of motion.  Lymphadenopathy:    She has no cervical adenopathy.    She has no axillary adenopathy.  Neurological: She is alert and oriented to person, place, and time.  Skin: Skin is warm and dry.  Psychiatric: She has a normal mood and affect. Her behavior is normal. Judgment and thought content normal.    Data Reviewed History of right lumpectomy in January 2014.   Radiation treatment February  to March of 2014.   EXAM:   DIGITAL DIAGNOSTIC BILATERAL MAMMOGRAM WITH CAD   DIGITAL BREAST TOMOSYNTHESIS   Digital breast tomosynthesis images are acquired in two projections.   These images are reviewed in combination with the digital mammogram,   confirming the findings below.   COMPARISON: 04/10/2012 and earlier   ACR Breast Density Category c: The breasts are heterogeneously   dense, which may obscure small masses.   FINDINGS:   There are postoperative changes in the upper-outer quadrant of the   right breast. Within the lumpectomy site, there are numerous   microcalcifications which are further evaluated with magnified   views. On these views, many of the calcifications demonstrate   layering. However these appear similar to a large area   calcifications that the patient had at the time of lumpectomy.   PATHOLOGY at that time demonstrated multifocal ductal carcinoma in   situ with necrosis and multiple calcifications. As such, tissue   diagnosis is recommended to exclude malignancy.   Within the left breast, no suspicious abnormalities are identified.   Mammographic images were processed with CAD.   IMPRESSION:   1. Expected postoperative changes in the right breast.   2. Suspicious microcalcifications in the lumpectomy site warranting   tissue diagnosis.   RECOMMENDATION:   Stereotactic guided core biopsy is  recommended and has been   scheduled for the patient on 01/2013 at 9 o'clock a.m.   I have discussed the findings and recommendations with the patient.   Results were also provided in writing at the conclusion of the   visit. If applicable, a reminder letter will be sent to the patient   regarding the next appointment.     Assessment Recurrent right breast DCIS   Plan Right breast mastectomy with sentinel lymph node mapping. Patient is interested reconstruction. Refer to plastic surgery. Once patient decides about reconstruction, we'll schedule for right simple mastectomy with sentinel lymph node mapping.The surgical and non surgical options have been discussed with the patient.  Risks of surgery include bleeding,  Infection,  Flap necrosis,  Tissue loss,  Chronic pain, death, Numbness,  And the need for additional procedures.  Reconstruction options also have been discussed with the patient as well.  The patient agrees to proceed.Sentinel lymph node mapping and dissection has been discussed with the patient.  Risk of bleeding,  Infection,  Seroma formation,  Additional procedures,,  Shoulder weakness ,  Shoulder stiffness,  Nerve and blood vessel injury and reaction to the mapping dyes have been discussed.  Alternatives to surgery have been discussed with the patient.  The patient agrees to proceed.       Netty Sullivant A.

## 2013-05-21 ENCOUNTER — Encounter (HOSPITAL_COMMUNITY): Payer: Self-pay | Admitting: General Practice

## 2013-05-21 MED ORDER — RANITIDINE HCL 150 MG/10ML PO SYRP
150.0000 mg | ORAL_SOLUTION | Freq: Two times a day (BID) | ORAL | Status: DC
  Administered 2013-05-21 – 2013-05-23 (×5): 150 mg via ORAL
  Filled 2013-05-21 (×7): qty 10

## 2013-05-21 MED ORDER — SODIUM CHLORIDE 0.9 % IJ SOLN: 3.0000 mL | Freq: Two times a day (BID) | INTRAMUSCULAR | Status: DC

## 2013-05-21 MED ORDER — SODIUM CHLORIDE 0.9 % IJ SOLN: 3.0000 mL | INTRAMUSCULAR | Status: DC | PRN

## 2013-05-21 MED ORDER — HYDROCODONE-ACETAMINOPHEN 5-325 MG PO TABS
1.0000 | ORAL_TABLET | Freq: Four times a day (QID) | ORAL | Status: DC | PRN
  Administered 2013-05-22 – 2013-05-23 (×5): 1 via ORAL
  Filled 2013-05-21 (×5): qty 1

## 2013-05-21 MED ORDER — DIPHENHYDRAMINE HCL 25 MG PO CAPS
25.0000 mg | ORAL_CAPSULE | Freq: Three times a day (TID) | ORAL | Status: DC | PRN
  Administered 2013-05-21: 25 mg via ORAL
  Filled 2013-05-21: qty 1

## 2013-05-21 MED ORDER — SODIUM CHLORIDE 0.9 % IV SOLN
250.0000 mL | INTRAVENOUS | Status: DC | PRN
Start: 2013-05-21 — End: 2013-05-22

## 2013-05-21 NOTE — Progress Notes (Signed)
1 Day Post-Op  Subjective: Doing well.   Objective: Vital signs in last 24 hours: Temp:  [97.7 F (36.5 C)-98.3 F (36.8 C)] 98 F (36.7 C) (02/26 0517) Pulse Rate:  [59-96] 68 (02/26 0517) Resp:  [8-21] 15 (02/26 0517) BP: (100-158)/(50-84) 119/70 mmHg (02/26 0517) SpO2:  [82 %-100 %] 98 % (02/26 0517) Last BM Date: 05/20/13  Intake/Output from previous day: 02/25 0701 - 02/26 0700 In: 5417.8 [I.V.:5167.8; IV Piggyback:250] Out: 650 [Urine:160; Drains:450; Blood:40] Intake/Output this shift:    Incision/Wound:right mastectomy flaps viable.  Some mild erythema noted. No hematoma.   Lab Results:   Recent Labs  05/18/13 1419 05/20/13 1830  WBC 7.8 10.2  HGB 14.3 12.2  HCT 42.2 36.5  PLT 331 295   BMET  Recent Labs  05/18/13 1419 05/20/13 1830  NA 143 139  K 4.2 5.1  CL 103 102  CO2 27 25  GLUCOSE 96 202*  BUN 11 12  CREATININE 0.79 0.69  CALCIUM 10.7* 8.6   PT/INR No results found for this basename: LABPROT, INR,  in the last 72 hours ABG No results found for this basename: PHART, PCO2, PO2, HCO3,  in the last 72 hours  Studies/Results: Nm Sentinel Node Inj-no Rpt (breast)  05/20/2013   CLINICAL DATA: DCIS   Sulfur colloid was injected intradermally by the nuclear medicine  technologist for breast cancer sentinel node localization.     Anti-infectives: Anti-infectives   Start     Dose/Rate Route Frequency Ordered Stop   05/21/13 0000  ciprofloxacin (CIPRO) IVPB 400 mg     400 mg 200 mL/hr over 60 Minutes Intravenous Every 12 hours 05/20/13 1602     05/20/13 1231  polymyxin B 500,000 Units, bacitracin 50,000 Units in sodium chloride irrigation 0.9 % 500 mL irrigation  Status:  Discontinued       As needed 05/20/13 1231 05/20/13 1251   05/20/13 0600  vancomycin (VANCOCIN) IVPB 1000 mg/200 mL premix     1,000 mg 200 mL/hr over 60 Minutes Intravenous On call to O.R. 05/19/13 1409 05/20/13 0830      Assessment/Plan: s/p Procedure(s): RIGHT  SIMPLEMASTECTOMY AND SENTINEL NODE   Sampling (Right) IMMEDIATE RIGHT BREAST RECONSTRUCTION WITH LATISSAMUS MUSCLE FLAP AND PLACEMENT OF TISSUE EXPANDER TO RIGHT BREAST (Right) Looks well. Discharge per Dr Migdalia Dk. D/C IVF and PCA.  LOS: 1 day    Bronislaw Switzer A. 05/21/2013

## 2013-05-21 NOTE — Progress Notes (Signed)
1 Day Post-Op  Subjective: Post op day 1 from surgery and doing very well.  Up to chair.    Objective: Vital signs in last 24 hours: Temp:  [97.7 F (36.5 C)-98.3 F (36.8 C)] 98 F (36.7 C) (02/26 0517) Pulse Rate:  [59-96] 68 (02/26 0517) Resp:  [8-21] 15 (02/26 0517) BP: (100-158)/(50-84) 119/70 mmHg (02/26 0517) SpO2:  [82 %-100 %] 98 % (02/26 0517) Last BM Date: 05/20/13  Intake/Output from previous day: 02/25 0701 - 02/26 0700 In: 5417.8 [I.V.:5167.8; IV Piggyback:250] Out: 650 [Urine:160; Drains:450; Blood:40] Intake/Output this shift:    General appearance: alert, cooperative and no distress Head: Normocephalic, without obvious abnormality, atraumatic Skin: mild rash.  Lab Results:   Recent Labs  05/18/13 1419 05/20/13 1830  WBC 7.8 10.2  HGB 14.3 12.2  HCT 42.2 36.5  PLT 331 295   BMET  Recent Labs  05/18/13 1419 05/20/13 1830  NA 143 139  K 4.2 5.1  CL 103 102  CO2 27 25  GLUCOSE 96 202*  BUN 11 12  CREATININE 0.79 0.69  CALCIUM 10.7* 8.6   PT/INR No results found for this basename: LABPROT, INR,  in the last 72 hours ABG No results found for this basename: PHART, PCO2, PO2, HCO3,  in the last 72 hours  Studies/Results: Nm Sentinel Node Inj-no Rpt (breast)  05/20/2013   CLINICAL DATA: DCIS   Sulfur colloid was injected intradermally by the nuclear medicine  technologist for breast cancer sentinel node localization.     Anti-infectives: Anti-infectives   Start     Dose/Rate Route Frequency Ordered Stop   05/21/13 0000  ciprofloxacin (CIPRO) IVPB 400 mg     400 mg 200 mL/hr over 60 Minutes Intravenous Every 12 hours 05/20/13 1602     05/20/13 1231  polymyxin B 500,000 Units, bacitracin 50,000 Units in sodium chloride irrigation 0.9 % 500 mL irrigation  Status:  Discontinued       As needed 05/20/13 1231 05/20/13 1251   05/20/13 0600  vancomycin (VANCOCIN) IVPB 1000 mg/200 mL premix     1,000 mg 200 mL/hr over 60 Minutes Intravenous On  call to O.R. 05/19/13 1409 05/20/13 0830      Assessment/Plan: s/p Procedure(s): RIGHT SIMPLEMASTECTOMY AND SENTINEL NODE   Sampling (Right) IMMEDIATE RIGHT BREAST RECONSTRUCTION WITH LATISSAMUS MUSCLE FLAP AND PLACEMENT OF TISSUE EXPANDER TO RIGHT BREAST (Right) d/c PCA and add PRN pain meds and meds for rash.  LOS: 1 day    [facility] 05/21/2013

## 2013-05-22 ENCOUNTER — Encounter (HOSPITAL_COMMUNITY): Payer: Self-pay | Admitting: Surgery

## 2013-05-22 MED ORDER — CIPROFLOXACIN HCL 500 MG PO TABS
500.0000 mg | ORAL_TABLET | Freq: Two times a day (BID) | ORAL | Status: DC
  Administered 2013-05-22 – 2013-05-23 (×2): 500 mg via ORAL
  Filled 2013-05-22 (×3): qty 1

## 2013-05-23 MED ORDER — CIPROFLOXACIN HCL 500 MG PO TABS: 500.0000 mg | ORAL_TABLET | Freq: Two times a day (BID) | ORAL | Status: DC

## 2013-05-23 MED ORDER — DIPHENHYDRAMINE HCL 25 MG PO CAPS: 25.0000 mg | ORAL_CAPSULE | Freq: Three times a day (TID) | ORAL | Status: DC | PRN

## 2013-05-23 MED ORDER — HYDROCODONE-ACETAMINOPHEN 5-325 MG PO TABS: 1.0000 | ORAL_TABLET | Freq: Four times a day (QID) | ORAL | Status: DC | PRN

## 2013-05-23 MED ORDER — SENNOSIDES-DOCUSATE SODIUM 8.6-50 MG PO TABS: 1.0000 | ORAL_TABLET | Freq: Every evening | ORAL | Status: DC | PRN

## 2013-05-23 MED ORDER — OXYCODONE HCL ER 10 MG PO T12A: 10.0000 mg | EXTENDED_RELEASE_TABLET | Freq: Two times a day (BID) | ORAL | Status: DC

## 2013-05-23 MED ORDER — DSS 100 MG PO CAPS: 100.0000 mg | ORAL_CAPSULE | Freq: Three times a day (TID) | ORAL | Status: DC

## 2013-05-23 MED ORDER — DIAZEPAM 2 MG PO TABS: 2.0000 mg | ORAL_TABLET | Freq: Two times a day (BID) | ORAL | Status: DC | PRN

## 2013-05-23 MED ORDER — BISACODYL 5 MG PO TBEC: 5.0000 mg | DELAYED_RELEASE_TABLET | Freq: Every day | ORAL | Status: DC | PRN

## 2013-05-23 NOTE — Progress Notes (Signed)
3 Days Post-Op  Subjective: Doing well this am. Did well with PT and ready for discharge home with family.  The right latissimus flap is clean, dry and intact and viable and without signs of infection or hematoma. JP drainage as expected.   Objective: Vital signs in last 24 hours: Temp:  [98.1 F (36.7 C)-98.8 F (37.1 C)] 98.1 F (36.7 C) (02/28 0449) Pulse Rate:  [69-83] 69 (02/28 0449) Resp:  [16-18] 16 (02/28 0449) BP: (109-129)/(63-74) 129/66 mmHg (02/28 0449) SpO2:  [96 %-97 %] 96 % (02/28 0449) Last BM Date: 05/20/13  Intake/Output from previous day: 02/27 0701 - 02/28 0700 In: 653.8 [P.O.:600; I.V.:53.8] Out: 160 [Drains:160] Intake/Output this shift:    General appearance: alert, cooperative, appears stated age and mild distress Back: Right back incision clean, dry and intact, without signs of infection. JP's intact Resp: clear to auscultation bilaterally Cardio: regular rate and rhythm Right latissimus flap viable and without signs of infection or hematoma  Lab Results:   Recent Labs  05/20/13 1830  WBC 10.2  HGB 12.2  HCT 36.5  PLT 295   BMET  Recent Labs  05/20/13 1830  NA 139  K 5.1  CL 102  CO2 25  GLUCOSE 202*  BUN 12  CREATININE 0.69  CALCIUM 8.6   PT/INR No results found for this basename: LABPROT, INR,  in the last 72 hours ABG No results found for this basename: PHART, PCO2, PO2, HCO3,  in the last 72 hours  Studies/Results: No results found.  Anti-infectives: Anti-infectives   Start     Dose/Rate Route Frequency Ordered Stop   05/22/13 2000  ciprofloxacin (CIPRO) tablet 500 mg     500 mg Oral 2 times daily 05/22/13 1155     05/21/13 0000  ciprofloxacin (CIPRO) IVPB 400 mg  Status:  Discontinued     400 mg 200 mL/hr over 60 Minutes Intravenous Every 12 hours 05/20/13 1602 05/22/13 1155   05/20/13 1231  polymyxin B 500,000 Units, bacitracin 50,000 Units in sodium chloride irrigation 0.9 % 500 mL irrigation  Status:  Discontinued        As needed 05/20/13 1231 05/20/13 1251   05/20/13 0600  vancomycin (VANCOCIN) IVPB 1000 mg/200 mL premix     1,000 mg 200 mL/hr over 60 Minutes Intravenous On call to O.R. 05/19/13 1409 05/20/13 0830      Assessment/Plan: s/p Procedure(s): RIGHT SIMPLEMASTECTOMY AND SENTINEL NODE   Sampling (Right) IMMEDIATE RIGHT BREAST RECONSTRUCTION WITH LATISSAMUS MUSCLE FLAP AND PLACEMENT OF TISSUE EXPANDER TO RIGHT BREAST (Right) Discharge  LOS: 3 days    Tyaisha Cullom,PA-C Plastic Surgery [phone]

## 2013-05-23 NOTE — Discharge Summary (Signed)
Physician Discharge Summary  Patient ID: RAYNELL UPTON MRN: [id number] DOB/AGE: September 01, 1950 63 y.o.  Admit date: 05/20/2013 Discharge date: 05/23/2013  Admission Diagnoses: Breast cancer, right side  Discharge Diagnoses:  Active Problems:   Breast cancer   Discharged Condition: good  Hospital Course: HPI: The patient is a 63 yrs old female here for treatment for right breast cancer with immediate right breast reconstruction with expander placement and latissimus muscle flap.She has recurrent right breast DCIS diagnosed on mammography and by core biopsy in November 2014. One year ago, she was diagnosed with right breast DCIS and underwent breast conservation surgery, radiation therapy and was placed on tamoxifen. Suspicious microcalcifications were noted on her mammogram this year and core biopsy showed DCIS in her lumpectomy bed. She has a history of anemia, arthritis, thyroid disease, breast cancer and right lung cancer. The patient underwent a partial lung resection, appendectomy, nasal surgery, oral surgery and partial mastectomy. The tissue from 03/2012 and 01/2013 was ER/PR positive. There is a strong family history of breast cancer. She was a smoker and quit in 2006 and has been treated for lung cancer.  She was admitted for right simple mastectomy with sentinel lymph node removal and immediate right breast reconstruction with right latissimus flap. She did well post operatively and is stable for discharge home with family.    Consults: None  Treatments: surgery:    Operative Note    Op Note by Theodoro Kos, DO at 05/20/2013  9:26 AM Version 1 of 1    DATE OF OPERATION: 05/20/2013  LOCATION: [facility]  PREOPERATIVE DIAGNOSIS: Right breast Cancer   POSTOPERATIVE DIAGNOSIS: Same  PROCEDURE: 1. Latissimus myocutaneous flap to reconstruct the right breast CPT 19361 2. Tissue expander placement to right breast CPT 19357 (210/350cc medium height mentor expander)             Op Note by Joyice Faster. Cornett, MD at 05/20/2013 10:26 AM Version 1 of 1    Preoperative diagnosis: Recurrent right breast DCIS  Postoperative diagnosis: Same  Procedure: Right simple mastectomy with sentinel lymph node mapping using methylene blue dye  Surgeon: Erroll Luna M.D.  Anesthesia: Gen. Endotracheal anesthesia  EBL: 50 cc  Specimens: Right breast and 2 right axillary sentinel lymph nodes to pathology            Discharge Exam: Blood pressure 129/66, pulse 69, temperature 98.1 F (36.7 C), temperature source Oral, resp. rate 16, height 5\' 4"  (1.626 m), weight 69.582 kg (153 lb 6.4 oz), SpO2 96.00%. General appearance: alert, cooperative, appears stated age and mild distress Back: Right back incision is clean, dry and intact and without signs of infection Resp: clear to auscultation bilaterally Cardio: regular rate and rhythm GI: soft, non-tender; bowel sounds normal; no masses,  no organomegaly Right breast/latissimus flap with good color, viable and no signs of infection or hematoma  Disposition: 01-Home or Self Care     Medication List         acetaminophen 500 MG tablet  Commonly known as:  TYLENOL  Take 1,000 mg by mouth every 6 (six) hours as needed (arthritis).     bisacodyl 5 MG EC tablet  Commonly known as:  DULCOLAX  Take 1 tablet (5 mg total) by mouth daily as needed for moderate constipation.     ciprofloxacin 500 MG tablet  Commonly known as:  CIPRO  Take 1 tablet (500 mg total) by mouth 2 (two) times daily.     cycloSPORINE 0.05 %  ophthalmic emulsion  Commonly known as:  RESTASIS  1 drop 2 (two) times daily.     diazepam 2 MG tablet  Commonly known as:  VALIUM  Take 1 tablet (2 mg total) by mouth every 12 (twelve) hours as needed for muscle spasms.     diphenhydrAMINE 25 mg capsule  Commonly known as:  BENADRYL  Take 1 capsule (25 mg total) by mouth every 8 (eight) hours as needed for itching.     DSS 100 MG Caps  Take 100 mg by  mouth 3 (three) times daily.     ECHINACEA PO  Take 1 tablet by mouth as needed (cold).     fluticasone 50 MCG/ACT nasal spray  Commonly known as:  FLONASE  Place 2 sprays into both nostrils daily.     guaifenesin 400 MG Tabs tablet  Commonly known as:  HUMIBID E  Take 400 mg by mouth as needed (cold).     HYDROcodone-acetaminophen 5-325 MG per tablet  Commonly known as:  NORCO/VICODIN  Take 1 tablet by mouth every 6 (six) hours as needed for moderate pain.     multivitamin tablet  Take 1 tablet by mouth daily.     OVER THE COUNTER MEDICATION  Take 1 Syringe by mouth 2 (two) times daily. Salt water rinse for tumor in mouth     OxyCODONE 10 mg T12a 12 hr tablet  Commonly known as:  OXYCONTIN  Take 1 tablet (10 mg total) by mouth every 12 (twelve) hours.     PATANOL 0.1 % ophthalmic solution  Generic drug:  olopatadine  Place 1 drop into both eyes as needed for allergies.     PROBIOTIC & ACIDOPHILUS EX ST PO  Take 1 capsule by mouth daily as needed (digestion).     pseudoephedrine 30 MG tablet  Commonly known as:  SUDAFED  Take 30 mg by mouth as needed for congestion.     senna-docusate 8.6-50 MG per tablet  Commonly known as:  Senokot-S  Take 1 tablet by mouth at bedtime as needed for mild constipation.     SYSTANE OP  Apply 1 drop to eye as needed (dry eyes).     VITAMIN C PO  Take 500 mg by mouth as needed (cold).     Zinc 50 MG Tabs  Take 50 mg by mouth as needed (cold).           Follow-up Information   Follow up with [facility], DO On 05/29/2013. (call to make an appointment for Friday)    Specialty:  Plastic Surgery   Contact information:   [place] [address] [phone]       Follow up with CORNETT,THOMAS A., MD In 2 weeks.   Specialty:  General Surgery   Contact information:   [address] [place] [address] [phone]       Follow up with CORNETT,THOMAS A., MD In 2 weeks.   Specialty:  General  Surgery   Contact information:   [address] [place] [address] [phone]       Signed: [facility] Plastic Surgery [phone]

## 2013-05-23 NOTE — Evaluation (Signed)
Physical Therapy Evaluation Patient Details Name: [name] MRN: [id number] DOB: 15-Oct-1950 Today's Date: 05/23/2013 Time: 5361-4431 PT Time Calculation (min): 27 min  PT Assessment / Plan / Recommendation History of Present Illness  Patient is a 63 yo female s/p Rt mastectomy with reconstruction.  Clinical Impression  Patient is functioning at supervision level for ambulation (safety only).  Provided instruction and practice with bed mobility.  Provided patient with education on safe movement of RUE, and short-term limitations.  Encouraged patient to continue to use RUE and continue to move.  Encouraged elevation for any edema from surgery.  Answered all questions from patient, her husband and daughter.  Patient ready for discharge from PT perspective.    PT Assessment  Patent does not need any further PT services    Follow Up Recommendations  No PT follow up;Supervision - Intermittent    Does the patient have the potential to tolerate intense rehabilitation      Barriers to Discharge        Equipment Recommendations  None recommended by PT    Recommendations for Other Services     Frequency      Precautions / Restrictions Precautions Precaution Comments: Instructed patient to limit end-range movements, and minimize lifting with RUE Restrictions Weight Bearing Restrictions: No   Pertinent Vitals/Pain       Mobility  Bed Mobility Overal bed mobility: Modified Independent General bed mobility comments: Verbal cues for moving supine <> sit.  Patient able to complete more easily using pantslegs to pull self up to sitting with LUE.  Difficulty rolling due to back/shoulder pain. Transfers Overall transfer level: Independent Equipment used: None Ambulation/Gait Ambulation/Gait assistance: Supervision Assistive device: None Gait Pattern/deviations: WFL(Within Functional Limits) General Gait Details: Supervision to ambulate on unit in hallway for safety.         PT Goals(Current goals can be found in the care plan section)    Visit Information  Last PT Received On: 05/23/13 Assistance Needed: +1 History of Present Illness: Patient is a 63 yo female s/p Rt mastectomy with reconstruction.       Prior [place] expects to be discharged to:: Private residence Living Arrangements: Spouse/significant other Available Help at Discharge: Family;Available 24 hours/day Type of Home: House Home Equipment: None Prior Function Level of Independence: Independent Communication Communication: No difficulties Dominant Hand: Right    Cognition  Cognition Arousal/Alertness: Awake/alert Behavior During Therapy: WFL for tasks assessed/performed Overall Cognitive Status: Within Functional Limits for tasks assessed    Extremity/Trunk Assessment Upper Extremity Assessment Upper Extremity Assessment: RUE deficits/detail RUE Deficits / Details: Decreased strength and ROM due to surgery. RUE: Unable to fully assess due to pain Lower Extremity Assessment Lower Extremity Assessment: Overall WFL for tasks assessed   Balance Balance Overall balance assessment: Independent  End of Session PT - End of Session Activity Tolerance: Patient tolerated treatment well Patient left: in bed;with call bell/phone within reach;with family/visitor present Nurse Communication: Mobility status  GP     Despina Pole 05/23/2013, 12:19 PM Carita Pian. Sanjuana Kava, [place] Pager [phone]

## 2013-05-23 NOTE — Progress Notes (Signed)
DC instructions reviewed with patient, questions answered, verbalized understanding.  Patient transported to front of hospital via wheelchair accompanied by NT to be taken home by family.  Instructions given on how to empty JP as well as material for dressing changes.  Patient in good condition upon leaving [facility].

## 2013-05-25 NOTE — Progress Notes (Signed)
Agree with note. 

## 2013-05-25 NOTE — Discharge Summary (Signed)
Agree with the plan.

## 2013-05-26 ENCOUNTER — Telehealth (INDEPENDENT_AMBULATORY_CARE_PROVIDER_SITE_OTHER): Payer: Self-pay

## 2013-05-26 NOTE — Telephone Encounter (Signed)
Called pt with path report. Clear margins and neg nodes. Made appt for Monday.

## 2013-06-01 ENCOUNTER — Encounter (INDEPENDENT_AMBULATORY_CARE_PROVIDER_SITE_OTHER): Payer: Self-pay | Admitting: Surgery

## 2013-06-01 ENCOUNTER — Ambulatory Visit (INDEPENDENT_AMBULATORY_CARE_PROVIDER_SITE_OTHER): Payer: 59 | Admitting: Surgery

## 2013-06-01 VITALS — BP 124/80 | HR 76 | Temp 98.1°F | Resp 16 | Ht 64.0 in | Wt 154.0 lb

## 2013-06-01 DIAGNOSIS — Z9889 Other specified postprocedural states: Secondary | ICD-10-CM | POA: Insufficient documentation

## 2013-06-01 NOTE — Progress Notes (Signed)
ROMONA MURDY    [id number] 06/01/2013    1951-01-29   CC:   Chief Complaint  Patient presents with  . Routine Post Op     HPI:  The patient returns for post op follow-up. She underwent a right simple mastectomy and reconstruction.  on 05/20/2013. Over all she feels that she is doing well.   PE: VITAL SIGNS: BP 124/80  Pulse 76  Temp(Src) 98.1 F (36.7 C) (Oral)  Resp 16  Ht 5\' 4"  (1.626 m)  Wt 154 lb (69.854 kg)  BMI 26.42 kg/m2  Breast: The incision is healing nicely and there is no evidence of infection or hematoma.  The drains are in place.  DATA REVIEWED: Pathology report: 1. Breast, simple mastectomy, Right - RESIDUAL DUCTAL CARCINOMA IN SITU. - MARGINS ARE NEGATIVE. - SEE COMMENT. 2. Lymph node, sentinel, biopsy, Right axilla #1 - ONE BENIGN LYMPH NODE WITH NO TUMOR SEEN (0/1). 3. Fatty tissue, Right breast flap - BENIGN FIBROFATTY SOFT TISSUE. - NO TUMOR SEEN. 4. Fatty tissue, excess mastectomy fat - BENIGN FIBROFATTY SOFT TISSUE. - NO TUMOR SEEN. Microscopic Comment 1. Within the previous lumpectomy site there are two residual foci of ductal carcinoma in situ measuring 0.1 cm and <0.1 cm in greatest dimension. The ductal carcinoma in situ appears high grade based on the cytologic atypia present. The ductal carcinoma in situ is at least 0.5 cm away from the deep margin. As the previous quantitative estrogen and progesterone markers were both positive (Please see case [id number] for full hormonal receptor status), an additional quantitative estrogen receptor or progesterone receptor marker will not be performed on the current case unless otherwise requested. The surrounding tissue demonstrates hyalinized fibrosis with stromal calcifications and fat necrosis. An inflammatory cavity and giant cell reaction is identified. The findings represent biopsy site reaction. Fibrocystic, columnar cell change and benign ducts with associated calcification are also  present within the specimen. (RH:ecj 05/22/2013) Willeen Niece MD Pathologist, Electronic Signature (Case signed 05/25/2013  IMPRESSION: Patient doing well. 12 days post op right simple mastectomy for DCIS with reconstruction per DR [facility].  Continue plastics follow up.    PLAN: Her next visit will be in 6 months. Continue follow up with Dr Migdalia Dk.

## 2013-06-01 NOTE — Patient Instructions (Signed)
RETURN 6 MONTHS.  SOONER IF  PROBLEMS ARISE.

## 2013-10-15 ENCOUNTER — Encounter (HOSPITAL_BASED_OUTPATIENT_CLINIC_OR_DEPARTMENT_OTHER): Payer: Self-pay | Admitting: *Deleted

## 2013-10-16 ENCOUNTER — Encounter (HOSPITAL_BASED_OUTPATIENT_CLINIC_OR_DEPARTMENT_OTHER): Payer: Self-pay | Admitting: *Deleted

## 2013-10-22 ENCOUNTER — Encounter (HOSPITAL_BASED_OUTPATIENT_CLINIC_OR_DEPARTMENT_OTHER): Payer: 59 | Admitting: Anesthesiology

## 2013-10-22 ENCOUNTER — Other Ambulatory Visit: Payer: Self-pay | Admitting: Plastic Surgery

## 2013-10-22 ENCOUNTER — Encounter (HOSPITAL_BASED_OUTPATIENT_CLINIC_OR_DEPARTMENT_OTHER)
Admission: RE | Disposition: A | Discharge status: Home / Self Care | Payer: Self-pay | Source: Ambulatory Visit | Attending: Plastic Surgery

## 2013-10-22 ENCOUNTER — Ambulatory Visit (HOSPITAL_BASED_OUTPATIENT_CLINIC_OR_DEPARTMENT_OTHER): Payer: 59 | Admitting: Anesthesiology

## 2013-10-22 ENCOUNTER — Encounter (HOSPITAL_BASED_OUTPATIENT_CLINIC_OR_DEPARTMENT_OTHER): Payer: Self-pay | Admitting: Anesthesiology

## 2013-10-22 ENCOUNTER — Ambulatory Visit (HOSPITAL_BASED_OUTPATIENT_CLINIC_OR_DEPARTMENT_OTHER)
Admission: RE | Admit: 2013-10-22 | Discharge: 2013-10-22 | Disposition: A | Discharge status: Home / Self Care | Payer: 59 | Source: Ambulatory Visit | Attending: Plastic Surgery | Admitting: Plastic Surgery

## 2013-10-22 DIAGNOSIS — Z902 Acquired absence of lung [part of]: Secondary | ICD-10-CM | POA: Diagnosis not present

## 2013-10-22 DIAGNOSIS — Z853 Personal history of malignant neoplasm of breast: Secondary | ICD-10-CM | POA: Diagnosis not present

## 2013-10-22 DIAGNOSIS — M1990 Unspecified osteoarthritis, unspecified site: Secondary | ICD-10-CM | POA: Insufficient documentation

## 2013-10-22 DIAGNOSIS — Z87891 Personal history of nicotine dependence: Secondary | ICD-10-CM | POA: Insufficient documentation

## 2013-10-22 DIAGNOSIS — F411 Generalized anxiety disorder: Secondary | ICD-10-CM | POA: Diagnosis not present

## 2013-10-22 DIAGNOSIS — Z9011 Acquired absence of right breast and nipple: Secondary | ICD-10-CM

## 2013-10-22 DIAGNOSIS — M5030 Other cervical disc degeneration, unspecified cervical region: Secondary | ICD-10-CM | POA: Insufficient documentation

## 2013-10-22 DIAGNOSIS — N393 Stress incontinence (female) (male): Secondary | ICD-10-CM | POA: Insufficient documentation

## 2013-10-22 DIAGNOSIS — Z170 Estrogen receptor positive status [ER+]: Secondary | ICD-10-CM | POA: Diagnosis not present

## 2013-10-22 DIAGNOSIS — T8544XA Capsular contracture of breast implant, initial encounter: Secondary | ICD-10-CM | POA: Diagnosis not present

## 2013-10-22 DIAGNOSIS — N651 Disproportion of reconstructed breast: Secondary | ICD-10-CM | POA: Diagnosis not present

## 2013-10-22 DIAGNOSIS — Z9010 Acquired absence of unspecified breast and nipple: Secondary | ICD-10-CM | POA: Insufficient documentation

## 2013-10-22 DIAGNOSIS — E0500 Thyrotoxicosis with diffuse goiter without thyrotoxic crisis or storm: Secondary | ICD-10-CM | POA: Insufficient documentation

## 2013-10-22 DIAGNOSIS — C069 Malignant neoplasm of mouth, unspecified: Secondary | ICD-10-CM | POA: Diagnosis not present

## 2013-10-22 DIAGNOSIS — Z85118 Personal history of other malignant neoplasm of bronchus and lung: Secondary | ICD-10-CM | POA: Insufficient documentation

## 2013-10-22 DIAGNOSIS — K219 Gastro-esophageal reflux disease without esophagitis: Secondary | ICD-10-CM | POA: Diagnosis not present

## 2013-10-22 DIAGNOSIS — N6489 Other specified disorders of breast: Secondary | ICD-10-CM

## 2013-10-22 DIAGNOSIS — Z803 Family history of malignant neoplasm of breast: Secondary | ICD-10-CM | POA: Diagnosis not present

## 2013-10-22 DIAGNOSIS — Z923 Personal history of irradiation: Secondary | ICD-10-CM | POA: Insufficient documentation

## 2013-10-22 DIAGNOSIS — L989 Disorder of the skin and subcutaneous tissue, unspecified: Secondary | ICD-10-CM | POA: Diagnosis not present

## 2013-10-22 HISTORY — DX: Stress incontinence (female) (male): N39.3

## 2013-10-22 HISTORY — DX: Gastro-esophageal reflux disease without esophagitis: K21.9

## 2013-10-22 HISTORY — PX: REMOVAL OF TISSUE EXPANDER AND PLACEMENT OF IMPLANT: SHX6457

## 2013-10-22 HISTORY — DX: Dermatitis, unspecified: L30.9

## 2013-10-22 HISTORY — DX: Personal history of malignant neoplasm of breast: Z85.3

## 2013-10-22 HISTORY — DX: Headache, unspecified: R51.9

## 2013-10-22 HISTORY — DX: Thyrotoxicosis with toxic single thyroid nodule without thyrotoxic crisis or storm: E05.10

## 2013-10-22 HISTORY — DX: Personal history of other malignant neoplasm of bronchus and lung: Z85.118

## 2013-10-22 HISTORY — PX: BREAST REDUCTION WITH MASTOPEXY: SHX6465

## 2013-10-22 HISTORY — DX: Headache: R51

## 2013-10-22 LAB — POCT HEMOGLOBIN-HEMACUE: Hemoglobin: 14.6 g/dL (ref 12.0–15.0)

## 2013-10-22 SURGERY — REMOVAL, TISSUE EXPANDER, BREAST, WITH IMPLANT INSERTION
Anesthesia: General | Site: Breast | Laterality: Right

## 2013-10-22 MED ORDER — CIPROFLOXACIN IN D5W 400 MG/200ML IV SOLN: 400.0000 mg | INTRAVENOUS | Status: DC

## 2013-10-22 MED ORDER — LACTATED RINGERS IV SOLN
INTRAVENOUS | Status: DC | PRN
  Administered 2013-10-22: 250 mL via INTRAVENOUS

## 2013-10-22 MED ORDER — HYDROMORPHONE HCL PF 1 MG/ML IJ SOLN
0.2500 mg | INTRAMUSCULAR | Status: DC | PRN
  Administered 2013-10-22: 0.5 mg via INTRAVENOUS
  Administered 2013-10-22 (×2): 0.25 mg via INTRAVENOUS

## 2013-10-22 MED ORDER — FENTANYL CITRATE 0.05 MG/ML IJ SOLN: 50.0000 ug | INTRAMUSCULAR | Status: DC | PRN

## 2013-10-22 MED ORDER — PROPOFOL 10 MG/ML IV EMUL
INTRAVENOUS | Status: AC
  Filled 2013-10-22: qty 50

## 2013-10-22 MED ORDER — SUCCINYLCHOLINE CHLORIDE 20 MG/ML IJ SOLN
INTRAMUSCULAR | Status: AC
  Filled 2013-10-22: qty 1

## 2013-10-22 MED ORDER — BUPIVACAINE-EPINEPHRINE (PF) 0.25% -1:200000 IJ SOLN
INTRAMUSCULAR | Status: AC
  Filled 2013-10-22: qty 60

## 2013-10-22 MED ORDER — LIDOCAINE HCL 1 % IJ SOLN
INTRAMUSCULAR | Status: DC | PRN
Start: 2013-10-22 — End: 2013-10-22
  Administered 2013-10-22: 10 mL

## 2013-10-22 MED ORDER — ONDANSETRON HCL 4 MG/2ML IJ SOLN
INTRAMUSCULAR | Status: DC | PRN
  Administered 2013-10-22: 4 mg via INTRAVENOUS

## 2013-10-22 MED ORDER — LIDOCAINE-EPINEPHRINE 1 %-1:100000 IJ SOLN
INTRAMUSCULAR | Status: AC
  Filled 2013-10-22: qty 2

## 2013-10-22 MED ORDER — SCOPOLAMINE 1 MG/3DAYS TD PT72
MEDICATED_PATCH | TRANSDERMAL | Status: AC
  Filled 2013-10-22: qty 1

## 2013-10-22 MED ORDER — MIDAZOLAM HCL 5 MG/5ML IJ SOLN
INTRAMUSCULAR | Status: DC | PRN
  Administered 2013-10-22: 2 mg via INTRAVENOUS

## 2013-10-22 MED ORDER — MIDAZOLAM HCL 2 MG/ML PO SYRP: 12.0000 mg | ORAL_SOLUTION | Freq: Once | ORAL | Status: DC | PRN

## 2013-10-22 MED ORDER — CIPROFLOXACIN IN D5W 400 MG/200ML IV SOLN
INTRAVENOUS | Status: AC
  Filled 2013-10-22: qty 200

## 2013-10-22 MED ORDER — SUCCINYLCHOLINE CHLORIDE 20 MG/ML IJ SOLN
INTRAMUSCULAR | Status: DC | PRN
  Administered 2013-10-22: 100 mg via INTRAVENOUS

## 2013-10-22 MED ORDER — OXYCODONE HCL 5 MG PO TABS
5.0000 mg | ORAL_TABLET | Freq: Once | ORAL | Status: AC | PRN
  Administered 2013-10-22: 5 mg via ORAL

## 2013-10-22 MED ORDER — PROPOFOL 10 MG/ML IV BOLUS
INTRAVENOUS | Status: DC | PRN
  Administered 2013-10-22: 150 mg via INTRAVENOUS

## 2013-10-22 MED ORDER — OXYCODONE HCL 5 MG/5ML PO SOLN: 5.0000 mg | Freq: Once | ORAL | Status: AC | PRN

## 2013-10-22 MED ORDER — FENTANYL CITRATE 0.05 MG/ML IJ SOLN
INTRAMUSCULAR | Status: AC
  Filled 2013-10-22: qty 2

## 2013-10-22 MED ORDER — CIPROFLOXACIN IN D5W 400 MG/200ML IV SOLN
INTRAVENOUS | Status: DC | PRN
  Administered 2013-10-22: 400 mg via INTRAVENOUS

## 2013-10-22 MED ORDER — MIDAZOLAM HCL 2 MG/2ML IJ SOLN
INTRAMUSCULAR | Status: AC
  Filled 2013-10-22: qty 2

## 2013-10-22 MED ORDER — METOCLOPRAMIDE HCL 5 MG/ML IJ SOLN: 10.0000 mg | Freq: Once | INTRAMUSCULAR | Status: DC | PRN

## 2013-10-22 MED ORDER — LIDOCAINE HCL (PF) 1 % IJ SOLN
INTRAMUSCULAR | Status: AC
  Filled 2013-10-22: qty 120

## 2013-10-22 MED ORDER — EPINEPHRINE HCL 1 MG/ML IJ SOLN
INTRAMUSCULAR | Status: AC
  Filled 2013-10-22: qty 1

## 2013-10-22 MED ORDER — FENTANYL CITRATE 0.05 MG/ML IJ SOLN
INTRAMUSCULAR | Status: AC
  Filled 2013-10-22: qty 6

## 2013-10-22 MED ORDER — BUPIVACAINE-EPINEPHRINE 0.25% -1:200000 IJ SOLN
INTRAMUSCULAR | Status: DC | PRN
  Administered 2013-10-22: 10 mL

## 2013-10-22 MED ORDER — METOCLOPRAMIDE HCL 5 MG/ML IJ SOLN
INTRAMUSCULAR | Status: DC | PRN
  Administered 2013-10-22: 10 mg via INTRAVENOUS

## 2013-10-22 MED ORDER — SODIUM CHLORIDE 0.9 % IR SOLN
Status: DC | PRN
  Administered 2013-10-22: 08:00:00

## 2013-10-22 MED ORDER — OXYCODONE HCL 5 MG PO TABS
ORAL_TABLET | ORAL | Status: AC
  Filled 2013-10-22: qty 1

## 2013-10-22 MED ORDER — FENTANYL CITRATE 0.05 MG/ML IJ SOLN
INTRAMUSCULAR | Status: DC | PRN
Start: 2013-10-22 — End: 2013-10-22
  Administered 2013-10-22: 50 ug via INTRAVENOUS
  Administered 2013-10-22: 100 ug via INTRAVENOUS
  Administered 2013-10-22 (×4): 50 ug via INTRAVENOUS

## 2013-10-22 MED ORDER — HYDROMORPHONE HCL PF 1 MG/ML IJ SOLN
INTRAMUSCULAR | Status: AC
  Filled 2013-10-22: qty 1

## 2013-10-22 MED ORDER — LACTATED RINGERS IV SOLN
INTRAVENOUS | Status: DC
  Administered 2013-10-22 (×2): via INTRAVENOUS

## 2013-10-22 MED ORDER — SCOPOLAMINE 1 MG/3DAYS TD PT72
MEDICATED_PATCH | TRANSDERMAL | Status: DC | PRN
  Administered 2013-10-22: 1 via TRANSDERMAL

## 2013-10-22 MED ORDER — FENTANYL CITRATE 0.05 MG/ML IJ SOLN
INTRAMUSCULAR | Status: AC
  Filled 2013-10-22: qty 4

## 2013-10-22 MED ORDER — EPINEPHRINE HCL 1 MG/ML IJ SOLN
INTRAMUSCULAR | Status: DC | PRN
  Administered 2013-10-22: 1 mg

## 2013-10-22 MED ORDER — DEXAMETHASONE SODIUM PHOSPHATE 4 MG/ML IJ SOLN
INTRAMUSCULAR | Status: DC | PRN
  Administered 2013-10-22: 10 mg via INTRAVENOUS

## 2013-10-22 MED ORDER — MIDAZOLAM HCL 2 MG/2ML IJ SOLN: 1.0000 mg | INTRAMUSCULAR | Status: DC | PRN

## 2013-10-22 SURGICAL SUPPLY — 85 items
ADH SKN CLS APL DERMABOND .7 (GAUZE/BANDAGES/DRESSINGS) ×6
BAG DECANTER FOR FLEXI CONT (MISCELLANEOUS) ×5 IMPLANT
BINDER BREAST LRG (GAUZE/BANDAGES/DRESSINGS) IMPLANT
BINDER BREAST MEDIUM (GAUZE/BANDAGES/DRESSINGS) IMPLANT
BINDER BREAST XLRG (GAUZE/BANDAGES/DRESSINGS) IMPLANT
BINDER BREAST XXLRG (GAUZE/BANDAGES/DRESSINGS) IMPLANT
BIOPATCH RED 1 DISK 7.0 (GAUZE/BANDAGES/DRESSINGS) IMPLANT
BIOPATCH RED 1IN DISK 7.0MM (GAUZE/BANDAGES/DRESSINGS)
BLADE HEX COATED 2.75 (ELECTRODE) ×5 IMPLANT
BLADE SURG 10 STRL SS (BLADE) ×5 IMPLANT
BLADE SURG 15 STRL LF DISP TIS (BLADE) ×3 IMPLANT
BLADE SURG 15 STRL SS (BLADE) ×5
BNDG GAUZE ELAST 4 BULKY (GAUZE/BANDAGES/DRESSINGS) ×10 IMPLANT
CANISTER LIPO FAT HARVEST (MISCELLANEOUS) ×5 IMPLANT
CANISTER SUCT 1200ML W/VALVE (MISCELLANEOUS) ×5 IMPLANT
CANNULA ASPIRATION (CANNULA) ×5 IMPLANT
CHLORAPREP W/TINT 26ML (MISCELLANEOUS) ×5 IMPLANT
CLOSURE WOUND 1/2 X4 (GAUZE/BANDAGES/DRESSINGS) ×1
COVER MAYO STAND STRL (DRAPES) ×5 IMPLANT
COVER TABLE BACK 60X90 (DRAPES) ×5 IMPLANT
DECANTER SPIKE VIAL GLASS SM (MISCELLANEOUS) IMPLANT
DERMABOND ADVANCED (GAUZE/BANDAGES/DRESSINGS) ×4
DERMABOND ADVANCED .7 DNX12 (GAUZE/BANDAGES/DRESSINGS) ×6 IMPLANT
DRAIN CHANNEL 19F RND (DRAIN) IMPLANT
DRAPE LAPAROSCOPIC ABDOMINAL (DRAPES) ×5 IMPLANT
DRSG PAD ABDOMINAL 8X10 ST (GAUZE/BANDAGES/DRESSINGS) ×10 IMPLANT
DRSG TEGADERM 2-3/8X2-3/4 SM (GAUZE/BANDAGES/DRESSINGS) IMPLANT
ELECT BLADE 4.0 EZ CLEAN MEGAD (MISCELLANEOUS) ×5
ELECT REM PT RETURN 9FT ADLT (ELECTROSURGICAL) ×5
ELECTRODE BLDE 4.0 EZ CLN MEGD (MISCELLANEOUS) ×3 IMPLANT
ELECTRODE REM PT RTRN 9FT ADLT (ELECTROSURGICAL) ×3 IMPLANT
EVACUATOR SILICONE 100CC (DRAIN) IMPLANT
FILTER LIPOSUCTION (MISCELLANEOUS) ×5 IMPLANT
GAUZE SPONGE 4X4 12PLY STRL (GAUZE/BANDAGES/DRESSINGS) IMPLANT
GLOVE BIO SURGEON STRL SZ 6.5 (GLOVE) ×14 IMPLANT
GLOVE BIO SURGEONS STRL SZ 6.5 (GLOVE) ×5
GLOVE SURG SS PI 7.0 STRL IVOR (GLOVE) ×3 IMPLANT
GOWN STRL REUS W/ TWL LRG LVL3 (GOWN DISPOSABLE) ×6 IMPLANT
GOWN STRL REUS W/TWL LRG LVL3 (GOWN DISPOSABLE) ×10
IMPL SILICONE 480CC (Breast) ×1 IMPLANT
IMPLANT SILICONE 480CC (Breast) ×5 IMPLANT
IV NS 1000ML (IV SOLUTION)
IV NS 1000ML BAXH (IV SOLUTION) IMPLANT
KIT FILL SYSTEM UNIVERSAL (SET/KITS/TRAYS/PACK) ×5 IMPLANT
LINER CANISTER 1000CC FLEX (MISCELLANEOUS) ×5 IMPLANT
NDL HYPO 25X1 1.5 SAFETY (NEEDLE) ×2 IMPLANT
NDL SAFETY ECLIPSE 18X1.5 (NEEDLE) ×3 IMPLANT
NDL SPNL 18GX3.5 QUINCKE PK (NEEDLE) ×2 IMPLANT
NEEDLE HYPO 18GX1.5 SHARP (NEEDLE) ×5
NEEDLE HYPO 25X1 1.5 SAFETY (NEEDLE) ×5 IMPLANT
NEEDLE SPNL 18GX3.5 QUINCKE PK (NEEDLE) ×5 IMPLANT
NS IRRIG 1000ML POUR BTL (IV SOLUTION) IMPLANT
PACK BASIN DAY SURGERY FS (CUSTOM PROCEDURE TRAY) ×5 IMPLANT
PAD ALCOHOL SWAB (MISCELLANEOUS) ×5 IMPLANT
PENCIL BUTTON HOLSTER BLD 10FT (ELECTRODE) ×5 IMPLANT
PIN SAFETY STERILE (MISCELLANEOUS) IMPLANT
SIZER BREAST GEL REUSE 480CC (SIZER) ×5
SIZER BRST GEL REUSE 480CC (SIZER) ×1 IMPLANT
SIZER GENERIC MENTOR (SIZER) ×3 IMPLANT
SLEEVE SCD COMPRESS KNEE MED (MISCELLANEOUS) ×5 IMPLANT
SPONGE GAUZE 4X4 12PLY STER LF (GAUZE/BANDAGES/DRESSINGS) IMPLANT
SPONGE LAP 18X18 X RAY DECT (DISPOSABLE) ×10 IMPLANT
STRIP CLOSURE SKIN 1/2X4 (GAUZE/BANDAGES/DRESSINGS) ×4 IMPLANT
SUT MNCRL AB 4-0 PS2 18 (SUTURE) ×12 IMPLANT
SUT MON AB 3-0 SH 27 (SUTURE) ×20
SUT MON AB 3-0 SH27 (SUTURE) ×6 IMPLANT
SUT MON AB 5-0 PS2 18 (SUTURE) ×10 IMPLANT
SUT PDS AB 2-0 CT2 27 (SUTURE) IMPLANT
SUT SILK 3 0 PS 1 (SUTURE) IMPLANT
SUT VIC AB 3-0 SH 27 (SUTURE) ×5
SUT VIC AB 3-0 SH 27X BRD (SUTURE) ×3 IMPLANT
SUT VIC AB 5-0 PS2 18 (SUTURE) ×5 IMPLANT
SUT VICRYL 4-0 PS2 18IN ABS (SUTURE) ×5 IMPLANT
SYR BULB IRRIGATION 50ML (SYRINGE) ×5 IMPLANT
SYRINGE 20CC LL (MISCELLANEOUS) IMPLANT
SYRINGE 60CC LL (MISCELLANEOUS) ×5 IMPLANT
SYRINGE CONTROL L 12CC (SYRINGE) ×5 IMPLANT
SYRINGE CONTROL LL 12CC (SYRINGE) ×2 IMPLANT
SYRINGE LUER LOK 1CC (MISCELLANEOUS) ×5 IMPLANT
TOWEL OR 17X24 6PK STRL BLUE (TOWEL DISPOSABLE) ×13 IMPLANT
TUBE CONNECTING 20'X1/4 (TUBING) ×1
TUBE CONNECTING 20X1/4 (TUBING) ×4 IMPLANT
TUBING SET GRADUATE ASPIR 12FT (MISCELLANEOUS) ×5 IMPLANT
UNDERPAD 30X30 INCONTINENT (UNDERPADS AND DIAPERS) ×10 IMPLANT
YANKAUER SUCT BULB TIP NO VENT (SUCTIONS) ×5 IMPLANT

## 2013-10-22 NOTE — Anesthesia Procedure Notes (Signed)
Procedure Name: Intubation Date/Time: 10/22/2013 7:51 AM Performed by: Melynda Ripple D Pre-anesthesia Checklist: Patient identified, Emergency Drugs available, Suction available and Patient being monitored Patient Re-evaluated:Patient Re-evaluated prior to inductionOxygen Delivery Method: Circle System Utilized Preoxygenation: Pre-oxygenation with 100% oxygen Intubation Type: IV induction Ventilation: Mask ventilation without difficulty Laryngoscope Size: Mac and 3 Grade View: Grade II Tube type: Oral Tube size: 7.0 mm Number of attempts: 1 Airway Equipment and Method: stylet and oral airway Placement Confirmation: ETT inserted through vocal cords under direct vision,  positive ETCO2 and breath sounds checked- equal and bilateral Secured at: 22 cm Tube secured with: Tape Dental Injury: Teeth and Oropharynx as per pre-operative assessment

## 2013-10-22 NOTE — Op Note (Addendum)
Op report Unilateral Breast Exchange   DATE OF OPERATION:  10/22/2013  LOCATION: [facility]  SURGICAL DIVISION: Plastic Surgery  PREOPERATIVE DIAGNOSES:  1. History of right breast cancer.  2. Acquired absence of right breast.  3. Capsular contracture of the right breast. 4. Breast asymmetry of the left breast. 5. Changing skin lesion of the abdomen.  POSTOPERATIVE DIAGNOSES:  1. History of right breast cancer.  2. Acquired absence of right breast.  3. Capsular contracture of the right breast. 4. Breast asymmetry of the left breast. 5. Changing skin lesion of the abdomen.  PROCEDURE:  1. Right exchange of tissue expander for implant.  2. Right capsulotomy for implant respositioning. 3. Liposuction of right breast for symmetry. 4. Left breast reduction mastopexy. 5. Liposuction of left breast for symmetry. 190gm 6. Excision of a changing skin lesion of the abdomen. 1 cm  SURGEON: [employer], DO  ASSISTANT: Shawn Rayburn, PA  ANESTHESIA:  General.   COMPLICATIONS: None.   IMPLANTS:  Mentor Smooth Round Ultra High Profile Gel 480cc. Ref #[id number].  Serial Number [id number]  INDICATIONS FOR PROCEDURE:  The patient, Joanny Dupree, is a 63 y.o. female born on 08-07-1950, is here for treatment for further treatment after a mastectomy and placement of a tissue expander. She now presents for exchange of her expander for an implant.  She requires capsulotomies to better position the implant. MRN: [id number]  CONSENT:  Informed consent was obtained directly from the patient. Risks, benefits and alternatives were fully discussed. Specific risks including but not limited to bleeding, infection, hematoma, seroma, scarring, pain, implant infection, implant extrusion, capsular contracture, asymmetry, wound healing problems, and need for further surgery were all discussed. The patient did have an ample opportunity to have her questions answered to her  satisfaction.   DESCRIPTION OF PROCEDURE:  The patient was taken to the operating room. SCDs were placed and IV antibiotics were given. The patient's chest was prepped and draped in a sterile fashion. A time out was performed and the implants to be used were identified.  One percent Xylocaine with epinephrine was used to infiltrate the area.   The old mastectomy scar was opened inferiorly and the dissection was taken to the inframammary fold on the inside superficial to the latissimus muscle. The latissimus was then split to expose the expander which was then removed. Inspection of the pocket showed a normal healthy capsule.   Circumferential capsulotomies were performed to allow for breast pocket expansion.  Measurements were made to confirm adequate pocket size for the implant dimensions.  Hemostasis was ensured.  Gloves were changed. The sizer was used for size confirmation. The implant was placed in the pocket and oriented appropriately. The latissimus muscle and capsule on the anterior surface were re-closed with a 3-0 running Vicryl suture. The remaining skin was closed with 4-0 Monocryl deep dermal and 5-0 Monocryl subcuticular stitches.  Tumescent was placed laterally.  Liposuction was done of the lateral right breast for better contour and then the lower medial breast.  An eliptical incision was made around the abdominal skin lesion with a #15 blade.  The skin was closed with a running subcuticular 5-0 monocryl.    Attention was turned to the left breast.  Preoperative markings were confirmed.  Incision lines were injected with 1% Xylocaine with epinephrine.  After waiting for vasoconstriction, the marked lines were incised.  A Wise-pattern superomedial breast reduction was performed by de-epithelializing the pedicle, using bovie to create the superomedial pedicle, and  removing breast tissue from the superior, lateral, and inferior portions of the breast.  Care was taken to not undermine the  breast pedicle. Hemostasis was achieved.  The nipple was gently rotated into position and the skin was temporarily closed.  The patient was sat upright and size and shape symmetry was confirmed.  The pocket irrigated, a drain placed, and hemostasis confirmed.  The deep tissues were approximated with 3-0 monocryl sutures and the skin was closed with deep dermal and subcuticular 4-0 Monocryl sutures.  Tumescent was placed at the beginning and liposuction was done for symmetry. The nipple and skin flaps had good capillary refill at the end of the procedure.  The patient tolerated the procedure well. The patient was allowed to wake from anesthesia and taken to the recovery room in satisfactory condition.

## 2013-10-22 NOTE — Transfer of Care (Signed)
Immediate Anesthesia Transfer of Care Note  Patient: KIMMERLY LORA  Procedure(s) Performed: Procedure(s): REMOVAL OF RIGHT TISSUE EXPANDER WITH PLACEMENT OF RIGHT BREAST IMPLANT (Right) BREAST REDUCTION/MASTOPEXY TO LEFT BREAST WITH LIPOSUCTION/FAT GRAFTING FOR SYMMETRY (Left)  Patient Location: PACU  Anesthesia Type:General  Level of Consciousness: awake, alert  and oriented  Airway & Oxygen Therapy: Patient Spontanous Breathing and Patient connected to face mask oxygen  Post-op Assessment: Report given to PACU RN and Post -op Vital signs reviewed and stable  Post vital signs: Reviewed and stable  Complications: No apparent anesthesia complications

## 2013-10-22 NOTE — Anesthesia Postprocedure Evaluation (Signed)
Anesthesia Post Note  Patient: [name]  Procedure(s) Performed: Procedure(s) (LRB): REMOVAL OF RIGHT TISSUE EXPANDER WITH PLACEMENT OF RIGHT BREAST IMPLANT (Right) BREAST REDUCTION/MASTOPEXY TO LEFT BREAST WITH LIPOSUCTION/FAT GRAFTING FOR SYMMETRY (Left)  Anesthesia type: General  Patient location: PACU  Post pain: Pain level controlled  Post assessment: Patient's Cardiovascular Status Stable  Last Vitals:  Filed Vitals:   10/22/13 1100  BP: 134/70  Pulse: 86  Temp:   Resp: 13    Post vital signs: Reviewed and stable  Level of consciousness: alert  Complications: No apparent anesthesia complications

## 2013-10-22 NOTE — Brief Op Note (Signed)
10/22/2013  10:16 AM  PATIENT:  [name]  63 y.o. female  PRE-OPERATIVE DIAGNOSIS:  ABSENCE OF RIGHT BREAST/HISTORY OF BREAST CANCER  POST-OPERATIVE DIAGNOSIS:  ABSENCE OF RIGHT BREAST/HISTORY OF BREAST  PROCEDURE:  Procedure(s): REMOVAL OF RIGHT TISSUE EXPANDER WITH PLACEMENT OF RIGHT BREAST IMPLANT (Right) BREAST REDUCTION/MASTOPEXY TO LEFT BREAST WITH LIPOSUCTION/FAT GRAFTING FOR SYMMETRY (Left)  SURGEON:  Surgeon(s) and Role:    * Fiza Nation Sanger, DO - Primary  PHYSICIAN ASSISTANT: Shawn Rayburn, PA  ASSISTANTS: none   ANESTHESIA:   local and general  EBL:  Total I/O In: 1400 [I.V.:1400] Out: -   BLOOD ADMINISTERED:none  DRAINS: none   LOCAL MEDICATIONS USED:  LIDOCAINE   SPECIMEN:  Source of Specimen:  left breast tissue  DISPOSITION OF SPECIMEN:  PATHOLOGY  COUNTS:  YES  TOURNIQUET:  * No tourniquets in log *  DICTATION: .Dragon Dictation  PLAN OF CARE: Discharge to home after PACU  PATIENT DISPOSITION:  PACU - hemodynamically stable.   Delay start of Pharmacological VTE agent (>24hrs) due to surgical blood loss or risk of bleeding: no

## 2013-10-22 NOTE — Anesthesia Preprocedure Evaluation (Signed)
Anesthesia Evaluation  Patient identified by MRN, date of birth, ID band Patient awake    Reviewed: Allergy & Precautions, H&P , NPO status , Patient's Chart, lab work & pertinent test results, reviewed documented beta blocker date and time   History of Anesthesia Complications (+) PONV and history of anesthetic complications  Airway Mallampati: II TM Distance: >3 FB Neck ROM: full    Dental   Pulmonary former smoker,  breath sounds clear to auscultation        Cardiovascular negative cardio ROS  Rhythm:regular     Neuro/Psych  Headaches, negative psych ROS   GI/Hepatic Neg liver ROS, GERD-  Medicated and Controlled,  Endo/Other  Hyperthyroidism   Renal/GU negative Renal ROS  negative genitourinary   Musculoskeletal   Abdominal   Peds  Hematology negative hematology ROS (+)   Anesthesia Other Findings See surgeon's H&P   Reproductive/Obstetrics negative OB ROS                           Anesthesia Physical Anesthesia Plan  ASA: II  Anesthesia Plan: General   Post-op Pain Management:    Induction: Intravenous  Airway Management Planned: Oral ETT  Additional Equipment:   Intra-op Plan:   Post-operative Plan: Extubation in OR  Informed Consent: I have reviewed the patients History and Physical, chart, labs and discussed the procedure including the risks, benefits and alternatives for the proposed anesthesia with the patient or authorized representative who has indicated his/her understanding and acceptance.   Dental Advisory Given  Plan Discussed with: CRNA and Surgeon  Anesthesia Plan Comments:         Anesthesia Quick Evaluation

## 2013-10-22 NOTE — H&P (Signed)
[name] is an 63 y.o. female.   Chief Complaint: acquired absence of right breast HPI: The patient is a 63 yrs old female here for a history and physical for secondary breast reconstruction.  She underwent a right breast latissimus myocutaneous flap and expander placement. She was sent by Dr. Brantley Stage. She had recurrent right breast DCIS diagnosed on mammography and by core biopsy in 11/14. One year ago, she was diagnosed with right breast DCIS and underwent breast conservation surgery, radiation therapy and was placed on tamoxifen. Suspicious microcalcifications were noted on her mammogram this year and core biopsy showed DCIS in her lumpectomy bed. She has a history of anemia, arthritis, thyroid disease, breast cancer and right lung cancer. The patient underwent a partial lung resection, appendectomy, nasal surgery, oral surgery and partial mastectomy. Both were ER/PR positive. There is a strong family history of breast cancer. She was a smoker (quit 2006). She is doing very well and is pleased with her progress. She has Right total of 460/350 cc  Past Medical History  Diagnosis Date  . Radiation 07/03/12-07/09/12    Right upper lung 5400 cGy  . Anxiety   . Osteoarthritis   . DDD (degenerative disc disease)     neck  . Thyroid nodule, toxic or with hyperthyroidism     no current med.  Marland Kitchen History of breast cancer   . PONV (postoperative nausea and vomiting)   . 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 Sinus headache     occasional  . GERD (gastroesophageal reflux disease)     occasional - no current med.  . Eczema   . Urinary, incontinence, stress female   . History of lung cancer     non-small cell - left upper lobe    Past Surgical History  Procedure Laterality Date  . Video assisted thoracoscopy (vats)/ lobectomy Left 07/14/2004    upper tri-segmentectomy  . Wisdom tooth extraction      "3"  . Trigger finger release Bilateral      "I've had OR on q finger" (05/21/2013)  . Nasal sinus surgery  ?[id number]    "at least 3" (05/21/2013)  . Appendectomy  1971  . Partial mastectomy with needle localization  04/10/2012    Procedure: PARTIAL MASTECTOMY WITH NEEDLE LOCALIZATION;  Surgeon: Marcello Moores A. Cornett, MD;  Location: [facility];  Service: General;  Laterality: Right;  . Mouth surgery Right may 2014    "keratocystic odotogenic tumor" - X 2  . Mastectomy w/ sentinel node biopsy Right 05/20/2013    Procedure: RIGHT SIMPLEMASTECTOMY AND SENTINEL NODE   Sampling;  Surgeon: Joyice Faster. Cornett, MD;  Location: [facility] [facility];  Service: General;  Laterality: Right;  . Breast reconstruction with placement of tissue expander and flex hd (acellular hydrated dermis) Right 05/20/2013    Procedure: IMMEDIATE RIGHT BREAST RECONSTRUCTION WITH LATISSAMUS MUSCLE FLAP AND PLACEMENT OF TISSUE EXPANDER TO RIGHT BREAST;  Surgeon: Theodoro Kos, DO;  Location: [facility] [facility];  Service: Plastics;  Laterality: Right;  . Thoracotomy Left 07/14/2004  . Trigger finger release Right 07/14/1999    index and little fingers  . Trigger finger release Right 07/17/2002    long and ring fingers  . De quervain's release Left 07/31/2002  . Trigger finger release Left 07/31/2002    little, ring and index fingers    Family History  Problem Relation Age of Onset  . Breast cancer Sister   .  Cancer Sister   . Breast cancer Maternal Aunt   . Cancer Maternal Aunt   . Anesthesia problems Daughter     post-op N/V  . Leukemia Father   . Cancer Father   . Anesthesia problems Mother     post-op N/V   Social History:  reports that she quit smoking about 9 years ago. She has never used smokeless tobacco. She reports that she drinks alcohol. She reports that she does not use illicit drugs.  Allergies:  Allergies  Allergen Reactions  . Amoxicillin Hives  . Asa [Aspirin] Other (See Comments)    GI UPSET  . Banana Other (See Comments)    GI UPSET; POSITIVE ON ALLERGY TEST   . Ceftin [Cefuroxime Axetil] Diarrhea  . Lactose Intolerance (Gi) Other (See Comments)    GI UPSET  . Orange Fruit [Citrus] Other (See Comments)    POSITIVE ON ALLERGY TEST; GI UPSET  . Penicillins Hives  . Adhesive [Tape] Other (See Comments)    SKIN REDNESS    Medications Prior to Admission  Medication Sig Dispense Refill  . acetaminophen (TYLENOL) 500 MG tablet Take 1,000 mg by mouth every 6 (six) hours as needed (arthritis).      . cycloSPORINE (RESTASIS) 0.05 % ophthalmic emulsion 1 drop 2 (two) times daily.      . fluticasone (FLONASE) 50 MCG/ACT nasal spray Place 2 sprays into both nostrils daily.       Marland Kitchen guaifenesin (HUMIBID E) 400 MG TABS Take 400 mg by mouth as needed (cold).       . Multiple Vitamin (MULTIVITAMIN) capsule Take 1 capsule by mouth.      Marland Kitchen PATANOL 0.1 % ophthalmic solution Place 1 drop into both eyes as needed for allergies.       Vladimir Faster Glycol-Propyl Glycol (SYSTANE OP) Apply 1 drop to eye as needed (dry eyes).         No results found for this or any previous visit (from the past 48 hour(s)). No results found.  Review of Systems  Constitutional: Negative.   HENT: Negative.   Eyes: Negative.   Respiratory: Negative.   Cardiovascular: Negative.   Gastrointestinal: Negative.   Genitourinary: Negative.   Musculoskeletal: Negative.   Skin: Negative.   Neurological: Negative.   Psychiatric/Behavioral: Negative.     Blood pressure 132/80, pulse 90, temperature 98.1 F (36.7 C), temperature source Oral, resp. rate 18, height 5\' 4"  (1.626 m), weight 70.421 kg (155 lb 4 oz), SpO2 100.00%. Physical Exam  Constitutional: She is oriented to person, place, and time. She appears well-developed and well-nourished.  HENT:  Head: Normocephalic and atraumatic.  Eyes: Conjunctivae and EOM are normal. Pupils are equal, round, and reactive to light.  Neck: Normal range of motion.  Cardiovascular: Normal rate.   Respiratory: Effort normal.  GI: Soft.   Musculoskeletal: Normal range of motion.  Neurological: She is alert and oriented to person, place, and time.  Skin: Skin is warm.  Psychiatric: She has a normal mood and affect. Her behavior is normal. Judgment and thought content normal.     Assessment/Plan Plan for right breast expander removal and implant placement and left breast mastopexy/reduction with possible lipofilling.  Risks and complications were reviewed and included bleeding, pain, scar and risk of anethesia.  SANGER,[name] 10/22/2013, 7:33 AM

## 2013-10-22 NOTE — Discharge Instructions (Signed)
May shower tomorrow Continue to wear the breast binder or sports bra.   Post Anesthesia Home Care Instructions  Activity: Get plenty of rest for the remainder of the day. A responsible adult should stay with you for 24 hours following the procedure.  For the next 24 hours, DO NOT: -Drive a car -Paediatric nurse -Drink alcoholic beverages -Take any medication unless instructed by your physician -Make any legal decisions or sign important papers.  Meals: Start with liquid foods such as gelatin or soup. Progress to regular foods as tolerated. Avoid greasy, spicy, heavy foods. If nausea and/or vomiting occur, drink only clear liquids until the nausea and/or vomiting subsides. Call your physician if vomiting continues.  Special Instructions/Symptoms: Your throat may feel dry or sore from the anesthesia or the breathing tube placed in your throat during surgery. If this causes discomfort, gargle with warm salt water. The discomfort should disappear within 24 hours.

## 2013-10-23 ENCOUNTER — Encounter (HOSPITAL_BASED_OUTPATIENT_CLINIC_OR_DEPARTMENT_OTHER): Payer: Self-pay | Admitting: Plastic Surgery

## 2013-12-24 ENCOUNTER — Telehealth: Payer: Self-pay | Admitting: Internal Medicine

## 2013-12-24 NOTE — Telephone Encounter (Signed)
returned pt call and sched pt for appt...done...pt ok and aware

## 2014-01-08 ENCOUNTER — Other Ambulatory Visit: Payer: Self-pay

## 2014-01-20 ENCOUNTER — Encounter: Payer: Self-pay | Admitting: Internal Medicine

## 2014-01-20 ENCOUNTER — Ambulatory Visit (HOSPITAL_BASED_OUTPATIENT_CLINIC_OR_DEPARTMENT_OTHER): Payer: 59

## 2014-01-20 ENCOUNTER — Ambulatory Visit (HOSPITAL_BASED_OUTPATIENT_CLINIC_OR_DEPARTMENT_OTHER): Payer: 59 | Admitting: Internal Medicine

## 2014-01-20 ENCOUNTER — Telehealth: Payer: Self-pay | Admitting: Internal Medicine

## 2014-01-20 VITALS — BP 129/66 | HR 65 | Temp 98.4°F | Resp 18 | Ht 64.0 in | Wt 161.9 lb

## 2014-01-20 DIAGNOSIS — D0511 Intraductal carcinoma in situ of right breast: Secondary | ICD-10-CM

## 2014-01-20 DIAGNOSIS — Z85118 Personal history of other malignant neoplasm of bronchus and lung: Secondary | ICD-10-CM

## 2014-01-20 LAB — COMPREHENSIVE METABOLIC PANEL (CC13)
ALT: 25 U/L (ref 0–55)
AST: 18 U/L (ref 5–34)
Albumin: 3.6 g/dL (ref 3.5–5.0)
Alkaline Phosphatase: 93 U/L (ref 40–150)
Anion Gap: 7 mEq/L (ref 3–11)
BUN: 14.6 mg/dL (ref 7.0–26.0)
CALCIUM: 9.3 mg/dL (ref 8.4–10.4)
CHLORIDE: 111 meq/L — AB (ref 98–109)
CO2: 26 mEq/L (ref 22–29)
Creatinine: 0.8 mg/dL (ref 0.6–1.1)
Glucose: 101 mg/dl (ref 70–140)
Potassium: 4.3 mEq/L (ref 3.5–5.1)
SODIUM: 144 meq/L (ref 136–145)
Total Bilirubin: 0.31 mg/dL (ref 0.20–1.20)
Total Protein: 6.7 g/dL (ref 6.4–8.3)

## 2014-01-20 LAB — CBC WITH DIFFERENTIAL/PLATELET
BASO%: 0.4 % (ref 0.0–2.0)
Basophils Absolute: 0 10*3/uL (ref 0.0–0.1)
EOS%: 9.1 % — ABNORMAL HIGH (ref 0.0–7.0)
Eosinophils Absolute: 0.4 10*3/uL (ref 0.0–0.5)
HEMATOCRIT: 41.9 % (ref 34.8–46.6)
HGB: 13.6 g/dL (ref 11.6–15.9)
LYMPH#: 1.4 10*3/uL (ref 0.9–3.3)
LYMPH%: 31.3 % (ref 14.0–49.7)
MCH: 29.6 pg (ref 25.1–34.0)
MCHC: 32.5 g/dL (ref 31.5–36.0)
MCV: 91.3 fL (ref 79.5–101.0)
MONO#: 0.4 10*3/uL (ref 0.1–0.9)
MONO%: 8.6 % (ref 0.0–14.0)
NEUT%: 50.6 % (ref 38.4–76.8)
NEUTROS ABS: 2.3 10*3/uL (ref 1.5–6.5)
PLATELETS: 325 10*3/uL (ref 145–400)
RBC: 4.59 10*6/uL (ref 3.70–5.45)
RDW: 13.8 % (ref 11.2–14.5)
WBC: 4.5 10*3/uL (ref 3.9–10.3)

## 2014-01-20 MED ORDER — TAMOXIFEN CITRATE 20 MG PO TABS: 20.0000 mg | ORAL_TABLET | Freq: Every day | ORAL | Status: DC

## 2014-01-20 MED ORDER — VENLAFAXINE HCL ER 75 MG PO CP24: 75.0000 mg | ORAL_CAPSULE | Freq: Every day | ORAL | Status: DC

## 2014-01-20 NOTE — Progress Notes (Signed)
[facility] [phone]   Fax:([phone]  OFFICE PROGRESS NOTE  [name], MD [place] [id number]  DIAGNOSIS:  1) stage IIA non-small cell lung cancer diagnosed in March of 2006 2) recurrent ductal carcinoma in situ of the right breast initially diagnosed in January of 2014 with recurrence in October of 2014.  PRIOR THERAPY: 1) status post left trisegmentectomy with node dissection on 06/24/2004 2) status post 4 cycles of adjuvant chemotherapy with carboplatin and paclitaxel last dose was given 10/24/2004. 3) status post right partial mastectomy on 04/10/2012 under the care of Dr. Brantley Stage. 4) status post adjuvant radiotherapy to the right breast under the care of Dr. Valere Dross  CURRENT THERAPY: Tamoxifen 20 mg by mouth daily started in May of 2014.  INTERVAL HISTORY: [name] 63 y.o. female returns to the clinic today for followup visit. The patient is feeling fine today with no specific complaints. She recently underwent a right breast reconstruction surgery under the care of Dr. Migdalia Dk. She is recovering from her surgery fairly well with no significant complications. She has been off treatment with tamoxifen for several months now. She is here today for evaluation and discussion of her treatment options. She denied having any uterine bleeding. She has no chest pain, shortness of breath, cough or hemoptysis. She denied having any significant nausea or vomiting, no significant weight loss or night sweats.  MEDICAL HISTORY: Past Medical History  Diagnosis Date  . Radiation 07/03/12-07/09/12    Right upper lung 5400 cGy  . Anxiety   . Osteoarthritis   . DDD (degenerative disc disease)     neck  . Thyroid nodule, toxic or with hyperthyroidism     no current med.  Marland Kitchen History of breast cancer   . PONV (postoperative nausea and vomiting)   . 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 Sinus headache     occasional  . GERD (gastroesophageal reflux disease)     occasional - no current med.  . Eczema   . Urinary, incontinence, stress female   . History of lung cancer     non-small cell - left upper lobe    ALLERGIES:  is allergic to amoxicillin; asa; banana; ceftin; lactose intolerance (gi); orange fruit; penicillins; and adhesive.  MEDICATIONS:  Current Outpatient Prescriptions  Medication Sig Dispense Refill  . acetaminophen (TYLENOL) 500 MG tablet Take 1,000 mg by mouth every 6 (six) hours as needed (arthritis).      . cycloSPORINE (RESTASIS) 0.05 % ophthalmic emulsion 1 drop 2 (two) times daily.      . fluticasone (FLONASE) 50 MCG/ACT nasal spray Place 2 sprays into both nostrils daily.       . Multiple Vitamin (MULTIVITAMIN) capsule Take 1 capsule by mouth.      Marland Kitchen PATANOL 0.1 % ophthalmic solution Place 1 drop into both eyes as needed for allergies.       Vladimir Faster Glycol-Propyl Glycol (SYSTANE OP) Apply 1 drop to eye as needed (dry eyes).       . vitamin E 400 UNIT capsule Take 400 Units by mouth.      . guaifenesin (HUMIBID E) 400 MG TABS Take 400 mg by mouth as needed (cold).        No current facility-administered medications for this visit.    SURGICAL HISTORY:  Past Surgical History  Procedure  Laterality Date  . Video assisted thoracoscopy (vats)/ lobectomy Left 07/14/2004    upper tri-segmentectomy  . Wisdom tooth extraction      "3"  . Trigger finger release Bilateral     "I've had OR on q finger" (05/21/2013)  . Nasal sinus surgery  ?[id number]    "at least 3" (05/21/2013)  . Appendectomy  1971  . Partial mastectomy with needle localization  04/10/2012    Procedure: PARTIAL MASTECTOMY WITH NEEDLE LOCALIZATION;  Surgeon: Marcello Moores A. Cornett, MD;  Location: [facility];  Service: General;  Laterality: Right;  . Mouth surgery Right may 2014    "keratocystic odotogenic tumor" - X 2  . Mastectomy w/ sentinel node biopsy  Right 05/20/2013    Procedure: RIGHT SIMPLEMASTECTOMY AND SENTINEL NODE   Sampling;  Surgeon: Joyice Faster. Cornett, MD;  Location: [place];  Service: General;  Laterality: Right;  . Breast reconstruction with placement of tissue expander and flex hd (acellular hydrated dermis) Right 05/20/2013    Procedure: IMMEDIATE RIGHT BREAST RECONSTRUCTION WITH LATISSAMUS MUSCLE FLAP AND PLACEMENT OF TISSUE EXPANDER TO RIGHT BREAST;  Surgeon: Theodoro Kos, DO;  Location: [facility];  Service: Plastics;  Laterality: Right;  . Thoracotomy Left 07/14/2004  . Trigger finger release Right 07/14/1999    index and little fingers  . Trigger finger release Right 07/17/2002    long and ring fingers  . De quervain's release Left 07/31/2002  . Trigger finger release Left 07/31/2002    little, ring and index fingers  . Removal of tissue expander and placement of implant Right 10/22/2013    Procedure: REMOVAL OF RIGHT TISSUE EXPANDER WITH PLACEMENT OF RIGHT BREAST IMPLANT;  Surgeon: Theodoro Kos, DO;  Location: [facility];  Service: Plastics;  Laterality: Right;  . Breast reduction with mastopexy Left 10/22/2013    Procedure: BREAST REDUCTION/MASTOPEXY TO LEFT BREAST WITH LIPOSUCTION/FAT GRAFTING FOR SYMMETRY;  Surgeon: Theodoro Kos, DO;  Location: [facility];  Service: Plastics;  Laterality: Left;    REVIEW OF SYSTEMS:  Constitutional: negative Eyes: negative Ears, nose, mouth, throat, and face: negative Respiratory: negative Cardiovascular: negative Gastrointestinal: negative Genitourinary:negative Integument/breast: negative Hematologic/lymphatic: negative Musculoskeletal:negative Neurological: negative Behavioral/Psych: negative Endocrine: negative Allergic/Immunologic: negative   PHYSICAL EXAMINATION: General appearance: alert, cooperative and no distress Head: Normocephalic, without obvious abnormality, atraumatic Neck: no adenopathy Lymph nodes: Cervical, supraclavicular, and axillary  nodes normal. Resp: clear to auscultation bilaterally Cardio: regular rate and rhythm, S1, S2 normal, no murmur, click, rub or gallop GI: soft, non-tender; bowel sounds normal; no masses,  no organomegaly Extremities: extremities normal, atraumatic, no cyanosis or edema Neurologic: Alert and oriented X 3, normal strength and tone. Normal symmetric reflexes. Normal coordination and gait  ECOG PERFORMANCE STATUS: 0 - Asymptomatic  Blood pressure 129/66, pulse 65, temperature 98.4 F (36.9 C), temperature source Oral, resp. rate 18, height 5\' 4"  (1.626 m), weight 161 lb 14.4 oz (73.437 kg).  LABORATORY DATA: Lab Results  Component Value Date   WBC 10.2 05/20/2013   HGB 14.6 10/22/2013   HCT 36.5 05/20/2013   MCV 91.7 05/20/2013   PLT 295 05/20/2013      Chemistry      Component Value Date/Time   NA 139 05/20/2013 1830   NA 143 02/25/2013 0754   K 5.1 05/20/2013 1830   K 4.1 02/25/2013 0754   CL 102 05/20/2013 1830   CL 108* 04/28/2012 1008   CO2 25 05/20/2013 1830   CO2 25 02/25/2013 0754   BUN  12 05/20/2013 1830   BUN 16.6 02/25/2013 0754   CREATININE 0.69 05/20/2013 1830   CREATININE 0.8 02/25/2013 0754      Component Value Date/Time   CALCIUM 8.6 05/20/2013 1830   CALCIUM 9.2 02/25/2013 0754   ALKPHOS 47 02/25/2013 0754   ALKPHOS 83 04/08/2012 0900   AST 16 02/25/2013 0754   AST 21 04/08/2012 0900   ALT 13 02/25/2013 0754   ALT 16 04/08/2012 0900   BILITOT 0.37 02/25/2013 0754   BILITOT 0.3 04/08/2012 0900       RADIOGRAPHIC STUDIES:  ASSESSMENT AND PLAN: This is a very pleasant 63 years old white female with history of stage IIA non-small cell lung cancer as well as recurrent right breast ductal carcinoma in situ. She status post right lumpectomy followed by adjuvant radiotherapy. I had a lengthy discussion with the patient today about her current condition and treatment options.  I discussed with the patient resuming her treatment with tamoxifen again as the standard of care treatment  for DCIS. She was worried about hot flashes and I recommended for her treatment with Effexor XR 75 mg by mouth each bedtime. The patient agreed to proceed with the treatment as planned. She would come back for followup visit in 3 months for reevaluation. She was advised to call immediately if she has any concerning symptoms in the interval. All questions were answered. The patient knows to call the clinic with any problems, questions or concerns. We can certainly see the patient much sooner if necessary. Disclaimer: This note was dictated with voice recognition software. Similar sounding words can inadvertently be transcribed and may be missed upon review.

## 2014-01-20 NOTE — Telephone Encounter (Signed)
Gave avs & cal for Jan 2016. Also gave pw for Walk in Xray.

## 2014-01-21 ENCOUNTER — Encounter: Payer: Self-pay | Admitting: Internal Medicine

## 2014-01-21 LAB — CANCER ANTIGEN 27.29: CA 27.29: 34 U/mL (ref 0–39)

## 2014-01-25 ENCOUNTER — Encounter: Payer: Self-pay | Admitting: Internal Medicine

## 2014-01-30 ENCOUNTER — Other Ambulatory Visit: Payer: Self-pay | Admitting: Internal Medicine

## 2014-01-30 ENCOUNTER — Encounter: Payer: Self-pay | Admitting: Internal Medicine

## 2014-04-13 ENCOUNTER — Ambulatory Visit (HOSPITAL_COMMUNITY)
Admission: RE | Admit: 2014-04-13 | Discharge: 2014-04-13 | Disposition: A | Discharge status: Home / Self Care | Payer: 59 | Source: Ambulatory Visit | Attending: Internal Medicine | Admitting: Internal Medicine

## 2014-04-13 ENCOUNTER — Telehealth: Payer: Self-pay | Admitting: *Deleted

## 2014-04-13 ENCOUNTER — Other Ambulatory Visit: Payer: Self-pay | Admitting: Internal Medicine

## 2014-04-13 ENCOUNTER — Other Ambulatory Visit (HOSPITAL_BASED_OUTPATIENT_CLINIC_OR_DEPARTMENT_OTHER): Payer: 59

## 2014-04-13 DIAGNOSIS — D0511 Intraductal carcinoma in situ of right breast: Secondary | ICD-10-CM | POA: Diagnosis present

## 2014-04-13 DIAGNOSIS — Z85118 Personal history of other malignant neoplasm of bronchus and lung: Secondary | ICD-10-CM | POA: Insufficient documentation

## 2014-04-13 LAB — CBC WITH DIFFERENTIAL/PLATELET
BASO%: 0.7 % (ref 0.0–2.0)
Basophils Absolute: 0 10*3/uL (ref 0.0–0.1)
EOS ABS: 0.4 10*3/uL (ref 0.0–0.5)
EOS%: 9.5 % — ABNORMAL HIGH (ref 0.0–7.0)
HCT: 38.1 % (ref 34.8–46.6)
HGB: 12.3 g/dL (ref 11.6–15.9)
LYMPH%: 30.9 % (ref 14.0–49.7)
MCH: 29.9 pg (ref 25.1–34.0)
MCHC: 32.3 g/dL (ref 31.5–36.0)
MCV: 92.5 fL (ref 79.5–101.0)
MONO#: 0.4 10*3/uL (ref 0.1–0.9)
MONO%: 10 % (ref 0.0–14.0)
NEUT%: 48.9 % (ref 38.4–76.8)
NEUTROS ABS: 2.1 10*3/uL (ref 1.5–6.5)
Platelets: 336 10*3/uL (ref 145–400)
RBC: 4.12 10*6/uL (ref 3.70–5.45)
RDW: 14.3 % (ref 11.2–14.5)
WBC: 4.2 10*3/uL (ref 3.9–10.3)
lymph#: 1.3 10*3/uL (ref 0.9–3.3)

## 2014-04-13 LAB — COMPREHENSIVE METABOLIC PANEL (CC13)
ALT: 12 U/L (ref 0–55)
ANION GAP: 7 meq/L (ref 3–11)
AST: 14 U/L (ref 5–34)
Albumin: 3.4 g/dL — ABNORMAL LOW (ref 3.5–5.0)
Alkaline Phosphatase: 58 U/L (ref 40–150)
BUN: 14.8 mg/dL (ref 7.0–26.0)
CALCIUM: 8.9 mg/dL (ref 8.4–10.4)
CHLORIDE: 108 meq/L (ref 98–109)
CO2: 25 mEq/L (ref 22–29)
Creatinine: 0.8 mg/dL (ref 0.6–1.1)
EGFR: 79 mL/min/{1.73_m2} — ABNORMAL LOW (ref >90)
Glucose: 96 mg/dl (ref 70–140)
Potassium: 4.2 mEq/L (ref 3.5–5.1)
Sodium: 141 mEq/L (ref 136–145)
Total Bilirubin: 0.23 mg/dL (ref 0.20–1.20)
Total Protein: 6.5 g/dL (ref 6.4–8.3)

## 2014-04-13 LAB — CANCER ANTIGEN 27.29: CA 27.29: 28 U/mL (ref 0–39)

## 2014-04-13 IMAGING — CR DG CHEST 2V
2 series · 2 of 2 positions shown · non-contrast
Comparison: Chest x-ray dated [DATE].

CLINICAL DATA: 63-year-old female with a history of lung cancer
treatment 10 years prior. Breast cancer diagnosis [DATE] and
[DATE].

EXAM:
CHEST - 2 VIEW

[w chest pa *]
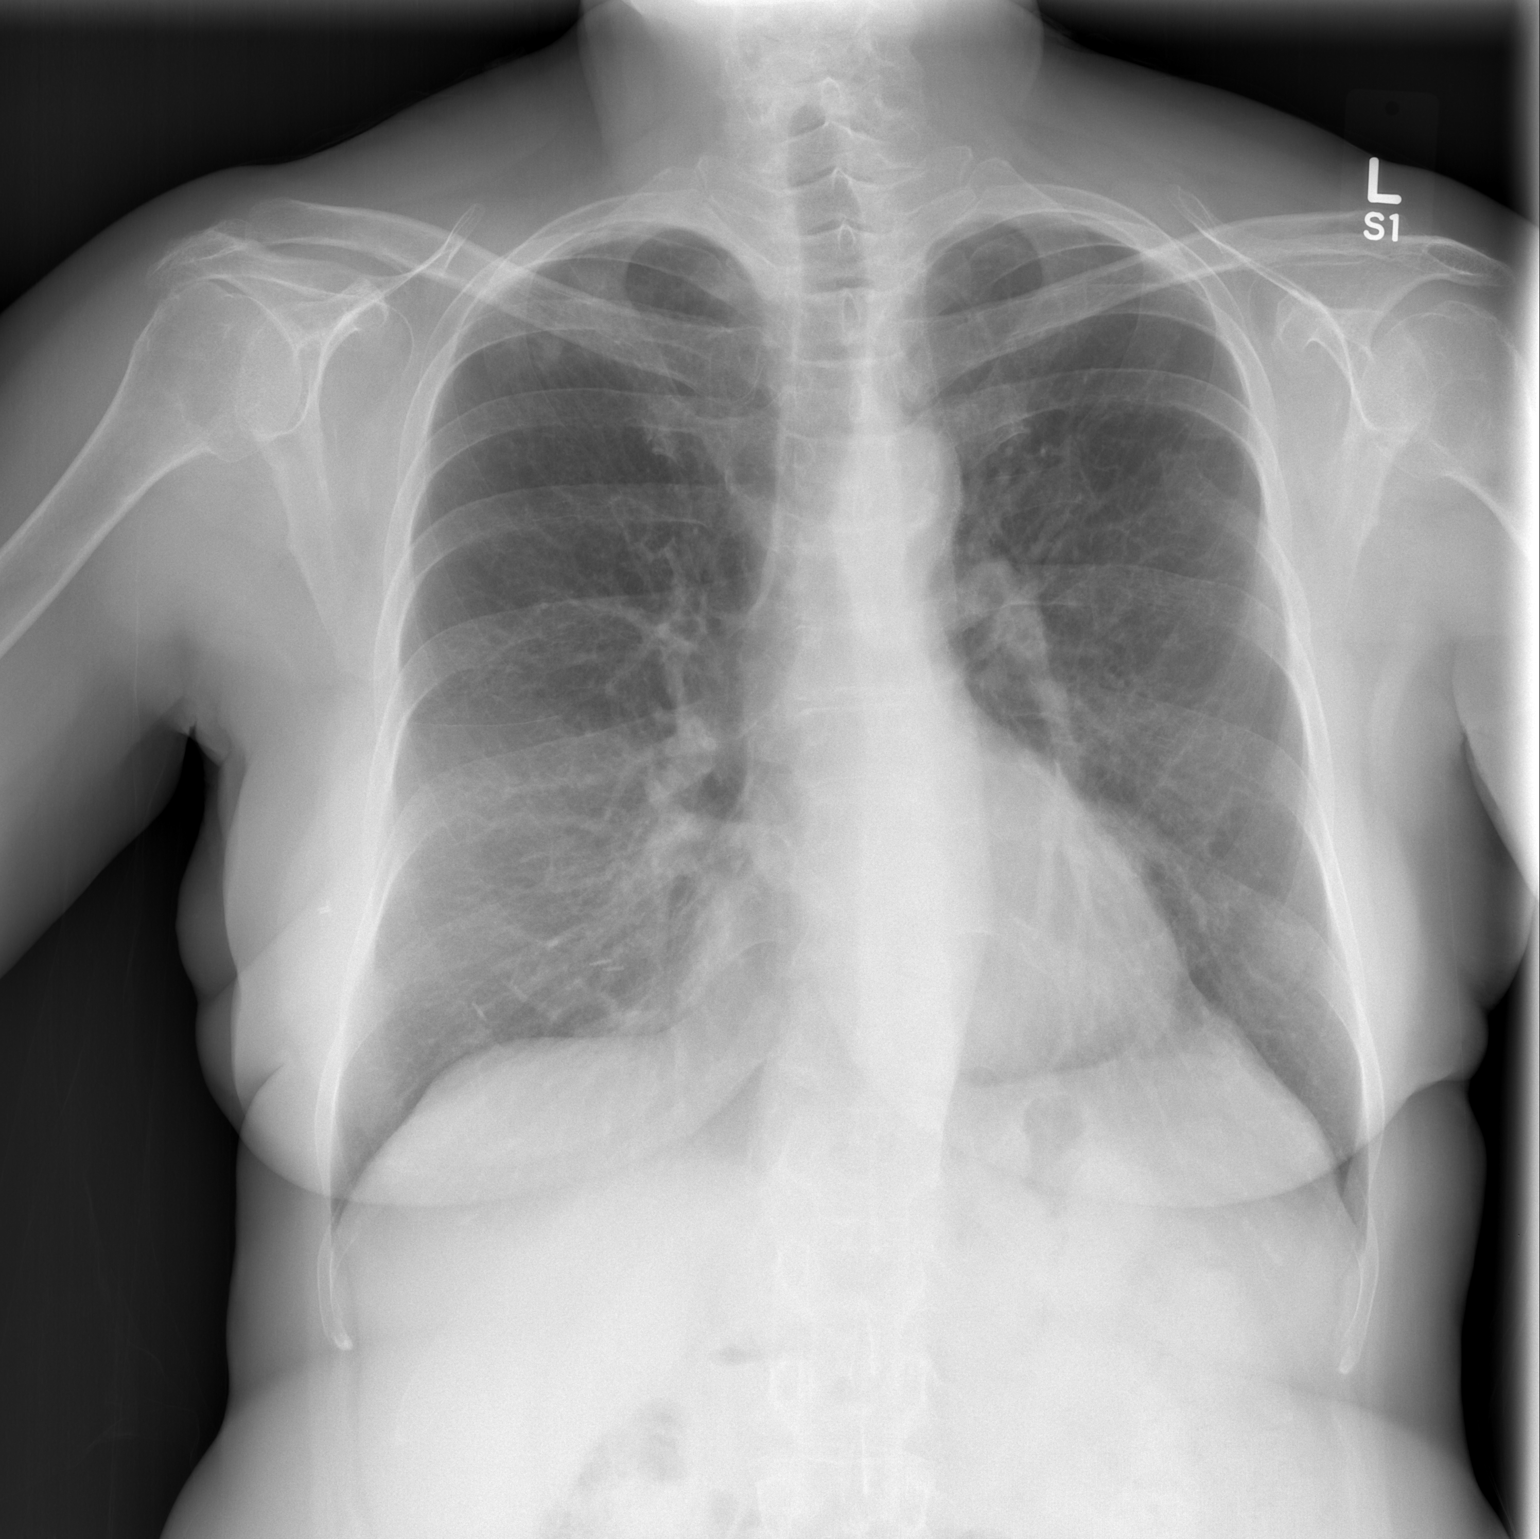

[w chest lat]
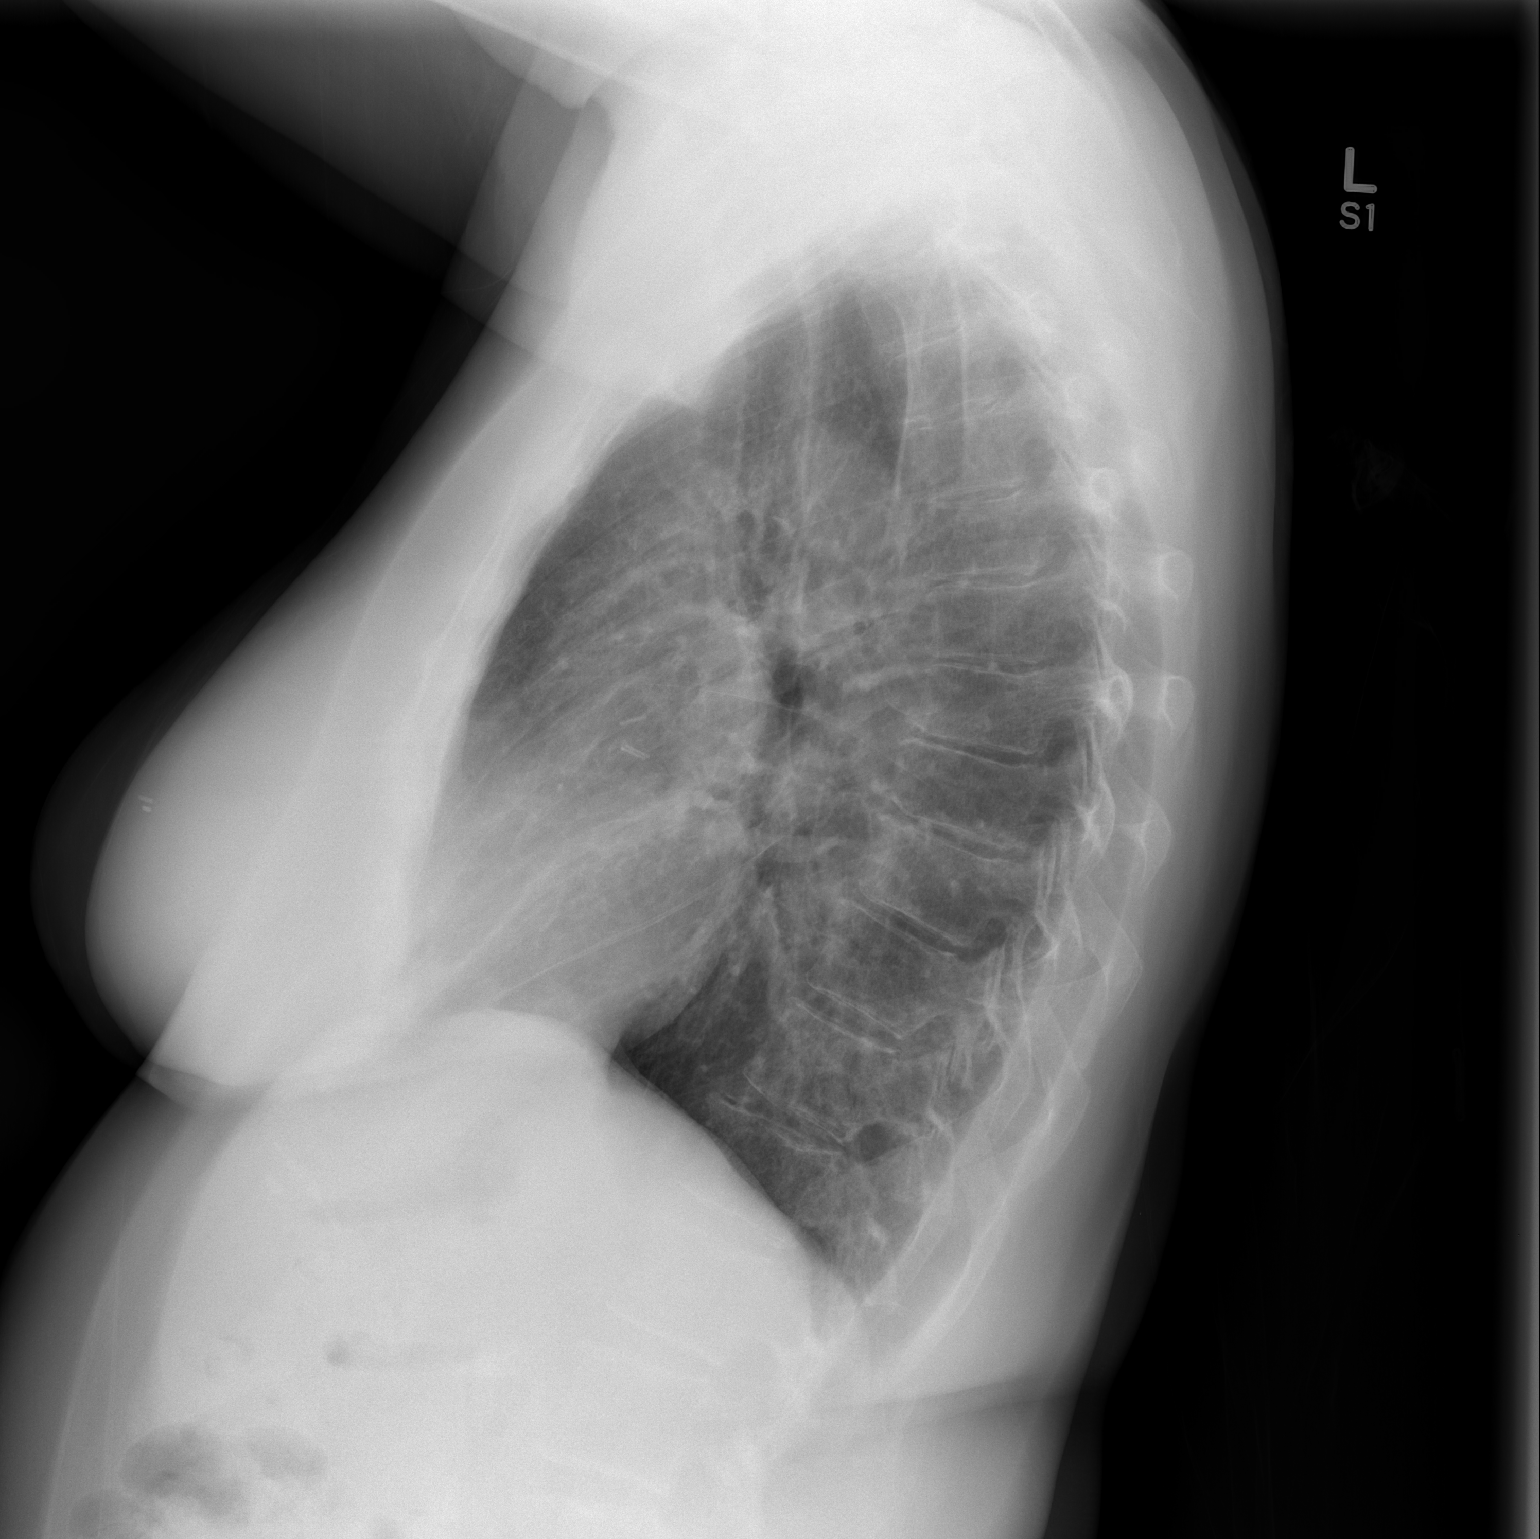

[2 of 2 positions shown; findings below may reference images not displayed]

CT dated [DATE].
There are no interval chest CTs available for review between [CS]
and the current given history of recurrent carcinoma/metastases.
FINDINGS: Cardiomediastinal silhouette is similar to the prior, with
tortuosity of the descending thoracic aorta. No cardiomegaly.

No evidence of pulmonary vascular congestion.

Hazy opacity at the right lung base, more pronounced than the
comparison plain film. Surgical clips are now present at the right
lung base, not previously present on the chest x-ray.

Nodule at the right lung apex, just inferior to the clavicle, may
have been present on the comparison plain film.

No pneumothorax or pleural effusion.

Architectural distortion at the base of the fissure on the left, at
the interface of the diaphragm.

Surgical changes of the posterior left thorax.

Degenerative changes of the bilateral glenohumeral joint.

Atherosclerotic calcifications of the aortic arch.
IMPRESSION: Hazy opacity at the right lung base in a region of interval surgical
clips may be postoperative in nature, however, this appearance can
be seen with infection, and correlation with the patient's
presentation is recommended.

Right apical nodule and architectural distortion at the bilateral
lung bases in this patient with a given history of breast cancer and
lung cancer. There are no CT studies for comparison from the
patient's prior chest CT in [CS] until currently, spanning a time
frame where there is a given history of recurrence/metastases.
Further evaluation with chest CT is recommended.

Atherosclerosis

These results will be called to the ordering clinician or
representative by the Radiologist Assistant, and communication
documented in the PACS or zVision Dashboard.

## 2014-04-13 NOTE — Addendum Note (Signed)
Addended by: Simon Rhein L on: 04/13/2014 02:58 PM   Modules accepted: Orders

## 2014-04-13 NOTE — Telephone Encounter (Signed)
Per Dr Vista Mink, due to questionable infection.  Called pt's cell phone, left a message asking for her to call back and that Dr Vista Mink was wanting to do a CT scan to look for closely.  Left call back #.

## 2014-04-13 NOTE — Telephone Encounter (Signed)
Pt called back, she is agreable to get a CXR.  Wants to know what side the questionable infection is located.  Informed pt the right side.  Dr Vista Mink got on the phone and spoke with pt.  She agrees with the plan.  Onc tx schedule filled out.

## 2014-04-13 NOTE — Telephone Encounter (Signed)
error 

## 2014-04-16 ENCOUNTER — Telehealth: Payer: Self-pay | Admitting: Medical Oncology

## 2014-04-16 NOTE — Telephone Encounter (Signed)
Asking about CT appt. I cannot get though to central scheduling. I told pt I will f/u with scheduling and pt on Monday.

## 2014-04-20 ENCOUNTER — Ambulatory Visit (HOSPITAL_COMMUNITY)
Admission: RE | Admit: 2014-04-20 | Discharge: 2014-04-20 | Disposition: A | Discharge status: Home / Self Care | Payer: 59 | Source: Ambulatory Visit | Attending: Internal Medicine | Admitting: Internal Medicine

## 2014-04-20 ENCOUNTER — Other Ambulatory Visit: Payer: 59

## 2014-04-20 ENCOUNTER — Ambulatory Visit (HOSPITAL_BASED_OUTPATIENT_CLINIC_OR_DEPARTMENT_OTHER): Payer: 59 | Admitting: Internal Medicine

## 2014-04-20 ENCOUNTER — Telehealth: Payer: Self-pay | Admitting: Internal Medicine

## 2014-04-20 ENCOUNTER — Encounter: Payer: Self-pay | Admitting: Internal Medicine

## 2014-04-20 ENCOUNTER — Encounter (HOSPITAL_COMMUNITY): Payer: Self-pay

## 2014-04-20 VITALS — BP 124/66 | HR 70 | Temp 97.7°F | Resp 18 | Ht 64.0 in | Wt 159.9 lb

## 2014-04-20 DIAGNOSIS — R918 Other nonspecific abnormal finding of lung field: Secondary | ICD-10-CM | POA: Diagnosis not present

## 2014-04-20 DIAGNOSIS — Z853 Personal history of malignant neoplasm of breast: Secondary | ICD-10-CM | POA: Insufficient documentation

## 2014-04-20 DIAGNOSIS — D0511 Intraductal carcinoma in situ of right breast: Secondary | ICD-10-CM

## 2014-04-20 DIAGNOSIS — Z923 Personal history of irradiation: Secondary | ICD-10-CM | POA: Insufficient documentation

## 2014-04-20 DIAGNOSIS — Z9221 Personal history of antineoplastic chemotherapy: Secondary | ICD-10-CM | POA: Diagnosis not present

## 2014-04-20 DIAGNOSIS — C50911 Malignant neoplasm of unspecified site of right female breast: Secondary | ICD-10-CM

## 2014-04-20 DIAGNOSIS — Z85118 Personal history of other malignant neoplasm of bronchus and lung: Secondary | ICD-10-CM | POA: Diagnosis not present

## 2014-04-20 DIAGNOSIS — C3400 Malignant neoplasm of unspecified main bronchus: Secondary | ICD-10-CM

## 2014-04-20 IMAGING — CT CT CHEST W/ CM
2 of 4 series · 15 of 36 positions shown, 18 images · IV contrast (OMNIPAQUE)
Comparison: Radiograph [DATE], CT chest [DATE]

CLINICAL DATA: Lung cancer diagnosed [R5] with left upper lobectomy
and chemotherapy. Additional history of breast cancer diagnosed [R5]
with right mastectomy radiation therapy. Pulmonary opacities on
recent chest radiograph.

EXAM:
CT CHEST WITH CONTRAST
TECHNIQUE: Multidetector CT imaging of the chest was performed during
intravenous contrast administration.
CONTRAST:  80mL OMNIPAQUE IOHEXOL 300 MG/ML  SOLN

[Series 2: chest with st · axial · 0.71mm/px · z∈[+1079,+1324]mm · 12 of 59 slices shown, 15 images]
[im 5/59  mediastinal]
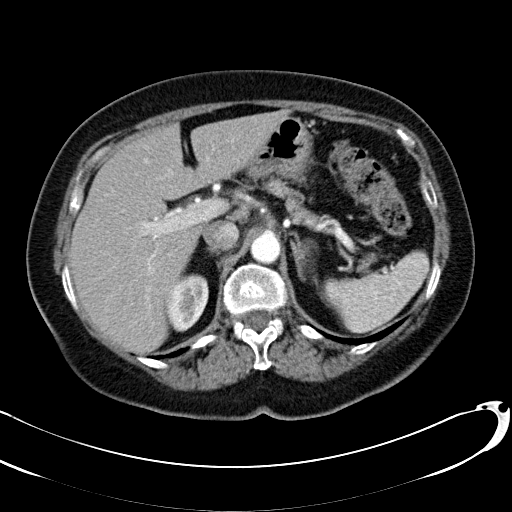
[im 5/59  lung]
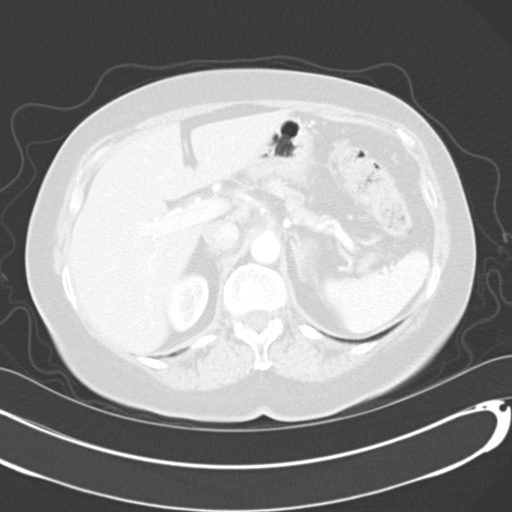
[im 9/59  lung]
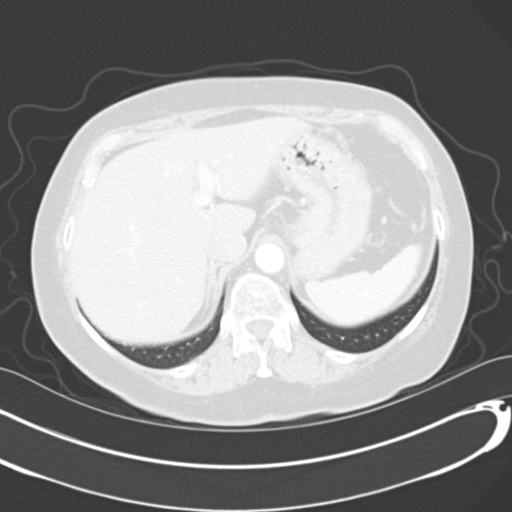
[im 14/59  lung]
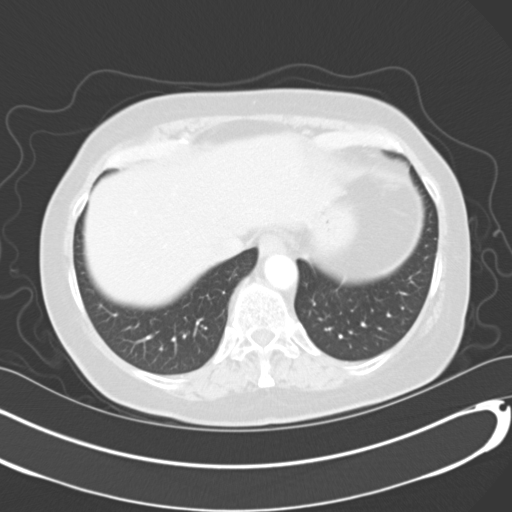
[im 18/59  lung]
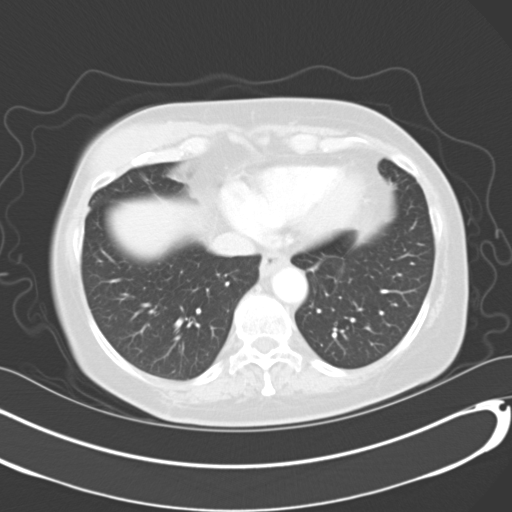
[im 23/59  mediastinal]
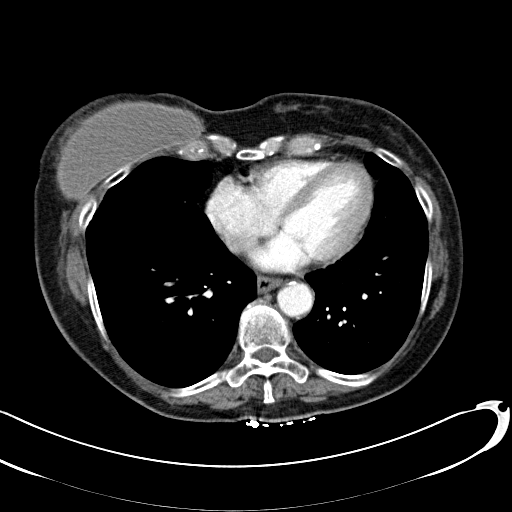
[im 23/59  lung]
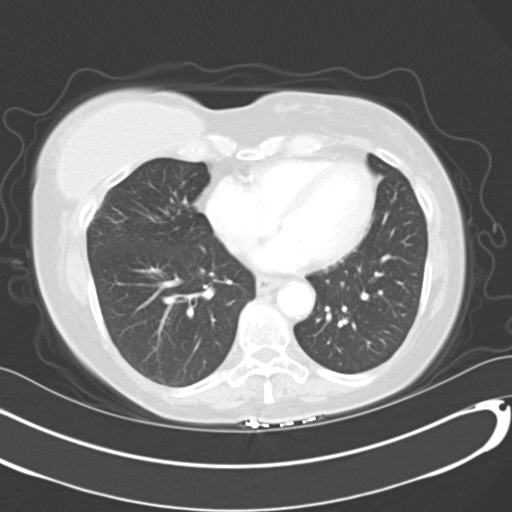
[im 27/59  lung]
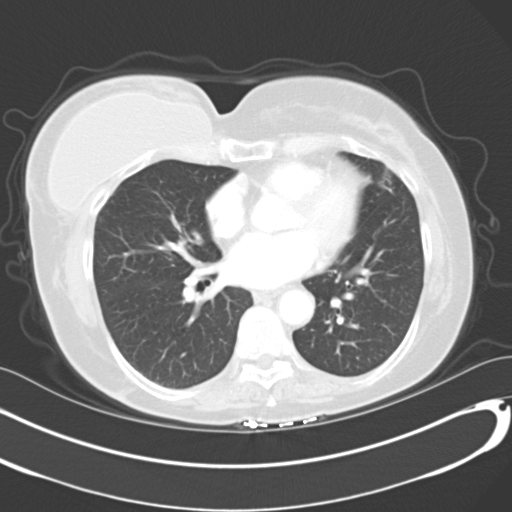
[im 32/59  lung]
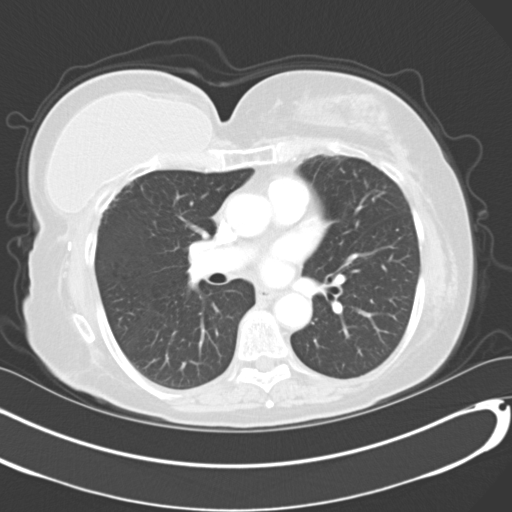
[im 36/59  lung]
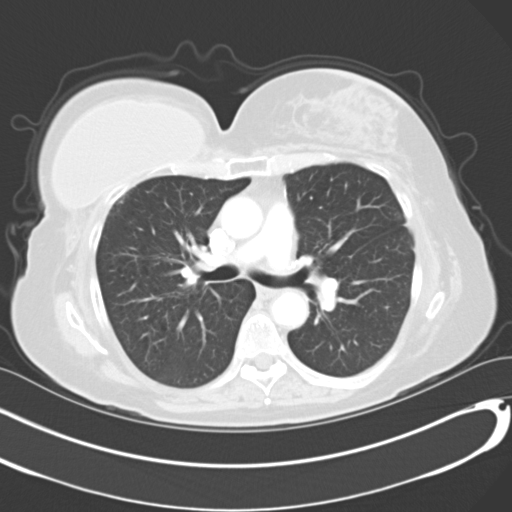
[im 41/59  mediastinal]
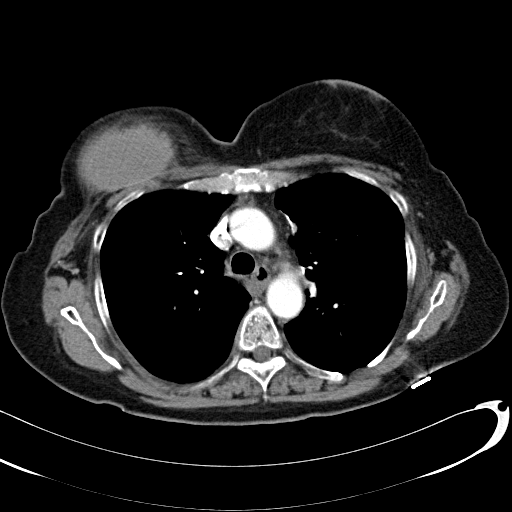
[im 41/59  lung]
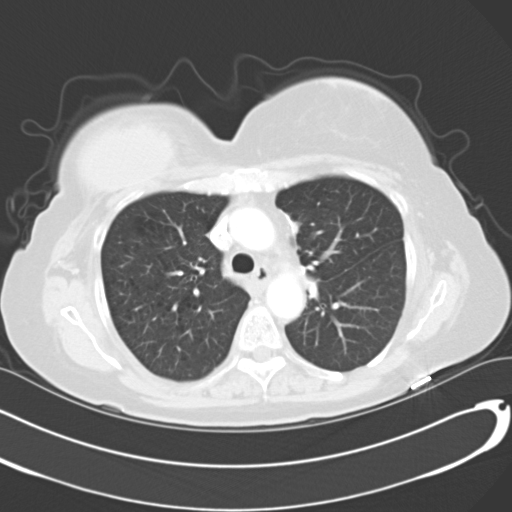
[im 45/59  lung]
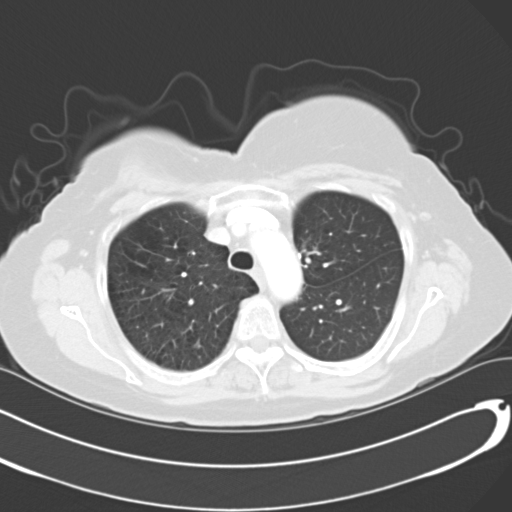
[im 50/59  lung]
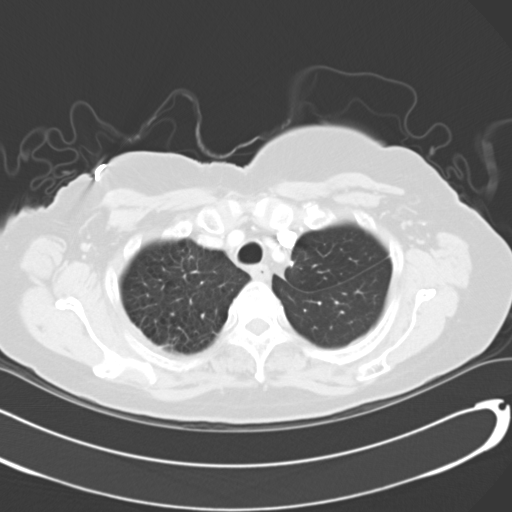
[im 54/59  lung]
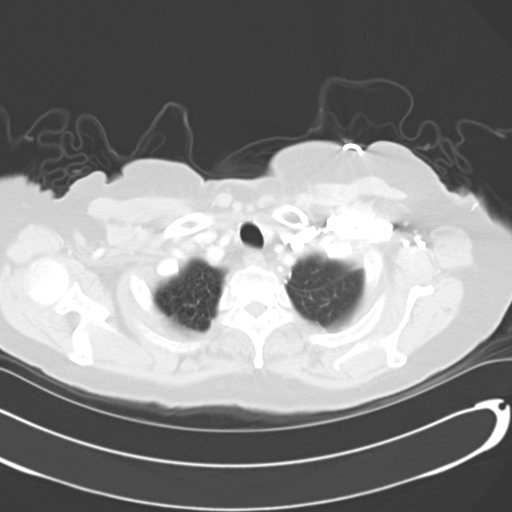

[Series 602: <mpr thick range> · coronal · 0.71mm/px · 3 of 81 slices shown]
[im 17/81  lung]
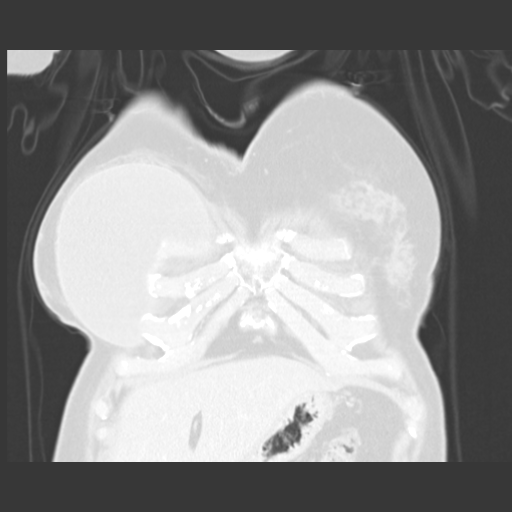
[im 33/81  lung]
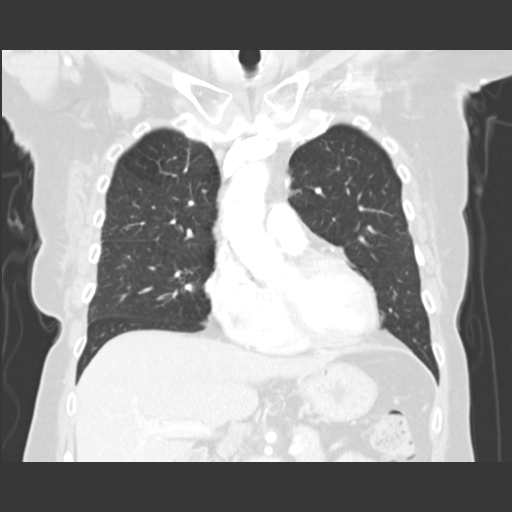
[im 49/81  lung]
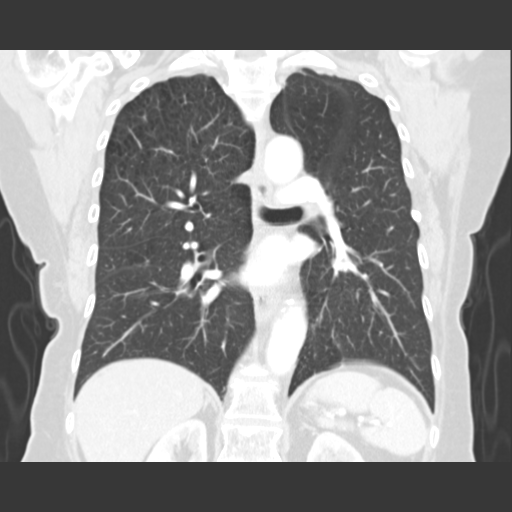

[15 of 36 positions shown; findings below may reference images not displayed]

FINDINGS: Mediastinum/Nodes: No axillary or supraclavicular lymphadenopathy.
Thyroid gland is normal. No mediastinal hilar lymphadenopathy. No
pericardial fluid. No central pulmonary embolism. Esophagus is
normal.

Lungs/Pleura: Mild centrilobular emphysema at the lung apices.
Peripheral nodule in the right middle lobe measures 6 mm (image 35,
series 5) compared to 7 mm on prior. Linear scarring in the inferior
right middle lobe stable compared to prior. Postsurgical change in
the left upper lobe without nodularity. There is mucoid impaction in
several of the left upper lobe bronchioles (image 17, series 5)
which is similar to [R5]. A focus of nodularity along the resection
margin in the medial left upper lung measures 6 mm unchanged from 7
mm on prior.

Upper abdomen: Limited view of the liver, kidneys, pancreas are
unremarkable. Normal adrenal glands.

Musculoskeletal: No aggressive osseous lesion. Subglandular right
breast implants noted.
IMPRESSION: 1. No suspicious nodularity to suggests lung cancer recurrence or
breast metastasis within the pulmonary parenchyma.
2. Stable bilateral small nodules compared to [DATE].
3. No evidence of acute infection

## 2014-04-20 MED ORDER — IOHEXOL 300 MG/ML  SOLN
80.0000 mL | Freq: Once | INTRAMUSCULAR | Status: AC | PRN
Start: 2014-04-20 — End: 2014-04-20
  Administered 2014-04-20: 80 mL via INTRAVENOUS

## 2014-04-20 NOTE — Telephone Encounter (Signed)
Gave avs & calendar for July/August.

## 2014-04-20 NOTE — Progress Notes (Signed)
[place] [phone]   [phone]  OFFICE PROGRESS NOTE  [name], MD [place] [address]  DIAGNOSIS:  1) stage IIA non-small cell lung cancer diagnosed in March of 2006 2) recurrent ductal carcinoma in situ of the right breast initially diagnosed in January of 2014 with recurrence in October of 2014.  PRIOR THERAPY: 1) status post left trisegmentectomy with node dissection on 06/24/2004 2) status post 4 cycles of adjuvant chemotherapy with carboplatin and paclitaxel last dose was given 10/24/2004. 3) status post right partial mastectomy on 04/10/2012 under the care of Dr. Brantley Stage. 4) status post adjuvant radiotherapy to the right breast under the care of Dr. Valere Dross  CURRENT THERAPY: Tamoxifen 20 mg by mouth daily started in May of 2014.  INTERVAL HISTORY: [name] 64 y.o. female returns to the clinic today for followup visit. The patient is feeling fine today with no specific complaints. She was a little bit very anxious about the results of her CT scan of the chest which was ordered after the chest x-ray showed some suspicious findings. She is tolerating her treatment with tamoxifen fairly well. She denied having any uterine bleeding. She has no chest pain, shortness of breath, cough or hemoptysis. She denied having any significant nausea or vomiting, no significant weight loss or night sweats. She is here today for evaluation and discussion of her lab and scan results.  MEDICAL HISTORY: Past Medical History  Diagnosis Date  . Radiation 07/03/12-07/09/12    Right upper lung 5400 cGy  . Anxiety   . Osteoarthritis   . DDD (degenerative disc disease)     neck  . Thyroid nodule, toxic or with hyperthyroidism     no current med.  Marland Kitchen History of breast cancer   . PONV (postoperative nausea and vomiting)   . Sinus headache     occasional  . GERD (gastroesophageal reflux disease)     occasional - no  current med.  . Eczema   . Urinary, incontinence, stress female   . History of lung cancer     non-small cell - left upper lobe    ALLERGIES:  is allergic to amoxicillin; asa; banana; ceftin; lactose intolerance (gi); orange fruit; penicillins; and adhesive.  MEDICATIONS:  Current Outpatient Prescriptions  Medication Sig Dispense Refill  . cycloSPORINE (RESTASIS) 0.05 % ophthalmic emulsion 1 drop 2 (two) times daily.    . fluocinonide gel (LIDEX) 0.05 %     . guaifenesin (HUMIBID E) 400 MG TABS Take 400 mg by mouth as needed (cold).     . Multiple Vitamin (MULTIVITAMIN) capsule Take 1 capsule by mouth.    Marland Kitchen PATANOL 0.1 % ophthalmic solution Place 1 drop into both eyes as needed for allergies.     . tamoxifen (NOLVADEX) 20 MG tablet Take 1 tablet (20 mg total) by mouth daily. 30 tablet 5  . vitamin E 400 UNIT capsule Take 400 Units by mouth.    Marland Kitchen acetaminophen (TYLENOL) 500 MG tablet Take 1,000 mg by mouth every 6 (six) hours as needed (arthritis).    Marland Kitchen lidocaine-prilocaine (EMLA) cream Apply 1 hour before procedure as instructed    . Polyethyl Glycol-Propyl Glycol (SYSTANE OP) Apply 1 drop to eye as needed (dry eyes).      No current facility-administered medications for this visit.    SURGICAL HISTORY:  Past Surgical History  Procedure Laterality Date  . Video assisted thoracoscopy (vats)/ lobectomy Left  07/14/2004    upper tri-segmentectomy  . Wisdom tooth extraction      "3"  . Trigger finger release Bilateral     "I've had OR on q finger" (05/21/2013)  . Nasal sinus surgery  ?[id number]    "at least 3" (05/21/2013)  . Appendectomy  1971  . Partial mastectomy with needle localization  04/10/2012    Procedure: PARTIAL MASTECTOMY WITH NEEDLE LOCALIZATION;  Surgeon: Marcello Moores A. Cornett, MD;  Location: [facility];  Service: General;  Laterality: Right;  . Mouth surgery Right may 2014    "keratocystic odotogenic tumor" - X 2  . Mastectomy w/ sentinel node biopsy Right  05/20/2013    Procedure: RIGHT SIMPLEMASTECTOMY AND SENTINEL NODE   Sampling;  Surgeon: Joyice Faster. Cornett, MD;  Location: [facility];  Service: General;  Laterality: Right;  . Breast reconstruction with placement of tissue expander and flex hd (acellular hydrated dermis) Right 05/20/2013    Procedure: IMMEDIATE RIGHT BREAST RECONSTRUCTION WITH LATISSAMUS MUSCLE FLAP AND PLACEMENT OF TISSUE EXPANDER TO RIGHT BREAST;  Surgeon: Theodoro Kos, DO;  Location: [facility];  Service: Plastics;  Laterality: Right;  . Thoracotomy Left 07/14/2004  . Trigger finger release Right 07/14/1999    index and little fingers  . Trigger finger release Right 07/17/2002    long and ring fingers  . De quervain's release Left 07/31/2002  . Trigger finger release Left 07/31/2002    little, ring and index fingers  . Removal of tissue expander and placement of implant Right 10/22/2013    Procedure: REMOVAL OF RIGHT TISSUE EXPANDER WITH PLACEMENT OF RIGHT BREAST IMPLANT;  Surgeon: Theodoro Kos, DO;  Location: [facility];  Service: Plastics;  Laterality: Right;  . Breast reduction with mastopexy Left 10/22/2013    Procedure: BREAST REDUCTION/MASTOPEXY TO LEFT BREAST WITH LIPOSUCTION/FAT GRAFTING FOR SYMMETRY;  Surgeon: Theodoro Kos, DO;  Location: [facility];  Service: Plastics;  Laterality: Left;    REVIEW OF SYSTEMS:  Constitutional: negative Eyes: negative Ears, nose, mouth, throat, and face: negative Respiratory: negative Cardiovascular: negative Gastrointestinal: negative Genitourinary:negative Integument/breast: negative Hematologic/lymphatic: negative Musculoskeletal:negative Neurological: negative Behavioral/Psych: negative Endocrine: negative Allergic/Immunologic: negative   PHYSICAL EXAMINATION: General appearance: alert, cooperative and no distress Head: Normocephalic, without obvious abnormality, atraumatic Neck: no adenopathy Lymph nodes: Cervical, supraclavicular, and axillary nodes  normal. Resp: clear to auscultation bilaterally Cardio: regular rate and rhythm, S1, S2 normal, no murmur, click, rub or gallop GI: soft, non-tender; bowel sounds normal; no masses,  no organomegaly Extremities: extremities normal, atraumatic, no cyanosis or edema Neurologic: Alert and oriented X 3, normal strength and tone. Normal symmetric reflexes. Normal coordination and gait  ECOG PERFORMANCE STATUS: 0 - Asymptomatic  Blood pressure 124/66, pulse 70, temperature 97.7 F (36.5 C), temperature source Oral, resp. rate 18, height 5\' 4"  (1.626 m), weight 159 lb 14.4 oz (72.53 kg), SpO2 100 %.  LABORATORY DATA: Lab Results  Component Value Date   WBC 4.2 04/13/2014   HGB 12.3 04/13/2014   HCT 38.1 04/13/2014   MCV 92.5 04/13/2014   PLT 336 04/13/2014      Chemistry      Component Value Date/Time   NA 141 04/13/2014 0837   NA 139 05/20/2013 1830   K 4.2 04/13/2014 0837   K 5.1 05/20/2013 1830   CL 102 05/20/2013 1830   CL 108* 04/28/2012 1008   CO2 25 04/13/2014 0837   CO2 25 05/20/2013 1830   BUN 14.8 04/13/2014 0837   BUN 12  05/20/2013 1830   CREATININE 0.8 04/13/2014 0837   CREATININE 0.69 05/20/2013 1830      Component Value Date/Time   CALCIUM 8.9 04/13/2014 0837   CALCIUM 8.6 05/20/2013 1830   ALKPHOS 58 04/13/2014 0837   ALKPHOS 83 04/08/2012 0900   AST 14 04/13/2014 0837   AST 21 04/08/2012 0900   ALT 12 04/13/2014 0837   ALT 16 04/08/2012 0900   BILITOT 0.23 04/13/2014 0837   BILITOT 0.3 04/08/2012 0900       RADIOGRAPHIC STUDIES: Dg Chest 2 View  04/13/2014   CLINICAL DATA:  64 year old female with a history of lung cancer treatment 10 years prior. Breast cancer diagnosis December 2014 and November 2015.  EXAM: CHEST - 2 VIEW  COMPARISON:  Chest x-ray dated 02/22/2013. CT dated 06/01/2009. There are no interval chest CTs available for review between 2011 and the current given history of recurrent carcinoma/metastases.  FINDINGS: Cardiomediastinal  silhouette is similar to the prior, with tortuosity of the descending thoracic aorta. No cardiomegaly.  No evidence of pulmonary vascular congestion.  Hazy opacity at the right lung base, more pronounced than the comparison plain film. Surgical clips are now present at the right lung base, not previously present on the chest x-ray.  Nodule at the right lung apex, just inferior to the clavicle, may have been present on the comparison plain film.  No pneumothorax or pleural effusion.  Architectural distortion at the base of the fissure on the left, at the interface of the diaphragm.  Surgical changes of the posterior left thorax.  Degenerative changes of the bilateral glenohumeral joint.  Atherosclerotic calcifications of the aortic arch.  IMPRESSION: Hazy opacity at the right lung base in a region of interval surgical clips may be postoperative in nature, however, this appearance can be seen with infection, and correlation with the patient's presentation is recommended.  Right apical nodule and architectural distortion at the bilateral lung bases in this patient with a given history of breast cancer and lung cancer. There are no CT studies for comparison from the patient's prior chest CT in 2011 until currently, spanning a time frame where there is a given history of recurrence/metastases. Further evaluation with chest CT is recommended.  Atherosclerosis  These results will be called to the ordering clinician or representative by the Radiologist Assistant, and communication documented in the PACS or zVision Dashboard.  Signed,  Dulcy Fanny. Earleen Newport, DO  [facility]  [facility]   Electronically Signed   By: Corrie Mckusick D.O.   On: 04/13/2014 10:12   Ct Chest W Contrast  04/20/2014   CLINICAL DATA:  Lung cancer diagnosed 2006 with left upper lobectomy and chemotherapy. Additional history of breast cancer diagnosed 2013 with right mastectomy radiation therapy. Pulmonary  opacities on recent chest radiograph.  EXAM: CT CHEST WITH CONTRAST  TECHNIQUE: Multidetector CT imaging of the chest was performed during intravenous contrast administration.  CONTRAST:  58mL OMNIPAQUE IOHEXOL 300 MG/ML  SOLN  COMPARISON:  Radiograph 04/13/2014, CT chest 06/01/2009  FINDINGS: Mediastinum/Nodes: No axillary or supraclavicular lymphadenopathy. Thyroid gland is normal. No mediastinal hilar lymphadenopathy. No pericardial fluid. No central pulmonary embolism. Esophagus is normal.  Lungs/Pleura: Mild centrilobular emphysema at the lung apices. Peripheral nodule in the right middle lobe measures 6 mm (image 35, series 5) compared to 7 mm on prior. Linear scarring in the inferior right middle lobe stable compared to prior. Postsurgical change in the left upper lobe without nodularity. There is mucoid impaction in  several of the left upper lobe bronchioles (image 17, series 5) which is similar to 2011. A focus of nodularity along the resection margin in the medial left upper lung measures 6 mm unchanged from 7 mm on prior.  Upper abdomen: Limited view of the liver, kidneys, pancreas are unremarkable. Normal adrenal glands.  Musculoskeletal: No aggressive osseous lesion. Subglandular right breast implants noted.  IMPRESSION: 1. No suspicious nodularity to suggests lung cancer recurrence or breast metastasis within the pulmonary parenchyma. 2. Stable bilateral small nodules compared to 06/01/2009. 3. No evidence of acute infection   Electronically Signed   By: Suzy Bouchard M.D.   On: 04/20/2014 08:06   ASSESSMENT AND PLAN: This is a very pleasant 64 years old white female with history of stage IIA non-small cell lung cancer as well as recurrent right breast ductal carcinoma in situ. She status post right lumpectomy followed by adjuvant radiotherapy. I had a lengthy discussion with the patient today about her current condition and treatment options.  The recent blood work as well as a CT scan of the  chest showed no significant suspicious finding for disease recurrence from either the breast or lung cancers.  I discussed the lab and his scan results with the patient. I recommended for her to continue her current treatment with tamoxifen. I would see her back for follow-up visit in 6 months with repeat blood work. She was advised to call immediately if she has any concerning symptoms in the interval. All questions were answered. The patient knows to call the clinic with any problems, questions or concerns. We can certainly see the patient much sooner if necessary. Disclaimer: This note was dictated with voice recognition software. Similar sounding words can inadvertently be transcribed and may be missed upon review.

## 2014-05-15 ENCOUNTER — Encounter: Payer: Self-pay | Admitting: Gastroenterology

## 2014-06-03 ENCOUNTER — Other Ambulatory Visit: Payer: Self-pay | Admitting: Family Medicine

## 2014-06-03 ENCOUNTER — Other Ambulatory Visit (HOSPITAL_COMMUNITY)
Admission: RE | Admit: 2014-06-03 | Discharge: 2014-06-03 | Disposition: A | Discharge status: Home / Self Care | Payer: 59 | Source: Ambulatory Visit | Attending: Family Medicine | Admitting: Family Medicine

## 2014-06-03 DIAGNOSIS — Z01419 Encounter for gynecological examination (general) (routine) without abnormal findings: Secondary | ICD-10-CM | POA: Insufficient documentation

## 2014-06-04 LAB — CYTOLOGY - PAP

## 2014-07-19 ENCOUNTER — Other Ambulatory Visit: Payer: Self-pay

## 2014-07-19 DIAGNOSIS — Z1231 Encounter for screening mammogram for malignant neoplasm of breast: Secondary | ICD-10-CM

## 2014-07-20 ENCOUNTER — Other Ambulatory Visit: Payer: Self-pay | Admitting: Internal Medicine

## 2014-07-22 ENCOUNTER — Ambulatory Visit: Payer: 59

## 2014-09-16 ENCOUNTER — Ambulatory Visit
Admission: RE | Admit: 2014-09-16 | Discharge: 2014-09-16 | Disposition: A | Discharge status: Home / Self Care | Payer: 59 | Source: Ambulatory Visit

## 2014-09-16 DIAGNOSIS — Z1231 Encounter for screening mammogram for malignant neoplasm of breast: Secondary | ICD-10-CM

## 2014-10-19 ENCOUNTER — Other Ambulatory Visit (HOSPITAL_BASED_OUTPATIENT_CLINIC_OR_DEPARTMENT_OTHER): Payer: 59

## 2014-10-19 DIAGNOSIS — D0511 Intraductal carcinoma in situ of right breast: Secondary | ICD-10-CM | POA: Diagnosis not present

## 2014-10-19 DIAGNOSIS — C50911 Malignant neoplasm of unspecified site of right female breast: Secondary | ICD-10-CM

## 2014-10-19 DIAGNOSIS — C3400 Malignant neoplasm of unspecified main bronchus: Secondary | ICD-10-CM

## 2014-10-19 LAB — CBC WITH DIFFERENTIAL/PLATELET
BASO%: 0.4 % (ref 0.0–2.0)
Basophils Absolute: 0 10*3/uL (ref 0.0–0.1)
EOS ABS: 0.4 10*3/uL (ref 0.0–0.5)
EOS%: 7.3 % — AB (ref 0.0–7.0)
HCT: 40.3 % (ref 34.8–46.6)
HGB: 13.3 g/dL (ref 11.6–15.9)
LYMPH%: 34 % (ref 14.0–49.7)
MCH: 31.1 pg (ref 25.1–34.0)
MCHC: 33 g/dL (ref 31.5–36.0)
MCV: 94.2 fL (ref 79.5–101.0)
MONO#: 0.4 10*3/uL (ref 0.1–0.9)
MONO%: 8.1 % (ref 0.0–14.0)
NEUT#: 2.5 10*3/uL (ref 1.5–6.5)
NEUT%: 50.2 % (ref 38.4–76.8)
PLATELETS: 277 10*3/uL (ref 145–400)
RBC: 4.28 10*6/uL (ref 3.70–5.45)
RDW: 13.4 % (ref 11.2–14.5)
WBC: 4.9 10*3/uL (ref 3.9–10.3)
lymph#: 1.7 10*3/uL (ref 0.9–3.3)

## 2014-10-19 LAB — COMPREHENSIVE METABOLIC PANEL (CC13)
ALBUMIN: 3.3 g/dL — AB (ref 3.5–5.0)
ALK PHOS: 48 U/L (ref 40–150)
ALT: 14 U/L (ref 0–55)
AST: 17 U/L (ref 5–34)
Anion Gap: 8 mEq/L (ref 3–11)
BILIRUBIN TOTAL: 0.34 mg/dL (ref 0.20–1.20)
BUN: 14.6 mg/dL (ref 7.0–26.0)
CO2: 25 meq/L (ref 22–29)
Calcium: 9.1 mg/dL (ref 8.4–10.4)
Chloride: 109 mEq/L (ref 98–109)
Creatinine: 0.8 mg/dL (ref 0.6–1.1)
EGFR: 78 mL/min/{1.73_m2} — ABNORMAL LOW (ref >90)
Glucose: 102 mg/dl (ref 70–140)
Potassium: 4.5 mEq/L (ref 3.5–5.1)
SODIUM: 142 meq/L (ref 136–145)
Total Protein: 6.3 g/dL — ABNORMAL LOW (ref 6.4–8.3)

## 2014-10-19 LAB — CANCER ANTIGEN 27.29: CA 27.29: 31 U/mL (ref 0–39)

## 2014-10-26 ENCOUNTER — Telehealth: Payer: Self-pay | Admitting: Internal Medicine

## 2014-10-26 ENCOUNTER — Ambulatory Visit (HOSPITAL_BASED_OUTPATIENT_CLINIC_OR_DEPARTMENT_OTHER): Payer: 59 | Admitting: Internal Medicine

## 2014-10-26 ENCOUNTER — Encounter: Payer: Self-pay | Admitting: Internal Medicine

## 2014-10-26 VITALS — BP 145/75 | HR 70 | Temp 98.1°F | Resp 18 | Ht 64.0 in | Wt 160.6 lb

## 2014-10-26 DIAGNOSIS — D0511 Intraductal carcinoma in situ of right breast: Secondary | ICD-10-CM | POA: Diagnosis not present

## 2014-10-26 DIAGNOSIS — C3400 Malignant neoplasm of unspecified main bronchus: Secondary | ICD-10-CM

## 2014-10-26 DIAGNOSIS — C50911 Malignant neoplasm of unspecified site of right female breast: Secondary | ICD-10-CM

## 2014-10-26 DIAGNOSIS — Z85118 Personal history of other malignant neoplasm of bronchus and lung: Secondary | ICD-10-CM | POA: Diagnosis not present

## 2014-10-26 NOTE — Telephone Encounter (Signed)
per pof to sch pt appt-gave pt copy of avs °

## 2014-10-26 NOTE — Progress Notes (Signed)
[place] Telephone:([phone]   Fax:(336) [phone]  OFFICE PROGRESS NOTE  [name], MD [place] [address]  DIAGNOSIS:  1) stage IIA non-small cell lung cancer diagnosed in March of 2006 2) recurrent ductal carcinoma in situ of the right breast initially diagnosed in January of 2014 with recurrence in October of 2014.  PRIOR THERAPY: 1) status post left trisegmentectomy with node dissection on 06/24/2004 2) status post 4 cycles of adjuvant chemotherapy with carboplatin and paclitaxel last dose was given 10/24/2004. 3) status post right partial mastectomy on 04/10/2012 under the care of Dr. Brantley Stage. 4) status post adjuvant radiotherapy to the right breast under the care of Dr. Valere Dross  CURRENT THERAPY: Tamoxifen 20 mg by mouth daily started in May of 2014.  INTERVAL HISTORY: [name] 64 y.o. female returns to the clinic today for six-month followup visit. The patient is feeling fine today with no specific complaints. She is tolerating her treatment with tamoxifen fairly well. Her last mammogram as well as Pap smear were unremarkable for any malignancy. She denied having any uterine bleeding. She has no chest pain, shortness of breath, cough or hemoptysis. She denied having any significant nausea or vomiting, no significant weight loss or night sweats. She had repeat bloodwork performed recently and she is here for evaluation and discussion of her lab results.  MEDICAL HISTORY: Past Medical History  Diagnosis Date  . Radiation 07/03/12-07/09/12    Right upper lung 5400 cGy  . Anxiety   . Osteoarthritis   . DDD (degenerative disc disease)     neck  . Thyroid nodule, toxic or with hyperthyroidism     no current med.  Marland Kitchen History of breast cancer   . PONV (postoperative nausea and vomiting)   . Sinus headache     occasional  . GERD (gastroesophageal reflux disease)     occasional - no current med.  . Eczema   .  Urinary, incontinence, stress female   . History of lung cancer     non-small cell - left upper lobe    ALLERGIES:  is allergic to amoxicillin; asa; banana; ceftin; lactose intolerance (gi); orange fruit; penicillins; and adhesive.  MEDICATIONS:  Current Outpatient Prescriptions  Medication Sig Dispense Refill  . acetaminophen (TYLENOL) 500 MG tablet Take 1,000 mg by mouth every 6 (six) hours as needed (arthritis).    . cycloSPORINE (RESTASIS) 0.05 % ophthalmic emulsion 1 drop 2 (two) times daily.    . fluocinonide gel (LIDEX) 0.05 %     . fluticasone (FLONASE) 50 MCG/ACT nasal spray Place 1 spray into both nostrils daily.    . Multiple Vitamin (MULTIVITAMIN) capsule Take 1 capsule by mouth.    Marland Kitchen PATANOL 0.1 % ophthalmic solution Place 1 drop into both eyes as needed for allergies.     Vladimir Faster Glycol-Propyl Glycol (SYSTANE OP) Apply 1 drop to eye as needed (dry eyes).     . tamoxifen (NOLVADEX) 20 MG tablet TAKE 1 TABLET (20 MG TOTAL) BY MOUTH DAILY. 30 tablet 5  . vitamin E 400 UNIT capsule Take 400 Units by mouth.    . guaifenesin (HUMIBID E) 400 MG TABS Take 400 mg by mouth as needed (cold).      No current facility-administered medications for this visit.    SURGICAL HISTORY:  Past Surgical History  Procedure Laterality Date  . Video assisted thoracoscopy (vats)/ lobectomy Left 07/14/2004    upper tri-segmentectomy  .  Wisdom tooth extraction      "3"  . Trigger finger release Bilateral     "I've had OR on q finger" (05/21/2013)  . Nasal sinus surgery  ?[id number]    "at least 3" (05/21/2013)  . Appendectomy  1971  . Partial mastectomy with needle localization  04/10/2012    Procedure: PARTIAL MASTECTOMY WITH NEEDLE LOCALIZATION;  Surgeon: Marcello Moores A. Cornett, MD;  Location: [facility];  Service: General;  Laterality: Right;  . Mouth surgery Right may 2014    "keratocystic odotogenic tumor" - X 2  . Mastectomy w/ sentinel node biopsy Right 05/20/2013     Procedure: RIGHT SIMPLEMASTECTOMY AND SENTINEL NODE   Sampling;  Surgeon: Joyice Faster. Cornett, MD;  Location: [facility];  Service: General;  Laterality: Right;  . Breast reconstruction with placement of tissue expander and flex hd (acellular hydrated dermis) Right 05/20/2013    Procedure: IMMEDIATE RIGHT BREAST RECONSTRUCTION WITH LATISSAMUS MUSCLE FLAP AND PLACEMENT OF TISSUE EXPANDER TO RIGHT BREAST;  Surgeon: Theodoro Kos, DO;  Location: [facility];  Service: Plastics;  Laterality: Right;  . Thoracotomy Left 07/14/2004  . Trigger finger release Right 07/14/1999    index and little fingers  . Trigger finger release Right 07/17/2002    long and ring fingers  . De quervain's release Left 07/31/2002  . Trigger finger release Left 07/31/2002    little, ring and index fingers  . Removal of tissue expander and placement of implant Right 10/22/2013    Procedure: REMOVAL OF RIGHT TISSUE EXPANDER WITH PLACEMENT OF RIGHT BREAST IMPLANT;  Surgeon: Theodoro Kos, DO;  Location: [facility];  Service: Plastics;  Laterality: Right;  . Breast reduction with mastopexy Left 10/22/2013    Procedure: BREAST REDUCTION/MASTOPEXY TO LEFT BREAST WITH LIPOSUCTION/FAT GRAFTING FOR SYMMETRY;  Surgeon: Theodoro Kos, DO;  Location: [facility];  Service: Plastics;  Laterality: Left;    REVIEW OF SYSTEMS:  A comprehensive review of systems was negative.   PHYSICAL EXAMINATION: General appearance: alert, cooperative and no distress Head: Normocephalic, without obvious abnormality, atraumatic Neck: no adenopathy Lymph nodes: Cervical, supraclavicular, and axillary nodes normal. Resp: clear to auscultation bilaterally Cardio: regular rate and rhythm, S1, S2 normal, no murmur, click, rub or gallop GI: soft, non-tender; bowel sounds normal; no masses,  no organomegaly Extremities: extremities normal, atraumatic, no cyanosis or edema Neurologic: Alert and oriented X 3, normal strength and tone. Normal symmetric  reflexes. Normal coordination and gait  ECOG PERFORMANCE STATUS: 0 - Asymptomatic  Blood pressure 145/75, pulse 70, temperature 98.1 F (36.7 C), temperature source Oral, resp. rate 18, height '5\' 4"'$  (1.626 m), weight 160 lb 9.6 oz (72.848 kg), SpO2 100 %.  LABORATORY DATA: Lab Results  Component Value Date   WBC 4.9 10/19/2014   HGB 13.3 10/19/2014   HCT 40.3 10/19/2014   MCV 94.2 10/19/2014   PLT 277 10/19/2014      Chemistry      Component Value Date/Time   NA 142 10/19/2014 0805   NA 139 05/20/2013 1830   K 4.5 10/19/2014 0805   K 5.1 05/20/2013 1830   CL 102 05/20/2013 1830   CL 108* 04/28/2012 1008   CO2 25 10/19/2014 0805   CO2 25 05/20/2013 1830   BUN 14.6 10/19/2014 0805   BUN 12 05/20/2013 1830   CREATININE 0.8 10/19/2014 0805   CREATININE 0.69 05/20/2013 1830      Component Value Date/Time   CALCIUM 9.1 10/19/2014 0805   CALCIUM 8.6  05/20/2013 1830   ALKPHOS 48 10/19/2014 0805   ALKPHOS 83 04/08/2012 0900   AST 17 10/19/2014 0805   AST 21 04/08/2012 0900   ALT 14 10/19/2014 0805   ALT 16 04/08/2012 0900   BILITOT 0.34 10/19/2014 0805   BILITOT 0.3 04/08/2012 0900       RADIOGRAPHIC STUDIES: No results found. ASSESSMENT AND PLAN: This is a very pleasant 64 years old white female with history of stage IIA non-small cell lung cancer as well as recurrent right breast ductal carcinoma in situ. She status post right lumpectomy followed by adjuvant radiotherapy. I had a lengthy discussion with the patient today about her current condition and treatment options.  The recent blood work was unremarkable for any abnormality. I discussed the lab results with the patient. I recommended for her to continue her current treatment with tamoxifen. I would see her back for follow-up visit in 6 months with repeat blood work in addition to chest x-ray. She was advised to call immediately if she has any concerning symptoms in the interval. All questions were answered. The  patient knows to call the clinic with any problems, questions or concerns. We can certainly see the patient much sooner if necessary. Disclaimer: This note was dictated with voice recognition software. Similar sounding words can inadvertently be transcribed and may be missed upon review.

## 2015-01-20 ENCOUNTER — Other Ambulatory Visit: Payer: Self-pay | Admitting: Internal Medicine

## 2015-01-20 NOTE — Telephone Encounter (Signed)
MD last office note pt to continue on Tamoxifen f/u in 6 months.

## 2015-04-21 ENCOUNTER — Ambulatory Visit (HOSPITAL_COMMUNITY)
Admission: RE | Admit: 2015-04-21 | Discharge: 2015-04-21 | Disposition: A | Discharge status: Home / Self Care | Payer: 59 | Source: Ambulatory Visit | Attending: Internal Medicine | Admitting: Internal Medicine

## 2015-04-21 ENCOUNTER — Other Ambulatory Visit (HOSPITAL_BASED_OUTPATIENT_CLINIC_OR_DEPARTMENT_OTHER): Payer: 59

## 2015-04-21 DIAGNOSIS — C3400 Malignant neoplasm of unspecified main bronchus: Secondary | ICD-10-CM | POA: Insufficient documentation

## 2015-04-21 DIAGNOSIS — D0511 Intraductal carcinoma in situ of right breast: Secondary | ICD-10-CM | POA: Diagnosis not present

## 2015-04-21 DIAGNOSIS — C50911 Malignant neoplasm of unspecified site of right female breast: Secondary | ICD-10-CM

## 2015-04-21 DIAGNOSIS — J984 Other disorders of lung: Secondary | ICD-10-CM | POA: Diagnosis not present

## 2015-04-21 DIAGNOSIS — Z87891 Personal history of nicotine dependence: Secondary | ICD-10-CM | POA: Insufficient documentation

## 2015-04-21 LAB — COMPREHENSIVE METABOLIC PANEL
ALBUMIN: 3.4 g/dL — AB (ref 3.5–5.0)
ALK PHOS: 50 U/L (ref 40–150)
ALT: 14 U/L (ref 0–55)
AST: 15 U/L (ref 5–34)
Anion Gap: 10 mEq/L (ref 3–11)
BUN: 14.3 mg/dL (ref 7.0–26.0)
CO2: 22 meq/L (ref 22–29)
Calcium: 8.9 mg/dL (ref 8.4–10.4)
Chloride: 110 mEq/L — ABNORMAL HIGH (ref 98–109)
Creatinine: 0.8 mg/dL (ref 0.6–1.1)
EGFR: 81 mL/min/{1.73_m2} — AB (ref >90)
GLUCOSE: 102 mg/dL (ref 70–140)
POTASSIUM: 4.1 meq/L (ref 3.5–5.1)
SODIUM: 141 meq/L (ref 136–145)
Total Bilirubin: <0.3 mg/dL (ref 0.20–1.20)
Total Protein: 6.7 g/dL (ref 6.4–8.3)

## 2015-04-21 LAB — CBC WITH DIFFERENTIAL/PLATELET
BASO%: 0.2 % (ref 0.0–2.0)
Basophils Absolute: 0 10*3/uL (ref 0.0–0.1)
EOS ABS: 0.4 10*3/uL (ref 0.0–0.5)
EOS%: 7.4 % — ABNORMAL HIGH (ref 0.0–7.0)
HCT: 39.1 % (ref 34.8–46.6)
HEMOGLOBIN: 13 g/dL (ref 11.6–15.9)
LYMPH#: 1.3 10*3/uL (ref 0.9–3.3)
LYMPH%: 25.1 % (ref 14.0–49.7)
MCH: 30.4 pg (ref 25.1–34.0)
MCHC: 33.2 g/dL (ref 31.5–36.0)
MCV: 91.6 fL (ref 79.5–101.0)
MONO#: 0.5 10*3/uL (ref 0.1–0.9)
MONO%: 9.4 % (ref 0.0–14.0)
NEUT%: 57.9 % (ref 38.4–76.8)
NEUTROS ABS: 2.9 10*3/uL (ref 1.5–6.5)
NRBC: 0 % (ref 0–0)
Platelets: 267 10*3/uL (ref 145–400)
RBC: 4.27 10*6/uL (ref 3.70–5.45)
RDW: 13.7 % (ref 11.2–14.5)
WBC: 5 10*3/uL (ref 3.9–10.3)

## 2015-04-21 LAB — CANCER ANTIGEN 27-29 (PARALLEL TESTING): CA 27.29: 42 U/mL — ABNORMAL HIGH (ref 0–39)

## 2015-04-21 IMAGING — DX DG CHEST 1V
1 series · 1 of 1 positions shown · non-contrast
Comparison: [DATE]; correlation CT chest [DATE]

CLINICAL DATA: Lung cancer, RIGHT breast cancer, former smoker

EXAM:
CHEST 1 VIEW

[chest pa]
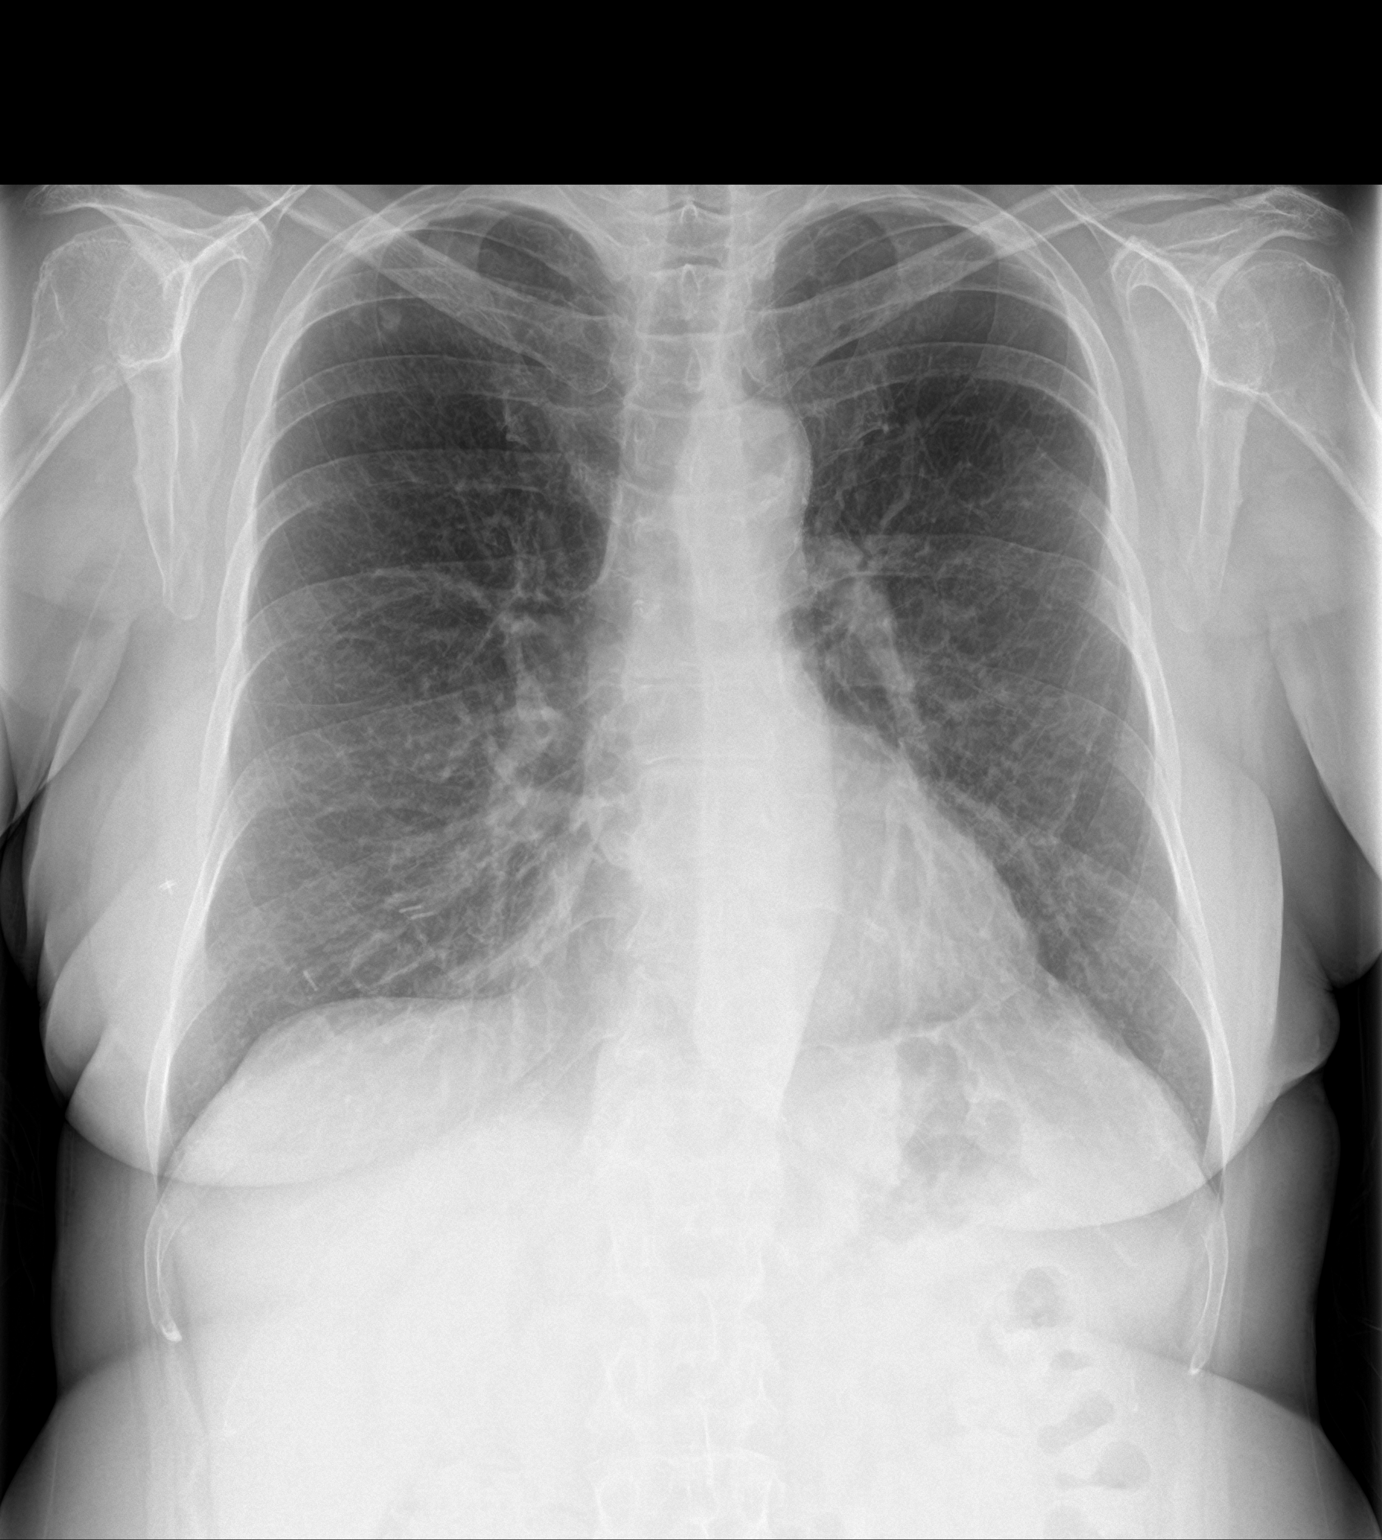

[1 of 1 positions shown; findings below may reference images not displayed]

FINDINGS: Normal heart size, mediastinal contours and pulmonary vascularity.

Atherosclerotic calcification aorta.

Persistent faint nodular density at RIGHT apex corresponds nodular
scarring on prior CT.

Streaky atelectasis versus scarring at lower RIGHT chest unchanged.

No acute infiltrate, pleural effusion or pneumothorax.

Bones demineralized.

Lytic focus involving posterior LEFT sixth rib again identified,
present since [DATE].
IMPRESSION: Nodular scarring RIGHT apex.

Chronic atelectasis versus scarring RIGHT base stable.

No new pulmonary abnormalities.

Chronic lytic process posterior LEFT sixth rib since [DATE].

## 2015-04-22 LAB — CANCER ANTIGEN 27.29: CA 27.29: 36.7 U/mL (ref 0.0–38.6)

## 2015-04-28 ENCOUNTER — Ambulatory Visit (HOSPITAL_BASED_OUTPATIENT_CLINIC_OR_DEPARTMENT_OTHER): Payer: 59 | Admitting: Internal Medicine

## 2015-04-28 ENCOUNTER — Encounter: Payer: Self-pay | Admitting: Internal Medicine

## 2015-04-28 ENCOUNTER — Telehealth: Payer: Self-pay | Admitting: Internal Medicine

## 2015-04-28 VITALS — BP 112/61 | HR 77 | Temp 98.4°F | Resp 18 | Ht 64.0 in | Wt 159.8 lb

## 2015-04-28 DIAGNOSIS — Z85118 Personal history of other malignant neoplasm of bronchus and lung: Secondary | ICD-10-CM | POA: Diagnosis not present

## 2015-04-28 DIAGNOSIS — D0511 Intraductal carcinoma in situ of right breast: Secondary | ICD-10-CM

## 2015-04-28 DIAGNOSIS — C3412 Malignant neoplasm of upper lobe, left bronchus or lung: Secondary | ICD-10-CM

## 2015-04-28 NOTE — Telephone Encounter (Signed)
per pof to sch pt appt-gave pt copy of avs °

## 2015-04-28 NOTE — Progress Notes (Signed)
[place] Telephone:(336) [phone]   Fax:(336) [phone]  OFFICE PROGRESS NOTE  [name], MD [place] [address]  DIAGNOSIS:  1) stage IIA non-small cell lung cancer diagnosed in March of 2006 2) recurrent ductal carcinoma in situ of the right breast initially diagnosed in January of 2014 with recurrence in October of 2014.  PRIOR THERAPY: 1) status post left trisegmentectomy with node dissection on 06/24/2004 2) status post 4 cycles of adjuvant chemotherapy with carboplatin and paclitaxel last dose was given 10/24/2004. 3) status post right partial mastectomy on 04/10/2012 under the care of Dr. Brantley Stage. 4) status post adjuvant radiotherapy to the right breast under the care of Dr. Valere Dross  CURRENT THERAPY: Tamoxifen 20 mg by mouth daily started in May of 2014.  INTERVAL HISTORY: [name] 65 y.o. female returns to the clinic today for six-month followup visit. The patient is feeling fine today with no specific complaints. She is tolerating her treatment with tamoxifen fairly well. Her last mammogram as well as Pap smear were unremarkable for any malignancy. She has no chest pain, shortness of breath, cough or hemoptysis. She denied having any significant nausea or vomiting, no significant weight loss or night sweats. She had repeat bloodwork as well as chest x-ray performed recently and she is here for evaluation and discussion of her lab and imaging results.  MEDICAL HISTORY: Past Medical History  Diagnosis Date  . Radiation 07/03/12-07/09/12    Right upper lung 5400 cGy  . Anxiety   . Osteoarthritis   . DDD (degenerative disc disease)     neck  . Thyroid nodule, toxic or with hyperthyroidism     no current med.  Marland Kitchen History of breast cancer   . PONV (postoperative nausea and vomiting)   . Sinus headache     occasional  . GERD (gastroesophageal reflux disease)     occasional - no current med.  . Eczema   .  Urinary, incontinence, stress female   . History of lung cancer     non-small cell - left upper lobe    ALLERGIES:  is allergic to amoxicillin; asa; banana; ceftin; lactose intolerance (gi); orange fruit; penicillins; and adhesive.  MEDICATIONS:  Current Outpatient Prescriptions  Medication Sig Dispense Refill  . acetaminophen (TYLENOL) 500 MG tablet Take 1,000 mg by mouth every 6 (six) hours as needed (arthritis).    . cycloSPORINE (RESTASIS) 0.05 % ophthalmic emulsion 1 drop 2 (two) times daily.    . fluocinonide gel (LIDEX) 0.05 %     . fluticasone (FLONASE) 50 MCG/ACT nasal spray Place 1 spray into both nostrils daily.    Marland Kitchen ibuprofen (ADVIL,MOTRIN) 600 MG tablet Take 600 mg by mouth 2 (two) times daily.  0  . Multiple Vitamin (MULTIVITAMIN) capsule Take 1 capsule by mouth.    Vladimir Faster Glycol-Propyl Glycol (SYSTANE OP) Apply 1 drop to eye as needed (dry eyes). Reported on 04/28/2015    . tamoxifen (NOLVADEX) 20 MG tablet TAKE 1 TABLET (20 MG TOTAL) BY MOUTH DAILY. 30 tablet 5  . vitamin E 400 UNIT capsule Take 400 Units by mouth.    Marland Kitchen PATANOL 0.1 % ophthalmic solution Place 1 drop into both eyes as needed for allergies. Reported on 04/28/2015     No current facility-administered medications for this visit.    SURGICAL HISTORY:  Past Surgical History  Procedure Laterality Date  . Video assisted thoracoscopy (vats)/ lobectomy Left 07/14/2004  upper tri-segmentectomy  . Wisdom tooth extraction      "3"  . Trigger finger release Bilateral     "I've had OR on q finger" (05/21/2013)  . Nasal sinus surgery  ?[id number]    "at least 3" (05/21/2013)  . Appendectomy  1971  . Partial mastectomy with needle localization  04/10/2012    Procedure: PARTIAL MASTECTOMY WITH NEEDLE LOCALIZATION;  Surgeon: Marcello Moores A. Cornett, MD;  Location: [facility];  Service: General;  Laterality: Right;  . Mouth surgery Right may 2014    "keratocystic odotogenic tumor" - X 2  . Mastectomy w/  sentinel node biopsy Right 05/20/2013    Procedure: RIGHT SIMPLEMASTECTOMY AND SENTINEL NODE   Sampling;  Surgeon: Joyice Faster. Cornett, MD;  Location: [facility];  Service: General;  Laterality: Right;  . Breast reconstruction with placement of tissue expander and flex hd (acellular hydrated dermis) Right 05/20/2013    Procedure: IMMEDIATE RIGHT BREAST RECONSTRUCTION WITH LATISSAMUS MUSCLE FLAP AND PLACEMENT OF TISSUE EXPANDER TO RIGHT BREAST;  Surgeon: Theodoro Kos, DO;  Location: [facility];  Service: Plastics;  Laterality: Right;  . Thoracotomy Left 07/14/2004  . Trigger finger release Right 07/14/1999    index and little fingers  . Trigger finger release Right 07/17/2002    long and ring fingers  . De quervain's release Left 07/31/2002  . Trigger finger release Left 07/31/2002    little, ring and index fingers  . Removal of tissue expander and placement of implant Right 10/22/2013    Procedure: REMOVAL OF RIGHT TISSUE EXPANDER WITH PLACEMENT OF RIGHT BREAST IMPLANT;  Surgeon: Theodoro Kos, DO;  Location: [facility];  Service: Plastics;  Laterality: Right;  . Breast reduction with mastopexy Left 10/22/2013    Procedure: BREAST REDUCTION/MASTOPEXY TO LEFT BREAST WITH LIPOSUCTION/FAT GRAFTING FOR SYMMETRY;  Surgeon: Theodoro Kos, DO;  Location: [facility];  Service: Plastics;  Laterality: Left;    REVIEW OF SYSTEMS:  A comprehensive review of systems was negative.   PHYSICAL EXAMINATION: General appearance: alert, cooperative and no distress Head: Normocephalic, without obvious abnormality, atraumatic Neck: no adenopathy Lymph nodes: Cervical, supraclavicular, and axillary nodes normal. Resp: clear to auscultation bilaterally Cardio: regular rate and rhythm, S1, S2 normal, no murmur, click, rub or gallop GI: soft, non-tender; bowel sounds normal; no masses,  no organomegaly Extremities: extremities normal, atraumatic, no cyanosis or edema Neurologic: Alert and oriented X 3,  normal strength and tone. Normal symmetric reflexes. Normal coordination and gait  ECOG PERFORMANCE STATUS: 0 - Asymptomatic  Blood pressure 112/61, pulse 77, temperature 98.4 F (36.9 C), temperature source Oral, resp. rate 18, height '5\' 4"'$  (1.626 m), weight 159 lb 12.8 oz (72.485 kg), SpO2 100 %.  LABORATORY DATA: Lab Results  Component Value Date   WBC 5.0 04/21/2015   HGB 13.0 04/21/2015   HCT 39.1 04/21/2015   MCV 91.6 04/21/2015   PLT 267 04/21/2015      Chemistry      Component Value Date/Time   NA 141 04/21/2015 0751   NA 139 05/20/2013 1830   K 4.1 04/21/2015 0751   K 5.1 05/20/2013 1830   CL 102 05/20/2013 1830   CL 108* 04/28/2012 1008   CO2 22 04/21/2015 0751   CO2 25 05/20/2013 1830   BUN 14.3 04/21/2015 0751   BUN 12 05/20/2013 1830   CREATININE 0.8 04/21/2015 0751   CREATININE 0.69 05/20/2013 1830      Component Value Date/Time   CALCIUM 8.9 04/21/2015 0751  CALCIUM 8.6 05/20/2013 1830   ALKPHOS 50 04/21/2015 0751   ALKPHOS 83 04/08/2012 0900   AST 15 04/21/2015 0751   AST 21 04/08/2012 0900   ALT 14 04/21/2015 0751   ALT 16 04/08/2012 0900   BILITOT <0.30 04/21/2015 0751   BILITOT 0.3 04/08/2012 0900       RADIOGRAPHIC STUDIES: Dg Chest 1 View  04/21/2015  CLINICAL DATA:  Lung cancer, RIGHT breast cancer, former smoker EXAM: CHEST 1 VIEW COMPARISON:  04/13/2014; correlation CT chest 04/20/2014 FINDINGS: Normal heart size, mediastinal contours and pulmonary vascularity. Atherosclerotic calcification aorta. Persistent faint nodular density at RIGHT apex corresponds nodular scarring on prior CT. Streaky atelectasis versus scarring at lower RIGHT chest unchanged. No acute infiltrate, pleural effusion or pneumothorax. Bones demineralized. Lytic focus involving posterior LEFT sixth rib again identified, present since 06/26/2005. IMPRESSION: Nodular scarring RIGHT apex. Chronic atelectasis versus scarring RIGHT base stable. No new pulmonary abnormalities.  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 Electronically Signed   By: Lavonia Dana M.D.   On: 04/21/2015 08:21   ASSESSMENT AND PLAN: This is a very pleasant 65 years old white female with history of stage IIA non-small cell lung cancer as well as recurrent right breast ductal carcinoma in situ. She status post right lumpectomy followed by adjuvant radiotherapy. The patient is doing fine and no evidence for disease progression. I recommended for her to continue her current treatment with tamoxifen. I would see her back for follow-up visit in 6 months with repeat blood work. She was advised to call immediately if she has any concerning symptoms in the interval. All questions were answered. The patient knows to call the clinic with any problems, questions or concerns. We can certainly see the patient much sooner if necessary. Disclaimer: This note was dictated with voice recognition software. Similar sounding words can inadvertently be transcribed and may be missed upon review.

## 2015-07-26 ENCOUNTER — Other Ambulatory Visit: Payer: Self-pay | Admitting: Internal Medicine

## 2015-08-23 ENCOUNTER — Other Ambulatory Visit: Payer: Self-pay

## 2015-08-23 DIAGNOSIS — Z1231 Encounter for screening mammogram for malignant neoplasm of breast: Secondary | ICD-10-CM

## 2015-08-23 DIAGNOSIS — Z853 Personal history of malignant neoplasm of breast: Secondary | ICD-10-CM

## 2015-09-19 ENCOUNTER — Other Ambulatory Visit: Payer: Self-pay | Admitting: Family Medicine

## 2015-09-19 ENCOUNTER — Ambulatory Visit
Admission: RE | Admit: 2015-09-19 | Discharge: 2015-09-19 | Disposition: A | Discharge status: Home / Self Care | Payer: 59 | Source: Ambulatory Visit

## 2015-09-19 DIAGNOSIS — Z1231 Encounter for screening mammogram for malignant neoplasm of breast: Secondary | ICD-10-CM

## 2015-09-19 DIAGNOSIS — Z853 Personal history of malignant neoplasm of breast: Secondary | ICD-10-CM

## 2015-10-25 ENCOUNTER — Other Ambulatory Visit (HOSPITAL_BASED_OUTPATIENT_CLINIC_OR_DEPARTMENT_OTHER): Payer: 59

## 2015-10-25 DIAGNOSIS — D0511 Intraductal carcinoma in situ of right breast: Secondary | ICD-10-CM | POA: Diagnosis not present

## 2015-10-25 DIAGNOSIS — Z85118 Personal history of other malignant neoplasm of bronchus and lung: Secondary | ICD-10-CM | POA: Diagnosis not present

## 2015-10-25 DIAGNOSIS — C3412 Malignant neoplasm of upper lobe, left bronchus or lung: Secondary | ICD-10-CM

## 2015-10-25 LAB — CBC WITH DIFFERENTIAL/PLATELET
BASO%: 0.9 % (ref 0.0–2.0)
Basophils Absolute: 0 10*3/uL (ref 0.0–0.1)
EOS ABS: 0.4 10*3/uL (ref 0.0–0.5)
EOS%: 6.9 % (ref 0.0–7.0)
HEMATOCRIT: 39.5 % (ref 34.8–46.6)
HEMOGLOBIN: 13 g/dL (ref 11.6–15.9)
LYMPH#: 1.8 10*3/uL (ref 0.9–3.3)
LYMPH%: 32.7 % (ref 14.0–49.7)
MCH: 30.3 pg (ref 25.1–34.0)
MCHC: 32.9 g/dL (ref 31.5–36.0)
MCV: 92.2 fL (ref 79.5–101.0)
MONO#: 0.5 10*3/uL (ref 0.1–0.9)
MONO%: 8.6 % (ref 0.0–14.0)
NEUT%: 50.9 % (ref 38.4–76.8)
NEUTROS ABS: 2.7 10*3/uL (ref 1.5–6.5)
PLATELETS: 284 10*3/uL (ref 145–400)
RBC: 4.28 10*6/uL (ref 3.70–5.45)
RDW: 13.8 % (ref 11.2–14.5)
WBC: 5.4 10*3/uL (ref 3.9–10.3)

## 2015-10-25 LAB — COMPREHENSIVE METABOLIC PANEL
ALBUMIN: 3.3 g/dL — AB (ref 3.5–5.0)
ALK PHOS: 49 U/L (ref 40–150)
ALT: 13 U/L (ref 0–55)
AST: 16 U/L (ref 5–34)
Anion Gap: 8 mEq/L (ref 3–11)
BILIRUBIN TOTAL: 0.35 mg/dL (ref 0.20–1.20)
BUN: 15.5 mg/dL (ref 7.0–26.0)
CO2: 25 mEq/L (ref 22–29)
Calcium: 8.9 mg/dL (ref 8.4–10.4)
Chloride: 108 mEq/L (ref 98–109)
Creatinine: 0.8 mg/dL (ref 0.6–1.1)
EGFR: 82 mL/min/{1.73_m2} — AB (ref >90)
GLUCOSE: 98 mg/dL (ref 70–140)
Potassium: 4 mEq/L (ref 3.5–5.1)
SODIUM: 141 meq/L (ref 136–145)
TOTAL PROTEIN: 6.4 g/dL (ref 6.4–8.3)

## 2015-10-26 LAB — CANCER ANTIGEN 27.29: CA 27.29: 27.1 U/mL (ref 0.0–38.6)

## 2015-10-26 LAB — CANCER ANTIGEN 27-29 (PARALLEL TESTING): CA 27.29: 30 U/mL (ref <38)

## 2015-11-01 ENCOUNTER — Telehealth: Payer: Self-pay | Admitting: Internal Medicine

## 2015-11-01 ENCOUNTER — Ambulatory Visit (HOSPITAL_BASED_OUTPATIENT_CLINIC_OR_DEPARTMENT_OTHER): Payer: 59 | Admitting: Internal Medicine

## 2015-11-01 ENCOUNTER — Encounter: Payer: Self-pay | Admitting: Internal Medicine

## 2015-11-01 VITALS — BP 134/62 | HR 64 | Temp 98.3°F | Resp 18 | Ht 64.0 in | Wt 155.3 lb

## 2015-11-01 DIAGNOSIS — D0511 Intraductal carcinoma in situ of right breast: Secondary | ICD-10-CM

## 2015-11-01 DIAGNOSIS — Z85118 Personal history of other malignant neoplasm of bronchus and lung: Secondary | ICD-10-CM | POA: Diagnosis not present

## 2015-11-01 DIAGNOSIS — C3412 Malignant neoplasm of upper lobe, left bronchus or lung: Secondary | ICD-10-CM

## 2015-11-01 NOTE — Progress Notes (Signed)
[place] Telephone:(336) [phone]   Fax:(336) [phone]  OFFICE PROGRESS NOTE  [name], MD [place] [address]  DIAGNOSIS:  1) stage IIA non-small cell lung cancer diagnosed in March of 2006 2) recurrent ductal carcinoma in situ of the right breast initially diagnosed in January of 2014 with recurrence in October of 2014.  PRIOR THERAPY: 1) status post left trisegmentectomy with node dissection on 06/24/2004 2) status post 4 cycles of adjuvant chemotherapy with carboplatin and paclitaxel last dose was given 10/24/2004. 3) status post right partial mastectomy on 04/10/2012 under the care of Dr. Brantley Stage. 4) status post adjuvant radiotherapy to the right breast under the care of Dr. Valere Dross  CURRENT THERAPY: Tamoxifen 20 mg by mouth daily started in May of 2014.  INTERVAL HISTORY: [name] 65 y.o. female returns to the clinic today for six-month followup visit. The patient is feeling fine today with no specific complaints. She has no significant change since her last visit. She is tolerating her treatment with tamoxifen fairly well. Her last mammogram was unremarkable for any malignancy. She has no chest pain, shortness of breath, cough or hemoptysis. She denied having any significant nausea or vomiting, no significant weight loss or night sweats. She had repeat bloodwork performed recently and she is here for evaluation and discussion of her lab results.  MEDICAL HISTORY: Past Medical History:  Diagnosis Date  . Anxiety   . DDD (degenerative disc disease)    neck  . Eczema   . GERD (gastroesophageal reflux disease)    occasional - no current med.  Marland Kitchen History of breast cancer   . History of lung cancer    non-small cell - left upper lobe  . Osteoarthritis   . PONV (postoperative nausea and vomiting)   . Radiation 07/03/12-07/09/12   Right upper lung 5400 cGy  . Sinus headache    occasional  . Thyroid nodule, toxic  or with hyperthyroidism    no current med.  . Urinary, incontinence, stress female     ALLERGIES:  is allergic to amoxicillin; asa [aspirin]; banana; ceftin [cefuroxime axetil]; lactose intolerance (gi); orange fruit [citrus]; penicillins; and adhesive [tape].  MEDICATIONS:  Current Outpatient Prescriptions  Medication Sig Dispense Refill  . acetaminophen (TYLENOL) 500 MG tablet Take 1,000 mg by mouth every 6 (six) hours as needed (arthritis).    . cycloSPORINE (RESTASIS) 0.05 % ophthalmic emulsion 1 drop 2 (two) times daily.    . fluocinonide gel (LIDEX) 0.05 %     . fluticasone (FLONASE) 50 MCG/ACT nasal spray Place 1 spray into both nostrils daily.    Marland Kitchen ibuprofen (ADVIL,MOTRIN) 600 MG tablet Take 600 mg by mouth 2 (two) times daily.  0  . Multiple Vitamin (MULTIVITAMIN) capsule Take 1 capsule by mouth.    Marland Kitchen PATANOL 0.1 % ophthalmic solution Place 1 drop into both eyes as needed for allergies. Reported on 04/28/2015    . Polyethyl Glycol-Propyl Glycol (SYSTANE OP) Apply 1 drop to eye as needed (dry eyes). Reported on 04/28/2015    . tamoxifen (NOLVADEX) 20 MG tablet TAKE 1 TABLET (20 MG TOTAL) BY MOUTH DAILY. 30 tablet 5  . vitamin E 400 UNIT capsule Take 400 Units by mouth.     No current facility-administered medications for this visit.     SURGICAL HISTORY:  Past Surgical History:  Procedure Laterality Date  . APPENDECTOMY  1971  . BREAST RECONSTRUCTION WITH PLACEMENT OF TISSUE EXPANDER  AND FLEX HD (ACELLULAR HYDRATED DERMIS) Right 05/20/2013   Procedure: IMMEDIATE RIGHT BREAST RECONSTRUCTION WITH LATISSAMUS MUSCLE FLAP AND PLACEMENT OF TISSUE EXPANDER TO RIGHT BREAST;  Surgeon: Theodoro Kos, DO;  Location: [facility];  Service: Plastics;  Laterality: Right;  . BREAST REDUCTION WITH MASTOPEXY Left 10/22/2013   Procedure: BREAST REDUCTION/MASTOPEXY TO LEFT BREAST WITH LIPOSUCTION/FAT GRAFTING FOR SYMMETRY;  Surgeon: Theodoro Kos, DO;  Location: [facility];  Service: Plastics;   Laterality: Left;  . DE QUERVAIN'S RELEASE Left 07/31/2002  . MASTECTOMY W/ SENTINEL NODE BIOPSY Right 05/20/2013   Procedure: RIGHT SIMPLEMASTECTOMY AND SENTINEL NODE   Sampling;  Surgeon: Joyice Faster. Cornett, MD;  Location: [facility];  Service: General;  Laterality: Right;  . MOUTH SURGERY Right may 2014   "keratocystic odotogenic tumor" - X 2  . NASAL SINUS SURGERY  ?1988-1990   "at least 3" (05/21/2013)  . PARTIAL MASTECTOMY WITH NEEDLE LOCALIZATION  04/10/2012   Procedure: PARTIAL MASTECTOMY WITH NEEDLE LOCALIZATION;  Surgeon: Marcello Moores A. Cornett, MD;  Location: [facility];  Service: General;  Laterality: Right;  . REMOVAL OF TISSUE EXPANDER AND PLACEMENT OF IMPLANT Right 10/22/2013   Procedure: REMOVAL OF RIGHT TISSUE EXPANDER WITH PLACEMENT OF RIGHT BREAST IMPLANT;  Surgeon: Theodoro Kos, DO;  Location: [facility];  Service: Plastics;  Laterality: Right;  . THORACOTOMY Left 07/14/2004  . TRIGGER FINGER RELEASE Bilateral    "I've had OR on q finger" (05/21/2013)  . TRIGGER FINGER RELEASE Right 07/14/1999   index and little fingers  . TRIGGER FINGER RELEASE Right 07/17/2002   long and ring fingers  . TRIGGER FINGER RELEASE Left 07/31/2002   little, ring and index fingers  . VIDEO ASSISTED THORACOSCOPY (VATS)/ LOBECTOMY Left 07/14/2004   upper tri-segmentectomy  . WISDOM TOOTH EXTRACTION     "3"    REVIEW OF SYSTEMS:  A comprehensive review of systems was negative.   PHYSICAL EXAMINATION: General appearance: alert, cooperative and no distress Head: Normocephalic, without obvious abnormality, atraumatic Neck: no adenopathy Lymph nodes: Cervical, supraclavicular, and axillary nodes normal. Resp: clear to auscultation bilaterally Cardio: regular rate and rhythm, S1, S2 normal, no murmur, click, rub or gallop GI: soft, non-tender; bowel sounds normal; no masses,  no organomegaly Extremities: extremities normal, atraumatic, no cyanosis or edema Neurologic: Alert and  oriented X 3, normal strength and tone. Normal symmetric reflexes. Normal coordination and gait  ECOG PERFORMANCE STATUS: 0 - Asymptomatic  Blood pressure 134/62, pulse 64, temperature 98.3 F (36.8 C), temperature source Oral, resp. rate 18, height '5\' 4"'$  (1.626 m), weight 155 lb 4.8 oz (70.4 kg), SpO2 100 %.  LABORATORY DATA: Lab Results  Component Value Date   WBC 5.4 10/25/2015   HGB 13.0 10/25/2015   HCT 39.5 10/25/2015   MCV 92.2 10/25/2015   PLT 284 10/25/2015      Chemistry      Component Value Date/Time   NA 141 10/25/2015 0754   K 4.0 10/25/2015 0754   CL 102 05/20/2013 1830   CL 108 (H) 04/28/2012 1008   CO2 25 10/25/2015 0754   BUN 15.5 10/25/2015 0754   CREATININE 0.8 10/25/2015 0754      Component Value Date/Time   CALCIUM 8.9 10/25/2015 0754   ALKPHOS 49 10/25/2015 0754   AST 16 10/25/2015 0754   ALT 13 10/25/2015 0754   BILITOT 0.35 10/25/2015 0754       RADIOGRAPHIC STUDIES: No results found. ASSESSMENT AND PLAN: This is a very pleasant 65  years old white female with history of stage IIA non-small cell lung cancer as well as recurrent right breast ductal carcinoma in situ. She status post right lumpectomy followed by adjuvant radiotherapy. The patient is doing fine and no evidence for disease progression. Her lab work is unremarkable today. I discussed the lab result with the patient. I recommended for her to continue her current treatment with tamoxifen. I would see her back for follow-up visit in 6 months with repeat blood work and chest x-ray. She was advised to call immediately if she has any concerning symptoms in the interval. All questions were answered. The patient knows to call the clinic with any problems, questions or concerns. We can certainly see the patient much sooner if necessary. Disclaimer: This note was dictated with voice recognition software. Similar sounding words can inadvertently be transcribed and may be missed upon review.

## 2015-11-01 NOTE — Telephone Encounter (Signed)
Gave pt cal & avs °

## 2016-01-25 ENCOUNTER — Other Ambulatory Visit: Payer: Self-pay | Admitting: Internal Medicine

## 2016-05-01 ENCOUNTER — Other Ambulatory Visit (HOSPITAL_BASED_OUTPATIENT_CLINIC_OR_DEPARTMENT_OTHER): Payer: 59

## 2016-05-01 ENCOUNTER — Ambulatory Visit (HOSPITAL_COMMUNITY)
Admission: RE | Admit: 2016-05-01 | Discharge: 2016-05-01 | Disposition: A | Discharge status: Home / Self Care | Payer: 59 | Source: Ambulatory Visit | Attending: Internal Medicine | Admitting: Internal Medicine

## 2016-05-01 DIAGNOSIS — D0511 Intraductal carcinoma in situ of right breast: Secondary | ICD-10-CM

## 2016-05-01 DIAGNOSIS — C3412 Malignant neoplasm of upper lobe, left bronchus or lung: Secondary | ICD-10-CM | POA: Insufficient documentation

## 2016-05-01 DIAGNOSIS — J984 Other disorders of lung: Secondary | ICD-10-CM | POA: Insufficient documentation

## 2016-05-01 LAB — CBC WITH DIFFERENTIAL/PLATELET
BASO%: 0.5 % (ref 0.0–2.0)
Basophils Absolute: 0 10*3/uL (ref 0.0–0.1)
EOS%: 7.5 % — AB (ref 0.0–7.0)
Eosinophils Absolute: 0.4 10*3/uL (ref 0.0–0.5)
HEMATOCRIT: 39.2 % (ref 34.8–46.6)
HGB: 13.1 g/dL (ref 11.6–15.9)
LYMPH#: 1.7 10*3/uL (ref 0.9–3.3)
LYMPH%: 30.1 % (ref 14.0–49.7)
MCH: 30.5 pg (ref 25.1–34.0)
MCHC: 33.4 g/dL (ref 31.5–36.0)
MCV: 91.3 fL (ref 79.5–101.0)
MONO#: 0.6 10*3/uL (ref 0.1–0.9)
MONO%: 10.4 % (ref 0.0–14.0)
NEUT#: 3 10*3/uL (ref 1.5–6.5)
NEUT%: 51.5 % (ref 38.4–76.8)
Platelets: 306 10*3/uL (ref 145–400)
RBC: 4.3 10*6/uL (ref 3.70–5.45)
RDW: 13.4 % (ref 11.2–14.5)
WBC: 5.7 10*3/uL (ref 3.9–10.3)

## 2016-05-01 LAB — COMPREHENSIVE METABOLIC PANEL
ALT: 13 U/L (ref 0–55)
AST: 16 U/L (ref 5–34)
Albumin: 3.4 g/dL — ABNORMAL LOW (ref 3.5–5.0)
Alkaline Phosphatase: 53 U/L (ref 40–150)
Anion Gap: 9 mEq/L (ref 3–11)
BUN: 14.5 mg/dL (ref 7.0–26.0)
CALCIUM: 9.6 mg/dL (ref 8.4–10.4)
CHLORIDE: 108 meq/L (ref 98–109)
CO2: 25 meq/L (ref 22–29)
CREATININE: 0.8 mg/dL (ref 0.6–1.1)
EGFR: 84 mL/min/{1.73_m2} — ABNORMAL LOW (ref >90)
GLUCOSE: 106 mg/dL (ref 70–140)
Potassium: 4.3 mEq/L (ref 3.5–5.1)
Sodium: 142 mEq/L (ref 136–145)
Total Bilirubin: 0.3 mg/dL (ref 0.20–1.20)
Total Protein: 6.6 g/dL (ref 6.4–8.3)

## 2016-05-01 IMAGING — CR DG CHEST 1V
1 series · 1 of 1 positions shown · non-contrast
Comparison: Chest x-ray of [DATE] and CT chest of [DATE]

CLINICAL DATA: History of left lung carcinoma 12 years ago with
partial left upper lobectomy at that time, followup, former smoking
history

EXAM:
CHEST 1 VIEW

[w chest pa]
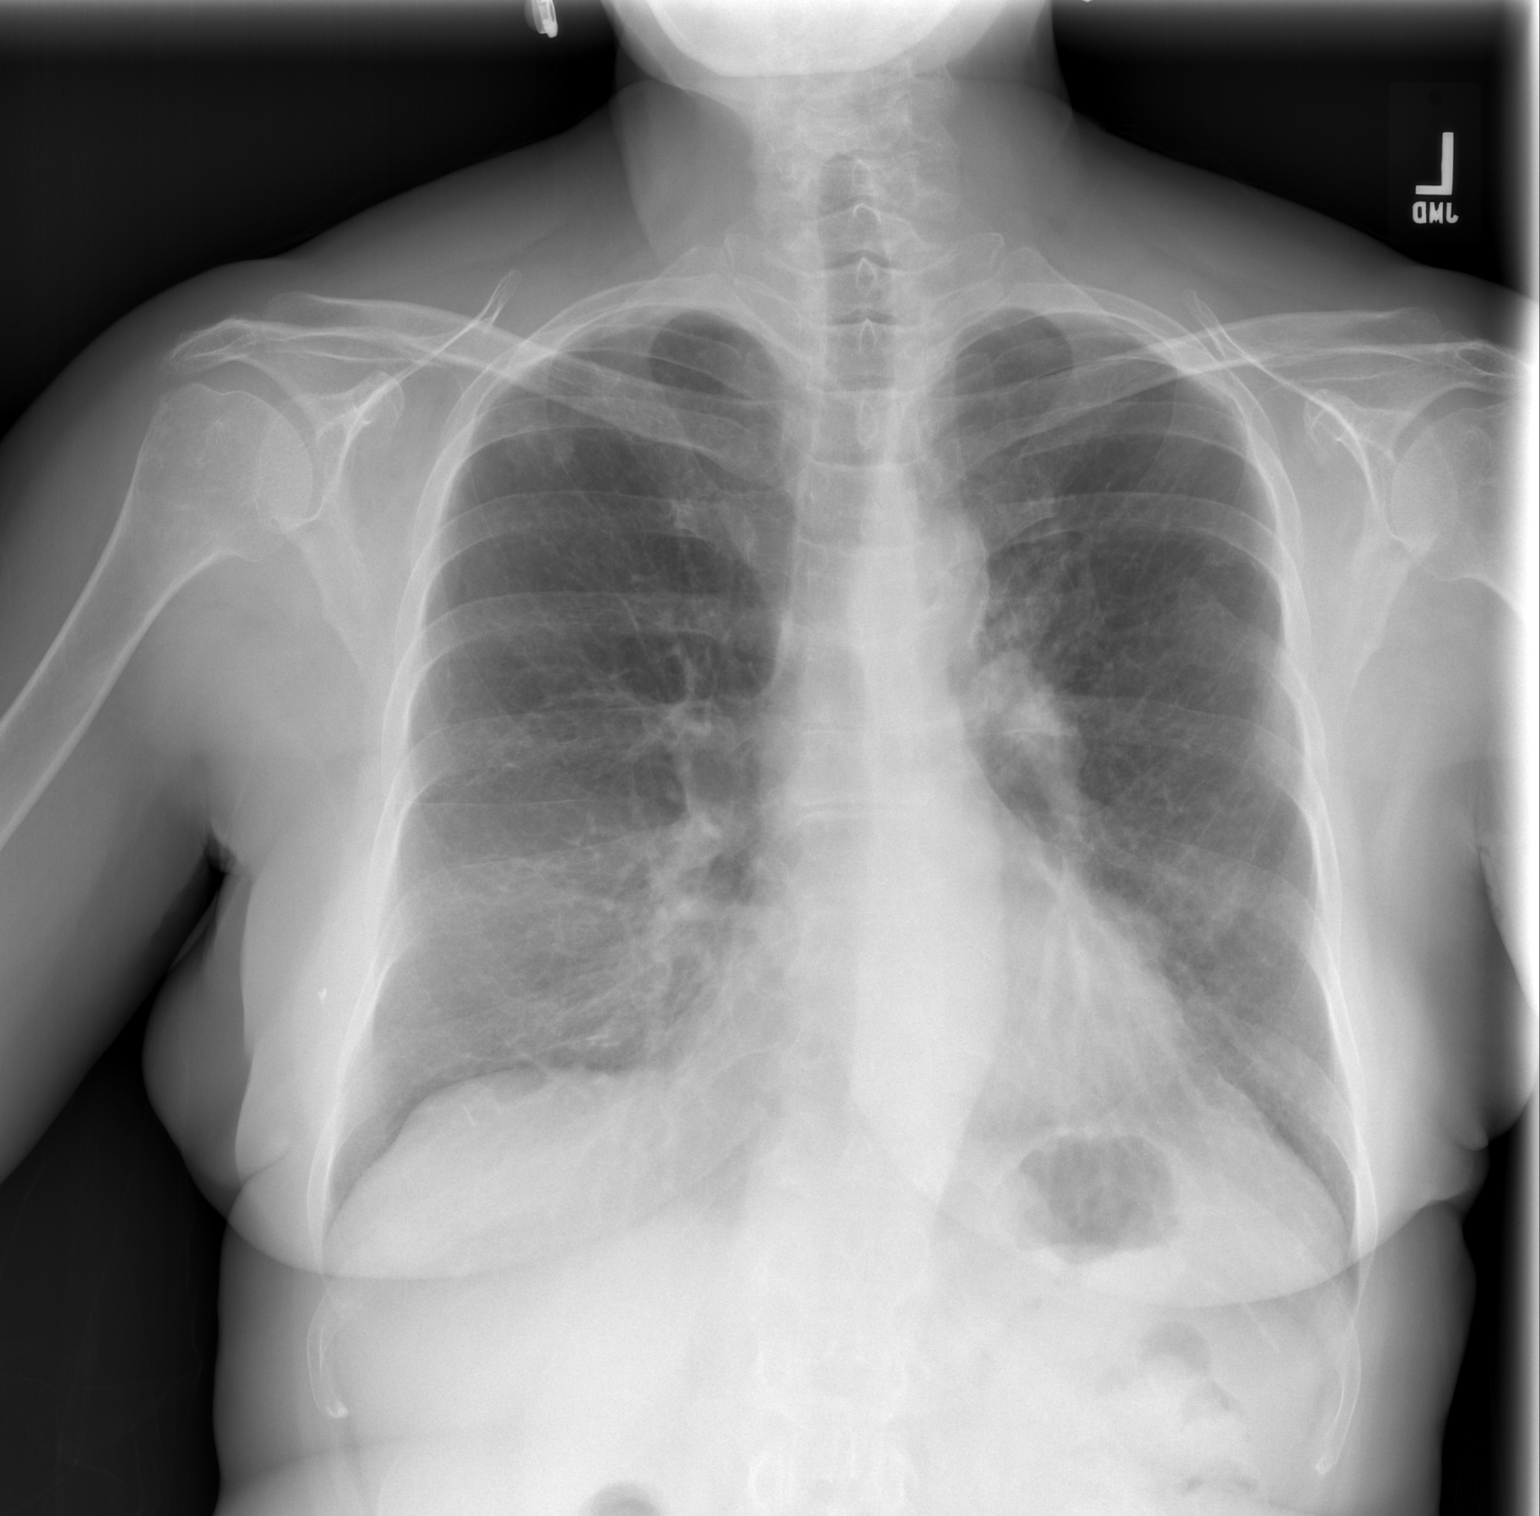

[1 of 1 positions shown; findings below may reference images not displayed]

FINDINGS: The nodularity question previously within the right upper lobe near
the apex appears represent pleural-parenchymal scarring when
compared to the prior CT the chest and is stable. No evidence of
lung carcinoma is currently seen. Prior resection of the left
posterior sixth rib is again noted. Mediastinal and hilar contours
are unchanged. The heart is within normal limits in size. No acute
bony abnormality is seen.
IMPRESSION: Stable chest x-ray with scarring in the right upper lobe. No
definite active process.

## 2016-05-02 LAB — CANCER ANTIGEN 27.29: CA 27.29: 32.4 U/mL (ref 0.0–38.6)

## 2016-05-08 ENCOUNTER — Ambulatory Visit (HOSPITAL_BASED_OUTPATIENT_CLINIC_OR_DEPARTMENT_OTHER): Payer: 59 | Admitting: Internal Medicine

## 2016-05-08 ENCOUNTER — Telehealth: Payer: Self-pay | Admitting: Internal Medicine

## 2016-05-08 ENCOUNTER — Encounter: Payer: Self-pay | Admitting: Internal Medicine

## 2016-05-08 VITALS — BP 145/77 | HR 84 | Temp 98.4°F | Resp 17 | Ht 64.0 in | Wt 155.9 lb

## 2016-05-08 DIAGNOSIS — D0511 Intraductal carcinoma in situ of right breast: Secondary | ICD-10-CM

## 2016-05-08 DIAGNOSIS — Z85118 Personal history of other malignant neoplasm of bronchus and lung: Secondary | ICD-10-CM

## 2016-05-08 DIAGNOSIS — C3412 Malignant neoplasm of upper lobe, left bronchus or lung: Secondary | ICD-10-CM

## 2016-05-08 NOTE — Telephone Encounter (Signed)
Appointments scheduled per 05/08/16 los. Patient was given a copy of the AVS report and appointment schedule, per 05/08/16 los.

## 2016-05-08 NOTE — Progress Notes (Signed)
[place] [phone]   [phone]  OFFICE PROGRESS NOTE  [name], MD [place] [id number]  DIAGNOSIS:  1) stage IIA non-small cell lung cancer diagnosed in March of 2006 2) recurrent ductal carcinoma in situ of the right breast initially diagnosed in January of 2014 with recurrence in October of 2014.  PRIOR THERAPY: 1) status post left trisegmentectomy with node dissection on 06/24/2004 2) status post 4 cycles of adjuvant chemotherapy with carboplatin and paclitaxel last dose was given 10/24/2004. 3) status post right partial mastectomy on 04/10/2012 under the care of Dr. Brantley Stage. 4) status post adjuvant radiotherapy to the right breast under the care of Dr. Valere Dross  CURRENT THERAPY: Tamoxifen 20 mg by mouth daily started in May of 2014.  INTERVAL HISTORY: [name] 66 y.o. female came to the clinic today for follow-up visit. The patient is feeling fine with no specific complaints except for indigestion and bloating with excess intestinal gas. She is also worried about some enlargement in her thyroid gland. She is followed by Dr. Kenton Kingfisher her primary care physician for these issues. She had a mammogram in July 2017 that was unremarkable. She denied having any significant weight loss or night sweats. She has no nausea, vomiting, diarrhea or constipation. She denied having any chest pain, shortness of breath, cough or hemoptysis. She is here today for evaluation and repeat blood work.  MEDICAL HISTORY: Past Medical History:  Diagnosis Date  . Anxiety   . DDD (degenerative disc disease)    neck  . Eczema   . GERD (gastroesophageal reflux disease)    occasional - no current med.  Marland Kitchen History of breast cancer   . History of lung cancer    non-small cell - left upper lobe  . Osteoarthritis   . PONV (postoperative nausea and vomiting)   . Radiation 07/03/12-07/09/12   Right upper lung 5400 cGy  . Sinus  headache    occasional  . Thyroid nodule, toxic or with hyperthyroidism    no current med.  . Urinary, incontinence, stress female     ALLERGIES:  is allergic to amoxicillin; asa [aspirin]; banana; ceftin [cefuroxime axetil]; lactose intolerance (gi); orange fruit [citrus]; penicillins; and adhesive [tape].  MEDICATIONS:  Current Outpatient Prescriptions  Medication Sig Dispense Refill  . acetaminophen (TYLENOL) 500 MG tablet Take 1,000 mg by mouth every 6 (six) hours as needed (arthritis).    . cycloSPORINE (RESTASIS) 0.05 % ophthalmic emulsion 1 drop 2 (two) times daily.    . fluocinonide gel (LIDEX) 0.05 %     . fluticasone (FLONASE) 50 MCG/ACT nasal spray Place 1 spray into both nostrils daily.    Marland Kitchen ibuprofen (ADVIL,MOTRIN) 600 MG tablet Take 600 mg by mouth 2 (two) times daily.  0  . Multiple Vitamin (MULTIVITAMIN) capsule Take 1 capsule by mouth.    Marland Kitchen PATANOL 0.1 % ophthalmic solution Place 1 drop into both eyes as needed for allergies. Reported on 04/28/2015    . Polyethyl Glycol-Propyl Glycol (SYSTANE OP) Apply 1 drop to eye as needed (dry eyes). Reported on 04/28/2015    . tamoxifen (NOLVADEX) 20 MG tablet TAKE 1 TABLET (20 MG TOTAL) BY MOUTH DAILY. 30 tablet 5  . vitamin E 400 UNIT capsule Take 400 Units by mouth.     No current facility-administered medications for this visit.     SURGICAL HISTORY:  Past Surgical History:  Procedure Laterality Date  . APPENDECTOMY  1971  . BREAST RECONSTRUCTION WITH PLACEMENT OF TISSUE EXPANDER AND FLEX HD (ACELLULAR HYDRATED DERMIS) Right 05/20/2013   Procedure: IMMEDIATE RIGHT BREAST RECONSTRUCTION WITH LATISSAMUS MUSCLE FLAP AND PLACEMENT OF TISSUE EXPANDER TO RIGHT BREAST;  Surgeon: Theodoro Kos, DO;  Location: [facility];  Service: Plastics;  Laterality: Right;  . BREAST REDUCTION WITH MASTOPEXY Left 10/22/2013   Procedure: BREAST REDUCTION/MASTOPEXY TO LEFT BREAST WITH LIPOSUCTION/FAT GRAFTING FOR SYMMETRY;  Surgeon: Theodoro Kos, DO;   Location: [facility];  Service: Plastics;  Laterality: Left;  . DE QUERVAIN'S RELEASE Left 07/31/2002  . MASTECTOMY W/ SENTINEL NODE BIOPSY Right 05/20/2013   Procedure: RIGHT SIMPLEMASTECTOMY AND SENTINEL NODE   Sampling;  Surgeon: Joyice Faster. Cornett, MD;  Location: [facility];  Service: General;  Laterality: Right;  . MOUTH SURGERY Right may 2014   "keratocystic odotogenic tumor" - X 2  . NASAL SINUS SURGERY  ?1988-1990   "at least 3" (05/21/2013)  . PARTIAL MASTECTOMY WITH NEEDLE LOCALIZATION  04/10/2012   Procedure: PARTIAL MASTECTOMY WITH NEEDLE LOCALIZATION;  Surgeon: Marcello Moores A. Cornett, MD;  Location: [facility];  Service: General;  Laterality: Right;  . REMOVAL OF TISSUE EXPANDER AND PLACEMENT OF IMPLANT Right 10/22/2013   Procedure: REMOVAL OF RIGHT TISSUE EXPANDER WITH PLACEMENT OF RIGHT BREAST IMPLANT;  Surgeon: Theodoro Kos, DO;  Location: [facility];  Service: Plastics;  Laterality: Right;  . THORACOTOMY Left 07/14/2004  . TRIGGER FINGER RELEASE Bilateral    "I've had OR on q finger" (05/21/2013)  . TRIGGER FINGER RELEASE Right 07/14/1999   index and little fingers  . TRIGGER FINGER RELEASE Right 07/17/2002   long and ring fingers  . TRIGGER FINGER RELEASE Left 07/31/2002   little, ring and index fingers  . VIDEO ASSISTED THORACOSCOPY (VATS)/ LOBECTOMY Left 07/14/2004   upper tri-segmentectomy  . WISDOM TOOTH EXTRACTION     "3"    REVIEW OF SYSTEMS:  A comprehensive review of systems was negative except for: Gastrointestinal: positive for dyspepsia   PHYSICAL EXAMINATION: General appearance: alert, cooperative and no distress Head: Normocephalic, without obvious abnormality, atraumatic Neck: no adenopathy Lymph nodes: Cervical, supraclavicular, and axillary nodes normal. Resp: clear to auscultation bilaterally Back: symmetric, no curvature. ROM normal. No CVA tenderness. Cardio: regular rate and rhythm, S1, S2 normal, no murmur, click, rub or  gallop GI: soft, non-tender; bowel sounds normal; no masses,  no organomegaly Extremities: extremities normal, atraumatic, no cyanosis or edema  ECOG PERFORMANCE STATUS: 0 - Asymptomatic  Blood pressure (!) 145/77, pulse 84, temperature 98.4 F (36.9 C), temperature source Oral, resp. rate 17, height '5\' 4"'$  (1.626 m), weight 155 lb 14.4 oz (70.7 kg), SpO2 100 %.  LABORATORY DATA: Lab Results  Component Value Date   WBC 5.7 05/01/2016   HGB 13.1 05/01/2016   HCT 39.2 05/01/2016   MCV 91.3 05/01/2016   PLT 306 05/01/2016      Chemistry      Component Value Date/Time   NA 142 05/01/2016 0743   K 4.3 05/01/2016 0743   CL 102 05/20/2013 1830   CL 108 (H) 04/28/2012 1008   CO2 25 05/01/2016 0743   BUN 14.5 05/01/2016 0743   CREATININE 0.8 05/01/2016 0743      Component Value Date/Time   CALCIUM 9.6 05/01/2016 0743   ALKPHOS 53 05/01/2016 0743   AST 16 05/01/2016 0743   ALT 13 05/01/2016 0743   BILITOT 0.30 05/01/2016 0743       RADIOGRAPHIC STUDIES: Dg Chest 1  View  Result Date: 05/01/2016 CLINICAL DATA:  History of left lung carcinoma 12 years ago with partial left upper lobectomy at that time, followup, former smoking history EXAM: CHEST 1 VIEW COMPARISON:  Chest x-ray of 04/21/2015 and CT chest of 04/20/2014 FINDINGS: The nodularity question previously within the right upper lobe near the apex appears represent pleural-parenchymal scarring when compared to the prior CT the chest and is stable. No evidence of lung carcinoma is currently seen. Prior resection of the left posterior sixth rib is again noted. Mediastinal and hilar contours are unchanged. The heart is within normal limits in size. No acute bony abnormality is seen. IMPRESSION: Stable chest x-ray with scarring in the right upper lobe. No definite active process. Electronically Signed   By: Ivar Drape M.D.   On: 05/01/2016 08:32   ASSESSMENT AND PLAN:  This is a very pleasant 66 years old white female with stage II  a non-small cell lung cancer as well as recurrent breast ductal carcinoma in situ. The patient is status post right lumpectomy followed by adjuvant radiotherapy. She is currently on treatment with tamoxifen and tolerating it fairly well. Her recent blood work and chest x-ray are unremarkable for any disease progression. I discussed the lab and the imaging results with the patient today. I recommended for her to continue on observation with repeat blood work in 6 months. She will also have a mammogram in July 2018. For the indigestion and enlargement of her thyroid, she was advised to consult with Dr. Kenton Kingfisher for further evaluation. She was advised to call immediately if she has any concerning symptoms in the interval. All questions were answered. The patient knows to call the clinic with any problems, questions or concerns. We can certainly see the patient much sooner if necessary. I spent 10 minutes counseling the patient face to face. The total time spent in the appointment was 15 minutes.  Disclaimer: This note was dictated with voice recognition software. Similar sounding words can inadvertently be transcribed and may be missed upon review.

## 2016-05-11 ENCOUNTER — Other Ambulatory Visit: Payer: Self-pay | Admitting: Family Medicine

## 2016-05-11 DIAGNOSIS — R1011 Right upper quadrant pain: Secondary | ICD-10-CM

## 2016-05-17 ENCOUNTER — Ambulatory Visit
Admission: RE | Admit: 2016-05-17 | Discharge: 2016-05-17 | Disposition: A | Discharge status: Home / Self Care | Payer: 59 | Source: Ambulatory Visit | Attending: Family Medicine | Admitting: Family Medicine

## 2016-05-17 DIAGNOSIS — R1011 Right upper quadrant pain: Secondary | ICD-10-CM

## 2016-05-24 ENCOUNTER — Other Ambulatory Visit (HOSPITAL_COMMUNITY): Payer: Self-pay | Admitting: Family Medicine

## 2016-05-24 DIAGNOSIS — R1011 Right upper quadrant pain: Secondary | ICD-10-CM

## 2016-06-01 ENCOUNTER — Ambulatory Visit (HOSPITAL_COMMUNITY)
Admission: RE | Admit: 2016-06-01 | Discharge: 2016-06-01 | Disposition: A | Discharge status: Home / Self Care | Payer: 59 | Source: Ambulatory Visit | Attending: Family Medicine | Admitting: Family Medicine

## 2016-06-01 DIAGNOSIS — R1011 Right upper quadrant pain: Secondary | ICD-10-CM

## 2016-06-01 IMAGING — NM NM HEPATO W/GB/PHARM/[PERSON_NAME]
3 series · 13 of 13 positions shown · non-contrast
Comparison: Ultrasound [DATE]

CLINICAL DATA: The patient has RUQ tenderness and discomfort
,bloating ,belching and gas x 6 weeks/ neg ultrasound in PACs/ 8 oz
ENSURE PLUS po given for gallbladder stimulation with no pain post
consumption.

EXAM:
NUCLEAR MEDICINE HEPATOBILIARY IMAGING WITH GALLBLADDER EF
TECHNIQUE: Sequential images of the abdomen were obtained [DATE] minutes
following intravenous administration of radiopharmaceutical. After
oral ingestion of Ensure, gallbladder ejection fraction was
determined. At 60 min, normal ejection fraction is greater than 33%.
RADIOPHARMACEUTICALS:  5.0 mCi [G6]  Choletec IV

[Series 1: biliary · 4.14mm/px · 6 of 60 frames shown]
[frame 6/60]
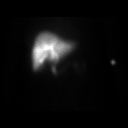
[frame 16/60]
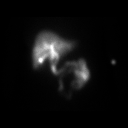
[frame 26/60]
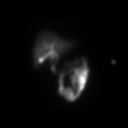
[frame 36/60]
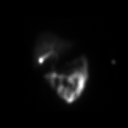
[frame 46/60]
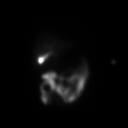
[frame 56/60]
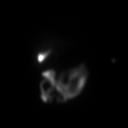

[Series 2: rt lat · 4.14mm/px · 1 of 1 slices shown]
[im 1/1]
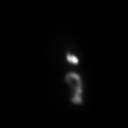

[Series 3: gbef · 4.14mm/px · 6 of 60 frames shown]
[frame 6/60]
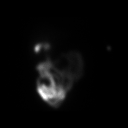
[frame 16/60]
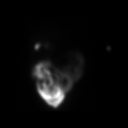
[frame 26/60]
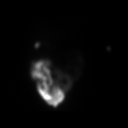
[frame 36/60]
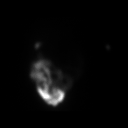
[frame 46/60]
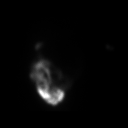
[frame 56/60]
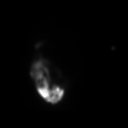

[13 of 13 positions shown; findings below may reference images not displayed]

FINDINGS: Prompt uptake and biliary excretion of activity by the liver is
seen. Gallbladder activity is visualized, consistent with patency of
cystic duct. Biliary activity passes into small bowel, consistent
with patent common bile duct.

Calculated gallbladder ejection fraction is 93%. (Normal gallbladder
ejection fraction with Ensure is greater than 33%.)
IMPRESSION: 1. Normal visualization of the gallbladder.
2. Normal ejection fraction.

## 2016-06-01 MED ORDER — TECHNETIUM TC 99M MEBROFENIN IV KIT
5.0000 | PACK | Freq: Once | INTRAVENOUS | Status: AC | PRN
  Administered 2016-06-01: 5 via INTRAVENOUS

## 2016-07-19 ENCOUNTER — Other Ambulatory Visit: Payer: Self-pay | Admitting: Medical Oncology

## 2016-07-19 DIAGNOSIS — D0511 Intraductal carcinoma in situ of right breast: Secondary | ICD-10-CM

## 2016-07-19 MED ORDER — TAMOXIFEN CITRATE 20 MG PO TABS: ORAL_TABLET | ORAL | 5 refills | Status: DC

## 2016-09-05 ENCOUNTER — Other Ambulatory Visit: Payer: Self-pay | Admitting: Family Medicine

## 2016-09-05 DIAGNOSIS — Z1231 Encounter for screening mammogram for malignant neoplasm of breast: Secondary | ICD-10-CM

## 2016-09-20 ENCOUNTER — Ambulatory Visit
Admission: RE | Admit: 2016-09-20 | Discharge: 2016-09-20 | Disposition: A | Discharge status: Home / Self Care | Payer: 59 | Source: Ambulatory Visit | Attending: Family Medicine | Admitting: Family Medicine

## 2016-09-20 DIAGNOSIS — Z1231 Encounter for screening mammogram for malignant neoplasm of breast: Secondary | ICD-10-CM

## 2016-09-20 HISTORY — DX: Personal history of irradiation: Z92.3

## 2016-10-29 ENCOUNTER — Other Ambulatory Visit (HOSPITAL_BASED_OUTPATIENT_CLINIC_OR_DEPARTMENT_OTHER): Payer: Medicare Other

## 2016-10-29 DIAGNOSIS — C50919 Malignant neoplasm of unspecified site of unspecified female breast: Secondary | ICD-10-CM | POA: Diagnosis not present

## 2016-10-29 DIAGNOSIS — D0511 Intraductal carcinoma in situ of right breast: Secondary | ICD-10-CM

## 2016-10-29 DIAGNOSIS — C3412 Malignant neoplasm of upper lobe, left bronchus or lung: Secondary | ICD-10-CM

## 2016-10-29 LAB — CBC WITH DIFFERENTIAL/PLATELET
BASO%: 0.6 % (ref 0.0–2.0)
Basophils Absolute: 0 10*3/uL (ref 0.0–0.1)
EOS%: 8.9 % — AB (ref 0.0–7.0)
Eosinophils Absolute: 0.5 10*3/uL (ref 0.0–0.5)
HCT: 40 % (ref 34.8–46.6)
HGB: 13.3 g/dL (ref 11.6–15.9)
LYMPH#: 1.9 10*3/uL (ref 0.9–3.3)
LYMPH%: 35 % (ref 14.0–49.7)
MCH: 30.6 pg (ref 25.1–34.0)
MCHC: 33.2 g/dL (ref 31.5–36.0)
MCV: 92.1 fL (ref 79.5–101.0)
MONO#: 0.5 10*3/uL (ref 0.1–0.9)
MONO%: 9.9 % (ref 0.0–14.0)
NEUT%: 45.6 % (ref 38.4–76.8)
NEUTROS ABS: 2.4 10*3/uL (ref 1.5–6.5)
PLATELETS: 302 10*3/uL (ref 145–400)
RBC: 4.34 10*6/uL (ref 3.70–5.45)
RDW: 13.8 % (ref 11.2–14.5)
WBC: 5.4 10*3/uL (ref 3.9–10.3)

## 2016-10-29 LAB — COMPREHENSIVE METABOLIC PANEL
ALT: 12 U/L (ref 0–55)
ANION GAP: 8 meq/L (ref 3–11)
AST: 15 U/L (ref 5–34)
Albumin: 3.4 g/dL — ABNORMAL LOW (ref 3.5–5.0)
Alkaline Phosphatase: 49 U/L (ref 40–150)
BILIRUBIN TOTAL: 0.32 mg/dL (ref 0.20–1.20)
BUN: 21.1 mg/dL (ref 7.0–26.0)
CO2: 26 meq/L (ref 22–29)
CREATININE: 0.9 mg/dL (ref 0.6–1.1)
Calcium: 9 mg/dL (ref 8.4–10.4)
Chloride: 107 mEq/L (ref 98–109)
EGFR: 67 mL/min/{1.73_m2} — ABNORMAL LOW (ref >90)
GLUCOSE: 95 mg/dL (ref 70–140)
Potassium: 4.1 mEq/L (ref 3.5–5.1)
Sodium: 141 mEq/L (ref 136–145)
TOTAL PROTEIN: 6.6 g/dL (ref 6.4–8.3)

## 2016-10-30 LAB — CANCER ANTIGEN 27.29: CAN 27.29: 31.6 U/mL (ref 0.0–38.6)

## 2016-11-05 ENCOUNTER — Other Ambulatory Visit: Payer: 59

## 2016-11-05 ENCOUNTER — Encounter: Payer: Self-pay | Admitting: Internal Medicine

## 2016-11-05 ENCOUNTER — Telehealth: Payer: Self-pay | Admitting: Internal Medicine

## 2016-11-05 ENCOUNTER — Ambulatory Visit (HOSPITAL_BASED_OUTPATIENT_CLINIC_OR_DEPARTMENT_OTHER): Payer: Medicare Other | Admitting: Internal Medicine

## 2016-11-05 VITALS — BP 150/86 | HR 70 | Temp 97.9°F | Resp 20 | Ht 64.0 in | Wt 154.8 lb

## 2016-11-05 DIAGNOSIS — E049 Nontoxic goiter, unspecified: Secondary | ICD-10-CM | POA: Diagnosis not present

## 2016-11-05 DIAGNOSIS — C50011 Malignant neoplasm of nipple and areola, right female breast: Secondary | ICD-10-CM

## 2016-11-05 DIAGNOSIS — D0511 Intraductal carcinoma in situ of right breast: Secondary | ICD-10-CM

## 2016-11-05 DIAGNOSIS — Z85118 Personal history of other malignant neoplasm of bronchus and lung: Secondary | ICD-10-CM | POA: Diagnosis not present

## 2016-11-05 DIAGNOSIS — C3412 Malignant neoplasm of upper lobe, left bronchus or lung: Secondary | ICD-10-CM

## 2016-11-05 DIAGNOSIS — K30 Functional dyspepsia: Secondary | ICD-10-CM

## 2016-11-05 NOTE — Telephone Encounter (Signed)
Scheduled appt per 8/13 los - Gave patient AVS and calender per los. Lab and f/u in 6 months.

## 2016-11-05 NOTE — Progress Notes (Signed)
[place] Telephone:([phone]   Fax:([phone]) [phone]  OFFICE PROGRESS NOTE  [name], MD [place] [address]  DIAGNOSIS:  1) stage IIA non-small cell lung cancer diagnosed in March of 2006 2) recurrent ductal carcinoma in situ of the right breast initially diagnosed in January of 2014 with recurrence in October of 2014.  PRIOR THERAPY: 1) status post left trisegmentectomy with node dissection on 06/24/2004 2) status post 4 cycles of adjuvant chemotherapy with carboplatin and paclitaxel last dose was given 10/24/2004. 3) status post right partial mastectomy on 04/10/2012 under the care of Dr. Brantley Stage. 4) status post adjuvant radiotherapy to the right breast under the care of Dr. Valere Dross  CURRENT THERAPY: Tamoxifen 20 mg by mouth daily started in May of 2014.  INTERVAL HISTORY: [name] 66 y.o. female came to the clinic today for follow-up visit. The patient is feeling fine with no specific complaints except for indigestion and bloating with excess intestinal gas. She is also worried about some enlargement in her thyroid gland. She is followed by Dr. Kenton Kingfisher her primary care physician for these issues. She had a mammogram in July 2017 that was unremarkable. She denied having any significant weight loss or night sweats. She has no nausea, vomiting, diarrhea or constipation. She denied having any chest pain, shortness of breath, cough or hemoptysis. She is here today for evaluation and repeat blood work.  MEDICAL HISTORY: Past Medical History:  Diagnosis Date  . Anxiety   . Breast cancer ([place])    2014 and 2015  . Cancer ([place])    lung cancer   . DDD (degenerative disc disease)    neck  . Eczema   . GERD (gastroesophageal reflux disease)    occasional - no current med.  Marland Kitchen History of breast cancer   . History of lung cancer    non-small cell - left upper lobe  . Osteoarthritis   . Personal history of radiation  therapy 2014  . PONV (postoperative nausea and vomiting)   . Radiation 07/03/12-07/09/12   Right upper lung 5400 cGy  . Sinus headache    occasional  . Thyroid nodule, toxic or with hyperthyroidism    no current med.  . Urinary, incontinence, stress female     ALLERGIES:  is allergic to amoxicillin; asa [aspirin]; banana; ceftin [cefuroxime axetil]; lactose intolerance (gi); orange fruit [citrus]; penicillins; and adhesive [tape].  MEDICATIONS:  Current Outpatient Prescriptions  Medication Sig Dispense Refill  . acetaminophen (TYLENOL) 500 MG tablet Take 1,000 mg by mouth every 6 (six) hours as needed (arthritis).    . cycloSPORINE (RESTASIS) 0.05 % ophthalmic emulsion 1 drop 2 (two) times daily.    . fluocinonide gel (LIDEX) 0.05 %     . fluticasone (FLONASE) 50 MCG/ACT nasal spray Place 1 spray into both nostrils daily.    Marland Kitchen ibuprofen (ADVIL,MOTRIN) 600 MG tablet Take 600 mg by mouth 2 (two) times daily.  0  . Multiple Vitamin (MULTIVITAMIN) capsule Take 1 capsule by mouth.    Marland Kitchen PATANOL 0.1 % ophthalmic solution Place 1 drop into both eyes as needed for allergies. Reported on 04/28/2015    . Polyethyl Glycol-Propyl Glycol (SYSTANE OP) Apply 1 drop to eye as needed (dry eyes). Reported on 04/28/2015    . tamoxifen (NOLVADEX) 20 MG tablet TAKE 1 TABLET (20 MG TOTAL) BY MOUTH DAILY. 30 tablet 5  . vitamin E 400 UNIT capsule Take 400 Units by  mouth.     No current facility-administered medications for this visit.     SURGICAL HISTORY:  Past Surgical History:  Procedure Laterality Date  . APPENDECTOMY  1971  . BREAST LUMPECTOMY Right 2014  . BREAST RECONSTRUCTION WITH PLACEMENT OF TISSUE EXPANDER AND FLEX HD (ACELLULAR HYDRATED DERMIS) Right 05/20/2013   Procedure: IMMEDIATE RIGHT BREAST RECONSTRUCTION WITH LATISSAMUS MUSCLE FLAP AND PLACEMENT OF TISSUE EXPANDER TO RIGHT BREAST;  Surgeon: Theodoro Kos, DO;  Location: [facility];  Service: Plastics;  Laterality: Right;  . BREAST REDUCTION WITH  MASTOPEXY Left 10/22/2013   Procedure: BREAST REDUCTION/MASTOPEXY TO LEFT BREAST WITH LIPOSUCTION/FAT GRAFTING FOR SYMMETRY;  Surgeon: Theodoro Kos, DO;  Location: [facility];  Service: Plastics;  Laterality: Left;  . DE QUERVAIN'S RELEASE Left 07/31/2002  . MASTECTOMY Right    2015  . MASTECTOMY W/ SENTINEL NODE BIOPSY Right 05/20/2013   Procedure: RIGHT SIMPLEMASTECTOMY AND SENTINEL NODE   Sampling;  Surgeon: Joyice Faster. Cornett, MD;  Location: [facility];  Service: General;  Laterality: Right;  . MOUTH SURGERY Right may 2014   "keratocystic odotogenic tumor" - X 2  . NASAL SINUS SURGERY  ?1988-1990   "at least 3" (05/21/2013)  . PARTIAL MASTECTOMY WITH NEEDLE LOCALIZATION  04/10/2012   Procedure: PARTIAL MASTECTOMY WITH NEEDLE LOCALIZATION;  Surgeon: Marcello Moores A. Cornett, MD;  Location: [facility];  Service: General;  Laterality: Right;  . REDUCTION MAMMAPLASTY Left 2015  . REMOVAL OF TISSUE EXPANDER AND PLACEMENT OF IMPLANT Right 10/22/2013   Procedure: REMOVAL OF RIGHT TISSUE EXPANDER WITH PLACEMENT OF RIGHT BREAST IMPLANT;  Surgeon: Theodoro Kos, DO;  Location: [facility];  Service: Plastics;  Laterality: Right;  . THORACOTOMY Left 07/14/2004  . TRIGGER FINGER RELEASE Bilateral    "I've had OR on q finger" (05/21/2013)  . TRIGGER FINGER RELEASE Right 07/14/1999   index and little fingers  . TRIGGER FINGER RELEASE Right 07/17/2002   long and ring fingers  . TRIGGER FINGER RELEASE Left 07/31/2002   little, ring and index fingers  . VIDEO ASSISTED THORACOSCOPY (VATS)/ LOBECTOMY Left 07/14/2004   upper tri-segmentectomy  . WISDOM TOOTH EXTRACTION     "3"    REVIEW OF SYSTEMS:  A comprehensive review of systems was negative except for: Gastrointestinal: positive for dyspepsia   PHYSICAL EXAMINATION: General appearance: alert, cooperative and no distress Head: Normocephalic, without obvious abnormality, atraumatic Neck: no adenopathy Lymph nodes: Cervical,  supraclavicular, and axillary nodes normal. Resp: clear to auscultation bilaterally Back: symmetric, no curvature. ROM normal. No CVA tenderness. Cardio: regular rate and rhythm, S1, S2 normal, no murmur, click, rub or gallop GI: soft, non-tender; bowel sounds normal; no masses,  no organomegaly Extremities: extremities normal, atraumatic, no cyanosis or edema  ECOG PERFORMANCE STATUS: 0 - Asymptomatic  There were no vitals taken for this visit.  LABORATORY DATA: Lab Results  Component Value Date   WBC 5.4 10/29/2016   HGB 13.3 10/29/2016   HCT 40.0 10/29/2016   MCV 92.1 10/29/2016   PLT 302 10/29/2016      Chemistry      Component Value Date/Time   NA 141 10/29/2016 0742   K 4.1 10/29/2016 0742   CL 102 05/20/2013 1830   CL 108 (H) 04/28/2012 1008   CO2 26 10/29/2016 0742   BUN 21.1 10/29/2016 0742   CREATININE 0.9 10/29/2016 0742      Component Value Date/Time   CALCIUM 9.0 10/29/2016 0742   ALKPHOS 49 10/29/2016 0742   AST  15 10/29/2016 0742   ALT 12 10/29/2016 0742   BILITOT 0.32 10/29/2016 0742       RADIOGRAPHIC STUDIES: No results found. ASSESSMENT AND PLAN:  This is a very pleasant 66 years old white female with stage II a non-small cell lung cancer as well as recurrent breast ductal carcinoma in situ. The patient is status post right lumpectomy followed by adjuvant radiotherapy. She is currently on treatment with tamoxifen and tolerating it fairly well. Her recent blood work and chest x-ray are unremarkable for any disease progression. I discussed the lab and the imaging results with the patient today. I recommended for her to continue on observation with repeat blood work in 6 months. She will also have a mammogram in July 2018. For the indigestion and enlargement of her thyroid, she was advised to consult with Dr. Kenton Kingfisher for further evaluation. She was advised to call immediately if she has any concerning symptoms in the interval. All questions were  answered. The patient knows to call the clinic with any problems, questions or concerns. We can certainly see the patient much sooner if necessary. I spent 10 minutes counseling the patient face to face. The total time spent in the appointment was 15 minutes.  Disclaimer: This note was dictated with voice recognition software. Similar sounding words can inadvertently be transcribed and may be missed upon review.           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  PRIOR THERAPY: 1) status post left trisegmentectomy with node dissection on 06/24/2004 2) status post 4 cycles of adjuvant chemotherapy with carboplatin and paclitaxel last dose was given 10/24/2004. 3) status post right partial mastectomy on 04/10/2012 under the care of Dr. Brantley Stage. 4) status post adjuvant radiotherapy to the right breast under the care of Dr. Valere Dross  CURRENT THERAPY: Tamoxifen 20 mg by mouth daily started in May of 2014.  INTERVAL HISTORY: [name] 66 y.o. female returns to the clinic today for follow-up visit. The patient is feeling fine today with no specific complaints. She denied having any chest pain, shortness breath, cough or hemoptysis. She denied having any fever or chills. She has no nausea, vomiting, diarrhea or constipation. She is tolerating her current treatment with tamoxifen fairly well. She denied having any weight loss or night sweats. She had repeat blood work as well as MRI of the breast performed recently and she is here for evaluation and discussion of her lab results.  MEDICAL HISTORY: Past Medical History:  Diagnosis Date  . Anxiety   .  Breast cancer ([place])    2014 and 2015  . Cancer ([place])    lung cancer   . DDD (degenerative disc disease)    neck  . Eczema   . GERD (gastroesophageal reflux disease)    occasional - no current med.  Marland Kitchen History of breast cancer   . History of lung cancer    non-small cell - left upper lobe  . Osteoarthritis   . Personal history of radiation therapy 2014  . PONV (postoperative nausea and vomiting)   . Radiation 07/03/12-07/09/12   Right upper lung 5400 cGy  . Sinus headache  occasional  . Thyroid nodule, toxic or with hyperthyroidism    no current med.  . Urinary, incontinence, stress female     ALLERGIES:  is allergic to amoxicillin; asa [aspirin]; banana; ceftin [cefuroxime axetil]; lactose intolerance (gi); orange fruit [citrus]; penicillins; and adhesive [tape].  MEDICATIONS:  Current Outpatient Prescriptions  Medication Sig Dispense Refill  . acetaminophen (TYLENOL) 500 MG tablet Take 1,000 mg by mouth every 6 (six) hours as needed (arthritis).    . cycloSPORINE (RESTASIS) 0.05 % ophthalmic emulsion 1 drop 2 (two) times daily.    . fluocinonide gel (LIDEX) 0.05 %     . fluticasone (FLONASE) 50 MCG/ACT nasal spray Place 1 spray into both nostrils daily.    Marland Kitchen ibuprofen (ADVIL,MOTRIN) 600 MG tablet Take 600 mg by mouth 2 (two) times daily.  0  . Multiple Vitamin (MULTIVITAMIN) capsule Take 1 capsule by mouth.    Marland Kitchen PATANOL 0.1 % ophthalmic solution Place 1 drop into both eyes as needed for allergies. Reported on 04/28/2015    . Polyethyl Glycol-Propyl Glycol (SYSTANE OP) Apply 1 drop to eye as needed (dry eyes). Reported on 04/28/2015    . ranitidine (ZANTAC) 150 MG tablet Take 150 mg by mouth 2 (two) times daily as needed.    . tamoxifen (NOLVADEX) 20 MG tablet TAKE 1 TABLET (20 MG TOTAL) BY MOUTH DAILY. 30 tablet 5  . vitamin E 400 UNIT capsule Take 400 Units by mouth.     No current facility-administered medications for this visit.     SURGICAL HISTORY:  Past Surgical  History:  Procedure Laterality Date  . APPENDECTOMY  1971  . BREAST LUMPECTOMY Right 2014  . BREAST RECONSTRUCTION WITH PLACEMENT OF TISSUE EXPANDER AND FLEX HD (ACELLULAR HYDRATED DERMIS) Right 05/20/2013   Procedure: IMMEDIATE RIGHT BREAST RECONSTRUCTION WITH LATISSAMUS MUSCLE FLAP AND PLACEMENT OF TISSUE EXPANDER TO RIGHT BREAST;  Surgeon: Theodoro Kos, DO;  Location: [facility];  Service: Plastics;  Laterality: Right;  . BREAST REDUCTION WITH MASTOPEXY Left 10/22/2013   Procedure: BREAST REDUCTION/MASTOPEXY TO LEFT BREAST WITH LIPOSUCTION/FAT GRAFTING FOR SYMMETRY;  Surgeon: Theodoro Kos, DO;  Location: [facility];  Service: Plastics;  Laterality: Left;  . DE QUERVAIN'S RELEASE Left 07/31/2002  . MASTECTOMY Right    2015  . MASTECTOMY W/ SENTINEL NODE BIOPSY Right 05/20/2013   Procedure: RIGHT SIMPLEMASTECTOMY AND SENTINEL NODE   Sampling;  Surgeon: Joyice Faster. Cornett, MD;  Location: [facility];  Service: General;  Laterality: Right;  . MOUTH SURGERY Right may 2014   "keratocystic odotogenic tumor" - X 2  . NASAL SINUS SURGERY  ?1988-1990   "at least 3" (05/21/2013)  . PARTIAL MASTECTOMY WITH NEEDLE LOCALIZATION  04/10/2012   Procedure: PARTIAL MASTECTOMY WITH NEEDLE LOCALIZATION;  Surgeon: Marcello Moores A. Cornett, MD;  Location: [facility];  Service: General;  Laterality: Right;  . REDUCTION MAMMAPLASTY Left 2015  . REMOVAL OF TISSUE EXPANDER AND PLACEMENT OF IMPLANT Right 10/22/2013   Procedure: REMOVAL OF RIGHT TISSUE EXPANDER WITH PLACEMENT OF RIGHT BREAST IMPLANT;  Surgeon: Theodoro Kos, DO;  Location: [facility];  Service: Plastics;  Laterality: Right;  . THORACOTOMY Left 07/14/2004  . TRIGGER FINGER RELEASE Bilateral    "I've had OR on q finger" (05/21/2013)  . TRIGGER FINGER RELEASE Right 07/14/1999   index and little fingers  . TRIGGER FINGER RELEASE Right 07/17/2002   long and ring fingers  . TRIGGER FINGER RELEASE Left 07/31/2002   little, ring and index  fingers  .  VIDEO ASSISTED THORACOSCOPY (VATS)/ LOBECTOMY Left 07/14/2004   upper tri-segmentectomy  . WISDOM TOOTH EXTRACTION     "3"    REVIEW OF SYSTEMS:  A comprehensive review of systems was negative.   PHYSICAL EXAMINATION: General appearance: alert, cooperative and no distress Head: Normocephalic, without obvious abnormality, atraumatic Neck: no adenopathy Lymph nodes: Cervical, supraclavicular, and axillary nodes normal. Resp: clear to auscultation bilaterally Back: symmetric, no curvature. ROM normal. No CVA tenderness. Cardio: regular rate and rhythm, S1, S2 normal, no murmur, click, rub or gallop GI: soft, non-tender; bowel sounds normal; no masses,  no organomegaly Extremities: extremities normal, atraumatic, no cyanosis or edema  ECOG PERFORMANCE STATUS: 0 - Asymptomatic  Blood pressure (!) 150/86, pulse 70, temperature 97.9 F (36.6 C), temperature source Oral, resp. rate 20, height 5\' 4"  (1.626 m), weight 154 lb 12.8 oz (70.2 kg), SpO2 100 %.  LABORATORY DATA: Lab Results  Component Value Date   WBC 5.4 10/29/2016   HGB 13.3 10/29/2016   HCT 40.0 10/29/2016   MCV 92.1 10/29/2016   PLT 302 10/29/2016      Chemistry      Component Value Date/Time   NA 141 10/29/2016 0742   K 4.1 10/29/2016 0742   CL 102 05/20/2013 1830   CL 108 (H) 04/28/2012 1008   CO2 26 10/29/2016 0742   BUN 21.1 10/29/2016 0742   CREATININE 0.9 10/29/2016 0742      Component Value Date/Time   CALCIUM 9.0 10/29/2016 0742   ALKPHOS 49 10/29/2016 0742   AST 15 10/29/2016 0742   ALT 12 10/29/2016 0742   BILITOT 0.32 10/29/2016 0742       RADIOGRAPHIC STUDIES: No results found. ASSESSMENT AND PLAN:  This is a very pleasant 66 years old white female with stage II a non-small cell lung cancer as well as recurrent breast ductal carcinoma in situ. The patient is status post right lumpectomy followed by adjuvant radiotherapy. She is currently on treatment with tamoxifen and tolerating  it fairly well. The patient had recent blood work that showed no clear evidence for progression. I discussed the lab result with the patient today and recommended for her to continue on observation with repeat blood work as well as chest x-ray in 6 months. She was advised to call immediately if she has any concerning symptoms in the interval. All questions were answered. The patient knows to call the clinic with any problems, questions or concerns. We can certainly see the patient much sooner if necessary. I spent 10 minutes counseling the patient face to face. The total time spent in the appointment was 15 minutes.  Disclaimer: This note was dictated with voice recognition software. Similar sounding words can inadvertently be transcribed and may be missed upon review.

## 2017-01-29 ENCOUNTER — Other Ambulatory Visit: Payer: Self-pay | Admitting: *Deleted

## 2017-01-29 DIAGNOSIS — D0511 Intraductal carcinoma in situ of right breast: Secondary | ICD-10-CM

## 2017-01-29 MED ORDER — TAMOXIFEN CITRATE 20 MG PO TABS: ORAL_TABLET | ORAL | 5 refills | Status: DC

## 2017-02-05 ENCOUNTER — Other Ambulatory Visit: Payer: Self-pay | Admitting: Medical Oncology

## 2017-02-05 DIAGNOSIS — D0511 Intraductal carcinoma in situ of right breast: Secondary | ICD-10-CM

## 2017-02-05 MED ORDER — TAMOXIFEN CITRATE 20 MG PO TABS: ORAL_TABLET | ORAL | 5 refills | Status: DC

## 2017-03-26 HISTORY — PX: FRACTURE SURGERY: SHX138

## 2017-03-27 DIAGNOSIS — N3020 Other chronic cystitis without hematuria: Secondary | ICD-10-CM | POA: Diagnosis not present

## 2017-03-28 DIAGNOSIS — M549 Dorsalgia, unspecified: Secondary | ICD-10-CM | POA: Diagnosis not present

## 2017-04-04 DIAGNOSIS — M79605 Pain in left leg: Secondary | ICD-10-CM | POA: Diagnosis not present

## 2017-04-04 DIAGNOSIS — S8992XA Unspecified injury of left lower leg, initial encounter: Secondary | ICD-10-CM | POA: Diagnosis not present

## 2017-04-04 DIAGNOSIS — M79662 Pain in left lower leg: Secondary | ICD-10-CM | POA: Diagnosis not present

## 2017-04-04 DIAGNOSIS — S86812D Strain of other muscle(s) and tendon(s) at lower leg level, left leg, subsequent encounter: Secondary | ICD-10-CM | POA: Diagnosis not present

## 2017-04-08 DIAGNOSIS — S86812D Strain of other muscle(s) and tendon(s) at lower leg level, left leg, subsequent encounter: Secondary | ICD-10-CM | POA: Diagnosis not present

## 2017-04-08 DIAGNOSIS — M79662 Pain in left lower leg: Secondary | ICD-10-CM | POA: Diagnosis not present

## 2017-04-10 DIAGNOSIS — S86812D Strain of other muscle(s) and tendon(s) at lower leg level, left leg, subsequent encounter: Secondary | ICD-10-CM | POA: Diagnosis not present

## 2017-04-10 DIAGNOSIS — M79662 Pain in left lower leg: Secondary | ICD-10-CM | POA: Diagnosis not present

## 2017-04-22 DIAGNOSIS — S86812D Strain of other muscle(s) and tendon(s) at lower leg level, left leg, subsequent encounter: Secondary | ICD-10-CM | POA: Diagnosis not present

## 2017-04-22 DIAGNOSIS — M79662 Pain in left lower leg: Secondary | ICD-10-CM | POA: Diagnosis not present

## 2017-04-25 DIAGNOSIS — S86812D Strain of other muscle(s) and tendon(s) at lower leg level, left leg, subsequent encounter: Secondary | ICD-10-CM | POA: Diagnosis not present

## 2017-04-25 DIAGNOSIS — M25562 Pain in left knee: Secondary | ICD-10-CM | POA: Diagnosis not present

## 2017-04-25 DIAGNOSIS — M79662 Pain in left lower leg: Secondary | ICD-10-CM | POA: Diagnosis not present

## 2017-04-25 DIAGNOSIS — M1712 Unilateral primary osteoarthritis, left knee: Secondary | ICD-10-CM | POA: Diagnosis not present

## 2017-04-30 ENCOUNTER — Ambulatory Visit (HOSPITAL_COMMUNITY)
Admission: RE | Admit: 2017-04-30 | Discharge: 2017-04-30 | Disposition: A | Discharge status: Home / Self Care | Payer: Medicare Other | Source: Ambulatory Visit | Attending: Internal Medicine | Admitting: Internal Medicine

## 2017-04-30 ENCOUNTER — Inpatient Hospital Stay: Payer: Medicare Other | Attending: Internal Medicine

## 2017-04-30 DIAGNOSIS — Z9011 Acquired absence of right breast and nipple: Secondary | ICD-10-CM | POA: Insufficient documentation

## 2017-04-30 DIAGNOSIS — D0511 Intraductal carcinoma in situ of right breast: Secondary | ICD-10-CM

## 2017-04-30 DIAGNOSIS — Z79899 Other long term (current) drug therapy: Secondary | ICD-10-CM | POA: Insufficient documentation

## 2017-04-30 DIAGNOSIS — Z85118 Personal history of other malignant neoplasm of bronchus and lung: Secondary | ICD-10-CM | POA: Diagnosis not present

## 2017-04-30 DIAGNOSIS — R140 Abdominal distension (gaseous): Secondary | ICD-10-CM | POA: Insufficient documentation

## 2017-04-30 DIAGNOSIS — M954 Acquired deformity of chest and rib: Secondary | ICD-10-CM | POA: Insufficient documentation

## 2017-04-30 DIAGNOSIS — K30 Functional dyspepsia: Secondary | ICD-10-CM | POA: Insufficient documentation

## 2017-04-30 DIAGNOSIS — C50011 Malignant neoplasm of nipple and areola, right female breast: Secondary | ICD-10-CM

## 2017-04-30 DIAGNOSIS — Z9049 Acquired absence of other specified parts of digestive tract: Secondary | ICD-10-CM | POA: Insufficient documentation

## 2017-04-30 DIAGNOSIS — K219 Gastro-esophageal reflux disease without esophagitis: Secondary | ICD-10-CM | POA: Diagnosis not present

## 2017-04-30 DIAGNOSIS — Z170 Estrogen receptor positive status [ER+]: Secondary | ICD-10-CM | POA: Insufficient documentation

## 2017-04-30 DIAGNOSIS — C3412 Malignant neoplasm of upper lobe, left bronchus or lung: Secondary | ICD-10-CM

## 2017-04-30 DIAGNOSIS — Z923 Personal history of irradiation: Secondary | ICD-10-CM | POA: Insufficient documentation

## 2017-04-30 DIAGNOSIS — Z79810 Long term (current) use of selective estrogen receptor modulators (SERMs): Secondary | ICD-10-CM | POA: Insufficient documentation

## 2017-04-30 DIAGNOSIS — F419 Anxiety disorder, unspecified: Secondary | ICD-10-CM | POA: Insufficient documentation

## 2017-04-30 DIAGNOSIS — M5030 Other cervical disc degeneration, unspecified cervical region: Secondary | ICD-10-CM | POA: Insufficient documentation

## 2017-04-30 DIAGNOSIS — M1990 Unspecified osteoarthritis, unspecified site: Secondary | ICD-10-CM | POA: Insufficient documentation

## 2017-04-30 DIAGNOSIS — Z9221 Personal history of antineoplastic chemotherapy: Secondary | ICD-10-CM | POA: Insufficient documentation

## 2017-04-30 DIAGNOSIS — J439 Emphysema, unspecified: Secondary | ICD-10-CM | POA: Diagnosis not present

## 2017-04-30 DIAGNOSIS — E049 Nontoxic goiter, unspecified: Secondary | ICD-10-CM | POA: Diagnosis not present

## 2017-04-30 LAB — CBC WITH DIFFERENTIAL/PLATELET
BASOS ABS: 0 10*3/uL (ref 0.0–0.1)
BASOS PCT: 1 %
EOS ABS: 0.6 10*3/uL — AB (ref 0.0–0.5)
Eosinophils Relative: 9 %
HEMATOCRIT: 38.9 % (ref 34.8–46.6)
HEMOGLOBIN: 13 g/dL (ref 11.6–15.9)
Lymphocytes Relative: 23 %
Lymphs Abs: 1.4 10*3/uL (ref 0.9–3.3)
MCH: 30.9 pg (ref 25.1–34.0)
MCHC: 33.4 g/dL (ref 31.5–36.0)
MCV: 92.3 fL (ref 79.5–101.0)
Monocytes Absolute: 0.5 10*3/uL (ref 0.1–0.9)
Monocytes Relative: 8 %
NEUTROS ABS: 3.6 10*3/uL (ref 1.5–6.5)
NEUTROS PCT: 59 %
Platelets: 340 10*3/uL (ref 145–400)
RBC: 4.21 MIL/uL (ref 3.70–5.45)
RDW: 13.2 % (ref 11.2–14.5)
WBC: 6.1 10*3/uL (ref 3.9–10.3)

## 2017-04-30 LAB — COMPREHENSIVE METABOLIC PANEL
ALK PHOS: 62 U/L (ref 40–150)
ALT: 11 U/L (ref 0–55)
ANION GAP: 9 (ref 3–11)
AST: 14 U/L (ref 5–34)
Albumin: 3.3 g/dL — ABNORMAL LOW (ref 3.5–5.0)
BUN: 18 mg/dL (ref 7–26)
CALCIUM: 9.2 mg/dL (ref 8.4–10.4)
CO2: 24 mmol/L (ref 22–29)
CREATININE: 0.84 mg/dL (ref 0.60–1.10)
Chloride: 107 mmol/L (ref 98–109)
GFR calc non Af Amer: >60 mL/min (ref >60)
Glucose, Bld: 90 mg/dL (ref 70–140)
Potassium: 4.3 mmol/L (ref 3.5–5.1)
Sodium: 140 mmol/L (ref 136–145)
Total Bilirubin: 0.3 mg/dL (ref 0.2–1.2)
Total Protein: 7.1 g/dL (ref 6.4–8.3)

## 2017-04-30 IMAGING — CR DG CHEST 1V
1 series · 1 of 1 positions shown · non-contrast
Comparison: [DATE]

CLINICAL DATA: Malignant neoplasm of the left upper lung.

EXAM:
CHEST 1 VIEW

[w chest pa]
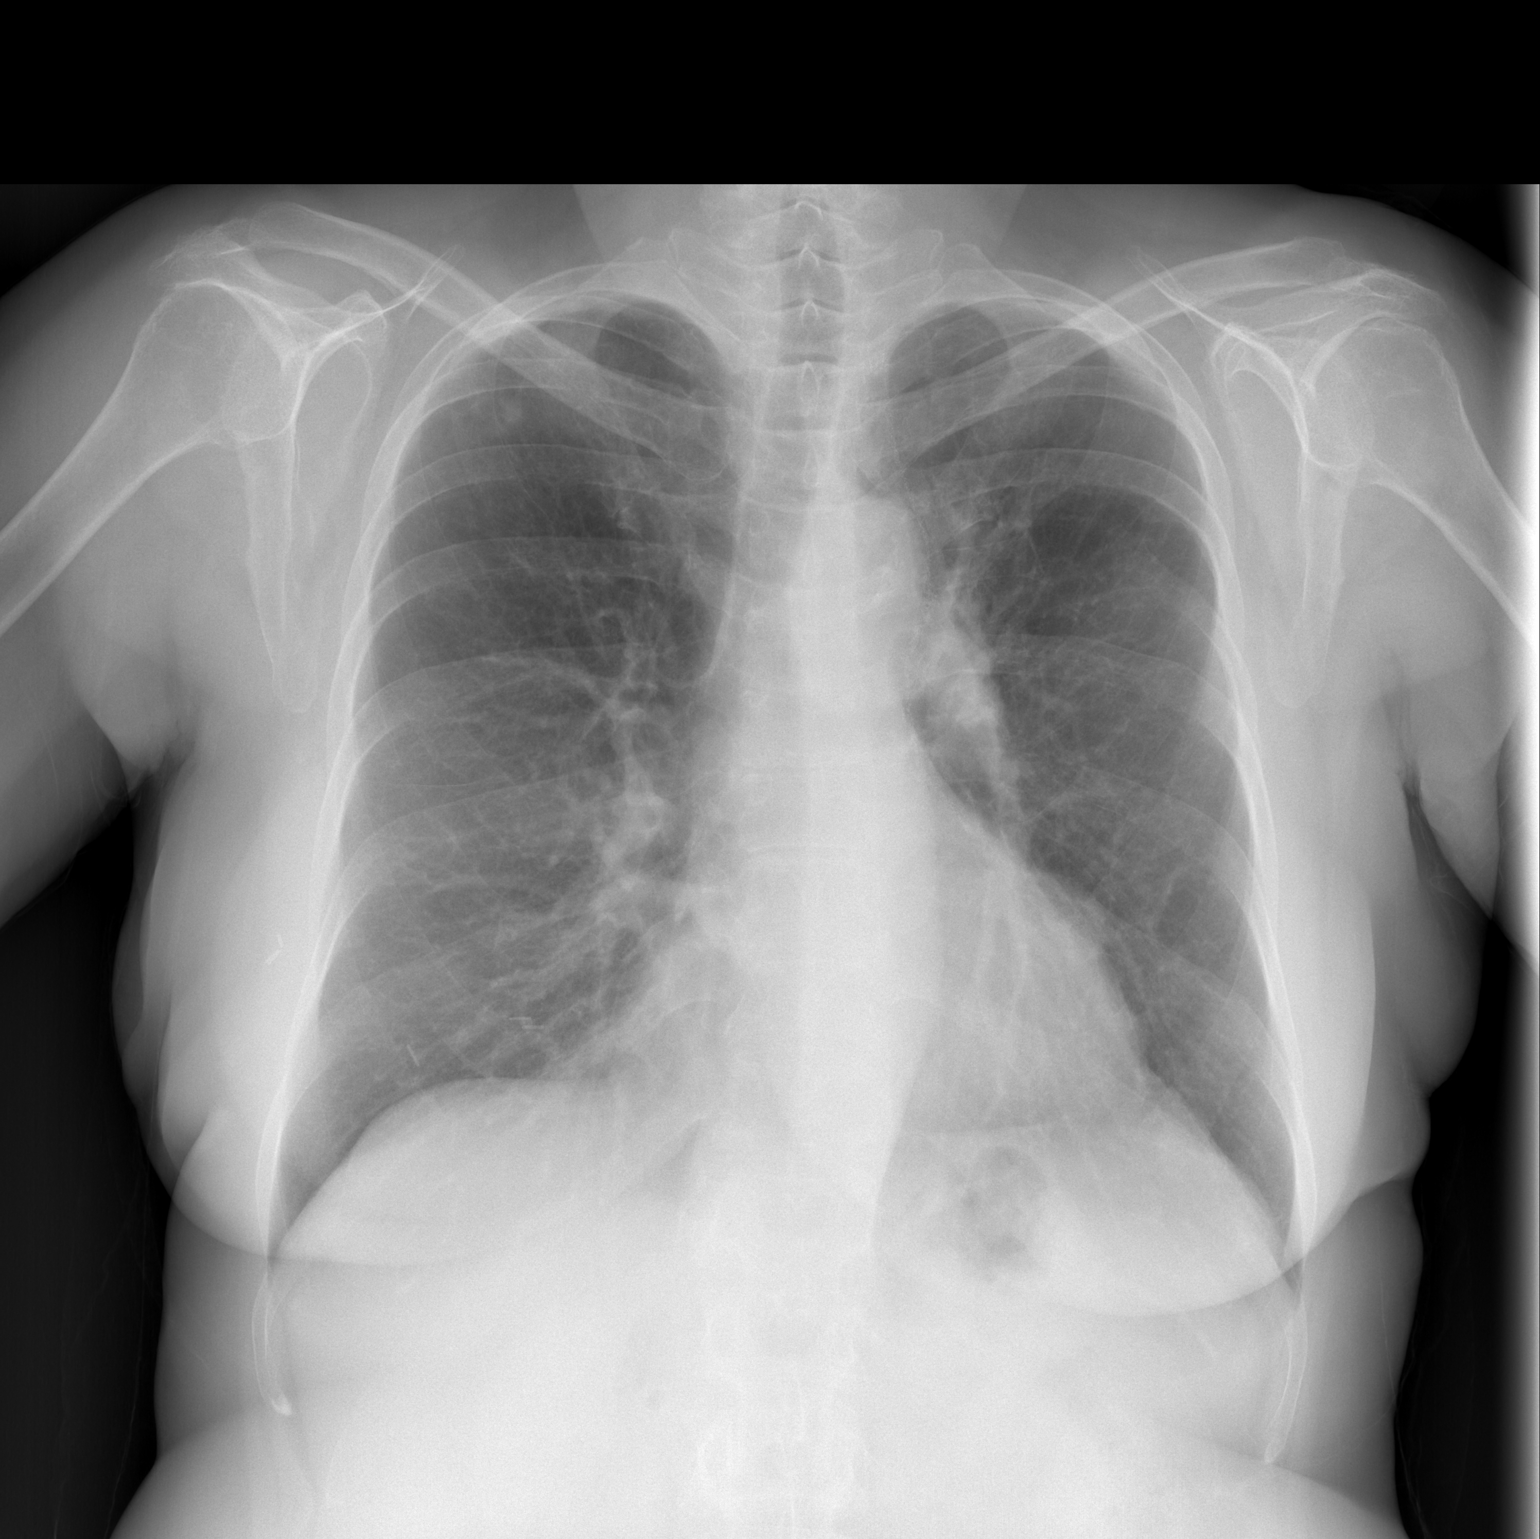

[1 of 1 positions shown; findings below may reference images not displayed]

FINDINGS: Heart size and mediastinal contours are within normal limits. Lungs
appear hyperinflated with chronic changes of emphysema. There is no
pleural effusion or edema. Nodular opacity within the right lung
apex is unchanged when compared with previous exam. Chronic left 6
rib deformity is identified.
IMPRESSION: 1. No active disease.
2. Emphysema.

## 2017-05-01 LAB — CANCER ANTIGEN 27.29: CA 27.29: 34.5 U/mL (ref 0.0–38.6)

## 2017-05-02 DIAGNOSIS — M79662 Pain in left lower leg: Secondary | ICD-10-CM | POA: Diagnosis not present

## 2017-05-02 DIAGNOSIS — S86812D Strain of other muscle(s) and tendon(s) at lower leg level, left leg, subsequent encounter: Secondary | ICD-10-CM | POA: Diagnosis not present

## 2017-05-07 ENCOUNTER — Inpatient Hospital Stay (HOSPITAL_BASED_OUTPATIENT_CLINIC_OR_DEPARTMENT_OTHER): Payer: Medicare Other | Admitting: Internal Medicine

## 2017-05-07 ENCOUNTER — Encounter: Payer: Self-pay | Admitting: Internal Medicine

## 2017-05-07 ENCOUNTER — Telehealth: Payer: Self-pay

## 2017-05-07 VITALS — BP 144/81 | HR 74 | Temp 98.1°F | Resp 18 | Ht 64.0 in | Wt 161.6 lb

## 2017-05-07 DIAGNOSIS — M5030 Other cervical disc degeneration, unspecified cervical region: Secondary | ICD-10-CM | POA: Diagnosis not present

## 2017-05-07 DIAGNOSIS — Z170 Estrogen receptor positive status [ER+]: Secondary | ICD-10-CM | POA: Diagnosis not present

## 2017-05-07 DIAGNOSIS — M954 Acquired deformity of chest and rib: Secondary | ICD-10-CM | POA: Diagnosis not present

## 2017-05-07 DIAGNOSIS — R140 Abdominal distension (gaseous): Secondary | ICD-10-CM | POA: Diagnosis not present

## 2017-05-07 DIAGNOSIS — J439 Emphysema, unspecified: Secondary | ICD-10-CM | POA: Diagnosis not present

## 2017-05-07 DIAGNOSIS — Z79810 Long term (current) use of selective estrogen receptor modulators (SERMs): Secondary | ICD-10-CM

## 2017-05-07 DIAGNOSIS — F419 Anxiety disorder, unspecified: Secondary | ICD-10-CM

## 2017-05-07 DIAGNOSIS — Z9221 Personal history of antineoplastic chemotherapy: Secondary | ICD-10-CM

## 2017-05-07 DIAGNOSIS — K219 Gastro-esophageal reflux disease without esophagitis: Secondary | ICD-10-CM

## 2017-05-07 DIAGNOSIS — Z85118 Personal history of other malignant neoplasm of bronchus and lung: Secondary | ICD-10-CM

## 2017-05-07 DIAGNOSIS — C50111 Malignant neoplasm of central portion of right female breast: Secondary | ICD-10-CM

## 2017-05-07 DIAGNOSIS — M1990 Unspecified osteoarthritis, unspecified site: Secondary | ICD-10-CM

## 2017-05-07 DIAGNOSIS — Z923 Personal history of irradiation: Secondary | ICD-10-CM

## 2017-05-07 DIAGNOSIS — Z79899 Other long term (current) drug therapy: Secondary | ICD-10-CM

## 2017-05-07 DIAGNOSIS — K30 Functional dyspepsia: Secondary | ICD-10-CM | POA: Diagnosis not present

## 2017-05-07 DIAGNOSIS — Z9011 Acquired absence of right breast and nipple: Secondary | ICD-10-CM

## 2017-05-07 DIAGNOSIS — D0511 Intraductal carcinoma in situ of right breast: Secondary | ICD-10-CM

## 2017-05-07 DIAGNOSIS — E049 Nontoxic goiter, unspecified: Secondary | ICD-10-CM

## 2017-05-07 DIAGNOSIS — Z9049 Acquired absence of other specified parts of digestive tract: Secondary | ICD-10-CM

## 2017-05-07 NOTE — Progress Notes (Signed)
[place] [phone]   Fax:(336) [phone]  OFFICE PROGRESS NOTE  [name] [id number]  DIAGNOSIS:  1) stage IIA non-small cell lung cancer diagnosed in March of 2006 2) recurrent ductal carcinoma in situ of the right breast initially diagnosed in January of 2014 with recurrence in October of 2014.  PRIOR THERAPY: 1) status post left trisegmentectomy with node dissection on 06/24/2004 2) status post 4 cycles of adjuvant chemotherapy with carboplatin and paclitaxel last dose was given 10/24/2004. 3) status post right partial mastectomy on 04/10/2012 under the care of Dr. Brantley Stage. 4) status post adjuvant radiotherapy to the right breast under the care of Dr. Valere Dross  CURRENT THERAPY: Tamoxifen 20 mg by mouth daily started in May of 2014.  INTERVAL HISTORY: [name] 67 y.o. female came to the clinic today for follow-up visit. The patient is feeling fine with no specific complaints except for indigestion and bloating with excess intestinal gas. She is also worried about some enlargement in her thyroid gland. She is followed by Dr. Kenton Kingfisher her primary care physician for these issues. She had a mammogram in July 2017 that was unremarkable. She denied having any significant weight loss or night sweats. She has no nausea, vomiting, diarrhea or constipation. She denied having any chest pain, shortness of breath, cough or hemoptysis. She is here today for evaluation and repeat blood work.  MEDICAL HISTORY: Past Medical History:  Diagnosis Date  . Anxiety   . Breast cancer ([place])    2014 and 2015  . Cancer ([place])    lung cancer   . DDD (degenerative disc disease)    neck  . Eczema   . GERD (gastroesophageal reflux disease)    occasional - no current med.  Marland Kitchen History of breast cancer   . History of lung cancer    non-small cell - left upper lobe  . Osteoarthritis   . Personal history of radiation  therapy 2014  . PONV (postoperative nausea and vomiting)   . Radiation 07/03/12-07/09/12   Right upper lung 5400 cGy  . Sinus headache    occasional  . Thyroid nodule, toxic or with hyperthyroidism    no current med.  . Urinary, incontinence, stress female     ALLERGIES:  is allergic to amoxicillin; asa [aspirin]; banana; ceftin [cefuroxime axetil]; lactose intolerance (gi); orange fruit [citrus]; penicillins; and adhesive [tape].  MEDICATIONS:  Current Outpatient Medications  Medication Sig Dispense Refill  . acetaminophen (TYLENOL) 500 MG tablet Take 1,000 mg by mouth every 6 (six) hours as needed (arthritis).    . cyclobenzaprine (FLEXERIL) 10 MG tablet Take 1 tablet by mouth 3 (three) times daily as needed.  0  . cycloSPORINE (RESTASIS) 0.05 % ophthalmic emulsion 1 drop 2 (two) times daily.    . fluocinonide gel (LIDEX) 0.05 %     . fluticasone (FLONASE) 50 MCG/ACT nasal spray Place 1 spray into both nostrils daily.    Marland Kitchen ibuprofen (ADVIL,MOTRIN) 600 MG tablet Take 600 mg by mouth 2 (two) times daily.  0  . Multiple Vitamin (MULTIVITAMIN) capsule Take 1 capsule by mouth.    Marland Kitchen PATANOL 0.1 % ophthalmic solution Place 1 drop into both eyes as needed for allergies. Reported on 04/28/2015    . Polyethyl Glycol-Propyl Glycol (SYSTANE OP) Apply 1 drop to eye as needed (dry eyes). Reported on 04/28/2015    . ranitidine (ZANTAC) 150 MG tablet Take 150 mg  by mouth 2 (two) times daily as needed.    . tamoxifen (NOLVADEX) 20 MG tablet TAKE 1 TABLET (20 MG TOTAL) BY MOUTH DAILY. 30 tablet 5  . trimethoprim (TRIMPEX) 100 MG tablet Take 1 tablet by mouth at bedtime.  11  . vitamin E 400 UNIT capsule Take 400 Units by mouth.    . meloxicam (MOBIC) 15 MG tablet Take 1 tablet by mouth daily as needed.  0   No current facility-administered medications for this visit.     SURGICAL HISTORY:  Past Surgical History:  Procedure Laterality Date  . APPENDECTOMY  1971  . BREAST LUMPECTOMY Right 2014  .  BREAST RECONSTRUCTION WITH PLACEMENT OF TISSUE EXPANDER AND FLEX HD (ACELLULAR HYDRATED DERMIS) Right 05/20/2013   Procedure: IMMEDIATE RIGHT BREAST RECONSTRUCTION WITH LATISSAMUS MUSCLE FLAP AND PLACEMENT OF TISSUE EXPANDER TO RIGHT BREAST;  Surgeon: Theodoro Kos, DO;  Location: [facility];  Service: Plastics;  Laterality: Right;  . BREAST REDUCTION WITH MASTOPEXY Left 10/22/2013   Procedure: BREAST REDUCTION/MASTOPEXY TO LEFT BREAST WITH LIPOSUCTION/FAT GRAFTING FOR SYMMETRY;  Surgeon: Theodoro Kos, DO;  Location: [facility];  Service: Plastics;  Laterality: Left;  . DE QUERVAIN'S RELEASE Left 07/31/2002  . MASTECTOMY Right    2015  . MASTECTOMY W/ SENTINEL NODE BIOPSY Right 05/20/2013   Procedure: RIGHT SIMPLEMASTECTOMY AND SENTINEL NODE   Sampling;  Surgeon: Joyice Faster. Cornett, MD;  Location: [facility];  Service: General;  Laterality: Right;  . MOUTH SURGERY Right may 2014   "keratocystic odotogenic tumor" - X 2  . NASAL SINUS SURGERY  ?1988-1990   "at least 3" (05/21/2013)  . PARTIAL MASTECTOMY WITH NEEDLE LOCALIZATION  04/10/2012   Procedure: PARTIAL MASTECTOMY WITH NEEDLE LOCALIZATION;  Surgeon: Marcello Moores A. Cornett, MD;  Location: [facility];  Service: General;  Laterality: Right;  . REDUCTION MAMMAPLASTY Left 2015  . REMOVAL OF TISSUE EXPANDER AND PLACEMENT OF IMPLANT Right 10/22/2013   Procedure: REMOVAL OF RIGHT TISSUE EXPANDER WITH PLACEMENT OF RIGHT BREAST IMPLANT;  Surgeon: Theodoro Kos, DO;  Location: [facility];  Service: Plastics;  Laterality: Right;  . THORACOTOMY Left 07/14/2004  . TRIGGER FINGER RELEASE Bilateral    "I've had OR on q finger" (05/21/2013)  . TRIGGER FINGER RELEASE Right 07/14/1999   index and little fingers  . TRIGGER FINGER RELEASE Right 07/17/2002   long and ring fingers  . TRIGGER FINGER RELEASE Left 07/31/2002   little, ring and index fingers  . VIDEO ASSISTED THORACOSCOPY (VATS)/ LOBECTOMY Left 07/14/2004   upper  tri-segmentectomy  . WISDOM TOOTH EXTRACTION     "3"    REVIEW OF SYSTEMS:  A comprehensive review of systems was negative except for: Gastrointestinal: positive for dyspepsia   PHYSICAL EXAMINATION: General appearance: alert, cooperative and no distress Head: Normocephalic, without obvious abnormality, atraumatic Neck: no adenopathy Lymph nodes: Cervical, supraclavicular, and axillary nodes normal. Resp: clear to auscultation bilaterally Back: symmetric, no curvature. ROM normal. No CVA tenderness. Cardio: regular rate and rhythm, S1, S2 normal, no murmur, click, rub or gallop GI: soft, non-tender; bowel sounds normal; no masses,  no organomegaly Extremities: extremities normal, atraumatic, no cyanosis or edema  ECOG PERFORMANCE STATUS: 0 - Asymptomatic  Blood pressure (!) 144/81, pulse 74, temperature 98.1 F (36.7 C), temperature source Oral, resp. rate 18, height 5\' 4"  (1.626 m), weight 161 lb 9.6 oz (73.3 kg), SpO2 100 %.  LABORATORY DATA: Lab Results  Component Value Date   WBC 6.1 04/30/2017   HGB  13.0 04/30/2017   HCT 38.9 04/30/2017   MCV 92.3 04/30/2017   PLT 340 04/30/2017      Chemistry      Component Value Date/Time   NA 140 04/30/2017 0854   NA 141 10/29/2016 0742   K 4.3 04/30/2017 0854   K 4.1 10/29/2016 0742   CL 107 04/30/2017 0854   CL 108 (H) 04/28/2012 1008   CO2 24 04/30/2017 0854   CO2 26 10/29/2016 0742   BUN 18 04/30/2017 0854   BUN 21.1 10/29/2016 0742   CREATININE 0.84 04/30/2017 0854   CREATININE 0.9 10/29/2016 0742      Component Value Date/Time   CALCIUM 9.2 04/30/2017 0854   CALCIUM 9.0 10/29/2016 0742   ALKPHOS 62 04/30/2017 0854   ALKPHOS 49 10/29/2016 0742   AST 14 04/30/2017 0854   AST 15 10/29/2016 0742   ALT 11 04/30/2017 0854   ALT 12 10/29/2016 0742   BILITOT 0.3 04/30/2017 0854   BILITOT 0.32 10/29/2016 0742       RADIOGRAPHIC STUDIES: Dg Chest 1 View  Result Date: 04/30/2017 CLINICAL DATA:  Malignant neoplasm of  the left upper lung. EXAM: CHEST 1 VIEW COMPARISON:  04/21/2015 FINDINGS: Heart size and mediastinal contours are within normal limits. Lungs appear hyperinflated with chronic changes of emphysema. There is no pleural effusion or edema. Nodular opacity within the right lung apex is unchanged when compared with previous exam. Chronic left 6 rib deformity is identified. IMPRESSION: 1. No active disease. 2. Emphysema. Electronically Signed   By: Kerby Moors M.D.   On: 04/30/2017 09:37   ASSESSMENT AND PLAN:  This is a very pleasant 67 years old white female with stage II a non-small cell lung cancer as well as recurrent breast ductal carcinoma in situ. The patient is status post right lumpectomy followed by adjuvant radiotherapy. She is currently on treatment with tamoxifen and tolerating it fairly well. Her recent blood work and chest x-ray are unremarkable for any disease progression. I discussed the lab and the imaging results with the patient today. I recommended for her to continue on observation with repeat blood work in 6 months. She will also have a mammogram in July 2018. For the indigestion and enlargement of her thyroid, she was advised to consult with Dr. Kenton Kingfisher for further evaluation. She was advised to call immediately if she has any concerning symptoms in the interval. All questions were answered. The patient knows to call the clinic with any problems, questions or concerns. We can certainly see the patient much sooner if necessary. I spent 10 minutes counseling the patient face to face. The total time spent in the appointment was 15 minutes.  Disclaimer: This note was dictated with voice recognition software. Similar sounding words can inadvertently be transcribed and may be missed upon review.           [place] Telephone:(336) [phone]   Fax:(336) [phone]  OFFICE PROGRESS NOTE  [name], MD [place]  [id number]  DIAGNOSIS:  1) stage IIA non-small cell lung cancer diagnosed in March of 2006 2) recurrent ductal carcinoma in situ of the right breast initially diagnosed in January of 2014 with recurrence in October of 2014.  PRIOR THERAPY: 1) status post left trisegmentectomy with node dissection on 06/24/2004 2) status post 4 cycles of adjuvant chemotherapy with carboplatin and paclitaxel last dose was given 10/24/2004. 3) status post right partial mastectomy on 04/10/2012 under the care of Dr.  Cornett. 4) status post adjuvant radiotherapy to the right breast under the care of Dr. Valere Dross  CURRENT THERAPY: Tamoxifen 20 mg by mouth daily started in May of 2014.  INTERVAL HISTORY: [name] 67 y.o. female returns to the clinic today for follow-up visit.  The patient is feeling fine today with no specific complaints.  She continues to tolerate her treatment with tamoxifen fairly well.  She denied having any chest pain, shortness of breath, cough or hemoptysis.  She has no fever or chills.  She was recently treated for urinary tract infection.  The patient had repeat blood work as well as a chest x-ray performed recent and she is here for evaluation and discussion of her lab and imaging results.  MEDICAL HISTORY: Past Medical History:  Diagnosis Date  . Anxiety   . Breast cancer ([place])    2014 and 2015  . Cancer ([place])    lung cancer   . DDD (degenerative disc disease)    neck  . Eczema   . GERD (gastroesophageal reflux disease)    occasional - no current med.  Marland Kitchen History of breast cancer   . History of lung cancer    non-small cell - left upper lobe  . Osteoarthritis   . Personal history of radiation therapy 2014  . PONV (postoperative nausea and vomiting)   . Radiation 07/03/12-07/09/12   Right upper lung 5400 cGy  . Sinus headache    occasional  . Thyroid nodule, toxic or with hyperthyroidism    no current med.  . Urinary, incontinence, stress female     ALLERGIES:  is  allergic to amoxicillin; asa [aspirin]; banana; ceftin [cefuroxime axetil]; lactose intolerance (gi); orange fruit [citrus]; penicillins; and adhesive [tape].  MEDICATIONS:  Current Outpatient Medications  Medication Sig Dispense Refill  . acetaminophen (TYLENOL) 500 MG tablet Take 1,000 mg by mouth every 6 (six) hours as needed (arthritis).    . cyclobenzaprine (FLEXERIL) 10 MG tablet Take 1 tablet by mouth 3 (three) times daily as needed.  0  . cycloSPORINE (RESTASIS) 0.05 % ophthalmic emulsion 1 drop 2 (two) times daily.    . fluocinonide gel (LIDEX) 0.05 %     . fluticasone (FLONASE) 50 MCG/ACT nasal spray Place 1 spray into both nostrils daily.    Marland Kitchen ibuprofen (ADVIL,MOTRIN) 600 MG tablet Take 600 mg by mouth 2 (two) times daily.  0  . Multiple Vitamin (MULTIVITAMIN) capsule Take 1 capsule by mouth.    Marland Kitchen PATANOL 0.1 % ophthalmic solution Place 1 drop into both eyes as needed for allergies. Reported on 04/28/2015    . Polyethyl Glycol-Propyl Glycol (SYSTANE OP) Apply 1 drop to eye as needed (dry eyes). Reported on 04/28/2015    . ranitidine (ZANTAC) 150 MG tablet Take 150 mg by mouth 2 (two) times daily as needed.    . tamoxifen (NOLVADEX) 20 MG tablet TAKE 1 TABLET (20 MG TOTAL) BY MOUTH DAILY. 30 tablet 5  . trimethoprim (TRIMPEX) 100 MG tablet Take 1 tablet by mouth at bedtime.  11  . vitamin E 400 UNIT capsule Take 400 Units by mouth.    . meloxicam (MOBIC) 15 MG tablet Take 1 tablet by mouth daily as needed.  0   No current facility-administered medications for this visit.     SURGICAL HISTORY:  Past Surgical History:  Procedure Laterality Date  . APPENDECTOMY  1971  . BREAST LUMPECTOMY Right 2014  . BREAST RECONSTRUCTION WITH PLACEMENT OF TISSUE EXPANDER AND FLEX HD (ACELLULAR HYDRATED  DERMIS) Right 05/20/2013   Procedure: IMMEDIATE RIGHT BREAST RECONSTRUCTION WITH LATISSAMUS MUSCLE FLAP AND PLACEMENT OF TISSUE EXPANDER TO RIGHT BREAST;  Surgeon: Theodoro Kos, DO;  Location: [facility];   Service: Plastics;  Laterality: Right;  . BREAST REDUCTION WITH MASTOPEXY Left 10/22/2013   Procedure: BREAST REDUCTION/MASTOPEXY TO LEFT BREAST WITH LIPOSUCTION/FAT GRAFTING FOR SYMMETRY;  Surgeon: Theodoro Kos, DO;  Location: [facility];  Service: Plastics;  Laterality: Left;  . DE QUERVAIN'S RELEASE Left 07/31/2002  . MASTECTOMY Right    2015  . MASTECTOMY W/ SENTINEL NODE BIOPSY Right 05/20/2013   Procedure: RIGHT SIMPLEMASTECTOMY AND SENTINEL NODE   Sampling;  Surgeon: Joyice Faster. Cornett, MD;  Location: [facility];  Service: General;  Laterality: Right;  . MOUTH SURGERY Right may 2014   "keratocystic odotogenic tumor" - X 2  . NASAL SINUS SURGERY  ?1988-1990   "at least 3" (05/21/2013)  . PARTIAL MASTECTOMY WITH NEEDLE LOCALIZATION  04/10/2012   Procedure: PARTIAL MASTECTOMY WITH NEEDLE LOCALIZATION;  Surgeon: Marcello Moores A. Cornett, MD;  Location: [facility];  Service: General;  Laterality: Right;  . REDUCTION MAMMAPLASTY Left 2015  . REMOVAL OF TISSUE EXPANDER AND PLACEMENT OF IMPLANT Right 10/22/2013   Procedure: REMOVAL OF RIGHT TISSUE EXPANDER WITH PLACEMENT OF RIGHT BREAST IMPLANT;  Surgeon: Theodoro Kos, DO;  Location: [facility];  Service: Plastics;  Laterality: Right;  . THORACOTOMY Left 07/14/2004  . TRIGGER FINGER RELEASE Bilateral    "I've had OR on q finger" (05/21/2013)  . TRIGGER FINGER RELEASE Right 07/14/1999   index and little fingers  . TRIGGER FINGER RELEASE Right 07/17/2002   long and ring fingers  . TRIGGER FINGER RELEASE Left 07/31/2002   little, ring and index fingers  . VIDEO ASSISTED THORACOSCOPY (VATS)/ LOBECTOMY Left 07/14/2004   upper tri-segmentectomy  . WISDOM TOOTH EXTRACTION     "3"    REVIEW OF SYSTEMS:  A comprehensive review of systems was negative.   PHYSICAL EXAMINATION: General appearance: alert, cooperative and no distress Head: Normocephalic, without obvious abnormality, atraumatic Neck: no adenopathy Lymph  nodes: Cervical, supraclavicular, and axillary nodes normal. Resp: clear to auscultation bilaterally Back: symmetric, no curvature. ROM normal. No CVA tenderness. Cardio: regular rate and rhythm, S1, S2 normal, no murmur, click, rub or gallop GI: soft, non-tender; bowel sounds normal; no masses,  no organomegaly Extremities: extremities normal, atraumatic, no cyanosis or edema  ECOG PERFORMANCE STATUS: 0 - Asymptomatic  Blood pressure (!) 144/81, pulse 74, temperature 98.1 F (36.7 C), temperature source Oral, resp. rate 18, height 5\' 4"  (1.626 m), weight 161 lb 9.6 oz (73.3 kg), SpO2 100 %.  LABORATORY DATA: Lab Results  Component Value Date   WBC 6.1 04/30/2017   HGB 13.0 04/30/2017   HCT 38.9 04/30/2017   MCV 92.3 04/30/2017   PLT 340 04/30/2017      Chemistry      Component Value Date/Time   NA 140 04/30/2017 0854   NA 141 10/29/2016 0742   K 4.3 04/30/2017 0854   K 4.1 10/29/2016 0742   CL 107 04/30/2017 0854   CL 108 (H) 04/28/2012 1008   CO2 24 04/30/2017 0854   CO2 26 10/29/2016 0742   BUN 18 04/30/2017 0854   BUN 21.1 10/29/2016 0742   CREATININE 0.84 04/30/2017 0854   CREATININE 0.9 10/29/2016 0742      Component Value Date/Time   CALCIUM 9.2 04/30/2017 0854   CALCIUM 9.0 10/29/2016 0742   ALKPHOS 62 04/30/2017 0854  ALKPHOS 49 10/29/2016 0742   AST 14 04/30/2017 0854   AST 15 10/29/2016 0742   ALT 11 04/30/2017 0854   ALT 12 10/29/2016 0742   BILITOT 0.3 04/30/2017 0854   BILITOT 0.32 10/29/2016 0742       RADIOGRAPHIC STUDIES: Dg Chest 1 View  Result Date: 04/30/2017 CLINICAL DATA:  Malignant neoplasm of the left upper lung. EXAM: CHEST 1 VIEW COMPARISON:  04/21/2015 FINDINGS: Heart size and mediastinal contours are within normal limits. Lungs appear hyperinflated with chronic changes of emphysema. There is no pleural effusion or edema. Nodular opacity within the right lung apex is unchanged when compared with previous exam. Chronic left 6 rib  deformity is identified. IMPRESSION: 1. No active disease. 2. Emphysema. Electronically Signed   By: Kerby Moors M.D.   On: 04/30/2017 09:37   ASSESSMENT AND PLAN:  This is a very pleasant 67 years old white female with stage IIA non-small cell lung cancer as well as recurrent breast ductal carcinoma in situ. The patient is status post right lumpectomy followed by adjuvant radiotherapy. She is currently on treatment with tamoxifen and tolerating it fairly well. The patient had repeat CBC, comprehensive metabolic panel and CA 63.14 performed recently that were unremarkable.  Her chest x-ray also showed no acute findings. I recommended for her to continue her current treatment with tamoxifen. I will see her back for follow-up visit in 6 months for evaluation after repeat mammogram and blood work. She was advised to call immediately if she has any concerning symptoms in the interval. All questions were answered. The patient knows to call the clinic with any problems, questions or concerns. We can certainly see the patient much sooner if necessary. I spent 10 minutes counseling the patient face to face. The total time spent in the appointment was 15 minutes.  Disclaimer: This note was dictated with voice recognition software. Similar sounding words can inadvertently be transcribed and may be missed upon review.

## 2017-05-07 NOTE — Telephone Encounter (Signed)
Printed avs and calender for upcopming appointment per 2/12 los

## 2017-05-08 DIAGNOSIS — S86812D Strain of other muscle(s) and tendon(s) at lower leg level, left leg, subsequent encounter: Secondary | ICD-10-CM | POA: Diagnosis not present

## 2017-05-08 DIAGNOSIS — M79662 Pain in left lower leg: Secondary | ICD-10-CM | POA: Diagnosis not present

## 2017-05-15 DIAGNOSIS — M79662 Pain in left lower leg: Secondary | ICD-10-CM | POA: Diagnosis not present

## 2017-05-15 DIAGNOSIS — S86812D Strain of other muscle(s) and tendon(s) at lower leg level, left leg, subsequent encounter: Secondary | ICD-10-CM | POA: Diagnosis not present

## 2017-05-21 DIAGNOSIS — M7052 Other bursitis of knee, left knee: Secondary | ICD-10-CM | POA: Diagnosis not present

## 2017-05-21 DIAGNOSIS — M25562 Pain in left knee: Secondary | ICD-10-CM | POA: Diagnosis not present

## 2017-06-20 DIAGNOSIS — M25562 Pain in left knee: Secondary | ICD-10-CM | POA: Diagnosis not present

## 2017-06-26 DIAGNOSIS — M25562 Pain in left knee: Secondary | ICD-10-CM | POA: Diagnosis not present

## 2017-07-02 DIAGNOSIS — S83242A Other tear of medial meniscus, current injury, left knee, initial encounter: Secondary | ICD-10-CM | POA: Diagnosis not present

## 2017-07-10 DIAGNOSIS — J309 Allergic rhinitis, unspecified: Secondary | ICD-10-CM | POA: Diagnosis not present

## 2017-07-18 ENCOUNTER — Other Ambulatory Visit: Payer: Self-pay | Admitting: Internal Medicine

## 2017-07-18 DIAGNOSIS — D0511 Intraductal carcinoma in situ of right breast: Secondary | ICD-10-CM

## 2017-07-23 ENCOUNTER — Telehealth: Payer: Self-pay | Admitting: Internal Medicine

## 2017-07-23 NOTE — Telephone Encounter (Signed)
Faxed office note to [facility]

## 2017-07-24 DIAGNOSIS — M2341 Loose body in knee, right knee: Secondary | ICD-10-CM | POA: Diagnosis not present

## 2017-07-24 DIAGNOSIS — M94261 Chondromalacia, right knee: Secondary | ICD-10-CM | POA: Diagnosis not present

## 2017-07-24 DIAGNOSIS — M23221 Derangement of posterior horn of medial meniscus due to old tear or injury, right knee: Secondary | ICD-10-CM | POA: Diagnosis not present

## 2017-07-24 DIAGNOSIS — G8918 Other acute postprocedural pain: Secondary | ICD-10-CM | POA: Diagnosis not present

## 2017-07-24 DIAGNOSIS — M6751 Plica syndrome, right knee: Secondary | ICD-10-CM | POA: Diagnosis not present

## 2017-08-01 DIAGNOSIS — Z4789 Encounter for other orthopedic aftercare: Secondary | ICD-10-CM | POA: Diagnosis not present

## 2017-08-01 DIAGNOSIS — M25562 Pain in left knee: Secondary | ICD-10-CM | POA: Diagnosis not present

## 2017-08-05 DIAGNOSIS — M25562 Pain in left knee: Secondary | ICD-10-CM | POA: Diagnosis not present

## 2017-08-05 DIAGNOSIS — Z4789 Encounter for other orthopedic aftercare: Secondary | ICD-10-CM | POA: Diagnosis not present

## 2017-08-07 DIAGNOSIS — Z4789 Encounter for other orthopedic aftercare: Secondary | ICD-10-CM | POA: Diagnosis not present

## 2017-08-07 DIAGNOSIS — M25562 Pain in left knee: Secondary | ICD-10-CM | POA: Diagnosis not present

## 2017-08-12 DIAGNOSIS — M26622 Arthralgia of left temporomandibular joint: Secondary | ICD-10-CM | POA: Diagnosis not present

## 2017-08-13 DIAGNOSIS — M25562 Pain in left knee: Secondary | ICD-10-CM | POA: Diagnosis not present

## 2017-08-13 DIAGNOSIS — Z4789 Encounter for other orthopedic aftercare: Secondary | ICD-10-CM | POA: Diagnosis not present

## 2017-08-15 DIAGNOSIS — Z4789 Encounter for other orthopedic aftercare: Secondary | ICD-10-CM | POA: Diagnosis not present

## 2017-08-15 DIAGNOSIS — M25562 Pain in left knee: Secondary | ICD-10-CM | POA: Diagnosis not present

## 2017-08-20 DIAGNOSIS — Z4789 Encounter for other orthopedic aftercare: Secondary | ICD-10-CM | POA: Diagnosis not present

## 2017-08-20 DIAGNOSIS — M25562 Pain in left knee: Secondary | ICD-10-CM | POA: Diagnosis not present

## 2017-08-22 DIAGNOSIS — Z4789 Encounter for other orthopedic aftercare: Secondary | ICD-10-CM | POA: Diagnosis not present

## 2017-08-22 DIAGNOSIS — Z9889 Other specified postprocedural states: Secondary | ICD-10-CM | POA: Diagnosis not present

## 2017-08-22 DIAGNOSIS — M25562 Pain in left knee: Secondary | ICD-10-CM | POA: Diagnosis not present

## 2017-08-26 ENCOUNTER — Other Ambulatory Visit: Payer: Self-pay | Admitting: Family Medicine

## 2017-08-26 DIAGNOSIS — Z1231 Encounter for screening mammogram for malignant neoplasm of breast: Secondary | ICD-10-CM

## 2017-08-27 DIAGNOSIS — M25562 Pain in left knee: Secondary | ICD-10-CM | POA: Diagnosis not present

## 2017-08-27 DIAGNOSIS — Z4789 Encounter for other orthopedic aftercare: Secondary | ICD-10-CM | POA: Diagnosis not present

## 2017-08-29 DIAGNOSIS — Z4789 Encounter for other orthopedic aftercare: Secondary | ICD-10-CM | POA: Diagnosis not present

## 2017-08-29 DIAGNOSIS — M25562 Pain in left knee: Secondary | ICD-10-CM | POA: Diagnosis not present

## 2017-09-04 DIAGNOSIS — D164 Benign neoplasm of bones of skull and face: Secondary | ICD-10-CM | POA: Diagnosis not present

## 2017-09-23 ENCOUNTER — Ambulatory Visit
Admission: RE | Admit: 2017-09-23 | Discharge: 2017-09-23 | Disposition: A | Discharge status: Home / Self Care | Payer: Medicare Other | Source: Ambulatory Visit | Attending: Family Medicine | Admitting: Family Medicine

## 2017-09-23 DIAGNOSIS — Z1231 Encounter for screening mammogram for malignant neoplasm of breast: Secondary | ICD-10-CM

## 2017-10-10 DIAGNOSIS — M1712 Unilateral primary osteoarthritis, left knee: Secondary | ICD-10-CM | POA: Diagnosis not present

## 2017-10-29 ENCOUNTER — Other Ambulatory Visit: Payer: Medicare Other

## 2017-11-01 ENCOUNTER — Telehealth: Payer: Self-pay | Admitting: Internal Medicine

## 2017-11-01 NOTE — Telephone Encounter (Signed)
MM PAL - moved 8/20 f/u to 9/17 and 8/13 lab to 9/10. Left message for patient. Schedule mailed.

## 2017-11-05 ENCOUNTER — Other Ambulatory Visit: Payer: Medicare Other

## 2017-11-05 ENCOUNTER — Ambulatory Visit: Payer: Medicare Other | Admitting: Internal Medicine

## 2017-11-12 ENCOUNTER — Ambulatory Visit: Payer: Medicare Other | Admitting: Internal Medicine

## 2017-11-18 DIAGNOSIS — H524 Presbyopia: Secondary | ICD-10-CM | POA: Diagnosis not present

## 2017-11-18 DIAGNOSIS — H5203 Hypermetropia, bilateral: Secondary | ICD-10-CM | POA: Diagnosis not present

## 2017-11-18 DIAGNOSIS — H04123 Dry eye syndrome of bilateral lacrimal glands: Secondary | ICD-10-CM | POA: Diagnosis not present

## 2017-11-18 DIAGNOSIS — H52222 Regular astigmatism, left eye: Secondary | ICD-10-CM | POA: Diagnosis not present

## 2017-11-18 DIAGNOSIS — H2513 Age-related nuclear cataract, bilateral: Secondary | ICD-10-CM | POA: Diagnosis not present

## 2017-11-22 DIAGNOSIS — H903 Sensorineural hearing loss, bilateral: Secondary | ICD-10-CM | POA: Diagnosis not present

## 2017-12-02 DIAGNOSIS — Z23 Encounter for immunization: Secondary | ICD-10-CM | POA: Diagnosis not present

## 2017-12-03 ENCOUNTER — Inpatient Hospital Stay: Payer: Medicare Other | Attending: Internal Medicine

## 2017-12-03 DIAGNOSIS — Z170 Estrogen receptor positive status [ER+]: Secondary | ICD-10-CM | POA: Diagnosis not present

## 2017-12-03 DIAGNOSIS — K219 Gastro-esophageal reflux disease without esophagitis: Secondary | ICD-10-CM | POA: Diagnosis not present

## 2017-12-03 DIAGNOSIS — Z79899 Other long term (current) drug therapy: Secondary | ICD-10-CM | POA: Insufficient documentation

## 2017-12-03 DIAGNOSIS — Z9221 Personal history of antineoplastic chemotherapy: Secondary | ICD-10-CM | POA: Diagnosis not present

## 2017-12-03 DIAGNOSIS — K30 Functional dyspepsia: Secondary | ICD-10-CM | POA: Insufficient documentation

## 2017-12-03 DIAGNOSIS — F419 Anxiety disorder, unspecified: Secondary | ICD-10-CM | POA: Diagnosis not present

## 2017-12-03 DIAGNOSIS — R140 Abdominal distension (gaseous): Secondary | ICD-10-CM | POA: Diagnosis not present

## 2017-12-03 DIAGNOSIS — M5030 Other cervical disc degeneration, unspecified cervical region: Secondary | ICD-10-CM | POA: Diagnosis not present

## 2017-12-03 DIAGNOSIS — D0511 Intraductal carcinoma in situ of right breast: Secondary | ICD-10-CM

## 2017-12-03 DIAGNOSIS — E079 Disorder of thyroid, unspecified: Secondary | ICD-10-CM | POA: Diagnosis not present

## 2017-12-03 DIAGNOSIS — C50111 Malignant neoplasm of central portion of right female breast: Secondary | ICD-10-CM | POA: Diagnosis not present

## 2017-12-03 DIAGNOSIS — Z79810 Long term (current) use of selective estrogen receptor modulators (SERMs): Secondary | ICD-10-CM | POA: Diagnosis not present

## 2017-12-03 DIAGNOSIS — M1990 Unspecified osteoarthritis, unspecified site: Secondary | ICD-10-CM | POA: Insufficient documentation

## 2017-12-03 DIAGNOSIS — Z85118 Personal history of other malignant neoplasm of bronchus and lung: Secondary | ICD-10-CM | POA: Diagnosis not present

## 2017-12-03 DIAGNOSIS — Z9011 Acquired absence of right breast and nipple: Secondary | ICD-10-CM | POA: Diagnosis not present

## 2017-12-03 DIAGNOSIS — Z923 Personal history of irradiation: Secondary | ICD-10-CM | POA: Insufficient documentation

## 2017-12-03 LAB — CBC WITH DIFFERENTIAL (CANCER CENTER ONLY)
BASOS PCT: 0 %
Basophils Absolute: 0 10*3/uL (ref 0.0–0.1)
EOS ABS: 0.3 10*3/uL (ref 0.0–0.5)
Eosinophils Relative: 6 %
HEMATOCRIT: 39.7 % (ref 34.8–46.6)
HEMOGLOBIN: 13 g/dL (ref 11.6–15.9)
LYMPHS ABS: 0.7 10*3/uL — AB (ref 0.9–3.3)
Lymphocytes Relative: 12 %
MCH: 30.6 pg (ref 25.1–34.0)
MCHC: 32.6 g/dL (ref 31.5–36.0)
MCV: 93.7 fL (ref 79.5–101.0)
MONOS PCT: 6 %
Monocytes Absolute: 0.3 10*3/uL (ref 0.1–0.9)
NEUTROS ABS: 4.4 10*3/uL (ref 1.5–6.5)
NEUTROS PCT: 76 %
Platelet Count: 281 10*3/uL (ref 145–400)
RBC: 4.24 MIL/uL (ref 3.70–5.45)
RDW: 14 % (ref 11.2–14.5)
WBC: 5.8 10*3/uL (ref 3.9–10.3)

## 2017-12-03 LAB — CMP (CANCER CENTER ONLY)
ALBUMIN: 3.4 g/dL — AB (ref 3.5–5.0)
ALK PHOS: 57 U/L (ref 38–126)
ALT: 10 U/L (ref 0–44)
ANION GAP: 12 (ref 5–15)
AST: 13 U/L — ABNORMAL LOW (ref 15–41)
BUN: 12 mg/dL (ref 8–23)
CALCIUM: 9.4 mg/dL (ref 8.9–10.3)
CO2: 24 mmol/L (ref 22–32)
CREATININE: 0.82 mg/dL (ref 0.44–1.00)
Chloride: 110 mmol/L (ref 98–111)
GFR, Est AFR Am: >60 mL/min (ref >60)
GFR, Estimated: >60 mL/min (ref >60)
GLUCOSE: 113 mg/dL — AB (ref 70–99)
Potassium: 4.4 mmol/L (ref 3.5–5.1)
SODIUM: 146 mmol/L — AB (ref 135–145)
Total Bilirubin: 0.5 mg/dL (ref 0.3–1.2)
Total Protein: 6.8 g/dL (ref 6.5–8.1)

## 2017-12-04 LAB — CANCER ANTIGEN 27.29: CAN 27.29: 34.4 U/mL (ref 0.0–38.6)

## 2017-12-10 ENCOUNTER — Inpatient Hospital Stay (HOSPITAL_BASED_OUTPATIENT_CLINIC_OR_DEPARTMENT_OTHER): Payer: Medicare Other | Admitting: Internal Medicine

## 2017-12-10 ENCOUNTER — Encounter: Payer: Self-pay | Admitting: Internal Medicine

## 2017-12-10 ENCOUNTER — Telehealth: Payer: Self-pay

## 2017-12-10 VITALS — BP 144/76 | HR 63 | Temp 98.2°F | Resp 18 | Ht 64.0 in | Wt 162.3 lb

## 2017-12-10 DIAGNOSIS — E079 Disorder of thyroid, unspecified: Secondary | ICD-10-CM | POA: Diagnosis not present

## 2017-12-10 DIAGNOSIS — F419 Anxiety disorder, unspecified: Secondary | ICD-10-CM

## 2017-12-10 DIAGNOSIS — Z85118 Personal history of other malignant neoplasm of bronchus and lung: Secondary | ICD-10-CM

## 2017-12-10 DIAGNOSIS — M1990 Unspecified osteoarthritis, unspecified site: Secondary | ICD-10-CM

## 2017-12-10 DIAGNOSIS — Z9221 Personal history of antineoplastic chemotherapy: Secondary | ICD-10-CM | POA: Diagnosis not present

## 2017-12-10 DIAGNOSIS — Z9011 Acquired absence of right breast and nipple: Secondary | ICD-10-CM

## 2017-12-10 DIAGNOSIS — Z9889 Other specified postprocedural states: Secondary | ICD-10-CM

## 2017-12-10 DIAGNOSIS — Z923 Personal history of irradiation: Secondary | ICD-10-CM

## 2017-12-10 DIAGNOSIS — C50111 Malignant neoplasm of central portion of right female breast: Secondary | ICD-10-CM | POA: Diagnosis not present

## 2017-12-10 DIAGNOSIS — R059 Cough, unspecified: Secondary | ICD-10-CM

## 2017-12-10 DIAGNOSIS — Z79810 Long term (current) use of selective estrogen receptor modulators (SERMs): Secondary | ICD-10-CM

## 2017-12-10 DIAGNOSIS — K30 Functional dyspepsia: Secondary | ICD-10-CM | POA: Diagnosis not present

## 2017-12-10 DIAGNOSIS — D0511 Intraductal carcinoma in situ of right breast: Secondary | ICD-10-CM

## 2017-12-10 DIAGNOSIS — M5030 Other cervical disc degeneration, unspecified cervical region: Secondary | ICD-10-CM | POA: Diagnosis not present

## 2017-12-10 DIAGNOSIS — R140 Abdominal distension (gaseous): Secondary | ICD-10-CM | POA: Diagnosis not present

## 2017-12-10 DIAGNOSIS — K219 Gastro-esophageal reflux disease without esophagitis: Secondary | ICD-10-CM | POA: Diagnosis not present

## 2017-12-10 DIAGNOSIS — R05 Cough: Secondary | ICD-10-CM

## 2017-12-10 DIAGNOSIS — C3412 Malignant neoplasm of upper lobe, left bronchus or lung: Secondary | ICD-10-CM

## 2017-12-10 DIAGNOSIS — Z170 Estrogen receptor positive status [ER+]: Secondary | ICD-10-CM | POA: Diagnosis not present

## 2017-12-10 DIAGNOSIS — Z79899 Other long term (current) drug therapy: Secondary | ICD-10-CM

## 2017-12-10 NOTE — Addendum Note (Signed)
Addended by: Ardeen Garland on: 12/10/2017 10:28 AM   Modules accepted: Orders

## 2017-12-10 NOTE — Telephone Encounter (Signed)
Printed avs and calender of upcoming appointment. Per 9/16 los

## 2017-12-10 NOTE — Progress Notes (Signed)
[place] Telephone:(336) [phone]   Fax:(336) [phone]  OFFICE PROGRESS NOTE  [name], MD [place] [address]  DIAGNOSIS:  1) stage IIA non-small cell lung cancer diagnosed in March of 2006 2) recurrent ductal carcinoma in situ of the right breast initially diagnosed in January of 2014 with recurrence in October of 2014.  PRIOR THERAPY: 1) status post left trisegmentectomy with node dissection on 06/24/2004 2) status post 4 cycles of adjuvant chemotherapy with carboplatin and paclitaxel last dose was given 10/24/2004. 3) status post right partial mastectomy on 04/10/2012 under the care of Dr. Brantley Stage. 4) status post adjuvant radiotherapy to the right breast under the care of Dr. Valere Dross  CURRENT THERAPY: Tamoxifen 20 mg by mouth daily started in May of 2014.  INTERVAL HISTORY: [name] 67 y.o. female came to the clinic today for follow-up visit. The patient is feeling fine with no specific complaints except for indigestion and bloating with excess intestinal gas. She is also worried about some enlargement in her thyroid gland. She is followed by Dr. Kenton Kingfisher her primary care physician for these issues. She had a mammogram in July 2017 that was unremarkable. She denied having any significant weight loss or night sweats. She has no nausea, vomiting, diarrhea or constipation. She denied having any chest pain, shortness of breath, cough or hemoptysis. She is here today for evaluation and repeat blood work.  MEDICAL HISTORY: Past Medical History:  Diagnosis Date  . Anxiety   . Breast cancer ([place])    2014 and 2015  . Cancer ([place])    lung cancer   . DDD (degenerative disc disease)    neck  . Eczema   . GERD (gastroesophageal reflux disease)    occasional - no current med.  Marland Kitchen History of breast cancer   . History of lung cancer    non-small cell - left upper lobe  . Osteoarthritis   . Personal history of radiation  therapy 2014  . PONV (postoperative nausea and vomiting)   . Radiation 07/03/12-07/09/12   Right upper lung 5400 cGy  . Sinus headache    occasional  . Thyroid nodule, toxic or with hyperthyroidism    no current med.  . Urinary, incontinence, stress female     ALLERGIES:  is allergic to amoxicillin; asa [aspirin]; banana; ceftin [cefuroxime axetil]; lactose intolerance (gi); orange fruit [citrus]; penicillins; and adhesive [tape].  MEDICATIONS:  Current Outpatient Medications  Medication Sig Dispense Refill  . acetaminophen (TYLENOL) 500 MG tablet Take 1,000 mg by mouth every 6 (six) hours as needed (arthritis).    . cyclobenzaprine (FLEXERIL) 10 MG tablet Take 1 tablet by mouth 3 (three) times daily as needed.  0  . cycloSPORINE (RESTASIS) 0.05 % ophthalmic emulsion 1 drop 2 (two) times daily.    . fluocinonide gel (LIDEX) 0.05 %     . fluticasone (FLONASE) 50 MCG/ACT nasal spray Place 1 spray into both nostrils daily.    Marland Kitchen ibuprofen (ADVIL,MOTRIN) 600 MG tablet Take 600 mg by mouth 2 (two) times daily.  0  . Lactobacillus (ACIDOPHILUS) 0.5 MG TABS Take by mouth.    . meloxicam (MOBIC) 15 MG tablet Take 1 tablet by mouth daily as needed.  0  . Multiple Vitamin (MULTIVITAMIN) capsule Take 1 capsule by mouth.    Marland Kitchen PATANOL 0.1 % ophthalmic solution Place 1 drop into both eyes as needed for allergies. Reported on 04/28/2015    .  Polyethyl Glycol-Propyl Glycol (SYSTANE OP) Apply 1 drop to eye as needed (dry eyes). Reported on 04/28/2015    . ranitidine (ZANTAC) 150 MG tablet Take 150 mg by mouth 2 (two) times daily as needed.    . tamoxifen (NOLVADEX) 20 MG tablet TAKE 1 TABLET BY MOUTH EVERY DAY 30 tablet 5  . trimethoprim (TRIMPEX) 100 MG tablet Take 1 tablet by mouth at bedtime.  11  . vitamin E 400 UNIT capsule Take 400 Units by mouth.     No current facility-administered medications for this visit.     SURGICAL HISTORY:  Past Surgical History:  Procedure Laterality Date  .  APPENDECTOMY  1971  . BREAST LUMPECTOMY Right 2014  . BREAST RECONSTRUCTION WITH PLACEMENT OF TISSUE EXPANDER AND FLEX HD (ACELLULAR HYDRATED DERMIS) Right 05/20/2013   Procedure: IMMEDIATE RIGHT BREAST RECONSTRUCTION WITH LATISSAMUS MUSCLE FLAP AND PLACEMENT OF TISSUE EXPANDER TO RIGHT BREAST;  Surgeon: Theodoro Kos, DO;  Location: [facility];  Service: Plastics;  Laterality: Right;  . BREAST REDUCTION WITH MASTOPEXY Left 10/22/2013   Procedure: BREAST REDUCTION/MASTOPEXY TO LEFT BREAST WITH LIPOSUCTION/FAT GRAFTING FOR SYMMETRY;  Surgeon: Theodoro Kos, DO;  Location: [facility];  Service: Plastics;  Laterality: Left;  . DE QUERVAIN'S RELEASE Left 07/31/2002  . MASTECTOMY Right    2015  . MASTECTOMY W/ SENTINEL NODE BIOPSY Right 05/20/2013   Procedure: RIGHT SIMPLEMASTECTOMY AND SENTINEL NODE   Sampling;  Surgeon: Joyice Faster. Cornett, MD;  Location: [facility];  Service: General;  Laterality: Right;  . MOUTH SURGERY Right may 2014   "keratocystic odotogenic tumor" - X 2  . NASAL SINUS SURGERY  ?1988-1990   "at least 3" (05/21/2013)  . PARTIAL MASTECTOMY WITH NEEDLE LOCALIZATION  04/10/2012   Procedure: PARTIAL MASTECTOMY WITH NEEDLE LOCALIZATION;  Surgeon: Marcello Moores A. Cornett, MD;  Location: [facility];  Service: General;  Laterality: Right;  . REDUCTION MAMMAPLASTY Left 2015  . REMOVAL OF TISSUE EXPANDER AND PLACEMENT OF IMPLANT Right 10/22/2013   Procedure: REMOVAL OF RIGHT TISSUE EXPANDER WITH PLACEMENT OF RIGHT BREAST IMPLANT;  Surgeon: Theodoro Kos, DO;  Location: [facility];  Service: Plastics;  Laterality: Right;  . THORACOTOMY Left 07/14/2004  . TRIGGER FINGER RELEASE Bilateral    "I've had OR on q finger" (05/21/2013)  . TRIGGER FINGER RELEASE Right 07/14/1999   index and little fingers  . TRIGGER FINGER RELEASE Right 07/17/2002   long and ring fingers  . TRIGGER FINGER RELEASE Left 07/31/2002   little, ring and index fingers  . VIDEO ASSISTED THORACOSCOPY  (VATS)/ LOBECTOMY Left 07/14/2004   upper tri-segmentectomy  . WISDOM TOOTH EXTRACTION     "3"    REVIEW OF SYSTEMS:  A comprehensive review of systems was negative except for: Gastrointestinal: positive for dyspepsia   PHYSICAL EXAMINATION: General appearance: alert, cooperative and no distress Head: Normocephalic, without obvious abnormality, atraumatic Neck: no adenopathy Lymph nodes: Cervical, supraclavicular, and axillary nodes normal. Resp: clear to auscultation bilaterally Back: symmetric, no curvature. ROM normal. No CVA tenderness. Cardio: regular rate and rhythm, S1, S2 normal, no murmur, click, rub or gallop GI: soft, non-tender; bowel sounds normal; no masses,  no organomegaly Extremities: extremities normal, atraumatic, no cyanosis or edema  ECOG PERFORMANCE STATUS: 0 - Asymptomatic  Blood pressure (!) 144/76, pulse 63, temperature 98.2 F (36.8 C), temperature source Oral, resp. rate 18, height 5\' 4"  (1.626 m), weight 162 lb 4.8 oz (73.6 kg), SpO2 100 %.  LABORATORY DATA: Lab Results  Component  Value Date   WBC 5.8 12/03/2017   HGB 13.0 12/03/2017   HCT 39.7 12/03/2017   MCV 93.7 12/03/2017   PLT 281 12/03/2017      Chemistry      Component Value Date/Time   NA 146 (H) 12/03/2017 1102   NA 141 10/29/2016 0742   K 4.4 12/03/2017 1102   K 4.1 10/29/2016 0742   CL 110 12/03/2017 1102   CL 108 (H) 04/28/2012 1008   CO2 24 12/03/2017 1102   CO2 26 10/29/2016 0742   BUN 12 12/03/2017 1102   BUN 21.1 10/29/2016 0742   CREATININE 0.82 12/03/2017 1102   CREATININE 0.9 10/29/2016 0742      Component Value Date/Time   CALCIUM 9.4 12/03/2017 1102   CALCIUM 9.0 10/29/2016 0742   ALKPHOS 57 12/03/2017 1102   ALKPHOS 49 10/29/2016 0742   AST 13 (L) 12/03/2017 1102   AST 15 10/29/2016 0742   ALT 10 12/03/2017 1102   ALT 12 10/29/2016 0742   BILITOT 0.5 12/03/2017 1102   BILITOT 0.32 10/29/2016 0742       RADIOGRAPHIC STUDIES: No results found. ASSESSMENT  AND PLAN:  This is a very pleasant 67 years old white female with stage II a non-small cell lung cancer as well as recurrent breast ductal carcinoma in situ. The patient is status post right lumpectomy followed by adjuvant radiotherapy. She is currently on treatment with tamoxifen and tolerating it fairly well. Her recent blood work and chest x-ray are unremarkable for any disease progression. I discussed the lab and the imaging results with the patient today. I recommended for her to continue on observation with repeat blood work in 6 months. She will also have a mammogram in July 2018. For the indigestion and enlargement of her thyroid, she was advised to consult with Dr. Kenton Kingfisher for further evaluation. She was advised to call immediately if she has any concerning symptoms in the interval. All questions were answered. The patient knows to call the clinic with any problems, questions or concerns. We can certainly see the patient much sooner if necessary. I spent 10 minutes counseling the patient face to face. The total time spent in the appointment was 15 minutes.  Disclaimer: This note was dictated with voice recognition software. Similar sounding words can inadvertently be transcribed and may be missed upon review.           [place] Telephone:(336) [phone]   Fax:(336) [phone]  OFFICE PROGRESS NOTE  [name], MD [place] [address]  DIAGNOSIS:  1) stage IIA non-small cell lung cancer diagnosed in March of 2006 2) recurrent ductal carcinoma in situ of the right breast initially diagnosed in January of 2014 with recurrence in October of 2014.  PRIOR THERAPY: 1) status post left trisegmentectomy with node dissection on 06/24/2004 2) status post 4 cycles of adjuvant chemotherapy with carboplatin and paclitaxel last dose was given 10/24/2004. 3) status post right partial mastectomy on 04/10/2012 under the care of Dr. Brantley Stage. 4)  status post adjuvant radiotherapy to the right breast under the care of Dr. Valere Dross  CURRENT THERAPY: Tamoxifen 20 mg by mouth daily started in May of 2014.  INTERVAL HISTORY: [name] 67 y.o. female returns to the clinic today for six-month follow-up visit.  The patient is feeling fine today with no concerning complaints.  She continues to tolerate her treatment with tamoxifen fairly well except for occasional hot flashes.  She denied having  any chest pain, shortness of breath, cough or hemoptysis.  She denied having any fever or chills.  She has no nausea, vomiting, diarrhea or constipation.  She had a mammogram in July 2019 that was unremarkable.  The patient is here today for evaluation with repeat blood work.  MEDICAL HISTORY: Past Medical History:  Diagnosis Date  . Anxiety   . Breast cancer ([place])    2014 and 2015  . Cancer ([place])    lung cancer   . DDD (degenerative disc disease)    neck  . Eczema   . GERD (gastroesophageal reflux disease)    occasional - no current med.  Marland Kitchen History of breast cancer   . History of lung cancer    non-small cell - left upper lobe  . Osteoarthritis   . Personal history of radiation therapy 2014  . PONV (postoperative nausea and vomiting)   . Radiation 07/03/12-07/09/12   Right upper lung 5400 cGy  . Sinus headache    occasional  . Thyroid nodule, toxic or with hyperthyroidism    no current med.  . Urinary, incontinence, stress female     ALLERGIES:  is allergic to amoxicillin; asa [aspirin]; banana; ceftin [cefuroxime axetil]; lactose intolerance (gi); orange fruit [citrus]; penicillins; and adhesive [tape].  MEDICATIONS:  Current Outpatient Medications  Medication Sig Dispense Refill  . acetaminophen (TYLENOL) 500 MG tablet Take 1,000 mg by mouth every 6 (six) hours as needed (arthritis).    . cyclobenzaprine (FLEXERIL) 10 MG tablet Take 1 tablet by mouth 3 (three) times daily as needed.  0  . cycloSPORINE (RESTASIS) 0.05 % ophthalmic  emulsion 1 drop 2 (two) times daily.    . fluocinonide gel (LIDEX) 0.05 %     . fluticasone (FLONASE) 50 MCG/ACT nasal spray Place 1 spray into both nostrils daily.    Marland Kitchen ibuprofen (ADVIL,MOTRIN) 600 MG tablet Take 600 mg by mouth 2 (two) times daily.  0  . Lactobacillus (ACIDOPHILUS) 0.5 MG TABS Take by mouth.    . meloxicam (MOBIC) 15 MG tablet Take 1 tablet by mouth daily as needed.  0  . Multiple Vitamin (MULTIVITAMIN) capsule Take 1 capsule by mouth.    Marland Kitchen PATANOL 0.1 % ophthalmic solution Place 1 drop into both eyes as needed for allergies. Reported on 04/28/2015    . Polyethyl Glycol-Propyl Glycol (SYSTANE OP) Apply 1 drop to eye as needed (dry eyes). Reported on 04/28/2015    . ranitidine (ZANTAC) 150 MG tablet Take 150 mg by mouth 2 (two) times daily as needed.    . tamoxifen (NOLVADEX) 20 MG tablet TAKE 1 TABLET BY MOUTH EVERY DAY 30 tablet 5  . trimethoprim (TRIMPEX) 100 MG tablet Take 1 tablet by mouth at bedtime.  11  . vitamin E 400 UNIT capsule Take 400 Units by mouth.     No current facility-administered medications for this visit.     SURGICAL HISTORY:  Past Surgical History:  Procedure Laterality Date  . APPENDECTOMY  1971  . BREAST LUMPECTOMY Right 2014  . BREAST RECONSTRUCTION WITH PLACEMENT OF TISSUE EXPANDER AND FLEX HD (ACELLULAR HYDRATED DERMIS) Right 05/20/2013   Procedure: IMMEDIATE RIGHT BREAST RECONSTRUCTION WITH LATISSAMUS MUSCLE FLAP AND PLACEMENT OF TISSUE EXPANDER TO RIGHT BREAST;  Surgeon: Theodoro Kos, DO;  Location: [facility];  Service: Plastics;  Laterality: Right;  . BREAST REDUCTION WITH MASTOPEXY Left 10/22/2013   Procedure: BREAST REDUCTION/MASTOPEXY TO LEFT BREAST WITH LIPOSUCTION/FAT GRAFTING FOR SYMMETRY;  Surgeon: Theodoro Kos, DO;  Location: [facility];  Service: Clinical cytogeneticist;  Laterality: Left;  . DE QUERVAIN'S RELEASE Left 07/31/2002  . MASTECTOMY Right    2015  . MASTECTOMY W/ SENTINEL NODE BIOPSY Right 05/20/2013   Procedure: RIGHT  SIMPLEMASTECTOMY AND SENTINEL NODE   Sampling;  Surgeon: Joyice Faster. Cornett, MD;  Location: [facility];  Service: General;  Laterality: Right;  . MOUTH SURGERY Right may 2014   "keratocystic odotogenic tumor" - X 2  . NASAL SINUS SURGERY  ?1988-1990   "at least 3" (05/21/2013)  . PARTIAL MASTECTOMY WITH NEEDLE LOCALIZATION  04/10/2012   Procedure: PARTIAL MASTECTOMY WITH NEEDLE LOCALIZATION;  Surgeon: Marcello Moores A. Cornett, MD;  Location: [facility];  Service: General;  Laterality: Right;  . REDUCTION MAMMAPLASTY Left 2015  . REMOVAL OF TISSUE EXPANDER AND PLACEMENT OF IMPLANT Right 10/22/2013   Procedure: REMOVAL OF RIGHT TISSUE EXPANDER WITH PLACEMENT OF RIGHT BREAST IMPLANT;  Surgeon: Theodoro Kos, DO;  Location: [facility];  Service: Plastics;  Laterality: Right;  . THORACOTOMY Left 07/14/2004  . TRIGGER FINGER RELEASE Bilateral    "I've had OR on q finger" (05/21/2013)  . TRIGGER FINGER RELEASE Right 07/14/1999   index and little fingers  . TRIGGER FINGER RELEASE Right 07/17/2002   long and ring fingers  . TRIGGER FINGER RELEASE Left 07/31/2002   little, ring and index fingers  . VIDEO ASSISTED THORACOSCOPY (VATS)/ LOBECTOMY Left 07/14/2004   upper tri-segmentectomy  . WISDOM TOOTH EXTRACTION     "3"    REVIEW OF SYSTEMS:  A comprehensive review of systems was negative.   PHYSICAL EXAMINATION: General appearance: alert, cooperative and no distress Head: Normocephalic, without obvious abnormality, atraumatic Neck: no adenopathy Lymph nodes: Cervical, supraclavicular, and axillary nodes normal. Resp: clear to auscultation bilaterally Back: symmetric, no curvature. ROM normal. No CVA tenderness. Cardio: regular rate and rhythm, S1, S2 normal, no murmur, click, rub or gallop GI: soft, non-tender; bowel sounds normal; no masses,  no organomegaly Extremities: extremities normal, atraumatic, no cyanosis or edema  ECOG PERFORMANCE STATUS: 0 - Asymptomatic  Blood  pressure (!) 144/76, pulse 63, temperature 98.2 F (36.8 C), temperature source Oral, resp. rate 18, height 5\' 4"  (1.626 m), weight 162 lb 4.8 oz (73.6 kg), SpO2 100 %.  LABORATORY DATA: Lab Results  Component Value Date   WBC 5.8 12/03/2017   HGB 13.0 12/03/2017   HCT 39.7 12/03/2017   MCV 93.7 12/03/2017   PLT 281 12/03/2017      Chemistry      Component Value Date/Time   NA 146 (H) 12/03/2017 1102   NA 141 10/29/2016 0742   K 4.4 12/03/2017 1102   K 4.1 10/29/2016 0742   CL 110 12/03/2017 1102   CL 108 (H) 04/28/2012 1008   CO2 24 12/03/2017 1102   CO2 26 10/29/2016 0742   BUN 12 12/03/2017 1102   BUN 21.1 10/29/2016 0742   CREATININE 0.82 12/03/2017 1102   CREATININE 0.9 10/29/2016 0742      Component Value Date/Time   CALCIUM 9.4 12/03/2017 1102   CALCIUM 9.0 10/29/2016 0742   ALKPHOS 57 12/03/2017 1102   ALKPHOS 49 10/29/2016 0742   AST 13 (L) 12/03/2017 1102   AST 15 10/29/2016 0742   ALT 10 12/03/2017 1102   ALT 12 10/29/2016 0742   BILITOT 0.5 12/03/2017 1102   BILITOT 0.32 10/29/2016 0742       RADIOGRAPHIC STUDIES: No results found. ASSESSMENT AND PLAN:  This is a very pleasant 67 years old white  female with stage IIA non-small cell lung cancer as well as recurrent breast ductal carcinoma in situ. The patient is status post right lumpectomy followed by adjuvant radiotherapy. She is currently on treatment with tamoxifen and tolerating it fairly well. Repeat CBC, complaints metabolic panel and CA 70.34 were unremarkable today. I recommended for the patient to continue her current treatment with tamoxifen for now. I will see her back for follow-up visit in 6 months for evaluation with repeat lab work and chest x-ray. She was advised to call immediately if she has any concerning symptoms in the interval. All questions were answered. The patient knows to call the clinic with any problems, questions or concerns. We can certainly see the patient much sooner if  necessary. I spent 10 minutes counseling the patient face to face. The total time spent in the appointment was 15 minutes.  Disclaimer: This note was dictated with voice recognition software. Similar sounding words can inadvertently be transcribed and may be missed upon review.

## 2017-12-24 DIAGNOSIS — M1712 Unilateral primary osteoarthritis, left knee: Secondary | ICD-10-CM | POA: Diagnosis not present

## 2017-12-24 DIAGNOSIS — M25562 Pain in left knee: Secondary | ICD-10-CM | POA: Diagnosis not present

## 2017-12-31 DIAGNOSIS — M1712 Unilateral primary osteoarthritis, left knee: Secondary | ICD-10-CM | POA: Diagnosis not present

## 2018-01-07 DIAGNOSIS — M1712 Unilateral primary osteoarthritis, left knee: Secondary | ICD-10-CM | POA: Diagnosis not present

## 2018-01-15 ENCOUNTER — Other Ambulatory Visit: Payer: Self-pay | Admitting: Internal Medicine

## 2018-01-15 DIAGNOSIS — D0511 Intraductal carcinoma in situ of right breast: Secondary | ICD-10-CM

## 2018-01-29 DIAGNOSIS — E042 Nontoxic multinodular goiter: Secondary | ICD-10-CM | POA: Diagnosis not present

## 2018-01-30 DIAGNOSIS — R002 Palpitations: Secondary | ICD-10-CM | POA: Diagnosis not present

## 2018-01-30 DIAGNOSIS — C50111 Malignant neoplasm of central portion of right female breast: Secondary | ICD-10-CM | POA: Diagnosis not present

## 2018-01-30 DIAGNOSIS — R300 Dysuria: Secondary | ICD-10-CM | POA: Diagnosis not present

## 2018-01-30 DIAGNOSIS — Z85118 Personal history of other malignant neoplasm of bronchus and lung: Secondary | ICD-10-CM | POA: Diagnosis not present

## 2018-01-30 DIAGNOSIS — E042 Nontoxic multinodular goiter: Secondary | ICD-10-CM | POA: Diagnosis not present

## 2018-01-31 ENCOUNTER — Other Ambulatory Visit: Payer: Self-pay | Admitting: Family Medicine

## 2018-01-31 DIAGNOSIS — E042 Nontoxic multinodular goiter: Secondary | ICD-10-CM

## 2018-02-04 ENCOUNTER — Ambulatory Visit
Admission: RE | Admit: 2018-02-04 | Discharge: 2018-02-04 | Disposition: A | Discharge status: Home / Self Care | Payer: Medicare Other | Source: Ambulatory Visit | Attending: Family Medicine | Admitting: Family Medicine

## 2018-02-04 DIAGNOSIS — E049 Nontoxic goiter, unspecified: Secondary | ICD-10-CM | POA: Diagnosis not present

## 2018-02-04 DIAGNOSIS — E042 Nontoxic multinodular goiter: Secondary | ICD-10-CM

## 2018-02-05 ENCOUNTER — Other Ambulatory Visit: Payer: Self-pay | Admitting: Family Medicine

## 2018-02-05 DIAGNOSIS — E041 Nontoxic single thyroid nodule: Secondary | ICD-10-CM

## 2018-02-19 ENCOUNTER — Other Ambulatory Visit (HOSPITAL_COMMUNITY)
Admission: RE | Admit: 2018-02-19 | Discharge: 2018-02-19 | Disposition: A | Discharge status: Home / Self Care | Payer: Medicare Other | Source: Ambulatory Visit | Attending: Radiology | Admitting: Radiology

## 2018-02-19 ENCOUNTER — Ambulatory Visit
Admission: RE | Admit: 2018-02-19 | Discharge: 2018-02-19 | Disposition: A | Discharge status: Home / Self Care | Payer: Medicare Other | Source: Ambulatory Visit | Attending: Family Medicine | Admitting: Family Medicine

## 2018-02-19 DIAGNOSIS — E041 Nontoxic single thyroid nodule: Secondary | ICD-10-CM

## 2018-04-04 DIAGNOSIS — R946 Abnormal results of thyroid function studies: Secondary | ICD-10-CM | POA: Diagnosis not present

## 2018-04-04 DIAGNOSIS — E042 Nontoxic multinodular goiter: Secondary | ICD-10-CM | POA: Diagnosis not present

## 2018-04-04 DIAGNOSIS — Z85118 Personal history of other malignant neoplasm of bronchus and lung: Secondary | ICD-10-CM | POA: Diagnosis not present

## 2018-04-04 DIAGNOSIS — Z853 Personal history of malignant neoplasm of breast: Secondary | ICD-10-CM | POA: Diagnosis not present

## 2018-04-04 DIAGNOSIS — Z8349 Family history of other endocrine, nutritional and metabolic diseases: Secondary | ICD-10-CM | POA: Diagnosis not present

## 2018-04-04 DIAGNOSIS — Z803 Family history of malignant neoplasm of breast: Secondary | ICD-10-CM | POA: Diagnosis not present

## 2018-04-07 DIAGNOSIS — R946 Abnormal results of thyroid function studies: Secondary | ICD-10-CM | POA: Diagnosis not present

## 2018-04-07 DIAGNOSIS — E042 Nontoxic multinodular goiter: Secondary | ICD-10-CM | POA: Diagnosis not present

## 2018-04-11 ENCOUNTER — Telehealth: Payer: Self-pay | Admitting: Internal Medicine

## 2018-04-11 NOTE — Telephone Encounter (Signed)
Scheduled appt per 1/16 sch message - unable to reach patient - left message with appt date and time

## 2018-04-17 ENCOUNTER — Encounter: Payer: Self-pay | Admitting: Genetic Counselor

## 2018-04-18 DIAGNOSIS — R1310 Dysphagia, unspecified: Secondary | ICD-10-CM | POA: Diagnosis not present

## 2018-04-18 DIAGNOSIS — Z85118 Personal history of other malignant neoplasm of bronchus and lung: Secondary | ICD-10-CM | POA: Diagnosis not present

## 2018-04-18 DIAGNOSIS — Z803 Family history of malignant neoplasm of breast: Secondary | ICD-10-CM | POA: Diagnosis not present

## 2018-04-18 DIAGNOSIS — Z8349 Family history of other endocrine, nutritional and metabolic diseases: Secondary | ICD-10-CM | POA: Diagnosis not present

## 2018-04-18 DIAGNOSIS — Z853 Personal history of malignant neoplasm of breast: Secondary | ICD-10-CM | POA: Diagnosis not present

## 2018-04-18 DIAGNOSIS — E042 Nontoxic multinodular goiter: Secondary | ICD-10-CM | POA: Diagnosis not present

## 2018-04-18 DIAGNOSIS — R490 Dysphonia: Secondary | ICD-10-CM | POA: Diagnosis not present

## 2018-04-30 ENCOUNTER — Inpatient Hospital Stay: Payer: Medicare Other

## 2018-04-30 ENCOUNTER — Encounter: Payer: Self-pay | Admitting: Genetic Counselor

## 2018-04-30 ENCOUNTER — Inpatient Hospital Stay: Payer: Medicare Other | Attending: Genetic Counselor | Admitting: Genetic Counselor

## 2018-04-30 DIAGNOSIS — Z803 Family history of malignant neoplasm of breast: Secondary | ICD-10-CM

## 2018-04-30 DIAGNOSIS — E049 Nontoxic goiter, unspecified: Secondary | ICD-10-CM | POA: Diagnosis not present

## 2018-04-30 DIAGNOSIS — Z7183 Encounter for nonprocreative genetic counseling: Secondary | ICD-10-CM

## 2018-04-30 DIAGNOSIS — Z800 Family history of malignant neoplasm of digestive organs: Secondary | ICD-10-CM | POA: Diagnosis not present

## 2018-04-30 DIAGNOSIS — K219 Gastro-esophageal reflux disease without esophagitis: Secondary | ICD-10-CM | POA: Diagnosis not present

## 2018-04-30 DIAGNOSIS — D0511 Intraductal carcinoma in situ of right breast: Secondary | ICD-10-CM

## 2018-04-30 DIAGNOSIS — R490 Dysphonia: Secondary | ICD-10-CM | POA: Diagnosis not present

## 2018-04-30 DIAGNOSIS — Z853 Personal history of malignant neoplasm of breast: Secondary | ICD-10-CM | POA: Diagnosis not present

## 2018-04-30 DIAGNOSIS — C3412 Malignant neoplasm of upper lobe, left bronchus or lung: Secondary | ICD-10-CM

## 2018-04-30 NOTE — Progress Notes (Signed)
REFERRING PROVIDER: Delrae Rend, MD [address] [place], [place] [address]  PRIMARY PROVIDER:  [name], MD  PRIMARY REASON FOR VISIT:  1. 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 Family history of breast cancer   3. Family history of stomach cancer   4. Malignant neoplasm of upper lobe of left lung (HCC)      HISTORY OF PRESENT ILLNESS:   [name], a 68 y.o. female, was seen for a [employer] cancer genetics consultation at the request of Dr. Buddy Duty due to a personal and family history of cancer.  [name] presents to clinic today to discuss the possibility of a hereditary predisposition to cancer, genetic testing, and to further clarify her future cancer risks, as well as potential cancer risks for family members.   In 2006, at the age of 63, [name] was diagnosed with cancer of the left lung. This was treated with surgery and radiation.  In 2014, at the age of 68, [name] was diagnosed with cancer of the right breast.  She was diagnosed with a second breast cancer 6 months later at 68.  This was treated with a mastectomy.   Over her lifetime, she reports having several tumors including nodules on the tendons of each finger, 3 thyroid nodules resulting in a multinodular goiter, sinus polyps and cysts and a tumor that grew out of her wisdom tooth that was fast growing and needed to have part of her jaw removed and cadaver bone put in place.     CANCER HISTORY:   No history exists.     HORMONAL RISK FACTORS:  Menarche was at age 46.  First live birth at age 60.  OCP use for approximately 10 years.  Ovaries intact: yes.  Hysterectomy: no.  Menopausal status: postmenopausal.  HRT use: 0 years. Colonoscopy: yes; normal. Mammogram within the last year: yes. Number of breast biopsies: 1. Up to date with pelvic exams:  yes. Any excessive radiation exposure in the past:   yes  Past Medical History:  Diagnosis Date  . Anxiety   . Breast cancer ([facility])    2014 and 2015  . Cancer ([place])    lung cancer   . DDD (degenerative disc disease)    neck  . Eczema   . Family history of breast cancer   . Family history of stomach cancer   . GERD (gastroesophageal reflux disease)    occasional - no current med.  Marland Kitchen History of breast cancer   . History of lung cancer    non-small cell - left upper lobe  . Osteoarthritis   . Personal history of radiation therapy 2014  . PONV (postoperative nausea and vomiting)   . Radiation 07/03/12-07/09/12   Right upper lung 5400 cGy  . Sinus headache    occasional  . Thyroid nodule, toxic or with hyperthyroidism    no current med.  . Urinary, incontinence, stress female     Past Surgical History:  Procedure Laterality Date  . APPENDECTOMY  1971  . BREAST LUMPECTOMY Right 2014  . BREAST RECONSTRUCTION WITH PLACEMENT OF TISSUE EXPANDER AND FLEX HD (ACELLULAR HYDRATED DERMIS) Right 05/20/2013   Procedure: IMMEDIATE RIGHT BREAST RECONSTRUCTION WITH LATISSAMUS MUSCLE FLAP AND PLACEMENT OF TISSUE EXPANDER TO RIGHT BREAST;  Surgeon: Theodoro Kos, DO;  Location: [facility];  Service: Plastics;  Laterality: Right;  . BREAST REDUCTION WITH MASTOPEXY Left  10/22/2013   Procedure: BREAST REDUCTION/MASTOPEXY TO LEFT BREAST WITH LIPOSUCTION/FAT GRAFTING FOR SYMMETRY;  Surgeon: Theodoro Kos, DO;  Location: [facility];  Service: Plastics;  Laterality: Left;  . DE QUERVAIN'S RELEASE Left 07/31/2002  . MASTECTOMY Right    2015  . MASTECTOMY W/ SENTINEL NODE BIOPSY Right 05/20/2013   Procedure: RIGHT SIMPLEMASTECTOMY AND SENTINEL NODE   Sampling;  Surgeon: Joyice Faster. Cornett, MD;  Location: [facility];  Service: General;  Laterality: Right;  . MOUTH SURGERY Right may 2014   "keratocystic odotogenic tumor" - X 2  . NASAL SINUS SURGERY  ?1988-1990   "at least 3" (05/21/2013)  . PARTIAL MASTECTOMY WITH NEEDLE LOCALIZATION  04/10/2012    Procedure: PARTIAL MASTECTOMY WITH NEEDLE LOCALIZATION;  Surgeon: Marcello Moores A. Cornett, MD;  Location: [facility];  Service: General;  Laterality: Right;  . REDUCTION MAMMAPLASTY Left 2015  . REMOVAL OF TISSUE EXPANDER AND PLACEMENT OF IMPLANT Right 10/22/2013   Procedure: REMOVAL OF RIGHT TISSUE EXPANDER WITH PLACEMENT OF RIGHT BREAST IMPLANT;  Surgeon: Theodoro Kos, DO;  Location: [facility];  Service: Plastics;  Laterality: Right;  . THORACOTOMY Left 07/14/2004  . TRIGGER FINGER RELEASE Bilateral    "I've had OR on q finger" (05/21/2013)  . TRIGGER FINGER RELEASE Right 07/14/1999   index and little fingers  . TRIGGER FINGER RELEASE Right 07/17/2002   long and ring fingers  . TRIGGER FINGER RELEASE Left 07/31/2002   little, ring and index fingers  . VIDEO ASSISTED THORACOSCOPY (VATS)/ LOBECTOMY Left 07/14/2004   upper tri-segmentectomy  . WISDOM TOOTH EXTRACTION     "3"    Social History   Socioeconomic History  . Marital status: Married    Spouse name: Not on file  . Number of children: 1  . Years of education: Not on file  . Highest education level: Not on file  Occupational History    Employer: [employer]  Social Needs  . Financial resource strain: Not on file  . Food insecurity:    Worry: Not on file    Inability: Not on file  . Transportation needs:    Medical: Not on file    Non-medical: Not on file  Tobacco Use  . Smoking status: Former Smoker    Last attempt to quit: 05/06/2004    Years since quitting: 13.9  . Smokeless tobacco: Never Used  Substance and Sexual Activity  . Alcohol use: Yes    Comment: 1 glass wine daily  . Drug use: No  . Sexual activity: Yes  Lifestyle  . Physical activity:    Days per week: Not on file    Minutes per session: Not on file  . Stress: Not on file  Relationships  . Social connections:    Talks on phone: Not on file    Gets together: Not on file    Attends religious service: Not on file     Active member of club or organization: Not on file    Attends meetings of clubs or organizations: Not on file    Relationship status: Not on file  Other Topics Concern  . Not on file  Social History Narrative  . Not on file     FAMILY HISTORY:  We obtained a detailed, 4-generation family history.  Significant diagnoses are listed below: Family History  Problem Relation Age of Onset  . Breast cancer Sister        BRCA testing in 2006 timeframe is neg  .  Thyroid nodules Sister   . Breast cancer Maternal Aunt        dx >50  . Thyroid nodules Maternal Aunt   . Anesthesia problems Daughter        post-op N/V  . Leukemia Father        CML, d. 38s  . Anesthesia problems Mother        post-op N/V  . Thyroid nodules Mother   . Thyroid nodules Sister   . Stomach cancer Maternal Grandmother   . Thyroid nodules Maternal Aunt   . Stomach cancer Maternal Great-grandmother        assumed stomach cancer    The patient has one daughter who is cancer free.  She has one sister who had breast cancer and one brother who is cancer free.  Her sister has thyroid nodules, and also had genetic testing around 2006 and was negative for BRCA mutations.  Her mother is living and her father is deceased.    The patient's mother is 69 and cancer free.  She has a history of thyroid nodules.  She had two sisters and a brother.  One sister had breast cancer and both sisters had thyroid nodules.The maternal grandparents are deceased.  The grandmother had stomach cancer.  The patient's father had leukemia - CML, and died in his 25's.  He had 6 siblings, one had a 'blood thing' going on and died.  One sister had a daughter with breast cancer who died at 32.  There I no other reported family history of cancer.  [name] is unaware of previous family history of genetic testing for hereditary cancer risks. Patient's maternal ancestors are of [place] descent, and paternal ancestors are of [place] descent. There is  no reported Ashkenazi Jewish ancestry. There is no known consanguinity.  GENETIC COUNSELING ASSESSMENT: BRENDALY TOWNSEL is a 68 y.o. female with a personal and family history of cancer which is somewhat suggestive of a hereditary cancer syndrome and predisposition to cancer. We, therefore, discussed and recommended the following at today's visit.   DISCUSSION: We discussed that about 5-10% of breast cancer is hereditary with most cases due to BRCA mutations.  There are other genes that can increase the risk for breast cancer.  There is one gene, EGFR, that can be associated with hereditary lung cancers.  However, she was a smoker at the time of diagnosis, and most likely it was due to that exposure.  We discussed that we are not sure that we will be able to identify a cause for all of the growths that she has experienced.    We reviewed the characteristics, features and inheritance patterns of hereditary cancer syndromes. We also discussed genetic testing, including the appropriate family members to test, the process of testing, insurance coverage and turn-around-time for results. We discussed the implications of a negative, positive and/or variant of uncertain significant result. We recommended [name] pursue genetic testing for the multi cancer gene panel. The Multi-Gene Panel offered by [facility] includes sequencing and/or deletion duplication testing of the following 84 genes: AIP, ALK, APC, ATM, AXIN2,BAP1,  BARD1, BLM, BMPR1A, BRCA1, BRCA2, BRIP1, CASR, CDC73, CDH1, CDK4, CDKN1B, CDKN1C, CDKN2A (p14ARF), CDKN2A (p16INK4a), CEBPA, CHEK2, CTNNA1, DICER1, DIS3L2, EGFR (c.2369C>T, p.Thr790Met variant only), EPCAM (Deletion/duplication testing only), FH, FLCN, GATA2, GPC3, GREM1 (Promoter region deletion/duplication testing only), HOXB13 (c.251G>A, p.Gly84Glu), HRAS, KIT, MAX, MEN1, MET, MITF (c.952G>A, p.Glu318Lys variant only), MLH1, MSH2, MSH3, MSH6, MUTYH, NBN, NF1, NF2, NTHL1, PALB2, PDGFRA, PHOX2B,  PMS2, POLD1,  POLE, POT1, PRKAR1A, PTCH1, PTEN, RAD50, RAD51C, RAD51D, RB1, RECQL4, RET, RUNX1, SDHAF2, SDHA (sequence changes only), SDHB, SDHC, SDHD, SMAD4, SMARCA4, SMARCB1, SMARCE1, STK11, SUFU, TERC, TERT, TMEM127, TP53, TSC1, TSC2, VHL, WRN and WT1.    Based on Ms. Gemmill personal and family history of cancer, she meets medical criteria for genetic testing. Despite that she meets criteria, she may still have an out of pocket cost. We discussed that if her out of pocket cost for testing is over $100, the laboratory will call and confirm whether she wants to proceed with testing.  If the out of pocket cost of testing is less than $100 she will be billed by the genetic testing laboratory.   PLAN: After considering the risks, benefits, and limitations, [name]  provided informed consent to pursue genetic testing and the blood sample was sent to [facility] for analysis of the multi cancer gene panel. Results should be available within approximately 2-3 weeks' time, at which point they will be disclosed by telephone to [name], as will any additional recommendations warranted by these results. [name] will receive a summary of her genetic counseling visit and a copy of her results once available. This information will also be available in Epic. We encouraged [name] to remain in contact with cancer genetics annually so that we can continuously update the family history and inform her of any changes in cancer genetics and testing that may be of benefit for her family. [name] questions were answered to her satisfaction today. Our contact information was provided should additional questions or concerns arise.  Lastly, we encouraged [name] to remain in contact with cancer genetics annually so that we can continuously update the family history and inform her of any changes in cancer genetics and testing that may be of benefit for this family.   [name] questions were  answered to her satisfaction today. Our contact information was provided should additional questions or concerns arise. Thank you for the referral and allowing [place] to share in the care of your patient.   [name] P. Florene Glen, [place], [facility] Certified Genetic Counselor [email or web address] phone: [phone]  The patient was seen for a total of 45 minutes in face-to-face genetic counseling.  This patient was discussed with Drs. Magrinat, Lindi Adie and/or Burr Medico who agrees with the above.    _______________________________________________________________________ For Office Staff:  Number of people involved in session: 1 Was an Intern/ student involved with case: yes: Vanita Panda

## 2018-05-07 ENCOUNTER — Telehealth: Payer: Self-pay | Admitting: Genetic Counselor

## 2018-05-07 NOTE — Telephone Encounter (Signed)
Revealed negative genetic testing.  Discussed that we do not know why she has breast and lung cancer or why there is cancer in the family. It could be due to a different gene that we are not testing, or maybe our current technology may not be able to pick something up.  It will be important for her to keep in contact with genetics to keep up with whether additional testing may be needed.   Revealed two VUS.  These are still normal results and we will not change medical management.

## 2018-05-09 ENCOUNTER — Ambulatory Visit: Payer: Self-pay | Admitting: Genetic Counselor

## 2018-05-09 DIAGNOSIS — Z1379 Encounter for other screening for genetic and chromosomal anomalies: Secondary | ICD-10-CM

## 2018-05-09 NOTE — Progress Notes (Signed)
HPI:  [name] was previously seen in the [place] clinic due to a personal and family history of cancer and concerns regarding a hereditary predisposition to cancer. Please refer to our prior cancer genetics clinic note for more information regarding [name]'s medical, social and family histories, and our assessment and recommendations, at the time. [name] recent genetic test results were disclosed to her, as were recommendations warranted by these results. These results and recommendations are discussed in more detail below.  CANCER HISTORY:    Lung cancer, diagnosed in March of 2006 stage II a status post left trisegmentectomy with lymph node dissection followed by 4 cycles of adjuvant chemotherapy   04/28/2012 Initial Diagnosis    Lung cancer, diagnosed in March of 2006 stage II a status post left trisegmentectomy with lymph node dissection followed by 4 cycles of adjuvant chemotherapy    05/06/2018 Genetic Testing    MEN1 c.875C>T and MSH3 c.1366G>A VUS identified on the multicancer gene panel.  The Multi-Gene Panel offered by [facility] includes sequencing and/or deletion duplication testing of the following 84 genes: AIP, ALK, APC, ATM, AXIN2,BAP1,  BARD1, BLM, BMPR1A, BRCA1, BRCA2, BRIP1, CASR, CDC73, CDH1, CDK4, CDKN1B, CDKN1C, CDKN2A (p14ARF), CDKN2A (p16INK4a), CEBPA, CHEK2, CTNNA1, DICER1, DIS3L2, EGFR (c.2369C>T, p.Thr790Met variant only), EPCAM (Deletion/duplication testing only), FH, FLCN, GATA2, GPC3, GREM1 (Promoter region deletion/duplication testing only), HOXB13 (c.251G>A, p.Gly84Glu), HRAS, KIT, MAX, MEN1, MET, MITF (c.952G>A, p.Glu318Lys variant only), MLH1, MSH2, MSH3, MSH6, MUTYH, NBN, NF1, NF2, NTHL1, PALB2, PDGFRA, PHOX2B, PMS2, POLD1, POLE, POT1, PRKAR1A, PTCH1, PTEN, RAD50, RAD51C, RAD51D, RB1, RECQL4, RET, RUNX1, SDHAF2, SDHA (sequence changes only), SDHB, SDHC, SDHD, SMAD4, SMARCA4, SMARCB1, SMARCE1, STK11, SUFU, TERC, TERT, TMEM127, TP53, TSC1, TSC2,  VHL, WRN and WT1.  The report date is May 06, 2018.     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  The Multi-Gene Panel offered by [facility] includes sequencing and/or deletion duplication testing of the following 84 genes: AIP, ALK, APC, ATM, AXIN2,BAP1,  BARD1, BLM, BMPR1A, BRCA1, BRCA2, BRIP1, CASR, CDC73, CDH1, CDK4, CDKN1B, CDKN1C, CDKN2A (p14ARF), CDKN2A (p16INK4a), CEBPA, CHEK2, CTNNA1, DICER1, DIS3L2, EGFR (c.2369C>T, p.Thr790Met variant only), EPCAM (Deletion/duplication testing only), FH, FLCN, GATA2, GPC3, GREM1 (Promoter region deletion/duplication testing only), HOXB13 (c.251G>A, p.Gly84Glu), HRAS, KIT, MAX, MEN1, MET, MITF (c.952G>A, p.Glu318Lys variant only), MLH1, MSH2, MSH3, MSH6, MUTYH, NBN, NF1, NF2, NTHL1, PALB2, PDGFRA, PHOX2B, PMS2, POLD1, POLE, POT1, PRKAR1A, PTCH1, PTEN, RAD50, RAD51C, RAD51D, RB1, RECQL4, RET, RUNX1, SDHAF2, SDHA (sequence changes only), SDHB, SDHC, SDHD, SMAD4, SMARCA4, SMARCB1, SMARCE1, STK11, SUFU, TERC, TERT, TMEM127, TP53, TSC1, TSC2, VHL, WRN and WT1.  The report date is May 06, 2018.     FAMILY HISTORY:  We obtained a detailed, 4-generation family history.  Significant diagnoses are listed below: Family History  Problem Relation Age of Onset  . Breast cancer Sister        BRCA testing in 2006 timeframe is neg  . Thyroid nodules Sister   . Breast cancer Maternal Aunt        dx >50  . Thyroid nodules Maternal Aunt   . Anesthesia problems Daughter  post-op N/V  . Leukemia Father        CML, d. 14s  . Anesthesia problems Mother        post-op N/V  . Thyroid nodules Mother   . Thyroid nodules  Sister   . Stomach cancer Maternal Grandmother   . Thyroid nodules Maternal Aunt   . Stomach cancer Maternal Great-grandmother        assumed stomach cancer    The patient has one daughter who is cancer free.  She has one sister who had breast cancer and one brother who is cancer free.  Her sister has thyroid nodules, and also had genetic testing around 2006 and was negative for BRCA mutations.  Her mother is living and her father is deceased.    The patient's mother is 31 and cancer free.  She has a history of thyroid nodules.  She had two sisters and a brother.  One sister had breast cancer and both sisters had thyroid nodules.The maternal grandparents are deceased.  The grandmother had stomach cancer.  The patient's father had leukemia - CML, and died in his 92's.  He had 6 siblings, one had a 'blood thing' going on and died.  One sister had a daughter with breast cancer who died at 53.  There I no other reported family history of cancer.  [name] is unaware of previous family history of genetic testing for hereditary cancer risks. Patient's maternal ancestors are of [place] descent, and paternal ancestors are of [place] descent. There is no reported Ashkenazi Jewish ancestry. There is no known consanguinity.    GENETIC TEST RESULTS: Genetic testing reported out on May 06, 2018 through the multi--cancer panel found no deleterious mutations.  The Multi-Gene Panel offered by [facility] includes sequencing and/or deletion duplication testing of the following 84 genes: AIP, ALK, APC, ATM, AXIN2,BAP1,  BARD1, BLM, BMPR1A, BRCA1, BRCA2, BRIP1, CASR, CDC73, CDH1, CDK4, CDKN1B, CDKN1C, CDKN2A (p14ARF), CDKN2A (p16INK4a), CEBPA, CHEK2, CTNNA1, DICER1, DIS3L2, EGFR (c.2369C>T, p.Thr790Met variant only), EPCAM (Deletion/duplication testing only), FH, FLCN, GATA2, GPC3, GREM1 (Promoter region deletion/duplication testing only), HOXB13 (c.251G>A, p.Gly84Glu), HRAS, KIT, MAX, MEN1, MET, MITF (c.952G>A,  p.Glu318Lys variant only), MLH1, MSH2, MSH3, MSH6, MUTYH, NBN, NF1, NF2, NTHL1, PALB2, PDGFRA, PHOX2B, PMS2, POLD1, POLE, POT1, PRKAR1A, PTCH1, PTEN, RAD50, RAD51C, RAD51D, RB1, RECQL4, RET, RUNX1, SDHAF2, SDHA (sequence changes only), SDHB, SDHC, SDHD, SMAD4, SMARCA4, SMARCB1, SMARCE1, STK11, SUFU, TERC, TERT, TMEM127, TP53, TSC1, TSC2, VHL, WRN and WT1.   The test report has been scanned into EPIC and is located under the Molecular Pathology section of the Results Review tab.    We discussed with [name] that since the current genetic testing is not perfect, it is possible there may be a gene mutation in one of these genes that current testing cannot detect, but that chance is small.  We also discussed, that it is possible that another gene that has not yet been discovered, or that we have not yet tested, is responsible for the cancer diagnoses in the family, and it is, therefore, important to remain in touch with cancer genetics in the future so that we can continue to offer [name] the most up to date genetic testing.   Genetic testing did detect two Variants of Unknown Significance - one in the MSH3 gene called c.1366G>A and the other in the MEN1 gene called c.875C>T. At this time, it is unknown if these variants are associated with increased cancer risk or if they are a normal finding, but  most variants such as these get reclassified to being inconsequential. They should not be used to make medical management decisions. With time, we suspect the lab will determine the significance of these variants, if any. If we do learn more about it, we will try to contact [name] to discuss it further. However, it is important to stay in touch with [place] periodically and keep the address and phone number up to date.     CANCER SCREENING RECOMMENDATIONS: This result is reassuring and indicates that [name] likely does not have an increased risk for a future cancer due to a mutation in one of these genes.  This normal test also suggests that [name] cancer was most likely not due to an inherited predisposition associated with one of these genes.  Most cancers happen by chance and this negative test suggests that her cancer falls into this category.  We, therefore, recommended she continue to follow the cancer management and screening guidelines provided by her oncology and primary healthcare provider.   An individual's cancer risk and medical management are not determined by genetic test results alone. Overall cancer risk assessment incorporates additional factors, including personal medical history, family history, and any available genetic information that may result in a personalized plan for cancer prevention and surveillance.  RECOMMENDATIONS FOR FAMILY MEMBERS:  Women in this family might be at some increased risk of developing cancer, over the general population risk, simply due to the family history of cancer.  We recommended women in this family have a yearly mammogram beginning at age 52, or 27 years younger than the earliest onset of cancer, an annual clinical breast exam, and perform monthly breast self-exams. Women in this family should also have a gynecological exam as recommended by their primary provider. All family members should have a colonoscopy by age 43.  FOLLOW-UP: Lastly, we discussed with [name] that cancer genetics is a rapidly advancing field and it is possible that new genetic tests will be appropriate for her and/or her family members in the future. We encouraged her to remain in contact with cancer genetics on an annual basis so we can update her personal and family histories and let her know of advances in cancer genetics that may benefit this family.   Our contact number was provided. [name] questions were answered to her satisfaction, and she knows she is welcome to call us at anytime with additional questions or concerns.   Roma Kayser, MS, [facility] Certified  Genetic Counselor [email or web address]

## 2018-05-22 DIAGNOSIS — D34 Benign neoplasm of thyroid gland: Secondary | ICD-10-CM | POA: Diagnosis not present

## 2018-05-22 DIAGNOSIS — E049 Nontoxic goiter, unspecified: Secondary | ICD-10-CM | POA: Diagnosis not present

## 2018-05-29 ENCOUNTER — Telehealth: Payer: Self-pay | Admitting: Internal Medicine

## 2018-05-29 NOTE — Telephone Encounter (Signed)
MM PAL 3/24 moved f/u from 3/24 to 4/6. Spoke with patient. Per patient cannot come 4/6. Patient will keep lab/cxr 3/17 and see MM at next available 4/22. Patient has appointment dates/times.

## 2018-06-05 ENCOUNTER — Other Ambulatory Visit: Payer: Self-pay | Admitting: Otolaryngology

## 2018-06-05 DIAGNOSIS — D34 Benign neoplasm of thyroid gland: Secondary | ICD-10-CM

## 2018-06-05 DIAGNOSIS — E049 Nontoxic goiter, unspecified: Secondary | ICD-10-CM

## 2018-06-10 ENCOUNTER — Other Ambulatory Visit: Payer: Self-pay

## 2018-06-10 ENCOUNTER — Inpatient Hospital Stay: Payer: Medicare Other | Attending: Family Medicine

## 2018-06-10 ENCOUNTER — Ambulatory Visit (HOSPITAL_COMMUNITY)
Admission: RE | Admit: 2018-06-10 | Discharge: 2018-06-10 | Disposition: A | Discharge status: Home / Self Care | Payer: Medicare Other | Source: Ambulatory Visit | Attending: Internal Medicine | Admitting: Internal Medicine

## 2018-06-10 DIAGNOSIS — C50911 Malignant neoplasm of unspecified site of right female breast: Secondary | ICD-10-CM | POA: Diagnosis not present

## 2018-06-10 DIAGNOSIS — Z85118 Personal history of other malignant neoplasm of bronchus and lung: Secondary | ICD-10-CM | POA: Diagnosis not present

## 2018-06-10 DIAGNOSIS — R059 Cough, unspecified: Secondary | ICD-10-CM

## 2018-06-10 DIAGNOSIS — Z86000 Personal history of in-situ neoplasm of breast: Secondary | ICD-10-CM | POA: Diagnosis not present

## 2018-06-10 DIAGNOSIS — C3412 Malignant neoplasm of upper lobe, left bronchus or lung: Secondary | ICD-10-CM | POA: Diagnosis not present

## 2018-06-10 DIAGNOSIS — C50111 Malignant neoplasm of central portion of right female breast: Secondary | ICD-10-CM

## 2018-06-10 DIAGNOSIS — Z170 Estrogen receptor positive status [ER+]: Secondary | ICD-10-CM | POA: Insufficient documentation

## 2018-06-10 DIAGNOSIS — R05 Cough: Secondary | ICD-10-CM | POA: Diagnosis not present

## 2018-06-10 DIAGNOSIS — Z9889 Other specified postprocedural states: Secondary | ICD-10-CM | POA: Insufficient documentation

## 2018-06-10 DIAGNOSIS — C3492 Malignant neoplasm of unspecified part of left bronchus or lung: Secondary | ICD-10-CM | POA: Diagnosis not present

## 2018-06-10 LAB — CMP (CANCER CENTER ONLY)
ALT: 13 U/L (ref 0–44)
ANION GAP: 12 (ref 5–15)
AST: 14 U/L — ABNORMAL LOW (ref 15–41)
Albumin: 3.3 g/dL — ABNORMAL LOW (ref 3.5–5.0)
Alkaline Phosphatase: 68 U/L (ref 38–126)
BUN: 17 mg/dL (ref 8–23)
CO2: 23 mmol/L (ref 22–32)
Calcium: 9.3 mg/dL (ref 8.9–10.3)
Chloride: 108 mmol/L (ref 98–111)
Creatinine: 0.84 mg/dL (ref 0.44–1.00)
GFR, Est AFR Am: >60 mL/min (ref >60)
GFR, Estimated: >60 mL/min (ref >60)
GLUCOSE: 101 mg/dL — AB (ref 70–99)
Potassium: 4.7 mmol/L (ref 3.5–5.1)
Sodium: 143 mmol/L (ref 135–145)
Total Bilirubin: 0.4 mg/dL (ref 0.3–1.2)
Total Protein: 6.8 g/dL (ref 6.5–8.1)

## 2018-06-10 LAB — CBC WITH DIFFERENTIAL (CANCER CENTER ONLY)
Abs Immature Granulocytes: 0.02 10*3/uL (ref 0.00–0.07)
Basophils Absolute: 0.1 10*3/uL (ref 0.0–0.1)
Basophils Relative: 1 %
Eosinophils Absolute: 0.4 10*3/uL (ref 0.0–0.5)
Eosinophils Relative: 7 %
HCT: 41.1 % (ref 36.0–46.0)
Hemoglobin: 13 g/dL (ref 12.0–15.0)
IMMATURE GRANULOCYTES: 0 %
Lymphocytes Relative: 28 %
Lymphs Abs: 1.7 10*3/uL (ref 0.7–4.0)
MCH: 30 pg (ref 26.0–34.0)
MCHC: 31.6 g/dL (ref 30.0–36.0)
MCV: 94.9 fL (ref 80.0–100.0)
Monocytes Absolute: 0.7 10*3/uL (ref 0.1–1.0)
Monocytes Relative: 11 %
NEUTROS PCT: 53 %
Neutro Abs: 3.2 10*3/uL (ref 1.7–7.7)
Platelet Count: 326 10*3/uL (ref 150–400)
RBC: 4.33 MIL/uL (ref 3.87–5.11)
RDW: 13.8 % (ref 11.5–15.5)
WBC Count: 6.1 10*3/uL (ref 4.0–10.5)
nRBC: 0 % (ref 0.0–0.2)

## 2018-06-10 IMAGING — CR CHEST  1 VIEW
1 series · 1 of 1 positions shown · non-contrast
Comparison: [DATE].  [DATE].

CLINICAL DATA: Left lung cancer.  Right breast cancer.

EXAM:
CHEST  1 VIEW

[w chest pa]
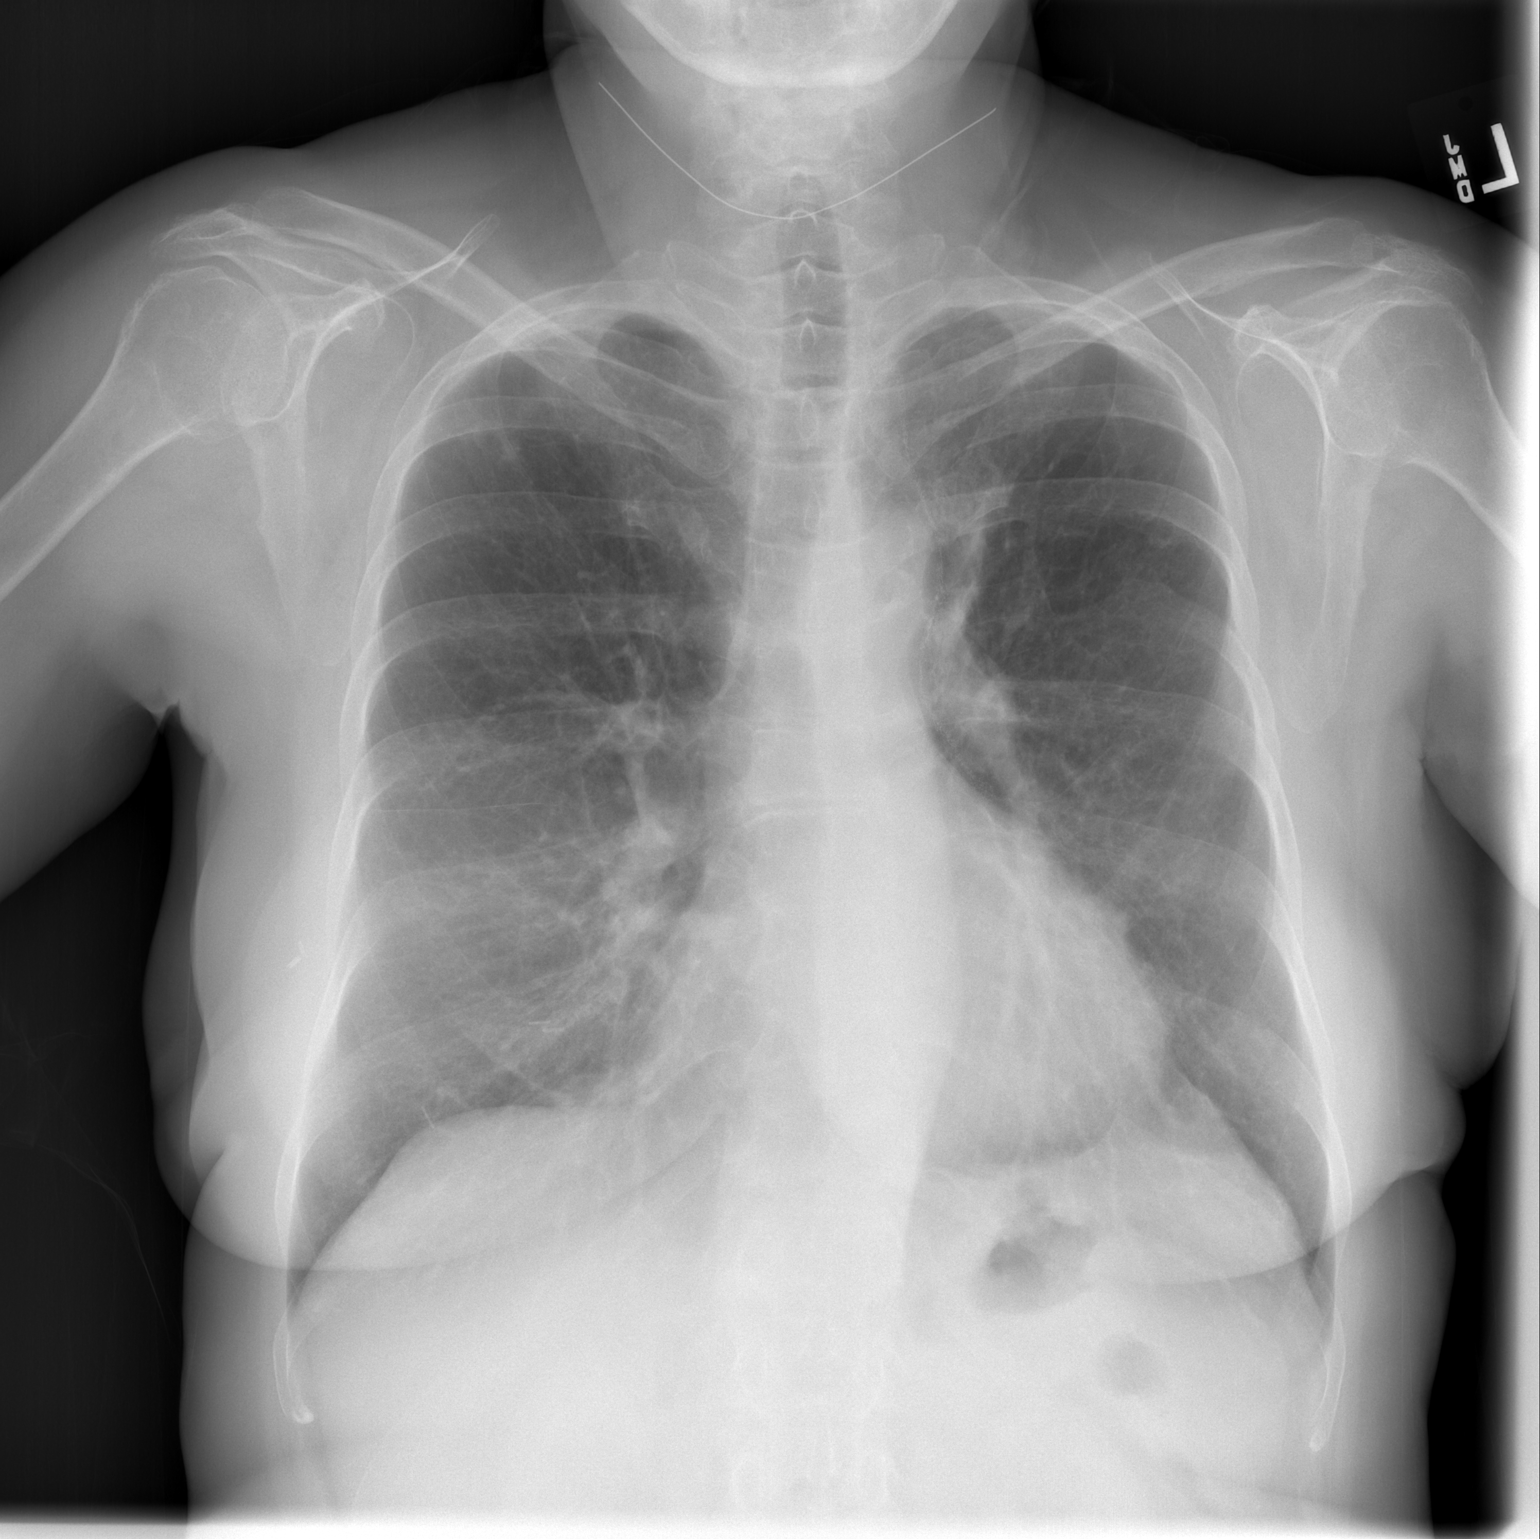

[1 of 1 positions shown; findings below may reference images not displayed]

FINDINGS: Mediastinum and hilar structures normal. No focal alveolar
infiltrate. Nodular opacity right upper lung is stable from multiple
prior exams most consistent with a benign granuloma. No pleural
effusion pneumothorax. Heart size stable. Degenerative change
thoracic spine.
IMPRESSION: 1. Stable nodular opacity right upper lung without interim change
from multiple prior exams. Findings most consistent with a small
granuloma.

2.  No acute cardiopulmonary disease.

## 2018-06-11 ENCOUNTER — Ambulatory Visit
Admission: RE | Admit: 2018-06-11 | Discharge: 2018-06-11 | Disposition: A | Discharge status: Home / Self Care | Payer: Medicare Other | Source: Ambulatory Visit | Attending: Otolaryngology | Admitting: Otolaryngology

## 2018-06-11 DIAGNOSIS — E042 Nontoxic multinodular goiter: Secondary | ICD-10-CM | POA: Diagnosis not present

## 2018-06-11 DIAGNOSIS — D34 Benign neoplasm of thyroid gland: Secondary | ICD-10-CM

## 2018-06-11 DIAGNOSIS — E049 Nontoxic goiter, unspecified: Secondary | ICD-10-CM

## 2018-06-11 LAB — CANCER ANTIGEN 27.29: CAN 27.29: 43.6 U/mL — AB (ref 0.0–38.6)

## 2018-06-11 IMAGING — CT CT NECK WITH CONTRAST
2 of 3 series · 7 of 14 positions shown, 8 images · IV contrast (iopamidol)
Comparison: Thyroid ultrasound [DATE], chest CT [DATE], and
PET-CT [DATE]

CLINICAL DATA: Thyroid goiter. History of lung cancer and breast
cancer.

EXAM:
CT NECK WITH CONTRAST
TECHNIQUE: Multidetector CT imaging of the neck was performed using the
standard protocol following the bolus administration of intravenous
contrast.
CONTRAST:  75mL [R7] IOPAMIDOL ([R7]) INJECTION 61%

[Series 3: neck · axial · 0.49mm/px · z∈[-266,-138]mm · 3 of 129 slices shown]
[im 33/129  bone]
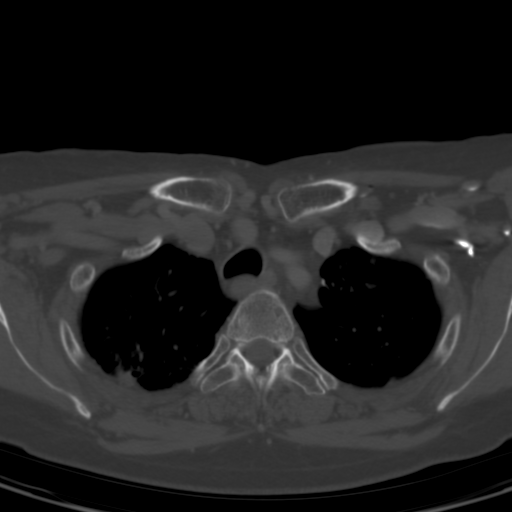
[im 65/129  bone]
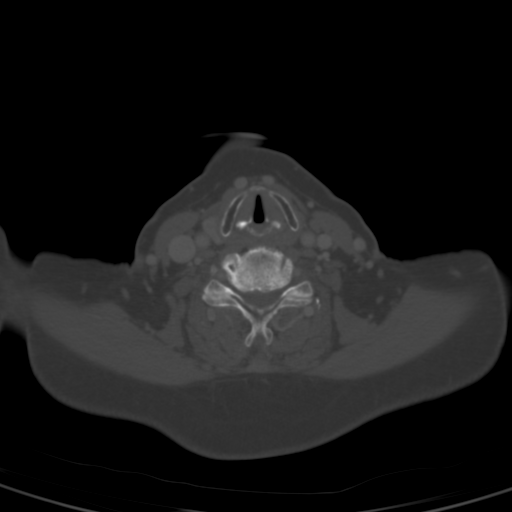
[im 97/129  bone]
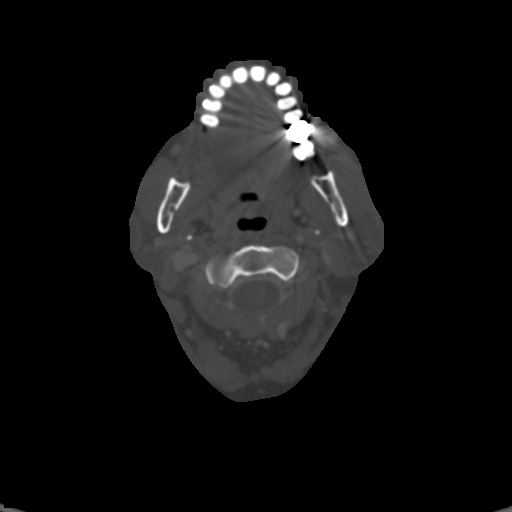

[Series 8: angled axial-oropharynx · axial · 0.50mm/px · z∈[-298,-140]mm · 4 of 134 slices shown, 5 images]
[im 27/134  soft-tissue]
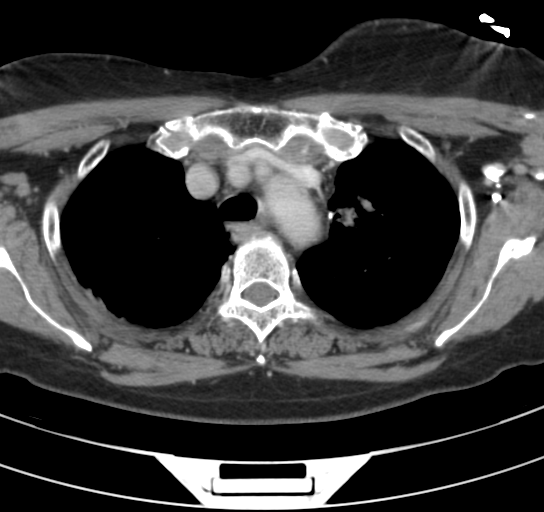
[im 27/134  bone]
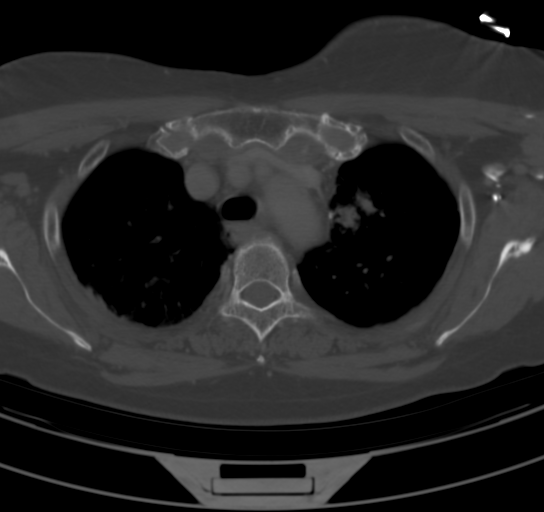
[im 54/134  bone]
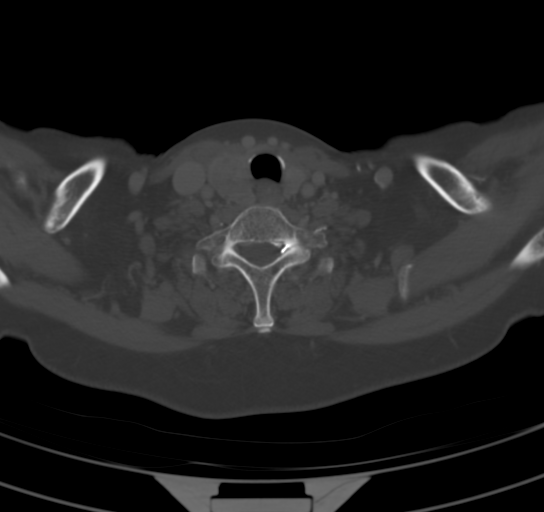
[im 80/134  bone]
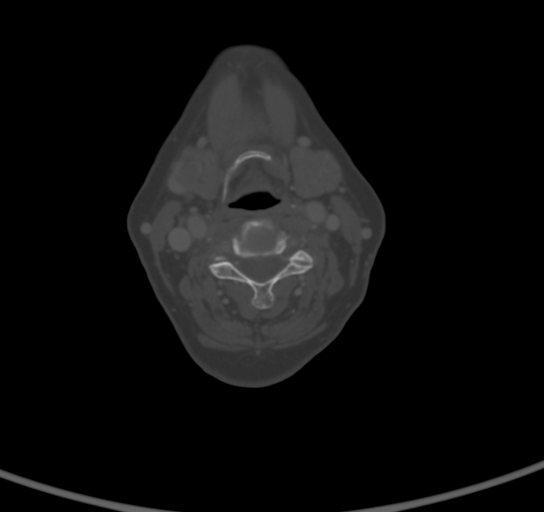
[im 107/134  bone]
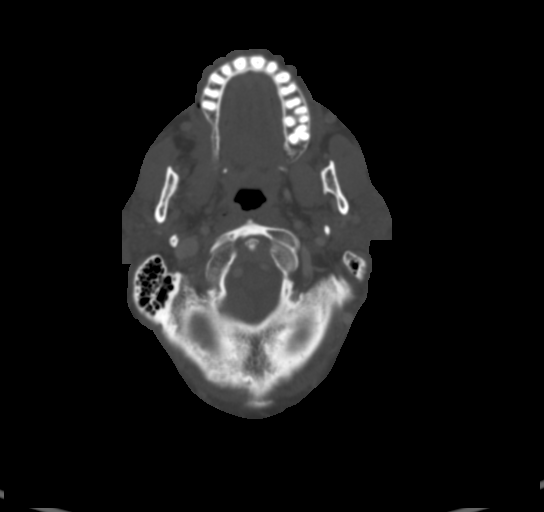

[7 of 14 positions shown; findings below may reference images not displayed]

FINDINGS: Pharynx and larynx: Symmetric pharyngeal soft tissue contours
without evidence of mass or swelling. Unremarkable larynx.

Salivary glands: No inflammation, mass, or stone.

Thyroid: Moderate asymmetric enlargement of the right thyroid lobe
with multiple underlying nodules including a dominant 2.6 cm nodule,
more fully evaluated previously by ultrasound and FNA. Slight
leftward tracheal deviation without airway narrowing.

Lymph nodes: No enlarged or suspicious lymph nodes in the neck.

Vascular: Mild aortic and carotid atherosclerosis. Major vascular
structures of the neck are patent.

Limited intracranial: Unremarkable.

Visualized orbits: Unremarkable.

Mastoids and visualized paranasal sinuses: Evidence of chronic
sinusitis with postsurgical changes involving both maxillary
sinuses. Mild mucosal thickening in the ethmoid and sphenoid
sinuses. Clear mastoid air cells.

Skeleton: Advanced disc degeneration at C4-5 and C5-6 with bilateral
neural foraminal stenosis due to uncovertebral spurring as well as
spinal stenosis which is moderate to severe at C5-6. No suspicious
osseous lesion.

Upper chest: Mild centrilobular emphysema. Postsurgical changes from
left upper lobe resection with increased lung opacity and volume
loss medially along the suture line compared to the [R7] CT,
incompletely evaluated though likely reflecting progressive post
treatment changes.

Other: None.
IMPRESSION: 1. Multinodular right thyroid lobe, previously evaluated with
ultrasound and FNA.
2. No evidence of cervical lymphadenopathy.
3. Post treatment changes in the left lung.
4.  Aortic Atherosclerosis ([R7]-[R7]).

## 2018-06-11 MED ORDER — IOPAMIDOL (ISOVUE-300) INJECTION 61%
75.0000 mL | Freq: Once | INTRAVENOUS | Status: AC | PRN
  Administered 2018-06-11: 75 mL via INTRAVENOUS

## 2018-06-17 ENCOUNTER — Ambulatory Visit: Payer: Medicare Other | Admitting: Internal Medicine

## 2018-06-30 ENCOUNTER — Ambulatory Visit: Payer: Medicare Other | Admitting: Internal Medicine

## 2018-07-04 ENCOUNTER — Telehealth: Payer: Self-pay | Admitting: Internal Medicine

## 2018-07-04 NOTE — Telephone Encounter (Signed)
Per reschedule list change 4/22 f/u to webex and added lab 4/21. Left message for patient on both home/cell phone.

## 2018-07-11 ENCOUNTER — Other Ambulatory Visit: Payer: Self-pay | Admitting: *Deleted

## 2018-07-11 DIAGNOSIS — C3412 Malignant neoplasm of upper lobe, left bronchus or lung: Secondary | ICD-10-CM

## 2018-07-15 ENCOUNTER — Telehealth: Payer: Self-pay | Admitting: Internal Medicine

## 2018-07-15 ENCOUNTER — Inpatient Hospital Stay: Payer: Medicare Other | Attending: Internal Medicine

## 2018-07-15 ENCOUNTER — Other Ambulatory Visit: Payer: Self-pay

## 2018-07-15 DIAGNOSIS — C3412 Malignant neoplasm of upper lobe, left bronchus or lung: Secondary | ICD-10-CM

## 2018-07-15 LAB — CBC WITH DIFFERENTIAL (CANCER CENTER ONLY)
Abs Immature Granulocytes: 0.02 10*3/uL (ref 0.00–0.07)
Basophils Absolute: 0.1 10*3/uL (ref 0.0–0.1)
Basophils Relative: 1 %
Eosinophils Absolute: 0.3 10*3/uL (ref 0.0–0.5)
Eosinophils Relative: 6 %
HCT: 42.2 % (ref 36.0–46.0)
Hemoglobin: 13.6 g/dL (ref 12.0–15.0)
Immature Granulocytes: 0 %
Lymphocytes Relative: 26 %
Lymphs Abs: 1.5 10*3/uL (ref 0.7–4.0)
MCH: 30 pg (ref 26.0–34.0)
MCHC: 32.2 g/dL (ref 30.0–36.0)
MCV: 93.2 fL (ref 80.0–100.0)
Monocytes Absolute: 0.4 10*3/uL (ref 0.1–1.0)
Monocytes Relative: 7 %
Neutro Abs: 3.3 10*3/uL (ref 1.7–7.7)
Neutrophils Relative %: 60 %
Platelet Count: 317 10*3/uL (ref 150–400)
RBC: 4.53 MIL/uL (ref 3.87–5.11)
RDW: 13.5 % (ref 11.5–15.5)
WBC Count: 5.5 10*3/uL (ref 4.0–10.5)
nRBC: 0 % (ref 0.0–0.2)

## 2018-07-15 LAB — CMP (CANCER CENTER ONLY)
ALT: 11 U/L (ref 0–44)
AST: 15 U/L (ref 15–41)
Albumin: 3.4 g/dL — ABNORMAL LOW (ref 3.5–5.0)
Alkaline Phosphatase: 68 U/L (ref 38–126)
Anion gap: 10 (ref 5–15)
BUN: 12 mg/dL (ref 8–23)
CO2: 24 mmol/L (ref 22–32)
Calcium: 9 mg/dL (ref 8.9–10.3)
Chloride: 106 mmol/L (ref 98–111)
Creatinine: 0.83 mg/dL (ref 0.44–1.00)
GFR, Est AFR Am: >60 mL/min (ref >60)
GFR, Estimated: >60 mL/min (ref >60)
Glucose, Bld: 115 mg/dL — ABNORMAL HIGH (ref 70–99)
Potassium: 3.8 mmol/L (ref 3.5–5.1)
Sodium: 140 mmol/L (ref 135–145)
Total Bilirubin: 0.3 mg/dL (ref 0.3–1.2)
Total Protein: 7 g/dL (ref 6.5–8.1)

## 2018-07-15 NOTE — Telephone Encounter (Signed)
Called regarding upcoming Webex appointment, left patient a voicemail and sent an e-mail.

## 2018-07-16 ENCOUNTER — Other Ambulatory Visit: Payer: Self-pay | Admitting: Internal Medicine

## 2018-07-16 ENCOUNTER — Inpatient Hospital Stay (HOSPITAL_BASED_OUTPATIENT_CLINIC_OR_DEPARTMENT_OTHER): Payer: Medicare Other | Admitting: Internal Medicine

## 2018-07-16 ENCOUNTER — Encounter: Payer: Self-pay | Admitting: Internal Medicine

## 2018-07-16 DIAGNOSIS — C50111 Malignant neoplasm of central portion of right female breast: Secondary | ICD-10-CM | POA: Diagnosis not present

## 2018-07-16 DIAGNOSIS — C3412 Malignant neoplasm of upper lobe, left bronchus or lung: Secondary | ICD-10-CM | POA: Diagnosis not present

## 2018-07-16 DIAGNOSIS — Z170 Estrogen receptor positive status [ER+]: Secondary | ICD-10-CM | POA: Diagnosis not present

## 2018-07-16 DIAGNOSIS — D0511 Intraductal carcinoma in situ of right breast: Secondary | ICD-10-CM

## 2018-07-16 NOTE — Progress Notes (Signed)
[place] [phone]   Fax:([phone]  PROGRESS NOTE FOR TELEMEDICINE VISITS  [name], MD [address] [place] [address]  I connected with@ on 07/16/18 at  9:15 AM EDT by telephone visit and verified that I am speaking with the correct person using two identifiers.   I discussed the limitations, risks, security and privacy concerns of performing an evaluation and management service by telemedicine and the availability of in-person appointments. I also discussed with the patient that there may be a patient responsible charge related to this service. The patient expressed understanding and agreed to proceed.  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  PRIOR THERAPY: 1) status post left trisegmentectomy with node dissection on 06/24/2004 2) status post 4 cycles of adjuvant chemotherapy with carboplatin and paclitaxel last dose was given 10/24/2004. 3) status post right partial mastectomy on 04/10/2012 under the care of Dr. Brantley Stage. 4) status post adjuvant radiotherapy to the right breast under the care of Dr. Valere Dross  CURRENT THERAPY: Tamoxifen 20 mg by mouth daily started in May of 2014.  INTERVAL HISTORY: [name] 68 y.o. female has a telephone virtual visit with me today for evaluation and discussion of her lab results.  The patient is feeling fine today with no concerning complaints.  She is dealing with some thyroid issues and she had CT scan of the neck performed recently that showed multinodular right thyroid lobe.  She is followed by Dr. Buddy Duty from endocrinology.  The patient denied having any  significant chest pain, shortness of breath, cough or hemoptysis.  She denied having any fever or chills.  She has no nausea, vomiting, diarrhea or constipation.  She had repeat blood work performed recently and we are having the visit for evaluation and discussion of her lab results.  MEDICAL HISTORY: Past Medical History:  Diagnosis Date  . Anxiety   . Breast cancer ([place])    2014 and 2015  . Cancer ([facility])    lung cancer   . DDD (degenerative disc disease)    neck  . Eczema   . Family history of breast cancer   . Family history of stomach cancer   . GERD (gastroesophageal reflux disease)    occasional - no current med.  Marland Kitchen History of breast cancer   . History of lung cancer    non-small cell - left upper lobe  . Osteoarthritis   . Personal history of radiation therapy 2014  . PONV (postoperative nausea and vomiting)   . Radiation 07/03/12-07/09/12   Right upper lung 5400 cGy  . Sinus headache    occasional  . Thyroid nodule, toxic or with hyperthyroidism    no current med.  . Urinary, incontinence, stress female     ALLERGIES:  is allergic to amoxicillin; asa [aspirin]; banana; ceftin [cefuroxime axetil]; lactose intolerance (gi); orange fruit [citrus]; penicillins; and adhesive [tape].  MEDICATIONS:  Current Outpatient Medications  Medication Sig Dispense Refill  . acetaminophen (TYLENOL) 500 MG tablet Take 1,000 mg by mouth every 6 (six) hours as needed (arthritis).    . cyclobenzaprine (FLEXERIL) 10 MG tablet Take 1 tablet by mouth 3 (three) times daily as  needed.  0  . cycloSPORINE (RESTASIS) 0.05 % ophthalmic emulsion 1 drop 2 (two) times daily.    . fluocinonide gel (LIDEX) 0.05 %     . fluticasone (FLONASE) 50 MCG/ACT nasal spray Place 1 spray into both nostrils daily.    Marland Kitchen ibuprofen (ADVIL,MOTRIN) 600 MG tablet Take 600 mg by mouth 2 (two) times daily as needed.   0  . Lactobacillus (ACIDOPHILUS) 0.5 MG TABS Take by mouth.    . meloxicam (MOBIC) 15 MG tablet Take 1  tablet by mouth daily as needed.  0  . Multiple Vitamin (MULTIVITAMIN) capsule Take 1 capsule by mouth.    Vladimir Faster Glycol-Propyl Glycol (SYSTANE OP) Apply 1 drop to eye as needed (dry eyes). Reported on 04/28/2015    . ranitidine (ZANTAC) 150 MG tablet Take 150 mg by mouth 2 (two) times daily as needed.    . tamoxifen (NOLVADEX) 20 MG tablet TAKE 1 TABLET BY MOUTH EVERY DAY 30 tablet 5  . vitamin E 400 UNIT capsule Take 400 Units by mouth.     No current facility-administered medications for this visit.     SURGICAL HISTORY:  Past Surgical History:  Procedure Laterality Date  . APPENDECTOMY  1971  . BREAST LUMPECTOMY Right 2014  . BREAST RECONSTRUCTION WITH PLACEMENT OF TISSUE EXPANDER AND FLEX HD (ACELLULAR HYDRATED DERMIS) Right 05/20/2013   Procedure: IMMEDIATE RIGHT BREAST RECONSTRUCTION WITH LATISSAMUS MUSCLE FLAP AND PLACEMENT OF TISSUE EXPANDER TO RIGHT BREAST;  Surgeon: Theodoro Kos, DO;  Location: [facility];  Service: Plastics;  Laterality: Right;  . BREAST REDUCTION WITH MASTOPEXY Left 10/22/2013   Procedure: BREAST REDUCTION/MASTOPEXY TO LEFT BREAST WITH LIPOSUCTION/FAT GRAFTING FOR SYMMETRY;  Surgeon: Theodoro Kos, DO;  Location: [facility];  Service: Plastics;  Laterality: Left;  . DE QUERVAIN'S RELEASE Left 07/31/2002  . MASTECTOMY Right    2015  . MASTECTOMY W/ SENTINEL NODE BIOPSY Right 05/20/2013   Procedure: RIGHT SIMPLEMASTECTOMY AND SENTINEL NODE   Sampling;  Surgeon: Joyice Faster. Cornett, MD;  Location: [facility];  Service: General;  Laterality: Right;  . MOUTH SURGERY Right may 2014   "keratocystic odotogenic tumor" - X 2  . NASAL SINUS SURGERY  ?1988-1990   "at least 3" (05/21/2013)  . PARTIAL MASTECTOMY WITH NEEDLE LOCALIZATION  04/10/2012   Procedure: PARTIAL MASTECTOMY WITH NEEDLE LOCALIZATION;  Surgeon: Marcello Moores A. Cornett, MD;  Location: [facility];  Service: General;  Laterality: Right;  . REDUCTION MAMMAPLASTY Left 2015  . REMOVAL OF TISSUE  EXPANDER AND PLACEMENT OF IMPLANT Right 10/22/2013   Procedure: REMOVAL OF RIGHT TISSUE EXPANDER WITH PLACEMENT OF RIGHT BREAST IMPLANT;  Surgeon: Theodoro Kos, DO;  Location: [facility];  Service: Plastics;  Laterality: Right;  . THORACOTOMY Left 07/14/2004  . TRIGGER FINGER RELEASE Bilateral    "I've had OR on q finger" (05/21/2013)  . TRIGGER FINGER RELEASE Right 07/14/1999   index and little fingers  . TRIGGER FINGER RELEASE Right 07/17/2002   long and ring fingers  . TRIGGER FINGER RELEASE Left 07/31/2002   little, ring and index fingers  . VIDEO ASSISTED THORACOSCOPY (VATS)/ LOBECTOMY Left 07/14/2004   upper tri-segmentectomy  . WISDOM TOOTH EXTRACTION     "3"    REVIEW OF SYSTEMS:  A comprehensive review of systems was negative except for: Constitutional: positive for fatigue and weight loss   LABORATORY DATA: Lab Results  Component Value Date   WBC 5.5 07/15/2018   HGB 13.6 07/15/2018   HCT 42.2  07/15/2018   MCV 93.2 07/15/2018   PLT 317 07/15/2018      Chemistry      Component Value Date/Time   NA 140 07/15/2018 1111   NA 141 10/29/2016 0742   K 3.8 07/15/2018 1111   K 4.1 10/29/2016 0742   CL 106 07/15/2018 1111   CL 108 (H) 04/28/2012 1008   CO2 24 07/15/2018 1111   CO2 26 10/29/2016 0742   BUN 12 07/15/2018 1111   BUN 21.1 10/29/2016 0742   CREATININE 0.83 07/15/2018 1111   CREATININE 0.9 10/29/2016 0742      Component Value Date/Time   CALCIUM 9.0 07/15/2018 1111   CALCIUM 9.0 10/29/2016 0742   ALKPHOS 68 07/15/2018 1111   ALKPHOS 49 10/29/2016 0742   AST 15 07/15/2018 1111   AST 15 10/29/2016 0742   ALT 11 07/15/2018 1111   ALT 12 10/29/2016 0742   BILITOT 0.3 07/15/2018 1111   BILITOT 0.32 10/29/2016 0742       RADIOGRAPHIC STUDIES: No results found.  ASSESSMENT AND PLAN: This is a very pleasant 68 years old white female with stage II a non-small cell lung cancer as well as recurrent breast ductal carcinoma in situ. The patient is  status post right lumpectomy followed by adjuvant radiotherapy The patient is currently on tamoxifen and tolerating it fairly well. Repeat blood work performed recently showed no concerning findings except for slight elevation of CA 27.29. I discussed the lab results with the patient today I recommended for her to continue on observation with repeat labs and chest x-ray in 6 months. She was advised to call immediately if she has any concerning symptoms in the interval. For the thyroid dysfunction, she is followed by her endocrinologist Dr. Buddy Duty. I discussed the assessment and treatment plan with the patient. The patient was provided an opportunity to ask questions and all were answered. The patient agreed with the plan and demonstrated an understanding of the instructions.   The patient was advised to call back or seek an in-person evaluation if the symptoms worsen or if the condition fails to improve as anticipated.  I provided 12 minutes of non face-to-face telephone visit time during this encounter, and > 50% was spent counseling as documented under my assessment & plan.  Eilleen Kempf, MD 07/16/2018 9:21 AM  Disclaimer: This note was dictated with voice recognition software. Similar sounding words can inadvertently be transcribed and may not be corrected upon review.

## 2018-07-17 ENCOUNTER — Telehealth: Payer: Self-pay | Admitting: Internal Medicine

## 2018-07-17 NOTE — Telephone Encounter (Signed)
Called regarding schedule °

## 2018-10-02 ENCOUNTER — Other Ambulatory Visit: Payer: Self-pay | Admitting: Family Medicine

## 2018-10-02 DIAGNOSIS — Z1231 Encounter for screening mammogram for malignant neoplasm of breast: Secondary | ICD-10-CM

## 2018-11-17 ENCOUNTER — Ambulatory Visit
Admission: RE | Admit: 2018-11-17 | Discharge: 2018-11-17 | Disposition: A | Discharge status: Home / Self Care | Payer: Medicare Other | Source: Ambulatory Visit | Attending: Family Medicine | Admitting: Family Medicine

## 2018-11-17 ENCOUNTER — Other Ambulatory Visit: Payer: Self-pay

## 2018-11-17 DIAGNOSIS — Z1231 Encounter for screening mammogram for malignant neoplasm of breast: Secondary | ICD-10-CM

## 2018-11-17 IMAGING — MG DIGITAL SCREENING UNILATERAL LEFT MAMMOGRAM WITH CAD AND TOMO
4 series · 4 of 12 positions shown · non-contrast
Comparison: Previous exam(s).

CLINICAL DATA: Screening.

EXAM:
DIGITAL SCREENING UNILATERAL LEFT MAMMOGRAM WITH CAD AND TOMO

[L CC synth-2D]
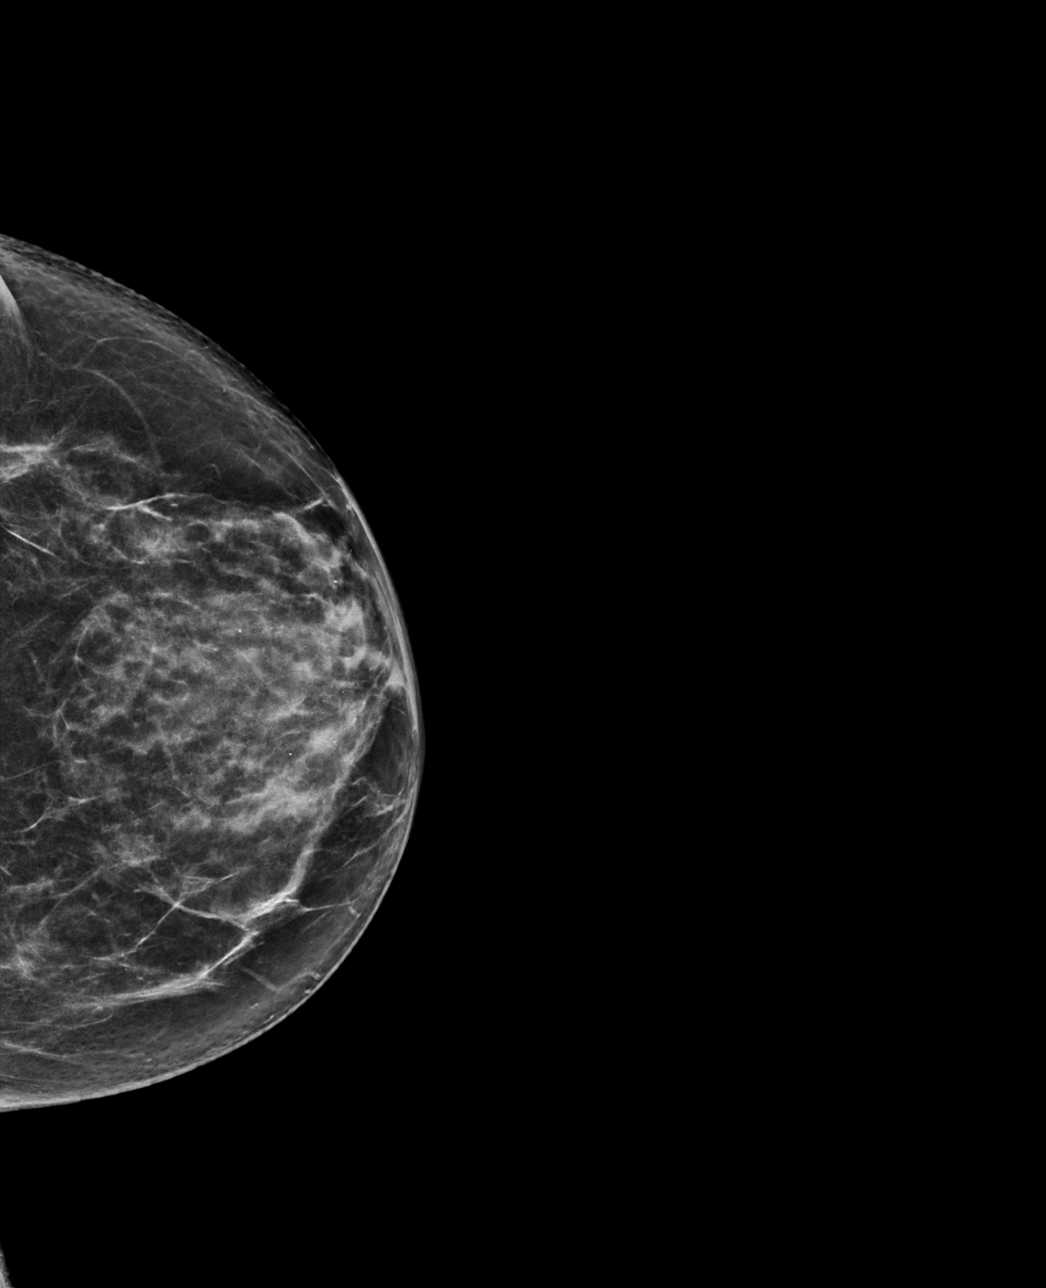

[L MLO synth-2D]
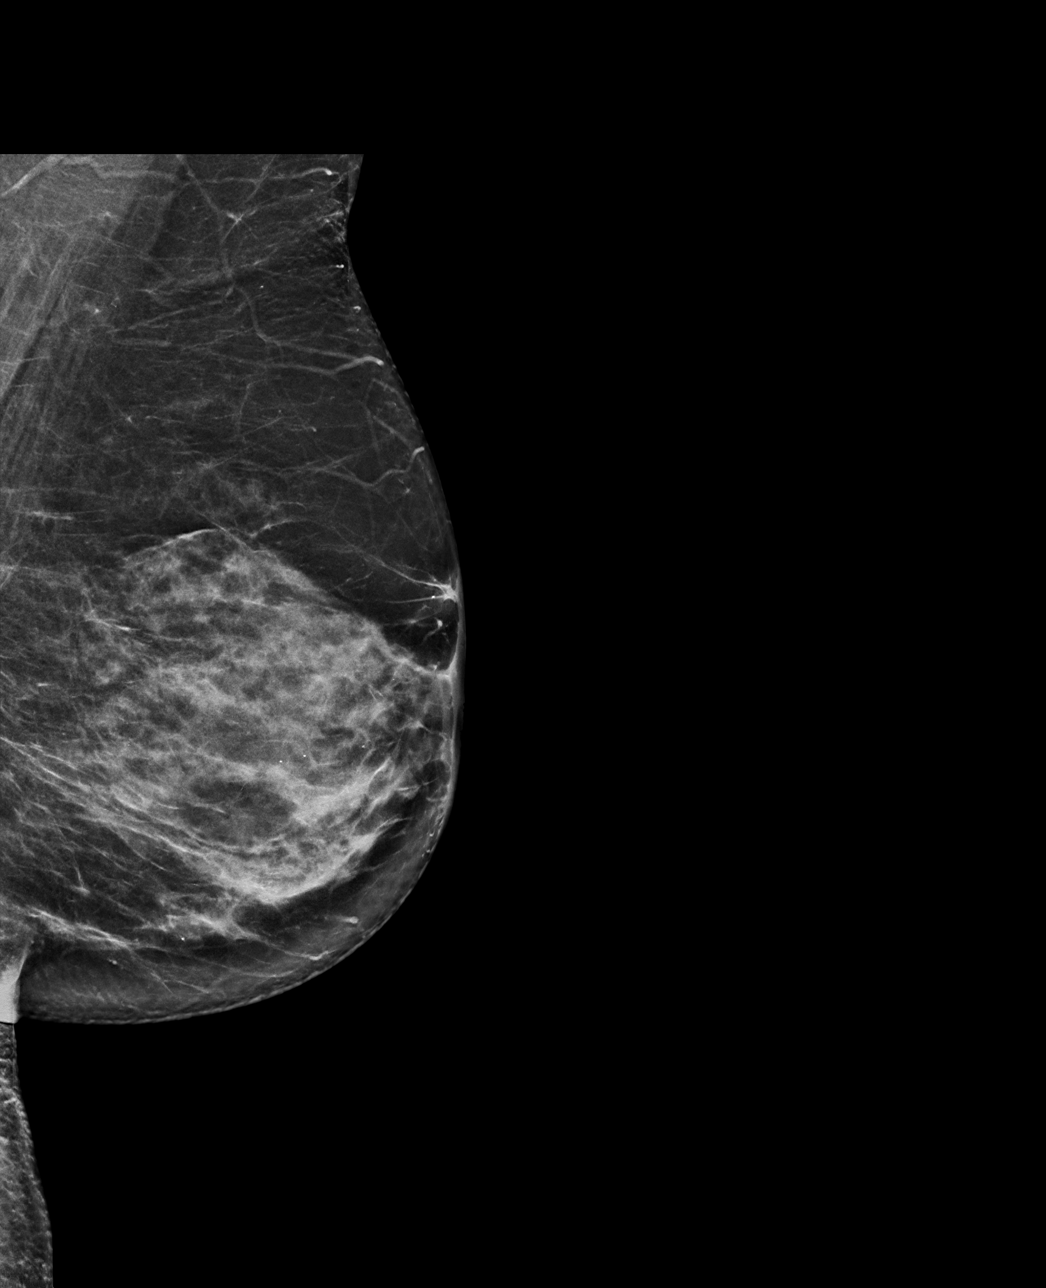

[L CC tomo · tomo slice 35/70.0]
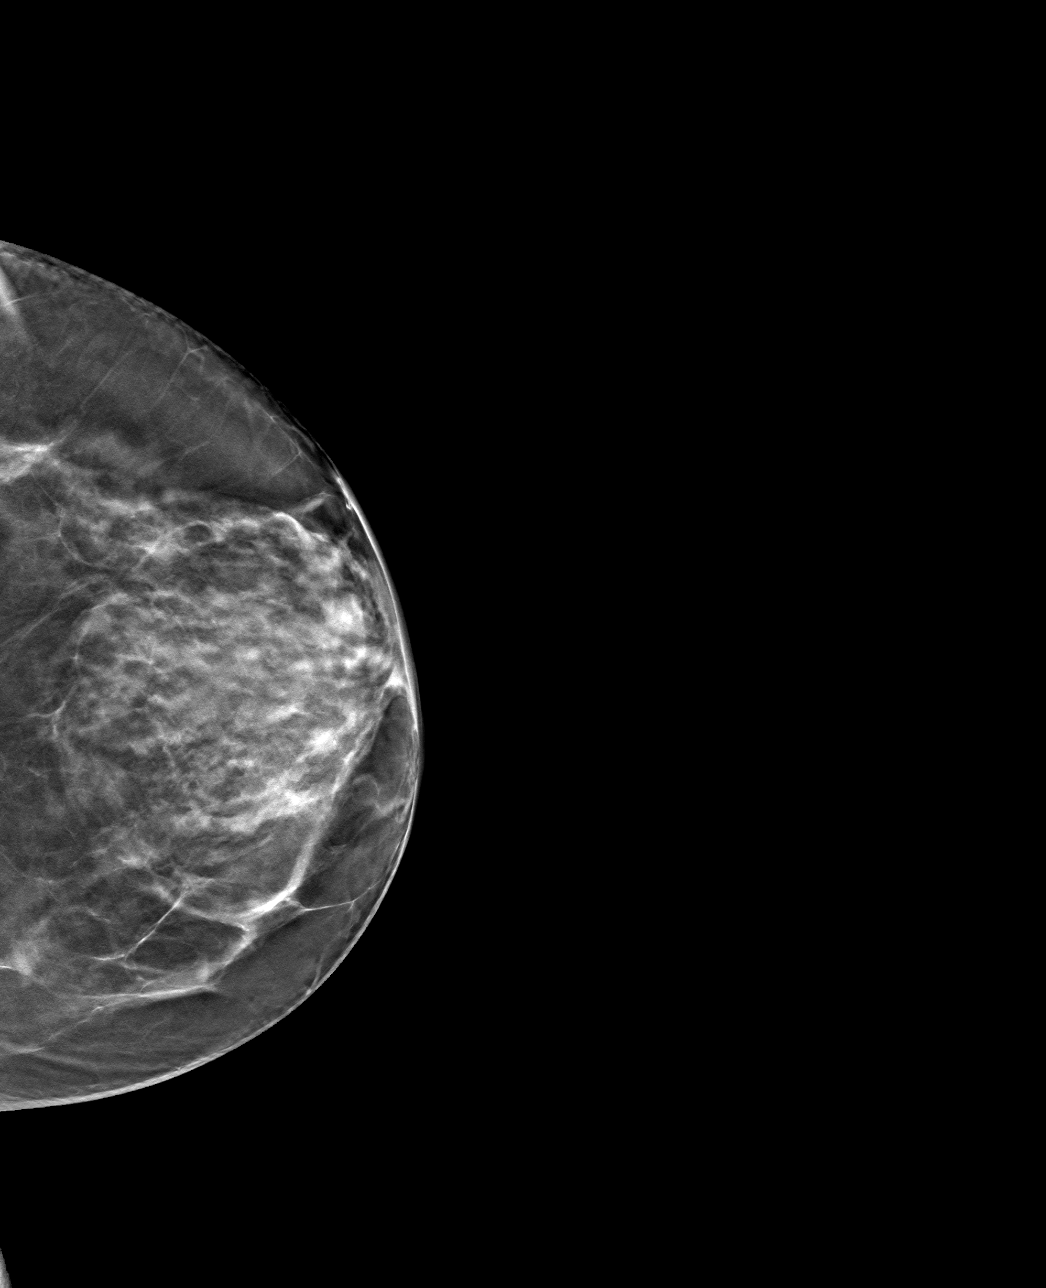

[L MLO tomo · tomo slice 37/72.0]
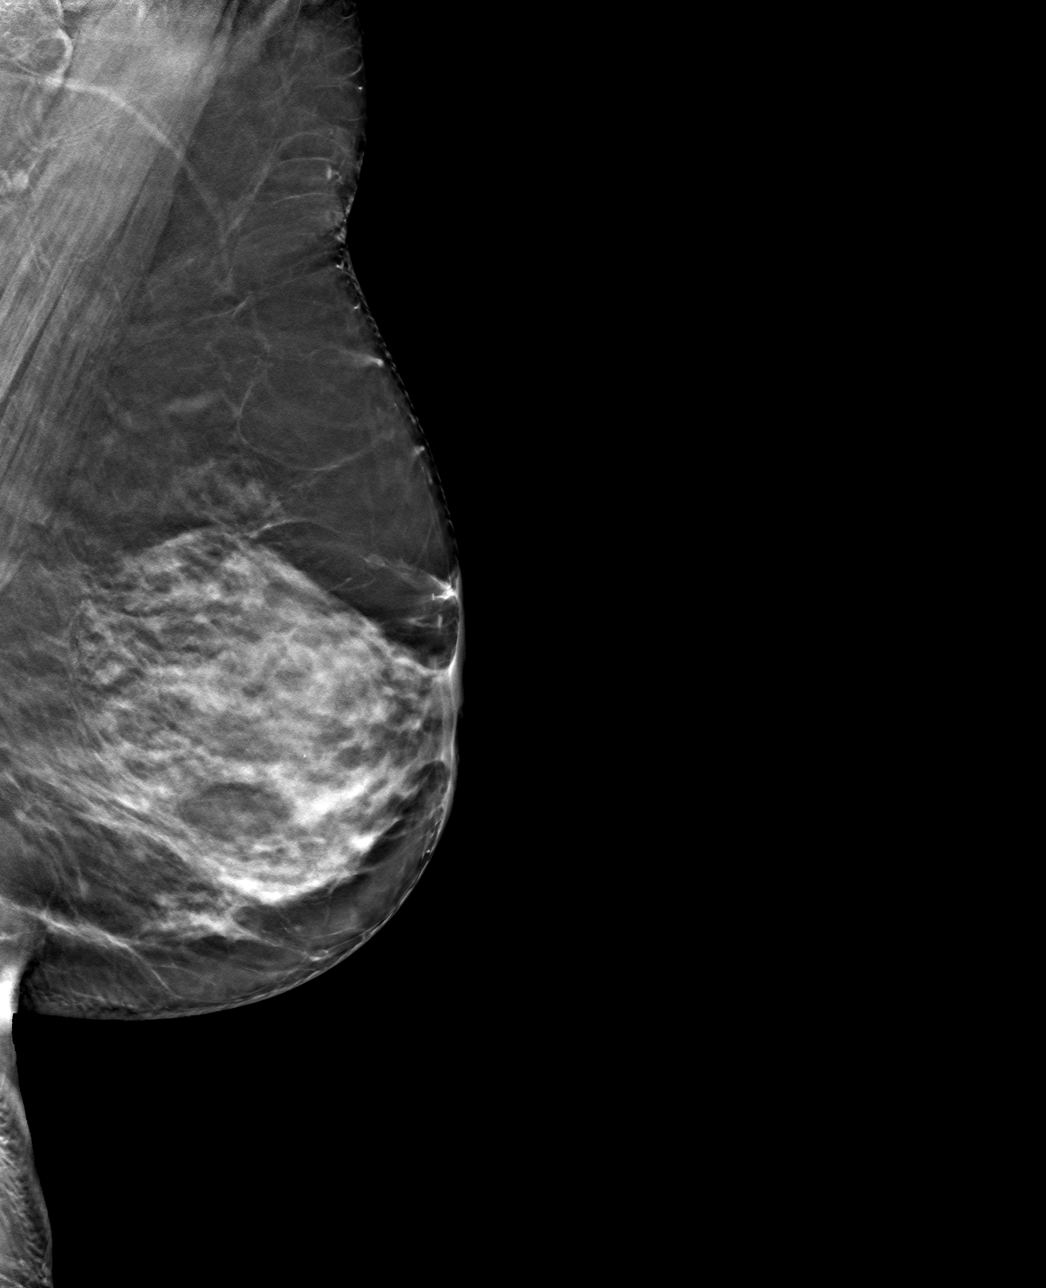

[4 of 12 positions shown; findings below may reference images not displayed]

ACR Breast Density Category c: The breast tissue is heterogeneously
dense, which may obscure small masses.
FINDINGS: The patient has had a right mastectomy. There are no findings
suspicious for malignancy.

Images were processed with CAD.
IMPRESSION: No mammographic evidence of malignancy. A result letter of this
screening mammogram will be mailed directly to the patient.

RECOMMENDATION:
Screening mammogram in one year.  (Code:[H0])

BI-RADS CATEGORY  1: Negative.

## 2018-11-24 DIAGNOSIS — H2513 Age-related nuclear cataract, bilateral: Secondary | ICD-10-CM | POA: Diagnosis not present

## 2018-12-04 DIAGNOSIS — Z23 Encounter for immunization: Secondary | ICD-10-CM | POA: Diagnosis not present

## 2018-12-19 ENCOUNTER — Other Ambulatory Visit: Payer: Self-pay | Admitting: Otolaryngology

## 2018-12-20 DIAGNOSIS — Z23 Encounter for immunization: Secondary | ICD-10-CM | POA: Diagnosis not present

## 2018-12-22 ENCOUNTER — Other Ambulatory Visit: Payer: Self-pay | Admitting: Otolaryngology

## 2018-12-22 DIAGNOSIS — D34 Benign neoplasm of thyroid gland: Secondary | ICD-10-CM

## 2018-12-29 ENCOUNTER — Ambulatory Visit
Admission: RE | Admit: 2018-12-29 | Discharge: 2018-12-29 | Disposition: A | Discharge status: Home / Self Care | Payer: Medicare Other | Source: Ambulatory Visit | Attending: Otolaryngology | Admitting: Otolaryngology

## 2018-12-29 DIAGNOSIS — E041 Nontoxic single thyroid nodule: Secondary | ICD-10-CM | POA: Diagnosis not present

## 2018-12-29 DIAGNOSIS — D34 Benign neoplasm of thyroid gland: Secondary | ICD-10-CM

## 2018-12-29 IMAGING — US US THYROID
1 series · 12 of 25 positions shown · non-contrast
Comparison: FNA dated [DATE].  Ultrasound dated [DATE].

CLINICAL DATA: Benign neoplasm of the thyroid gland

EXAM:
THYROID ULTRASOUND
TECHNIQUE: Ultrasound examination of the thyroid gland and adjacent soft
tissues was performed.

[Series 1: us thyroid · 0.05mm/px · 12 of 48 slices shown]
[im 2/48]
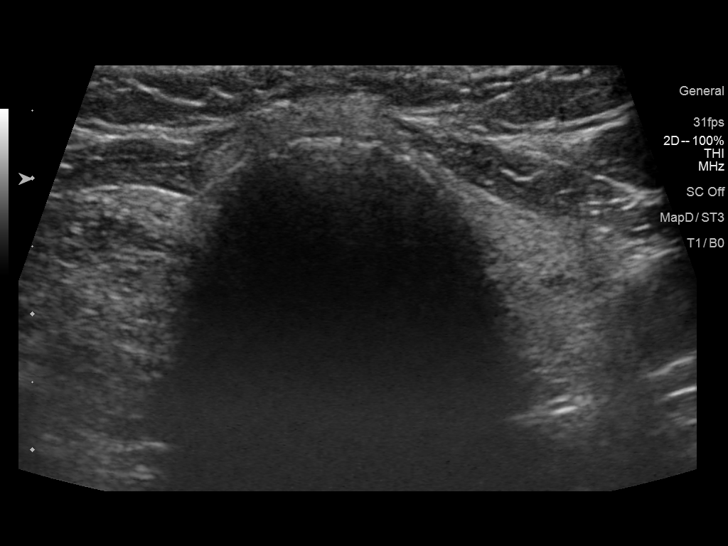
[im 6/48]
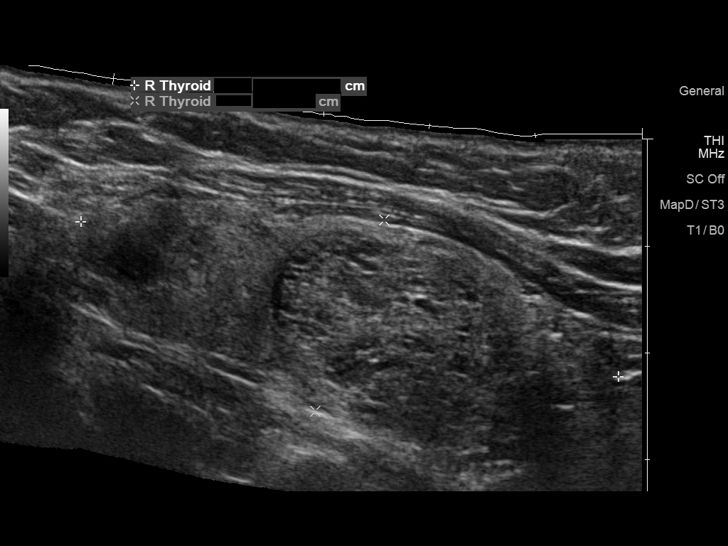
[im 10/48]
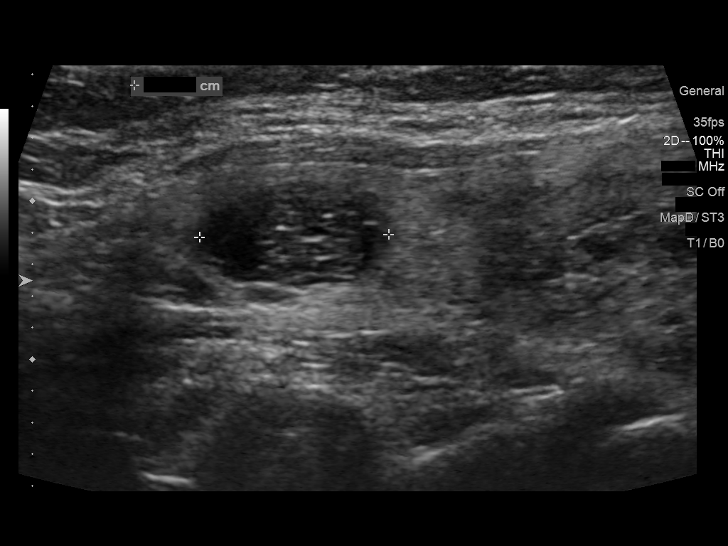
[im 14/48]
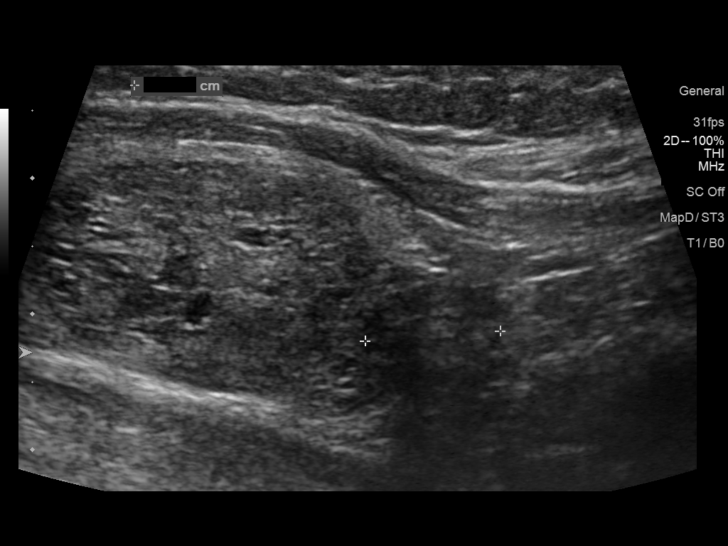
[im 18/48]
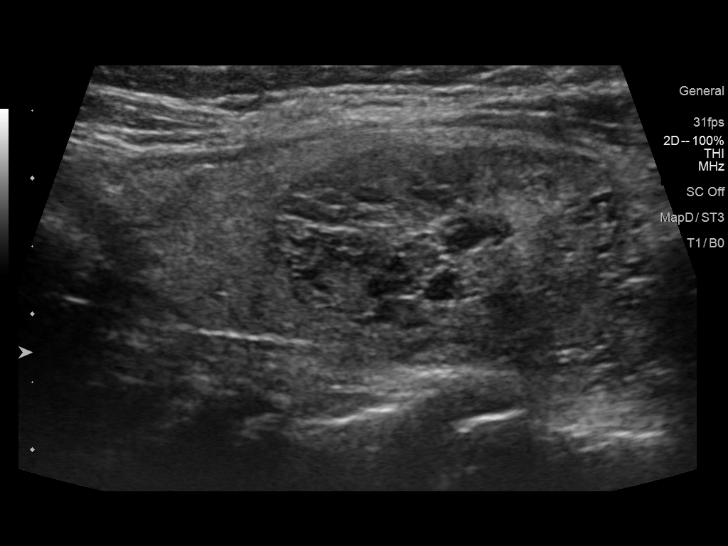
[im 22/48]
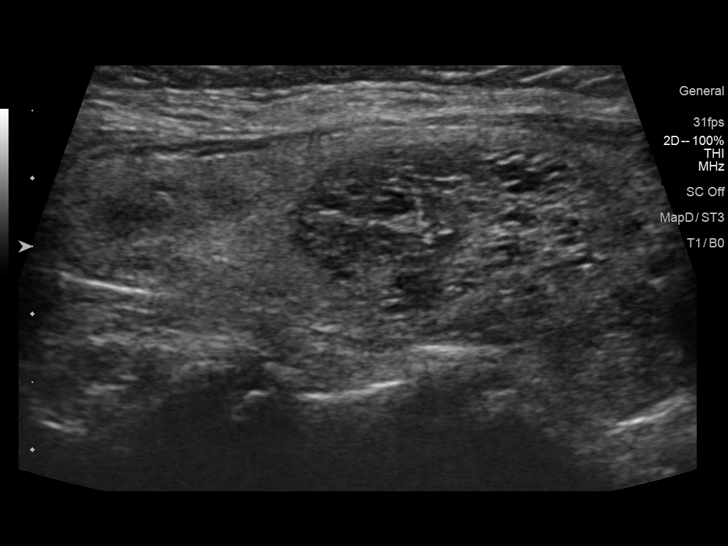
[im 26/48]
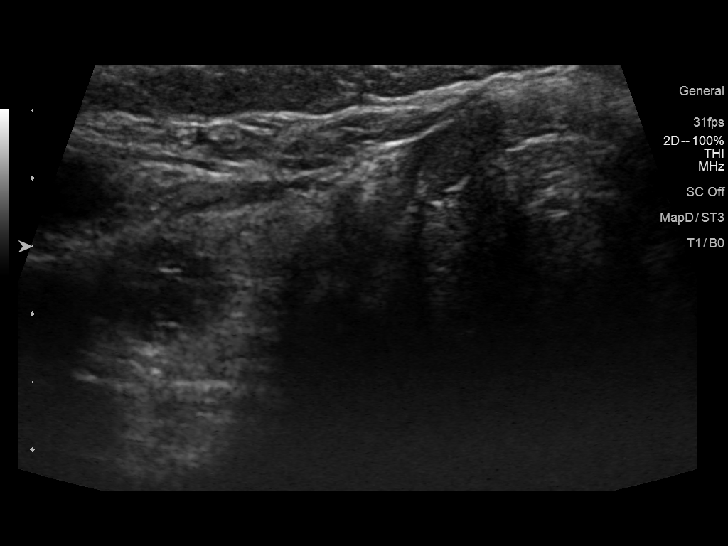
[im 30/48]
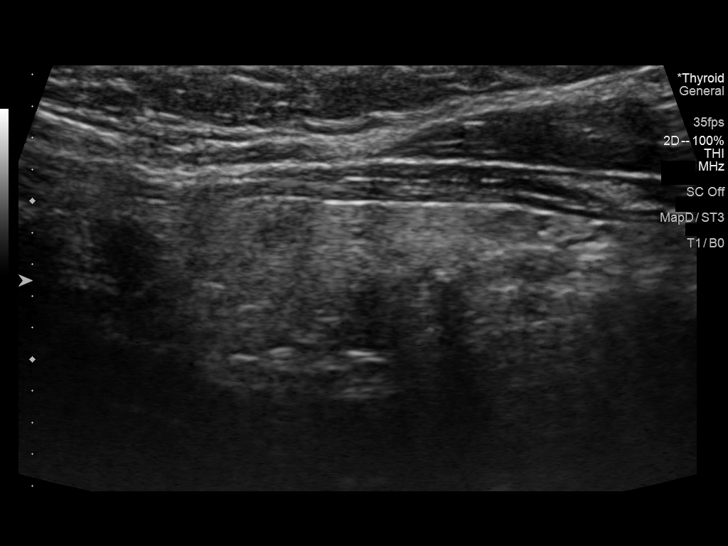
[im 34/48]
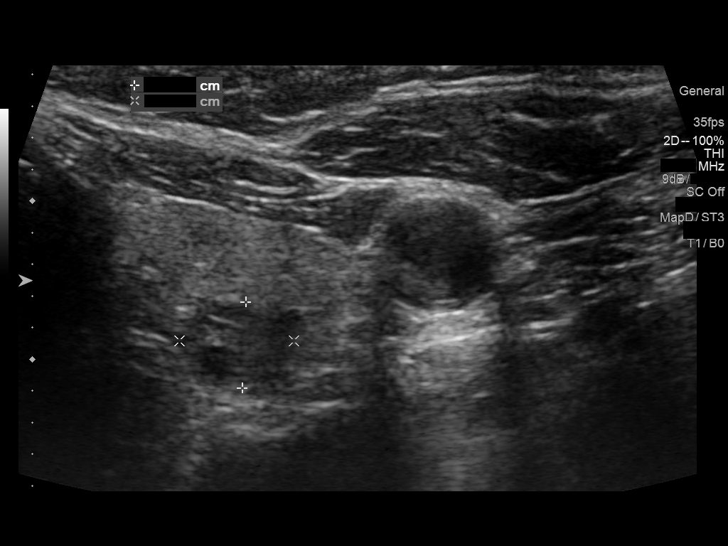
[im 38/48]
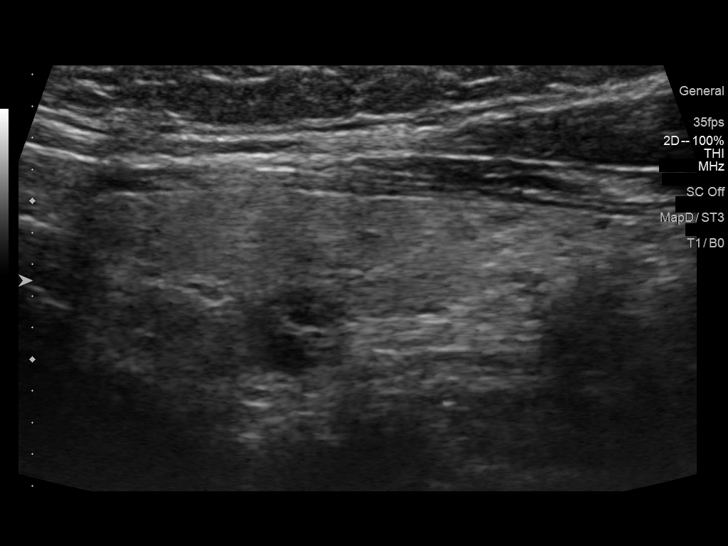
[im 42/48]
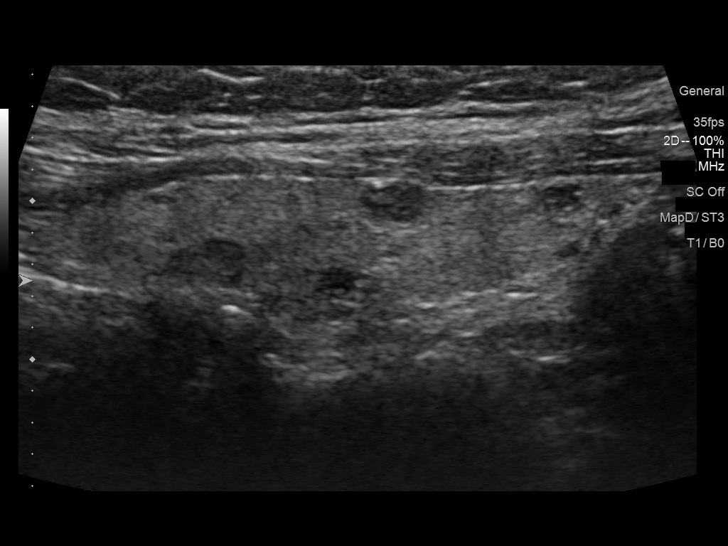
[im 46/48]
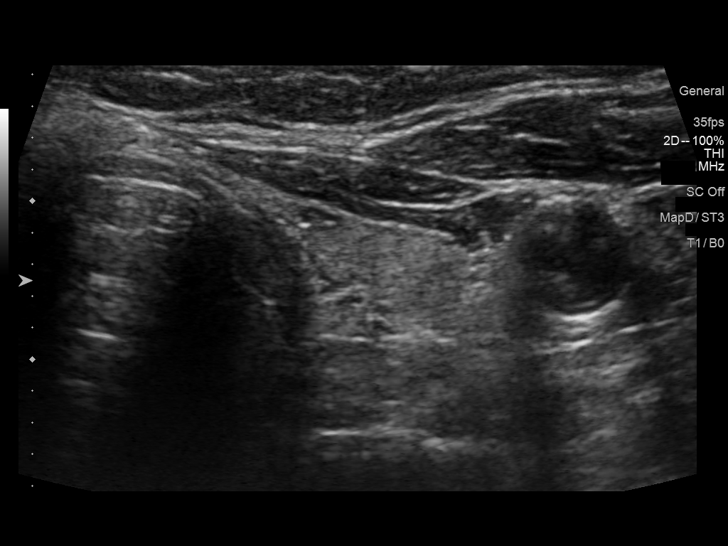

[12 of 25 positions shown; findings below may reference images not displayed]

FINDINGS: Parenchymal Echotexture: Mildly heterogenous

Isthmus: 0.2 cm

Right lobe: 5.2 x 1.9 x 2.1 cm

Left lobe: 4.7 x 1.7 x 1.2 cm

_________________________________________________________

Estimated total number of nodules >/= 1 cm: 3

Number of spongiform nodules >/=  2 cm not described below (TR1): 0

Number of mixed cystic and solid nodules >/= 1.5 cm not described
below (TR2): 0

_________________________________________________________

Nodule # 1:

Prior biopsy: No

Location: Right; Superior

Maximum size: 1.2 cm; Other 2 dimensions: 1 x 0.7 cm, previously,
1.2 x 1 x 0.8 cm

Composition: solid/almost completely solid (2)

Echogenicity: hypoechoic (2)

Shape: not taller-than-wide (0)

Margins: smooth (0)

Echogenic foci: none (0)

ACR TI-RADS total points: 4.

ACR TI-RADS risk category:  TR4 (4-6 points).

Significant change in size (>/= 20% in two dimensions and minimal
increase of 2 mm): No

Change in features: No

Change in ACR TI-RADS risk category: No

ACR TI-RADS recommendations:

*Given size (>/= 1 - 1.4 cm) and appearance, a follow-up ultrasound
in 1 year should be considered based on TI-RADS criteria.

_________________________________________________________

Nodule # 2:

Prior biopsy: Yes

Location: Right; Mid

Maximum size: 2.6 cm; Other 2 dimensions: 2.2 x 1.8 cm, previously,
2.6 x 2.5 x 1.7 cm

Composition: solid/almost completely solid (2)

Echogenicity: isoechoic (1)

Shape: not taller-than-wide (0)

Margins: smooth (0)

Echogenic foci: none (0)

ACR TI-RADS total points: 3.

ACR TI-RADS risk category:  TR3 (3 points).

Significant change in size (>/= 20% in two dimensions and minimal
increase of 2 mm): No

Change in features: No

Change in ACR TI-RADS risk category: No

ACR TI-RADS recommendations:

**Given size (>/= 2.5 cm) and appearance, fine needle aspiration of
this mildly suspicious nodule should be considered based on TI-RADS
criteria.

_________________________________________________________

Nodule # 3:

Prior biopsy: No

Location: Right; Inferior

Maximum size: 1 cm; Other 2 dimensions: 1 x 0.7 cm, previously, 1 x
1 x 1 cm

Composition: solid/almost completely solid (2)

Echogenicity: isoechoic (1). There is some shadowing which partially
obscures the nodule, however it is favored to be isoechoic on the
sagittal image.

Shape: not taller-than-wide (0)

Margins: smooth (0)

Echogenic foci: none (0)

ACR TI-RADS total points: 3.

ACR TI-RADS risk category:  TR3 (3 points).

Significant change in size (>/= 20% in two dimensions and minimal
increase of 2 mm): No

Change in features: Yes

Change in ACR TI-RADS risk category: Yes

ACR TI-RADS recommendations:

Given size (<1.4 cm) and appearance, this nodule does NOT meet
TI-RADS criteria for biopsy or dedicated follow-up.

_________________________________________________________

Nodule # 4:

Prior biopsy: No

Location: Left; Mid

Maximum size: 0.7 cm; Other 2 dimensions: 0.6 x 0.5 cm, previously,
0.6 x 0.6 x 0.5 cm

Composition: solid/almost completely solid (2)

Echogenicity: hypoechoic (2)

Shape: not taller-than-wide (0)

Margins: smooth (0)

Echogenic foci: none (0)

ACR TI-RADS total points: 4.

ACR TI-RADS risk category:  TR4 (4-6 points).

Significant change in size (>/= 20% in two dimensions and minimal
increase of 2 mm): No

Change in features: No

Change in ACR TI-RADS risk category: No

ACR TI-RADS recommendations:

Given size (<0.9 cm) and appearance, this nodule does NOT meet
TI-RADS criteria for biopsy or dedicated follow-up.

_________________________________________________________
IMPRESSION: 1. Stable 2.6 cm thyroid nodule in the right mid thyroid gland. This
was previously biopsied. Correlation with biopsy results is
recommended.
2. Stable 1.2 cm thyroid nodule in the right superior thyroid gland.
This nodule meets criteria for 1 year follow-up.
3. Stable 1 cm nodule in the inferior right thyroid gland which has
been down graded to a TR 3 nodule given that it now appears more
isoechoic then hypoechoic. No further follow-up is required for this
nodule.

The above is in keeping with the ACR TI-RADS recommendations - [HOSPITAL] [FA];[DATE].

## 2019-01-08 ENCOUNTER — Other Ambulatory Visit: Payer: Self-pay | Admitting: Internal Medicine

## 2019-01-08 DIAGNOSIS — D0511 Intraductal carcinoma in situ of right breast: Secondary | ICD-10-CM

## 2019-01-13 ENCOUNTER — Other Ambulatory Visit: Payer: Self-pay

## 2019-01-13 ENCOUNTER — Inpatient Hospital Stay: Payer: Medicare Other | Attending: Internal Medicine

## 2019-01-13 DIAGNOSIS — Z85118 Personal history of other malignant neoplasm of bronchus and lung: Secondary | ICD-10-CM | POA: Insufficient documentation

## 2019-01-13 DIAGNOSIS — Z9221 Personal history of antineoplastic chemotherapy: Secondary | ICD-10-CM | POA: Insufficient documentation

## 2019-01-13 DIAGNOSIS — R978 Other abnormal tumor markers: Secondary | ICD-10-CM | POA: Diagnosis not present

## 2019-01-13 DIAGNOSIS — Z79810 Long term (current) use of selective estrogen receptor modulators (SERMs): Secondary | ICD-10-CM | POA: Insufficient documentation

## 2019-01-13 DIAGNOSIS — F419 Anxiety disorder, unspecified: Secondary | ICD-10-CM | POA: Diagnosis not present

## 2019-01-13 DIAGNOSIS — Z170 Estrogen receptor positive status [ER+]: Secondary | ICD-10-CM | POA: Diagnosis not present

## 2019-01-13 DIAGNOSIS — R079 Chest pain, unspecified: Secondary | ICD-10-CM | POA: Insufficient documentation

## 2019-01-13 DIAGNOSIS — D0511 Intraductal carcinoma in situ of right breast: Secondary | ICD-10-CM | POA: Diagnosis not present

## 2019-01-13 DIAGNOSIS — M1990 Unspecified osteoarthritis, unspecified site: Secondary | ICD-10-CM | POA: Diagnosis not present

## 2019-01-13 DIAGNOSIS — C50111 Malignant neoplasm of central portion of right female breast: Secondary | ICD-10-CM

## 2019-01-13 DIAGNOSIS — C3412 Malignant neoplasm of upper lobe, left bronchus or lung: Secondary | ICD-10-CM

## 2019-01-13 LAB — CMP (CANCER CENTER ONLY)
ALT: 12 U/L (ref 0–44)
AST: 15 U/L (ref 15–41)
Albumin: 3.4 g/dL — ABNORMAL LOW (ref 3.5–5.0)
Alkaline Phosphatase: 57 U/L (ref 38–126)
Anion gap: 12 (ref 5–15)
BUN: 16 mg/dL (ref 8–23)
CO2: 24 mmol/L (ref 22–32)
Calcium: 9.1 mg/dL (ref 8.9–10.3)
Chloride: 107 mmol/L (ref 98–111)
Creatinine: 0.81 mg/dL (ref 0.44–1.00)
GFR, Est AFR Am: >60 mL/min (ref >60)
GFR, Estimated: >60 mL/min (ref >60)
Glucose, Bld: 102 mg/dL — ABNORMAL HIGH (ref 70–99)
Potassium: 4.5 mmol/L (ref 3.5–5.1)
Sodium: 143 mmol/L (ref 135–145)
Total Bilirubin: 0.3 mg/dL (ref 0.3–1.2)
Total Protein: 6.6 g/dL (ref 6.5–8.1)

## 2019-01-13 LAB — CBC WITH DIFFERENTIAL (CANCER CENTER ONLY)
Abs Immature Granulocytes: 0.01 10*3/uL (ref 0.00–0.07)
Basophils Absolute: 0 10*3/uL (ref 0.0–0.1)
Basophils Relative: 1 %
Eosinophils Absolute: 0.4 10*3/uL (ref 0.0–0.5)
Eosinophils Relative: 7 %
HCT: 39.1 % (ref 36.0–46.0)
Hemoglobin: 12.9 g/dL (ref 12.0–15.0)
Immature Granulocytes: 0 %
Lymphocytes Relative: 35 %
Lymphs Abs: 1.9 10*3/uL (ref 0.7–4.0)
MCH: 30.6 pg (ref 26.0–34.0)
MCHC: 33 g/dL (ref 30.0–36.0)
MCV: 92.9 fL (ref 80.0–100.0)
Monocytes Absolute: 0.5 10*3/uL (ref 0.1–1.0)
Monocytes Relative: 10 %
Neutro Abs: 2.5 10*3/uL (ref 1.7–7.7)
Neutrophils Relative %: 47 %
Platelet Count: 294 10*3/uL (ref 150–400)
RBC: 4.21 MIL/uL (ref 3.87–5.11)
RDW: 13.4 % (ref 11.5–15.5)
WBC Count: 5.3 10*3/uL (ref 4.0–10.5)
nRBC: 0 % (ref 0.0–0.2)

## 2019-01-14 LAB — CANCER ANTIGEN 27.29: CA 27.29: 35.3 U/mL (ref 0.0–38.6)

## 2019-01-15 ENCOUNTER — Other Ambulatory Visit: Payer: Self-pay

## 2019-01-15 ENCOUNTER — Encounter: Payer: Self-pay | Admitting: Internal Medicine

## 2019-01-15 ENCOUNTER — Inpatient Hospital Stay (HOSPITAL_BASED_OUTPATIENT_CLINIC_OR_DEPARTMENT_OTHER): Payer: Medicare Other | Admitting: Internal Medicine

## 2019-01-15 VITALS — BP 125/80 | HR 77 | Temp 98.0°F | Resp 17 | Ht 64.0 in | Wt 160.3 lb

## 2019-01-15 DIAGNOSIS — Z79810 Long term (current) use of selective estrogen receptor modulators (SERMs): Secondary | ICD-10-CM | POA: Diagnosis not present

## 2019-01-15 DIAGNOSIS — C3412 Malignant neoplasm of upper lobe, left bronchus or lung: Secondary | ICD-10-CM | POA: Diagnosis not present

## 2019-01-15 DIAGNOSIS — Z170 Estrogen receptor positive status [ER+]: Secondary | ICD-10-CM | POA: Diagnosis not present

## 2019-01-15 DIAGNOSIS — Z803 Family history of malignant neoplasm of breast: Secondary | ICD-10-CM | POA: Diagnosis not present

## 2019-01-15 DIAGNOSIS — D0511 Intraductal carcinoma in situ of right breast: Secondary | ICD-10-CM

## 2019-01-15 DIAGNOSIS — C50111 Malignant neoplasm of central portion of right female breast: Secondary | ICD-10-CM | POA: Diagnosis not present

## 2019-01-15 DIAGNOSIS — F419 Anxiety disorder, unspecified: Secondary | ICD-10-CM | POA: Diagnosis not present

## 2019-01-15 DIAGNOSIS — R978 Other abnormal tumor markers: Secondary | ICD-10-CM | POA: Diagnosis not present

## 2019-01-15 DIAGNOSIS — M1990 Unspecified osteoarthritis, unspecified site: Secondary | ICD-10-CM | POA: Diagnosis not present

## 2019-01-15 NOTE — Progress Notes (Signed)
[facility] [phone]   [phone]  OFFICE PROGRESS NOTE  [name], MD [place] [id number]  DIAGNOSIS:  1) stage IIA non-small cell lung cancer diagnosed in March of 2006 2) recurrent ductal carcinoma in situ of the right breast initially diagnosed in January of 2014 with recurrence in October of 2014.  PRIOR THERAPY: 1) status post left trisegmentectomy with node dissection on 06/24/2004 2) status post 4 cycles of adjuvant chemotherapy with carboplatin and paclitaxel last dose was given 10/24/2004. 3) status post right partial mastectomy on 04/10/2012 under the care of Dr. Brantley Stage. 4) status post adjuvant radiotherapy to the right breast under the care of Dr. Valere Dross  CURRENT THERAPY: Tamoxifen 20 mg by mouth daily started in May of 2014.  INTERVAL HISTORY: [name] 68 y.o. female returns to the clinic today for follow-up visit.  The patient is feeling fine today with no concerning complaints.  She denied having any chest pain, shortness of breath, cough or hemoptysis.  She denied having any fever or chills.  She has no nausea, vomiting, diarrhea or constipation.  She denied having any headache or visual changes.  She is here today for evaluation and repeat blood work.  MEDICAL HISTORY: Past Medical History:  Diagnosis Date  . Anxiety   . Breast cancer ([place])    2014 and 2015  . Cancer ([place])    lung cancer   . DDD (degenerative disc disease)    neck  . Eczema   . Family history of breast cancer   . Family history of stomach cancer   . GERD (gastroesophageal reflux disease)    occasional - no current med.  Marland Kitchen History of breast cancer   . History of lung cancer    non-small cell - left upper lobe  . Osteoarthritis   . Personal history of radiation therapy 2014  . PONV (postoperative nausea and vomiting)   . Radiation 07/03/12-07/09/12   Right upper lung 5400 cGy  . Sinus headache    occasional  . Thyroid nodule, toxic or with hyperthyroidism    no current med.  . Urinary, incontinence, stress female     ALLERGIES:  is allergic to amoxicillin; asa [aspirin]; banana; ceftin [cefuroxime axetil]; citrus; lactose; lactose intolerance (gi); orange oil; penicillins; other; and tape.  MEDICATIONS:  Current Outpatient Medications  Medication Sig Dispense Refill  . acetaminophen (TYLENOL) 500 MG tablet Take 1,000 mg by mouth every 6 (six) hours as needed (arthritis).    . cyclobenzaprine (FLEXERIL) 10 MG tablet Take 1 tablet by mouth 3 (three) times daily as needed.  0  . cycloSPORINE (RESTASIS) 0.05 % ophthalmic emulsion 1 drop 2 (two) times daily.    . fluocinonide gel (LIDEX) 0.05 %     . fluticasone (FLONASE) 50 MCG/ACT nasal spray Place 1 spray into both nostrils daily.    Marland Kitchen ibuprofen (ADVIL,MOTRIN) 600 MG tablet Take 600 mg by mouth 2 (two) times daily as needed.   0  . Lactobacillus (ACIDOPHILUS) 0.5 MG TABS Take by mouth.    . meloxicam (MOBIC) 15 MG tablet Take 1 tablet by mouth daily as needed.  0  . Multiple Vitamin (MULTIVITAMIN) capsule Take 1 capsule by mouth.    Vladimir Faster Glycol-Propyl Glycol (SYSTANE OP) Apply 1 drop to eye as needed (dry eyes). Reported on 04/28/2015    . ranitidine (ZANTAC) 150 MG tablet Take 150 mg by mouth 2 (two) times  daily as needed.    . tamoxifen (NOLVADEX) 20 MG tablet TAKE 1 TABLET BY MOUTH EVERY DAY 30 tablet 5  . vitamin E 400 UNIT capsule Take 400 Units by mouth.     No current facility-administered medications for this visit.     SURGICAL HISTORY:  Past Surgical History:  Procedure Laterality Date  . APPENDECTOMY  1971  . BREAST LUMPECTOMY Right 2014  . BREAST RECONSTRUCTION WITH PLACEMENT OF TISSUE EXPANDER AND FLEX HD (ACELLULAR HYDRATED DERMIS) Right 05/20/2013   Procedure: IMMEDIATE RIGHT BREAST RECONSTRUCTION WITH LATISSAMUS MUSCLE FLAP AND PLACEMENT OF TISSUE EXPANDER TO RIGHT BREAST;  Surgeon: Theodoro Kos, DO;   Location: [facility];  Service: Plastics;  Laterality: Right;  . BREAST REDUCTION WITH MASTOPEXY Left 10/22/2013   Procedure: BREAST REDUCTION/MASTOPEXY TO LEFT BREAST WITH LIPOSUCTION/FAT GRAFTING FOR SYMMETRY;  Surgeon: Theodoro Kos, DO;  Location: [facility];  Service: Plastics;  Laterality: Left;  . DE QUERVAIN'S RELEASE Left 07/31/2002  . MASTECTOMY Right    2015  . MASTECTOMY W/ SENTINEL NODE BIOPSY Right 05/20/2013   Procedure: RIGHT SIMPLEMASTECTOMY AND SENTINEL NODE   Sampling;  Surgeon: Joyice Faster. Cornett, MD;  Location: [facility];  Service: General;  Laterality: Right;  . MOUTH SURGERY Right may 2014   "keratocystic odotogenic tumor" - X 2  . NASAL SINUS SURGERY  ?1988-1990   "at least 3" (05/21/2013)  . PARTIAL MASTECTOMY WITH NEEDLE LOCALIZATION  04/10/2012   Procedure: PARTIAL MASTECTOMY WITH NEEDLE LOCALIZATION;  Surgeon: Marcello Moores A. Cornett, MD;  Location: [facility];  Service: General;  Laterality: Right;  . REDUCTION MAMMAPLASTY Left 2015  . REMOVAL OF TISSUE EXPANDER AND PLACEMENT OF IMPLANT Right 10/22/2013   Procedure: REMOVAL OF RIGHT TISSUE EXPANDER WITH PLACEMENT OF RIGHT BREAST IMPLANT;  Surgeon: Theodoro Kos, DO;  Location: [facility];  Service: Plastics;  Laterality: Right;  . THORACOTOMY Left 07/14/2004  . TRIGGER FINGER RELEASE Bilateral    "I've had OR on q finger" (05/21/2013)  . TRIGGER FINGER RELEASE Right 07/14/1999   index and little fingers  . TRIGGER FINGER RELEASE Right 07/17/2002   long and ring fingers  . TRIGGER FINGER RELEASE Left 07/31/2002   little, ring and index fingers  . VIDEO ASSISTED THORACOSCOPY (VATS)/ LOBECTOMY Left 07/14/2004   upper tri-segmentectomy  . WISDOM TOOTH EXTRACTION     "3"    REVIEW OF SYSTEMS:  A comprehensive review of systems was negative.   PHYSICAL EXAMINATION: General appearance: alert, cooperative and no distress Head: Normocephalic, without obvious abnormality, atraumatic Neck: no  adenopathy Lymph nodes: Cervical, supraclavicular, and axillary nodes normal. Resp: clear to auscultation bilaterally Back: symmetric, no curvature. ROM normal. No CVA tenderness. Cardio: regular rate and rhythm, S1, S2 normal, no murmur, click, rub or gallop GI: soft, non-tender; bowel sounds normal; no masses,  no organomegaly Extremities: extremities normal, atraumatic, no cyanosis or edema  ECOG PERFORMANCE STATUS: 0 - Asymptomatic  Blood pressure 125/80, pulse 77, temperature 98 F (36.7 C), temperature source Oral, resp. rate 17, height 5\' 4"  (1.626 m), weight 160 lb 4.8 oz (72.7 kg), SpO2 100 %.  LABORATORY DATA: Lab Results  Component Value Date   WBC 5.3 01/13/2019   HGB 12.9 01/13/2019   HCT 39.1 01/13/2019   MCV 92.9 01/13/2019   PLT 294 01/13/2019      Chemistry      Component Value Date/Time   NA 143 01/13/2019 0901   NA 141 10/29/2016 0742   K 4.5  01/13/2019 0901   K 4.1 10/29/2016 0742   CL 107 01/13/2019 0901   CL 108 (H) 04/28/2012 1008   CO2 24 01/13/2019 0901   CO2 26 10/29/2016 0742   BUN 16 01/13/2019 0901   BUN 21.1 10/29/2016 0742   CREATININE 0.81 01/13/2019 0901   CREATININE 0.9 10/29/2016 0742      Component Value Date/Time   CALCIUM 9.1 01/13/2019 0901   CALCIUM 9.0 10/29/2016 0742   ALKPHOS 57 01/13/2019 0901   ALKPHOS 49 10/29/2016 0742   AST 15 01/13/2019 0901   AST 15 10/29/2016 0742   ALT 12 01/13/2019 0901   ALT 12 10/29/2016 0742   BILITOT 0.3 01/13/2019 0901   BILITOT 0.32 10/29/2016 0742       RADIOGRAPHIC STUDIES: US Thyroid  Result Date: 12/30/2018 CLINICAL DATA:  Benign neoplasm of the thyroid gland EXAM: THYROID ULTRASOUND TECHNIQUE: Ultrasound examination of the thyroid gland and adjacent soft tissues was performed. COMPARISON:  FNA dated 02/19/2018.  Ultrasound dated 02/04/2018. FINDINGS: Parenchymal Echotexture: Mildly heterogenous Isthmus: 0.2 cm Right lobe: 5.2 x 1.9 x 2.1 cm Left lobe: 4.7 x 1.7 x 1.2 cm  _________________________________________________________ Estimated total number of nodules >/= 1 cm: 3 Number of spongiform nodules >/=  2 cm not described below (TR1): 0 Number of mixed cystic and solid nodules >/= 1.5 cm not described below (TR2): 0 _________________________________________________________ Nodule # 1: Prior biopsy: No Location: Right; Superior Maximum size: 1.2 cm; Other 2 dimensions: 1 x 0.7 cm, previously, 1.2 x 1 x 0.8 cm Composition: solid/almost completely solid (2) Echogenicity: hypoechoic (2) Shape: not taller-than-wide (0) Margins: smooth (0) Echogenic foci: none (0) ACR TI-RADS total points: 4. ACR TI-RADS risk category:  TR4 (4-6 points). Significant change in size (>/= 20% in two dimensions and minimal increase of 2 mm): No Change in features: No Change in ACR TI-RADS risk category: No ACR TI-RADS recommendations: *Given size (>/= 1 - 1.4 cm) and appearance, a follow-up ultrasound in 1 year should be considered based on TI-RADS criteria. _________________________________________________________ Nodule # 2: Prior biopsy: Yes Location: Right; Mid Maximum size: 2.6 cm; Other 2 dimensions: 2.2 x 1.8 cm, previously, 2.6 x 2.5 x 1.7 cm Composition: solid/almost completely solid (2) Echogenicity: isoechoic (1) Shape: not taller-than-wide (0) Margins: smooth (0) Echogenic foci: none (0) ACR TI-RADS total points: 3. ACR TI-RADS risk category:  TR3 (3 points). Significant change in size (>/= 20% in two dimensions and minimal increase of 2 mm): No Change in features: No Change in ACR TI-RADS risk category: No ACR TI-RADS recommendations: **Given size (>/= 2.5 cm) and appearance, fine needle aspiration of this mildly suspicious nodule should be considered based on TI-RADS criteria. _________________________________________________________ Nodule # 3: Prior biopsy: No Location: Right; Inferior Maximum size: 1 cm; Other 2 dimensions: 1 x 0.7 cm, previously, 1 x 1 x 1 cm Composition: solid/almost  completely solid (2) Echogenicity: isoechoic (1). There is some shadowing which partially obscures the nodule, however it is favored to be isoechoic on the sagittal image. Shape: not taller-than-wide (0) Margins: smooth (0) Echogenic foci: none (0) ACR TI-RADS total points: 3. ACR TI-RADS risk category:  TR3 (3 points). Significant change in size (>/= 20% in two dimensions and minimal increase of 2 mm): No Change in features: Yes Change in ACR TI-RADS risk category: Yes ACR TI-RADS recommendations: Given size (<1.4 cm) and appearance, this nodule does NOT meet TI-RADS criteria for biopsy or dedicated follow-up. _________________________________________________________ Nodule # 4: Prior biopsy: No Location: Left; Mid Maximum  size: 0.7 cm; Other 2 dimensions: 0.6 x 0.5 cm, previously, 0.6 x 0.6 x 0.5 cm Composition: solid/almost completely solid (2) Echogenicity: hypoechoic (2) Shape: not taller-than-wide (0) Margins: smooth (0) Echogenic foci: none (0) ACR TI-RADS total points: 4. ACR TI-RADS risk category:  TR4 (4-6 points). Significant change in size (>/= 20% in two dimensions and minimal increase of 2 mm): No Change in features: No Change in ACR TI-RADS risk category: No ACR TI-RADS recommendations: Given size (<0.9 cm) and appearance, this nodule does NOT meet TI-RADS criteria for biopsy or dedicated follow-up. _________________________________________________________ IMPRESSION: 1. Stable 2.6 cm thyroid nodule in the right mid thyroid gland. This was previously biopsied. Correlation with biopsy results is recommended. 2. Stable 1.2 cm thyroid nodule in the right superior thyroid gland. This nodule meets criteria for 1 year follow-up. 3. Stable 1 cm nodule in the inferior right thyroid gland which has been down graded to a TR 3 nodule given that it now appears more isoechoic then hypoechoic. No further follow-up is required for this nodule. The above is in keeping with the ACR TI-RADS recommendations - J Am Coll  Radiol 2017;14:587-595. Electronically Signed   By: Constance Holster M.D.   On: 12/30/2018 10:21   ASSESSMENT AND PLAN:  This is a very pleasant 68 years old white female with stage IIA non-small cell lung cancer as well as recurrent breast ductal carcinoma in situ. The patient is status post right lumpectomy followed by adjuvant radiotherapy. She is currently on treatment with tamoxifen and tolerating it fairly well. The patient is doing fine today with no concerning complaints.  Her blood work is unremarkable. I will see the patient back for follow-up visit in 6 months with repeat blood work as well as chest x-ray. For the indigestion and enlargement of her thyroid, she is followed by her primary care physician.   The patient was advised to call immediately if she has any concerning symptoms in the interval. All questions were answered. The patient knows to call the clinic with any problems, questions or concerns. We can certainly see the patient much sooner if necessary. I spent 10 minutes counseling the patient face to face. The total time spent in the appointment was 15 minutes.  Disclaimer: This note was dictated with voice recognition software. Similar sounding words can inadvertently be transcribed and may be missed upon review.

## 2019-01-16 ENCOUNTER — Telehealth: Payer: Self-pay | Admitting: Internal Medicine

## 2019-01-16 NOTE — Telephone Encounter (Signed)
I left a message regarding schedule  

## 2019-01-29 IMAGING — US US THYROID
1 series · 12 of 25 positions shown · non-contrast
Comparison: None.

CLINICAL DATA: Goiter.

EXAM:
THYROID ULTRASOUND
TECHNIQUE: Ultrasound examination of the thyroid gland and adjacent soft
tissues was performed.

[Series 1: us thyroid · 0.04mm/px · 12 of 51 slices shown]
[im 3/51]
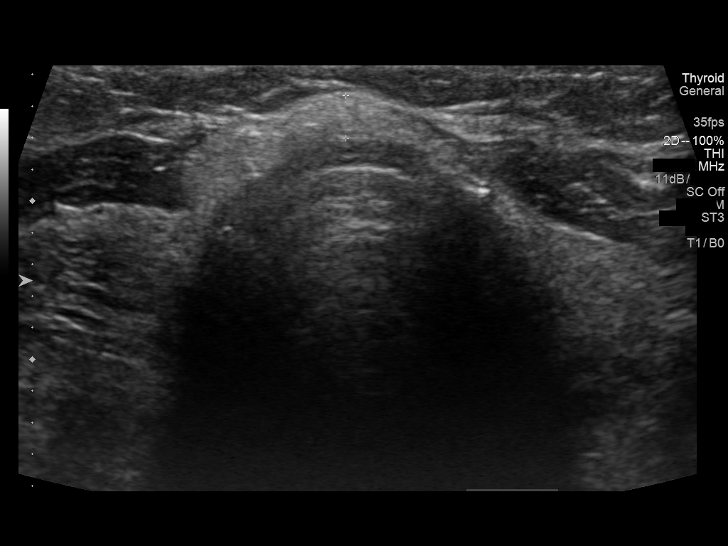
[im 7/51]
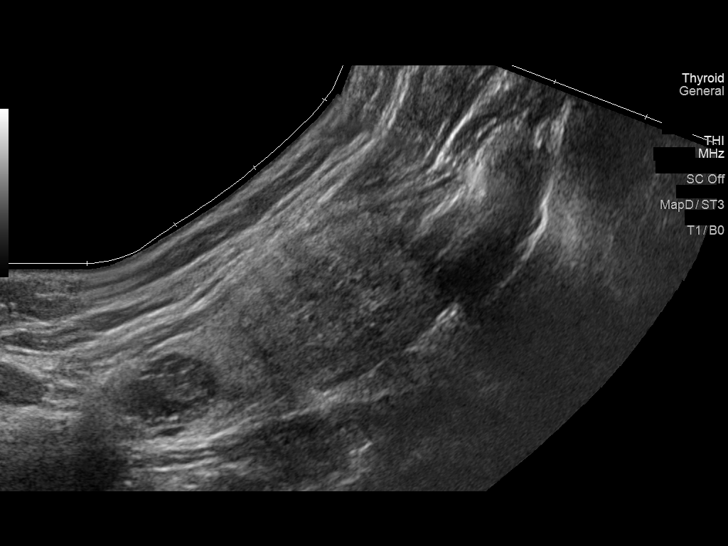
[im 11/51]
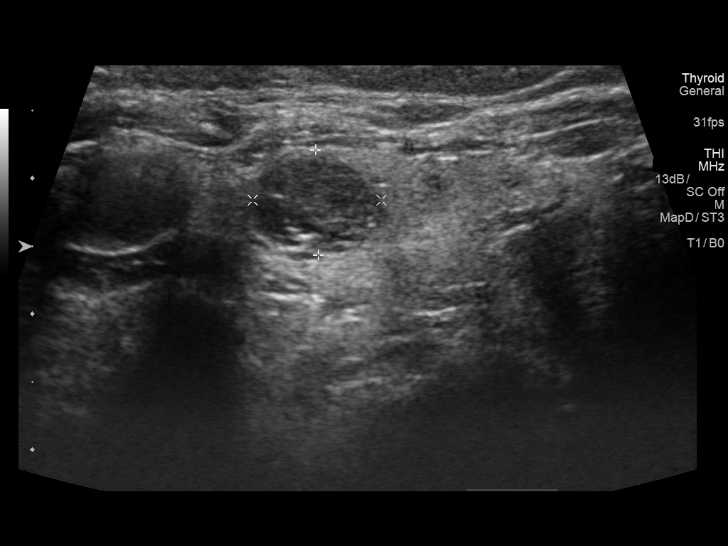
[im 15/51]
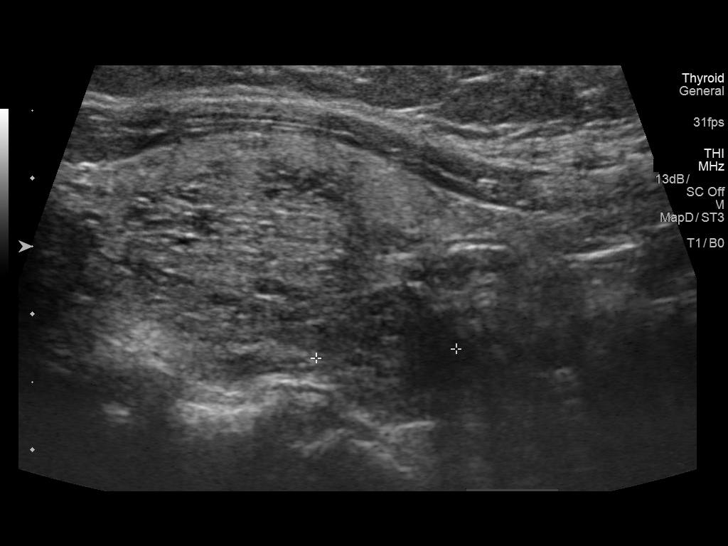
[im 19/51]
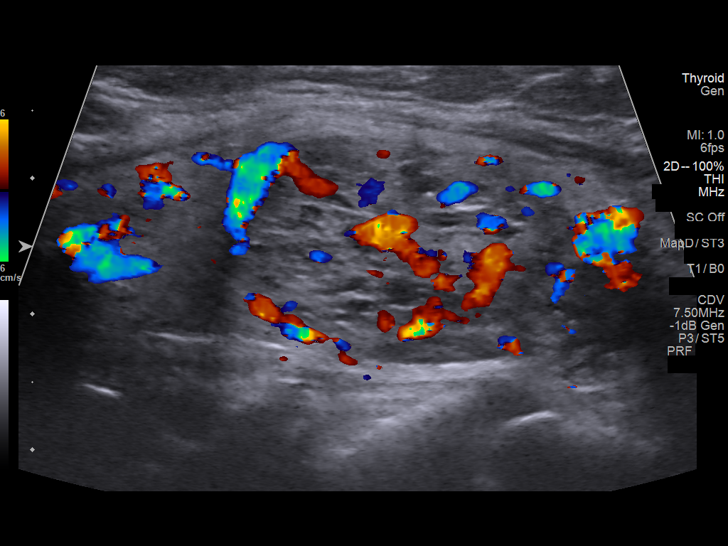
[im 23/51]
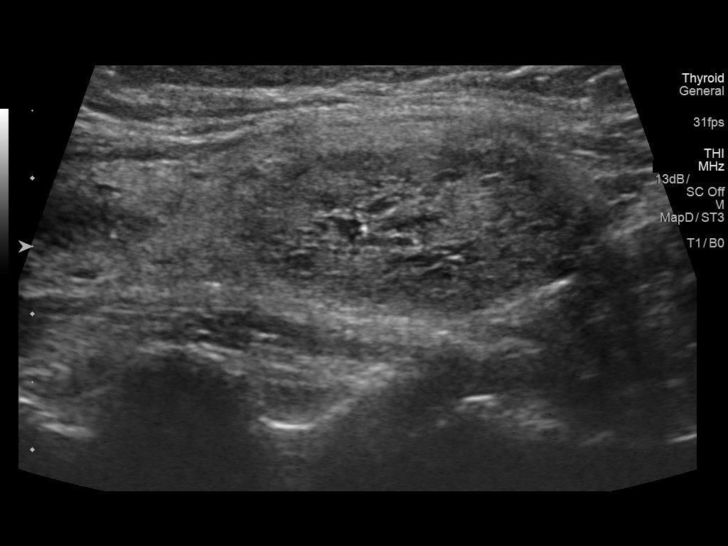
[im 28/51]
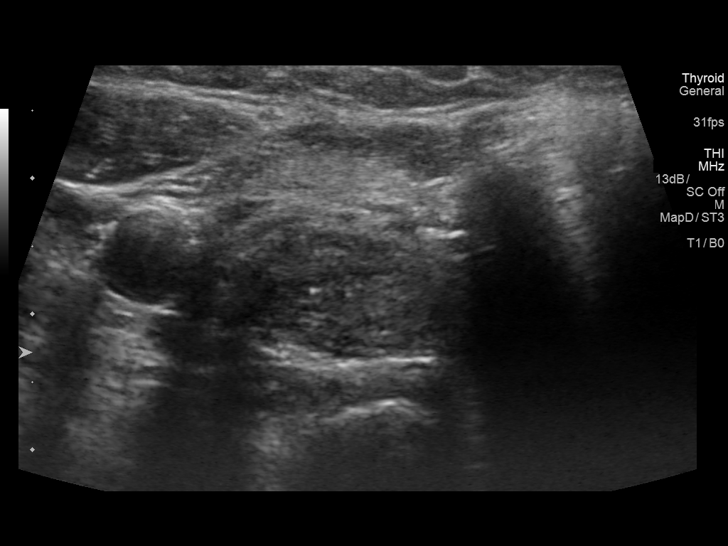
[im 32/51]
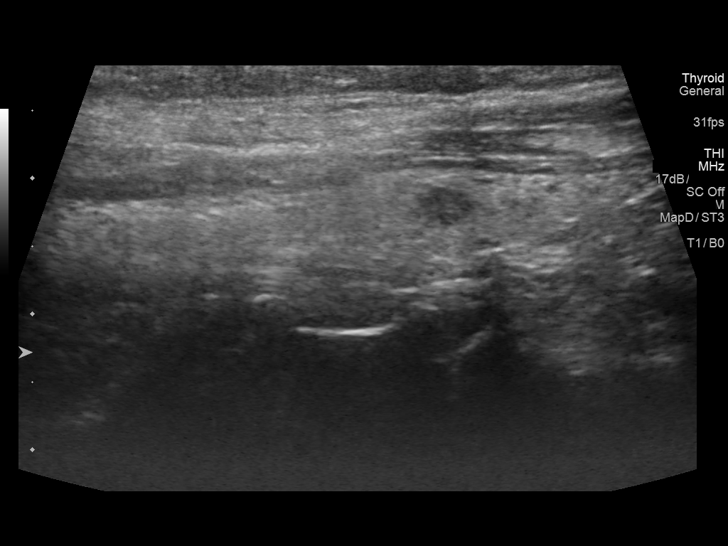
[im 36/51]
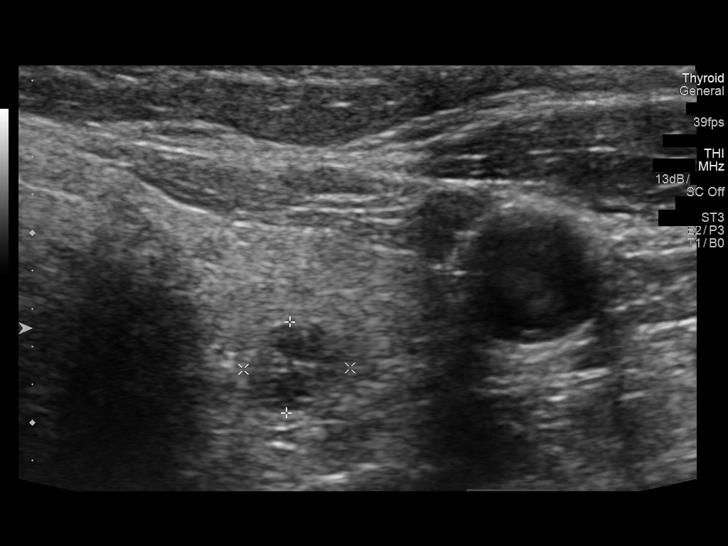
[im 40/51]
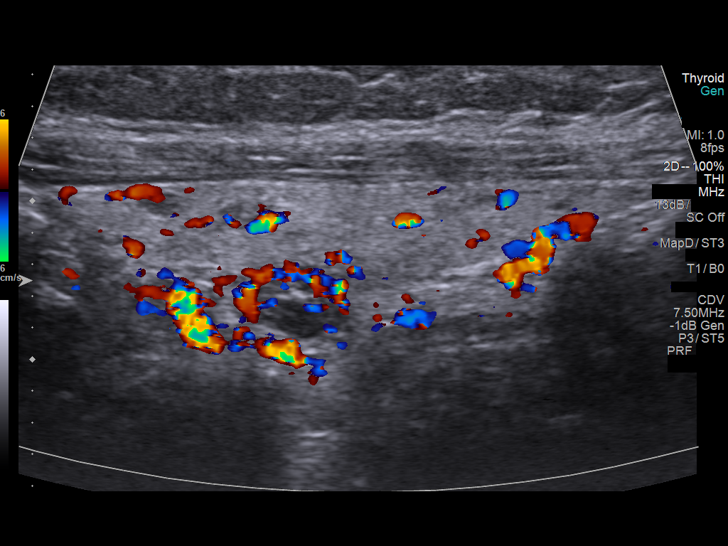
[im 44/51]
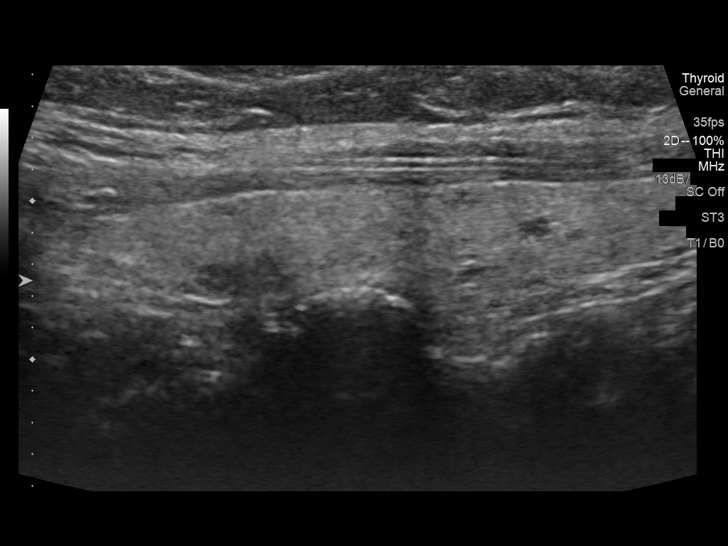
[im 48/51]
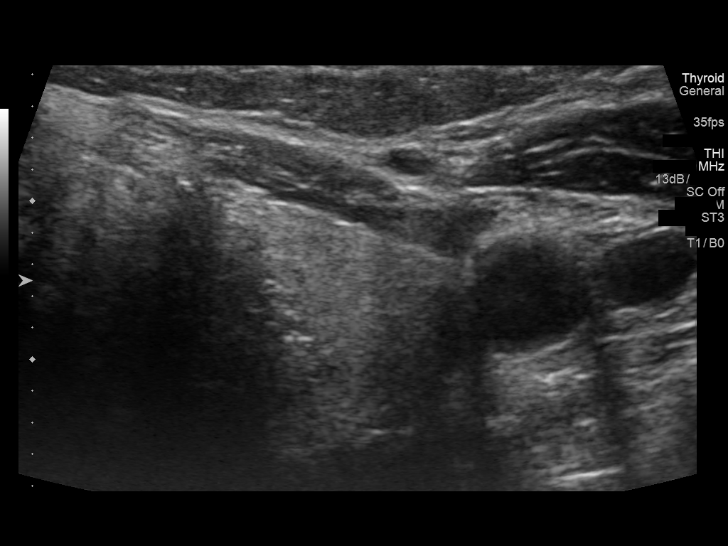

[12 of 25 positions shown; findings below may reference images not displayed]

FINDINGS: Parenchymal Echotexture: Moderately heterogenous

Isthmus: 0.3 cm

Right lobe: 4.7 x 1.6 x 2.2 cm

Left lobe: 4.6 x 1.2 x 1.2 cm

_________________________________________________________

Estimated total number of nodules >/= 1 cm: 3

Number of spongiform nodules >/=  2 cm not described below (TR1): 0

Number of mixed cystic and solid nodules >/= 1.5 cm not described
below (TR2): 0

_________________________________________________________

Nodule # 1:

Location: Right; Superior

Maximum size: 1.2 cm; Other 2 dimensions: 1.0 x 0.8 cm

Composition: solid/almost completely solid (2)

Echogenicity: hypoechoic (2)

Shape: not taller-than-wide (0)

Margins: smooth (0)

Echogenic foci: large comet-tail artifacts (0)

ACR TI-RADS total points: 4.

ACR TI-RADS risk category: TR4 (4-6 points).

ACR TI-RADS recommendations:

*Given size (>/= 1 - 1.4 cm) and appearance, a follow-up ultrasound
in 1 year should be considered based on TI-RADS criteria.

_________________________________________________________

Nodule # 2:

Location: Right; Inferior

Maximum size: 2.6 cm; Other 2 dimensions: 2.5 x 1.7 cm

Composition: solid/almost completely solid (2)

Echogenicity: isoechoic (1)

Shape: not taller-than-wide (0)

Margins: smooth (0)

Echogenic foci: none (0)

ACR TI-RADS total points: 3.

ACR TI-RADS risk category: TR3 (3 points).

ACR TI-RADS recommendations:

**Given size (>/= 2.5 cm) and appearance, fine needle aspiration of
this mildly suspicious nodule should be considered based on TI-RADS
criteria.

_________________________________________________________

Nodule # 3:

Location: Right; Inferior

Maximum size: 1.0 cm; Other 2 dimensions: 1.0 x 1.0 cm

Composition: solid/almost completely solid (2)

Echogenicity: hypoechoic (2)

Shape: not taller-than-wide (0)

Margins: ill-defined (0)

Echogenic foci: none (0)

ACR TI-RADS total points: 4.

ACR TI-RADS risk category: TR4 (4-6 points).

ACR TI-RADS recommendations:

*Given size (>/= 1 - 1.4 cm) and appearance, a follow-up ultrasound
in 1 year should be considered based on TI-RADS criteria.

_________________________________________________________

Multiple small hypoechoic nodules scattered throughout the left
gland, none of which measure larger than 1 cm.
IMPRESSION: 1. A 2.6 cm TI-RADS category 3 nodule in the right mid to lower
gland (labeled # 2) meets criteria for consideration of fine-needle
aspiration biopsy.
2. A 1.2 cm TI-RADS category 4 nodule in the right superior gland
(labeled # 1) meets criteria for follow-up ultrasound in 1 year.
3. A 1.0 cm TI-RADS category 4 nodule in the right inferior gland
(labeled # 3) meets criteria for follow-up ultrasound in 1 year.

The above is in keeping with the ACR TI-RADS recommendations - [HOSPITAL] [YY];[DATE].

## 2019-02-10 DIAGNOSIS — R1310 Dysphagia, unspecified: Secondary | ICD-10-CM | POA: Diagnosis not present

## 2019-02-10 DIAGNOSIS — E042 Nontoxic multinodular goiter: Secondary | ICD-10-CM | POA: Diagnosis not present

## 2019-02-10 DIAGNOSIS — Z8349 Family history of other endocrine, nutritional and metabolic diseases: Secondary | ICD-10-CM | POA: Diagnosis not present

## 2019-02-10 DIAGNOSIS — Z85118 Personal history of other malignant neoplasm of bronchus and lung: Secondary | ICD-10-CM | POA: Diagnosis not present

## 2019-02-10 DIAGNOSIS — Z853 Personal history of malignant neoplasm of breast: Secondary | ICD-10-CM | POA: Diagnosis not present

## 2019-02-10 DIAGNOSIS — R490 Dysphonia: Secondary | ICD-10-CM | POA: Diagnosis not present

## 2019-02-10 DIAGNOSIS — Z803 Family history of malignant neoplasm of breast: Secondary | ICD-10-CM | POA: Diagnosis not present

## 2019-02-13 IMAGING — US US FNA BIOPSY THYROID 1ST LESION
1 series · 13 of 15 positions shown · non-contrast
Comparison: US Thyroid [DATE]

MEDICATIONS:
5 cc 1% lidocaine

COMPLICATIONS:
None immediate.

INDICATION: Indeterminate thyroid nodule

Right inferior thyroid nodule
2.6 cm
Hx Breast and Lung Cancer
EXAM:
ULTRASOUND GUIDED FINE NEEDLE ASPIRATION OF INDETERMINATE THYROID
NODULE
TECHNIQUE: Informed written consent was obtained from the patient after a
discussion of the risks, benefits and alternatives to treatment.
Questions regarding the procedure were encouraged and answered. A
timeout was performed prior to the initiation of the procedure.

[Series 1: us fna biopsy thyroid 1st lesion · 0.06mm/px · 15 acquisitions, 13 frames shown]
[im 1/15]
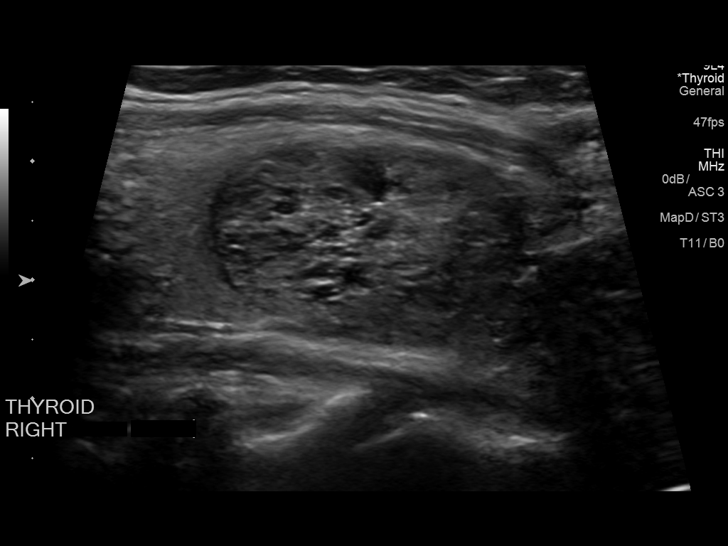
[im 2/15]
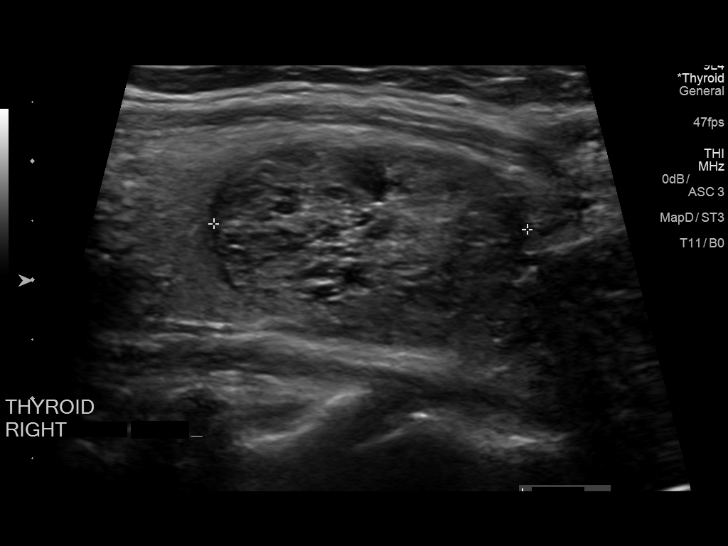
[im 3/15]
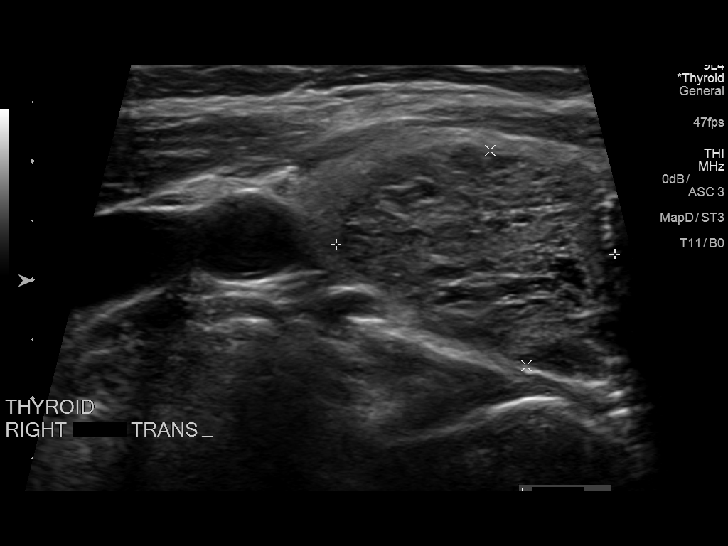
[im 5/15]
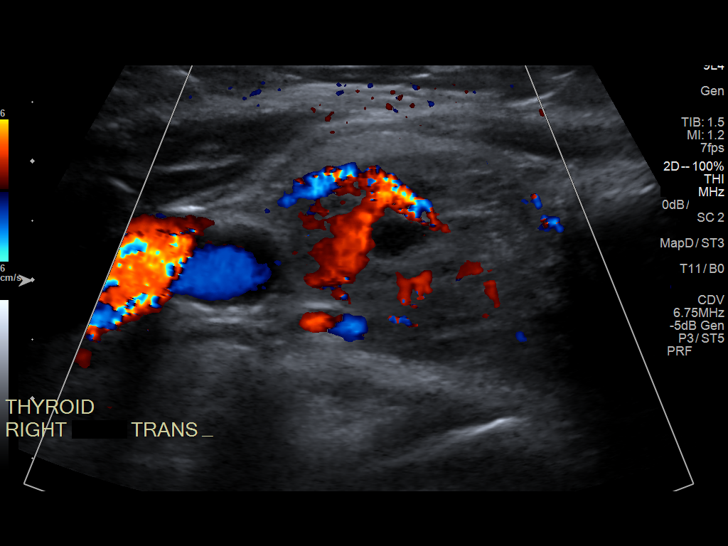
[im 6/15]
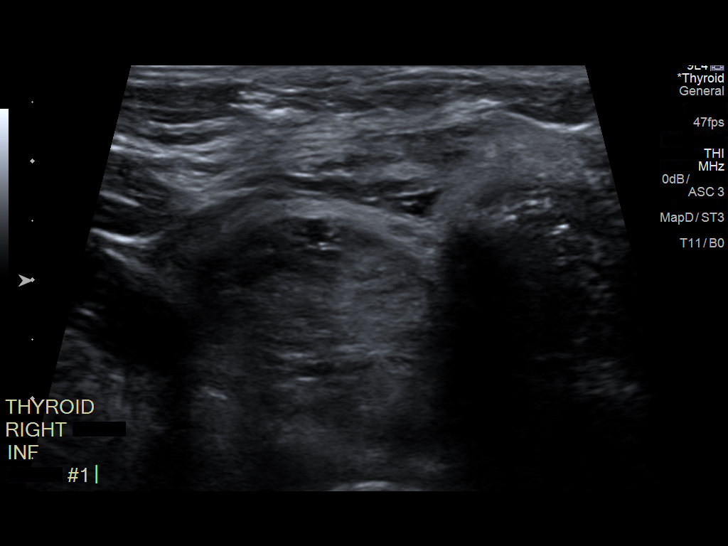
[im 7/15]
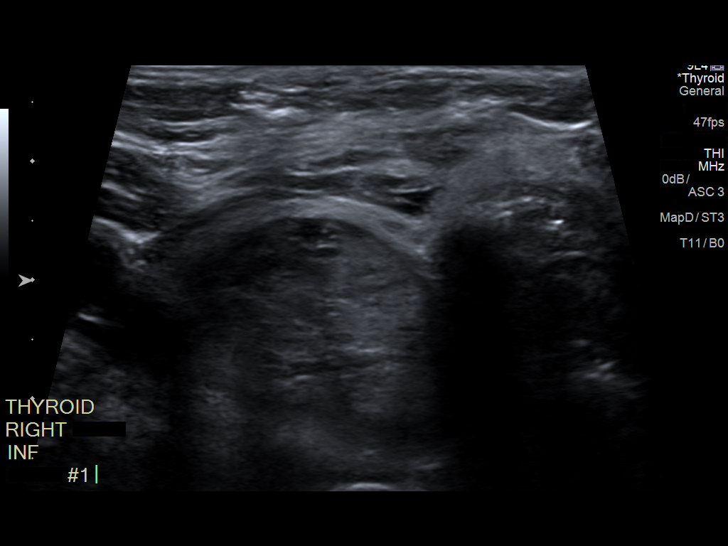
[im 8/15]
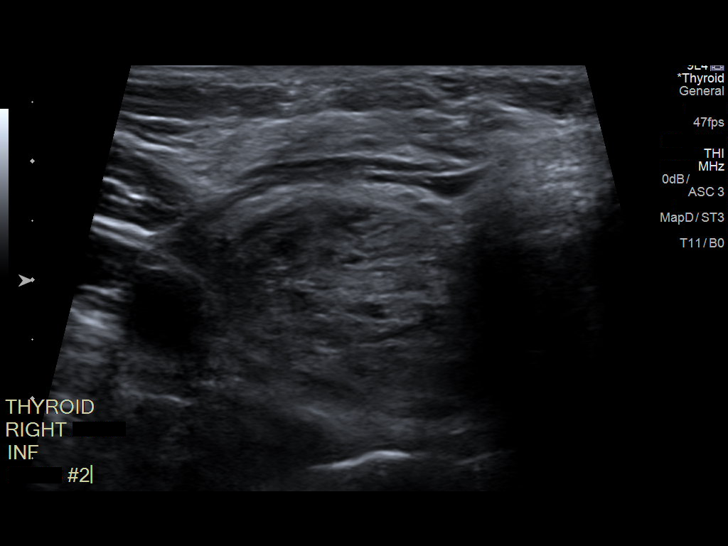
[im 9/15]
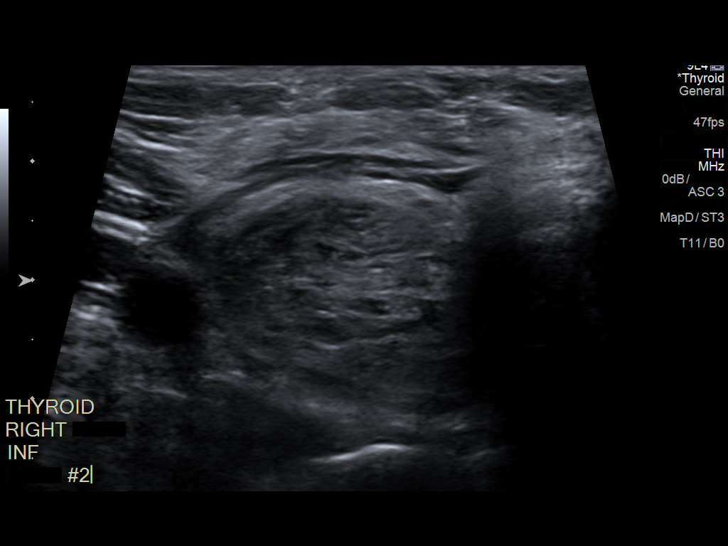
[im 10/15]
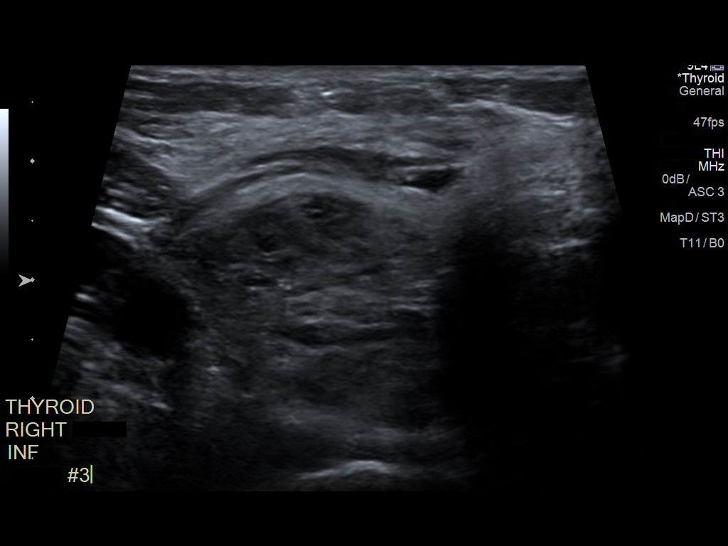
[im 11/15]
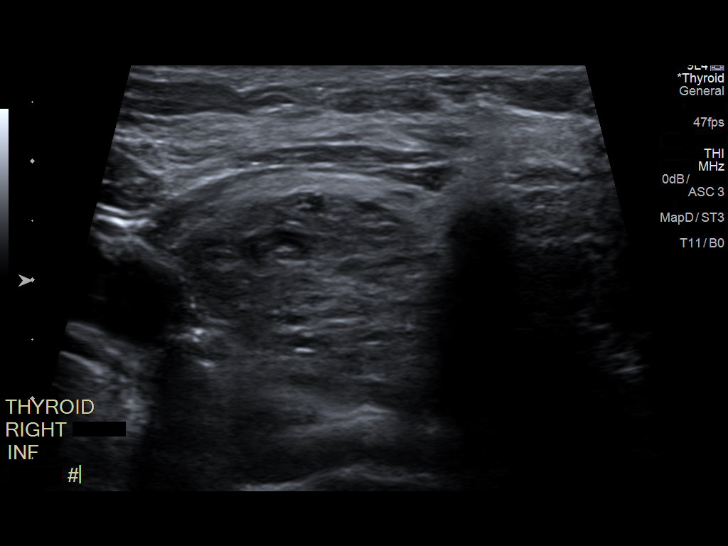
[im 13/15]
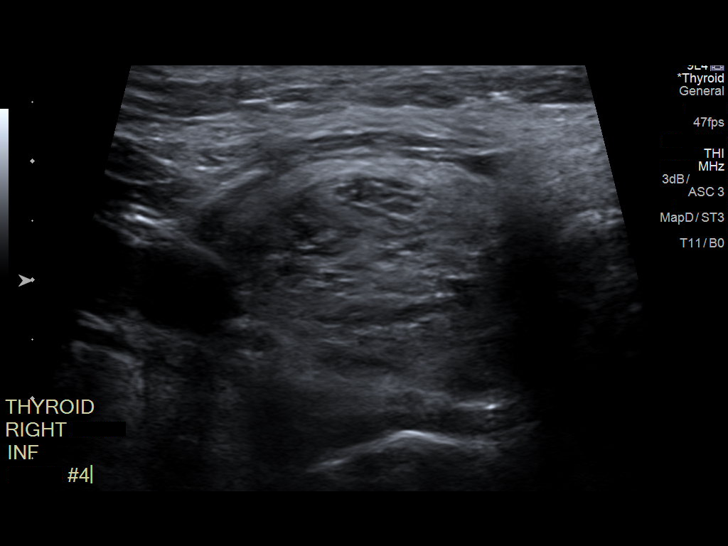
[im 14/15]
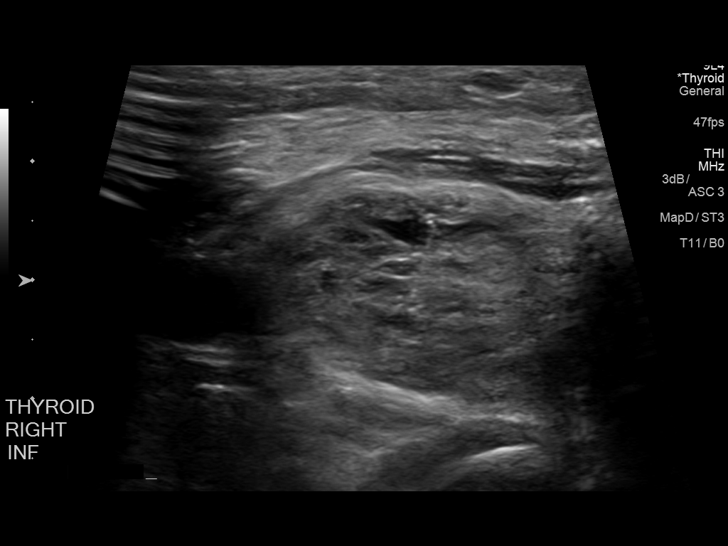
[im 15/15]
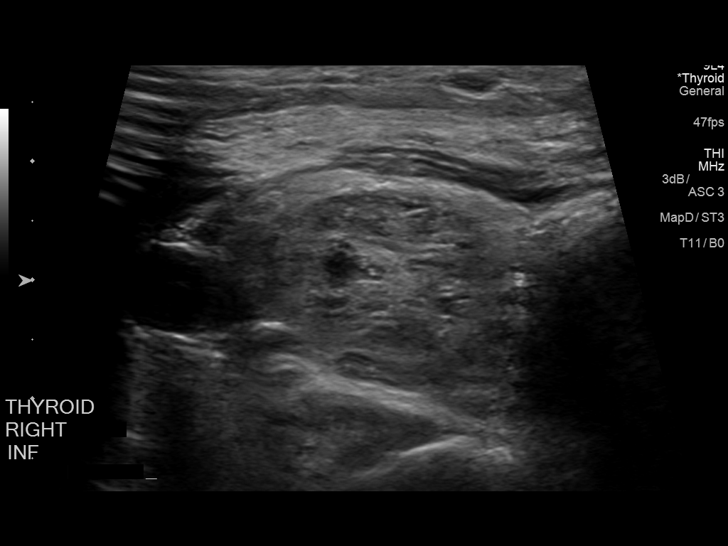

[13 of 15 positions shown; findings below may reference images not displayed]

Pre-procedural ultrasound scanning demonstrated unchanged size and
appearance of the indeterminate nodule within the right thyroid

The procedure was planned. The neck was prepped in the usual sterile
fashion, and a sterile drape was applied covering the operative
field. A timeout was performed prior to the initiation of the
procedure. Local anesthesia was provided with 1% lidocaine.

Under direct ultrasound guidance, 4 FNA biopsies were performed of
the right inferior thyroid nodule with a 25 gauge needle. Multiple
ultrasound images were saved for procedural documentation purposes.
The samples were prepared and submitted to pathology.

Limited post procedural scanning was negative for hematoma or
additional complication. Dressings were placed. The patient
tolerated the above procedures procedure well without immediate
postprocedural complication.
FINDINGS: Nodule reference number based on prior diagnostic ultrasound: 2

Maximum size: 2.6 cm

Location: Right; Inferior

ACR TI-RADS risk category: TR3 (3 points)

Reason for biopsy: meets ACR TI-RADS criteria

Ultrasound imaging confirms appropriate placement of the needles
within the thyroid nodule.
IMPRESSION: Technically successful ultrasound guided fine needle aspiration of
right inferior thyroid nodule

Read by

KRISHNA

## 2019-02-17 DIAGNOSIS — M79661 Pain in right lower leg: Secondary | ICD-10-CM | POA: Diagnosis not present

## 2019-03-05 DIAGNOSIS — D164 Benign neoplasm of bones of skull and face: Secondary | ICD-10-CM | POA: Diagnosis not present

## 2019-04-28 ENCOUNTER — Ambulatory Visit: Payer: Medicare Other

## 2019-05-03 ENCOUNTER — Ambulatory Visit: Payer: Medicare Other

## 2019-05-06 ENCOUNTER — Ambulatory Visit (INDEPENDENT_AMBULATORY_CARE_PROVIDER_SITE_OTHER): Payer: Medicare Other | Admitting: Neurology

## 2019-05-06 ENCOUNTER — Other Ambulatory Visit: Payer: Self-pay

## 2019-05-06 ENCOUNTER — Encounter: Payer: Self-pay | Admitting: Neurology

## 2019-05-06 VITALS — BP 130/70 | HR 71 | Temp 97.0°F | Ht 64.0 in | Wt 161.0 lb

## 2019-05-06 DIAGNOSIS — M6289 Other specified disorders of muscle: Secondary | ICD-10-CM

## 2019-05-06 DIAGNOSIS — R292 Abnormal reflex: Secondary | ICD-10-CM | POA: Diagnosis not present

## 2019-05-06 DIAGNOSIS — R531 Weakness: Secondary | ICD-10-CM

## 2019-05-06 DIAGNOSIS — H532 Diplopia: Secondary | ICD-10-CM

## 2019-05-06 DIAGNOSIS — R209 Unspecified disturbances of skin sensation: Secondary | ICD-10-CM

## 2019-05-06 DIAGNOSIS — M6281 Muscle weakness (generalized): Secondary | ICD-10-CM

## 2019-05-06 DIAGNOSIS — R202 Paresthesia of skin: Secondary | ICD-10-CM

## 2019-05-06 DIAGNOSIS — H02403 Unspecified ptosis of bilateral eyelids: Secondary | ICD-10-CM | POA: Diagnosis not present

## 2019-05-06 NOTE — Patient Instructions (Signed)
MRI of the brain and cervical spine Blood work EMG/NCS   Electromyoneurogram Electromyoneurogram is a test to check how well your muscles and nerves are working. This procedure includes the combined use of electromyogram (EMG) and nerve conduction study (NCS). EMG is used to look for muscular disorders. NCS, which is also called electroneurogram, measures how well your nerves are controlling your muscles. The procedures are usually done together to check if your muscles and nerves are healthy. If the results of the tests are abnormal, this may indicate disease or injury, such as a neuromuscular disease or peripheral nerve damage. Tell a health care provider about:  Any allergies you have.  All medicines you are taking, including vitamins, herbs, eye drops, creams, and over-the-counter medicines.  Any problems you or family members have had with anesthetic medicines.  Any blood disorders you have.  Any surgeries you have had.  Any medical conditions you have.  If you have a pacemaker.  Whether you are pregnant or may be pregnant. What are the risks? Generally, this is a safe procedure. However, problems may occur, including:  Infection where the electrodes were inserted.  Bleeding. What happens before the procedure? Medicines Ask your health care provider about:  Changing or stopping your regular medicines. This is especially important if you are taking diabetes medicines or blood thinners.  Taking medicines such as aspirin and ibuprofen. These medicines can thin your blood. Do not take these medicines unless your health care provider tells you to take them.  Taking over-the-counter medicines, vitamins, herbs, and supplements. General instructions  Your health care provider may ask you to avoid: ? Beverages that have caffeine, such as coffee and tea. ? Any products that contain nicotine or tobacco. These products include cigarettes, e-cigarettes, and chewing tobacco. If you  need help quitting, ask your health care provider.  Do not use lotions or creams on the same day that you will be having the procedure. What happens during the procedure? For EMG   Your health care provider will ask you to stay in a position so that he or she can access the muscle that will be studied. You may be standing, sitting, or lying down.  You may be given a medicine that numbs the area (local anesthetic).  A very thin needle that has an electrode will be inserted into your muscle.  Another small electrode will be placed on your skin near the muscle.  Your health care provider will ask you to continue to remain still.  The electrodes will send a signal that tells about the electrical activity of your muscles. You may see this on a monitor or hear it in the room.  After your muscles have been studied at rest, your health care provider will ask you to contract or flex your muscles. The electrodes will send a signal that tells about the electrical activity of your muscles.  Your health care provider will remove the electrodes and the electrode needles when the procedure is finished. The procedure may vary among health care providers and hospitals. For NCS   An electrode that records your nerve activity (recording electrode) will be placed on your skin by the muscle that is being studied.  An electrode that is used as a reference (reference electrode) will be placed near the recording electrode.  A paste or gel will be applied to your skin between the recording electrode and the reference electrode.  Your nerve will be stimulated with a mild shock. Your health care provider  will measure how much time it takes for your muscle to react.  Your health care provider will remove the electrodes and the gel when the procedure is finished. The procedure may vary among health care providers and hospitals. What happens after the procedure?  It is up to you to get the results of your  procedure. Ask your health care provider, or the department that is doing the procedure, when your results will be ready.  Your health care provider may: ? Give you medicines for any pain. ? Monitor the insertion sites to make sure that bleeding stops. Summary  Electromyoneurogram is a test to check how well your muscles and nerves are working.  If the results of the tests are abnormal, this may indicate disease or injury.  This is a safe procedure. However, problems may occur, such as bleeding and infection.  Your health care provider will do two tests to complete this procedure. One checks your muscles (EMG) and another checks your nerves (NCS).  It is up to you to get the results of your procedure. Ask your health care provider, or the department that is doing the procedure, when your results will be ready. This information is not intended to replace advice given to you by your health care provider. Make sure you discuss any questions you have with your health care provider. Document Revised: 11/26/2017 Document Reviewed: 11/08/2017 Elsevier Patient Education  [place].

## 2019-05-06 NOTE — Progress Notes (Signed)
[facility]    Provider:  Dr Jaynee Eagles Requesting Provider: [name], MD Primary Care Provider:  [name], MD  CC:  Neuropathy, weakness  HPI:  [name] is a 69 y.o. female here as requested by [name], MD for parestesias and weakness in the upper and lower extremities. PMHx neuropathy diagnosed 16 years ago, multinodular goiter, lung cancer status post right upper lung radiation March 2006 stage II with dry segment ectomy with lymph node dissection followed by 4 cycles of chemotherapy, osteoarthritis, degenerative disc disease, anxiety, breast cancer.  I reviewed Dr. Doreene Adas notes, patient has, random electrical feeling zaps in various parts of her body".  She has paresthesias at times.  A low level, "jitteriness", she has had a couple muscle tears over the past year, and her gastroc muscle doing better after injection, she is fatigued, she has degenerative disc disease and worried this is her spine, she feels her muscles are not as strong, started for her to lift a laundry basket, going on for months, she follow-up with oncology last October, occasional hot flashes, she did see neurology in the past and had normal nerve conduction EMG.  Labs reviewed, RA was negative, TSH normal, ANA negative, sed rate 10, CPK 23. Patient says she has been telling her doctor for years that she does "not feel right", she thought it was her thyroid but her bloodwork was all normal, all her blood work is fine, blood pressure is fine, EKG fine, she has "spells" like a car idling she feels low-level jitteriness like a car idling, continuous, can't see it, at rest and with action, she feels "weird no one can figure it out" she gets zps and ping in arms and legs and eye that are random and can be all over, her wrist, Generalized weakness like carrying a laundry basket up the stairs, ongoing for 2 years stable, she walks 3-5 miles a week. She had an emg/ncs in the past 16 years ago  for similar symptoms of paresthesias in the hands and it was normal, she does get electric chills.itches/bubble and can be anywhere primary legs but also in the arms and even her face. She has had 1-2 episodes of double vision also when she wakes up she feels like she has droopy eyelids more in the morning when rested. No other focal neurologic deficits, associated symptoms, inciting events or modifiable factors.  Reviewed notes, labs and imaging from outside physicians, which showed:  CT head 2006 showed No acute intracranial abnormalities including mass lesion or mass effect, hydrocephalus, extra-axial fluid collection, midline shift, hemorrhage, or acute infarction, large ischemic events (personally reviewed images)   Review of Systems: Patient complains of symptoms per HPI as well as the following symptoms: Fatigue, eye pain, feeling hot, feeling cold, numbness, weakness, tremor, joint pain, aching muscles, ringing in ears, itching. Pertinent negatives and positives per HPI. All others negative.   Social History   Socioeconomic History   Marital status: Married    Spouse name: Not on file   Number of children: 1   Years of education: Not on file   Highest education level: Not on file  Occupational History    Employer: [employer]  Tobacco Use   Smoking status: Former Smoker    Quit date: 05/06/2004    Years since quitting: 15.0   Smokeless tobacco: Never Used  Substance and Sexual Activity   Alcohol use: Yes    Alcohol/week: 7.0 standard drinks    Types: 7  Glasses of wine per week    Comment: 1 glass wine daily   Drug use: Never   Sexual activity: Yes  Other Topics Concern   Not on file  Social History Narrative   Lives at home with spouse   Right handed   Caffeine: 2-3 cups of coffee/day   Social Determinants of Health   Financial Resource Strain:    Difficulty of Paying Living Expenses: Not on file  Food Insecurity:    Worried About Paediatric nurse in the Last Year: Not on file   [employer] of Food in the Last Year: Not on file  Transportation Needs:    Lack of Transportation (Medical): Not on file   Lack of Transportation (Non-Medical): Not on file  Physical Activity:    Days of Exercise per Week: Not on file   Minutes of Exercise per Session: Not on file  Stress:    Feeling of Stress : Not on file  Social Connections:    Frequency of Communication with Friends and Family: Not on file   Frequency of Social Gatherings with Friends and Family: Not on file   Attends Religious Services: Not on file   Active Member of Clubs or Organizations: Not on file   Attends Archivist Meetings: Not on file   Marital Status: Not on file  Intimate Partner Violence:    Fear of Current or Ex-Partner: Not on file   Emotionally Abused: Not on file   Physically Abused: Not on file   Sexually Abused: Not on file    Family History  Problem Relation Age of Onset   Breast cancer Sister        BRCA testing in 2006 timeframe is neg   Thyroid nodules Sister    Breast cancer Maternal Aunt        dx >50   Thyroid nodules Maternal Aunt    Anesthesia problems Daughter        post-op N/V   Leukemia Father        CML, d. 32s   High blood pressure Father    Hearing loss Father    Anesthesia problems Mother        post-op N/V   Thyroid nodules Mother    Other Mother        DDD, spinal stenosis   High blood pressure Mother    Hearing loss Mother    Thyroid nodules Sister    Hearing loss Paternal Aunt    Hearing loss Paternal Uncle    Stomach cancer Maternal Grandmother    Thyroid nodules Maternal Aunt    Stomach cancer Maternal Great-grandmother        assumed stomach cancer   Hearing loss Brother     Past Medical History:  Diagnosis Date   Anxiety    Breast cancer ([place])    2014 and 2015   Cancer ([place])    lung cancer    DDD (degenerative disc disease)    neck   Dry eye     Eczema    Family history of breast cancer    Family history of stomach cancer    GERD (gastroesophageal reflux disease)    occasional - no current med.   History of breast cancer    History of lung cancer    non-small cell - left upper lobe   IBS (irritable bowel syndrome)    Non-allergic rhinitis    sinusitis   Osteoarthritis    Personal history of  radiation therapy 2014   PONV (postoperative nausea and vomiting)    Radiation 07/03/12-07/09/12   Right upper lung 5400 cGy   Sinus headache    occasional   Thyroid nodule, toxic or with hyperthyroidism    no current med.   Urinary, incontinence, stress female    Vasomotor rhinitis    Vertigo     Patient Active Problem List   Diagnosis Date Noted   Genetic testing 05/09/2018   Family history of breast cancer    Family history of stomach cancer    Post-operative state 06/01/2013   Lung cancer, diagnosed in March of 2006 stage II a status post left trisegmentectomy with lymph node dissection followed by 4 cycles of adjuvant chemotherapy 04/28/2012   Ductal carcinoma in situ of right breast diagnosed in December of 2013, status post right lumpectomy in January of 2014 04/28/2012   Breast cancer ([place]) 04/10/2012    Past Surgical History:  Procedure Laterality Date   APPENDECTOMY  1971   BREAST LUMPECTOMY Right 2014   BREAST RECONSTRUCTION WITH PLACEMENT OF TISSUE EXPANDER AND FLEX HD (ACELLULAR HYDRATED DERMIS) Right 05/20/2013   Procedure: IMMEDIATE RIGHT BREAST RECONSTRUCTION WITH LATISSAMUS MUSCLE FLAP AND PLACEMENT OF TISSUE EXPANDER TO RIGHT BREAST;  Surgeon: Theodoro Kos, DO;  Location: [facility];  Service: Plastics;  Laterality: Right;   BREAST REDUCTION WITH MASTOPEXY Left 10/22/2013   Procedure: BREAST REDUCTION/MASTOPEXY TO LEFT BREAST WITH LIPOSUCTION/FAT GRAFTING FOR SYMMETRY;  Surgeon: Theodoro Kos, DO;  Location: [facility];  Service: Plastics;  Laterality: Left;   DE QUERVAIN'S  RELEASE Left 07/31/2002   ganglion cyst R hand     1980s   KNEE SURGERY Left    torn meniscus   LUNG REMOVAL, PARTIAL     2014   MASTECTOMY Right    2015   MASTECTOMY W/ SENTINEL NODE BIOPSY Right 05/20/2013   Procedure: RIGHT SIMPLEMASTECTOMY AND SENTINEL NODE   Sampling;  Surgeon: Joyice Faster. Cornett, MD;  Location: [facility];  Service: General;  Laterality: Right;   MOUTH SURGERY Right may 2014   "keratocystic odotogenic tumor" - X 2   NASAL SINUS SURGERY  ?1988-1990   "at least 3" (05/21/2013)   PARTIAL MASTECTOMY WITH NEEDLE LOCALIZATION  04/10/2012   Procedure: PARTIAL MASTECTOMY WITH NEEDLE LOCALIZATION;  Surgeon: Marcello Moores A. Cornett, MD;  Location: [facility];  Service: General;  Laterality: Right;   REDUCTION MAMMAPLASTY Left 2015   REMOVAL OF TISSUE EXPANDER AND PLACEMENT OF IMPLANT Right 10/22/2013   Procedure: REMOVAL OF RIGHT TISSUE EXPANDER WITH PLACEMENT OF RIGHT BREAST IMPLANT;  Surgeon: Theodoro Kos, DO;  Location: [facility];  Service: Plastics;  Laterality: Right;   SEPTOPLASTY     & sinus surgeries 1980s-1990s   THORACOTOMY Left 07/14/2004   TRIGGER FINGER RELEASE Bilateral    "I've had OR on q finger" (05/21/2013)   TRIGGER FINGER RELEASE Right 07/14/1999   index and little fingers   TRIGGER FINGER RELEASE Right 07/17/2002   long and ring fingers   TRIGGER FINGER RELEASE Left 07/31/2002   little, ring and index fingers   VIDEO ASSISTED THORACOSCOPY (VATS)/ LOBECTOMY Left 07/14/2004   upper tri-segmentectomy   WISDOM TOOTH EXTRACTION     "3"    Current Outpatient Medications  Medication Sig Dispense Refill   acetaminophen (TYLENOL) 500 MG tablet Take 1,000 mg by mouth at bedtime as needed (arthritis).      cycloSPORINE (RESTASIS) 0.05 % ophthalmic emulsion 1 drop 2 (two) times daily.  esomeprazole (NEXIUM) 20 MG capsule Take 20 mg by mouth daily at 12 noon.     fluocinonide gel (LIDEX) 0.05 %      fluticasone (FLONASE)  50 MCG/ACT nasal spray Place 1 spray into both nostrils daily.     Lactobacillus (ACIDOPHILUS PO) Take 5 mg by mouth daily.     Lactobacillus (ACIDOPHILUS) 0.5 MG TABS Take by mouth.     Multiple Vitamin (MULTIVITAMIN) capsule Take 1 capsule by mouth.     nabumetone (RELAFEN) 500 MG tablet Take 500 mg by mouth 2 (two) times daily as needed (arthritis).     Polyethyl Glycol-Propyl Glycol (SYSTANE OP) Apply 1 drop to eye as needed (dry eyes). Reported on 04/28/2015     Propylene Glycol (SYSTANE BALANCE OP) Apply to eye as needed.     tamoxifen (NOLVADEX) 20 MG tablet TAKE 1 TABLET BY MOUTH EVERY DAY 30 tablet 5   vitamin E 400 UNIT capsule Take 400 Units by mouth.     cyclobenzaprine (FLEXERIL) 10 MG tablet Take 1 tablet by mouth 3 (three) times daily as needed.  0   ibuprofen (ADVIL,MOTRIN) 600 MG tablet Take 600 mg by mouth 2 (two) times daily as needed.   0   No current facility-administered medications for this visit.    Allergies as of 05/06/2019 - Review Complete 05/06/2019  Allergen Reaction Noted   Amoxicillin Hives 04/04/2012   Asa [aspirin] Other (See Comments) 03/14/2012   Banana Other (See Comments) 04/04/2012   Ceftin [cefuroxime axetil] Diarrhea 04/28/2012   Citrus Other (See Comments) 04/04/2012   Lactose Other (See Comments) 04/04/2012   Lactose intolerance (gi) Other (See Comments) 04/04/2012   Orange oil Diarrhea 01/22/2014   Penicillins Hives 03/21/2012   Other Other (See Comments) 06/17/2015   Tape Other (See Comments) and Rash 04/10/2012    Vitals: BP 130/70 (BP Location: Left Arm, Patient Position: Sitting)    Pulse 71    Temp (!) 97 F (36.1 C) Comment: taken at front   Ht '5\' 4"'  (1.626 m)    Wt 161 lb (73 kg)    BMI 27.64 kg/m  Last Weight:  Wt Readings from Last 1 Encounters:  05/06/19 161 lb (73 kg)   Last Height:   Ht Readings from Last 1 Encounters:  05/06/19 '5\' 4"'  (1.626 m)     Physical exam: Exam: Gen: NAD, conversant, well  nourised, well groomed                     CV: RRR, no MRG. No Carotid Bruits. No peripheral edema, warm, nontender Eyes: Conjunctivae clear without exudates or hemorrhage  Neuro: Detailed Neurologic Exam  Speech:    Speech is normal; fluent and spontaneous with normal comprehension.  Cognition:    The patient is oriented to person, place, and time;     recent and remote memory intact;     language fluent;     normal attention, concentration,     fund of knowledge Cranial Nerves:    The pupils are equal, round, and reactive to light. The fundi are normal and spontaneous venous pulsations are present. Visual fields are full to finger confrontation. Extraocular movements are intact. Trigeminal sensation is intact and the muscles of mastication are normal. The face is symmetric. The palate elevates in the midline. Hearing intact. Voice is normal. Shoulder shrug is normal. The tongue has normal motion without fasciculations.   Coordination:    Normal finger to nose and heel to shin. Normal  rapid alternating movements.   Gait:    Heel-toe and tandem gait are normal.   Motor Observation:    No asymmetry, no atrophy, and no involuntary movements noted. Tone:    Normal muscle tone.    Posture:    Posture is normal. normal erect    Strength: 4/5 bilateral hip flexion weakness. Otherwise strength is V/V in the upper and lower limbs.      Sensation: intact to LT     Reflex Exam:  DTR's:    Basent AJs otherwise Deep tendon reflexes in the upper and lower extremities are brisk for age bilaterally.   Toes:    The toes are downgoing bilaterally.   Clonus:    Clonus is absent.    Assessment/Plan:   69 y.o. female here as requested by [name], MD for parestesias and weakness in the upper and lower extremities. PMHx neuropathy diagnosed 16 years ago, multinodular goiter, lung cancer status post right upper lung radiation March 2006 stage II with dry segment ectomy with lymph node  dissection followed by 4 cycles of chemotherapy, osteoarthritis, degenerative disc disease, anxiety, breast cancer.  Really lovely patient, she has a constellation of symptoms that I really cannot attribute to one particular diagnosis however I do think that she needs thorough evaluation especially given her history of malignancies.  She reports paresthesias and neuropathy, she is already been extensively evaluated with lab work by Dr. Kenton Kingfisher but I will order several other labs including Lyme antibodies (she has had tick bites before), and myasthenia gravis labs due to weakness and few episodes of ptosis (however her history really does not fit myasthenia gravis but still need to check).  Her exam shows some mild proximal weakness, given her ptosis, paresthesias, and slightly brisk reflexes we really should check for upper motor neuron lesions with MRI of the brain and cervical spine; MRI of the brain with and without contrast especially to ensure no malignancies, MRI of the cervical spine to rule out any myelopathies that can cause symptoms in all the limbs.  We can also perform an EMG nerve conduction study.  Blood work MRI brain and cervical spine Emg/ncs: left arm and left leg worse  Orders Placed This Encounter  Procedures   MR BRAIN W WO CONTRAST   MR CERVICAL SPINE WO CONTRAST   B12 and Folate Panel   Methylmalonic acid, serum   Heavy metals, blood   Multiple Myeloma Panel (SPEP&IFE w/QIG)   B. burgdorfi Antibody   Vitamin B1   Acetylcholine receptor, binding   Acetylcholine receptor, blocking   Acetylcholine receptor, modulating   Basic Metabolic Panel   NCV with EMG(electromyography)   No orders of the defined types were placed in this encounter.   Cc: [name], MD,    Sarina Ill, MD  [facility] [address] [place], [place] [address]  Phone [phone] Fax [phone]

## 2019-05-13 LAB — B12 AND FOLATE PANEL
Folate: >20 ng/mL (ref >3.0)
Vitamin B-12: 496 pg/mL (ref 232–1245)

## 2019-05-13 LAB — MULTIPLE MYELOMA PANEL, SERUM
Albumin SerPl Elph-Mcnc: 3.4 g/dL (ref 2.9–4.4)
Albumin/Glob SerPl: 1.2 (ref 0.7–1.7)
Alpha 1: 0.3 g/dL (ref 0.0–0.4)
Alpha2 Glob SerPl Elph-Mcnc: 0.8 g/dL (ref 0.4–1.0)
B-Globulin SerPl Elph-Mcnc: 1.1 g/dL (ref 0.7–1.3)
Gamma Glob SerPl Elph-Mcnc: 0.7 g/dL (ref 0.4–1.8)
Globulin, Total: 3 g/dL (ref 2.2–3.9)
IgA/Immunoglobulin A, Serum: 224 mg/dL (ref 87–352)
IgG (Immunoglobin G), Serum: 912 mg/dL (ref 586–1602)
IgM (Immunoglobulin M), Srm: 46 mg/dL (ref 26–217)
Total Protein: 6.4 g/dL (ref 6.0–8.5)

## 2019-05-13 LAB — VITAMIN B1: Thiamine: 133.9 nmol/L (ref 66.5–200.0)

## 2019-05-13 LAB — BASIC METABOLIC PANEL
BUN/Creatinine Ratio: 20 (ref 12–28)
BUN: 15 mg/dL (ref 8–27)
CO2: 21 mmol/L (ref 20–29)
Calcium: 9.3 mg/dL (ref 8.7–10.3)
Chloride: 109 mmol/L — ABNORMAL HIGH (ref 96–106)
Creatinine, Ser: 0.76 mg/dL (ref 0.57–1.00)
GFR calc Af Amer: 94 mL/min/{1.73_m2} (ref >59)
GFR calc non Af Amer: 81 mL/min/{1.73_m2} (ref >59)
Glucose: 99 mg/dL (ref 65–99)
Potassium: 4.8 mmol/L (ref 3.5–5.2)
Sodium: 145 mmol/L — ABNORMAL HIGH (ref 134–144)

## 2019-05-13 LAB — ACETYLCHOLINE RECEPTOR, BINDING: AChR Binding Ab, Serum: <0.03 nmol/L (ref 0.00–0.24)

## 2019-05-13 LAB — HEAVY METALS, BLOOD
Arsenic: 6 ug/L (ref 2–23)
Lead, Blood: 1 ug/dL (ref 0–4)
Mercury: <1 ug/L (ref 0.0–14.9)

## 2019-05-13 LAB — METHYLMALONIC ACID, SERUM: Methylmalonic Acid: 116 nmol/L (ref 0–378)

## 2019-05-13 LAB — ACETYLCHOLINE RECEPTOR, MODULATING: Acetylcholine Modulat Ab: <12 % (ref 0–20)

## 2019-05-13 LAB — ACETYLCHOLINE RECEPTOR, BLOCKING: Acetylchol Block Ab: 12 % (ref 0–25)

## 2019-05-13 LAB — B. BURGDORFI ANTIBODIES: Lyme IgG/IgM Ab: <0.91 {ISR} (ref 0.00–0.90)

## 2019-05-13 NOTE — Progress Notes (Signed)
Labs look fine, thanks, stay safe tomorrow. Dr Jaynee Eagles

## 2019-06-11 DIAGNOSIS — M25562 Pain in left knee: Secondary | ICD-10-CM | POA: Diagnosis not present

## 2019-06-11 DIAGNOSIS — M5441 Lumbago with sciatica, right side: Secondary | ICD-10-CM | POA: Diagnosis not present

## 2019-06-11 DIAGNOSIS — M545 Low back pain: Secondary | ICD-10-CM | POA: Diagnosis not present

## 2019-06-12 ENCOUNTER — Other Ambulatory Visit: Payer: Self-pay | Admitting: Neurology

## 2019-06-12 MED ORDER — ALPRAZOLAM 0.25 MG PO TABS: ORAL_TABLET | ORAL | 0 refills | Status: DC

## 2019-06-16 ENCOUNTER — Ambulatory Visit
Admission: RE | Admit: 2019-06-16 | Discharge: 2019-06-16 | Disposition: A | Discharge status: Home / Self Care | Payer: Medicare Other | Source: Ambulatory Visit | Attending: Neurology | Admitting: Neurology

## 2019-06-16 ENCOUNTER — Other Ambulatory Visit: Payer: Self-pay

## 2019-06-16 DIAGNOSIS — M6281 Muscle weakness (generalized): Secondary | ICD-10-CM

## 2019-06-16 DIAGNOSIS — R292 Abnormal reflex: Secondary | ICD-10-CM

## 2019-06-16 DIAGNOSIS — H02403 Unspecified ptosis of bilateral eyelids: Secondary | ICD-10-CM

## 2019-06-16 DIAGNOSIS — R531 Weakness: Secondary | ICD-10-CM

## 2019-06-16 DIAGNOSIS — R202 Paresthesia of skin: Secondary | ICD-10-CM

## 2019-06-16 DIAGNOSIS — M6289 Other specified disorders of muscle: Secondary | ICD-10-CM

## 2019-06-16 DIAGNOSIS — H532 Diplopia: Secondary | ICD-10-CM

## 2019-06-16 DIAGNOSIS — R209 Unspecified disturbances of skin sensation: Secondary | ICD-10-CM

## 2019-06-16 MED ORDER — GADOBENATE DIMEGLUMINE 529 MG/ML IV SOLN
15.0000 mL | Freq: Once | INTRAVENOUS | Status: AC | PRN
  Administered 2019-06-16: 15 mL via INTRAVENOUS

## 2019-06-18 ENCOUNTER — Ambulatory Visit (INDEPENDENT_AMBULATORY_CARE_PROVIDER_SITE_OTHER): Payer: Medicare Other | Admitting: Neurology

## 2019-06-18 ENCOUNTER — Other Ambulatory Visit: Payer: Self-pay

## 2019-06-18 DIAGNOSIS — M6281 Muscle weakness (generalized): Secondary | ICD-10-CM

## 2019-06-18 DIAGNOSIS — R202 Paresthesia of skin: Secondary | ICD-10-CM

## 2019-06-18 DIAGNOSIS — M5417 Radiculopathy, lumbosacral region: Secondary | ICD-10-CM | POA: Diagnosis not present

## 2019-06-18 DIAGNOSIS — M4802 Spinal stenosis, cervical region: Secondary | ICD-10-CM | POA: Diagnosis not present

## 2019-06-18 DIAGNOSIS — R29898 Other symptoms and signs involving the musculoskeletal system: Secondary | ICD-10-CM

## 2019-06-18 DIAGNOSIS — R531 Weakness: Secondary | ICD-10-CM | POA: Diagnosis not present

## 2019-06-18 DIAGNOSIS — Z0289 Encounter for other administrative examinations: Secondary | ICD-10-CM

## 2019-06-18 DIAGNOSIS — M6289 Other specified disorders of muscle: Secondary | ICD-10-CM | POA: Diagnosis not present

## 2019-06-18 NOTE — Progress Notes (Signed)
Patient with paresthesias and weakness in the upper and lower extremities. Discussed findings with patient including normal emg/ncs. Also reviewed images of MRI cervical spine with moderate stenosis at one level and multilevel foraminal stenosis. We reviewed imaging together. I'm not certain that this is causing her symptoms and the moderate stenosis may be incidental but feel neurosurgery evaluation warranted due to the reported weakness, will also order MRI lumbar spine.   Personally reviewed images and agree with the following:  -At C5-6 uncovertebral joint hypertrophy and facet hypertrophy with moderate spinal stenosis and severe bilateral foraminal stenosis. -At C4-5 uncovertebral joint hypertrophy and facet hypertrophy with mild spinal stenosis and severe bilateral foraminal stenosis. -At C6-7 mild disc bulging with borderline mild spinal stenosis and no foraminal stenosis. -No cord signal abnormalities noted.  Orders Placed This Encounter  Procedures  . MR LUMBAR SPINE WO CONTRAST  . Ambulatory referral to Neurosurgery     I spent 25 minutes of face-to-face and non-face-to-face time with patient on the  1. Cervical stenosis of spinal canal   2. Paresthesias   3. Weakness   4. Muscle weakness of proximal extremity   5. Weakness of both lower extremities   6. Lumbosacral radiculopathy    diagnosis.  This included previsit chart review, lab review, study review, order entry, electronic health record documentation, patient education on the different diagnostic and therapeutic options, counseling and coordination of care, risks and benefits of management, compliance, or risk factor reduction. This does not include time spent on emg/ncs.

## 2019-06-18 NOTE — Progress Notes (Signed)
See procedure note.

## 2019-06-18 NOTE — Progress Notes (Signed)
Full Name: [name] Gender: Female MRN #: [id number] Date of Birth: [birth year]    Visit Date: 06/18/2019 07:43 Age: 69 Years Examining Physician: Sarina Ill, MD  Referring Physician: [name], MD  History: Paresthesias and weakness. MRI of the brain was unremarkable. MRI of the cervical spine with moderate spinal stenosis at c5-c6 and multilevel foraminal stenosis.   Summary: EMG/NCS performed on the left upper and lower extremities. All nerves and muscles (as indicated in the following tables) were within normal limits.       Conclusion: This is a normal study.  Sarina Ill M.D.  [facility] [address], [place], [place] [address] Tel: [phone] Fax: [phone]   CC: Dr. Kenton Kingfisher and Dr. Vertell Limber      [facility]    Nerve / Sites Muscle Latency Ref. Amplitude Ref. Rel Amp Segments Distance Velocity Ref. Area    ms ms mV mV %  cm m/s m/s mVms  L Median - APB     Wrist APB 3.9 ?4.4 8.4 ?4.0 100 Wrist - APB 7   34.7     Upper arm APB 7.2  8.2  97 Upper arm - Wrist 18 55 ?49 32.4  L Ulnar - ADM     Wrist ADM 2.6 ?3.3 9.5 ?6.0 100 Wrist - ADM 7   28.9     B.Elbow ADM 5.6  8.8  92.6 B.Elbow - Wrist 17 56 ?49 27.9     A.Elbow ADM 7.6  8.3  94.2 A.Elbow - B.Elbow 10 51 ?49 27.9  L Peroneal - EDB     Ankle EDB 4.3 ?6.5 5.9 ?2.0 100 Ankle - EDB 9   22.4     Fib head EDB 9.6  5.4  91.5 Fib head - Ankle 27 51 ?44 20.9     Pop fossa EDB 11.5  4.8  90.3 Pop fossa - Fib head 10 51 ?44 19.2         Pop fossa - Ankle      L Tibial - AH     Ankle AH 3.8 ?5.8 10.3 ?4.0 100 Ankle - AH 9   28.8     Pop fossa AH 10.8  8.1  79.3 Pop fossa - Ankle 33 47 ?41 28.4             SNC    Nerve / Sites Rec. Site Peak Lat Ref.  Amp Ref. Segments Distance Peak Diff Ref.    ms ms V V  cm ms ms  L Sural - Ankle (Calf)     Calf Ankle 3.9 ?4.4 6 ?6 Calf - Ankle 14    L Superficial peroneal - Ankle     Lat leg Ankle 4.2 ?4.4 6 ?6 Lat leg - Ankle 14    L  Median, Ulnar - Transcarpal comparison     Median Palm Wrist 2.4 ?2.2 52 ?35 Median Palm - Wrist 8       Ulnar Palm Wrist 2.0 ?2.2 21 ?12 Ulnar Palm - Wrist 8          Median Palm - Ulnar Palm  0.3 ?0.4  L Median - Orthodromic (Dig II, Mid palm)     Dig II Wrist 3.4 ?3.4 12 ?10 Dig II - Wrist 13    L Ulnar - Orthodromic, (Dig V, Mid palm)     Dig V Wrist 2.9 ?3.1 7 ?5 Dig V - Wrist 11  F  Wave    Nerve F Lat Ref.   ms ms  L Tibial - AH 49.9 ?56.0  L Ulnar - ADM 26.7 ?32.0         EMG Summary Table    Spontaneous MUAP Recruitment  Muscle IA Fib PSW Fasc Other Amp Dur. Poly Pattern  L. Cervical paraspinals (low) Normal None None None _______ Normal Normal Normal Normal  L. Deltoid Normal None None None _______ Normal Normal Normal Normal  L. Triceps brachii Normal None None None _______ Normal Normal Normal Normal  L. Pronator teres Normal None None None _______ Normal Normal Normal Normal  L. First dorsal interosseous Normal None None None _______ Normal Normal Normal Normal  L. Opponens pollicis Normal None None None _______ Normal Normal Normal Normal  L. Lumbar paraspinals (low) Normal None None None _______ Normal Normal Normal Normal  L. Iliopsoas Normal None None None _______ Normal Normal Normal Normal  L. Vastus medialis Normal None None None _______ Normal Normal Normal Normal  L. Tibialis anterior Normal None None None _______ Normal Normal Normal Normal  L. Gastrocnemius (Medial head) Normal None None None _______ Normal Normal Normal Normal  L. Extensor hallucis longus Normal None None None _______ Normal Normal Normal Normal

## 2019-06-22 NOTE — Procedures (Signed)
Full Name: [name] Gender: Female MRN #: [id number] Date of Birth: 1950/04/12    Visit Date: 06/18/2019 07:43 Age: 69 Years Examining Physician: Sarina Ill, MD  Referring Physician: [name], MD  History: Paresthesias and weakness. MRI of the brain was unremarkable. MRI of the cervical spine with moderate spinal stenosis at c5-c6 and multilevel foraminal stenosis.   Summary: EMG/NCS performed on the left upper and lower extremities. All nerves and muscles (as indicated in the following tables) were within normal limits.       Conclusion: This is a normal study.  Sarina Ill M.D.  [facility] [address] [place], [place] [address] Tel: [phone] Fax: [phone]   CC: Dr. Kenton Kingfisher and Dr. Vertell Limber      [facility]    Nerve / Sites Muscle Latency Ref. Amplitude Ref. Rel Amp Segments Distance Velocity Ref. Area    ms ms mV mV %  cm m/s m/s mVms  L Median - APB     Wrist APB 3.9 ?4.4 8.4 ?4.0 100 Wrist - APB 7   34.7     Upper arm APB 7.2  8.2  97 Upper arm - Wrist 18 55 ?49 32.4  L Ulnar - ADM     Wrist ADM 2.6 ?3.3 9.5 ?6.0 100 Wrist - ADM 7   28.9     B.Elbow ADM 5.6  8.8  92.6 B.Elbow - Wrist 17 56 ?49 27.9     A.Elbow ADM 7.6  8.3  94.2 A.Elbow - B.Elbow 10 51 ?49 27.9  L Peroneal - EDB     Ankle EDB 4.3 ?6.5 5.9 ?2.0 100 Ankle - EDB 9   22.4     Fib head EDB 9.6  5.4  91.5 Fib head - Ankle 27 51 ?44 20.9     Pop fossa EDB 11.5  4.8  90.3 Pop fossa - Fib head 10 51 ?44 19.2         Pop fossa - Ankle      L Tibial - AH     Ankle AH 3.8 ?5.8 10.3 ?4.0 100 Ankle - AH 9   28.8     Pop fossa AH 10.8  8.1  79.3 Pop fossa - Ankle 33 47 ?41 28.4             SNC    Nerve / Sites Rec. Site Peak Lat Ref.  Amp Ref. Segments Distance Peak Diff Ref.    ms ms V V  cm ms ms  L Sural - Ankle (Calf)     Calf Ankle 3.9 ?4.4 6 ?6 Calf - Ankle 14    L Superficial peroneal - Ankle     Lat leg Ankle 4.2 ?4.4 6 ?6 Lat leg - Ankle 14    L  Median, Ulnar - Transcarpal comparison     Median Palm Wrist 2.4 ?2.2 52 ?35 Median Palm - Wrist 8       Ulnar Palm Wrist 2.0 ?2.2 21 ?12 Ulnar Palm - Wrist 8          Median Palm - Ulnar Palm  0.3 ?0.4  L Median - Orthodromic (Dig II, Mid palm)     Dig II Wrist 3.4 ?3.4 12 ?10 Dig II - Wrist 13    L Ulnar - Orthodromic, (Dig V, Mid palm)     Dig V Wrist 2.9 ?3.1 7 ?5 Dig V - Wrist 11  F  Wave    Nerve F Lat Ref.   ms ms  L Tibial - AH 49.9 ?56.0  L Ulnar - ADM 26.7 ?32.0         EMG Summary Table    Spontaneous MUAP Recruitment  Muscle IA Fib PSW Fasc Other Amp Dur. Poly Pattern  L. Cervical paraspinals (low) Normal None None None _______ Normal Normal Normal Normal  L. Deltoid Normal None None None _______ Normal Normal Normal Normal  L. Triceps brachii Normal None None None _______ Normal Normal Normal Normal  L. Pronator teres Normal None None None _______ Normal Normal Normal Normal  L. First dorsal interosseous Normal None None None _______ Normal Normal Normal Normal  L. Opponens pollicis Normal None None None _______ Normal Normal Normal Normal  L. Lumbar paraspinals (low) Normal None None None _______ Normal Normal Normal Normal  L. Iliopsoas Normal None None None _______ Normal Normal Normal Normal  L. Vastus medialis Normal None None None _______ Normal Normal Normal Normal  L. Tibialis anterior Normal None None None _______ Normal Normal Normal Normal  L. Gastrocnemius (Medial head) Normal None None None _______ Normal Normal Normal Normal  L. Extensor hallucis longus Normal None None None _______ Normal Normal Normal Normal

## 2019-06-23 ENCOUNTER — Telehealth: Payer: Self-pay | Admitting: Neurology

## 2019-06-23 NOTE — Telephone Encounter (Signed)
Medicare/[employer] no auth order sent to GI. They will reach out to the patient to schedule.

## 2019-07-02 ENCOUNTER — Encounter: Payer: Medicare Other | Admitting: Neurology

## 2019-07-09 DIAGNOSIS — M25551 Pain in right hip: Secondary | ICD-10-CM | POA: Diagnosis not present

## 2019-07-09 DIAGNOSIS — M25561 Pain in right knee: Secondary | ICD-10-CM | POA: Diagnosis not present

## 2019-07-09 DIAGNOSIS — M545 Low back pain: Secondary | ICD-10-CM | POA: Diagnosis not present

## 2019-07-11 ENCOUNTER — Ambulatory Visit
Admission: RE | Admit: 2019-07-11 | Discharge: 2019-07-11 | Disposition: A | Discharge status: Home / Self Care | Payer: Medicare Other | Source: Ambulatory Visit | Attending: Neurology | Admitting: Neurology

## 2019-07-11 DIAGNOSIS — R29898 Other symptoms and signs involving the musculoskeletal system: Secondary | ICD-10-CM

## 2019-07-11 DIAGNOSIS — M6289 Other specified disorders of muscle: Secondary | ICD-10-CM

## 2019-07-11 DIAGNOSIS — R202 Paresthesia of skin: Secondary | ICD-10-CM | POA: Diagnosis not present

## 2019-07-11 DIAGNOSIS — M5417 Radiculopathy, lumbosacral region: Secondary | ICD-10-CM | POA: Diagnosis not present

## 2019-07-11 DIAGNOSIS — R200 Anesthesia of skin: Secondary | ICD-10-CM | POA: Diagnosis not present

## 2019-07-11 DIAGNOSIS — R531 Weakness: Secondary | ICD-10-CM

## 2019-07-11 DIAGNOSIS — M6281 Muscle weakness (generalized): Secondary | ICD-10-CM

## 2019-07-14 ENCOUNTER — Other Ambulatory Visit: Payer: Self-pay | Admitting: Internal Medicine

## 2019-07-14 DIAGNOSIS — D0511 Intraductal carcinoma in situ of right breast: Secondary | ICD-10-CM

## 2019-07-15 ENCOUNTER — Ambulatory Visit (HOSPITAL_COMMUNITY)
Admission: RE | Admit: 2019-07-15 | Discharge: 2019-07-15 | Disposition: A | Discharge status: Home / Self Care | Payer: Medicare Other | Source: Ambulatory Visit | Attending: Internal Medicine | Admitting: Internal Medicine

## 2019-07-15 ENCOUNTER — Inpatient Hospital Stay: Payer: Medicare Other | Attending: Internal Medicine

## 2019-07-15 ENCOUNTER — Other Ambulatory Visit: Payer: Self-pay

## 2019-07-15 DIAGNOSIS — Z9221 Personal history of antineoplastic chemotherapy: Secondary | ICD-10-CM | POA: Diagnosis not present

## 2019-07-15 DIAGNOSIS — Z79899 Other long term (current) drug therapy: Secondary | ICD-10-CM | POA: Insufficient documentation

## 2019-07-15 DIAGNOSIS — C50111 Malignant neoplasm of central portion of right female breast: Secondary | ICD-10-CM

## 2019-07-15 DIAGNOSIS — R978 Other abnormal tumor markers: Secondary | ICD-10-CM | POA: Diagnosis not present

## 2019-07-15 DIAGNOSIS — N281 Cyst of kidney, acquired: Secondary | ICD-10-CM | POA: Diagnosis not present

## 2019-07-15 DIAGNOSIS — D0511 Intraductal carcinoma in situ of right breast: Secondary | ICD-10-CM | POA: Insufficient documentation

## 2019-07-15 DIAGNOSIS — Z9011 Acquired absence of right breast and nipple: Secondary | ICD-10-CM | POA: Insufficient documentation

## 2019-07-15 DIAGNOSIS — Z79810 Long term (current) use of selective estrogen receptor modulators (SERMs): Secondary | ICD-10-CM | POA: Diagnosis not present

## 2019-07-15 DIAGNOSIS — Z803 Family history of malignant neoplasm of breast: Secondary | ICD-10-CM | POA: Insufficient documentation

## 2019-07-15 DIAGNOSIS — Z85118 Personal history of other malignant neoplasm of bronchus and lung: Secondary | ICD-10-CM | POA: Insufficient documentation

## 2019-07-15 DIAGNOSIS — Z923 Personal history of irradiation: Secondary | ICD-10-CM | POA: Diagnosis not present

## 2019-07-15 DIAGNOSIS — F419 Anxiety disorder, unspecified: Secondary | ICD-10-CM | POA: Insufficient documentation

## 2019-07-15 DIAGNOSIS — M1990 Unspecified osteoarthritis, unspecified site: Secondary | ICD-10-CM | POA: Diagnosis not present

## 2019-07-15 DIAGNOSIS — C3412 Malignant neoplasm of upper lobe, left bronchus or lung: Secondary | ICD-10-CM | POA: Insufficient documentation

## 2019-07-15 DIAGNOSIS — Z170 Estrogen receptor positive status [ER+]: Secondary | ICD-10-CM | POA: Diagnosis not present

## 2019-07-15 DIAGNOSIS — M5030 Other cervical disc degeneration, unspecified cervical region: Secondary | ICD-10-CM | POA: Insufficient documentation

## 2019-07-15 DIAGNOSIS — K219 Gastro-esophageal reflux disease without esophagitis: Secondary | ICD-10-CM | POA: Insufficient documentation

## 2019-07-15 DIAGNOSIS — Z800 Family history of malignant neoplasm of digestive organs: Secondary | ICD-10-CM | POA: Diagnosis not present

## 2019-07-15 DIAGNOSIS — Z01818 Encounter for other preprocedural examination: Secondary | ICD-10-CM | POA: Diagnosis not present

## 2019-07-15 LAB — CMP (CANCER CENTER ONLY)
ALT: 13 U/L (ref 0–44)
AST: 15 U/L (ref 15–41)
Albumin: 3.3 g/dL — ABNORMAL LOW (ref 3.5–5.0)
Alkaline Phosphatase: 65 U/L (ref 38–126)
Anion gap: 11 (ref 5–15)
BUN: 25 mg/dL — ABNORMAL HIGH (ref 8–23)
CO2: 23 mmol/L (ref 22–32)
Calcium: 8.9 mg/dL (ref 8.9–10.3)
Chloride: 107 mmol/L (ref 98–111)
Creatinine: 0.87 mg/dL (ref 0.44–1.00)
GFR, Est AFR Am: >60 mL/min (ref >60)
GFR, Estimated: >60 mL/min (ref >60)
Glucose, Bld: 104 mg/dL — ABNORMAL HIGH (ref 70–99)
Potassium: 4.3 mmol/L (ref 3.5–5.1)
Sodium: 141 mmol/L (ref 135–145)
Total Bilirubin: 0.4 mg/dL (ref 0.3–1.2)
Total Protein: 6.8 g/dL (ref 6.5–8.1)

## 2019-07-15 LAB — CBC WITH DIFFERENTIAL (CANCER CENTER ONLY)
Abs Immature Granulocytes: 0.01 10*3/uL (ref 0.00–0.07)
Basophils Absolute: 0.1 10*3/uL (ref 0.0–0.1)
Basophils Relative: 1 %
Eosinophils Absolute: 0.3 10*3/uL (ref 0.0–0.5)
Eosinophils Relative: 5 %
HCT: 40.4 % (ref 36.0–46.0)
Hemoglobin: 13.1 g/dL (ref 12.0–15.0)
Immature Granulocytes: 0 %
Lymphocytes Relative: 32 %
Lymphs Abs: 2 10*3/uL (ref 0.7–4.0)
MCH: 29.8 pg (ref 26.0–34.0)
MCHC: 32.4 g/dL (ref 30.0–36.0)
MCV: 92 fL (ref 80.0–100.0)
Monocytes Absolute: 0.6 10*3/uL (ref 0.1–1.0)
Monocytes Relative: 9 %
Neutro Abs: 3.4 10*3/uL (ref 1.7–7.7)
Neutrophils Relative %: 53 %
Platelet Count: 289 10*3/uL (ref 150–400)
RBC: 4.39 MIL/uL (ref 3.87–5.11)
RDW: 13.8 % (ref 11.5–15.5)
WBC Count: 6.5 10*3/uL (ref 4.0–10.5)
nRBC: 0 % (ref 0.0–0.2)

## 2019-07-16 LAB — CANCER ANTIGEN 27.29: CA 27.29: 32.4 U/mL (ref 0.0–38.6)

## 2019-07-20 ENCOUNTER — Ambulatory Visit: Payer: Medicare Other | Admitting: Internal Medicine

## 2019-07-21 ENCOUNTER — Telehealth: Payer: Self-pay | Admitting: Internal Medicine

## 2019-07-21 ENCOUNTER — Encounter: Payer: Self-pay | Admitting: Internal Medicine

## 2019-07-21 ENCOUNTER — Inpatient Hospital Stay (HOSPITAL_BASED_OUTPATIENT_CLINIC_OR_DEPARTMENT_OTHER): Payer: Medicare Other | Admitting: Internal Medicine

## 2019-07-21 ENCOUNTER — Other Ambulatory Visit: Payer: Self-pay

## 2019-07-21 VITALS — BP 149/84 | HR 83 | Temp 98.7°F | Resp 18 | Wt 159.0 lb

## 2019-07-21 DIAGNOSIS — D0511 Intraductal carcinoma in situ of right breast: Secondary | ICD-10-CM

## 2019-07-21 DIAGNOSIS — Z170 Estrogen receptor positive status [ER+]: Secondary | ICD-10-CM

## 2019-07-21 DIAGNOSIS — M5030 Other cervical disc degeneration, unspecified cervical region: Secondary | ICD-10-CM | POA: Diagnosis not present

## 2019-07-21 DIAGNOSIS — C50111 Malignant neoplasm of central portion of right female breast: Secondary | ICD-10-CM | POA: Diagnosis not present

## 2019-07-21 DIAGNOSIS — Z803 Family history of malignant neoplasm of breast: Secondary | ICD-10-CM | POA: Diagnosis not present

## 2019-07-21 DIAGNOSIS — Z79810 Long term (current) use of selective estrogen receptor modulators (SERMs): Secondary | ICD-10-CM | POA: Diagnosis not present

## 2019-07-21 DIAGNOSIS — R978 Other abnormal tumor markers: Secondary | ICD-10-CM | POA: Diagnosis not present

## 2019-07-21 DIAGNOSIS — N281 Cyst of kidney, acquired: Secondary | ICD-10-CM | POA: Diagnosis not present

## 2019-07-21 NOTE — Progress Notes (Signed)
[place] [phone]   [phone]  OFFICE PROGRESS NOTE  [name], MD [place] [id number]  DIAGNOSIS:  1) stage IIA non-small cell lung cancer diagnosed in March of 2006 2) recurrent ductal carcinoma in situ of the right breast initially diagnosed in January of 2014 with recurrence in October of 2014.  PRIOR THERAPY: 1) status post left trisegmentectomy with node dissection on 06/24/2004 2) status post 4 cycles of adjuvant chemotherapy with carboplatin and paclitaxel last dose was given 10/24/2004. 3) status post right partial mastectomy on 04/10/2012 under the care of Dr. Brantley Stage. 4) status post adjuvant radiotherapy to the right breast under the care of Dr. Valere Dross  CURRENT THERAPY: Tamoxifen 20 mg by mouth daily started in May of 2014.  INTERVAL HISTORY: [name] 69 y.o. female returns to the clinic today for 6 months follow-up visit.  The patient is feeling fine today with no concerning complaints.  She has some degenerative disc disease in the cervical area and is scheduled to see Dr. Vertell Limber from neurosurgery soon for surgical intervention.  The patient denied having any current chest pain, shortness of breath, cough or hemoptysis.  She denied having any fever or chills.  She has no nausea, vomiting, diarrhea or constipation.  She has no headache or visual changes.  She is here today for evaluation with repeat blood work and chest x-ray.  MEDICAL HISTORY: Past Medical History:  Diagnosis Date  . Anxiety   . Breast cancer ([place])    2014 and 2015  . Cancer ([place])    lung cancer   . DDD (degenerative disc disease)    neck  . Dry eye   . Eczema   . Family history of breast cancer   . Family history of stomach cancer   . GERD (gastroesophageal reflux disease)    occasional - no current med.  Marland Kitchen History of breast cancer   . History of lung cancer    non-small cell - left upper lobe  . IBS  (irritable bowel syndrome)   . Non-allergic rhinitis    sinusitis  . Osteoarthritis   . Personal history of radiation therapy 2014  . PONV (postoperative nausea and vomiting)   . Radiation 07/03/12-07/09/12   Right upper lung 5400 cGy  . Sinus headache    occasional  . Thyroid nodule, toxic or with hyperthyroidism    no current med.  . Urinary, incontinence, stress female   . Vasomotor rhinitis   . Vertigo     ALLERGIES:  is allergic to amoxicillin; asa [aspirin]; banana; ceftin [cefuroxime axetil]; citrus; lactose; lactose intolerance (gi); orange oil; penicillins; other; and tape.  MEDICATIONS:  Current Outpatient Medications  Medication Sig Dispense Refill  . acetaminophen (TYLENOL) 500 MG tablet Take 1,000 mg by mouth at bedtime as needed (arthritis).     . ALPRAZolam (XANAX) 0.25 MG tablet Take 1-2 tabs (0.25mg -0.50mg ) 30-60 minutes before procedure. May repeat if needed.Do not drive. 4 tablet 0  . cyclobenzaprine (FLEXERIL) 10 MG tablet Take 1 tablet by mouth 3 (three) times daily as needed.  0  . cycloSPORINE (RESTASIS) 0.05 % ophthalmic emulsion 1 drop 2 (two) times daily.    Marland Kitchen esomeprazole (NEXIUM) 20 MG capsule Take 20 mg by mouth daily at 12 noon.    . fluocinonide gel (LIDEX) 0.05 %     . fluticasone (FLONASE) 50 MCG/ACT nasal spray Place 1 spray into both nostrils daily.    Marland Kitchen  ibuprofen (ADVIL,MOTRIN) 600 MG tablet Take 600 mg by mouth 2 (two) times daily as needed.   0  . Lactobacillus (ACIDOPHILUS PO) Take 5 mg by mouth daily.    . Lactobacillus (ACIDOPHILUS) 0.5 MG TABS Take by mouth.    . Multiple Vitamin (MULTIVITAMIN) capsule Take 1 capsule by mouth.    . nabumetone (RELAFEN) 500 MG tablet Take 500 mg by mouth 2 (two) times daily as needed (arthritis).    [name] Glycol-Propyl Glycol (SYSTANE OP) Apply 1 drop to eye as needed (dry eyes). Reported on 04/28/2015    . Propylene Glycol (SYSTANE BALANCE OP) Apply to eye as needed.    . tamoxifen (NOLVADEX) 20 MG  tablet TAKE 1 TABLET BY MOUTH EVERY DAY 30 tablet 5  . vitamin E 400 UNIT capsule Take 400 Units by mouth.     No current facility-administered medications for this visit.    SURGICAL HISTORY:  Past Surgical History:  Procedure Laterality Date  . APPENDECTOMY  1971  . BREAST LUMPECTOMY Right 2014  . BREAST RECONSTRUCTION WITH PLACEMENT OF TISSUE EXPANDER AND FLEX HD (ACELLULAR HYDRATED DERMIS) Right 05/20/2013   Procedure: IMMEDIATE RIGHT BREAST RECONSTRUCTION WITH LATISSAMUS MUSCLE FLAP AND PLACEMENT OF TISSUE EXPANDER TO RIGHT BREAST;  Surgeon: Theodoro Kos, DO;  Location: [facility];  Service: Plastics;  Laterality: Right;  . BREAST REDUCTION WITH MASTOPEXY Left 10/22/2013   Procedure: BREAST REDUCTION/MASTOPEXY TO LEFT BREAST WITH LIPOSUCTION/FAT GRAFTING FOR SYMMETRY;  Surgeon: Theodoro Kos, DO;  Location: [facility];  Service: Plastics;  Laterality: Left;  . DE QUERVAIN'S RELEASE Left 07/31/2002  . ganglion cyst R hand     1980s  . KNEE SURGERY Left    torn meniscus  . LUNG REMOVAL, PARTIAL     2014  . MASTECTOMY Right    2015  . MASTECTOMY W/ SENTINEL NODE BIOPSY Right 05/20/2013   Procedure: RIGHT SIMPLEMASTECTOMY AND SENTINEL NODE   Sampling;  Surgeon: Joyice Wagner. Cornett, MD;  Location: [facility];  Service: General;  Laterality: Right;  . MOUTH SURGERY Right may 2014   "keratocystic odotogenic tumor" - X 2  . NASAL SINUS SURGERY  ?1988-1990   "at least 3" (05/21/2013)  . PARTIAL MASTECTOMY WITH NEEDLE LOCALIZATION  04/10/2012   Procedure: PARTIAL MASTECTOMY WITH NEEDLE LOCALIZATION;  Surgeon: Marcello Moores A. Cornett, MD;  Location: [facility];  Service: General;  Laterality: Right;  . REDUCTION MAMMAPLASTY Left 2015  . REMOVAL OF TISSUE EXPANDER AND PLACEMENT OF IMPLANT Right 10/22/2013   Procedure: REMOVAL OF RIGHT TISSUE EXPANDER WITH PLACEMENT OF RIGHT BREAST IMPLANT;  Surgeon: Theodoro Kos, DO;  Location: [facility];  Service: Plastics;   Laterality: Right;  . SEPTOPLASTY     & sinus surgeries 1980s-1990s  . THORACOTOMY Left 07/14/2004  . TRIGGER FINGER RELEASE Bilateral    "I've had OR on q finger" (05/21/2013)  . TRIGGER FINGER RELEASE Right 07/14/1999   index and little fingers  . TRIGGER FINGER RELEASE Right 07/17/2002   long and ring fingers  . TRIGGER FINGER RELEASE Left 07/31/2002   little, ring and index fingers  . VIDEO ASSISTED THORACOSCOPY (VATS)/ LOBECTOMY Left 07/14/2004   upper tri-segmentectomy  . WISDOM TOOTH EXTRACTION     "3"    REVIEW OF SYSTEMS:  A comprehensive review of systems was negative except for: Musculoskeletal: positive for neck pain   PHYSICAL EXAMINATION: General appearance: alert, cooperative and no distress Head: Normocephalic, without obvious abnormality, atraumatic Neck: no adenopathy Lymph nodes:  Cervical, supraclavicular, and axillary nodes normal. Resp: clear to auscultation bilaterally Back: symmetric, no curvature. ROM normal. No CVA tenderness. Cardio: regular rate and rhythm, S1, S2 normal, no murmur, click, rub or gallop GI: soft, non-tender; bowel sounds normal; no masses,  no organomegaly Extremities: extremities normal, atraumatic, no cyanosis or edema  ECOG PERFORMANCE STATUS: 0 - Asymptomatic  Blood pressure (!) 149/84, pulse 83, temperature 98.7 F (37.1 C), temperature source Temporal, resp. rate 18, weight 159 lb 0.5 oz (72.1 kg), SpO2 100 %.  LABORATORY DATA: Lab Results  Component Value Date   WBC 6.5 07/15/2019   HGB 13.1 07/15/2019   HCT 40.4 07/15/2019   MCV 92.0 07/15/2019   PLT 289 07/15/2019      Chemistry      Component Value Date/Time   NA 141 07/15/2019 0847   NA 145 (H) 05/06/2019 1021   NA 141 10/29/2016 0742   K 4.3 07/15/2019 0847   K 4.1 10/29/2016 0742   CL 107 07/15/2019 0847   CL 108 (H) 04/28/2012 1008   CO2 23 07/15/2019 0847   CO2 26 10/29/2016 0742   BUN 25 (H) 07/15/2019 0847   BUN 15 05/06/2019 1021   BUN 21.1 10/29/2016  0742   CREATININE 0.87 07/15/2019 0847   CREATININE 0.9 10/29/2016 0742      Component Value Date/Time   CALCIUM 8.9 07/15/2019 0847   CALCIUM 9.0 10/29/2016 0742   ALKPHOS 65 07/15/2019 0847   ALKPHOS 49 10/29/2016 0742   AST 15 07/15/2019 0847   AST 15 10/29/2016 0742   ALT 13 07/15/2019 0847   ALT 12 10/29/2016 0742   BILITOT 0.4 07/15/2019 0847   BILITOT 0.32 10/29/2016 0742       RADIOGRAPHIC STUDIES: DG Chest 1 View  Result Date: 07/15/2019 CLINICAL DATA:  History of lung cancer with left upper lobectomy. History of breast cancer. Preoperative exam. EXAM: CHEST  1 VIEW COMPARISON:  06/10/2018, 04/20/2014 FINDINGS: The heart size and mediastinal contours are within normal limits. Atherosclerotic calcification of the aortic knob. Unchanged focal area of scarring within the lateral aspect of the right lung apex. No new focal airspace consolidation, pleural effusion, or pneumothorax. The visualized skeletal structures are unremarkable. IMPRESSION: No active disease. Electronically Signed   By: Davina Poke D.O.   On: 07/15/2019 13:17   MR LUMBAR SPINE WO CONTRAST  Result Date: 07/12/2019  [facility] [address], [place], [place] [address] [phone] NEUROIMAGING REPORT STUDY DATE: 07/11/2019 PATIENT NAME: Carmen MCCLARTY DOB: 1950/05/22 MRN: [id number] EXAM: MRI of the lumbar spine without contrast ORDERING CLINICIAN: Sarina Ill, MD CLINICAL HISTORY: 69 year old woman with leg pain, numbness and weakness COMPARISON FILMS: None TECHNIQUE: MRI of the lumbar spine was obtained utilizing 4 mm sagittal slices from H85-27 down to the lower sacrum with T1, T2 and inversion recovery views. In addition 4 mm axial slices from P8-2 down to L5-S1 level were included with T1 and T2 weighted views. CONTRAST: None IMAGING SITE: [facility], [address] [place], [place], [place] FINDINGS: On sagittal images, the spine is imaged from T11 to the sacrum.   An 11 mm  renal cyst is noted in the right kidney. The conus medullaris and cauda equine appear normal.   There is 2 mm anterolisthesis of L4 upon L5 with a degenerative nature. Schmorl's nodes are noted within some of the endplates associated with mild disc degradation. There is reduced disc height at L3-L4 and L4-L5. Mild endplate edema is noted at  L3-L4. The discs and interspaces were further evaluated on axial views from T12 to S1 as follows: T12-L1: The disc and interspace appear normal. Schmorl's nodes are noted in the adjacent endplates. There is no nerve root compression or spinal stenosis. L1-L2: There is mild disc bulging causing mild right foraminal narrowing but no nerve root compression or spinal stenosis. L2-L3: There is mild disc bulging but no foraminal narrowing, nerve root compression or spinal stenosis. L3-L4: There is broad disc protrusion causing moderate left and mild right foraminal narrowing and mild to moderate left and mild right lateral recess stenosis. The central canal is narrowed but not enough to be considered spinal stenosis. There did not appear to be nerve root compression. L4-L5: There is 2 mm anterolisthesis of L4 upon L5 associated with facet hypertrophy and left greater than right facet hypertrophy. There is protrusion of the uncovered disc. There is moderately severe bilateral lateral recess stenosis and mild bilateral foraminal narrowing. There is potential for L5 nerve root compression at the lateral recesses on either side. L5-S1: The disc and interspace appear normal.   This MRI of the lumbar spine without contrast shows the following: 1.   At L4-L5, there is minimal anterolisthesis, facet hypertrophy, ligamenta flava hypertrophy and disc protrusion causing moderately severe bilateral lateral recess stenosis with potential for L5 nerve root compression to either side. 2.   At L3-L4, there are degenerative changes causing mild to moderate left foraminal and lateral recess stenosis  but no nerve root compression. 3.   There are milder degenerative changes at the other lumbar levels as detailed above. INTERPRETING PHYSICIAN: Richard A. Felecia Shelling, MD, PhD, FAAN Certified in  Neuroimaging by [employer]   ASSESSMENT AND PLAN:  This is a very pleasant 69 years old white female with stage IIA non-small cell lung cancer as well as recurrent breast ductal carcinoma in situ. The patient is status post right lumpectomy followed by adjuvant radiotherapy. She is currently on treatment with tamoxifen and tolerating it fairly well. The patient has no concerning complaints today. Her blood work and chest x-ray are unremarkable. I recommended for her to continue her current treatment with Femara. I will see her back for follow-up visit in 1 year for evaluation and repeat blood work. The patient was advised to call immediately if she has any concerning symptoms in the interval. All questions were answered. The patient knows to call the clinic with any problems, questions or concerns. We can certainly see the patient much sooner if necessary.   Disclaimer: This note was dictated with voice recognition software. Similar sounding words can inadvertently be transcribed and may be missed upon review.

## 2019-07-21 NOTE — Telephone Encounter (Signed)
Scheduled per los. Called and left msg. Patient says no need to mail printout, she checks her mychart.

## 2019-08-05 DIAGNOSIS — M50222 Other cervical disc displacement at C5-C6 level: Secondary | ICD-10-CM | POA: Diagnosis not present

## 2019-08-05 DIAGNOSIS — R030 Elevated blood-pressure reading, without diagnosis of hypertension: Secondary | ICD-10-CM | POA: Diagnosis not present

## 2019-08-05 DIAGNOSIS — M5412 Radiculopathy, cervical region: Secondary | ICD-10-CM | POA: Diagnosis not present

## 2019-08-05 DIAGNOSIS — Z6827 Body mass index (BMI) 27.0-27.9, adult: Secondary | ICD-10-CM | POA: Diagnosis not present

## 2019-08-05 DIAGNOSIS — M4802 Spinal stenosis, cervical region: Secondary | ICD-10-CM | POA: Diagnosis not present

## 2019-08-06 DIAGNOSIS — K219 Gastro-esophageal reflux disease without esophagitis: Secondary | ICD-10-CM | POA: Diagnosis not present

## 2019-08-06 DIAGNOSIS — E042 Nontoxic multinodular goiter: Secondary | ICD-10-CM | POA: Diagnosis not present

## 2019-09-03 DIAGNOSIS — D164 Benign neoplasm of bones of skull and face: Secondary | ICD-10-CM | POA: Diagnosis not present

## 2019-09-15 ENCOUNTER — Other Ambulatory Visit: Payer: Self-pay | Admitting: Neurosurgery

## 2019-10-27 ENCOUNTER — Other Ambulatory Visit: Payer: Self-pay | Admitting: Family Medicine

## 2019-10-27 DIAGNOSIS — Z1231 Encounter for screening mammogram for malignant neoplasm of breast: Secondary | ICD-10-CM

## 2019-11-18 ENCOUNTER — Other Ambulatory Visit: Payer: Self-pay

## 2019-11-18 ENCOUNTER — Ambulatory Visit
Admission: RE | Admit: 2019-11-18 | Discharge: 2019-11-18 | Disposition: A | Discharge status: Home / Self Care | Payer: Medicare Other | Source: Ambulatory Visit | Attending: Family Medicine | Admitting: Family Medicine

## 2019-11-18 DIAGNOSIS — Z1231 Encounter for screening mammogram for malignant neoplasm of breast: Secondary | ICD-10-CM

## 2019-11-18 IMAGING — MG DIGITAL SCREENING UNILAT LEFT W/ TOMO W/ CAD
4 series · 4 of 12 positions shown · non-contrast
Comparison: Previous exams.

CLINICAL DATA: Screening.

EXAM:
DIGITAL SCREENING UNILATERAL LEFT MAMMOGRAM WITH CAD AND TOMO

[L CC synth-2D]
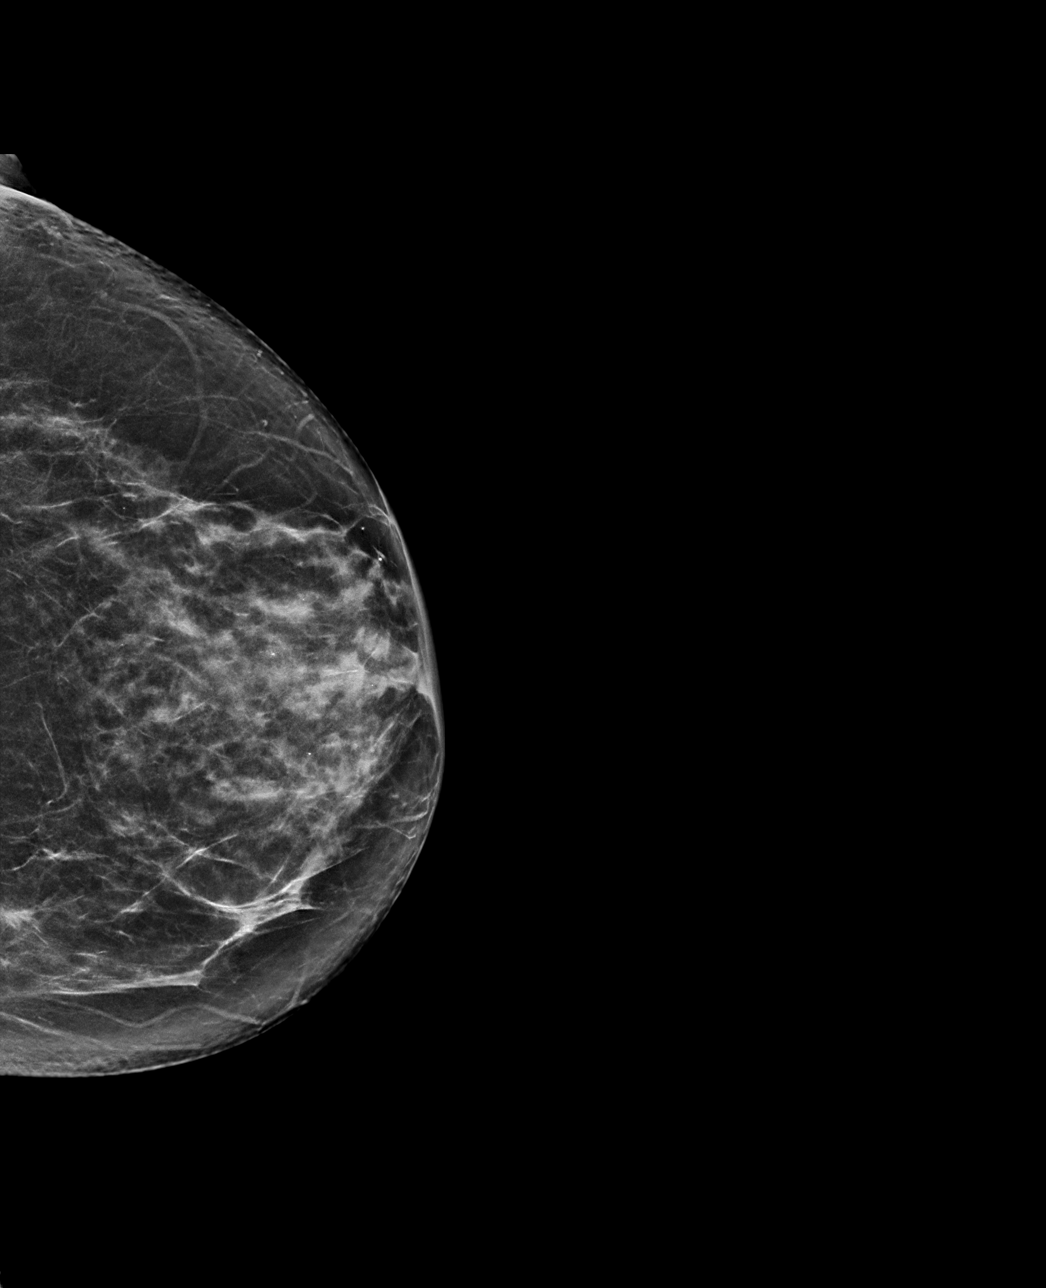

[L MLO synth-2D]
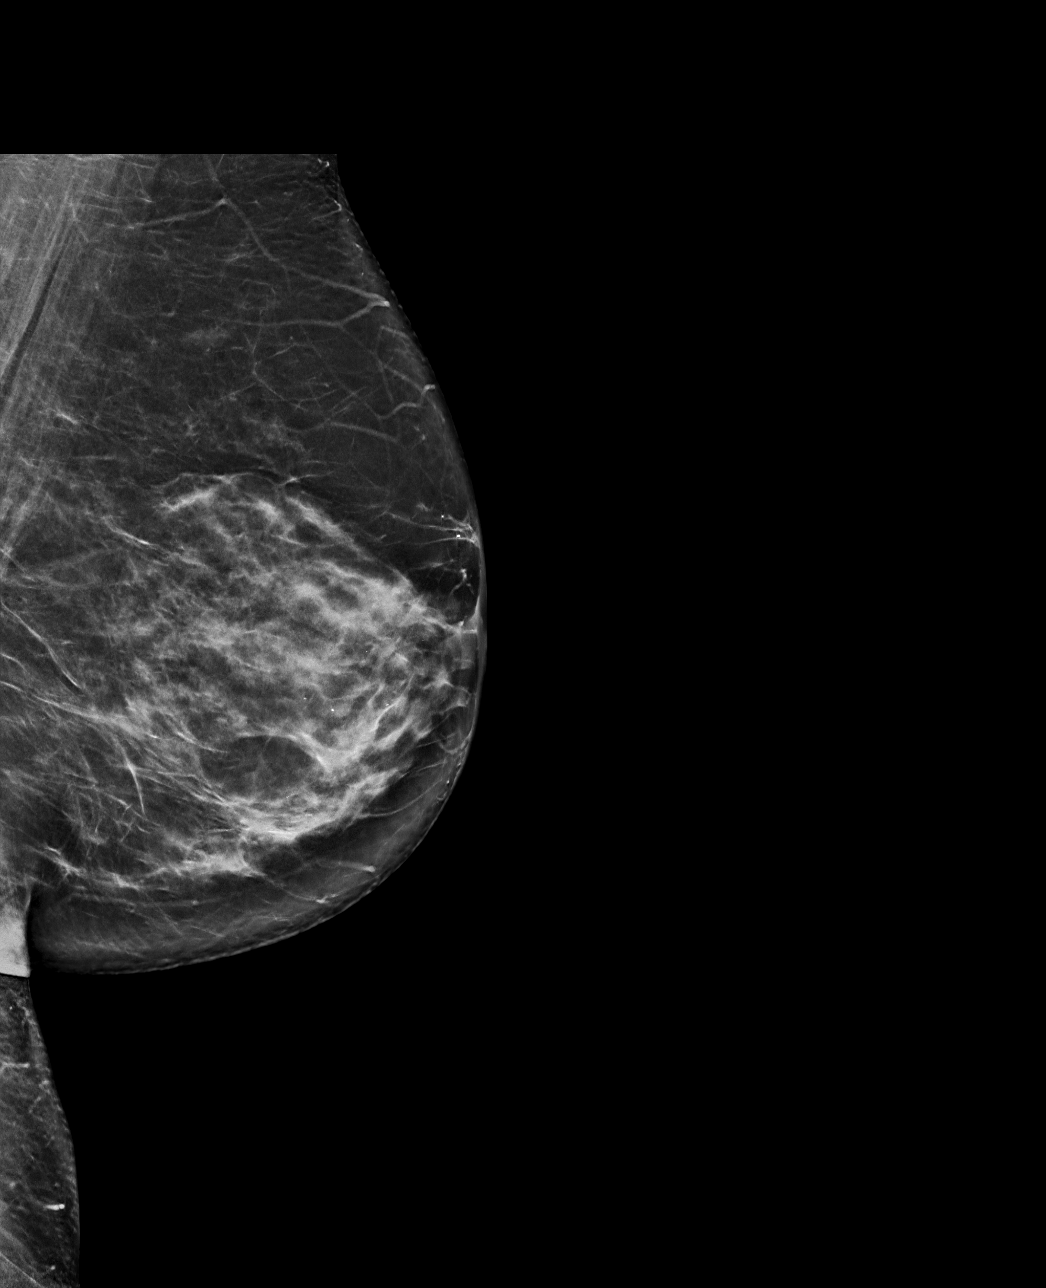

[L CC tomo · tomo slice 37/73.0]
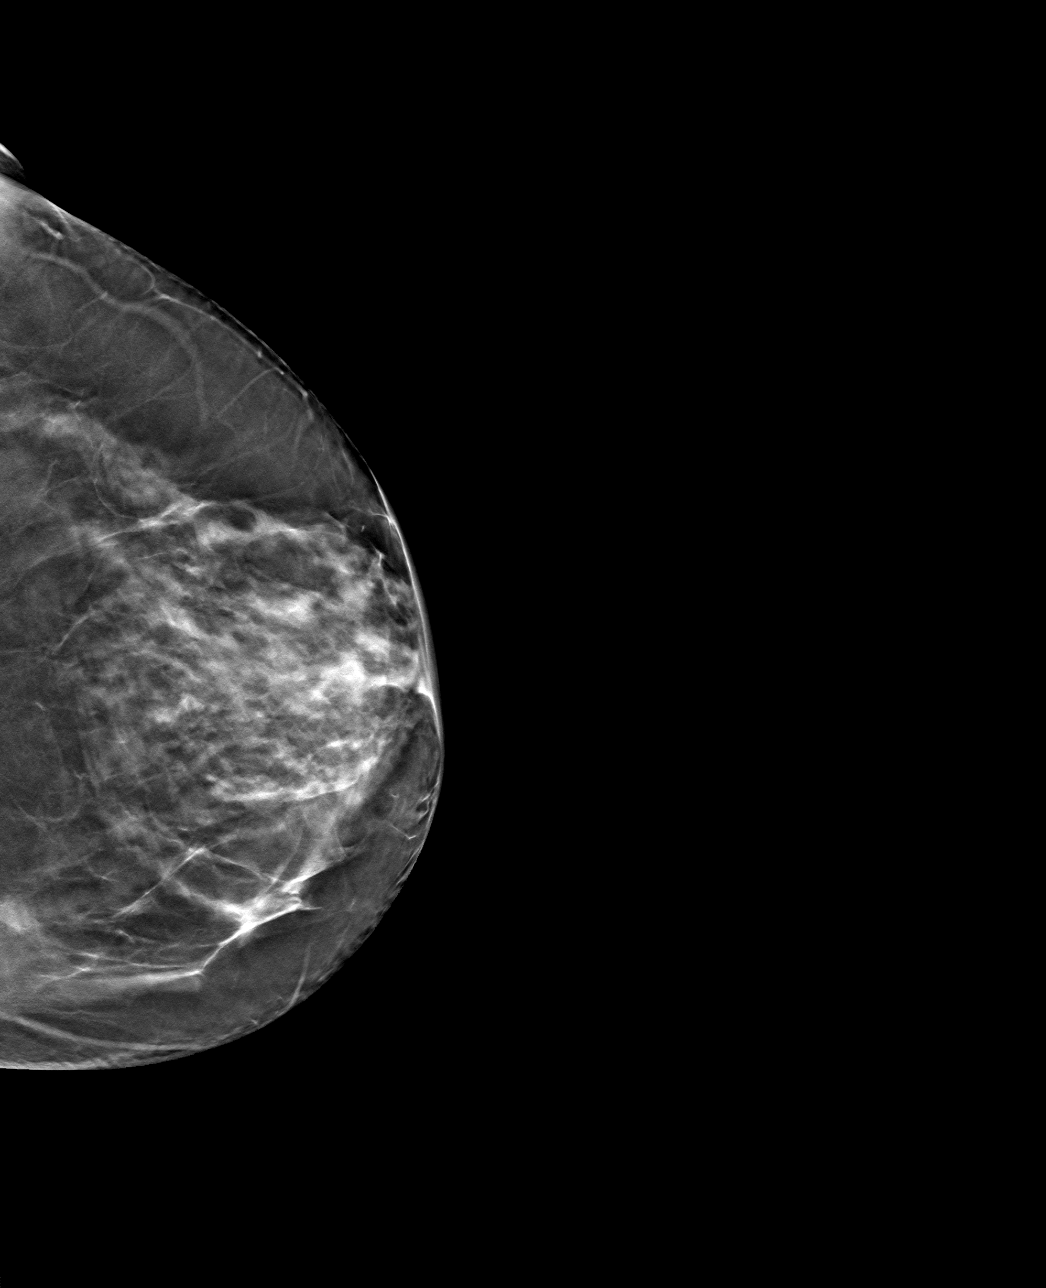

[L MLO tomo · tomo slice 39/77.0]
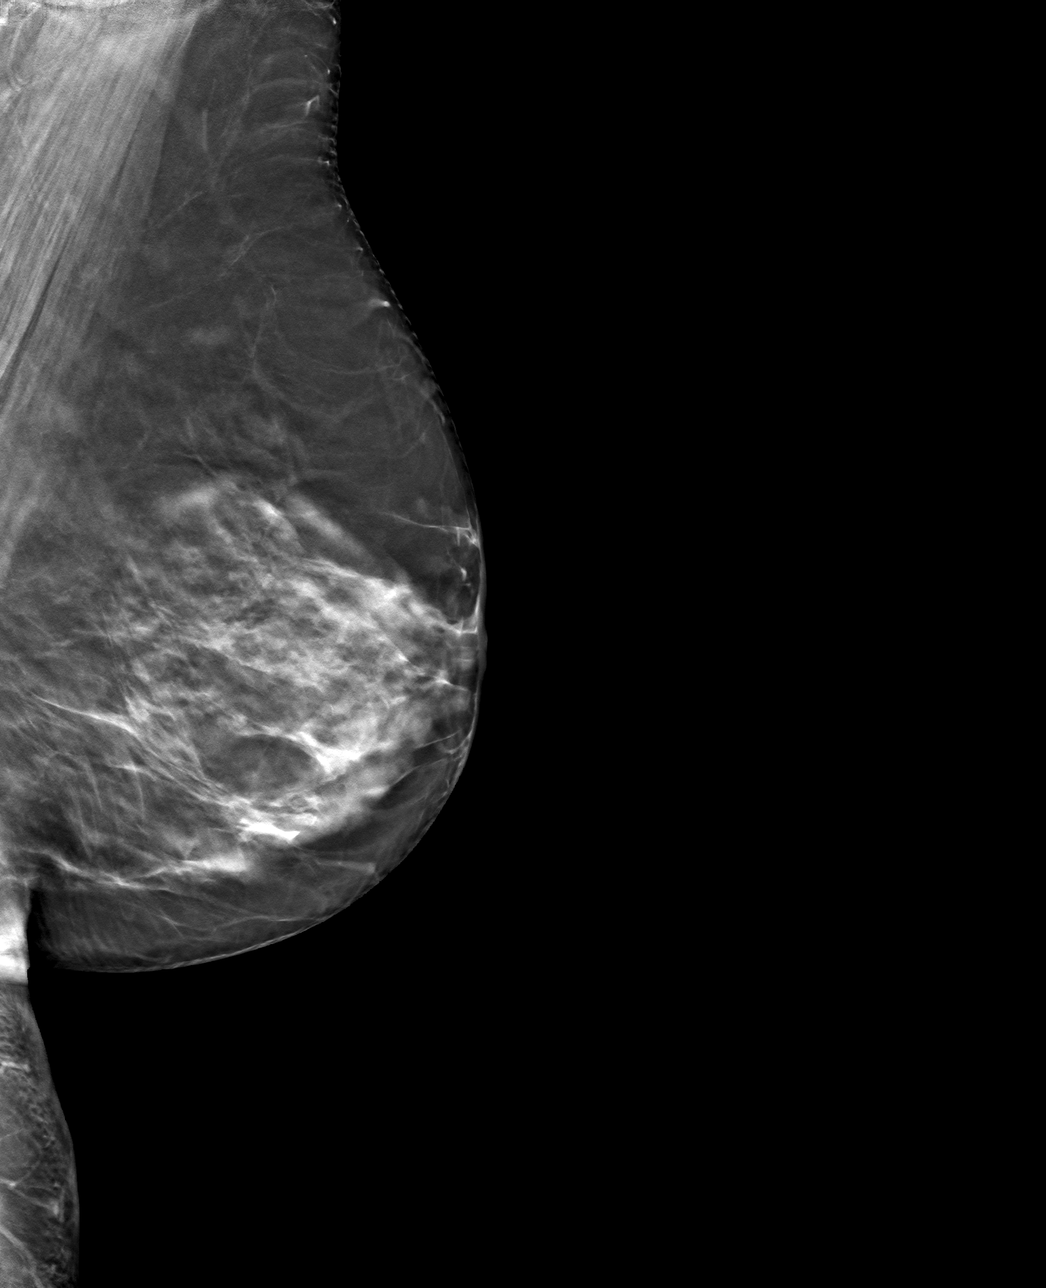

[4 of 12 positions shown; findings below may reference images not displayed]

ACR Breast Density Category c: The breast tissue is heterogeneously
dense, which may obscure small masses.
FINDINGS: In the left breast, a possible asymmetry warrants further
evaluation. The patient has had a right mastectomy. Images were
processed with CAD.
IMPRESSION: Further evaluation is suggested for possible asymmetry in the left
breast.

RECOMMENDATION:
Diagnostic mammogram and possibly ultrasound of the left breast.
(Code:[VO])

The patient will be contacted regarding the findings, and additional
imaging will be scheduled.

BI-RADS CATEGORY  0: Incomplete. Need additional imaging evaluation
and/or prior mammograms for comparison.

## 2019-11-19 ENCOUNTER — Other Ambulatory Visit: Payer: Self-pay | Admitting: Family Medicine

## 2019-11-19 DIAGNOSIS — R928 Other abnormal and inconclusive findings on diagnostic imaging of breast: Secondary | ICD-10-CM

## 2019-11-26 ENCOUNTER — Ambulatory Visit
Admission: RE | Admit: 2019-11-26 | Discharge: 2019-11-26 | Disposition: A | Discharge status: Home / Self Care | Payer: Medicare Other | Source: Ambulatory Visit | Attending: Family Medicine | Admitting: Family Medicine

## 2019-11-26 ENCOUNTER — Other Ambulatory Visit: Payer: Self-pay

## 2019-11-26 DIAGNOSIS — R922 Inconclusive mammogram: Secondary | ICD-10-CM | POA: Diagnosis not present

## 2019-11-26 DIAGNOSIS — N6489 Other specified disorders of breast: Secondary | ICD-10-CM | POA: Diagnosis not present

## 2019-11-26 DIAGNOSIS — R928 Other abnormal and inconclusive findings on diagnostic imaging of breast: Secondary | ICD-10-CM

## 2019-11-26 IMAGING — MG MM DIGITAL DIAGNOSTIC UNILAT*L* W/ TOMO W/ CAD
4 series · 4 of 12 positions shown · non-contrast
Comparison: Previous exam(s).

CLINICAL DATA: Patient recalled from screening for left breast
asymmetry.

EXAM:
DIGITAL DIAGNOSTIC LEFT MAMMOGRAM WITH CAD AND TOMO
ULTRASOUND LEFT BREAST

[L CC synth-2D]
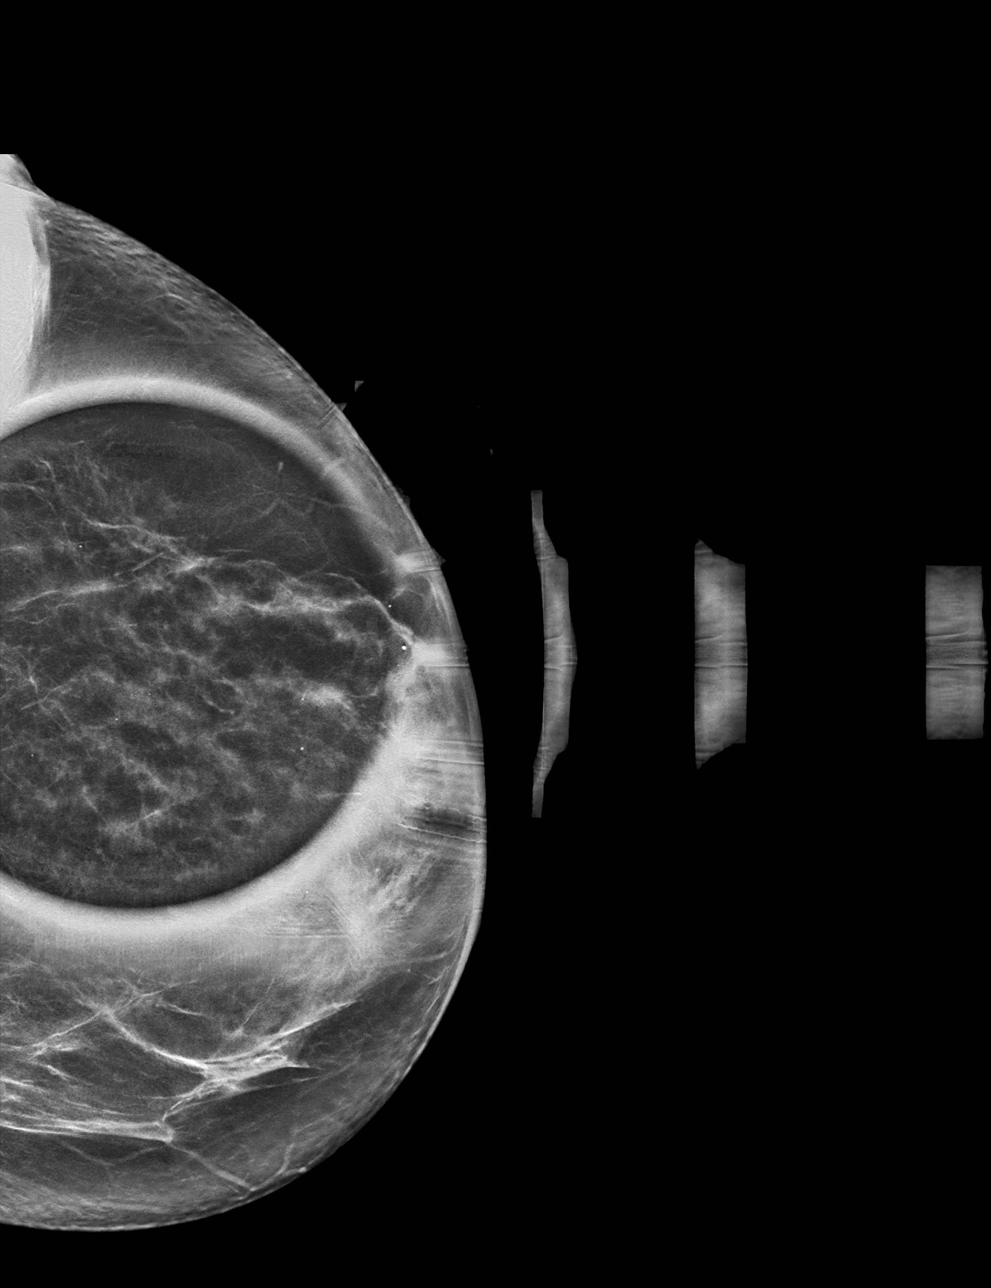

[L MLO synth-2D]
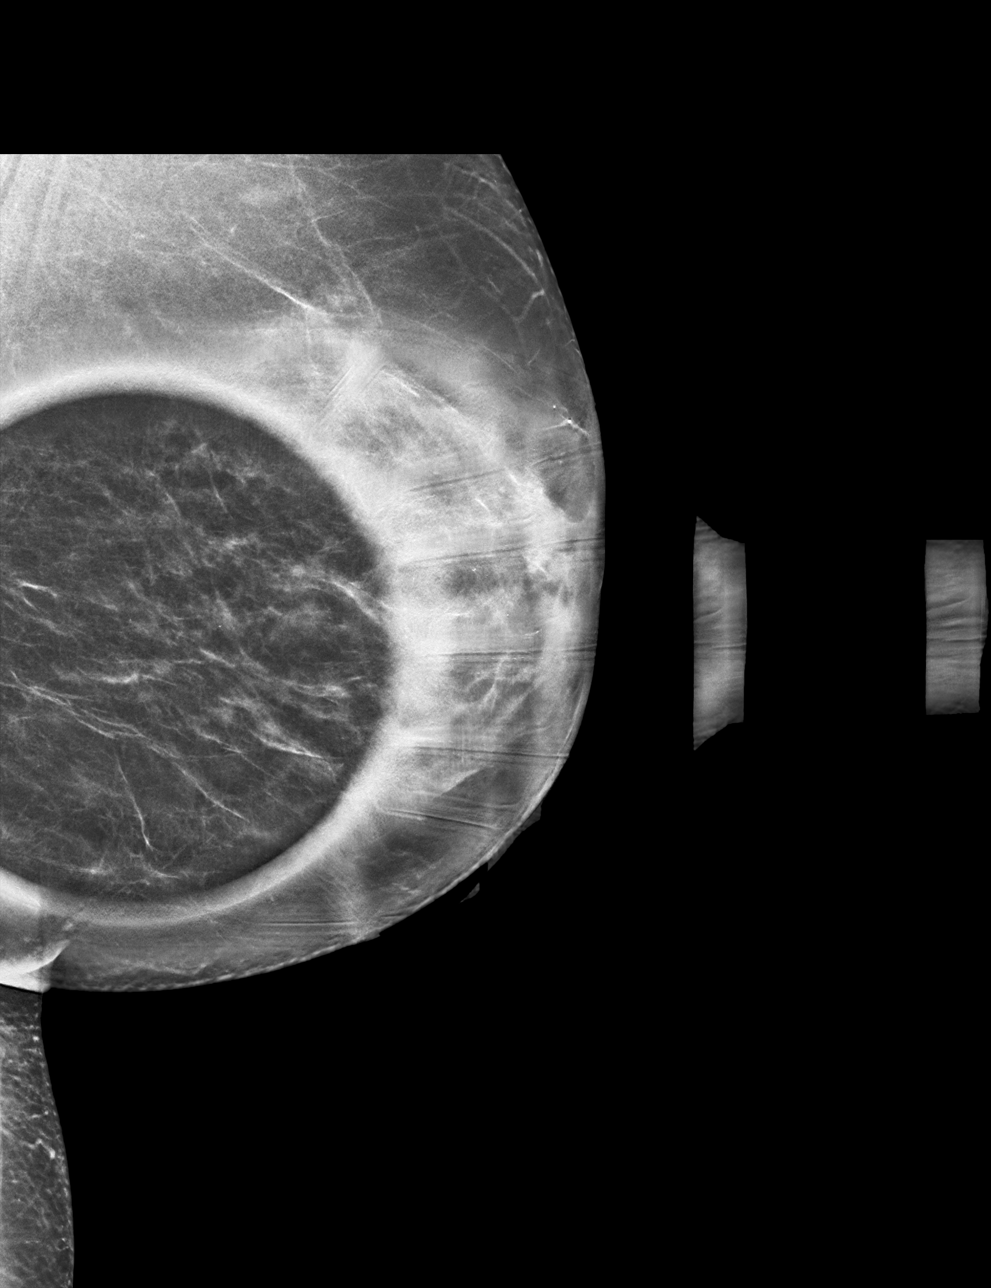

[L MLO tomo · tomo slice 31/62.0]
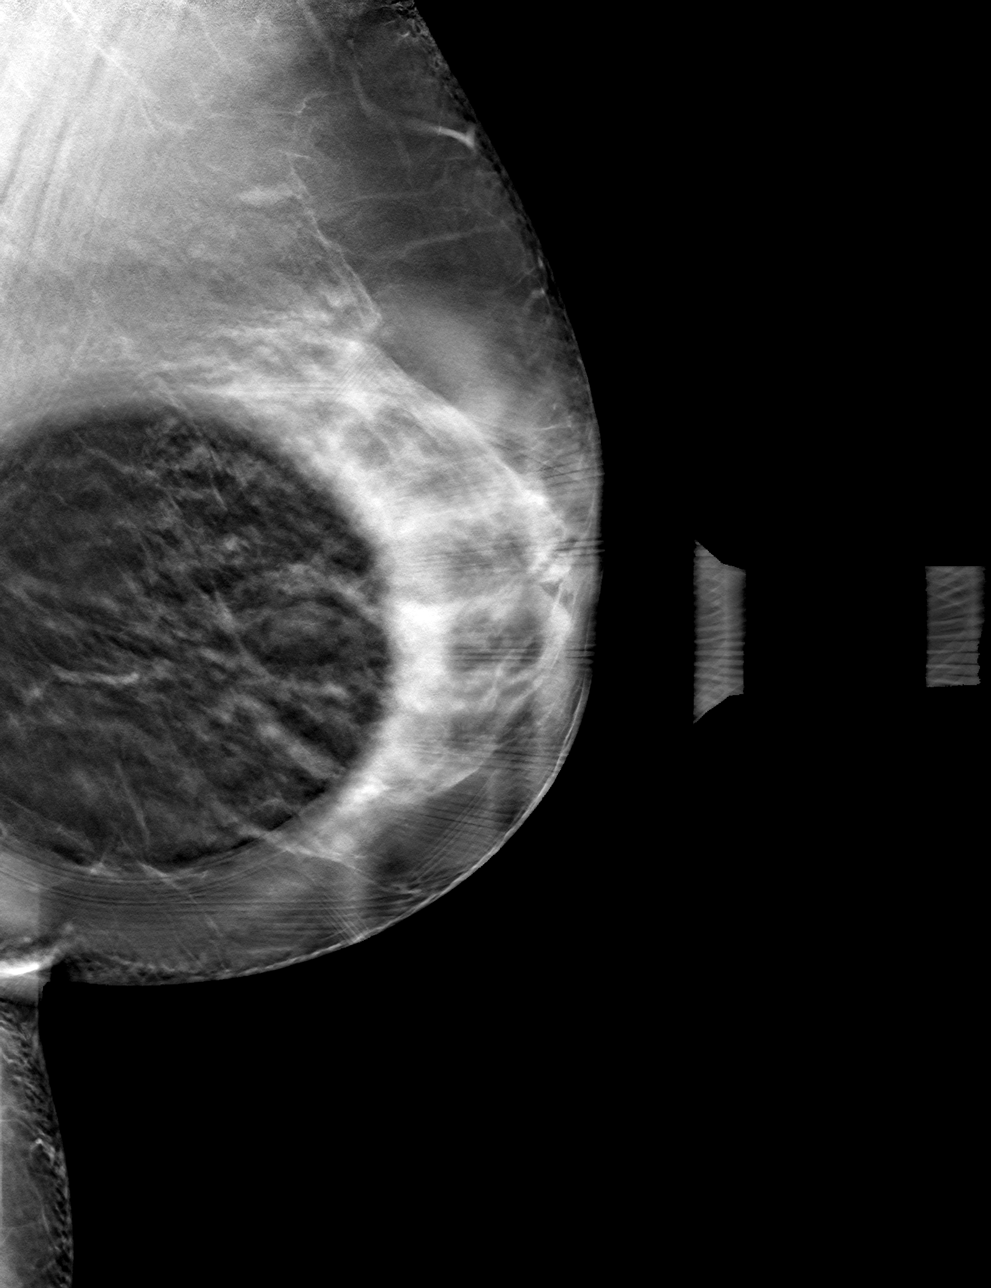

[L CC tomo · tomo slice 31/61.0]
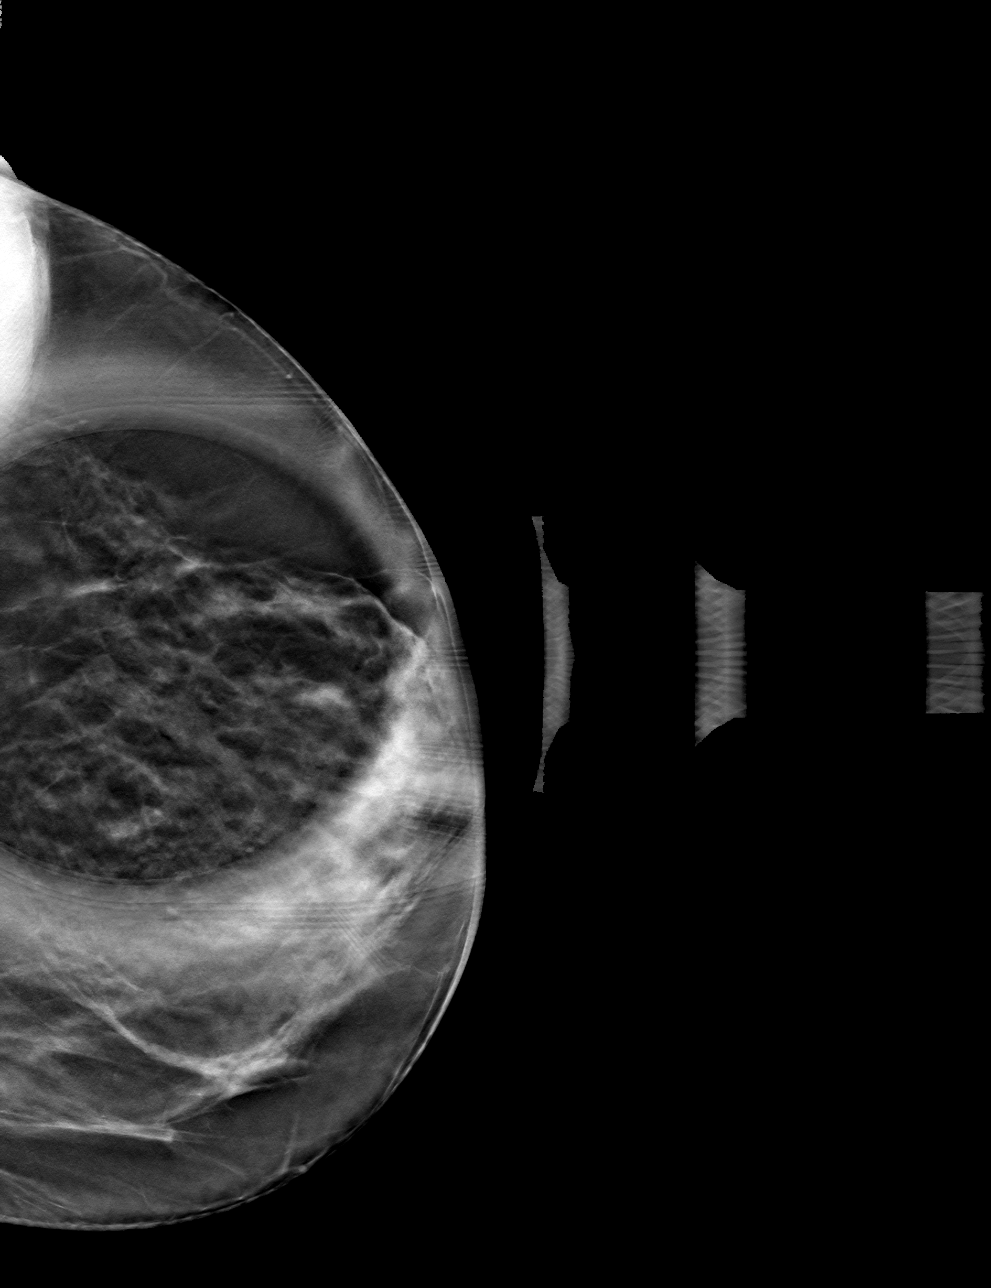

[4 of 12 positions shown; findings below may reference images not displayed]

ACR Breast Density Category c: The breast tissue is heterogeneously
dense, which may obscure small masses.
FINDINGS: Questioned asymmetry within the outer left breast slightly
inferiorly appeared to resolve with additional imaging. There is
residual surrounding dense fibroglandular tissue.

Mammographic images were processed with CAD.

Targeted ultrasound is performed, showing normal tissue without
suspicious mass left breast 3 o'clock position 5 cm from the nipple.
IMPRESSION: No mammographic evidence for malignancy.

RECOMMENDATION:
Screening mammogram in one year.(Code:[MH])

I have discussed the findings and recommendations with the patient.
If applicable, a reminder letter will be sent to the patient
regarding the next appointment.

BI-RADS CATEGORY  2: Benign.

## 2019-11-26 IMAGING — US US BREAST*L* LIMITED INC AXILLA
1 series · 4 of 4 positions shown · non-contrast
Comparison: Previous exam(s).

CLINICAL DATA: Patient recalled from screening for left breast
asymmetry.

EXAM:
DIGITAL DIAGNOSTIC LEFT MAMMOGRAM WITH CAD AND TOMO
ULTRASOUND LEFT BREAST

[Series 1: us breast*left* limited inc axilla · 0.07mm/px · 4 of 4 slices shown]
[im 1/4]
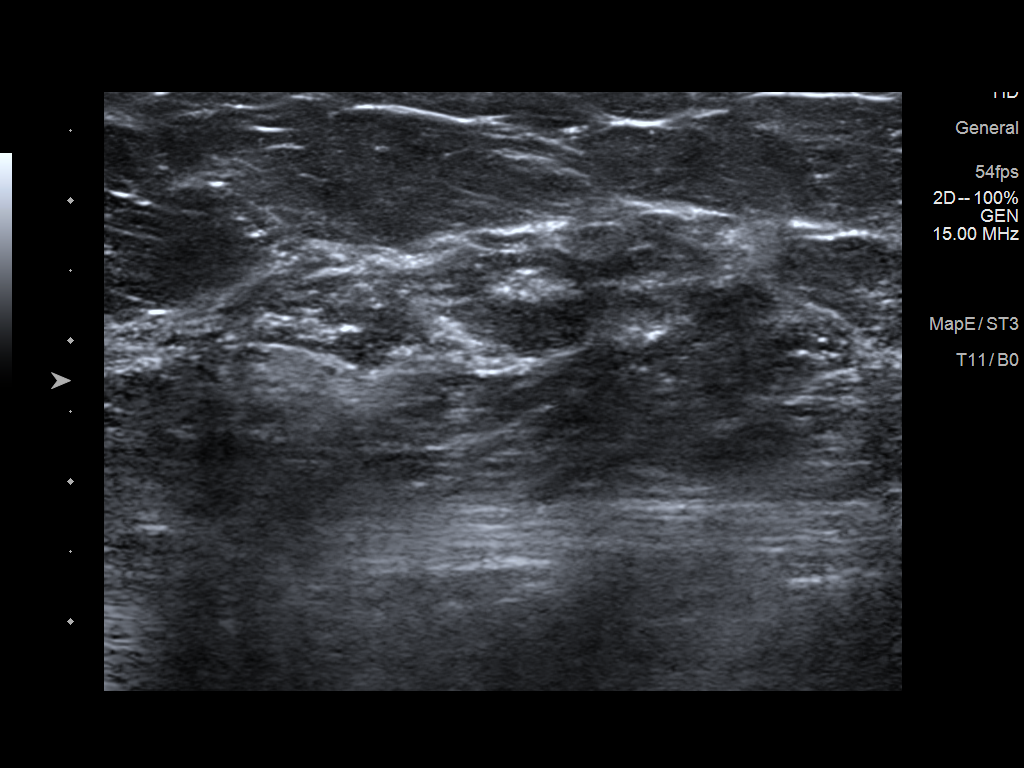
[im 2/4]
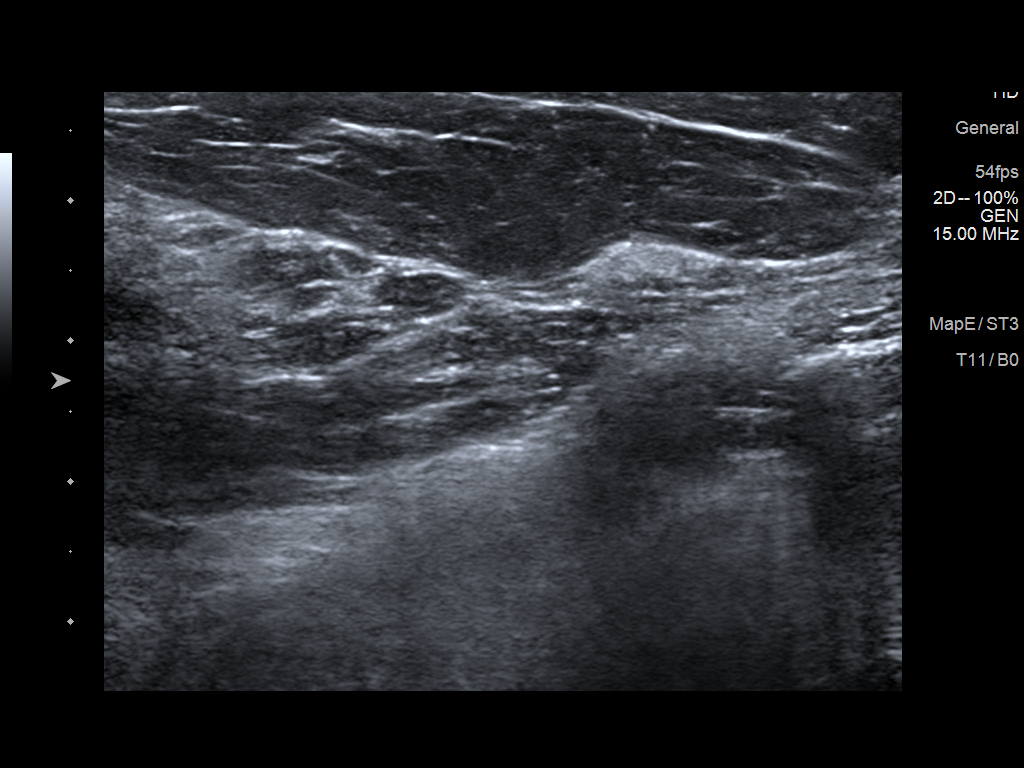
[im 3/4]
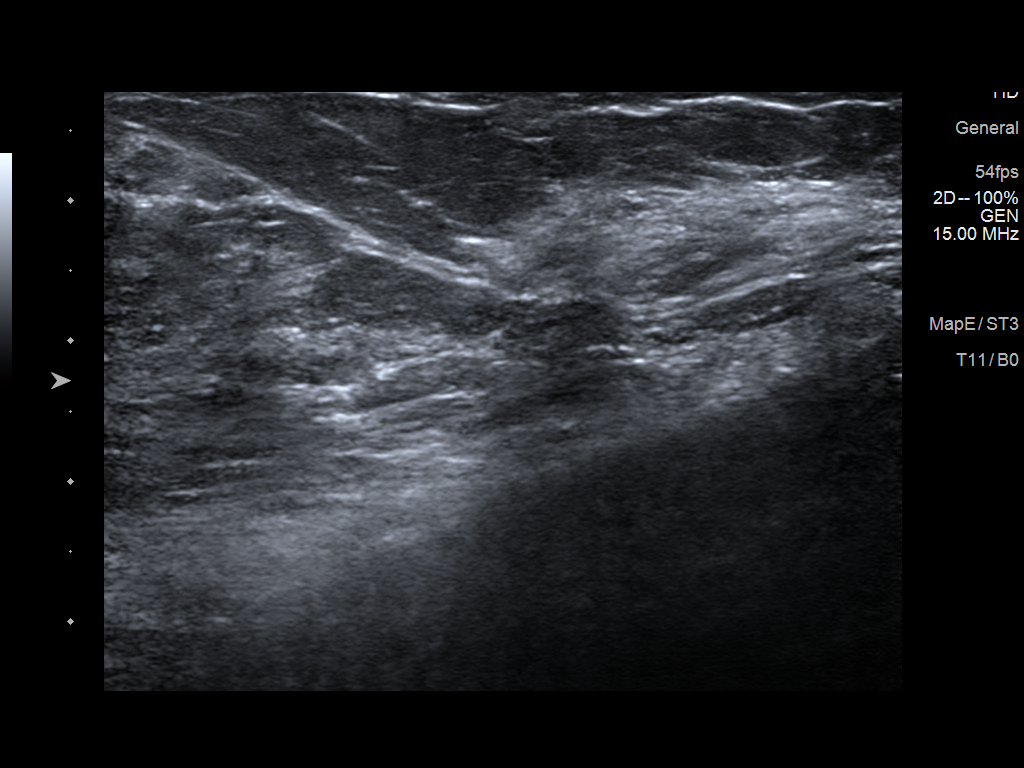
[im 4/4]
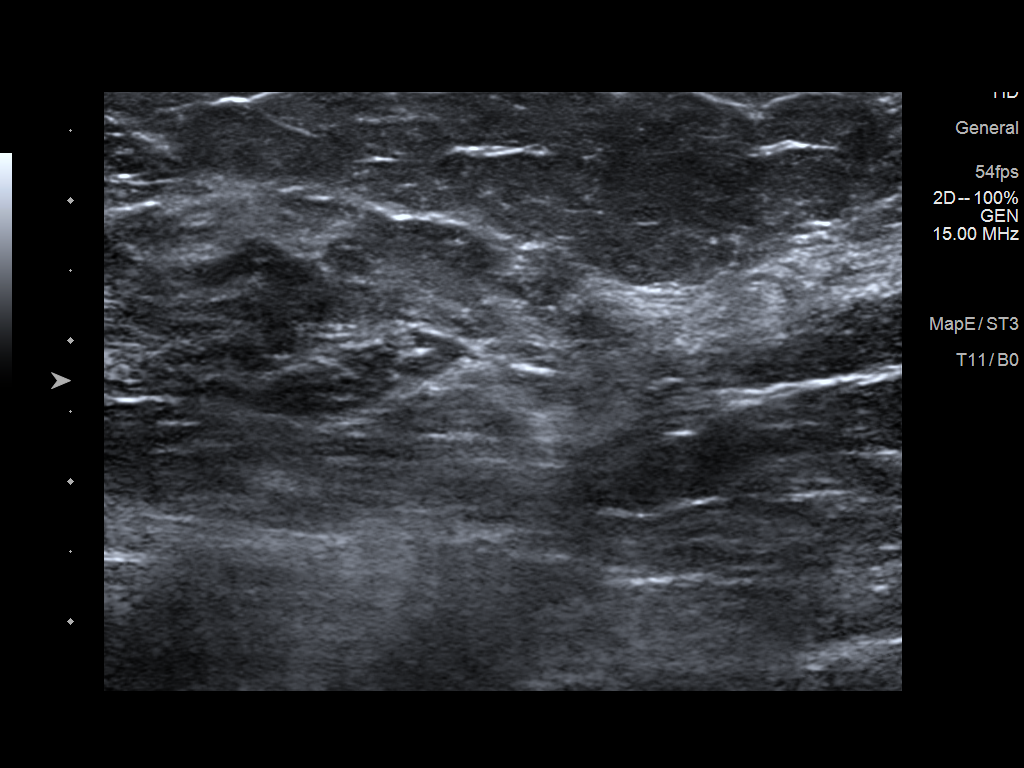

[4 of 4 positions shown; findings below may reference images not displayed]

ACR Breast Density Category c: The breast tissue is heterogeneously
dense, which may obscure small masses.
FINDINGS: Questioned asymmetry within the outer left breast slightly
inferiorly appeared to resolve with additional imaging. There is
residual surrounding dense fibroglandular tissue.

Mammographic images were processed with CAD.

Targeted ultrasound is performed, showing normal tissue without
suspicious mass left breast 3 o'clock position 5 cm from the nipple.
IMPRESSION: No mammographic evidence for malignancy.

RECOMMENDATION:
Screening mammogram in one year.(Code:[MH])

I have discussed the findings and recommendations with the patient.
If applicable, a reminder letter will be sent to the patient
regarding the next appointment.

BI-RADS CATEGORY  2: Benign.

## 2019-12-15 NOTE — H&P (Signed)
Patient ID:   [id number] Patient: [name]  Date of Birth: 07/01/1950 Visit Type: Office Visit   Date: 09/14/2019 04:00 PM Provider: Marchia Meiers. Vertell Limber MD   This 69 year old female presents for neck pain.  HISTORY OF PRESENT ILLNESS: 1.  neck pain  This is a telehealth visit due to the COVID-19 pandemic.  The patient continues to have problems related to her neck and feels that things are relatively stable and wants to go ahead and wait until the middle of September to pursue her cervical spinal surgery.  This will consist of anterior cervical decompression and fusion at the C4-5, C5-6, C6-7 levels.  She saw Dr. Constance Holster for evaluation of her goiter and his opinion was that this was all related to reflux and that she did not need to have the goiter surgically addressed.  The patient is skeptical about reflux as the cause of her swallowing discomfort but does agree that we can leave the goiter alone and just pursue her cervical spinal pathology, cord compression, cervical radicular symptoms.      Medical/Surgical/Interim History Reviewed, no change.  Last detailed document date:08/05/2019.     PAST MEDICAL HISTORY, SURGICAL HISTORY, FAMILY HISTORY, SOCIAL HISTORY AND REVIEW OF SYSTEMS I have reviewed the patient's past medical, surgical, family and social history as well as the comprehensive review of systems as included on the [facility] history form dated 07/20/2019, which I have signed.  Family History: Reviewed, no changes.  Last detailed document date:08/05/2019.   Social History: Reviewed, no changes. Last detailed document date: 08/05/2019.    MEDICATIONS: (added, continued or stopped this visit) Started Medication Directions Instruction Stopped  Acidophilus Probiotic Blend 175 mg capsule take daily    cyclobenzaprine 10 mg tablet take 1 tablet by oral route 3 times every day    esomeprazole magnesium 20 mg capsule,delayed  release take 1 capsule by oral route  every day at least 1 hour before a meal swallowing whole. Do not crush or chew granules.    Flonase Allergy Relief 50 mcg/actuation nasal spray,suspension spray 1 - 2 spray by intranasal route  every day in each nostril as needed    multivitamin tablet take 1 tablet by mouth daily    nabumetone 500 mg tablet take 1 tablet by oral route 2 times every day as needed    Restasis MultiDose 0.05 % eye drops instill 1 drop by ophthalmic route  every 12 hours into affected eye(s)    Systane (PF) 0.4 %-0.3 % eye drops in a dropperette instill 1 drop in each eye    tamoxifen 20 mg tablet take 1 tablet by oral route  every day    vitamin E 400 unit capsule take 1 tablet daily      ALLERGIES: Ingredient Reaction Medication Name Comment ADHESIVE TAPE    AMOXICILLIN TRIHYDRATE  AUGMENTIN  ASPIRIN    PENICILLINS    POTASSIUM CLAVULANATE  AUGMENTIN   Reviewed, no changes.    PHYSICAL EXAM:  Vitals Date Temp F BP Pulse Ht In Wt Lb BMI BSA Pain Score 09/14/2019        5/10     IMPRESSION:  The patient is still complaining of neck and bilateral upper extremity pain.  We are going to go ahead with anterior cervical decompression fusion C4-5, C5-6, C6-7 levels in the middle of September.  ENT explained to her that she did not need her goiter treated  PLAN: Proceed with anterior cervical decompression and fusion  C4-5, C5-6, C6-7 levels in the middle of September  Orders: Diagnostic Procedures: Assessment Procedure Q98.264 Cervical Spine- Lateral  Assessment/Plan  # Detail Type Description  1. Assessment Herniated nucleus pulposus, C5-6 (M50.222).     2. Assessment Radiculopathy, cervical region (M54.12).     3. Assessment Myelopathy concurrent with and due to spinal stenosis of cervical region (M48.02).       Pain Management Plan Pain Scale: 5/10. Method: Numeric  Pain Intensity Scale. Location: neck. Onset: 08/05/2019. Duration: varies. Quality: discomforting. Pain management follow-up plan of care: Patient will continue medication management..              Provider:  Marchia Meiers. Vertell Limber MD  09/19/2019 01:02 PM    Dictation edited by: Marchia Meiers. Vertell Limber    CC Providers: [facility] [place],  [place]  [id number]   Antonia Ahern  [address] [place], [place] [address]               Electronically signed by Marchia Meiers. Vertell Limber MD on 09/19/2019 01:02 PM

## 2019-12-17 NOTE — Pre-Procedure Instructions (Signed)
[name]  12/17/2019    Your procedure is scheduled on Tuesday, October 28..  Report to [facility], Main Entrance or Entrance "A" at 5:30 AM                   Your surgery or procedure is scheduled to begin at 7:30 A.M.   Call this number if you have problems the morning of [phone]  This is the number for the Pre- Surgical Desk.        For any other questions, please call [phone], Monday - Friday 8 AM - 4 PM.   Remember:  Do not eat or drink after midnight Monday, October 27.   Take these medicines the morning of surgery with A SIP OF WATER:   esomeprazole (NEXIUM)             fluticasone (FLONASE)             tamoxifen (NOLVADEX)                 You may use cycloSPORINE (RESTASIS) eye drops, SYSTANE OP eye drops  Take if needed: acetaminophen (TYLENOL) cyclobenzaprine (FLEXERIL   STOP taking Aspirin, Aspirin Products (Goody Powder, Excedrin Migraine), Ibuprofen (Advil), Naproxen (Aleve), Vitamins and Herbal Products (ie Fish Oil).  Special instructions:   [facility]- Preparing For Surgery  Before surgery, you can play an important role. Because skin is not sterile, your skin needs to be as free of germs as possible. You can reduce the number of germs on your skin by washing with CHG (chlorahexidine gluconate) Soap before surgery.  CHG is an antiseptic cleaner which kills germs and bonds with the skin to continue killing germs even after washing.    Oral Hygiene is also important to reduce your risk of infection.  Remember - BRUSH YOUR TEETH THE MORNING OF SURGERY WITH YOUR REGULAR TOOTHPASTE  Please do not use if you have an allergy to CHG or antibacterial soaps. If your skin becomes reddened/irritated stop using the CHG.  Do not shave (including legs and underarms) for at least 48 hours prior to first CHG shower. It is OK to shave your face.  Please follow these instructions carefully.   1. Shower the NIGHT BEFORE SURGERY and the  MORNING OF SURGERY with CHG.   2. If you chose to wash your hair, wash your hair first as usual with your normal shampoo.  3. After you shampoo, wash your face and private area with the soap you use at home, then rinse your hair and body thoroughly to remove the shampoo and soap.  4. Use CHG as you would any other liquid soap. You can apply CHG directly to the skin and wash gently with a scrungie or a clean washcloth.   5. Apply the CHG Soap to your body ONLY FROM THE NECK DOWN.  Do not use on open wounds or open sores. Avoid contact with your eyes, ears, mouth and genitals (private parts).   6. Wash thoroughly, paying special attention to the area where your surgery will be performed.  7. Thoroughly rinse your body with warm water from the neck down.  8. DO NOT shower/wash with your normal soap after using and rinsing off the CHG Soap.  9. Pat yourself dry with a CLEAN TOWEL.  10. Wear CLEAN PAJAMAS to bed the night before surgery, wear comfortable clothes the morning of surgery  11. Place CLEAN SHEETS on your bed the night of your  first shower and DO NOT SLEEP WITH PETS.  Day of Surgery: Shower as instructed above. Do not wear lotions, powders, or perfumes, or deodorant. Please wear clean clothes to the hospital/surgery center.   Remember to brush your teeth WITH YOUR REGULAR TOOTHPASTE.  Do not wear jewelry, make-up or nail polish.  Do not shave 48 hours prior to surgery.    Do not bring valuables to the hospital.  [facility] is not responsible for any belongings or valuables.  Contacts, dentures or bridgework may not be worn into surgery.  Leave your suitcase in the car.  After surgery it may be brought to your room.  For patients admitted to the hospital, discharge time will be determined by your treatment team.  Patients discharged the day of surgery will not be allowed to drive home.   Please read over the fact sheets that you were given.

## 2019-12-18 ENCOUNTER — Other Ambulatory Visit (HOSPITAL_COMMUNITY)
Admission: RE | Admit: 2019-12-18 | Discharge: 2019-12-18 | Disposition: A | Discharge status: Home / Self Care | Payer: Medicare Other | Source: Ambulatory Visit | Attending: Neurosurgery | Admitting: Neurosurgery

## 2019-12-18 ENCOUNTER — Encounter (HOSPITAL_COMMUNITY): Payer: Self-pay

## 2019-12-18 ENCOUNTER — Encounter (HOSPITAL_COMMUNITY)
Admission: RE | Admit: 2019-12-18 | Discharge: 2019-12-18 | Disposition: A | Discharge status: Home / Self Care | Payer: Medicare Other | Source: Ambulatory Visit | Attending: Neurosurgery | Admitting: Neurosurgery

## 2019-12-18 ENCOUNTER — Other Ambulatory Visit: Payer: Self-pay

## 2019-12-18 DIAGNOSIS — Z01812 Encounter for preprocedural laboratory examination: Secondary | ICD-10-CM | POA: Insufficient documentation

## 2019-12-18 DIAGNOSIS — Z20822 Contact with and (suspected) exposure to covid-19: Secondary | ICD-10-CM | POA: Insufficient documentation

## 2019-12-18 LAB — CBC
HCT: 41.5 % (ref 36.0–46.0)
Hemoglobin: 13.4 g/dL (ref 12.0–15.0)
MCH: 30.6 pg (ref 26.0–34.0)
MCHC: 32.3 g/dL (ref 30.0–36.0)
MCV: 94.7 fL (ref 80.0–100.0)
Platelets: 294 10*3/uL (ref 150–400)
RBC: 4.38 MIL/uL (ref 3.87–5.11)
RDW: 13.8 % (ref 11.5–15.5)
WBC: 6.3 10*3/uL (ref 4.0–10.5)
nRBC: 0 % (ref 0.0–0.2)

## 2019-12-18 LAB — COMPREHENSIVE METABOLIC PANEL
ALT: 16 U/L (ref 0–44)
AST: 19 U/L (ref 15–41)
Albumin: 3.5 g/dL (ref 3.5–5.0)
Alkaline Phosphatase: 61 U/L (ref 38–126)
Anion gap: 10 (ref 5–15)
BUN: 14 mg/dL (ref 8–23)
CO2: 24 mmol/L (ref 22–32)
Calcium: 9 mg/dL (ref 8.9–10.3)
Chloride: 107 mmol/L (ref 98–111)
Creatinine, Ser: 0.87 mg/dL (ref 0.44–1.00)
GFR calc Af Amer: >60 mL/min (ref >60)
GFR calc non Af Amer: >60 mL/min (ref >60)
Glucose, Bld: 105 mg/dL — ABNORMAL HIGH (ref 70–99)
Potassium: 4.3 mmol/L (ref 3.5–5.1)
Sodium: 141 mmol/L (ref 135–145)
Total Bilirubin: 0.5 mg/dL (ref 0.3–1.2)
Total Protein: 6.6 g/dL (ref 6.5–8.1)

## 2019-12-18 LAB — SURGICAL PCR SCREEN
MRSA, PCR: NEGATIVE
Staphylococcus aureus: NEGATIVE

## 2019-12-18 LAB — TYPE AND SCREEN
ABO/RH(D): A POS
Antibody Screen: NEGATIVE

## 2019-12-18 LAB — SARS CORONAVIRUS 2 (TAT 6-24 HRS): SARS Coronavirus 2: NEGATIVE

## 2019-12-18 NOTE — Pre-Procedure Instructions (Signed)
Your procedure is scheduled on Tuesday, December 22, 2019 .  Report to [facility] Main Entrance "A" at 5:30 A.M., and check in at the Admitting office.  Call this number if you have problems the morning of surgery:  [phone]  Call [phone] if you have any questions prior to your surgery date Monday-Friday 8am-4pm    Remember:  Do not eat or drink after midnight the night before your surgery    Take these medicines the morning of surgery with A SIP OF WATER:  esomeprazole (NEXIUM)             fluticasone (FLONASE)             tamoxifen (NOLVADEX)              cycloSPORINE (RESTASIS) eye drops  Take if needed: acetaminophen (TYLENOL) cyclobenzaprine (FLEXERIL) olopatadine (PATADAY) 0.1 % eye drops     SYSTANE OP eye drops  As of today, STOP taking any Aspirin (unless otherwise instructed by your surgeon) Aleve, Naproxen, Ibuprofen, Motrin, Advil, Goody's, BC's, all herbal medications, fish oil, and all vitamins.                      Do not wear jewelry, make up, or nail polish            Do not wear lotions, powders, perfumes, or deodorant.            Do not shave 48 hours prior to surgery.            Do not bring valuables to the hospital.            [facility] is not responsible for any belongings or valuables.  Do NOT Smoke (Tobacco/Vaping) or drink Alcohol 24 hours prior to your procedure If you use a CPAP at night, you may bring all equipment for your overnight stay.   Contacts, glasses, dentures or bridgework may not be worn into surgery.      For patients admitted to the hospital, discharge time will be determined by your treatment team.   Patients discharged the day of surgery will not be allowed to drive home, and someone needs to stay with them for 24 hours.    Special instructions:   - Preparing For Surgery  Before surgery, you can play an important role. Because skin is not sterile, your skin needs to be as free of germs as possible.  You can reduce the number of germs on your skin by washing with CHG (chlorahexidine gluconate) Soap before surgery.  CHG is an antiseptic cleaner which kills germs and bonds with the skin to continue killing germs even after washing.    Oral Hygiene is also important to reduce your risk of infection.  Remember - BRUSH YOUR TEETH THE MORNING OF SURGERY WITH YOUR REGULAR TOOTHPASTE  Please do not use if you have an allergy to CHG or antibacterial soaps. If your skin becomes reddened/irritated stop using the CHG.  Do not shave (including legs and underarms) for at least 48 hours prior to first CHG shower. It is OK to shave your face.  Please follow these instructions carefully.   1. Shower the NIGHT BEFORE SURGERY and the MORNING OF SURGERY with CHG Soap.   2. If you chose to wash your hair, wash your hair first as usual with your normal shampoo.  3. After you shampoo, rinse your hair and body thoroughly to remove the shampoo.  4. Use CHG  as you would any other liquid soap. You can apply CHG directly to the skin and wash gently with a scrungie or a clean washcloth.   5. Apply the CHG Soap to your body ONLY FROM THE NECK DOWN.  Do not use on open wounds or open sores. Avoid contact with your eyes, ears, mouth and genitals (private parts). Wash Face and genitals (private parts)  with your normal soap.   6. Wash thoroughly, paying special attention to the area where your surgery will be performed.  7. Thoroughly rinse your body with warm water from the neck down.  8. DO NOT shower/wash with your normal soap after using and rinsing off the CHG Soap.  9. Pat yourself dry with a CLEAN TOWEL.  10. Wear CLEAN PAJAMAS to bed the night before surgery  11. Place CLEAN SHEETS on your bed the night of your first shower and DO NOT SLEEP WITH PETS.   Day of Surgery: Wear Clean/Comfortable clothing the morning of surgery Do not apply any deodorants/lotions.   Remember to brush your teeth WITH YOUR  REGULAR TOOTHPASTE.   Please read over the following fact sheets that you were given.

## 2019-12-18 NOTE — Progress Notes (Signed)
PCP - Dr. [name] Cardiologist - Denies Oncologist: Dr. Curt Bears  PPM/ICD - Denies   Chest x-ray - 07/15/19 EKG - N/A Stress Test - Denies ECHO - Denies Cardiac Cath - Denies  Pt denies being diabetic.  Sleep Study - Denies   Blood Thinner Instructions: N/A Aspirin Instructions: N/A  ERAS Protcol - No  COVID TEST- 12/18/19 @ 9728   Anesthesia review: Yes, cancer treatment  Patient denies shortness of breath, fever, cough and chest pain at PAT appointment   All instructions explained to the patient, with a verbal understanding of the material. Patient agrees to go over the instructions while at home for a better understanding. Patient also instructed to self quarantine after being tested for COVID-19. The opportunity to ask questions was provided.

## 2019-12-21 NOTE — Anesthesia Preprocedure Evaluation (Addendum)
Anesthesia Evaluation  Patient identified by MRN, date of birth, ID band Patient awake    Reviewed: Allergy & Precautions, H&P , NPO status , Patient's Chart, lab work & pertinent test results  History of Anesthesia Complications (+) PONV  Airway Mallampati: II  TM Distance: >3 FB Neck ROM: Limited    Dental no notable dental hx. (+) Teeth Intact, Dental Advisory Given   Pulmonary neg pulmonary ROS, former smoker,    Pulmonary exam normal breath sounds clear to auscultation       Cardiovascular Exercise Tolerance: Good negative cardio ROS   Rhythm:Regular Rate:Normal     Neuro/Psych  Headaches, Anxiety    GI/Hepatic Neg liver ROS, GERD  Medicated and Controlled,  Endo/Other  negative endocrine ROS  Renal/GU negative Renal ROS  negative genitourinary   Musculoskeletal  (+) Arthritis , Osteoarthritis,    Abdominal   Peds  Hematology negative hematology ROS (+)   Anesthesia Other Findings   Reproductive/Obstetrics negative OB ROS                            Anesthesia Physical Anesthesia Plan  ASA: II  Anesthesia Plan: General   Post-op Pain Management:    Induction: Intravenous  PONV Risk Score and Plan: 4 or greater and Ondansetron, Dexamethasone, Midazolam and Scopolamine patch - Pre-op  Airway Management Planned: Oral ETT  Additional Equipment:   Intra-op Plan:   Post-operative Plan: Extubation in OR  Informed Consent: I have reviewed the patients History and Physical, chart, labs and discussed the procedure including the risks, benefits and alternatives for the proposed anesthesia with the patient or authorized representative who has indicated his/her understanding and acceptance.     Dental advisory given  Plan Discussed with: CRNA  Anesthesia Plan Comments:        Anesthesia Quick Evaluation

## 2019-12-22 ENCOUNTER — Ambulatory Visit (HOSPITAL_COMMUNITY)
Admission: RE | Disposition: A | Discharge status: Home / Self Care | Payer: Self-pay | Source: Home / Self Care | Attending: Neurosurgery

## 2019-12-22 ENCOUNTER — Ambulatory Visit (HOSPITAL_COMMUNITY): Payer: Medicare Other | Admitting: Anesthesiology

## 2019-12-22 ENCOUNTER — Other Ambulatory Visit: Payer: Self-pay

## 2019-12-22 ENCOUNTER — Encounter (HOSPITAL_COMMUNITY): Payer: Self-pay | Admitting: Neurosurgery

## 2019-12-22 ENCOUNTER — Ambulatory Visit (HOSPITAL_COMMUNITY): Payer: Medicare Other | Admitting: Vascular Surgery

## 2019-12-22 ENCOUNTER — Observation Stay (HOSPITAL_COMMUNITY)
Admission: RE | Admit: 2019-12-22 | Discharge: 2019-12-23 | Disposition: A | Discharge status: Home / Self Care | Payer: Medicare Other | Attending: Neurosurgery | Admitting: Neurosurgery

## 2019-12-22 ENCOUNTER — Ambulatory Visit (HOSPITAL_COMMUNITY): Payer: Medicare Other

## 2019-12-22 DIAGNOSIS — M542 Cervicalgia: Secondary | ICD-10-CM | POA: Diagnosis present

## 2019-12-22 DIAGNOSIS — Z981 Arthrodesis status: Secondary | ICD-10-CM | POA: Diagnosis not present

## 2019-12-22 DIAGNOSIS — M4802 Spinal stenosis, cervical region: Secondary | ICD-10-CM | POA: Insufficient documentation

## 2019-12-22 DIAGNOSIS — M50222 Other cervical disc displacement at C5-C6 level: Secondary | ICD-10-CM | POA: Diagnosis not present

## 2019-12-22 DIAGNOSIS — M5412 Radiculopathy, cervical region: Secondary | ICD-10-CM | POA: Diagnosis not present

## 2019-12-22 DIAGNOSIS — M50121 Cervical disc disorder at C4-C5 level with radiculopathy: Secondary | ICD-10-CM | POA: Diagnosis not present

## 2019-12-22 DIAGNOSIS — M4322 Fusion of spine, cervical region: Secondary | ICD-10-CM | POA: Diagnosis not present

## 2019-12-22 DIAGNOSIS — Z419 Encounter for procedure for purposes other than remedying health state, unspecified: Secondary | ICD-10-CM

## 2019-12-22 DIAGNOSIS — M50021 Cervical disc disorder at C4-C5 level with myelopathy: Secondary | ICD-10-CM | POA: Diagnosis not present

## 2019-12-22 DIAGNOSIS — M4722 Other spondylosis with radiculopathy, cervical region: Secondary | ICD-10-CM | POA: Diagnosis not present

## 2019-12-22 DIAGNOSIS — K219 Gastro-esophageal reflux disease without esophagitis: Secondary | ICD-10-CM | POA: Diagnosis not present

## 2019-12-22 DIAGNOSIS — G959 Disease of spinal cord, unspecified: Secondary | ICD-10-CM | POA: Diagnosis present

## 2019-12-22 HISTORY — PX: ANTERIOR CERVICAL DECOMP/DISCECTOMY FUSION: SHX1161

## 2019-12-22 LAB — ABO/RH: ABO/RH(D): A POS

## 2019-12-22 IMAGING — CR DG CERVICAL SPINE 2 OR 3 VIEWS
1 series · 1 of 1 positions shown · non-contrast
Comparison: [DATE]

CLINICAL DATA: Cervical fusion

EXAM:
CERVICAL SPINE - 2-3 VIEW

[xtable lateral]
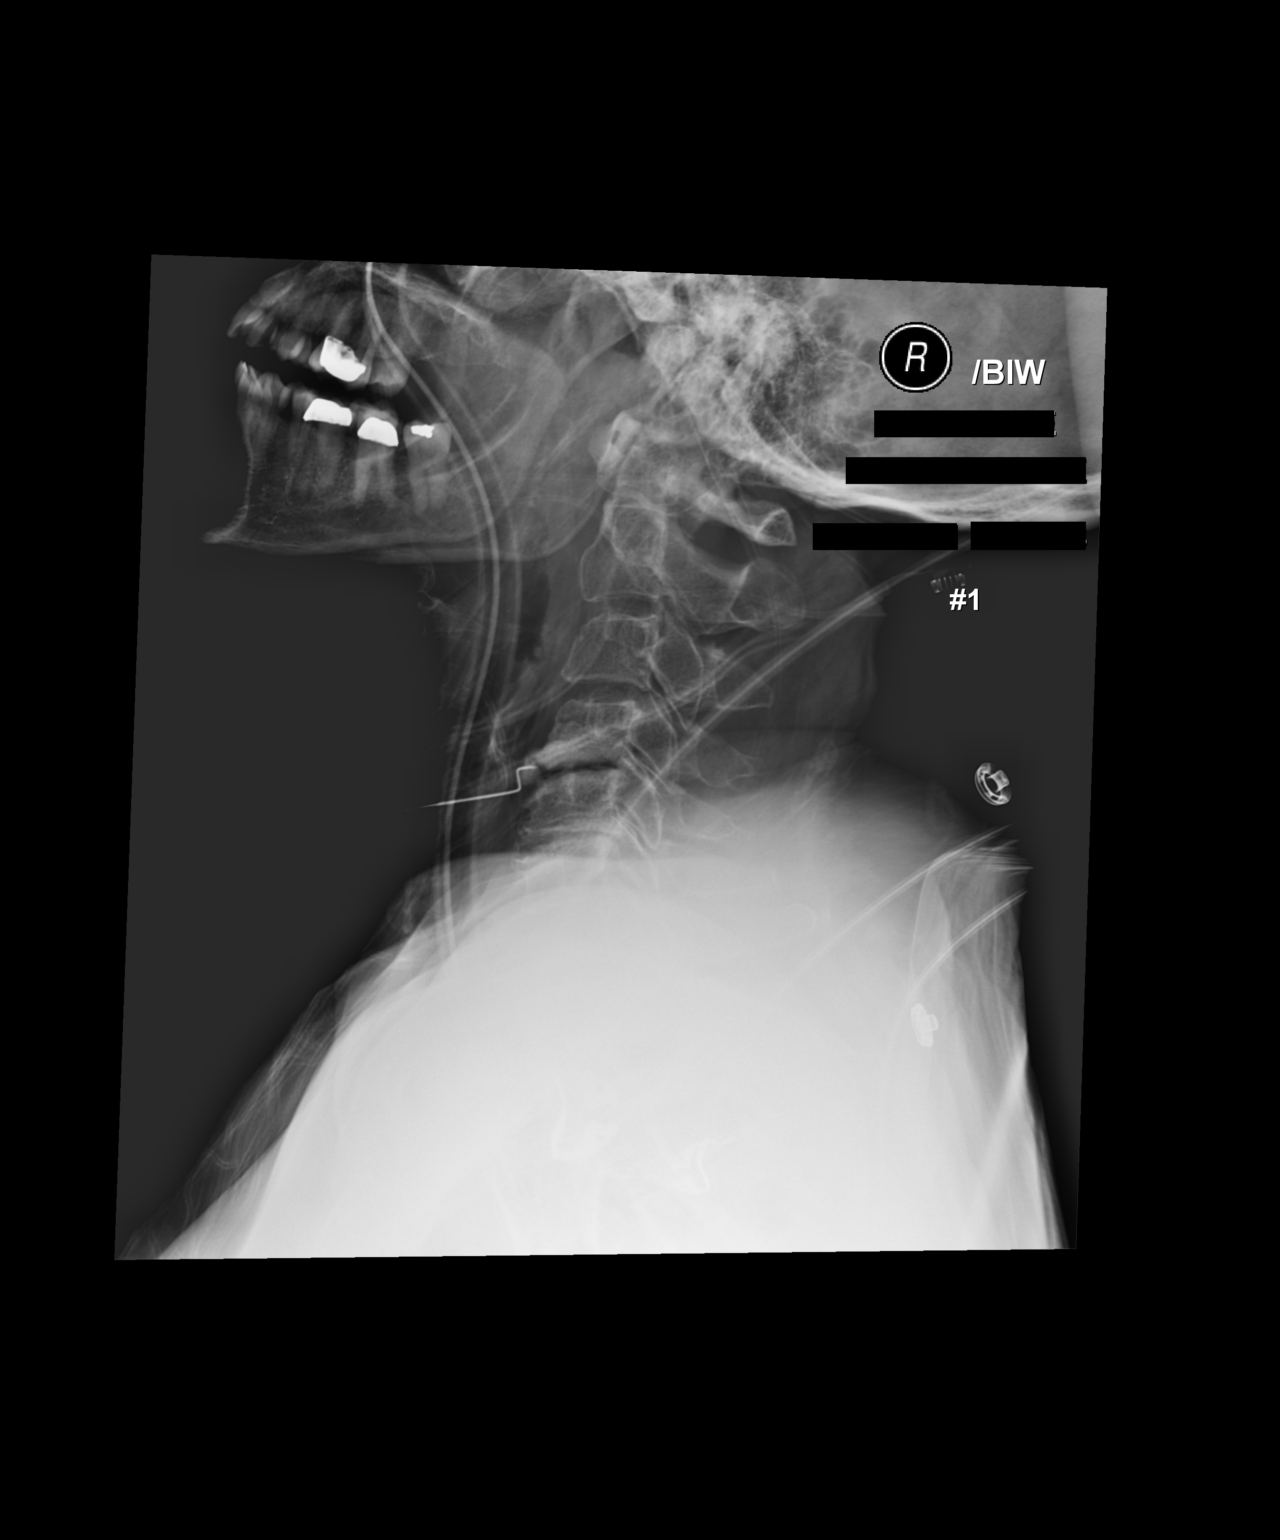

[1 of 1 positions shown; findings below may reference images not displayed]

FINDINGS: Two intraoperative films were obtained. The initial film
demonstrates a curved needle in the anterior aspect of the C4-5 disc
space. Subsequent image shows cervical fusion from C4-C7 with
anterior fixation. No soft tissue abnormality is noted.
IMPRESSION: Cervical fusion from C4-C7.

## 2019-12-22 IMAGING — CR DG CERVICAL SPINE 2 OR 3 VIEWS
1 series · 1 of 1 positions shown · non-contrast
Comparison: [DATE]

CLINICAL DATA: Cervical fusion

EXAM:
CERVICAL SPINE - 2-3 VIEW

[xtable lateral]
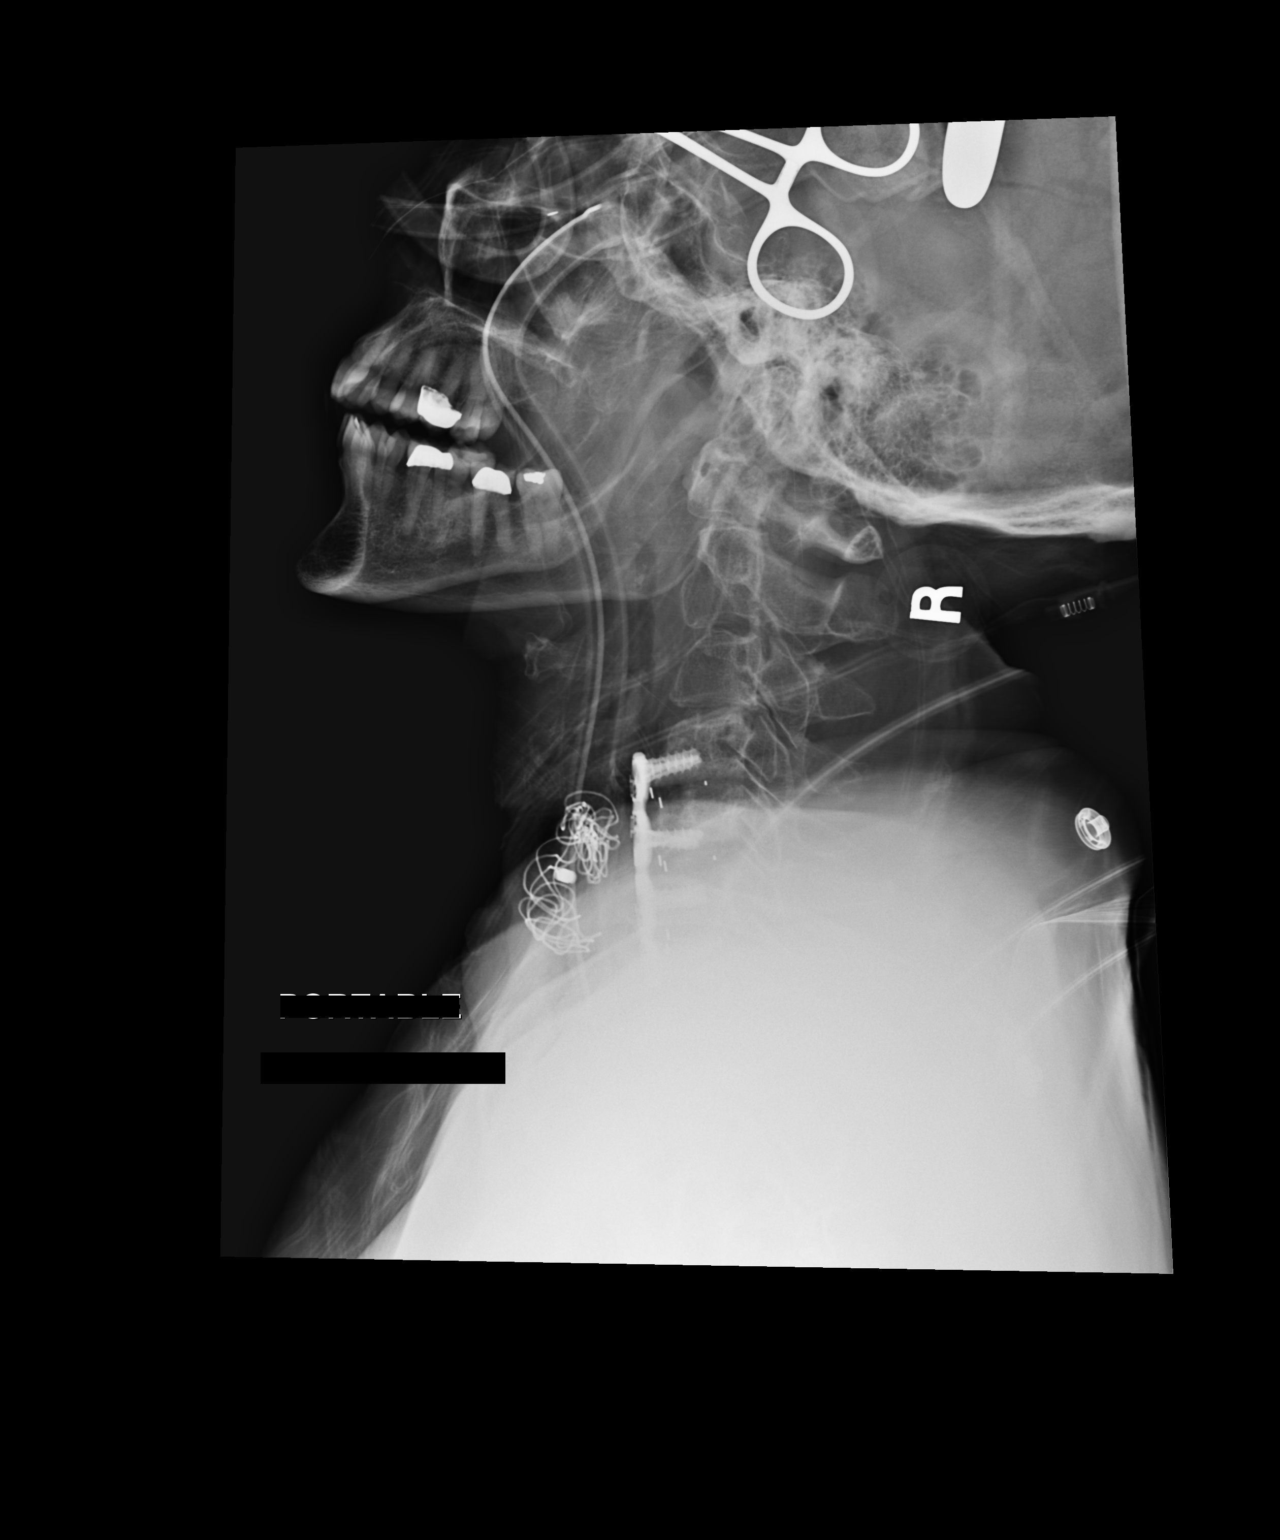

[1 of 1 positions shown; findings below may reference images not displayed]

FINDINGS: Two intraoperative films were obtained. The initial film
demonstrates a curved needle in the anterior aspect of the C4-5 disc
space. Subsequent image shows cervical fusion from C4-C7 with
anterior fixation. No soft tissue abnormality is noted.
IMPRESSION: Cervical fusion from C4-C7.

## 2019-12-22 SURGERY — ANTERIOR CERVICAL DECOMPRESSION/DISCECTOMY FUSION 3 LEVELS
Anesthesia: General

## 2019-12-22 MED ORDER — POLYETHYL GLYCOL-PROPYL GLYCOL 0.4-0.3 % OP GEL
Freq: Every day | OPHTHALMIC | Status: DC | PRN
  Filled 2019-12-22: qty 10

## 2019-12-22 MED ORDER — FENTANYL CITRATE (PF) 250 MCG/5ML IJ SOLN
INTRAMUSCULAR | Status: AC
Start: 2019-12-22 — End: ?
  Filled 2019-12-22: qty 5

## 2019-12-22 MED ORDER — OXYCODONE HCL 5 MG PO TABS
5.0000 mg | ORAL_TABLET | ORAL | Status: DC | PRN
  Administered 2019-12-22 – 2019-12-23 (×7): 10 mg via ORAL
  Filled 2019-12-22 (×7): qty 2

## 2019-12-22 MED ORDER — TRAMADOL HCL 50 MG PO TABS: 50.0000 mg | ORAL_TABLET | Freq: Four times a day (QID) | ORAL | Status: DC | PRN

## 2019-12-22 MED ORDER — CHLORHEXIDINE GLUCONATE 0.12 % MT SOLN
OROMUCOSAL | Status: AC
  Administered 2019-12-22: 15 mL via OROMUCOSAL
  Filled 2019-12-22: qty 15

## 2019-12-22 MED ORDER — FLUTICASONE PROPIONATE 50 MCG/ACT NA SUSP
2.0000 | Freq: Every day | NASAL | Status: DC
  Administered 2019-12-23: 2 via NASAL
  Filled 2019-12-22: qty 16

## 2019-12-22 MED ORDER — ORAL CARE MOUTH RINSE: 15.0000 mL | Freq: Once | OROMUCOSAL | Status: AC

## 2019-12-22 MED ORDER — SODIUM CHLORIDE 0.9% FLUSH
3.0000 mL | Freq: Two times a day (BID) | INTRAVENOUS | Status: DC
  Administered 2019-12-22: 3 mL via INTRAVENOUS

## 2019-12-22 MED ORDER — THROMBIN 5000 UNITS EX SOLR
CUTANEOUS | Status: AC
  Filled 2019-12-22: qty 5000

## 2019-12-22 MED ORDER — OXYCODONE HCL 5 MG PO TABS: 5.0000 mg | ORAL_TABLET | ORAL | Status: DC | PRN

## 2019-12-22 MED ORDER — CYCLOBENZAPRINE HCL 5 MG PO TABS: 5.0000 mg | ORAL_TABLET | Freq: Every evening | ORAL | Status: DC | PRN

## 2019-12-22 MED ORDER — SODIUM CHLORIDE 0.9% FLUSH: 3.0000 mL | INTRAVENOUS | Status: DC | PRN

## 2019-12-22 MED ORDER — MENTHOL 3 MG MT LOZG: 1.0000 | LOZENGE | OROMUCOSAL | Status: DC | PRN

## 2019-12-22 MED ORDER — TAMOXIFEN CITRATE 10 MG PO TABS
20.0000 mg | ORAL_TABLET | Freq: Every morning | ORAL | Status: DC
  Administered 2019-12-23: 20 mg via ORAL
  Filled 2019-12-22: qty 2

## 2019-12-22 MED ORDER — HYDROMORPHONE HCL 1 MG/ML IJ SOLN: 0.5000 mg | INTRAMUSCULAR | Status: DC | PRN

## 2019-12-22 MED ORDER — ONDANSETRON HCL 4 MG/2ML IJ SOLN
INTRAMUSCULAR | Status: DC | PRN
  Administered 2019-12-22: 4 mg via INTRAVENOUS

## 2019-12-22 MED ORDER — LIDOCAINE-EPINEPHRINE 1 %-1:100000 IJ SOLN
INTRAMUSCULAR | Status: AC
  Filled 2019-12-22: qty 1

## 2019-12-22 MED ORDER — ACETAMINOPHEN 500 MG PO TABS: 1000.0000 mg | ORAL_TABLET | Freq: Every evening | ORAL | Status: DC | PRN

## 2019-12-22 MED ORDER — VITAMIN E 180 MG (400 UNIT) PO CAPS
400.0000 [IU] | ORAL_CAPSULE | Freq: Every day | ORAL | Status: DC
  Administered 2019-12-22 – 2019-12-23 (×2): 400 [IU] via ORAL
  Filled 2019-12-22 (×2): qty 1

## 2019-12-22 MED ORDER — POLYETHYLENE GLYCOL 3350 17 G PO PACK: 17.0000 g | PACK | Freq: Every day | ORAL | Status: DC | PRN

## 2019-12-22 MED ORDER — LIDOCAINE-EPINEPHRINE 1 %-1:100000 IJ SOLN
INTRAMUSCULAR | Status: DC | PRN
  Administered 2019-12-22: 5 mL

## 2019-12-22 MED ORDER — ROCURONIUM BROMIDE 10 MG/ML (PF) SYRINGE
PREFILLED_SYRINGE | INTRAVENOUS | Status: DC | PRN
  Administered 2019-12-22 (×2): 50 mg via INTRAVENOUS

## 2019-12-22 MED ORDER — CHLORHEXIDINE GLUCONATE 0.12 % MT SOLN: 15.0000 mL | Freq: Once | OROMUCOSAL | Status: AC

## 2019-12-22 MED ORDER — PANTOPRAZOLE SODIUM 40 MG PO TBEC
40.0000 mg | DELAYED_RELEASE_TABLET | Freq: Every day | ORAL | Status: DC
  Administered 2019-12-23: 40 mg via ORAL
  Filled 2019-12-22: qty 1

## 2019-12-22 MED ORDER — CHLORHEXIDINE GLUCONATE CLOTH 2 % EX PADS: 6.0000 | MEDICATED_PAD | Freq: Once | CUTANEOUS | Status: DC

## 2019-12-22 MED ORDER — DOCUSATE SODIUM 100 MG PO CAPS
100.0000 mg | ORAL_CAPSULE | Freq: Two times a day (BID) | ORAL | Status: DC
  Administered 2019-12-22 – 2019-12-23 (×3): 100 mg via ORAL
  Filled 2019-12-22 (×3): qty 1

## 2019-12-22 MED ORDER — VANCOMYCIN HCL IN DEXTROSE 1-5 GM/200ML-% IV SOLN
INTRAVENOUS | Status: AC
  Filled 2019-12-22: qty 200

## 2019-12-22 MED ORDER — CYCLOSPORINE 0.05 % OP EMUL
1.0000 [drp] | Freq: Two times a day (BID) | OPHTHALMIC | Status: DC
  Administered 2019-12-22 – 2019-12-23 (×3): 1 [drp] via OPHTHALMIC
  Filled 2019-12-22 (×4): qty 1

## 2019-12-22 MED ORDER — HYDROXYZINE HCL 50 MG/ML IM SOLN: 50.0000 mg | Freq: Four times a day (QID) | INTRAMUSCULAR | Status: DC | PRN

## 2019-12-22 MED ORDER — BISACODYL 10 MG RE SUPP: 10.0000 mg | Freq: Every day | RECTAL | Status: DC | PRN

## 2019-12-22 MED ORDER — 0.9 % SODIUM CHLORIDE (POUR BTL) OPTIME
TOPICAL | Status: DC | PRN
  Administered 2019-12-22: 1000 mL

## 2019-12-22 MED ORDER — LABETALOL HCL 5 MG/ML IV SOLN
INTRAVENOUS | Status: AC
  Filled 2019-12-22: qty 4

## 2019-12-22 MED ORDER — SODIUM CHLORIDE 0.9 % IV SOLN: 250.0000 mL | INTRAVENOUS | Status: DC

## 2019-12-22 MED ORDER — PROPOFOL 10 MG/ML IV BOLUS
INTRAVENOUS | Status: DC | PRN
  Administered 2019-12-22: 30 mg via INTRAVENOUS
  Administered 2019-12-22: 100 mg via INTRAVENOUS

## 2019-12-22 MED ORDER — FLUOCINONIDE 0.05 % EX GEL
1.0000 "application " | Freq: Every day | CUTANEOUS | Status: DC | PRN
  Filled 2019-12-22: qty 15

## 2019-12-22 MED ORDER — ONDANSETRON HCL 4 MG PO TABS: 4.0000 mg | ORAL_TABLET | Freq: Four times a day (QID) | ORAL | Status: DC | PRN

## 2019-12-22 MED ORDER — VANCOMYCIN HCL IN DEXTROSE 1-5 GM/200ML-% IV SOLN
1000.0000 mg | INTRAVENOUS | Status: AC
  Administered 2019-12-22: 1000 mg via INTRAVENOUS

## 2019-12-22 MED ORDER — THROMBIN 5000 UNITS EX SOLR
OROMUCOSAL | Status: DC | PRN
  Administered 2019-12-22 (×2): 5 mL via TOPICAL

## 2019-12-22 MED ORDER — SCOPOLAMINE 1 MG/3DAYS TD PT72
MEDICATED_PATCH | TRANSDERMAL | Status: AC
  Filled 2019-12-22: qty 1

## 2019-12-22 MED ORDER — BUPIVACAINE HCL (PF) 0.5 % IJ SOLN
INTRAMUSCULAR | Status: AC
  Filled 2019-12-22: qty 30

## 2019-12-22 MED ORDER — MIDAZOLAM HCL 2 MG/2ML IJ SOLN
INTRAMUSCULAR | Status: DC | PRN
  Administered 2019-12-22: 2 mg via INTRAVENOUS

## 2019-12-22 MED ORDER — ACETAMINOPHEN 650 MG RE SUPP: 650.0000 mg | RECTAL | Status: DC | PRN

## 2019-12-22 MED ORDER — ONDANSETRON HCL 4 MG/2ML IJ SOLN: 4.0000 mg | Freq: Four times a day (QID) | INTRAMUSCULAR | Status: DC | PRN

## 2019-12-22 MED ORDER — ACETAMINOPHEN 500 MG PO TABS
1000.0000 mg | ORAL_TABLET | Freq: Once | ORAL | Status: AC
  Administered 2019-12-22: 1000 mg via ORAL
  Filled 2019-12-22: qty 2

## 2019-12-22 MED ORDER — FLEET ENEMA 7-19 GM/118ML RE ENEM: 1.0000 | ENEMA | Freq: Once | RECTAL | Status: DC | PRN

## 2019-12-22 MED ORDER — DEXAMETHASONE SODIUM PHOSPHATE 10 MG/ML IJ SOLN
INTRAMUSCULAR | Status: DC | PRN
  Administered 2019-12-22: 10 mg via INTRAVENOUS

## 2019-12-22 MED ORDER — KCL IN DEXTROSE-NACL 20-5-0.45 MEQ/L-%-% IV SOLN: INTRAVENOUS | Status: DC

## 2019-12-22 MED ORDER — ALUM & MAG HYDROXIDE-SIMETH 200-200-20 MG/5ML PO SUSP: 30.0000 mL | Freq: Four times a day (QID) | ORAL | Status: DC | PRN

## 2019-12-22 MED ORDER — METHOCARBAMOL 1000 MG/10ML IJ SOLN
500.0000 mg | Freq: Four times a day (QID) | INTRAVENOUS | Status: DC | PRN
  Filled 2019-12-22: qty 5

## 2019-12-22 MED ORDER — LABETALOL HCL 5 MG/ML IV SOLN
INTRAVENOUS | Status: DC | PRN
  Administered 2019-12-22 (×2): 2.5 mg via INTRAVENOUS

## 2019-12-22 MED ORDER — VANCOMYCIN HCL 1250 MG/250ML IV SOLN
1250.0000 mg | INTRAVENOUS | Status: DC
  Administered 2019-12-23: 1250 mg via INTRAVENOUS
  Filled 2019-12-22: qty 250

## 2019-12-22 MED ORDER — ACETAMINOPHEN 325 MG PO TABS: 650.0000 mg | ORAL_TABLET | ORAL | Status: DC | PRN

## 2019-12-22 MED ORDER — MIDAZOLAM HCL 2 MG/2ML IJ SOLN
INTRAMUSCULAR | Status: AC
  Filled 2019-12-22: qty 2

## 2019-12-22 MED ORDER — PROPOFOL 10 MG/ML IV BOLUS
INTRAVENOUS | Status: AC
  Filled 2019-12-22: qty 20

## 2019-12-22 MED ORDER — METHOCARBAMOL 500 MG PO TABS
500.0000 mg | ORAL_TABLET | Freq: Four times a day (QID) | ORAL | Status: DC | PRN
  Administered 2019-12-22 – 2019-12-23 (×2): 500 mg via ORAL
  Filled 2019-12-22 (×2): qty 1

## 2019-12-22 MED ORDER — LACTATED RINGERS IV SOLN: INTRAVENOUS | Status: DC

## 2019-12-22 MED ORDER — SUGAMMADEX SODIUM 200 MG/2ML IV SOLN
INTRAVENOUS | Status: DC | PRN
  Administered 2019-12-22: 100 mg via INTRAVENOUS

## 2019-12-22 MED ORDER — SCOPOLAMINE 1 MG/3DAYS TD PT72
MEDICATED_PATCH | TRANSDERMAL | Status: DC | PRN
  Administered 2019-12-22: 1 via TRANSDERMAL

## 2019-12-22 MED ORDER — HYDROMORPHONE HCL 1 MG/ML IJ SOLN
0.2500 mg | INTRAMUSCULAR | Status: DC | PRN
  Administered 2019-12-22: 0.25 mg via INTRAVENOUS

## 2019-12-22 MED ORDER — ZOLPIDEM TARTRATE 5 MG PO TABS: 5.0000 mg | ORAL_TABLET | Freq: Every evening | ORAL | Status: DC | PRN

## 2019-12-22 MED ORDER — RISAQUAD PO CAPS
1.0000 | ORAL_CAPSULE | Freq: Every day | ORAL | Status: DC
  Administered 2019-12-22 – 2019-12-23 (×2): 1 via ORAL
  Filled 2019-12-22 (×2): qty 1

## 2019-12-22 MED ORDER — BUPIVACAINE HCL (PF) 0.5 % IJ SOLN
INTRAMUSCULAR | Status: DC | PRN
  Administered 2019-12-22: 5 mL

## 2019-12-22 MED ORDER — OLOPATADINE HCL 0.1 % OP SOLN
1.0000 [drp] | Freq: Every day | OPHTHALMIC | Status: DC | PRN
  Filled 2019-12-22: qty 5

## 2019-12-22 MED ORDER — HYDROCODONE-ACETAMINOPHEN 5-325 MG PO TABS: 1.0000 | ORAL_TABLET | ORAL | Status: DC | PRN

## 2019-12-22 MED ORDER — LIDOCAINE 2% (20 MG/ML) 5 ML SYRINGE
INTRAMUSCULAR | Status: DC | PRN
  Administered 2019-12-22: 60 mg via INTRAVENOUS

## 2019-12-22 MED ORDER — PHENOL 1.4 % MT LIQD: 1.0000 | OROMUCOSAL | Status: DC | PRN

## 2019-12-22 MED ORDER — PANTOPRAZOLE SODIUM 40 MG IV SOLR: 40.0000 mg | Freq: Every day | INTRAVENOUS | Status: DC

## 2019-12-22 MED ORDER — FENTANYL CITRATE (PF) 250 MCG/5ML IJ SOLN
INTRAMUSCULAR | Status: DC | PRN
Start: 2019-12-22 — End: 2019-12-22
  Administered 2019-12-22: 100 ug via INTRAVENOUS
  Administered 2019-12-22 (×6): 50 ug via INTRAVENOUS

## 2019-12-22 MED ORDER — HYDROMORPHONE HCL 1 MG/ML IJ SOLN
INTRAMUSCULAR | Status: AC
  Administered 2019-12-22: 0.25 mg via INTRAVENOUS
  Filled 2019-12-22: qty 1

## 2019-12-22 MED ORDER — ADULT MULTIVITAMIN W/MINERALS CH
1.0000 | ORAL_TABLET | Freq: Every morning | ORAL | Status: DC
  Administered 2019-12-22 – 2019-12-23 (×2): 1 via ORAL
  Filled 2019-12-22 (×2): qty 1

## 2019-12-22 SURGICAL SUPPLY — 72 items
ADH SKN CLS APL DERMABOND .7 (GAUZE/BANDAGES/DRESSINGS) ×1
BAND INSRT 18 STRL LF DISP RB (MISCELLANEOUS) ×2
BAND RUBBER #18 3X1/16 STRL (MISCELLANEOUS) ×6 IMPLANT
BASKET BONE COLLECTION (BASKET) ×2 IMPLANT
BIT DRILL 12X2.5XAVTR (BIT) IMPLANT
BIT DRILL AVIATOR 12 (BIT) ×2
BIT DRILL AVIATOR 12MM (BIT) ×1
BIT DRILL NEURO 2X3.1 SFT TUCH (MISCELLANEOUS) ×1 IMPLANT
BIT DRL 12X2.5XAVTR (BIT) ×1
BLADE ULTRA TIP 2M (BLADE) IMPLANT
BNDG GAUZE ELAST 4 BULKY (GAUZE/BANDAGES/DRESSINGS) ×4 IMPLANT
BUR BARREL STRAIGHT FLUTE 4.0 (BURR) ×5 IMPLANT
CANISTER SUCT 3000ML PPV (MISCELLANEOUS) ×3 IMPLANT
CARTRIDGE OIL MAESTRO DRILL (MISCELLANEOUS) ×1 IMPLANT
COVER MAYO STAND STRL (DRAPES) ×3 IMPLANT
COVER WAND RF STERILE (DRAPES) ×3 IMPLANT
DECANTER SPIKE VIAL GLASS SM (MISCELLANEOUS) ×3 IMPLANT
DERMABOND ADVANCED (GAUZE/BANDAGES/DRESSINGS) ×2
DERMABOND ADVANCED .7 DNX12 (GAUZE/BANDAGES/DRESSINGS) ×1 IMPLANT
DIFFUSER DRILL AIR PNEUMATIC (MISCELLANEOUS) ×3 IMPLANT
DRAIN JACKSON PRT FLT 10 (DRAIN) ×2 IMPLANT
DRAPE HALF SHEET 40X57 (DRAPES) ×2 IMPLANT
DRAPE LAPAROTOMY 100X72 PEDS (DRAPES) ×3 IMPLANT
DRAPE MICROSCOPE LEICA (MISCELLANEOUS) ×3 IMPLANT
DRILL NEURO 2X3.1 SOFT TOUCH (MISCELLANEOUS) ×3
DRSG OPSITE POSTOP 3X4 (GAUZE/BANDAGES/DRESSINGS) ×2 IMPLANT
DURAPREP 6ML APPLICATOR 50/CS (WOUND CARE) ×3 IMPLANT
ELECT COATED BLADE 2.86 ST (ELECTRODE) ×3 IMPLANT
ELECT REM PT RETURN 9FT ADLT (ELECTROSURGICAL) ×3
ELECTRODE REM PT RTRN 9FT ADLT (ELECTROSURGICAL) ×1 IMPLANT
EVACUATOR SILICONE 100CC (DRAIN) ×2 IMPLANT
GAUZE 4X4 16PLY RFD (DISPOSABLE) IMPLANT
GAUZE SPONGE 4X4 12PLY STRL (GAUZE/BANDAGES/DRESSINGS) IMPLANT
GLOVE BIO SURGEON STRL SZ8 (GLOVE) ×5 IMPLANT
GLOVE BIOGEL PI IND STRL 8 (GLOVE) ×1 IMPLANT
GLOVE BIOGEL PI IND STRL 8.5 (GLOVE) ×1 IMPLANT
GLOVE BIOGEL PI INDICATOR 8 (GLOVE) ×4
GLOVE BIOGEL PI INDICATOR 8.5 (GLOVE) ×2
GLOVE ECLIPSE 8.0 STRL XLNG CF (GLOVE) ×3 IMPLANT
GLOVE EXAM NITRILE XL STR (GLOVE) IMPLANT
GOWN STRL REUS W/ TWL LRG LVL3 (GOWN DISPOSABLE) IMPLANT
GOWN STRL REUS W/ TWL XL LVL3 (GOWN DISPOSABLE) ×1 IMPLANT
GOWN STRL REUS W/TWL 2XL LVL3 (GOWN DISPOSABLE) ×3 IMPLANT
GOWN STRL REUS W/TWL LRG LVL3 (GOWN DISPOSABLE)
GOWN STRL REUS W/TWL XL LVL3 (GOWN DISPOSABLE) ×9
HALTER HD/CHIN CERV TRACTION D (MISCELLANEOUS) ×3 IMPLANT
HEMOSTAT POWDER KIT SURGIFOAM (HEMOSTASIS) ×5 IMPLANT
KIT BASIN OR (CUSTOM PROCEDURE TRAY) ×3 IMPLANT
KIT TURNOVER KIT B (KITS) ×3 IMPLANT
NDL HYPO 25X1 1.5 SAFETY (NEEDLE) ×1 IMPLANT
NDL SPNL 18GX3.5 QUINCKE PK (NEEDLE) IMPLANT
NDL SPNL 22GX3.5 QUINCKE BK (NEEDLE) ×1 IMPLANT
NEEDLE HYPO 25X1 1.5 SAFETY (NEEDLE) ×3 IMPLANT
NEEDLE SPNL 18GX3.5 QUINCKE PK (NEEDLE) ×3 IMPLANT
NEEDLE SPNL 22GX3.5 QUINCKE BK (NEEDLE) ×3 IMPLANT
NS IRRIG 1000ML POUR BTL (IV SOLUTION) ×3 IMPLANT
OIL CARTRIDGE MAESTRO DRILL (MISCELLANEOUS) ×3
PACK LAMINECTOMY NEURO (CUSTOM PROCEDURE TRAY) ×3 IMPLANT
PAD ARMBOARD 7.5X6 YLW CONV (MISCELLANEOUS) ×3 IMPLANT
PEEK SPACER AVS AS 6X14X16 4D (Cage) ×2 IMPLANT
PIN DISTRACTION 14MM (PIN) ×8 IMPLANT
PLATE AVIATOR ASSY 3LVL SZ 42 (Plate) ×2 IMPLANT
SCREW AVIATOR VAR SELFTAP 4X12 (Screw) ×16 IMPLANT
SPACER CERV AVS 14X16X5MM 4D (Spacer) ×4 IMPLANT
SPONGE INTESTINAL PEANUT (DISPOSABLE) ×3 IMPLANT
SPONGE SURGIFOAM ABS GEL 100 (HEMOSTASIS) IMPLANT
STAPLER SKIN PROX WIDE 3.9 (STAPLE) IMPLANT
SUT VIC AB 3-0 SH 8-18 (SUTURE) ×3 IMPLANT
TOWEL GREEN STERILE (TOWEL DISPOSABLE) ×3 IMPLANT
TOWEL GREEN STERILE FF (TOWEL DISPOSABLE) ×3 IMPLANT
TRAY FOLEY MTR SLVR 16FR STAT (SET/KITS/TRAYS/PACK) IMPLANT
WATER STERILE IRR 1000ML POUR (IV SOLUTION) ×3 IMPLANT

## 2019-12-22 NOTE — Progress Notes (Addendum)
Pharmacy Antibiotic Note  [name] is a 69 y.o. female admitted on 12/22/2019 for spinal discectomy/fusion/decompression on 12/22/19.   Pharmacy has been consulted for vancomycin dosing for surgical prophylaxis as patient has a drain.  Noted she received vancomycin 1gm around 0800 pre-op.  9/24 labs - SCr 0.87, CrCL 60 ml/min  Plan: Vanc 1250mg  IV Q24H, start tomorrow Monitor renal fxn, clinical progress, vanc trough if indicated  Height: 5\' 4"  (162.6 cm) Weight: 71.4 kg (157 lb 8 oz) IBW/kg (Calculated) : 54.7  Temp (24hrs), Avg:97.5 F (36.4 C), Min:97 F (36.1 C), Max:97.9 F (36.6 C)  Recent Labs  Lab 12/18/19 1114  WBC 6.3  CREATININE 0.87    Estimated Creatinine Clearance: 60 mL/min (by C-G formula based on SCr of 0.87 mg/dL).    Allergies  Allergen Reactions  . Morphine And Related Shortness Of Breath    Causes shallow breathing  . Amoxicillin Hives  . Asa [Aspirin] Other (See Comments)    GI UPSET  . Banana Other (See Comments)    GI UPSET; POSITIVE ON ALLERGY TEST  . Ceftin [Cefuroxime Axetil] Diarrhea  . Citrus Other (See Comments)    **ORANGES**  POSITIVE ON ALLERGY TEST; GI UPSET  . Lactose Other (See Comments)    GI UPSET  . Lactose Intolerance (Gi) Other (See Comments)    GI UPSET  . Orange Oil Diarrhea    POSITIVE ON ALLERGY TEST; GI UPSET  . Penicillins Hives  . Other Other (See Comments)    ORANGES BANANAS LACTOSE INTOLERANCE   . Tape Rash and Other (See Comments)    SKIN REDNESS BANDAIDS     Dajana Gehrig D. Mina Marble, PharmD, BCPS, [place] 12/22/2019, 12:40 PM

## 2019-12-22 NOTE — Op Note (Signed)
12/22/2019  10:17 AM  PATIENT:  [name]  69 y.o. female  PRE-OPERATIVE DIAGNOSIS:  Myelopathy concurrent with and due to spinal stenosis of cervical region, herniated cervical disc, cervical stenosis, cervicalgia, radiculopathy C 45, C 56, C 67 levels  POST-OPERATIVE DIAGNOSIS: Myelopathy concurrent with and due to spinal stenosis of cervical region, herniated cervical disc, cervical stenosis, cervicalgia, radiculopathy C 45, C 56, C 67 levels  PROCEDURE:  Procedure(s): Cervical four-five Cervical five-six Cervical six-seven Anterior cervical decompression/discectomy/fusion (N/A) with PEEK cages, autograft, anterior cervical plate  SURGEON:  Surgeon(s) and Role:    Erline Levine, MD - Primary  PHYSICIAN ASSISTANT: Glenford Peers, NP  ASSISTANTS: [place], RN   ANESTHESIA:   general  EBL:  50 mL   BLOOD ADMINISTERED:none  DRAINS: (10) Jackson-Pratt drain(s) with closed bulb suction in the prevertebral space   LOCAL MEDICATIONS USED:  MARCAINE    and LIDOCAINE   SPECIMEN:  No Specimen  DISPOSITION OF SPECIMEN:  N/A  COUNTS:  YES  TOURNIQUET:  * No tourniquets in log *  DICTATION: Patient was brought to operating room and following the smooth and uncomplicated induction of general endotracheal anesthesia her head was placed on a horseshoe head holder she was placed in 5 pounds of Holter traction and her anterior neck was prepped and draped in usual sterile fashion. An incision was made on the left side of midline after infiltrating the skin and subcutaneous tissues with local lidocaine. The platysmal layer was incised and subplatysmal dissection was performed exposing the anterior border sternocleidomastoid muscle. Using blunt dissection the carotid sheath was kept lateral and trachea and esophagus kept medial exposing the anterior cervical spine. A bent spinal needle was placed it was felt to be the C4-5 level and this was confirmed on intraoperative x-ray. Longus coli muscles  were taken down from the anterior cervical spine using electrocautery and key elevator and self-retaining retractor was placed. The interspaces at C 45, C 56, C 67 were incised and a thorough discectomy was performed. Distraction pins were placed. Large ventral osteophytes were removed and saved for later bone grafting. Initially, the C 45 level was operated.  Uncinate spurs and central spondylitic ridges were drilled down with a high-speed drill. The spinal cord dura and both C 5 nerve roots were widely decompressed. Hemostasis was assured. After trial sizing a 5 mm large footprint lordotic peek interbody cage was selected and packed with local autograft. The graft was tamped into position and countersunk appropriately. The retractor was moved and the interspace at C 5-6 was incised and a thorough discectomy was performed. Distraction pins were placed. Uncinate spurs and central spondylitic ridges were drilled down with a high-speed drill. The spinal cord dura and both C 6 nerve roots were widely decompressed. Hemostasis was assured. After trial sizing a 5 mm large footprint lordotic peek interbody cage was selected and packed with local autograft. The graft was tamped into position and countersunk appropriately.The interspace at C 67 was incised and a thorough discectomy was performed. Distraction pins were placed. Uncinate spurs and central spondylitic ridges were drilled down with a high-speed drill. The spinal cord dura and both C 7 nerve roots were widely decompressed.  Hemostasis was assured.After trial sizing a 6 mm large footprint lordotic peek interbody cage was selected and packed with local autograft. The graft was tamped into position and countersunk appropriately. Distraction weight was removed. A 42 mm Aviator anterior cervical plate was affixed to the cervical spine with 12 mm variable-angle screws  2 at C4, 2 at C5, 2 at C6,  and 2 at C7. All screws were well-positioned and locking mechanisms were  engaged. Soft tissues were inspected and found to be in good repair. The wound was irrigated. A final x-ray was obtained with good visualization at C4 through C 6 with the upper two interbody grafts well visualized. A # 10 JP drain was inserted through a separate stab incision.  The platysma layer was closed with 3-0 Vicryl stitches and the skin was reapproximated with 3-0 Vicryl subcuticular stitches. The wound was dressed with Dermabond and an occlusive dressing. Counts were correct at the end of the case. Patient was extubated and taken to recovery in stable and satisfactory condition.    PLAN OF CARE: Admit for overnight observation  PATIENT DISPOSITION:  PACU - hemodynamically stable.   Delay start of Pharmacological VTE agent (>24hrs) due to surgical blood loss or risk of bleeding: yes

## 2019-12-22 NOTE — Evaluation (Signed)
Physical Therapy Evaluation and Discharge Patient Details Name: [name] MRN: [id number] DOB: 10-23-1950 Today's Date: 12/22/2019   History of Present Illness  [name] is a 69 year old female who presents today for evaluation of neck pain and bilateral arm and leg weakness. She stated that she has a history of degenerative disc disease for the last 16 years as well as lung cancer, breast cancer, and a multinodal goiter. She reported a muscle tear in her right calf in 2020, and her left calf and left meniscus in 2019. Pt underwent ACDF C3-4, C4-5, C5-6, C6-7 on 9/28.  Clinical Impression  Patient evaluated by Physical Therapy with no further acute PT needs identified. All education has been completed and the patient has no further questions. Pt mod I with mobility on eval. Reviewed precautions and proper posture with pt and handout given. Pt ambulated >250' without AD with supervision. No LOB. She also ascended 5 steps with R rail with supervision.  See below for any follow-up Physical Therapy or equipment needs. PT is signing off. Thank you for this referral.     Follow Up Recommendations No PT follow up    Equipment Recommendations  None recommended by PT    Recommendations for Other Services       Precautions / Restrictions Precautions Precautions: Cervical Precaution Booklet Issued: Yes (comment) Precaution Comments: reviewed proper posture and precautions Required Braces or Orthoses: Cervical Brace Cervical Brace: Hard collar;At all times Restrictions Weight Bearing Restrictions: No      Mobility  Bed Mobility Overal bed mobility: Modified Independent             General bed mobility comments: vc's for precautions but pt able to complete log roll and SL to sit and sit to SL without physical assist. Practiced a second time mod I  Transfers Overall transfer level: Modified independent Equipment used: None Transfers: Sit to/from Stand Sit to Stand: Modified  independent (Device/Increase time)         General transfer comment: safe standing from bed without assist  Ambulation/Gait Ambulation/Gait assistance: Supervision Gait Distance (Feet): 225 Feet Assistive device: None Gait Pattern/deviations: WFL(Within Functional Limits) Gait velocity: WFL Gait velocity interpretation: >4.37 ft/sec, indicative of normal walking speed General Gait Details: pt ambulated with good pace and arm swing. vc's for slowing down in general with mobility due to cervical collar and limited downward gaze with this  Stairs Stairs: Yes Stairs assistance: Supervision Stair Management: One rail Right;Alternating pattern;Forwards Number of Stairs: 5 General stair comments: pt safe with stairs and use of R rail  Wheelchair Mobility    Modified Rankin (Stroke Patients Only)       Balance Overall balance assessment: No apparent balance deficits (not formally assessed)                                           Pertinent Vitals/Pain Pain Assessment: 0-10 Pain Score: 6  Pain Location: throat and neck and shoulders Pain Descriptors / Indicators: Sore;Operative site guarding Pain Intervention(s): Limited activity within patient's tolerance;Monitored during session;Premedicated before session    Home Living Family/patient expects to be discharged to:: Private residence Living Arrangements: Spouse/significant other Available Help at Discharge: Family;Available 24 hours/day Type of Home: House Home Access: Stairs to enter     Home Layout: Two level Home Equipment: None Additional Comments: pt lives with her husband    Prior Function Level  of Independence: Independent         Comments: pt walked 3-5 mi/ wk and does gentle yoga     Hand Dominance   Dominant Hand: Right    Extremity/Trunk Assessment   Upper Extremity Assessment Upper Extremity Assessment: Defer to OT evaluation    Lower Extremity Assessment Lower Extremity  Assessment: RLE deficits/detail;LLE deficits/detail RLE Deficits / Details: hip flex >3/5, knee ext 4+/5, knee flex 4+/5, ankle ROM WFL RLE Sensation: WNL RLE Coordination: WNL LLE Deficits / Details: same as RLE LLE Sensation: WNL LLE Coordination: WNL    Cervical / Trunk Assessment Cervical / Trunk Assessment: Other exceptions Cervical / Trunk Exceptions: cervical surgery  Communication   Communication: HOH  Cognition Arousal/Alertness: Awake/alert Behavior During Therapy: WFL for tasks assessed/performed Overall Cognitive Status: Within Functional Limits for tasks assessed                                        General Comments General comments (skin integrity, edema, etc.): discussed home activities in relation to precautions, pt voiced understanding. Pt had questions about showering, deferred to OT    Exercises     Assessment/Plan    PT Assessment Patent does not need any further PT services  PT Problem List         PT Treatment Interventions      PT Goals (Current goals can be found in the Care Plan section)  Acute Rehab PT Goals Patient Stated Goal: return home PT Goal Formulation: With patient    Frequency     Barriers to discharge        Co-evaluation               AM-PAC PT "6 Clicks" Mobility  Outcome Measure Help needed turning from your back to your side while in a flat bed without using bedrails?: None Help needed moving from lying on your back to sitting on the side of a flat bed without using bedrails?: None Help needed moving to and from a bed to a chair (including a wheelchair)?: None Help needed standing up from a chair using your arms (e.g., wheelchair or bedside chair)?: None Help needed to walk in hospital room?: None Help needed climbing 3-5 steps with a railing? : None 6 Click Score: 24    End of Session Equipment Utilized During Treatment: Cervical collar Activity Tolerance: Patient tolerated treatment  well Patient left: in bed;with call bell/phone within reach;with family/visitor present Nurse Communication: Mobility status PT Visit Diagnosis: Pain Pain - part of body:  (neck)    Time: 8295-6213 PT Time Calculation (min) (ACUTE ONLY): 30 min   Charges:   PT Evaluation $PT Eval Low Complexity: 1 Low PT Treatments $Gait Training: 8-22 mins       Leighton Roach, [place]  Pager [phone] Office [place] 12/22/2019, 2:57 PM

## 2019-12-22 NOTE — Transfer of Care (Signed)
Immediate Anesthesia Transfer of Care Note  Patient: [name]  Procedure(s) Performed: Cervical four-five Cervical five-six Cervical six-seven Anterior cervical decompression/discectomy/fusion (N/A )  Patient Location: PACU  Anesthesia Type:General  Level of Consciousness: drowsy and patient cooperative  Airway & Oxygen Therapy: Patient Spontanous Breathing and Patient connected to nasal cannula oxygen  Post-op Assessment: Report given to RN, Post -op Vital signs reviewed and stable and Patient moving all extremities X 4  Post vital signs: Reviewed and stable  Last Vitals:  Vitals Value Taken Time  BP 157/79 12/22/19 1007  Temp    Pulse 91 12/22/19 1014  Resp 21 12/22/19 1014  SpO2 97 % 12/22/19 1014  Vitals shown include unvalidated device data.  Last Pain:  Vitals:   12/22/19 0701  TempSrc:   PainSc: 0-No pain         Complications: No complications documented.

## 2019-12-22 NOTE — Interval H&P Note (Signed)
History and Physical Interval Note:  12/22/2019 7:29 AM  [name]  has presented today for surgery, with the diagnosis of Myelopathy concurrent with and due to spinal stenosis of cervical region.  The various methods of treatment have been discussed with the patient and family. After consideration of risks, benefits and other options for treatment, the patient has consented to  Procedure(s) with comments: Cervical 4-5 Cervical 5-6 Cervical 6-7 Anterior cervical decompression/discectomy/fusion (N/A) - 3C/RM 21 as a surgical intervention.  The patient's history has been reviewed, patient examined, no change in status, stable for surgery.  I have reviewed the patient's chart and labs.  Questions were answered to the patient's satisfaction.     [name]

## 2019-12-22 NOTE — H&P (Signed)
Patient ID:                   [id number] Patient:            [name]               Date of Birth:   02/28/1951 Visit Type:       Office Visit                               Date:    08/05/2019 10:30 AM Provider:          Marchia Meiers. Vertell Limber MD   This 69 year old female presents for neck pain.  HISTORY OF PRESENT ILLNESS: 1.  neck pain  [name] is a 70 year old female who presents today for evaluation of neck pain and bilateral arm and leg weakness. She stated that she has a history of degenerative disc disease for the last 16 years as well as lung cancer, breast cancer, and a multinodal goiter. She reported a muscle tear in her right calf in 2020, and her left calf and left meniscus in 2019. Her neck pain and bilateral arm and leg weakness has been occurring for the last 16 years and includes numbness and tingling in her extremities as well. She further reports that her symptoms have worsened over the last couple of years and now includes intermittent pain and worsening BUE and BLE weakness. Pain is minimal and intermittent and is mostly located in her neck. She reports difficulty with ambulating up stairs and lifting objects with her arms due to her weakness. The weakness is persistent and the severity of her numbness and tingling "happens at random". She denies use of any analgesics and reports occasional use of a muscle relaxer before bed PRN. The muscle relaxer was said to provide minimal relief.   Social: Quit tobacco in 2006. Social ETOH use, 1-2 glasses of red wine 3-4 X week.   cervical MRI demonstrates AP narrowing with a 6.4 mm spinal canal at the C5-6 level, 8.7 mm at C4-5,  8.5 mm at C6-7.  There is also 8.7 mm  diameter at C3-4.  the canal stenosis is most marked at the C5-6 level and the patient has bilateral foraminal narrowing greater at C4-5 than at the C6-7 level.   the patient has been complaining of increasing weakness in her arms and legs and says that she has  been having difficulty going up and down stairs.  It is unclear whether this is from muscle holes in the past  or reflects her cervical spinal cord issues.       PAST MEDICAL/SURGICAL HISTORY:   (Detailed)   Disease/disorder Onset Date Management Date Comments Cancer, unknown  Mastectomy  CRR 08/04/2019 -   Lumpectomy     Oral surgery for cancerous tumor     Appendectomy     Surgery for trigger Finger     Knee surgery ( meniscus & bone fragment removal )     Wisdom tooth removal   Gerd     Cancer, breast    CRR 08/04/2019 - Cancer, lung    CRR 08/04/2019 - Thyroid disease        Family History:  (Detailed) Relationship Family Member Name Deceased Age at Death Condition Onset Age Cause of Death     Family history of Aneurysm of thoracic aorta  N Brother    Aneurysm of thoracic aorta  N Father    Leukemia  N Maternal grandmother    Cancer, gastric  N    Social History:  (Detailed) Tobacco use reviewed. Preferred language is Unknown.   Smoking status: Former smoker.  SMOKING STATUS Type Smoking Status Usage Per Day Years Used Total Pack Years  Former smoker         MEDICATIONS: (added, continued or stopped this visit) Started Medication Directions Instruction Stopped  Acidophilus Probiotic Blend 175 mg capsule take daily    cyclobenzaprine 10 mg tablet take 1 tablet by oral route 3 times every day    esomeprazole magnesium 20 mg capsule,delayed release take 1 capsule by oral route  every day at least 1 hour before a meal swallowing whole. Do not crush or chew granules.    Flonase Allergy Relief 50 mcg/actuation nasal spray,suspension spray 1 - 2 spray by intranasal route  every day in each nostril as needed    multivitamin tablet take 1 tablet by mouth  daily    nabumetone 500 mg tablet take 1 tablet by oral route 2 times every day as needed    Restasis MultiDose 0.05 % eye drops instill 1 drop by ophthalmic route  every 12 hours into affected eye(s)    Systane (PF) 0.4 %-0.3 % eye drops in a dropperette instill 1 drop in each eye    tamoxifen 20 mg tablet take 1 tablet by oral route  every day    vitamin E 400 unit capsule take 1 tablet daily      ALLERGIES: Ingredient Reaction Medication Name Comment ADHESIVE TAPE    AMOXICILLIN TRIHYDRATE  AUGMENTIN  ASPIRIN    PENICILLINS    POTASSIUM CLAVULANATE  AUGMENTIN   Reviewed, updated.    PHYSICAL EXAM:  Vitals Date Temp F BP Pulse Ht In Wt Lb BMI BSA Pain Score 08/05/2019  163/69 88 64 161 27.64  4/10   PHYSICAL EXAM Details General Level of Distress:        no acute distress Overall Appearance:   normal  Head and Face             Right    Left       Fundoscopic Exam:    normal normal    Cardiovascular Cardiac:           regular rate and rhythm without murmur             Right    Left       Carotid Pulses:           normal normal  Respiratory Lungs: clear to auscultation  Neurological Orientation:     normal Recent and Remote Memory: normal Attention Span and Concentration:    normal Language:       normal Fund of Knowledge:    normal             Right    Left Sensation:       normal normal Upper Extremity Coordination:           normal normal  Lower Extremity Coordination:           normal normal  Musculoskeletal Gait and Station:         normal             Right    Left Upper Extremity Muscle Strength:      normal normal Lower Extremity Muscle Strength:      normal normal Upper Extremity Muscle Tone:  normal normal Lower Extremity Muscle Tone:           normal normal   Motor Strength Upper and lower extremity motor strength was tested  in the clinically pertinent muscles. Any abnormal findings will be noted below.              Right    Left Grip:    4/5       4/5 Finger Extensor:         4/5       4/5   Deep Tendon Reflexes             Right    Left Biceps:            normal normal Triceps:           normal normal Brachioradialis:           normal normal Patellar:           normal normal Achilles:           normal normal  Sensory Sensation was tested at C2 to T1.   Cranial Nerves II. Optic Nerve/Visual Fields:  normal III. Oculomotor:           normal IV. Trochlear:  normal V. Trigeminal:  normal VI. Abducens:  normal VII. Facial:       normal VIII. Acoustic/Vestibular:         normal IX. Glossopharyngeal:            normal X. Vagus:        normal XI. Spinal Accessory:  normal XII. Hypoglossal:         normal  Motor and other Tests Lhermittes:      positive Rhomberg:      negative Pronator drift:  absent                           Right    Left Spurlings         negative          negative Hoffman's:       present            present Clonus:            normal normal Babinski:         normal normal SLR:    negative          negative Patrick's Corky Sox):         negative          negative Toe Walk:        normal normal Toe Lift:           normal normal Heel Walk:       normal normal Tinels Elbow:   positive            positive Tinels Wrist:    positive            positive Phalen:            positive            positive   Additional Findings:  Patient has right parascapular pain to palpation and has a palpable goiter on the right which is not terribly large.  Hoffmann sign is more positive on the left than the right    IMPRESSION:   the patient has a cervical myelopathy.  She has significant canal stenosis at the C5-6 level.  She has cord compression at this level.  She also has bilateral foraminal stenosis and relative canal stenosis at C4-5 greater than C6-7 levels.  The patient's  examination is significant for a positive Lhermitte sign and also positive Hoffmann signs bilaterally. she complains of positive Tinel signs at the elbows and at the wrists and has positive Phalen signs suggestive of peripheral entrapment neuropathy of the ulnar and median nerves but her EMG testing was normal.  She has been evaluated by Dr. Janace Hoard for a multinodular goiter and is going to follow up with Dr. Redmond Baseman regarding this concern.  PLAN:  I have recommended to the patient that she undergo anterior cervical decompression and fusion at this C5-6 as well as the C4-5 and C6-7 levels.  She has a combination of spinal stenosis, loss of cervical lordosis, multilevel degeneration of the cervical spine with foraminal stenosis.  She is going to see Dr. Redmond Baseman for evaluation of her goiter and to determine whether this should be treated surgically at the same time as her neck surgery.  She will follow-up after her evaluation ENT surgeon and will make further determinations of timing and the specific nature of the planned surgery.  Risks and benefits of surgery were discussed in detail with the patient and she wishes to proceed.  Patient education was performed.  Orders: Diagnostic Procedures: Assessment Procedure M48.02 Cervical Spine- Lateral Instruction(s)/Education: Assessment Instruction R03.0 Lifestyle education Z68.27 Dietary management education, guidance, and counseling Miscellaneous: Assessment  M48.02 Hard Cervical Collar  Completed Orders (this encounter) Order Details Reason Side Interpretation Result Initial Treatment Date Region Lifestyle education Patient will monitor and contact primary care physician if needed.       Dietary management education, guidance, and counseling Encouraged patient to eat well balanced diet.        Assessment/Plan  # Detail Type Description  1. Assessment Myelopathy concurrent with and due to spinal stenosis  of cervical region (M48.02).  Plan Orders Hard Cervical Collar.     2. Assessment Elevated blood-pressure reading, w/o diagnosis of htn (R03.0).     3. Assessment Body mass index (BMI) 27.0-27.9, adult (S56.81).  Plan Orders Today's instructions / counseling include(s) Dietary management education, guidance, and counseling. Clinical information/comments: Encouraged patient to eat well balanced diet.       Pain Management Plan Pain Scale: 4/10. Method: Numeric Pain Intensity Scale. Location: neck. Onset: 08/05/2019. Duration: varies. Quality: discomforting. Pain management follow-up plan of care: Patient will continue medication management..              Provider:  Marchia Meiers. Vertell Limber MD  08/05/2019 05:36 PM    Dictation edited by: Marchia Meiers. Vertell Limber    CC Providers: [facility] [place],  [place]  [id number]   Antonia Ahern  [address] [place], [place] [address]               Electronically signed by Marchia Meiers. Vertell Limber MD on 08/05/2019 05:36 PM         Note Details  Author Chase Caller, RN File Time 12/22/2019 7:48 AM  Author Type Registered Nurse Status Incomplete  Last Editor Chase Caller, [place] # [phone] Admit Date 12/22/2019

## 2019-12-22 NOTE — Brief Op Note (Signed)
12/22/2019  10:17 AM  PATIENT:  [name]  69 y.o. female  PRE-OPERATIVE DIAGNOSIS:  Myelopathy concurrent with and due to spinal stenosis of cervical region, herniated cervical disc, cervical stenosis, cervicalgia, radiculopathy C 45, C 56, C 67 levels  POST-OPERATIVE DIAGNOSIS: Myelopathy concurrent with and due to spinal stenosis of cervical region, herniated cervical disc, cervical stenosis, cervicalgia, radiculopathy C 45, C 56, C 67 levels  PROCEDURE:  Procedure(s): Cervical four-five Cervical five-six Cervical six-seven Anterior cervical decompression/discectomy/fusion (N/A) with PEEK cages, autograft, anterior cervical plate  SURGEON:  Surgeon(s) and Role:    Erline Levine, MD - Primary  PHYSICIAN ASSISTANT: Glenford Peers, NP  ASSISTANTS: [place], RN   ANESTHESIA:   general  EBL:  50 mL   BLOOD ADMINISTERED:none  DRAINS: (10) Jackson-Pratt drain(s) with closed bulb suction in the prevertebral space   LOCAL MEDICATIONS USED:  MARCAINE    and LIDOCAINE   SPECIMEN:  No Specimen  DISPOSITION OF SPECIMEN:  N/A  COUNTS:  YES  TOURNIQUET:  * No tourniquets in log *  DICTATION: Patient was brought to operating room and following the smooth and uncomplicated induction of general endotracheal anesthesia her head was placed on a horseshoe head holder she was placed in 5 pounds of Holter traction and her anterior neck was prepped and draped in usual sterile fashion. An incision was made on the left side of midline after infiltrating the skin and subcutaneous tissues with local lidocaine. The platysmal layer was incised and subplatysmal dissection was performed exposing the anterior border sternocleidomastoid muscle. Using blunt dissection the carotid sheath was kept lateral and trachea and esophagus kept medial exposing the anterior cervical spine. A bent spinal needle was placed it was felt to be the C4-5 level and this was confirmed on intraoperative x-ray. Longus coli muscles  were taken down from the anterior cervical spine using electrocautery and key elevator and self-retaining retractor was placed. The interspaces at C 45, C 56, C 67 were incised and a thorough discectomy was performed. Distraction pins were placed. Large ventral osteophytes were removed and saved for later bone grafting. Initially, the C 45 level was operated.  Uncinate spurs and central spondylitic ridges were drilled down with a high-speed drill. The spinal cord dura and both C 5 nerve roots were widely decompressed. Hemostasis was assured. After trial sizing a 5 mm large footprint lordotic peek interbody cage was selected and packed with local autograft. The graft was tamped into position and countersunk appropriately. The retractor was moved and the interspace at C 5-6 was incised and a thorough discectomy was performed. Distraction pins were placed. Uncinate spurs and central spondylitic ridges were drilled down with a high-speed drill. The spinal cord dura and both C 6 nerve roots were widely decompressed. Hemostasis was assured. After trial sizing a 5 mm large footprint lordotic peek interbody cage was selected and packed with local autograft. The graft was tamped into position and countersunk appropriately.The interspace at C 67 was incised and a thorough discectomy was performed. Distraction pins were placed. Uncinate spurs and central spondylitic ridges were drilled down with a high-speed drill. The spinal cord dura and both C 7 nerve roots were widely decompressed.  Hemostasis was assured.After trial sizing a 6 mm large footprint lordotic peek interbody cage was selected and packed with local autograft. The graft was tamped into position and countersunk appropriately. Distraction weight was removed. A 42 mm Aviator anterior cervical plate was affixed to the cervical spine with 12 mm variable-angle screws  2 at C4, 2 at C5, 2 at C6,  and 2 at C7. All screws were well-positioned and locking mechanisms were  engaged. Soft tissues were inspected and found to be in good repair. The wound was irrigated. A final x-ray was obtained with good visualization at C4 through C 6 with the upper two interbody grafts well visualized. A # 10 JP drain was inserted through a separate stab incision.  The platysma layer was closed with 3-0 Vicryl stitches and the skin was reapproximated with 3-0 Vicryl subcuticular stitches. The wound was dressed with Dermabond and an occlusive dressing. Counts were correct at the end of the case. Patient was extubated and taken to recovery in stable and satisfactory condition.    PLAN OF CARE: Admit for overnight observation  PATIENT DISPOSITION:  PACU - hemodynamically stable.   Delay start of Pharmacological VTE agent (>24hrs) due to surgical blood loss or risk of bleeding: yes

## 2019-12-22 NOTE — Anesthesia Procedure Notes (Signed)
Procedure Name: Intubation Date/Time: 12/22/2019 7:45 AM Performed by: Darletta Moll, CRNA Pre-anesthesia Checklist: Patient identified, Emergency Drugs available, Suction available and Patient being monitored Patient Re-evaluated:Patient Re-evaluated prior to induction Oxygen Delivery Method: Circle system utilized Preoxygenation: Pre-oxygenation with 100% oxygen Induction Type: IV induction Ventilation: Mask ventilation without difficulty and Oral airway inserted - appropriate to patient size Laryngoscope Size: Glidescope and 3 Tube type: Oral Tube size: 7.0 mm Number of attempts: 1 Airway Equipment and Method: Video-laryngoscopy and Rigid stylet Placement Confirmation: ETT inserted through vocal cords under direct vision,  positive ETCO2 and breath sounds checked- equal and bilateral Secured at: 21 cm Tube secured with: Tape Dental Injury: Teeth and Oropharynx as per pre-operative assessment  Comments: Neutral position for neck was maintained throughout intubation

## 2019-12-22 NOTE — OR Nursing (Signed)
@  0750 Surgeon made aware of patient allergy to bananas and that patient denies latex allergy stated may use his gloves

## 2019-12-23 ENCOUNTER — Encounter (HOSPITAL_COMMUNITY): Payer: Self-pay | Admitting: Neurosurgery

## 2019-12-23 DIAGNOSIS — M50222 Other cervical disc displacement at C5-C6 level: Secondary | ICD-10-CM | POA: Diagnosis not present

## 2019-12-23 DIAGNOSIS — M5412 Radiculopathy, cervical region: Secondary | ICD-10-CM | POA: Diagnosis not present

## 2019-12-23 DIAGNOSIS — M4802 Spinal stenosis, cervical region: Secondary | ICD-10-CM | POA: Diagnosis not present

## 2019-12-23 MED FILL — Thrombin For Soln 5000 Unit: CUTANEOUS | Qty: 5000 | Status: AC

## 2019-12-23 NOTE — Discharge Instructions (Signed)
Wound Care Leave incision open to air. You may shower. Do not scrub directly on incision.  Do not put any creams, lotions, or ointments on incision. Activity Walk each and every day, increasing distance each day. No lifting greater than 5 lbs.  Avoid excessive neck motion. No driving for 2 weeks; may ride as a passenger locally. Wear neck brace at all times except when showering.  If provided soft collar, may wear for comfort unless otherwise instructed. Diet Resume your normal diet.  Return to Work Will be discussed at you follow up appointment. Call Your Doctor If Any of These Occur Redness, drainage, or swelling at the wound.  Temperature greater than 101 degrees. Severe pain not relieved by pain medication. Increased difficulty swallowing. Incision starts to come apart. Follow Up Appt Call today for appointment in 2-4 weeks ([phone]) or for problems.  If you have any hardware placed in your spine, you will need an x-ray before your appointment.

## 2019-12-23 NOTE — Discharge Summary (Signed)
Physician Discharge Summary  Patient ID: [name] MRN: [id number] DOB/AGE: 68/14/1952 68 y.o.  Admit date: 12/22/2019 Discharge date: 12/23/2019  Admission Diagnoses: Myelopathy concurrent with and due to spinal stenosis of cervical region, herniated cervical disc, cervical stenosis, cervicalgia, radiculopathy C 45, C 56, C 67 levels  Discharge Diagnoses: Myelopathy concurrent with and due to spinal stenosis of cervical region, herniated cervical disc, cervical stenosis, cervicalgia, radiculopathy C 45, C 56, C 67 levels Active Problems:   Cervical myelopathy ([place])   Discharged Condition: good  Hospital Course: The patient was admitted on 12/22/2019 and taken to the operating room where the patient underwent C4-5, C5-6, and C6-7 anterior cervical decompression/diskectomy/fusion with peek cages, autograft, and anterior cervical plate. The patient tolerated the procedure well and was taken to the recovery room and then to the floor in stable condition. The hospital course was routine. There were no complications. The wound remained clean dry and intact. Pt had appropriate neck soreness. No complaints of arm/leg or new N/T/W. The patient remained afebrile with stable vital signs, and tolerated a regular diet. The patient continued to increase activities, and pain was well controlled with oral pain medications.   Consults: None  Significant Diagnostic Studies: radiology: X-Ray  Treatments: surgery: C4-5, C5-6, and C6-7 anterior cervical decompression/diskectomy/fusion with peek cages, autograft, and anterior cervical plate  Discharge Exam: Blood pressure (!) 146/80, pulse 74, temperature 98.4 F (36.9 C), temperature source Oral, resp. rate 16, height 5\' 4"  (1.626 m), weight 71.4 kg, SpO2 98 %.  Awake,A/O X 4, and conversant. Patient is doing welloverall and is in good spirits. MAEW with good strength that is symmetric bilaterally. Dressing is clean dry intact. Incision is well  approximated with no drainage, erythema, or edema.  Disposition: Discharge disposition: 01-Home or Self Care        Allergies as of 12/23/2019      Reactions   Morphine And Related Shortness Of Breath   Causes shallow breathing   Amoxicillin Hives   Asa [aspirin] Other (See Comments)   GI UPSET   Banana Other (See Comments)   GI UPSET; POSITIVE ON ALLERGY TEST   Ceftin [cefuroxime Axetil] Diarrhea   Citrus Other (See Comments)   **ORANGES** POSITIVE ON ALLERGY TEST; GI UPSET   Lactose Other (See Comments)   GI UPSET   Lactose Intolerance (gi) Other (See Comments)   GI UPSET   Orange Oil Diarrhea   POSITIVE ON ALLERGY TEST; GI UPSET   Penicillins Hives   Other Other (See Comments)   ORANGES BANANAS LACTOSE INTOLERANCE   Tape Rash, Other (See Comments)   SKIN REDNESS BANDAIDS      Medication List    STOP taking these medications   ibuprofen 600 MG tablet Commonly known as: ADVIL   nabumetone 500 MG tablet Commonly known as: RELAFEN     TAKE these medications   acetaminophen 500 MG tablet Commonly known as: TYLENOL Take 1,000 mg by mouth at bedtime as needed for moderate pain.   Acidophilus 0.5 MG Tabs Take 0.5 mg by mouth daily.   ACIDOPHILUS PO Take 5 mg by mouth daily.   cyclobenzaprine 10 MG tablet Commonly known as: FLEXERIL Take 5-10 mg by mouth at bedtime as needed for muscle spasms.   cycloSPORINE 0.05 % ophthalmic emulsion Commonly known as: RESTASIS Place 1 drop into both eyes 2 (two) times daily.   esomeprazole 20 MG capsule Commonly known as: NEXIUM Take 20 mg by mouth daily at 12 noon.   fluocinonide  gel 0.05 % Commonly known as: LIDEX Apply 1 application topically daily as needed (canker sores).   fluticasone 50 MCG/ACT nasal spray Commonly known as: FLONASE Place 2 sprays into both nostrils daily.   multivitamin capsule Take 1 capsule by mouth in the morning.   Pataday 0.1 % ophthalmic solution Generic drug:  olopatadine Place 1 drop into both eyes daily as needed for allergies.   SYSTANE OP Place 1 drop into both eyes daily as needed (dry eyes).   tamoxifen 20 MG tablet Commonly known as: NOLVADEX TAKE 1 TABLET BY MOUTH EVERY DAY What changed: when to take this   vitamin E 180 MG (400 UNITS) capsule Take 400 Units by mouth daily.        Signed: Marvis Moeller, DNP, NP-C 12/23/2019, 8:56 AM

## 2019-12-23 NOTE — Anesthesia Postprocedure Evaluation (Signed)
Anesthesia Post Note  Patient: KENADI MILTNER  Procedure(s) Performed: Cervical four-five Cervical five-six Cervical six-seven Anterior cervical decompression/discectomy/fusion (N/A )     Patient location during evaluation: Other Anesthesia Type: General Level of consciousness: awake and alert Pain management: pain level controlled Vital Signs Assessment: post-procedure vital signs reviewed and stable Respiratory status: spontaneous breathing, nonlabored ventilation and respiratory function stable Cardiovascular status: blood pressure returned to baseline and stable Postop Assessment: no apparent nausea or vomiting Anesthetic complications: no   No complications documented.  Last Vitals:  Vitals:   12/23/19 0350 12/23/19 0822  BP: (!) 141/66 (!) 146/80  Pulse: 79 74  Resp: 18 16  Temp: 36.8 C 36.9 C  SpO2: 97% 98%    Last Pain:  Vitals:   12/23/19 0822  TempSrc: Oral  PainSc:                  Loucinda Croy,W. EDMOND

## 2019-12-23 NOTE — Progress Notes (Signed)
Subjective: Patient reports that "I am doing really well." She reports a resolution of her preoperative paraesthesia and arm weakness. She does report some mild incisional pain and discomfort in her neck and shoulders. She has ambulated multiple times in the hallway and in her room. No acute events reported overnight by the patient or RN.   Objective: Vital signs in last 24 hours: Temp:  [97 F (36.1 C)-98.5 F (36.9 C)] 98.4 F (36.9 C) (09/29 0822) Pulse Rate:  [70-93] 74 (09/29 0822) Resp:  [10-20] 16 (09/29 0822) BP: (133-173)/(62-92) 146/80 (09/29 0822) SpO2:  [93 %-99 %] 98 % (09/29 0822)  Intake/Output from previous day: 09/28 0701 - 09/29 0700 In: 1450 [I.V.:1250; IV Piggyback:200] Out: 105 [Drains:55; Blood:50] Intake/Output this shift: No intake/output data recorded.  Physical Exam:  Awake, A/O X 4, and conversant. Patient is doing well overall and is in good spirits. MAEW with good strength that is symmetric bilaterally.  Dressing is clean dry intact.  Incision is well approximated with no drainage, erythema, or edema. Drain with minimal sanguinous fluid.  Overnight output reported to be 20 mL.   Lab Results: No results for input(s): WBC, HGB, HCT, PLT in the last 72 hours. BMET No results for input(s): NA, K, CL, CO2, GLUCOSE, BUN, CREATININE, CALCIUM in the last 72 hours.  Studies/Results: DG Cervical Spine 2-3 Views  Result Date: 12/22/2019 CLINICAL DATA:  Cervical fusion EXAM: CERVICAL SPINE - 2-3 VIEW COMPARISON:  06/16/2019 FINDINGS: Two intraoperative films were obtained. The initial film demonstrates a curved needle in the anterior aspect of the C4-5 disc space. Subsequent image shows cervical fusion from C4-C7 with anterior fixation. No soft tissue abnormality is noted. IMPRESSION: Cervical fusion from C4-C7. Electronically Signed   By: Inez Catalina M.D.   On: 12/22/2019 09:56    Assessment/Plan: Patient is post-op day 1 s/p C4-5, C5-6, and C6-7 anterior  cervical decompression/diskectomy/fusion with peek cages, autograft, and anterior cervical plate. She is recovering well and reports a resolution of her preoperative symptoms.  Her only complaint is mild incisional, shoulder and upper back discomfort.  She has ambulated with PT w and is awaiting occupational therapy evaluation.  Continue hard cervical collar.  Drain will be removed this morning.  Continue working on pain control, mobility and ambulating patient. Will plan for discharge today.    LOS: 0 days     Marvis Moeller, DNP, NP-C 12/23/2019, 8:44 AM

## 2019-12-23 NOTE — Progress Notes (Signed)
Patient is discharged from room 3C11 at this time. Alert and in stable condition. IV site d/c'd and instructions read to patient and spouse with understanding verbalized. Left unit via wheelchair with all belongings at side. 

## 2019-12-23 NOTE — Evaluation (Signed)
Occupational Therapy Evaluation Patient Details Name: [name] MRN: [id number] DOB: 1950/07/14 Today's Date: 12/23/2019    History of Present Illness [name] is a 69 year old female who presents today for evaluation of neck pain and bilateral arm and leg weakness. She stated that she has a history of degenerative disc disease for the last 16 years as well as lung cancer, breast cancer, and a multinodal goiter. She reported a muscle tear in her right calf in 2020, and her left calf and left meniscus in 2019. Pt underwent ACDF C3-4, C4-5, C5-6, C6-7 on 9/28.   Clinical Impression   Pt seen this date for OT evaluation, currently with good safety and insight to limitations and restrictions. Tolerated full ADL/self care session in sitting/standing as indicated at distant Sup-mod indep level of performance. Pt able to verbalize precautions/restrictions and safe techniques throughout session and at this time demos safe level of function to d/c to home with S/A as needed from husband. Please see recommendations listed below, no acute OT indicated, OT will sign off at this time.     Follow Up Recommendations  No OT follow up    Equipment Recommendations  None recommended by OT    Recommendations for Other Services       Precautions / Restrictions Precautions Precautions: Cervical Precaution Booklet Issued: Yes (comment) Precaution Comments: reviewed donn/doff of collar, precautions and posture Required Braces or Orthoses: Cervical Brace Cervical Brace: Hard collar;At all times (remove for showering/toileting) Restrictions Weight Bearing Restrictions: No      Mobility Bed Mobility Overal bed mobility: Modified Independent             General bed mobility comments: VC for sequencing and precautions maintenance with supine<>sitting and STS   Transfers Overall transfer level: Modified independent Equipment used: None Transfers: Sit to/from Stand Sit to Stand: Modified  independent (Device/Increase time)     Balance Overall balance assessment: No apparent balance deficits (not formally assessed)    ADL either performed or assessed with clinical judgement   ADL Overall ADL's : At baseline General ADL Comments: pt benefited from cuing for safe completion of ADL's throughout assessment, good safety with full ADL routine this date after educaiton  OT reviewed hand out in room, regarding donn/doff cervical collar, positioning, modifications to daily activities. Medication and setting alarms for overnight. Use of DME to maximize safety and tolerance for ADL completion. Technique for safe self care transfers including shower/tub and bed.       Vision Baseline Vision/History: No visual deficits      Perception Perception Perception Tested?: No   Praxis Praxis Praxis tested?: Within functional limits    Pertinent Vitals/Pain Pain Assessment: 0-10 Pain Score: 3  Pain Location: shoulders/neck/throat Pain Descriptors / Indicators: Operative site guarding Pain Intervention(s): Limited activity within patient's tolerance     Hand Dominance Right   Extremity/Trunk Assessment Upper Extremity Assessment Upper Extremity Assessment: Overall WFL for tasks assessed (limited full assessment d/t cervical restrictions)           Communication Communication Communication: HOH   Cognition Arousal/Alertness: Awake/alert Behavior During Therapy: WFL for tasks assessed/performed Overall Cognitive Status: Within Functional Limits for tasks assessed                                     General Comments       Exercises     Shoulder Instructions  Home Living Family/patient expects to be discharged to:: Private residence Living Arrangements: Spouse/significant other Available Help at Discharge: Family;Available 24 hours/day Type of Home: House Home Access: Stairs to enter (3STE)     Home Layout: Two level Alternate Level  Stairs-Number of Steps: flight Alternate Level Stairs-Rails: Right Bathroom Shower/Tub: Walk-in shower;Tub/shower unit   Bathroom Toilet: Standard     Home Equipment: None          Prior Functioning/Environment Level of Independence: Independent        Comments: active in exercise and housework tasks         OT Problem List: Decreased knowledge of use of DME or AE;Decreased knowledge of precautions;Decreased activity tolerance      OT Treatment/Interventions:      OT Goals(Current goals can be found in the care plan section) Acute Rehab OT Goals Patient Stated Goal: Go home today  OT Frequency:     Barriers to D/C:            Co-evaluation              AM-PAC OT "6 Clicks" Daily Activity     Outcome Measure Help from another person eating meals?: None Help from another person taking care of personal grooming?: None Help from another person toileting, which includes using toliet, bedpan, or urinal?: None Help from another person bathing (including washing, rinsing, drying)?: A Little Help from another person to put on and taking off regular upper body clothing?: A Little Help from another person to put on and taking off regular lower body clothing?: None 6 Click Score: 22   End of Session    Activity Tolerance: Patient tolerated treatment well Patient left: in bed;with family/visitor present  OT Visit Diagnosis: Unsteadiness on feet (R26.81)                Time: 1308-6578 OT Time Calculation (min): 30 min Charges:  OT Evaluation $OT Eval Moderate Complexity: 1 Mod OT Treatments $Self Care/Home Management : 8-22 mins  [name] OTR/L acute rehab services Office: [place] 12/23/2019, 9:57 AM

## 2020-01-08 ENCOUNTER — Other Ambulatory Visit: Payer: Self-pay | Admitting: Internal Medicine

## 2020-01-08 DIAGNOSIS — D0511 Intraductal carcinoma in situ of right breast: Secondary | ICD-10-CM

## 2020-01-14 DIAGNOSIS — M50222 Other cervical disc displacement at C5-C6 level: Secondary | ICD-10-CM | POA: Diagnosis not present

## 2020-01-14 DIAGNOSIS — M5412 Radiculopathy, cervical region: Secondary | ICD-10-CM | POA: Diagnosis not present

## 2020-01-14 DIAGNOSIS — M4802 Spinal stenosis, cervical region: Secondary | ICD-10-CM | POA: Diagnosis not present

## 2020-01-17 DIAGNOSIS — Z23 Encounter for immunization: Secondary | ICD-10-CM | POA: Diagnosis not present

## 2020-02-03 DIAGNOSIS — H2513 Age-related nuclear cataract, bilateral: Secondary | ICD-10-CM | POA: Diagnosis not present

## 2020-03-03 DIAGNOSIS — C50111 Malignant neoplasm of central portion of right female breast: Secondary | ICD-10-CM | POA: Diagnosis not present

## 2020-03-03 DIAGNOSIS — Z85118 Personal history of other malignant neoplasm of bronchus and lung: Secondary | ICD-10-CM | POA: Diagnosis not present

## 2020-03-03 DIAGNOSIS — E042 Nontoxic multinodular goiter: Secondary | ICD-10-CM | POA: Diagnosis not present

## 2020-03-03 DIAGNOSIS — M545 Low back pain, unspecified: Secondary | ICD-10-CM | POA: Diagnosis not present

## 2020-03-03 DIAGNOSIS — K219 Gastro-esophageal reflux disease without esophagitis: Secondary | ICD-10-CM | POA: Diagnosis not present

## 2020-03-03 DIAGNOSIS — E7800 Pure hypercholesterolemia, unspecified: Secondary | ICD-10-CM | POA: Diagnosis not present

## 2020-04-25 ENCOUNTER — Other Ambulatory Visit: Payer: Self-pay | Admitting: Family Medicine

## 2020-04-25 DIAGNOSIS — K8020 Calculus of gallbladder without cholecystitis without obstruction: Secondary | ICD-10-CM

## 2020-04-26 ENCOUNTER — Ambulatory Visit
Admission: RE | Admit: 2020-04-26 | Discharge: 2020-04-26 | Disposition: A | Discharge status: Home / Self Care | Payer: Medicare Other | Source: Ambulatory Visit | Attending: Family Medicine | Admitting: Family Medicine

## 2020-04-26 DIAGNOSIS — K8020 Calculus of gallbladder without cholecystitis without obstruction: Secondary | ICD-10-CM

## 2020-04-26 IMAGING — US US ABDOMEN LIMITED
1 series · 14 of 25 positions shown · non-contrast
Comparison: Ultrasound [DATE].

CLINICAL DATA: Gas.  Bloating.

EXAM:
ULTRASOUND ABDOMEN LIMITED RIGHT UPPER QUADRANT

[Series 1: us abdomen limited · 0.19mm/px · 14 of 48 slices shown]
[im 1/48]
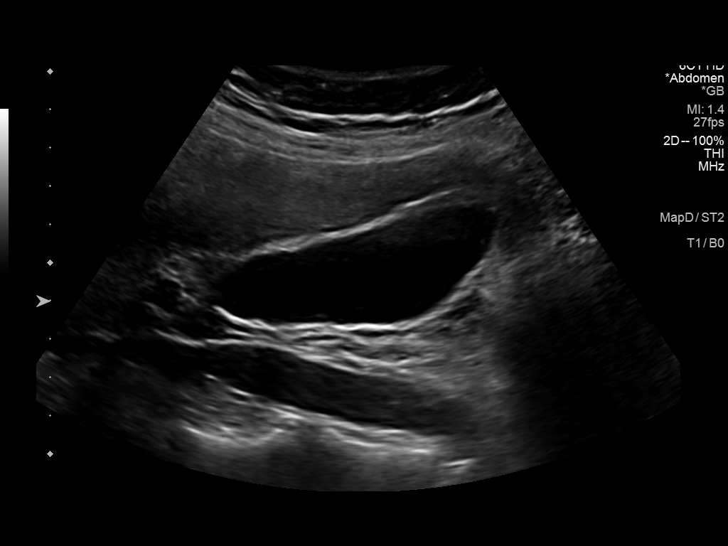
[im 4/48]
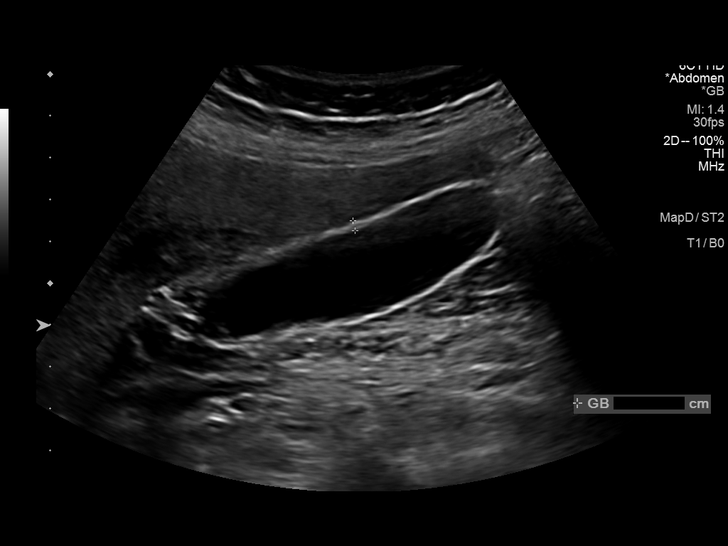
[im 8/48]
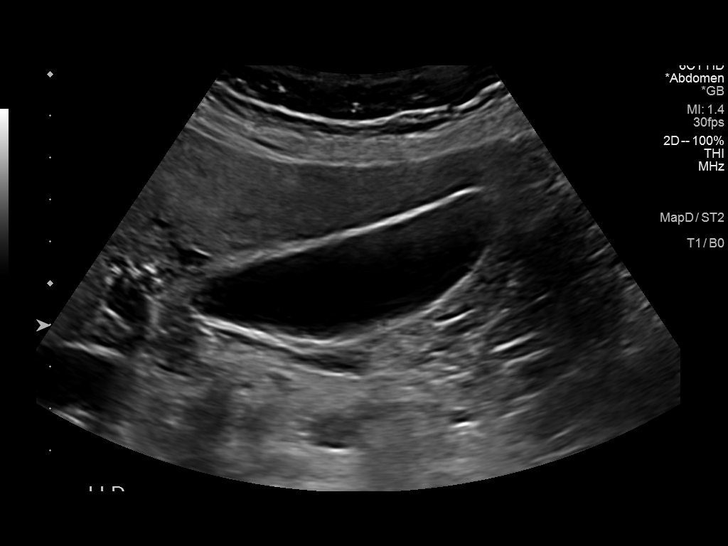
[im 12/48]
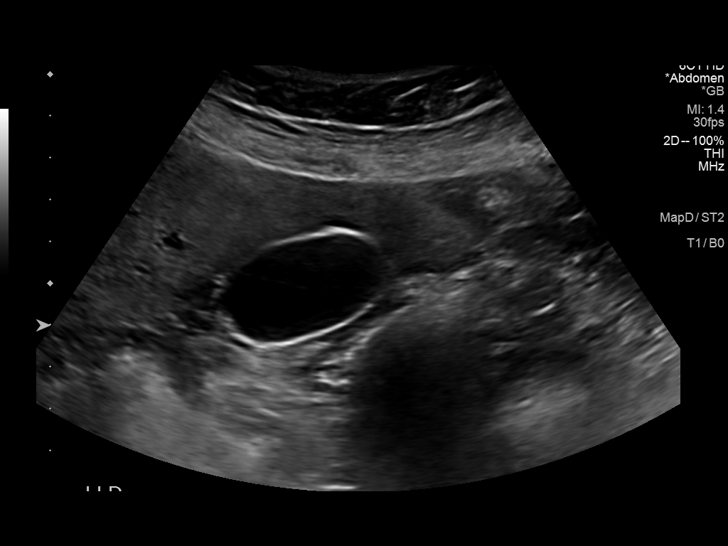
[im 16/48]
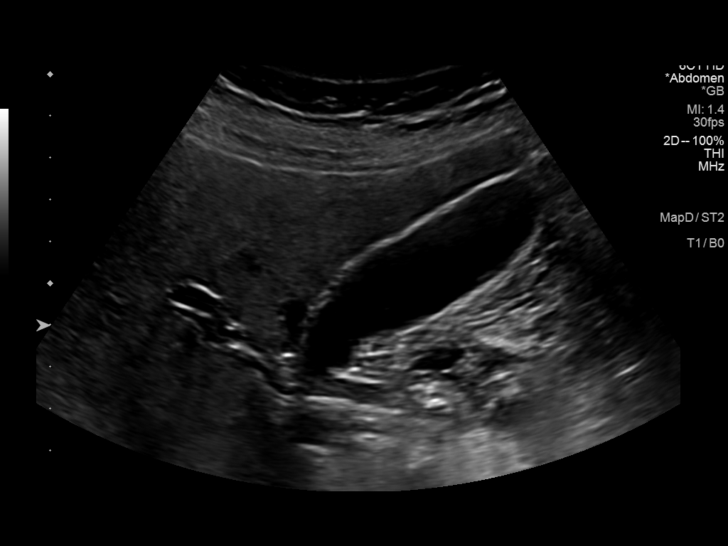
[im 18/48]
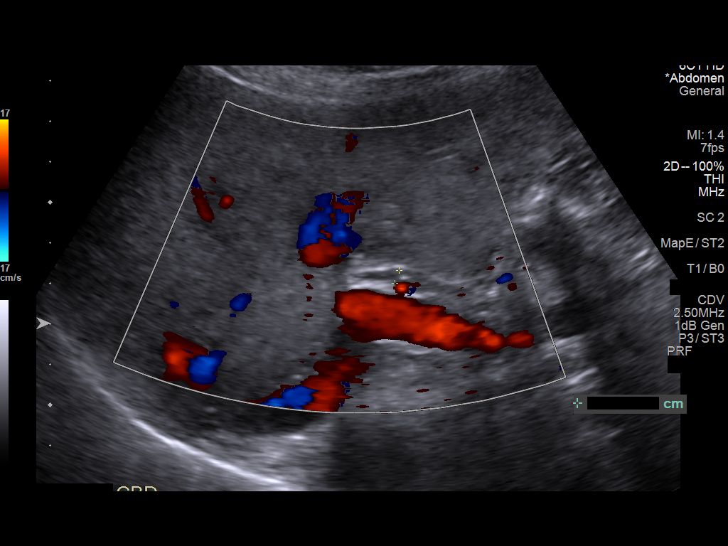
[im 22/48]
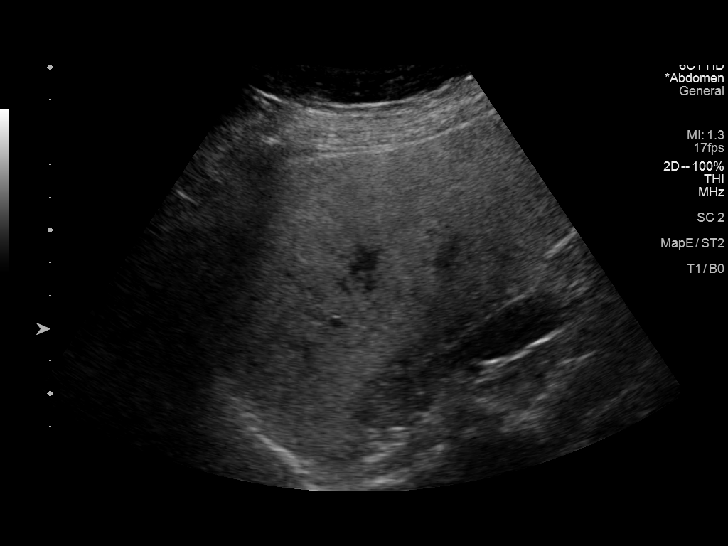
[im 26/48]
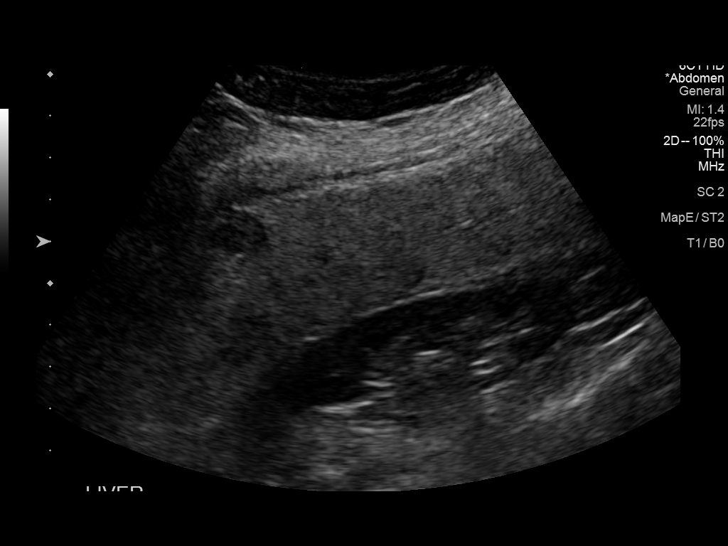
[im 30/48]
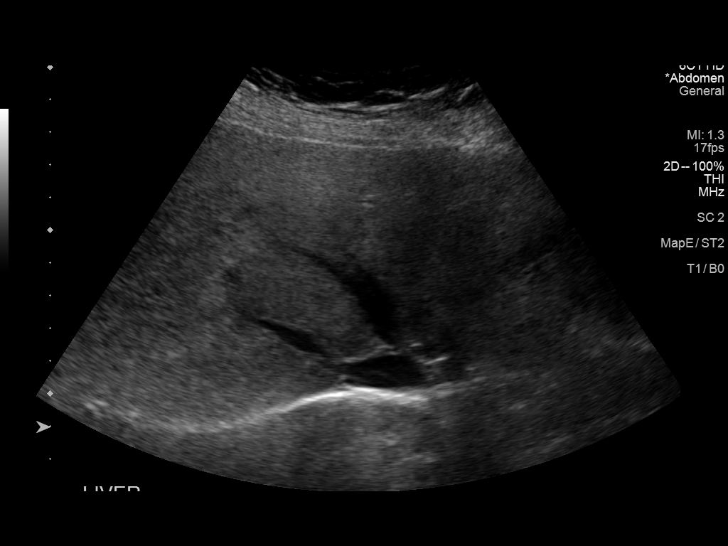
[im 32/48]
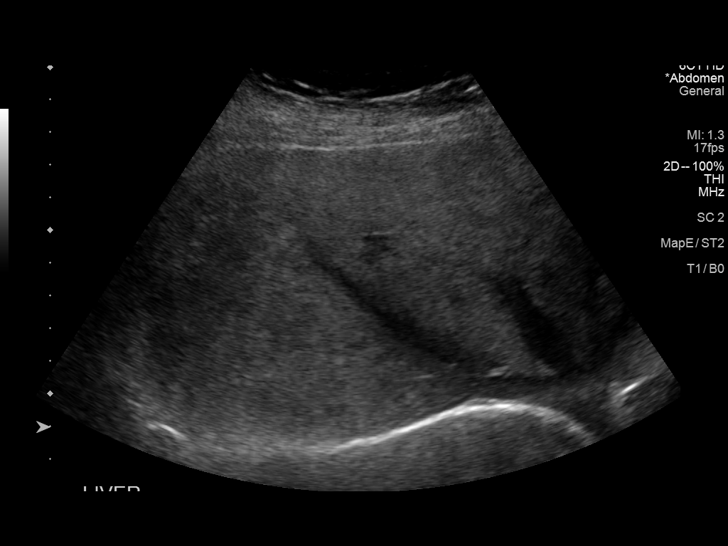
[im 36/48]
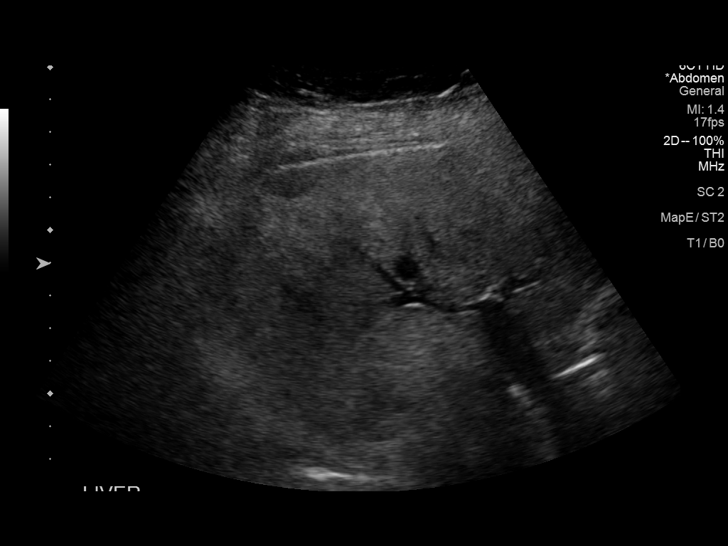
[im 40/48]
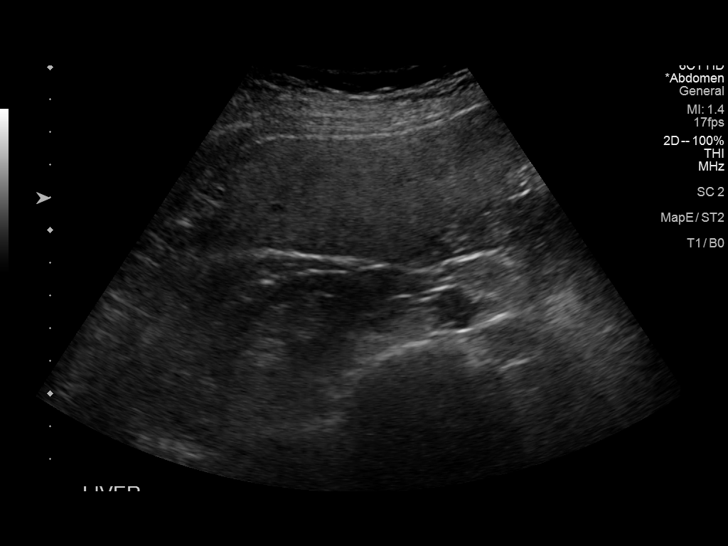
[im 44/48]
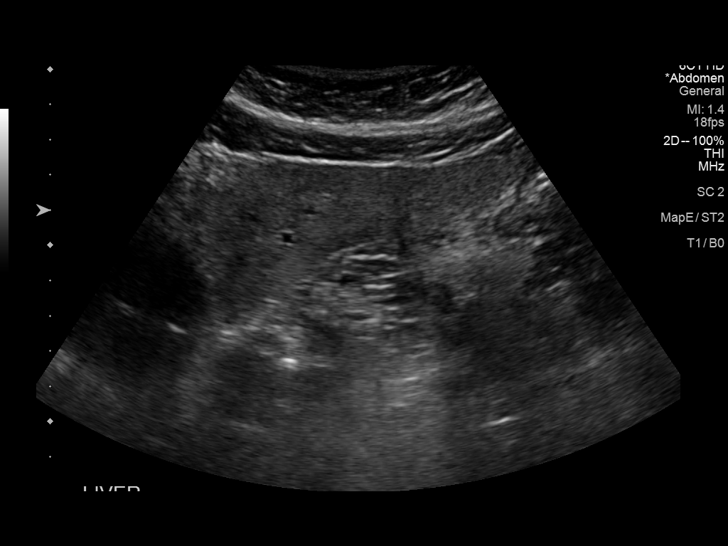
[im 48/48]
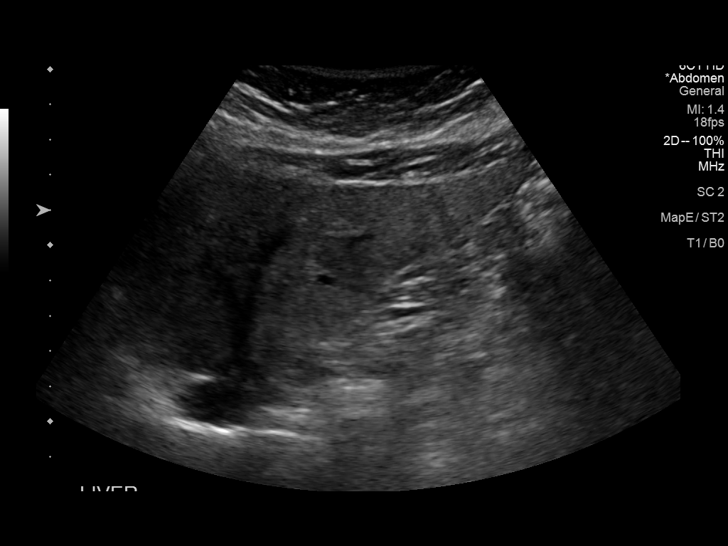

[14 of 25 positions shown; findings below may reference images not displayed]

FINDINGS: Gallbladder:

No gallstones or wall thickening visualized. No sonographic Murphy
sign noted by sonographer.

Common bile duct:

Diameter: 3.3 mm

Liver:

Heterogeneous parenchymal pattern suggesting fatty infiltration or
hepatocellular disease. No focal hepatic abnormality identified.
cm hypoechoic focus in the right hepatic lobe. This is a new finding
from prior study of [DATE]. To further evaluate MRI of the liver
suggested. Portal vein is patent on color Doppler imaging with
normal direction of blood flow towards the liver.

Other: None.
IMPRESSION: 1. No gallstones or biliary distention.
2. Heterogeneous hepatic parenchymal pattern suggesting fatty
infiltration or hepatocellular disease.
3. 1.5 cm hypoechoic focus in the right hepatic lobe. This is a new
finding from prior study of [DATE]. To further evaluate MRI of
the liver suggested.

## 2020-04-28 ENCOUNTER — Other Ambulatory Visit: Payer: Self-pay | Admitting: Family Medicine

## 2020-04-28 DIAGNOSIS — K769 Liver disease, unspecified: Secondary | ICD-10-CM

## 2020-05-02 ENCOUNTER — Ambulatory Visit
Admission: RE | Admit: 2020-05-02 | Discharge: 2020-05-02 | Disposition: A | Discharge status: Home / Self Care | Payer: Medicare Other | Source: Ambulatory Visit | Attending: Family Medicine | Admitting: Family Medicine

## 2020-05-02 ENCOUNTER — Other Ambulatory Visit: Payer: Self-pay

## 2020-05-02 DIAGNOSIS — K769 Liver disease, unspecified: Secondary | ICD-10-CM

## 2020-05-02 IMAGING — MR MR ABDOMEN WO/W CM
13 of 17 series · 30 of 48 positions shown · IV contrast (multihance)
Comparison: Ultrasound [DATE]

CLINICAL DATA: Indeterminate lesion RIGHT hepatic lobe on
ultrasound. MRI recommended for further evaluation.

EXAM:
MRI ABDOMEN WITHOUT AND WITH CONTRAST
TECHNIQUE: Multiplanar multisequence MR imaging of the abdomen was performed
both before and after the administration of intravenous contrast.
CONTRAST:  14mL MULTIHANCE GADOBENATE DIMEGLUMINE 529 MG/ML IV SOLN

[Series 3: T2 · coronal · 5.0mm · 1.56mm/px · 1 of 30 slices shown (1 of 3)]
[im 1/30]
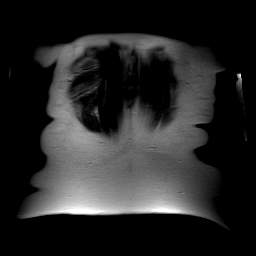

[Series 4: axial tru fisp · axial · 5.0mm · 1.41mm/px · 1 of 37 slices shown]
[im 1/37]
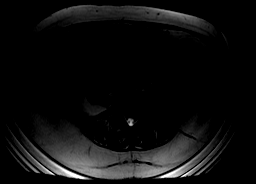

[Series 5: T2 · axial · 6.5mm · 0.74mm/px · 1 of 30 slices shown (2 of 3)]
[im 1/30]
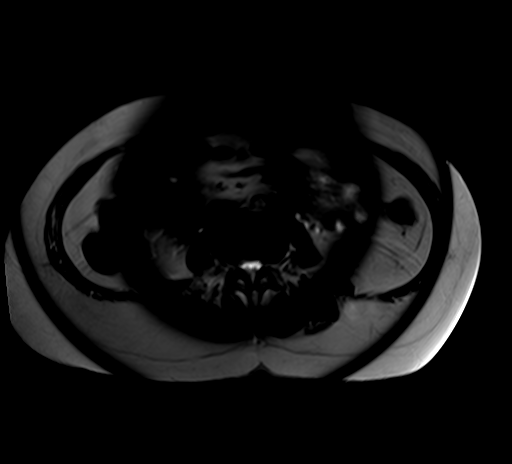

[Series 6: [id number] · axial · 6.0mm · 1.98mm/px · z∈[-95,+131]mm · 3 of 90 slices shown]
[im 1/90]
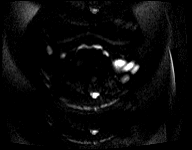
[im 45/90]
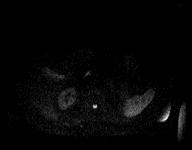
[im 90/90]
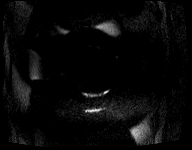

[Series 7: [id number]_adc · axial · 6.0mm · 1.98mm/px · 1 of 30 slices shown]
[im 1/30]
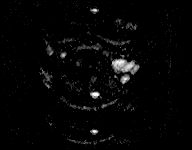

[Series 8: T2 · axial · 5.0mm · 1.37mm/px · 1 of 35 slices shown (3 of 3)]
[im 1/35]
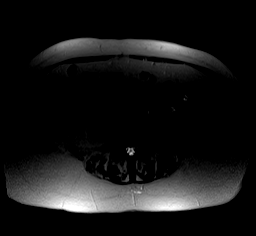

[Series 9: axial in out · axial · 6.0mm · 0.74mm/px · 1 of 60 slices shown]
[im 1/60]
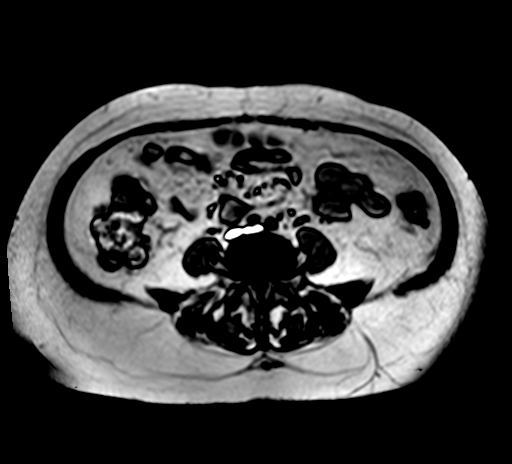

[Series 10: T1 dynamic · axial · non-contrast · 2.2mm · 0.78mm/px · z∈[-120,+71]mm · 4 of 88 slices shown]
[im 1/88]
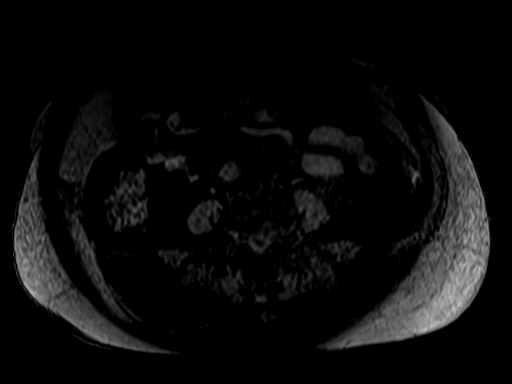
[im 30/88]
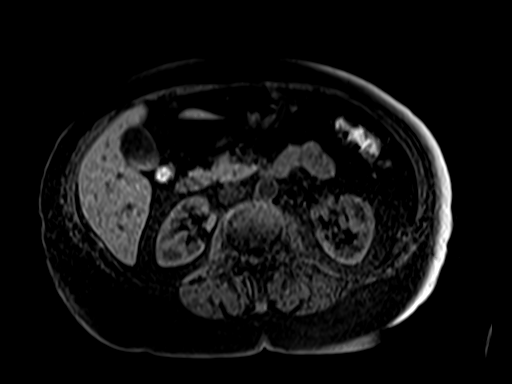
[im 59/88]
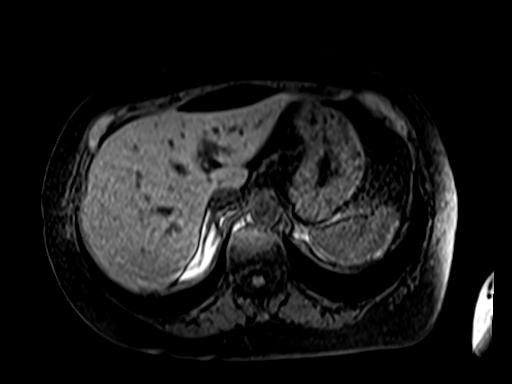
[im 88/88]
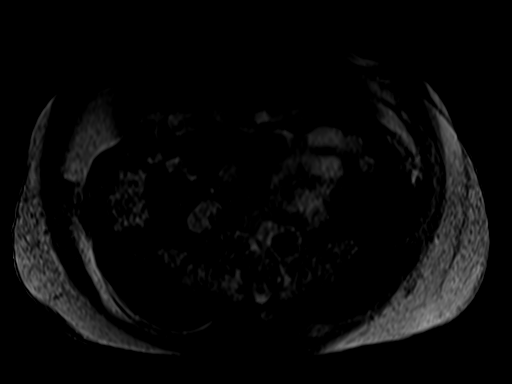

[Series 11: post 25 sec · axial · 2.2mm · 0.78mm/px · z∈[-120,+71]mm · 4 of 88 slices shown]
[im 1/88]
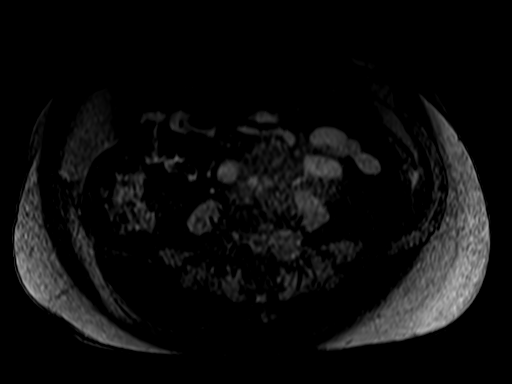
[im 30/88]
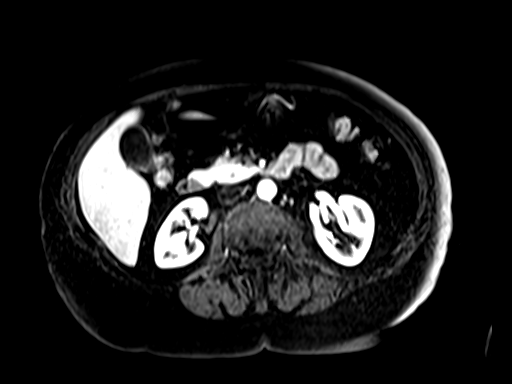
[im 59/88]
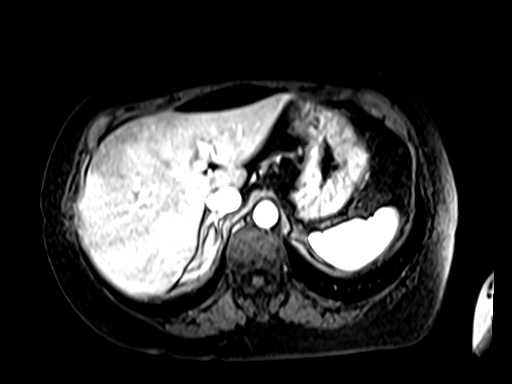
[im 88/88]
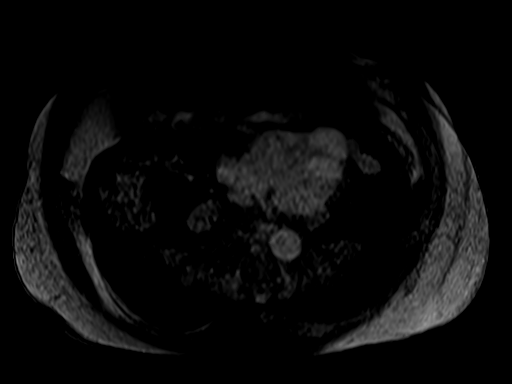

[Series 12: post 25 sec_sub · axial · 2.2mm · 0.78mm/px · z∈[-120,+71]mm · 4 of 88 slices shown]
[im 1/88]
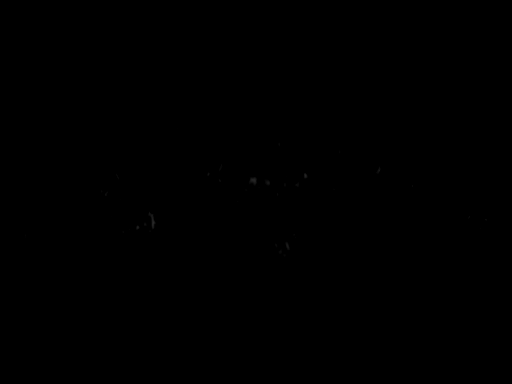
[im 30/88]
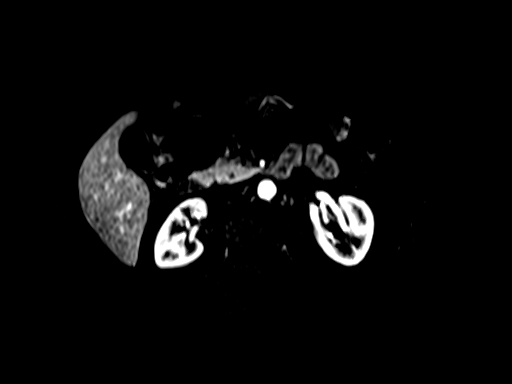
[im 59/88]
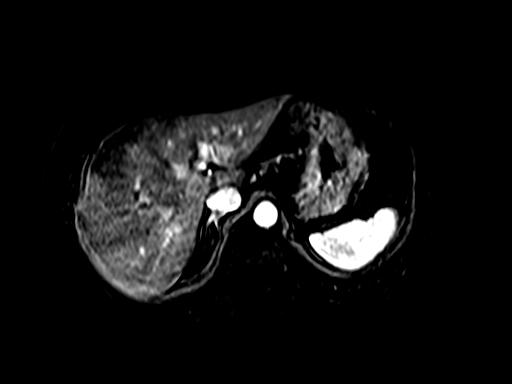
[im 88/88]
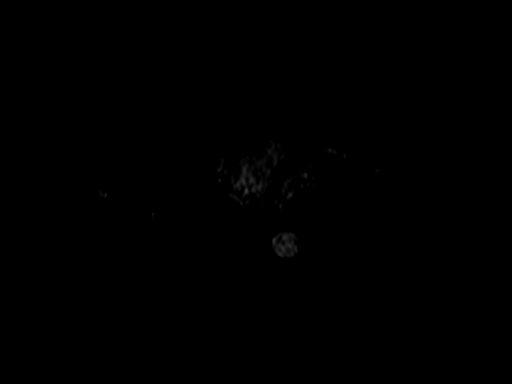

[Series 13: post 45 sec · axial · 2.2mm · 0.78mm/px · z∈[-120,+71]mm · 4 of 88 slices shown]
[im 1/88]
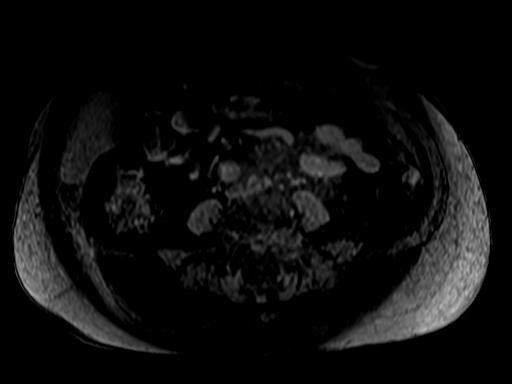
[im 30/88]
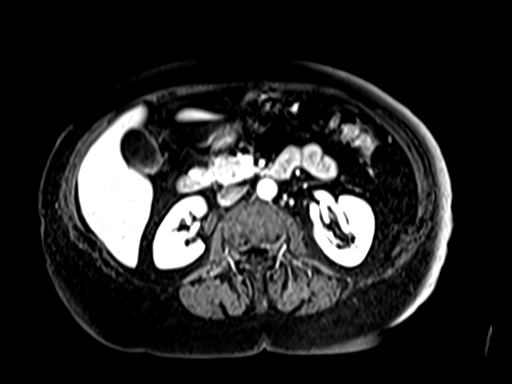
[im 59/88]
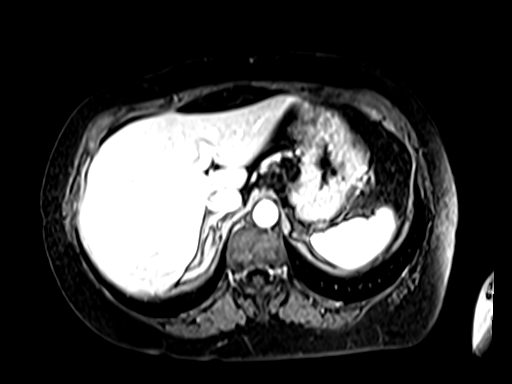
[im 88/88]
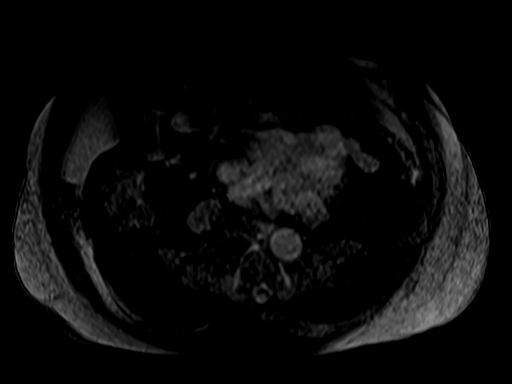

[Series 14: post 45 sec_sub · axial · 2.2mm · 0.78mm/px · z∈[-120,+71]mm · 4 of 88 slices shown]
[im 1/88]
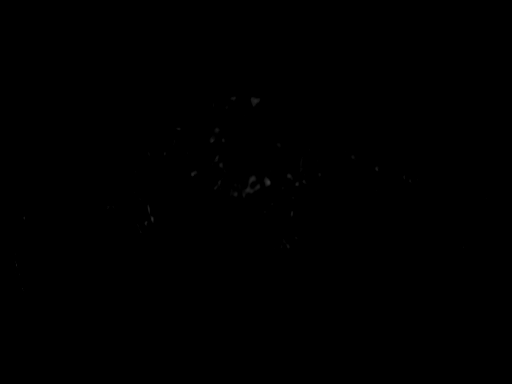
[im 30/88]
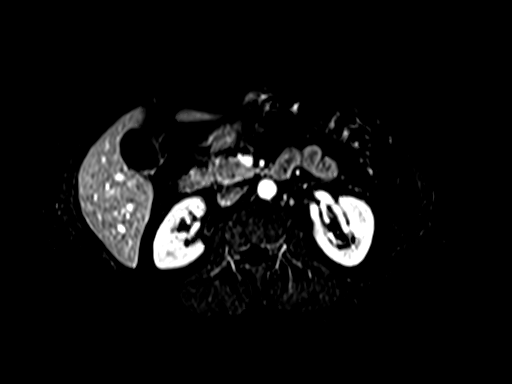
[im 59/88]
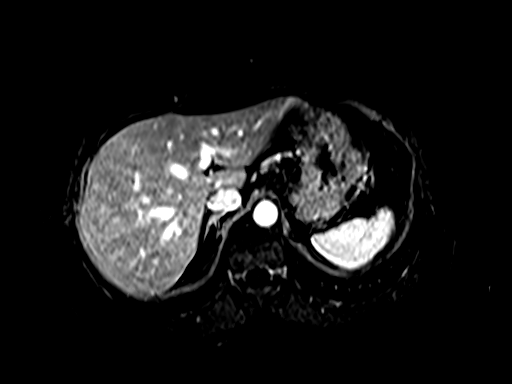
[im 88/88]
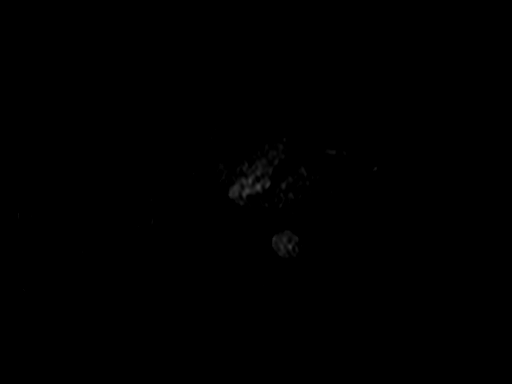

[Series 15: post 90 sec · axial · 2.2mm · 0.78mm/px · 1 of 88 slices shown]
[im 1/88]
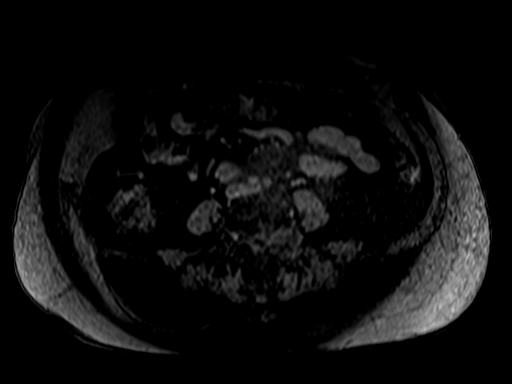

[30 of 48 positions shown; findings below may reference images not displayed]

FINDINGS: Lower chest:  Lung bases are clear.

Hepatobiliary: Within the subcapsular RIGHT hepatic lobe there is a
small rounded lesion which shows intense early uniform enhancement
measuring 1.7 by 0.7 cm (image 40/series 11). Lesion demonstrates
uniform washout on delayed vascular sequences with barely
perceptible residual enhancement (image 40 series 15). Lesion
demonstrates loss signal intensity on opposed phase imaging
suggesting intracellular fat (series 9). Lesion is subtly
hyperintense on T2 weighted imaging seen on diffusion-weighted
imaging (series 7). Lesion does not restrict diffusion.

No additional lesions in liver. There is mild hepatic steatosis.
Biliary tree is normal. Gallbladder normal.

Pancreas: Normal pancreas.

Spleen: Normal spleen.

Adrenals/urinary tract: Adrenal glands and kidneys are normal.

Stomach/Bowel: Stomach and limited of the small bowel is
unremarkable

Vascular/Lymphatic: Abdominal aortic normal caliber. No
retroperitoneal periportal lymphadenopathy.

Musculoskeletal: No aggressive osseous lesion
IMPRESSION: 1. Early enhancing lesion in the subcapsular RIGHT hepatic lobe is
indeterminate with top differential including hepatic adenoma or
focal nodular hyperplasia. Metastatic lesion not entirely excluded
given history of lung and breast cancer. Recommend follow-up MRI in
6 months with hepatocytes specific imaging contrast (Eovist). More
immediate evaluation may be performed with FDG PET/CT scan if
clinical suspicion for breast or lung cancer metastasis.
2. Mild hepatic steatosis.

## 2020-05-02 MED ORDER — GADOBENATE DIMEGLUMINE 529 MG/ML IV SOLN
14.0000 mL | Freq: Once | INTRAVENOUS | Status: AC | PRN
  Administered 2020-05-02: 14 mL via INTRAVENOUS

## 2020-05-15 ENCOUNTER — Other Ambulatory Visit: Payer: Medicare Other

## 2020-07-01 ENCOUNTER — Other Ambulatory Visit: Payer: Self-pay | Admitting: Internal Medicine

## 2020-07-01 DIAGNOSIS — D0511 Intraductal carcinoma in situ of right breast: Secondary | ICD-10-CM

## 2020-07-13 ENCOUNTER — Other Ambulatory Visit: Payer: Medicare Other

## 2020-07-20 ENCOUNTER — Other Ambulatory Visit: Payer: Self-pay

## 2020-07-20 ENCOUNTER — Ambulatory Visit (HOSPITAL_COMMUNITY)
Admission: RE | Admit: 2020-07-20 | Discharge: 2020-07-20 | Disposition: A | Discharge status: Home / Self Care | Payer: Medicare Other | Source: Ambulatory Visit | Attending: Internal Medicine | Admitting: Internal Medicine

## 2020-07-20 ENCOUNTER — Ambulatory Visit: Payer: Medicare Other | Admitting: Internal Medicine

## 2020-07-20 ENCOUNTER — Inpatient Hospital Stay: Payer: Medicare Other | Attending: Internal Medicine

## 2020-07-20 DIAGNOSIS — Z803 Family history of malignant neoplasm of breast: Secondary | ICD-10-CM | POA: Diagnosis present

## 2020-07-20 DIAGNOSIS — D0511 Intraductal carcinoma in situ of right breast: Secondary | ICD-10-CM | POA: Diagnosis present

## 2020-07-20 DIAGNOSIS — Z170 Estrogen receptor positive status [ER+]: Secondary | ICD-10-CM

## 2020-07-20 DIAGNOSIS — C50111 Malignant neoplasm of central portion of right female breast: Secondary | ICD-10-CM

## 2020-07-20 DIAGNOSIS — R978 Other abnormal tumor markers: Secondary | ICD-10-CM | POA: Diagnosis not present

## 2020-07-20 LAB — CBC WITH DIFFERENTIAL (CANCER CENTER ONLY)
Abs Immature Granulocytes: 0.01 10*3/uL (ref 0.00–0.07)
Basophils Absolute: 0 10*3/uL (ref 0.0–0.1)
Basophils Relative: 1 %
Eosinophils Absolute: 0.4 10*3/uL (ref 0.0–0.5)
Eosinophils Relative: 8 %
HCT: 38.6 % (ref 36.0–46.0)
Hemoglobin: 12.2 g/dL (ref 12.0–15.0)
Immature Granulocytes: 0 %
Lymphocytes Relative: 32 %
Lymphs Abs: 1.8 10*3/uL (ref 0.7–4.0)
MCH: 29.5 pg (ref 26.0–34.0)
MCHC: 31.6 g/dL (ref 30.0–36.0)
MCV: 93.5 fL (ref 80.0–100.0)
Monocytes Absolute: 0.6 10*3/uL (ref 0.1–1.0)
Monocytes Relative: 10 %
Neutro Abs: 2.8 10*3/uL (ref 1.7–7.7)
Neutrophils Relative %: 49 %
Platelet Count: 266 10*3/uL (ref 150–400)
RBC: 4.13 MIL/uL (ref 3.87–5.11)
RDW: 14 % (ref 11.5–15.5)
WBC Count: 5.7 10*3/uL (ref 4.0–10.5)
nRBC: 0 % (ref 0.0–0.2)

## 2020-07-20 LAB — CMP (CANCER CENTER ONLY)
ALT: 11 U/L (ref 0–44)
AST: 18 U/L (ref 15–41)
Albumin: 3.3 g/dL — ABNORMAL LOW (ref 3.5–5.0)
Alkaline Phosphatase: 62 U/L (ref 38–126)
Anion gap: 9 (ref 5–15)
BUN: 15 mg/dL (ref 8–23)
CO2: 26 mmol/L (ref 22–32)
Calcium: 9 mg/dL (ref 8.9–10.3)
Chloride: 108 mmol/L (ref 98–111)
Creatinine: 0.8 mg/dL (ref 0.44–1.00)
GFR, Estimated: >60 mL/min (ref >60)
Glucose, Bld: 102 mg/dL — ABNORMAL HIGH (ref 70–99)
Potassium: 4.8 mmol/L (ref 3.5–5.1)
Sodium: 143 mmol/L (ref 135–145)
Total Bilirubin: 0.3 mg/dL (ref 0.3–1.2)
Total Protein: 6.5 g/dL (ref 6.5–8.1)

## 2020-07-20 IMAGING — DX DG CHEST 1V
1 series · 1 of 1 positions shown · non-contrast
Comparison: [DATE], [DATE]

CLINICAL DATA: Breast cancer

EXAM:
CHEST  1 VIEW

[chest pa]
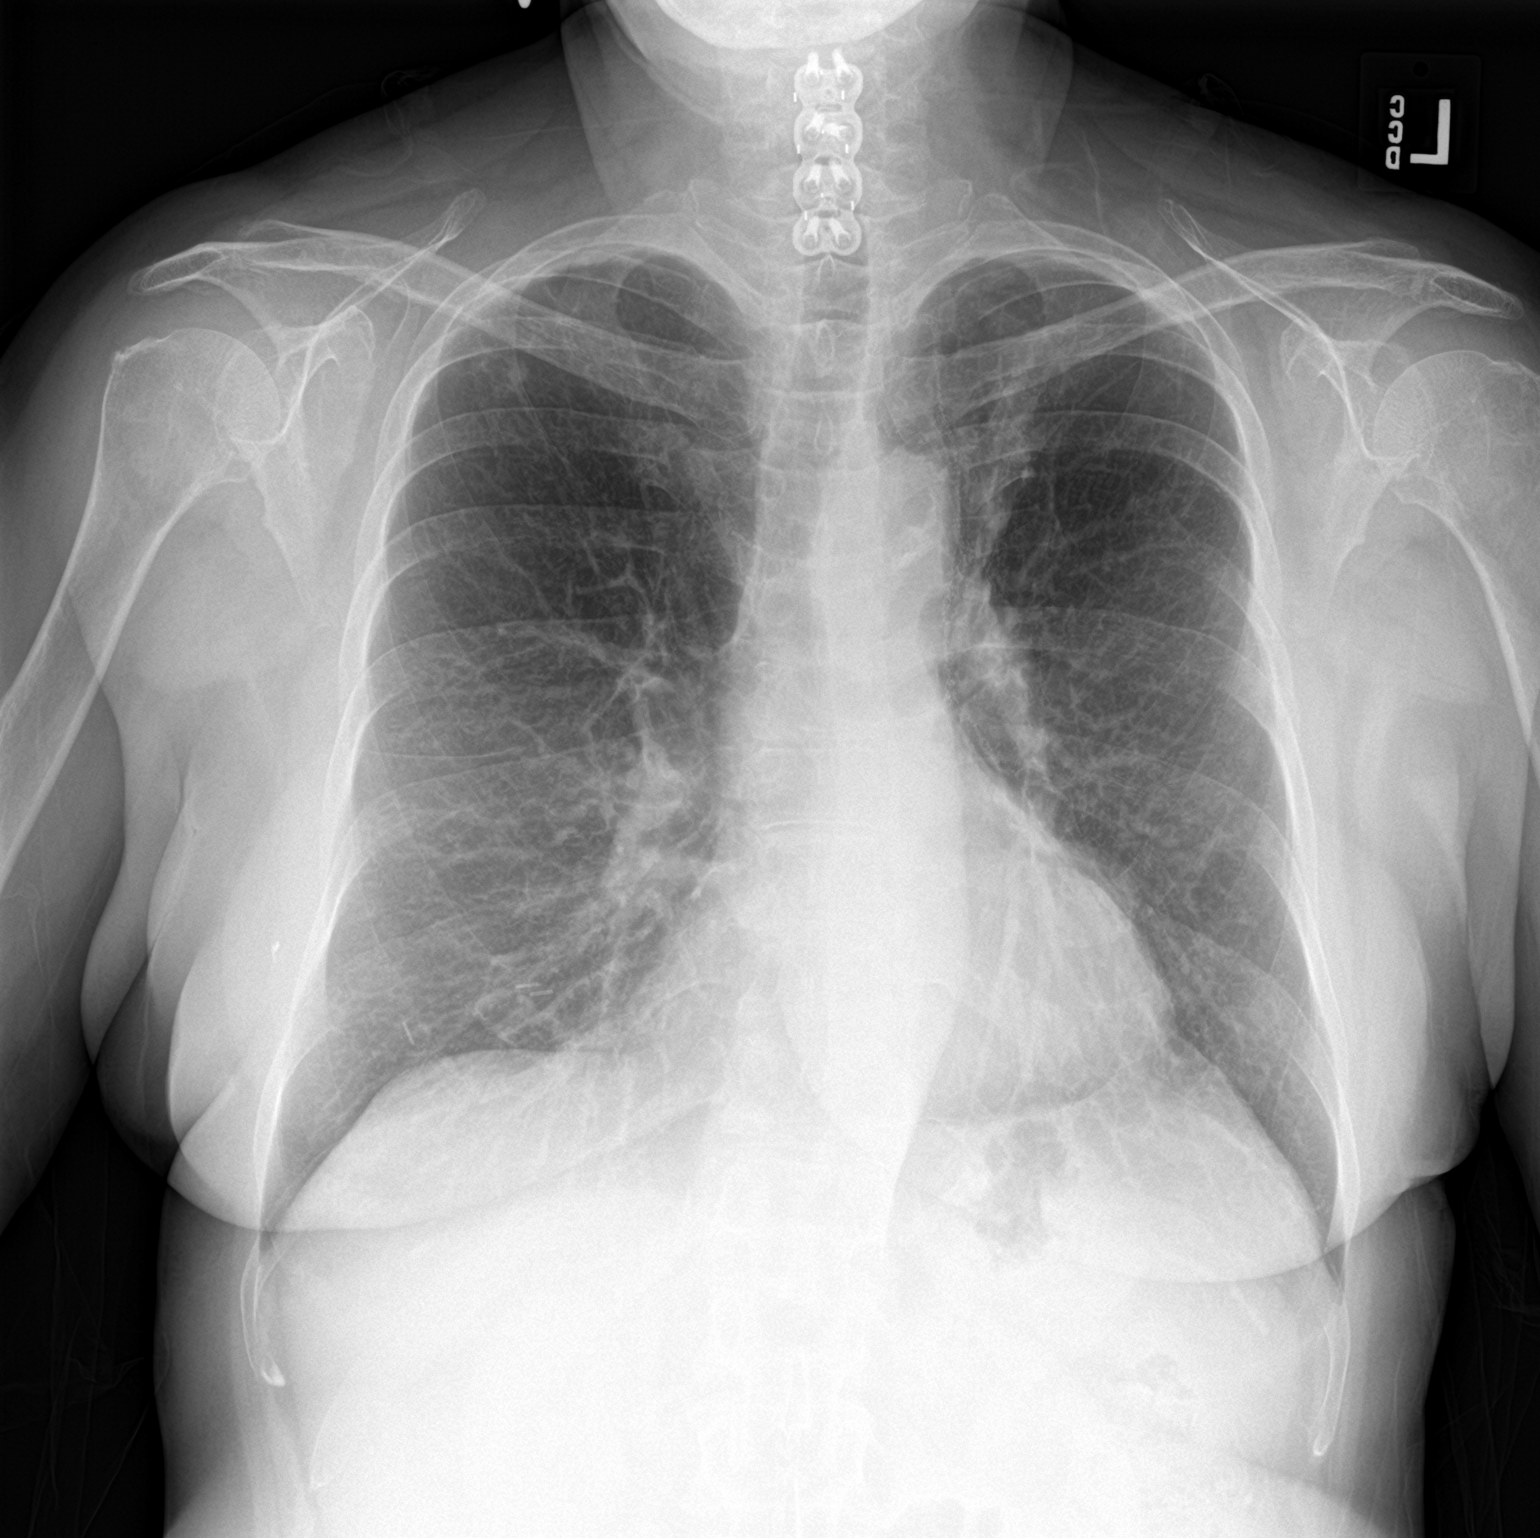

[1 of 1 positions shown; findings below may reference images not displayed]

FINDINGS: Focal opacity within the right apex is stable from multiple prior
examinations likely representing a small focus of parenchymal
scarring. The lungs are otherwise clear. No pneumothorax or pleural
effusion. Cardiac size within normal limits. Pulmonary vascularity
is normal. Cervical fusion hardware now identified. Surgical clips
are seen within the right breast. No acute bone abnormality.
IMPRESSION: No evidence of pulmonary metastatic disease.

## 2020-07-21 LAB — CANCER ANTIGEN 27.29: CA 27.29: 28.9 U/mL (ref 0.0–38.6)

## 2020-07-27 ENCOUNTER — Inpatient Hospital Stay: Payer: Medicare Other | Attending: Internal Medicine | Admitting: Internal Medicine

## 2020-07-27 ENCOUNTER — Other Ambulatory Visit: Payer: Self-pay

## 2020-07-27 VITALS — BP 134/80 | HR 78 | Temp 97.3°F | Resp 19 | Ht 64.0 in | Wt 156.2 lb

## 2020-07-27 DIAGNOSIS — Z79811 Long term (current) use of aromatase inhibitors: Secondary | ICD-10-CM | POA: Insufficient documentation

## 2020-07-27 DIAGNOSIS — Z85118 Personal history of other malignant neoplasm of bronchus and lung: Secondary | ICD-10-CM | POA: Insufficient documentation

## 2020-07-27 DIAGNOSIS — Z170 Estrogen receptor positive status [ER+]: Secondary | ICD-10-CM | POA: Diagnosis not present

## 2020-07-27 DIAGNOSIS — Z803 Family history of malignant neoplasm of breast: Secondary | ICD-10-CM | POA: Insufficient documentation

## 2020-07-27 DIAGNOSIS — Z800 Family history of malignant neoplasm of digestive organs: Secondary | ICD-10-CM | POA: Insufficient documentation

## 2020-07-27 DIAGNOSIS — D0511 Intraductal carcinoma in situ of right breast: Secondary | ICD-10-CM | POA: Insufficient documentation

## 2020-07-27 DIAGNOSIS — M5030 Other cervical disc degeneration, unspecified cervical region: Secondary | ICD-10-CM | POA: Diagnosis not present

## 2020-07-27 DIAGNOSIS — Z79899 Other long term (current) drug therapy: Secondary | ICD-10-CM | POA: Diagnosis not present

## 2020-07-27 DIAGNOSIS — E079 Disorder of thyroid, unspecified: Secondary | ICD-10-CM | POA: Insufficient documentation

## 2020-07-27 NOTE — Progress Notes (Signed)
[facility] [phone]   [phone]) [phone]  OFFICE PROGRESS NOTE  [name], MD [place] [address]  DIAGNOSIS:  1) stage IIA non-small cell lung cancer diagnosed in March of 2006 2) recurrent ductal carcinoma in situ of the right breast initially diagnosed in January of 2014 with recurrence in October of 2014.  PRIOR THERAPY: 1) status post left trisegmentectomy with node dissection on 06/24/2004 2) status post 4 cycles of adjuvant chemotherapy with carboplatin and paclitaxel last dose was given 10/24/2004. 3) status post right partial mastectomy on 04/10/2012 under the care of Dr. Brantley Stage. 4) status post adjuvant radiotherapy to the right breast under the care of Dr. Valere Dross  CURRENT THERAPY: Tamoxifen 20 mg by mouth daily started in May of 2014.  INTERVAL HISTORY: [name] 70 y.o. female returns to the clinic today for annual follow-up visit.  The patient is feeling fine today with no concerning complaints.  She underwent neck surgery for spinal stenosis under the care of Dr. Vertell Limber and she felt much better.  She denied having any current chest pain, shortness of breath, cough or hemoptysis.  She denied having any fever or chills.  She has no nausea, vomiting, diarrhea or constipation.  She has no weight loss or night sweats.  She continues to tolerate her treatment with tamoxifen fairly well.  The patient had repeat blood work and chest x-ray done recently and she is here for evaluation and discussion of her lab and imaging results.   MEDICAL HISTORY: Past Medical History:  Diagnosis Date  . Anxiety   . Breast cancer ([facility])    2014 and 2015  . Cancer ([place])    lung cancer   . DDD (degenerative disc disease)    neck  . Dry eye   . Eczema   . Family history of breast cancer   . Family history of stomach cancer   . GERD (gastroesophageal reflux disease)    occasional - no current med.  Marland Kitchen History of  breast cancer   . History of lung cancer    non-small cell - left upper lobe  . IBS (irritable bowel syndrome)   . Non-allergic rhinitis    sinusitis  . Osteoarthritis   . Personal history of radiation therapy 2014  . PONV (postoperative nausea and vomiting)   . Radiation 07/03/12-07/09/12   Right upper lung 5400 cGy  . Sinus headache    occasional  . Thyroid nodule, toxic or with hyperthyroidism    no current med.  . Urinary, incontinence, stress female   . Vasomotor rhinitis   . Vertigo     ALLERGIES:  is allergic to morphine and related, amoxicillin, asa [aspirin], banana, ceftin [cefuroxime axetil], citrus, lactose, lactose intolerance (gi), orange oil, penicillins, other, and tape.  MEDICATIONS:  Current Outpatient Medications  Medication Sig Dispense Refill  . acetaminophen (TYLENOL) 500 MG tablet Take 1,000 mg by mouth at bedtime as needed for moderate pain.     . cyclobenzaprine (FLEXERIL) 10 MG tablet Take 5-10 mg by mouth at bedtime as needed for muscle spasms.   0  . cycloSPORINE (RESTASIS) 0.05 % ophthalmic emulsion Place 1 drop into both eyes 2 (two) times daily.     Marland Kitchen esomeprazole (NEXIUM) 20 MG capsule Take 20 mg by mouth daily at 12 noon.    . fluocinonide gel (LIDEX) 3.23 % Apply 1 application topically daily as needed (canker sores).     Marland Kitchen  fluticasone (FLONASE) 50 MCG/ACT nasal spray Place 2 sprays into both nostrils daily.     . Lactobacillus (ACIDOPHILUS PO) Take 5 mg by mouth daily. (Patient not taking: Reported on 12/09/2019)    . Lactobacillus (ACIDOPHILUS) 0.5 MG TABS Take 0.5 mg by mouth daily.     . Multiple Vitamin (MULTIVITAMIN) capsule Take 1 capsule by mouth in the morning.     Marland Kitchen olopatadine (PATADAY) 0.1 % ophthalmic solution Place 1 drop into both eyes daily as needed for allergies.    Vladimir Faster Glycol-Propyl Glycol (SYSTANE OP) Place 1 drop into both eyes daily as needed (dry eyes).     . tamoxifen (NOLVADEX) 20 MG tablet TAKE 1 TABLET BY MOUTH  EVERY DAY 30 tablet 5  . vitamin E 400 UNIT capsule Take 400 Units by mouth daily.      No current facility-administered medications for this visit.    SURGICAL HISTORY:  Past Surgical History:  Procedure Laterality Date  . ANTERIOR CERVICAL DECOMP/DISCECTOMY FUSION N/A 12/22/2019   Procedure: Cervical four-five Cervical five-six Cervical six-seven Anterior cervical decompression/discectomy/fusion;  Surgeon: Erline Levine, MD;  Location: [facility];  Service: Neurosurgery;  Laterality: N/A;  . APPENDECTOMY  1971  . BREAST LUMPECTOMY Right 2014  . BREAST RECONSTRUCTION WITH PLACEMENT OF TISSUE EXPANDER AND FLEX HD (ACELLULAR HYDRATED DERMIS) Right 05/20/2013   Procedure: IMMEDIATE RIGHT BREAST RECONSTRUCTION WITH LATISSAMUS MUSCLE FLAP AND PLACEMENT OF TISSUE EXPANDER TO RIGHT BREAST;  Surgeon: Theodoro Kos, DO;  Location: [facility];  Service: Plastics;  Laterality: Right;  . BREAST REDUCTION WITH MASTOPEXY Left 10/22/2013   Procedure: BREAST REDUCTION/MASTOPEXY TO LEFT BREAST WITH LIPOSUCTION/FAT GRAFTING FOR SYMMETRY;  Surgeon: Theodoro Kos, DO;  Location: [facility];  Service: Plastics;  Laterality: Left;  . DE QUERVAIN'S RELEASE Left 07/31/2002  . FRACTURE SURGERY Left 2019   Torn meniscus; bone fragments removed  . ganglion cyst R hand     1980s  . KNEE SURGERY Left    torn meniscus  . LUNG REMOVAL, PARTIAL     2014  . MASTECTOMY Right    2015  . MASTECTOMY W/ SENTINEL NODE BIOPSY Right 05/20/2013   Procedure: RIGHT SIMPLEMASTECTOMY AND SENTINEL NODE   Sampling;  Surgeon: Joyice Faster. Cornett, MD;  Location: [facility];  Service: General;  Laterality: Right;  . MOUTH SURGERY Right may 2014   "keratocystic odotogenic tumor" - X 2  . NASAL SINUS SURGERY  ?1988-1990   "at least 3" (05/21/2013)  . PARTIAL MASTECTOMY WITH NEEDLE LOCALIZATION  04/10/2012   Procedure: PARTIAL MASTECTOMY WITH NEEDLE LOCALIZATION;  Surgeon: Marcello Moores A. Cornett, MD;  Location: [facility];  Service:  General;  Laterality: Right;  . REDUCTION MAMMAPLASTY Left 2015  . REMOVAL OF TISSUE EXPANDER AND PLACEMENT OF IMPLANT Right 10/22/2013   Procedure: REMOVAL OF RIGHT TISSUE EXPANDER WITH PLACEMENT OF RIGHT BREAST IMPLANT;  Surgeon: Theodoro Kos, DO;  Location: [facility];  Service: Plastics;  Laterality: Right;  . SEPTOPLASTY     & sinus surgeries 1980s-1990s  . THORACOTOMY Left 07/14/2004  . TRIGGER FINGER RELEASE Bilateral    "I've had OR on q finger" (05/21/2013)  . TRIGGER FINGER RELEASE Right 07/14/1999   index and little fingers  . TRIGGER FINGER RELEASE Right 07/17/2002   long and ring fingers  . TRIGGER FINGER RELEASE Left 07/31/2002   little, ring and index fingers  . VIDEO ASSISTED THORACOSCOPY (VATS)/ LOBECTOMY Left 07/14/2004   upper tri-segmentectomy  . WISDOM TOOTH EXTRACTION     "  3"    REVIEW OF SYSTEMS:  A comprehensive review of systems was negative.   PHYSICAL EXAMINATION: General appearance: alert, cooperative and no distress Head: Normocephalic, without obvious abnormality, atraumatic Neck: no adenopathy Lymph nodes: Cervical, supraclavicular, and axillary nodes normal. Resp: clear to auscultation bilaterally Back: symmetric, no curvature. ROM normal. No CVA tenderness. Cardio: regular rate and rhythm, S1, S2 normal, no murmur, click, rub or gallop GI: soft, non-tender; bowel sounds normal; no masses,  no organomegaly Extremities: extremities normal, atraumatic, no cyanosis or edema  ECOG PERFORMANCE STATUS: 0 - Asymptomatic  Blood pressure 134/80, pulse 78, temperature (!) 97.3 F (36.3 C), temperature source Tympanic, resp. rate 19, height 5\' 4"  (1.626 m), weight 156 lb 3.2 oz (70.9 kg), SpO2 99 %.  LABORATORY DATA: Lab Results  Component Value Date   WBC 5.7 07/20/2020   HGB 12.2 07/20/2020   HCT 38.6 07/20/2020   MCV 93.5 07/20/2020   PLT 266 07/20/2020      Chemistry      Component Value Date/Time   NA 143 07/20/2020 0740   NA 145  (H) 05/06/2019 1021   NA 141 10/29/2016 0742   K 4.8 07/20/2020 0740   K 4.1 10/29/2016 0742   CL 108 07/20/2020 0740   CL 108 (H) 04/28/2012 1008   CO2 26 07/20/2020 0740   CO2 26 10/29/2016 0742   BUN 15 07/20/2020 0740   BUN 15 05/06/2019 1021   BUN 21.1 10/29/2016 0742   CREATININE 0.80 07/20/2020 0740   CREATININE 0.9 10/29/2016 0742      Component Value Date/Time   CALCIUM 9.0 07/20/2020 0740   CALCIUM 9.0 10/29/2016 0742   ALKPHOS 62 07/20/2020 0740   ALKPHOS 49 10/29/2016 0742   AST 18 07/20/2020 0740   AST 15 10/29/2016 0742   ALT 11 07/20/2020 0740   ALT 12 10/29/2016 0742   BILITOT 0.3 07/20/2020 0740   BILITOT 0.32 10/29/2016 0742       RADIOGRAPHIC STUDIES: DG Chest 1 View  Result Date: 07/21/2020 CLINICAL DATA:  Breast cancer EXAM: CHEST  1 VIEW COMPARISON:  07/15/2019, 06/10/2018 FINDINGS: Focal opacity within the right apex is stable from multiple prior examinations likely representing a small focus of parenchymal scarring. The lungs are otherwise clear. No pneumothorax or pleural effusion. Cardiac size within normal limits. Pulmonary vascularity is normal. Cervical fusion hardware now identified. Surgical clips are seen within the right breast. No acute bone abnormality. IMPRESSION: No evidence of pulmonary metastatic disease. Electronically Signed   By: Fidela Salisbury MD   On: 07/21/2020 03:55   ASSESSMENT AND PLAN:  This is a very pleasant 70 years old white female with stage IIA non-small cell lung cancer as well as recurrent breast ductal carcinoma in situ. The patient is status post right lumpectomy followed by adjuvant radiotherapy.  The patient has been on tamoxifen for the last 8 years and has been tolerating it fairly well.. I recommended for her to continue her current treatment with tamoxifen 20 mg p.o. daily. I will see her back for follow-up visit in 1 year for evaluation with repeat blood work and chest x-ray.  She is scheduled to have her  mammogram in June 2022. The patient was advised to call immediately if she has any concerning symptoms in the interval  All questions were answered. The patient knows to call the clinic with any problems, questions or concerns. We can certainly see the patient much sooner if necessary.   Disclaimer: This note was  dictated with voice recognition software. Similar sounding words can inadvertently be transcribed and may be missed upon review.

## 2020-08-12 ENCOUNTER — Telehealth: Payer: Self-pay | Admitting: Internal Medicine

## 2020-08-12 NOTE — Telephone Encounter (Signed)
R/s appts per 5/20 sch msg. Pt aware.

## 2020-11-17 ENCOUNTER — Other Ambulatory Visit: Payer: Self-pay | Admitting: Family Medicine

## 2020-11-17 DIAGNOSIS — K7689 Other specified diseases of liver: Secondary | ICD-10-CM

## 2020-11-22 ENCOUNTER — Other Ambulatory Visit: Payer: Self-pay | Admitting: Family Medicine

## 2020-11-22 DIAGNOSIS — Z1231 Encounter for screening mammogram for malignant neoplasm of breast: Secondary | ICD-10-CM

## 2020-11-30 ENCOUNTER — Ambulatory Visit
Admission: RE | Admit: 2020-11-30 | Discharge: 2020-11-30 | Disposition: A | Discharge status: Home / Self Care | Payer: Medicare Other | Source: Ambulatory Visit | Attending: Family Medicine | Admitting: Family Medicine

## 2020-11-30 DIAGNOSIS — K7689 Other specified diseases of liver: Secondary | ICD-10-CM

## 2020-11-30 IMAGING — MR MR ABDOMEN WO/W CM
9 of 17 series · 23 of 48 positions shown · IV contrast (7ml eovist)
Comparison: [DATE]

CLINICAL DATA: Further characterize indeterminate right lobe liver
lesion, history of lung and breast cancer

EXAM:
MRI ABDOMEN WITHOUT AND WITH CONTRAST
TECHNIQUE: Multiplanar multisequence MR imaging of the abdomen was performed
both before and after the administration of intravenous contrast.
CONTRAST:  7mL EOVIST GADOXETATE DISODIUM 0.25 MOL/L IV SOLN

[Series 3: cor haste · coronal · 5.0mm · 0.82mm/px · 2 of 34 slices shown]
[im 1/34]
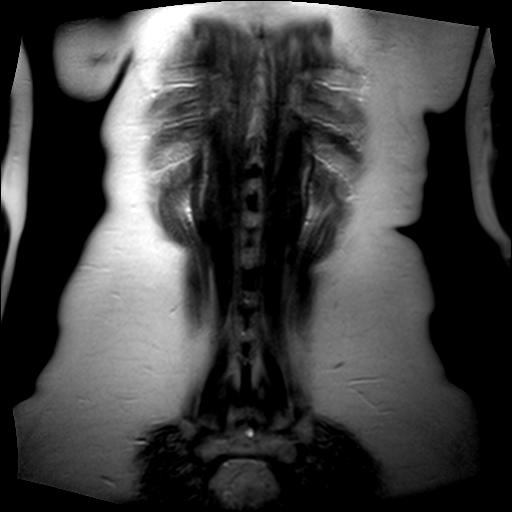
[im 34/34]
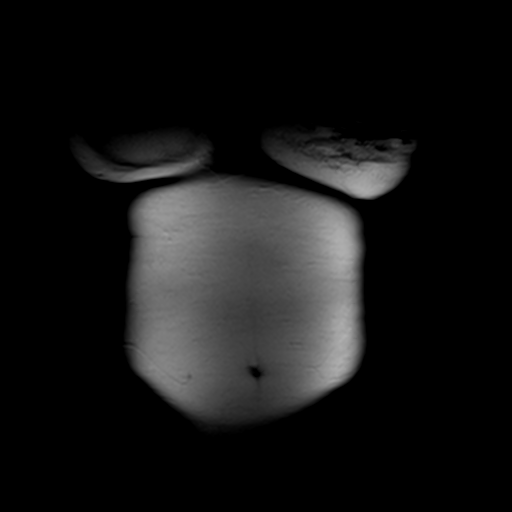

[Series 4: axial haste · axial · 5.0mm · 0.80mm/px · z∈[-109,+119]mm · 2 of 39 slices shown]
[im 1/39]
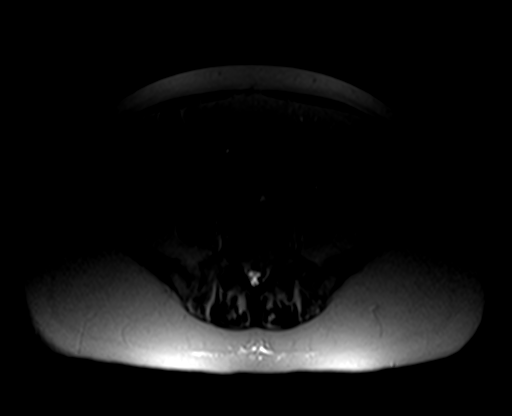
[im 39/39]
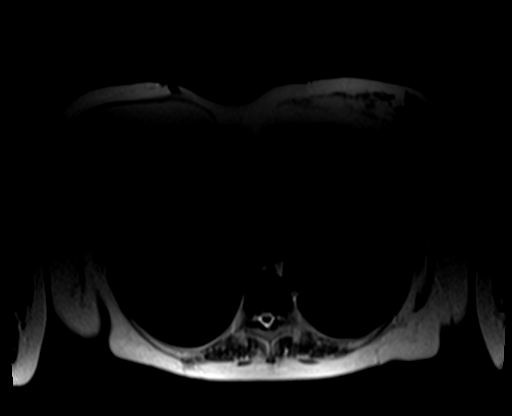

[Series 5: bSSFP · axial · 5.0mm · 0.80mm/px · z∈[-86,+109]mm · 3 of 40 slices shown]
[im 1/40]
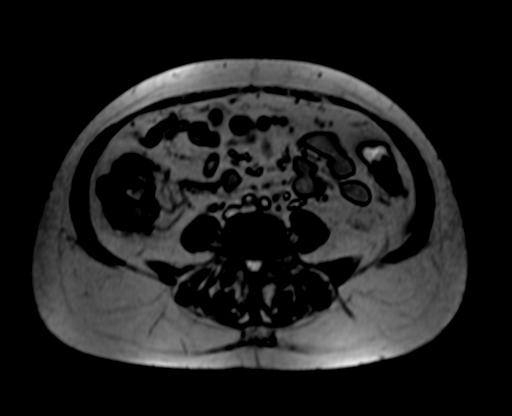
[im 20/40]
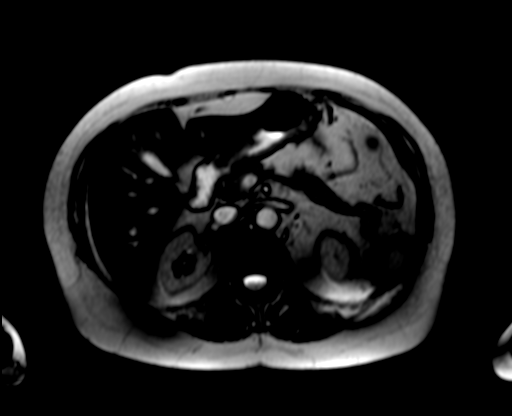
[im 40/40]
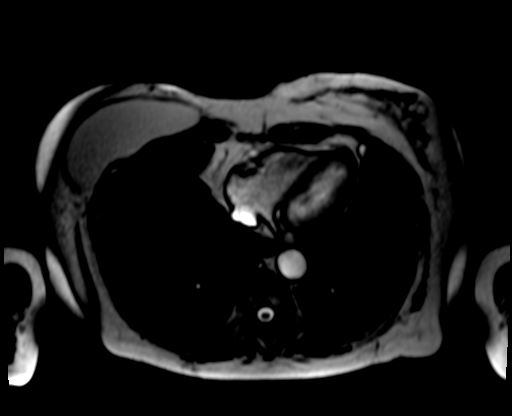

[Series 6: T1 · axial · 6.0mm · 0.68mm/px · z∈[-88,+110]mm · 2 of 62 slices shown]
[im 1/62]
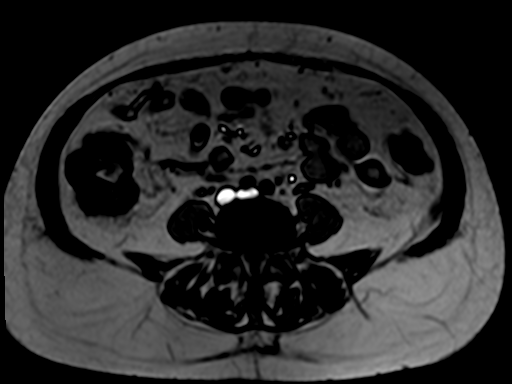
[im 62/62]
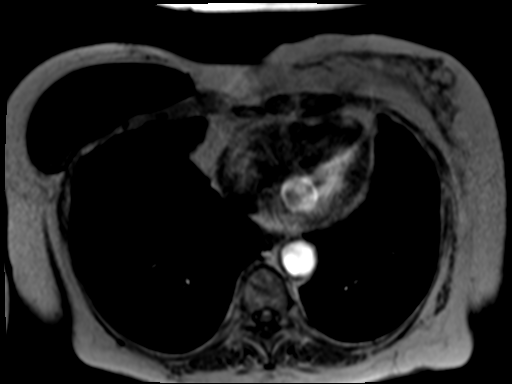

[Series 7: T1 dynamic · axial · non-contrast · 2.7mm · 0.80mm/px · z∈[-106,+129]mm · 3 of 88 slices shown]
[im 1/88]
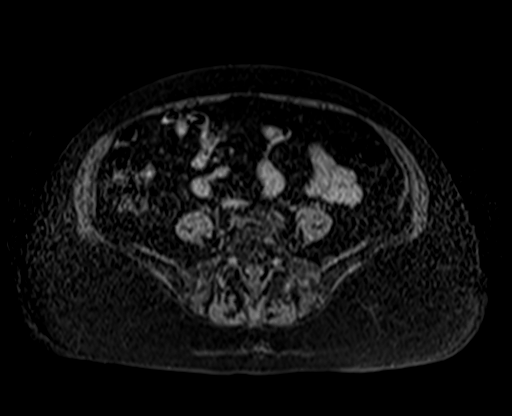
[im 44/88]
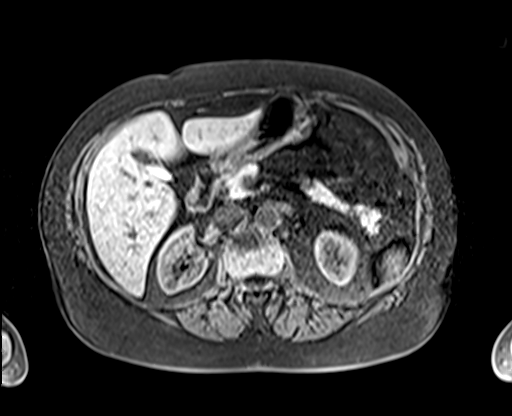
[im 88/88]
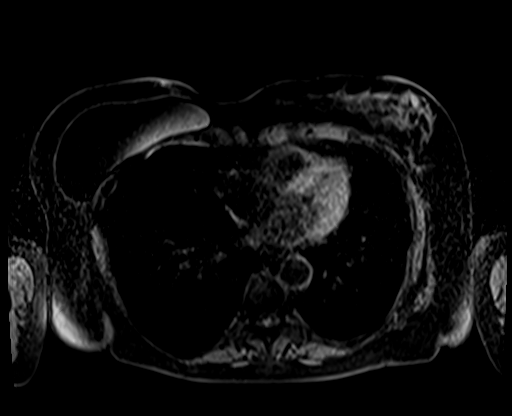

[Series 8: T1 dynamic post-contrast · axial · 2.7mm · 0.80mm/px · z∈[-106,+129]mm · 3 of 88 slices shown (1 of 4)]
[im 1/88]
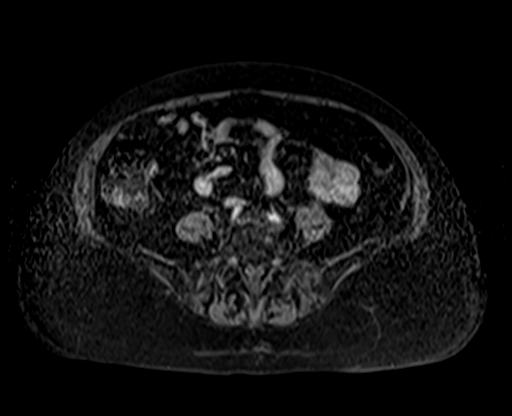
[im 44/88]
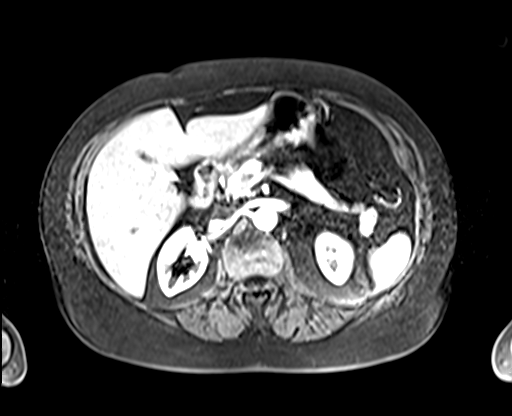
[im 88/88]
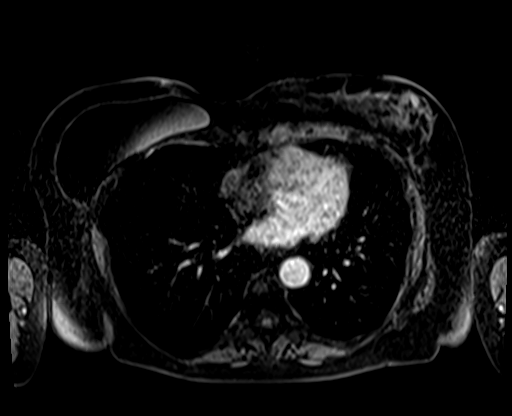

[Series 9: T1 dynamic post-contrast · axial · 2.7mm · 0.80mm/px · z∈[-106,+129]mm · 3 of 88 slices shown (2 of 4)]
[im 1/88]
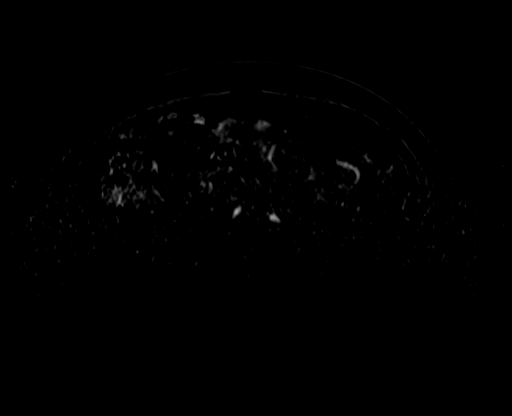
[im 44/88]
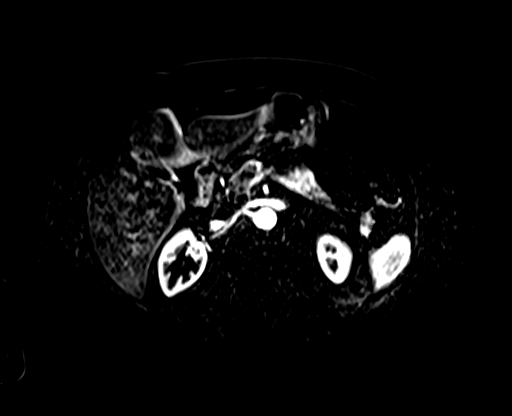
[im 88/88]
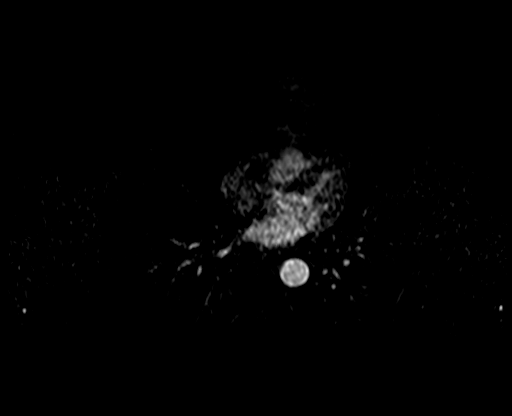

[Series 10: T1 dynamic post-contrast · axial · 2.7mm · 0.80mm/px · z∈[-106,+129]mm · 3 of 88 slices shown (3 of 4)]
[im 1/88]
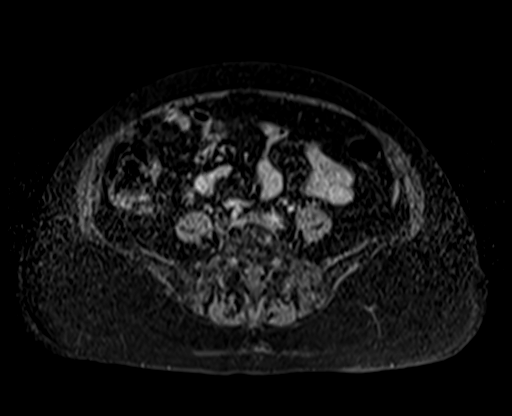
[im 44/88]
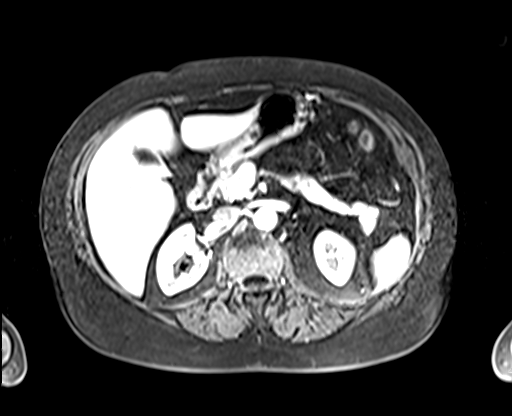
[im 88/88]
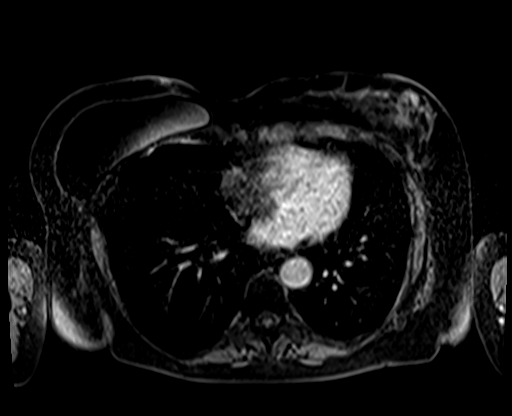

[Series 11: T1 dynamic post-contrast · axial · 2.7mm · 0.80mm/px · z∈[-106,+10]mm · 2 of 88 slices shown (4 of 4)]
[im 1/88]
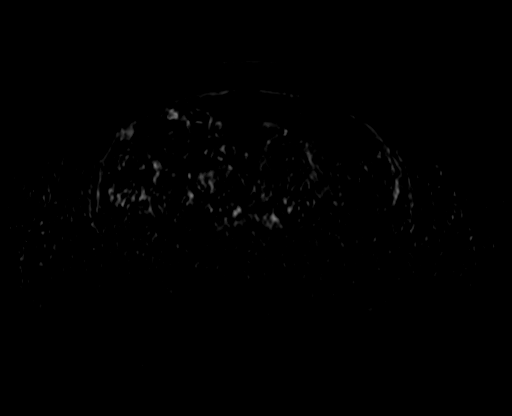
[im 44/88]
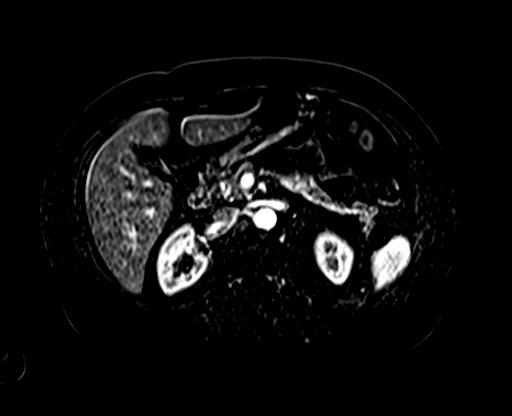

[23 of 48 positions shown; findings below may reference images not displayed]

FINDINGS: Lower chest: No acute findings. Status post right mastectomy with
implant reconstruction.

Hepatobiliary: Unchanged subcapsular lesion of the lateral inferior
right lobe of the liver, hepatic segment VI, which measures 1.7 x
0.9 cm and demonstrates vivid early arterial phase enhancement,
fading to slight hypoenhancement on all phases, including delayed 20
minute hepatobiliary phase (series 8, image 39, series 18, image
39). No intrinsic associated signal abnormality. No other mass or
other parenchymal abnormality identified. Sludge in the gallbladder.
No discrete gallstones. No biliary ductal dilatation.

Pancreas: No mass, inflammatory changes, or other parenchymal
abnormality identified.

Spleen:  Within normal limits in size and appearance.

Adrenals/Urinary Tract: No masses identified. No evidence of
hydronephrosis.

Stomach/Bowel: Visualized portions within the abdomen are
unremarkable.

Vascular/Lymphatic: No pathologically enlarged lymph nodes
identified. No abdominal aortic aneurysm demonstrated.

Other:  None.

Musculoskeletal: No suspicious bone lesions identified.
IMPRESSION: 1. Unchanged subcapsular lesion of the lateral inferior right lobe
of the liver, hepatic segment VI, measuring 1.7 x 0.9 cm and
demonstrates vivid early arterial phase enhancement, fading to
slight hypoenhancement on all phases, including delayed 20 minute
hepatobiliary phase. Findings are consistent with a benign focal
nodular hyperplasia, metastasis strongly disfavored despite known
history of malignancy.
2. Sludge in the gallbladder. No discrete gallstones. No biliary
ductal dilatation.

## 2020-11-30 MED ORDER — GADOXETATE DISODIUM 0.25 MMOL/ML IV SOLN
7.0000 mL | Freq: Once | INTRAVENOUS | Status: AC | PRN
  Administered 2020-11-30: 7 mL via INTRAVENOUS

## 2020-12-29 ENCOUNTER — Other Ambulatory Visit: Payer: Self-pay

## 2020-12-29 ENCOUNTER — Ambulatory Visit
Admission: RE | Admit: 2020-12-29 | Discharge: 2020-12-29 | Disposition: A | Discharge status: Home / Self Care | Payer: Medicare Other | Source: Ambulatory Visit | Attending: Family Medicine | Admitting: Family Medicine

## 2020-12-29 DIAGNOSIS — Z1231 Encounter for screening mammogram for malignant neoplasm of breast: Secondary | ICD-10-CM

## 2020-12-29 IMAGING — MG DIGITAL SCREENING UNILAT LEFT W/ TOMO W/ CAD
4 series · 4 of 12 positions shown · non-contrast
Comparison: Previous exam(s).

CLINICAL DATA: Screening.

EXAM:
DIGITAL SCREENING UNILATERAL LEFT MAMMOGRAM WITH CAD AND
TOMOSYNTHESIS
TECHNIQUE: Left screening digital craniocaudal and mediolateral oblique
mammograms were obtained. Left screening digital breast
tomosynthesis was performed. The images were evaluated with
computer-aided detection.

[L MLO synth-2D]
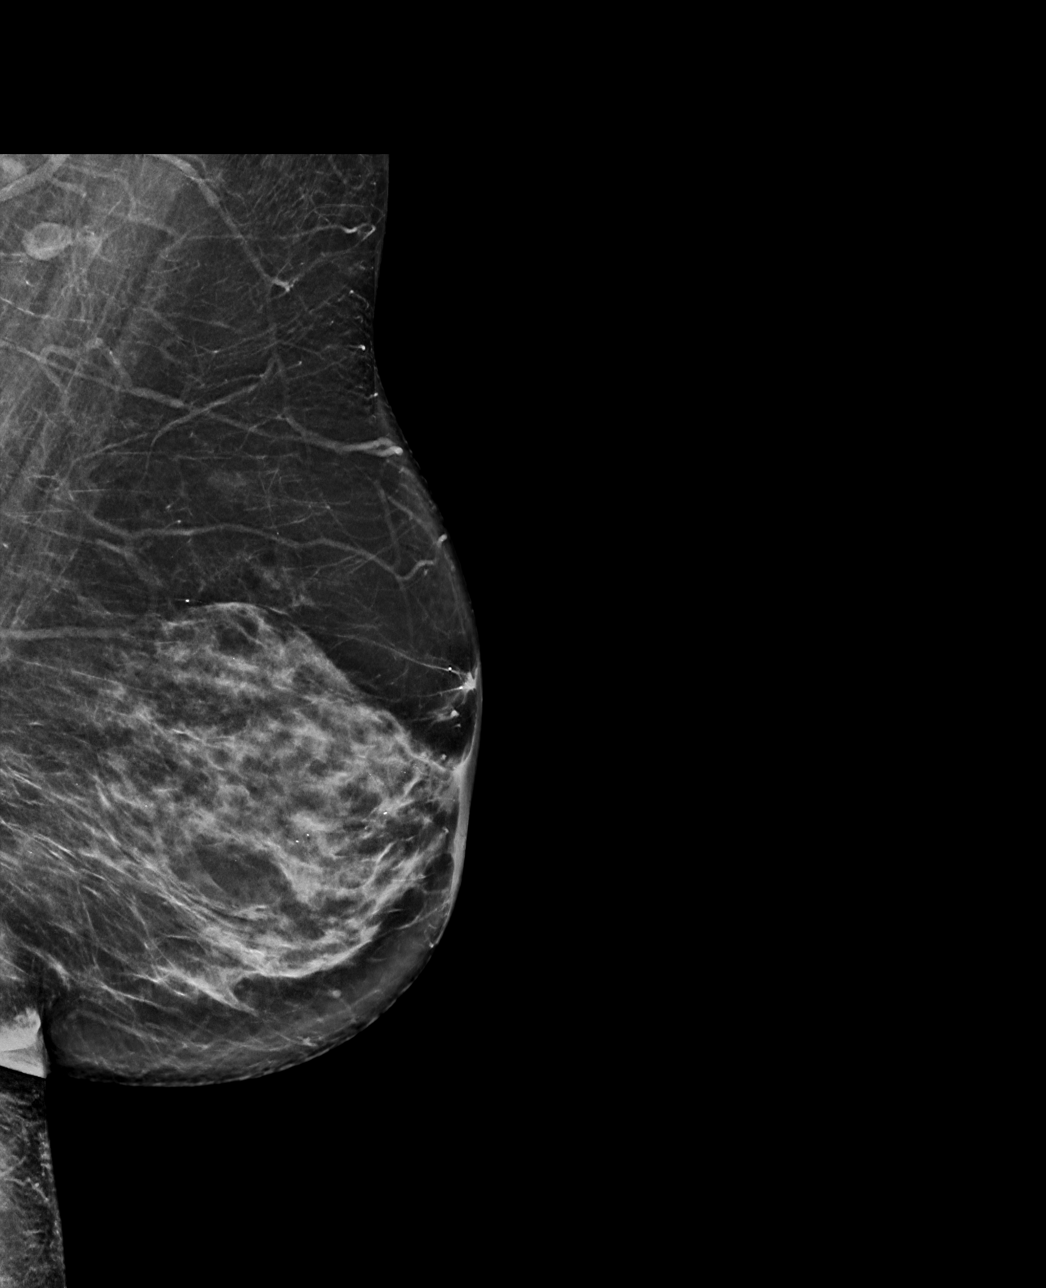

[L CC synth-2D]
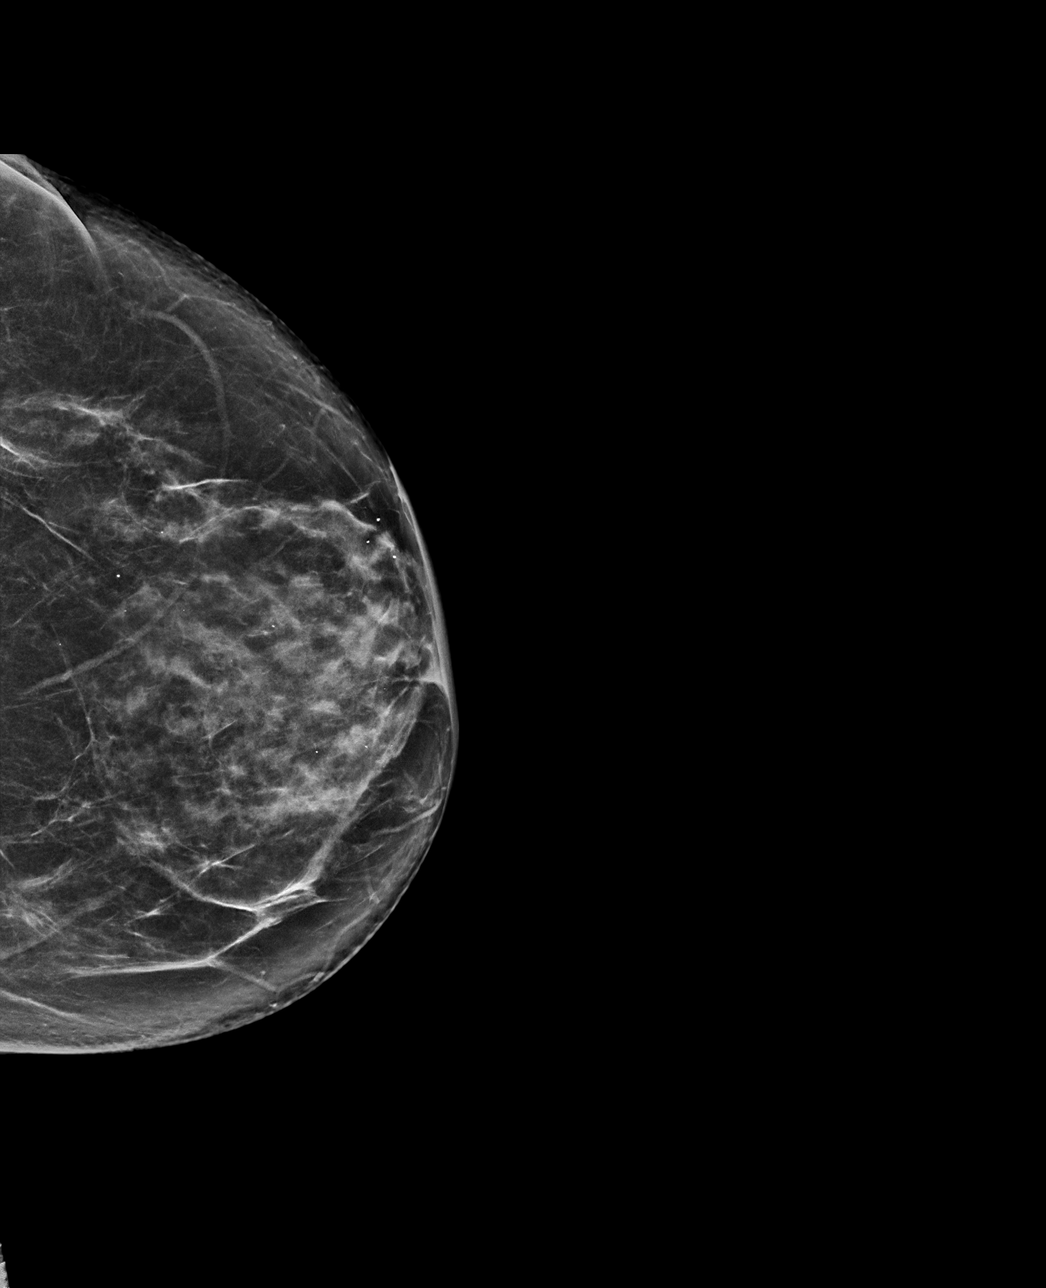

[L CC tomo · tomo slice 39/76.0]
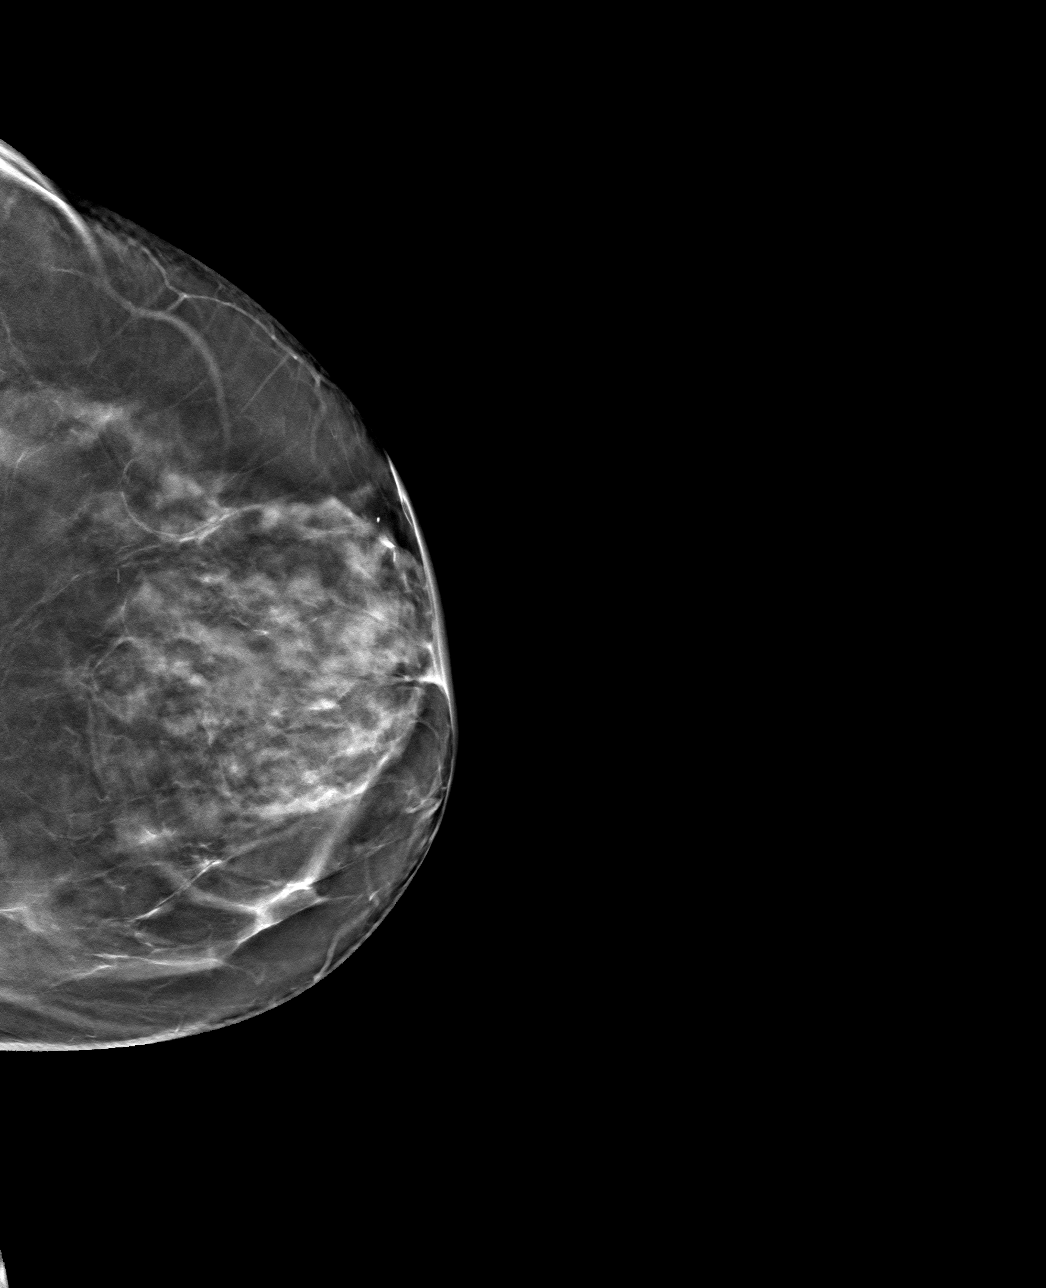

[L MLO tomo · tomo slice 41/81.0]
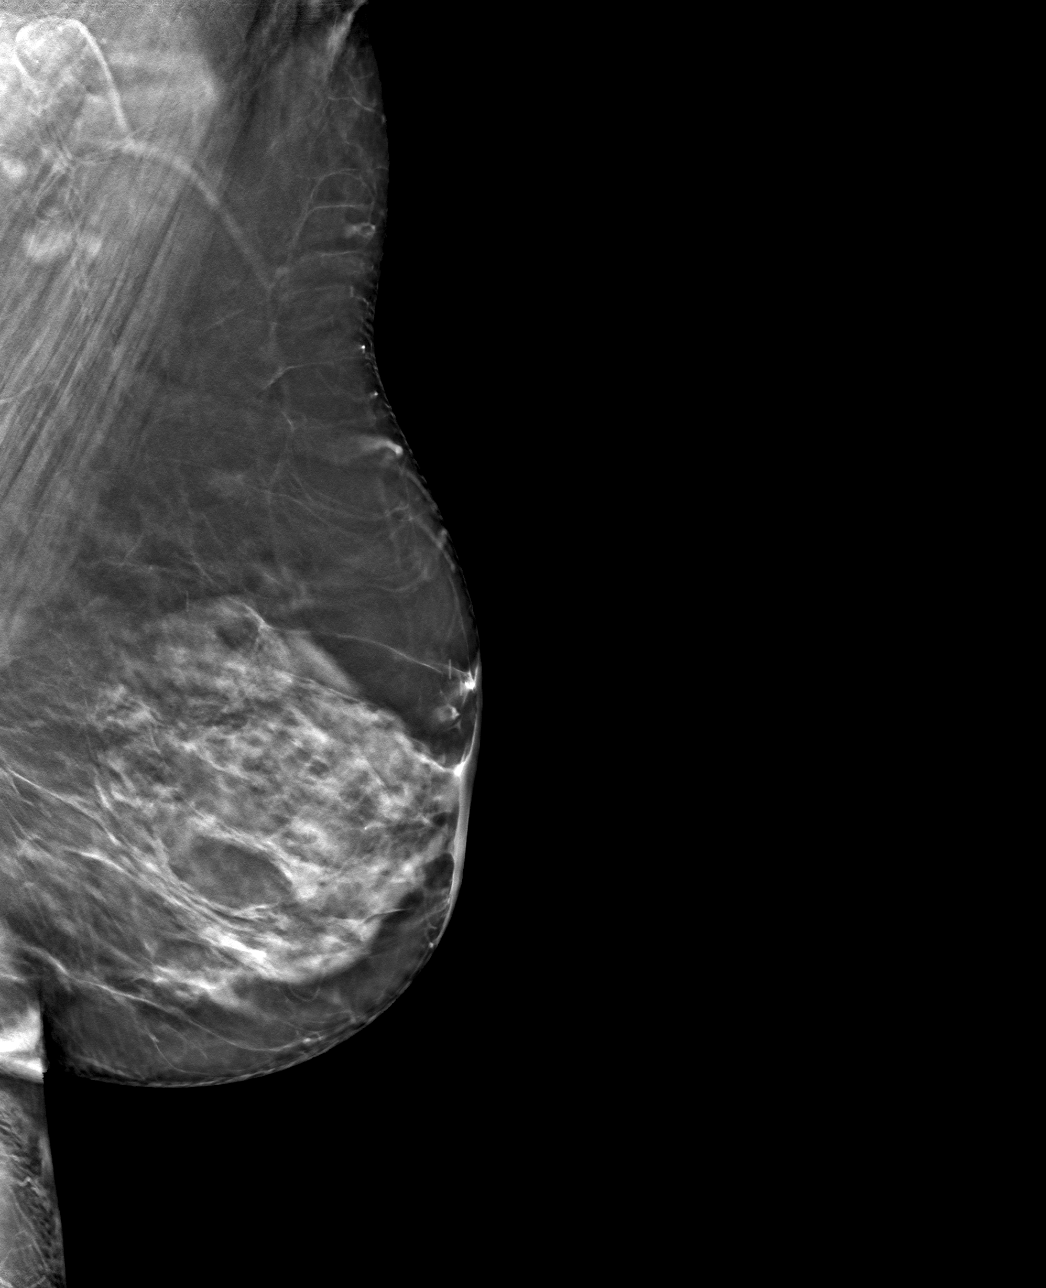

[4 of 12 positions shown; findings below may reference images not displayed]

ACR Breast Density Category d: The breast tissue is extremely dense,
which lowers the sensitivity of mammography
FINDINGS: There are no findings suspicious for malignancy.
IMPRESSION: No mammographic evidence of malignancy. A result letter of this
screening mammogram will be mailed directly to the patient.

RECOMMENDATION:
Screening mammogram in one year. (Code:[PG])

BI-RADS CATEGORY  1: Negative.

## 2020-12-31 ENCOUNTER — Other Ambulatory Visit: Payer: Self-pay | Admitting: Internal Medicine

## 2020-12-31 DIAGNOSIS — D0511 Intraductal carcinoma in situ of right breast: Secondary | ICD-10-CM

## 2021-01-16 ENCOUNTER — Other Ambulatory Visit (HOSPITAL_COMMUNITY): Payer: Self-pay | Admitting: Physician Assistant

## 2021-01-16 DIAGNOSIS — K828 Other specified diseases of gallbladder: Secondary | ICD-10-CM

## 2021-01-16 DIAGNOSIS — R1011 Right upper quadrant pain: Secondary | ICD-10-CM

## 2021-01-26 ENCOUNTER — Encounter (HOSPITAL_COMMUNITY)
Admission: RE | Admit: 2021-01-26 | Discharge: 2021-01-26 | Disposition: A | Discharge status: Home / Self Care | Payer: Medicare Other | Source: Ambulatory Visit | Attending: Physician Assistant | Admitting: Physician Assistant

## 2021-01-26 ENCOUNTER — Other Ambulatory Visit: Payer: Self-pay

## 2021-01-26 DIAGNOSIS — R1011 Right upper quadrant pain: Secondary | ICD-10-CM | POA: Insufficient documentation

## 2021-01-26 DIAGNOSIS — K828 Other specified diseases of gallbladder: Secondary | ICD-10-CM | POA: Diagnosis present

## 2021-01-26 IMAGING — NM NM HEPATO W/GB/PHARM/[PERSON_NAME]
2 series · 12 of 12 positions shown · non-contrast
Comparison: Similar study [DATE].

CLINICAL DATA: Biliary colic.

EXAM:
NUCLEAR MEDICINE HEPATOBILIARY IMAGING WITH GALLBLADDER EF
TECHNIQUE: Sequential images of the abdomen were obtained [DATE] minutes
following intravenous administration of radiopharmaceutical. After
oral ingestion of Ensure, gallbladder ejection fraction was
determined. At 60 min, normal ejection fraction is greater than 33%.
RADIOPHARMACEUTICALS:  4.8 mCi [Q4]  Choletec IV

[Series 1: hida scan · 3.28mm/px · 6 of 60 frames shown (1 of 2)]
[frame 6/60]
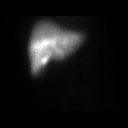
[frame 16/60]
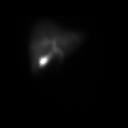
[frame 26/60]
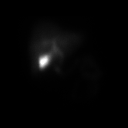
[frame 36/60]
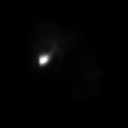
[frame 46/60]
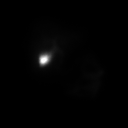
[frame 56/60]
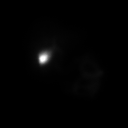

[Series 1: hida scan · 3.28mm/px · 6 of 60 frames shown (2 of 2)]
[frame 6/60]
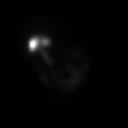
[frame 16/60]
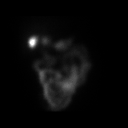
[frame 26/60]
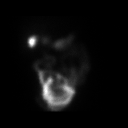
[frame 36/60]
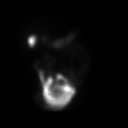
[frame 46/60]
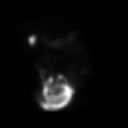
[frame 56/60]
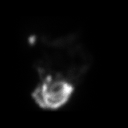

[12 of 12 positions shown; findings below may reference images not displayed]

FINDINGS: Prompt uptake and biliary excretion of activity by the liver is
seen. Gallbladder activity is visualized, consistent with patency of
cystic duct. Biliary activity passes into small bowel, consistent
with patent common bile duct.

Calculated gallbladder ejection fraction is 90%. (Normal gallbladder
ejection fraction with Ensure is greater than 33%.) The prior
gallbladder ejection fraction was 93%. The patient reported
subjective abdominal discomfort after drinking the Ensure.
IMPRESSION: 1. Normal gallbladder uptake and excretion with 93% ejection
fraction and prompt biliary transit to the bowel.
2. Patient reported abdominal discomfort after drinking the Ensure.

## 2021-01-26 MED ORDER — TECHNETIUM TC 99M MEBROFENIN IV KIT
4.8000 | PACK | Freq: Once | INTRAVENOUS | Status: AC
  Administered 2021-01-26: 4.8 via INTRAVENOUS

## 2021-02-10 ENCOUNTER — Other Ambulatory Visit: Payer: Self-pay | Admitting: Family Medicine

## 2021-02-10 DIAGNOSIS — N939 Abnormal uterine and vaginal bleeding, unspecified: Secondary | ICD-10-CM

## 2021-02-27 ENCOUNTER — Ambulatory Visit
Admission: RE | Admit: 2021-02-27 | Discharge: 2021-02-27 | Disposition: A | Discharge status: Home / Self Care | Payer: Medicare Other | Source: Ambulatory Visit | Attending: Family Medicine | Admitting: Family Medicine

## 2021-02-27 DIAGNOSIS — N939 Abnormal uterine and vaginal bleeding, unspecified: Secondary | ICD-10-CM

## 2021-02-27 IMAGING — US US PELVIS COMPLETE WITH TRANSVAGINAL
2 series · 13 of 25 positions shown · non-contrast
Comparison: None

CLINICAL DATA: Vaginal bleeding

EXAM:
TRANSABDOMINAL AND TRANSVAGINAL ULTRASOUND OF PELVIS
TECHNIQUE: Both transabdominal and transvaginal ultrasound examinations of the
pelvis were performed. Transabdominal technique was performed for
global imaging of the pelvis including uterus, ovaries, adnexal
regions, and pelvic cul-de-sac. It was necessary to proceed with
endovaginal exam following the transabdominal exam to visualize the
uterus endometrium ovaries.

[Series 1: us pelvis complete with transvaginal · 0.19mm/px · 6 of 49 slices shown (1 of 2)]
[im 1/49]
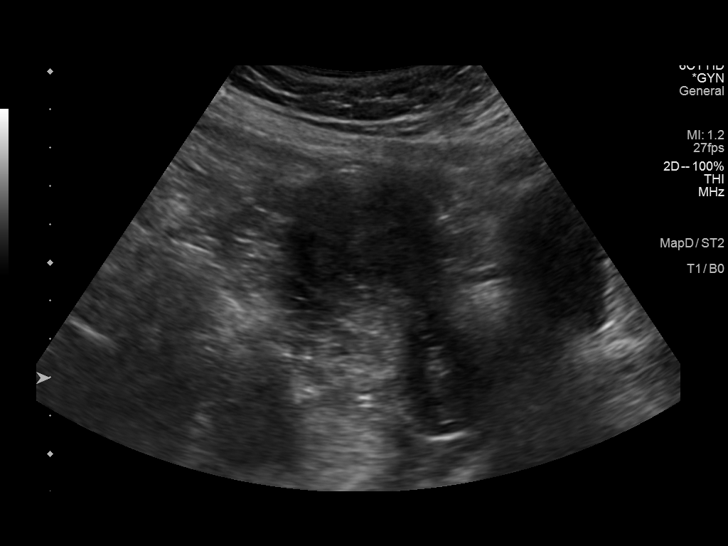
[im 9/49]
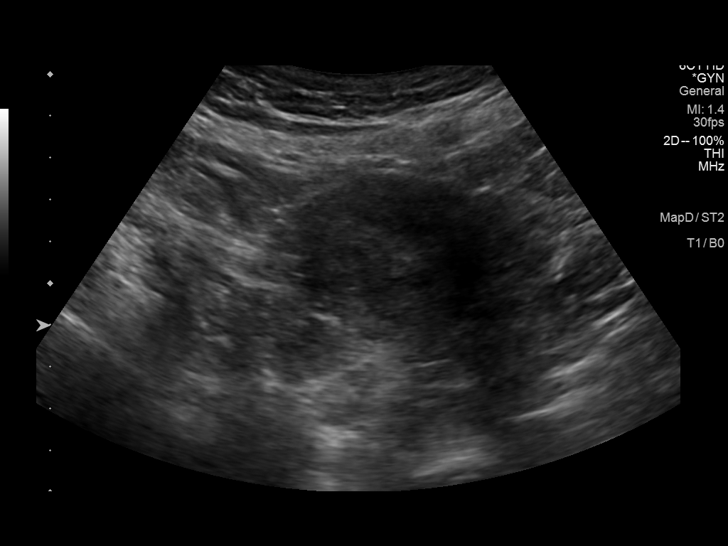
[im 18/49]
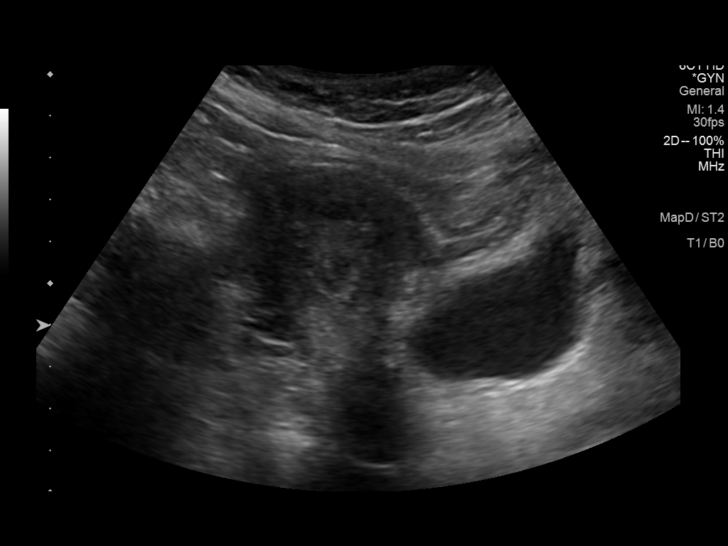
[im 27/49]
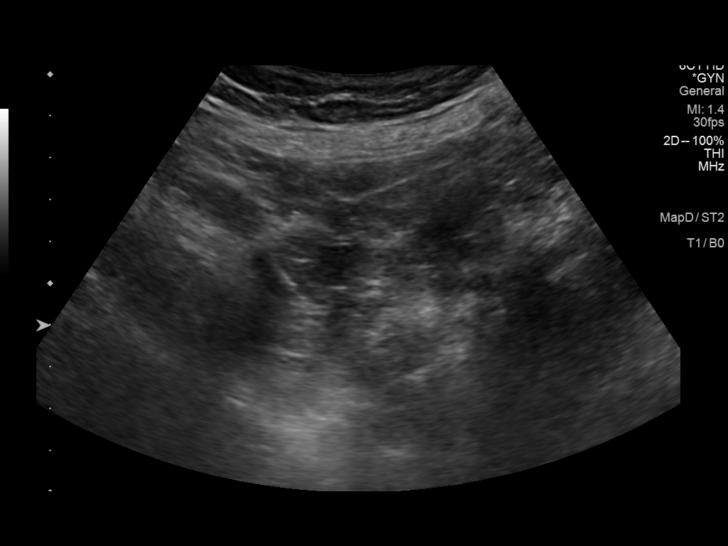
[im 35/49]
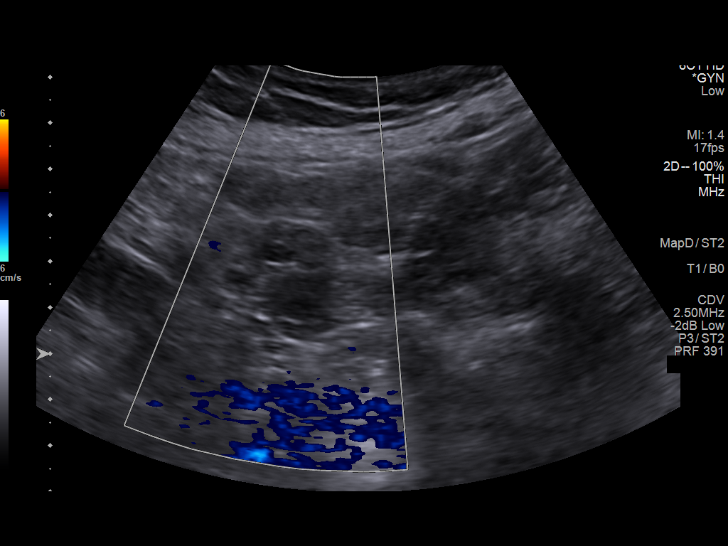
[im 44/49]
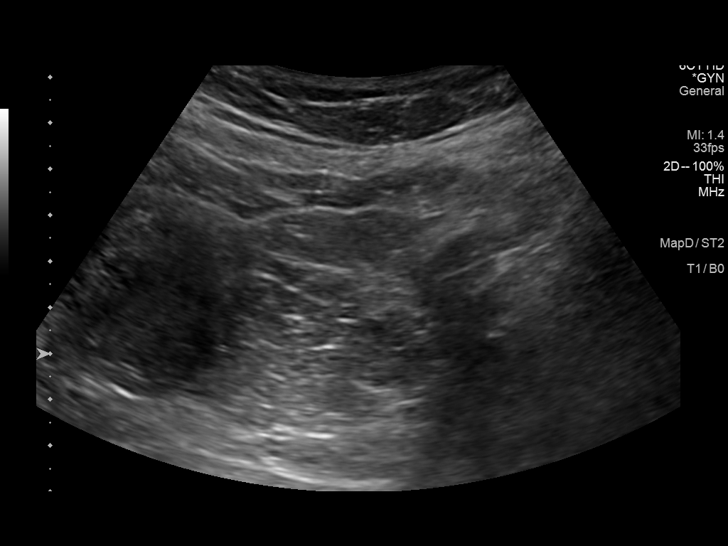

[Series 1001: us pelvis complete with transvaginal · 0.16mm/px · 7 of 56 slices shown (2 of 2)]
[im 1/56]
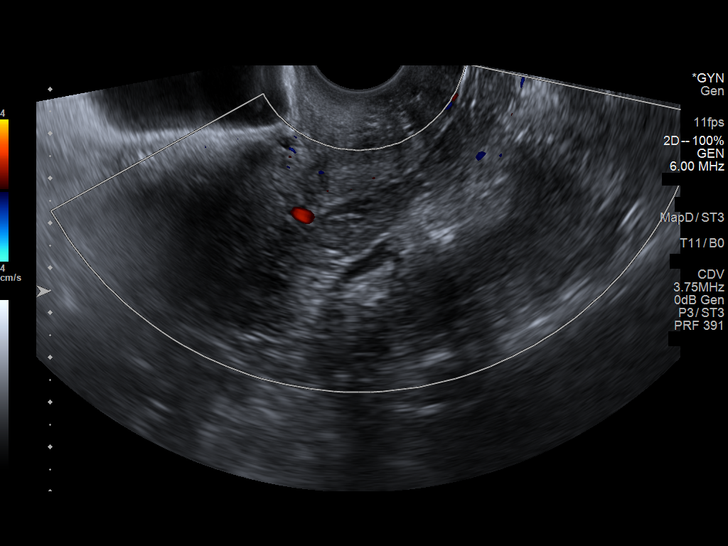
[im 10/56]
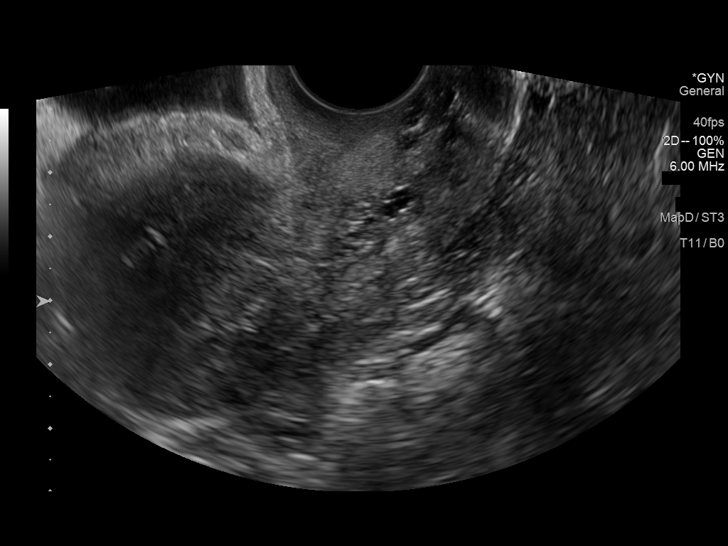
[im 19/56]
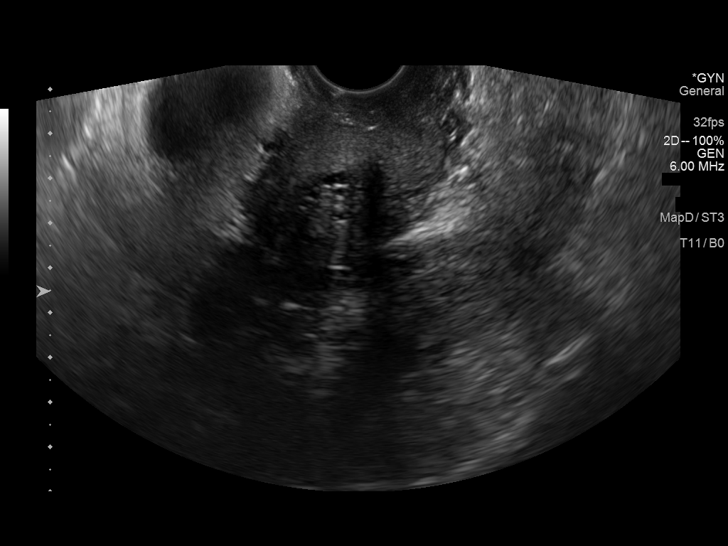
[im 28/56]
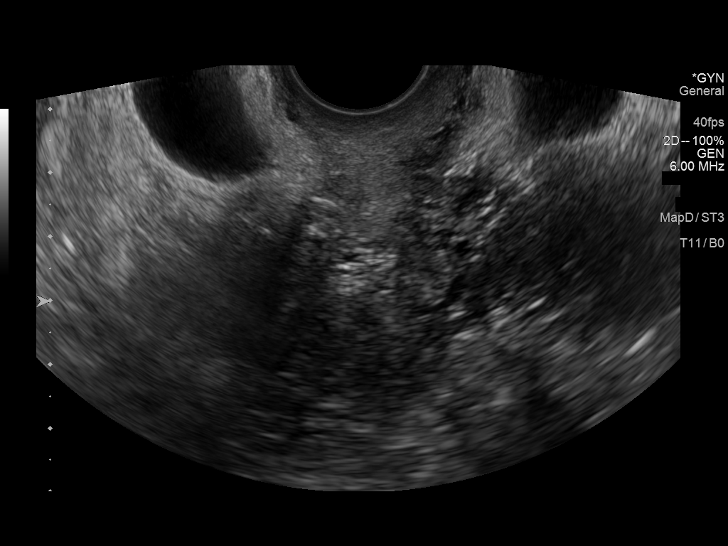
[im 37/56]
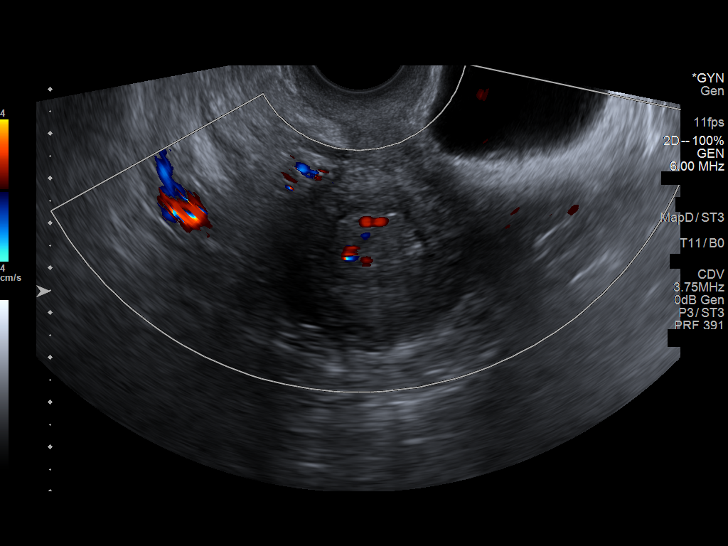
[im 46/56]
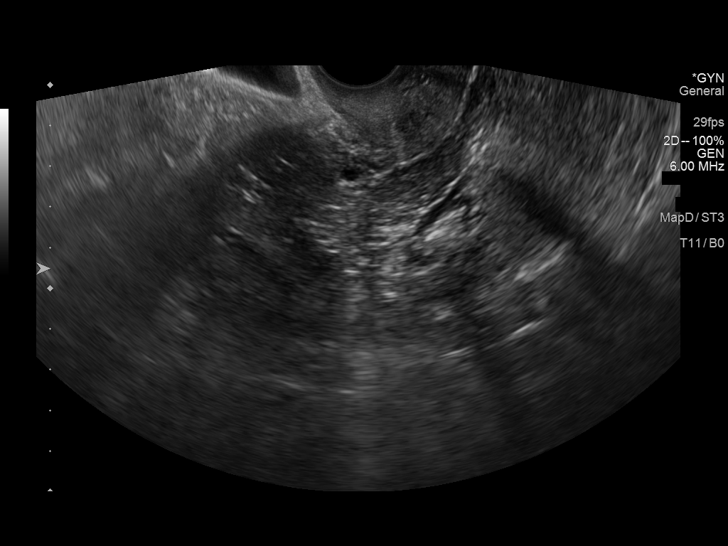
[im 56/56]
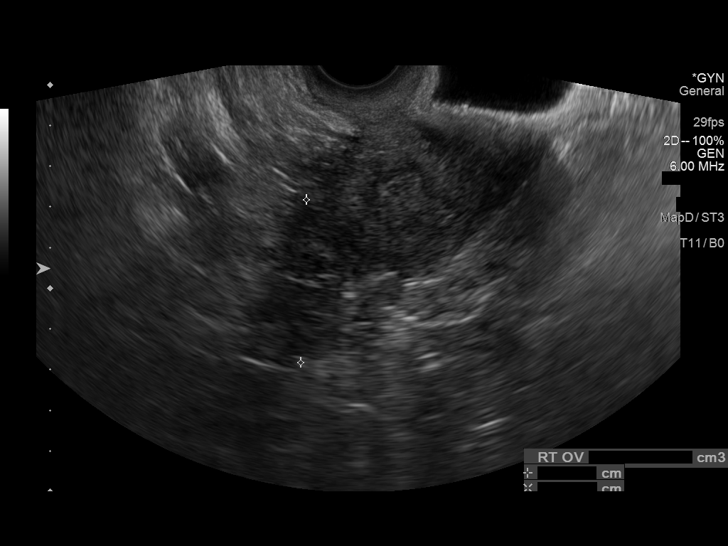

[13 of 25 positions shown; findings below may reference images not displayed]

FINDINGS: Uterus

Measurements: 7.7 by 4.6 x 5.6 cm = volume: 103 mL. No fibroids or
other mass visualized.

Endometrium

Thickness: 14 mm.  Masslike thickening of endometrium at the fundus.

Right ovary

Measurements: 4 x 2.1 x 2.3 cm = volume: 10.5 mL. Normal
appearance/no adnexal mass. Limited visibility

Left ovary

Measurements: 2.9 x 2 x 2.1 cm = volume: 6.2 mL. Normal
appearance/no adnexal mass.

Other findings

No abnormal free fluid.
IMPRESSION: 1. Thickened endometrium with possible 14 mm endometrial mass. In
the setting of post-menopausal bleeding, endometrial sampling is
indicated to exclude carcinoma. If results are benign,
sonohysterogram should be considered for focal lesion work-up prior
to hysteroscopy. (Ref: Radiological Reasoning: Algorithmic Workup of
Abnormal Vaginal Bleeding with Endovaginal Sonography and
Sonohysterography. AJR [UB]; 191:S68-73)

## 2021-03-01 ENCOUNTER — Encounter: Payer: Self-pay | Admitting: Internal Medicine

## 2021-03-17 ENCOUNTER — Other Ambulatory Visit: Payer: Self-pay

## 2021-03-23 ENCOUNTER — Encounter: Payer: Self-pay | Admitting: Gynecologic Oncology

## 2021-03-23 ENCOUNTER — Telehealth: Payer: Self-pay | Admitting: *Deleted

## 2021-03-23 NOTE — Telephone Encounter (Signed)
Spoke with the patient and scheduled a new patient appt with Dr Berline Lopes for 12/30 at 9 am; patient given the arrival time of 8:30 am. Patient aware of clinic address and phone number.

## 2021-03-23 NOTE — H&P (View-Only) (Signed)
GYNECOLOGIC ONCOLOGY NEW PATIENT CONSULTATION   Patient Name: [name]  Patient Age: 70 y.o. Date of Service: 03/24/21 Referring Provider: Burman Riis NP  Primary Care Provider: [name], MD Consulting Provider: Jeral Pinch, MD   Assessment/Plan:  Clinical stage I high grade endometrial cancer.   We reviewed the nature of endometrial cancer and its recommended surgical staging, including total hysterectomy, bilateral salpingo-oophorectomy, and lymph node assessment. The patient is a suitable candidate for staging via a minimally invasive approach to surgery.  We reviewed that robotic assistance would be used to complete the surgery.   We discussed that most endometrial cancer is detected early and that decisions regarding adjuvant therapy will be made based on her final pathology.  Given her high risk histology, I recommend CT scan preoperatively to rule out metastatic disease.  Spoke with pathology today.  Confirm that biopsy is mostly endometrioid, small component or features consistent with serous carcinoma.  We reviewed the sentinel lymph node technique. Risks and benefits of sentinel lymph node biopsy was reviewed. We reviewed the technique and ICG dye. The patient DOES NOT have an iodine allergy or known liver dysfunction. We reviewed the false negative rate (0.4%), and that 3% of patients with metastatic disease will not have it detected by SLN biopsy in endometrial cancer. A low risk of allergic reaction to the dye, <0.2% for ICG, has been reported. We also discussed that in the case of failed mapping, which occurs 40% of the time, a bilateral or unilateral lymphadenectomy will be performed at the surgeons discretion.   Potential benefits of sentinel nodes including a higher detection rate for metastasis due to ultrastaging and potential reduction in operative morbidity. However, there remains uncertainty as to the role for treatment of micrometastatic  disease. Further, the benefit of operative morbidity associated with the SLN technique in endometrial cancer is not yet completely known. In other patient populations (e.g. the cervical cancer population) there has been observed reductions in morbidity with SLN biopsy compared to pelvic lymphadenectomy. Lymphedema, nerve dysfunction and lymphocysts are all potential risks with the SLN technique as with complete lymphadenectomy. Additional risks to the patient include the risk of damage to an internal organ while operating in an altered view (e.g. the black and white image of the robotic fluorescence imaging mode).   We discussed the plan for a robotic assisted hysterectomy, bilateral salpingo-oophorectomy, sentinel lymph node evaluation, possible lymph node dissection, possible laparotomy. The risks of surgery were discussed in detail and she understands these to include infection; wound separation; hernia; vaginal cuff separation, injury to adjacent organs such as bowel, bladder, blood vessels, ureters and nerves; bleeding which may require blood transfusion; anesthesia risk; thromboembolic events; possible death; unforeseen complications; possible need for re-exploration; medical complications such as heart attack, stroke, pleural effusion and pneumonia; and, if full lymphadenectomy is performed the risk of lymphedema and lymphocyst. The patient will receive DVT and antibiotic prophylaxis as indicated. She voiced a clear understanding. She had the opportunity to ask questions. Perioperative instructions were reviewed with her. Prescriptions for post-op medications were sent to her pharmacy of choice.  Discussed risk factors in the setting of endometrioid cell type, related to excess estrogen likely in the setting of her tamoxifen history.  A copy of this note was sent to the patient's referring provider.   85 minutes of total time was spent for this patient encounter, including preparation, face-to-face  counseling with the patient and coordination of care, and documentation of the encounter.  Jeral Pinch, MD  Division of Gynecologic Oncology  Department of Obstetrics and Gynecology  [facility]  ___________________________________________  Chief Complaint: Chief Complaint  Patient presents with   Endometrial carcinoma [facility])    History of Present Illness:  [name] is a 70 y.o. y.o. female who is seen in consultation at the request of Burman Riis NP for an evaluation of endometrial cancer.  Patient began having vaginal spotting on November 10th.  She had 4 days of spotting which she describes as seeing red when she wiped and having a little brown discharge on her liner.  Bleeding then stopped completely.  She saw her primary care provider and underwent an ultrasound.  Ultrasound showed thickened endometrium measuring 14 mm.  She then started bleeding again on December 14 and had a week of bleeding with a little bit more red blood this time.  She ultimately saw OB/GYN and had an endometrial biopsy.  This was first attempted however secondary to cervical stenosis, patient returned the following day after using intravaginal misoprostol.  Endometrial biopsy on 12/22 revealed FIGO grade 2/3 endometrial cancer.  Patient reports minimal spotting after the biopsy that has stopped within the last couple of days.  She notes occasional cramping.  She has had decreased appetite overall secondary to anxiety, denies any nausea or emesis although has some mild intermittent nausea at baseline in the setting of her IBS.  With regard to her IBS, she tends towards having softer stools.  Denies any change from baseline.  She is currently recovering from a cold.  Notes some baseline urinary incontinence and frequency, no changes recently.  Retired 4 years ago.  Lives with her husband.  She has a history of lung cancer diagnosed in 2006 as well as recurrent ductal  carcinoma in situ of the right breast diagnosed in 2014.  She has been on tamoxifen since 07/2012. Has been off tamoxifen since vaginal bleeding started.  Treatment History: Oncology History  Lung cancer, diagnosed in March of 2006 stage II a status post left trisegmentectomy with lymph node dissection followed by 4 cycles of adjuvant chemotherapy (Resolved)  04/28/2012 Initial Diagnosis   Lung cancer, diagnosed in March of 2006 stage II a status post left trisegmentectomy with lymph node dissection followed by 4 cycles of adjuvant chemotherapy   05/06/2018 Genetic Testing   MEN1 c.875C>T and MSH3 c.1366G>A VUS identified on the multicancer gene panel.  The Multi-Gene Panel offered by [facility] includes sequencing and/or deletion duplication testing of the following 84 genes: AIP, ALK, APC, ATM, AXIN2,BAP1,  BARD1, BLM, BMPR1A, BRCA1, BRCA2, BRIP1, CASR, CDC73, CDH1, CDK4, CDKN1B, CDKN1C, CDKN2A (p14ARF), CDKN2A (p16INK4a), CEBPA, CHEK2, CTNNA1, DICER1, DIS3L2, EGFR (c.2369C>T, p.Thr790Met variant only), EPCAM (Deletion/duplication testing only), FH, FLCN, GATA2, GPC3, GREM1 (Promoter region deletion/duplication testing only), HOXB13 (c.251G>A, p.Gly84Glu), HRAS, KIT, MAX, MEN1, MET, MITF (c.952G>A, p.Glu318Lys variant only), MLH1, MSH2, MSH3, MSH6, MUTYH, NBN, NF1, NF2, NTHL1, PALB2, PDGFRA, PHOX2B, PMS2, POLD1, POLE, POT1, PRKAR1A, PTCH1, PTEN, RAD50, RAD51C, RAD51D, RB1, RECQL4, RET, RUNX1, SDHAF2, SDHA (sequence changes only), SDHB, SDHC, SDHD, SMAD4, SMARCA4, SMARCB1, SMARCE1, STK11, SUFU, TERC, TERT, TMEM127, TP53, TSC1, TSC2, VHL, WRN and WT1.  The report date is May 06, 2018.   Ductal carcinoma in situ of right breast diagnosed in December of 2013, status post right lumpectomy in January of 2014  04/28/2012 Initial Diagnosis   Ductal carcinoma in situ of right breast diagnosed in December of 2013, status post right lumpectomy in January of 2014  05/06/2018 Genetic Testing   MEN1 c.875C>T and  MSH3 c.1366G>A VUS identified on the multicancer gene panel.  The Multi-Gene Panel offered by [facility] includes sequencing and/or deletion duplication testing of the following 84 genes: AIP, ALK, APC, ATM, AXIN2,BAP1,  BARD1, BLM, BMPR1A, BRCA1, BRCA2, BRIP1, CASR, CDC73, CDH1, CDK4, CDKN1B, CDKN1C, CDKN2A (p14ARF), CDKN2A (p16INK4a), CEBPA, CHEK2, CTNNA1, DICER1, DIS3L2, EGFR (c.2369C>T, p.Thr790Met variant only), EPCAM (Deletion/duplication testing only), FH, FLCN, GATA2, GPC3, GREM1 (Promoter region deletion/duplication testing only), HOXB13 (c.251G>A, p.Gly84Glu), HRAS, KIT, MAX, MEN1, MET, MITF (c.952G>A, p.Glu318Lys variant only), MLH1, MSH2, MSH3, MSH6, MUTYH, NBN, NF1, NF2, NTHL1, PALB2, PDGFRA, PHOX2B, PMS2, POLD1, POLE, POT1, PRKAR1A, PTCH1, PTEN, RAD50, RAD51C, RAD51D, RB1, RECQL4, RET, RUNX1, SDHAF2, SDHA (sequence changes only), SDHB, SDHC, SDHD, SMAD4, SMARCA4, SMARCB1, SMARCE1, STK11, SUFU, TERC, TERT, TMEM127, TP53, TSC1, TSC2, VHL, WRN and WT1.  The report date is May 06, 2018.    PAST MEDICAL HISTORY:  Past Medical History:  Diagnosis Date   Anxiety    Breast cancer ([place])    2014 and 2015   Cancer ([place])    lung cancer    DDD (degenerative disc disease)    neck   Dry eye    Eczema    Family history of breast cancer    Family history of stomach cancer    GERD (gastroesophageal reflux disease)    occasional - no current med.   History of breast cancer    History of lung cancer    non-small cell - left upper lobe   IBS (irritable bowel syndrome)    Non-allergic rhinitis    sinusitis   Osteoarthritis    Personal history of radiation therapy 2014   PONV (postoperative nausea and vomiting)    Radiation 07/03/12-07/09/12   Right upper lung 5400 cGy   Sinus headache    occasional   Thyroid nodule, toxic or with hyperthyroidism    no current med.   Urinary, incontinence, stress female    Vasomotor rhinitis    Vertigo      PAST SURGICAL HISTORY:  Past Surgical  History:  Procedure Laterality Date   ANTERIOR CERVICAL DECOMP/DISCECTOMY FUSION N/A 12/22/2019   Procedure: Cervical four-five Cervical five-six Cervical six-seven Anterior cervical decompression/discectomy/fusion;  Surgeon: Erline Levine, MD;  Location: [place];  Service: Neurosurgery;  Laterality: N/A;   APPENDECTOMY  1971   BREAST LUMPECTOMY Right 2014   BREAST RECONSTRUCTION WITH PLACEMENT OF TISSUE EXPANDER AND FLEX HD (ACELLULAR HYDRATED DERMIS) Right 05/20/2013   Procedure: IMMEDIATE RIGHT BREAST RECONSTRUCTION WITH LATISSAMUS MUSCLE FLAP AND PLACEMENT OF TISSUE EXPANDER TO RIGHT BREAST;  Surgeon: Theodoro Kos, DO;  Location: [place];  Service: Plastics;  Laterality: Right;   BREAST REDUCTION WITH MASTOPEXY Left 10/22/2013   Procedure: BREAST REDUCTION/MASTOPEXY TO LEFT BREAST WITH LIPOSUCTION/FAT GRAFTING FOR SYMMETRY;  Surgeon: Theodoro Kos, DO;  Location: [place];  Service: Plastics;  Laterality: Left;   DE QUERVAIN'S RELEASE Left 07/31/2002   FRACTURE SURGERY Left 2019   Torn meniscus; bone fragments removed   ganglion cyst R hand     1980s   KNEE SURGERY Left    torn meniscus   LUNG REMOVAL, PARTIAL     2014   MASTECTOMY Right    2015   MASTECTOMY W/ SENTINEL NODE BIOPSY Right 05/20/2013   Procedure: RIGHT SIMPLEMASTECTOMY AND SENTINEL NODE   Sampling;  Surgeon: Joyice Faster. Cornett, MD;  Location: [place];  Service: General;  Laterality: Right;   MOUTH SURGERY Right may 2014   "  keratocystic odotogenic tumor" - X 2   NASAL SINUS SURGERY  ?1988-1990   "at least 3" (05/21/2013)   PARTIAL MASTECTOMY WITH NEEDLE LOCALIZATION  04/10/2012   Procedure: PARTIAL MASTECTOMY WITH NEEDLE LOCALIZATION;  Surgeon: Marcello Moores A. Cornett, MD;  Location: [place];  Service: General;  Laterality: Right;   REDUCTION MAMMAPLASTY Left 2015   REMOVAL OF TISSUE EXPANDER AND PLACEMENT OF IMPLANT Right 10/22/2013   Procedure: REMOVAL OF RIGHT TISSUE EXPANDER WITH PLACEMENT OF RIGHT  BREAST IMPLANT;  Surgeon: Theodoro Kos, DO;  Location: [place];  Service: Plastics;  Laterality: Right;   SEPTOPLASTY     & sinus surgeries 1980s-1990s   THORACOTOMY Left 07/14/2004   TRIGGER FINGER RELEASE Bilateral    "I've had OR on q finger" (05/21/2013)   TRIGGER FINGER RELEASE Right 07/14/1999   index and little fingers   TRIGGER FINGER RELEASE Right 07/17/2002   long and ring fingers   TRIGGER FINGER RELEASE Left 07/31/2002   little, ring and index fingers   VIDEO ASSISTED THORACOSCOPY (VATS)/ LOBECTOMY Left 07/14/2004   upper tri-segmentectomy   WISDOM TOOTH EXTRACTION     "3"    OB/GYN HISTORY:  OB History  Gravida Para Term Preterm AB Living  1 1          SAB IAB Ectopic Multiple Live Births               # Outcome Date GA Lbr Len/2nd Weight Sex Delivery Anes PTL Lv  1 Para             Obstetric Comments  First live birth age 24, no hrt    No LMP recorded. Patient is postmenopausal.  Age at menarche: 80 Age at menopause[age] Hx of HRT: Denies, though has been on tamoxifen Hx of STDs: Denies Last pap: 2016 History of abnormal pap smears: Denies  SCREENING STUDIES:  Last mammogram: 2022  Last colonoscopy: 2004 is the last documented in our system, is scheduled for repeat colonoscopy in February 2023  MEDICATIONS: Outpatient Encounter Medications as of 03/24/2021  Medication Sig   cycloSPORINE (RESTASIS) 0.05 % ophthalmic emulsion Place 1 drop into both eyes 2 (two) times daily.    esomeprazole (NEXIUM) 20 MG capsule Take by mouth.   fluocinonide gel (LIDEX) 8.29 % Apply 1 application topically daily as needed (canker sores).    fluticasone (FLONASE) 50 MCG/ACT nasal spray Place 2 sprays into both nostrils daily.    hyoscyamine (LEVSIN) 0.125 MG tablet 3 (three) times daily as needed.   ibuprofen (ADVIL) 600 MG tablet Take 1 tablet (600 mg total) by mouth every 6 (six) hours as needed for moderate pain. For AFTER surgery only   Lactobacillus  (ACIDOPHILUS PO) Take 5 mg by mouth daily.   MELOXICAM PO Take by mouth as needed.   Multiple Vitamin (MULTIVITAMIN) capsule Take 1 capsule by mouth in the morning.    oxyCODONE (OXY IR/ROXICODONE) 5 MG immediate release tablet Take 1 tablet (5 mg total) by mouth every 4 (four) hours as needed for severe pain. For AFTER surgery only, do not take and drive   Peppermint Oil (IBGARD) 90 MG CPCR 2 capsules   Polyethyl Glycol-Propyl Glycol (SYSTANE) 0.4-0.3 % SOLN 1 drop into affected eye as needed   senna-docusate (SENOKOT-S) 8.6-50 MG tablet Take 2 tablets by mouth at bedtime. For AFTER surgery, do not take if having diarrhea   vitamin E 400 UNIT capsule Take 400 Units by mouth daily.  acetaminophen (TYLENOL) 500 MG tablet Take 1,000 mg by mouth at bedtime as needed for moderate pain.  (Patient not taking: Reported on 03/23/2021)   cyclobenzaprine (FLEXERIL) 10 MG tablet Take 5-10 mg by mouth at bedtime as needed for muscle spasms.  (Patient not taking: Reported on 07/27/2020)   Olopatadine HCl 0.2 % SOLN 1 drop into affected eye (Patient not taking: Reported on 03/23/2021)   [DISCONTINUED] tamoxifen (NOLVADEX) 20 MG tablet TAKE 1 TABLET BY MOUTH EVERY DAY (Patient not taking: Reported on 03/23/2021)   No facility-administered encounter medications on file as of 03/24/2021.    ALLERGIES:  Allergies  Allergen Reactions   Morphine And Related Shortness Of Breath    Causes shallow breathing   Amoxicillin Hives   Asa [Aspirin] Other (See Comments)    GI UPSET   Banana Other (See Comments)    GI UPSET; POSITIVE ON ALLERGY TEST   Ceftin [Cefuroxime Axetil] Diarrhea   Citrus Other (See Comments)    **ORANGES**  POSITIVE ON ALLERGY TEST; GI UPSET   Lactose Other (See Comments)    GI UPSET   Lactose Intolerance (Gi) Other (See Comments)    GI UPSET   Orange Oil Diarrhea    POSITIVE ON ALLERGY TEST; GI UPSET   Penicillins Hives   Cefuroxime Diarrhea    Other reaction(s): Unknown    Clavulanic Acid    Other Other (See Comments)    ORANGES BANANAS LACTOSE INTOLERANCE    Tetanus-Diphth-Acell Pertussis     Other reaction(s): severe local reaction   Tape Rash and Other (See Comments)    SKIN REDNESS BANDAIDS     FAMILY HISTORY:  Family History  Problem Relation Age of Onset   Anesthesia problems Mother        post-op N/V   Thyroid nodules Mother    Other Mother        DDD, spinal stenosis   High blood pressure Mother    Hearing loss Mother    Leukemia Father        CML, d. 47s   High blood pressure Father    Hearing loss Father    Breast cancer Sister        BRCA testing in 2006 timeframe is neg   Thyroid nodules Sister    Thyroid nodules Sister    Hearing loss Brother    Breast cancer Maternal Aunt        dx >50   Thyroid nodules Maternal Aunt    Thyroid nodules Maternal Aunt    Hearing loss Paternal Aunt    Hearing loss Paternal Uncle    Stomach cancer Maternal Grandmother    Anesthesia problems Daughter        post-op N/V   Stomach cancer Maternal Great-grandmother        assumed stomach cancer   Prostate cancer Neg Hx    Pancreatic cancer Neg Hx    Endometrial cancer Neg Hx    Ovarian cancer Neg Hx    Colon cancer Neg Hx      SOCIAL HISTORY:  Social Connections: Not on file    REVIEW OF SYSTEMS:  + cough, incontinence, anxiety Denies appetite changes, fevers, chills, fatigue, unexplained weight changes. Denies hearing loss, neck lumps or masses, mouth sores, ringing in ears or voice changes. Denies wheezing.  Denies shortness of breath. Denies chest pain or palpitations. Denies leg swelling. Denies abdominal distention, pain, blood in stools, constipation, diarrhea, nausea, vomiting, or early satiety. Denies pain with intercourse, dysuria, frequency, hematuria.  Denies hot flashes, pelvic pain, vaginal bleeding or vaginal discharge.   Denies joint pain, back pain or muscle pain/cramps. Denies itching, rash, or wounds. Denies  dizziness, headaches, numbness or seizures. Denies swollen lymph nodes or glands, denies easy bruising or bleeding. Denies depression, confusion, or decreased concentration.  Physical Exam:  Vital Signs for this encounter:  Pulse 92, temperature (!) 97.3 F (36.3 C), resp. rate 20, weight 158 lb 9.6 oz (71.9 kg), SpO2 100 %. Body mass index is 27.22 kg/m. General: Alert, oriented, no acute distress.  HEENT: Normocephalic, atraumatic. Sclera anicteric.  Chest: Clear to auscultation bilaterally. No wheezes, rhonchi, or rales. Cardiovascular: Regular rate and rhythm, no murmurs, rubs, or gallops.  Abdomen: Obese. Normoactive bowel sounds. Soft, nondistended, nontender to palpation. No masses or hepatosplenomegaly appreciated. No palpable fluid wave.  Healed laparoscopic incisions. Extremities: Grossly normal range of motion. Warm, well perfused. No edema bilaterally.  Skin: No rashes or lesions.  Lymphatics: No cervical, supraclavicular, or inguinal adenopathy.  GU:  Normal external female genitalia.  No lesions. No discharge or bleeding.             Bladder/urethra:  No lesions or masses, well supported bladder             Vagina: Mildly atrophic, no lesions or masses.             Cervix: Normal appearing, no lesions.  Healing area from tenaculum site from recent biopsy.  Somewhat flush with the vaginal apex.             Uterus: Small, mobile, no parametrial involvement or nodularity.             Adnexa: No masses appreciated.  Rectal: Deferred.  LABORATORY AND RADIOLOGIC DATA:  Outside medical records were reviewed to synthesize the above history, along with the history and physical obtained during the visit.   Lab Results  Component Value Date   WBC 5.7 07/20/2020   HGB 12.2 07/20/2020   HCT 38.6 07/20/2020   PLT 266 07/20/2020   GLUCOSE 102 (H) 07/20/2020   ALT 11 07/20/2020   AST 18 07/20/2020   NA 143 07/20/2020   K 4.8 07/20/2020   CL 108 07/20/2020   CREATININE 0.80  07/20/2020   BUN 15 07/20/2020   CO2 26 07/20/2020   Pelvic ultrasound 12/5: IMPRESSION: 1. Thickened endometrium with possible 14 mm endometrial mass. In the setting of post-menopausal bleeding, endometrial sampling is indicated to exclude carcinoma. If results are benign, sonohysterogram should be considered for focal lesion work-up prior to hysteroscopy. (Ref: Radiological Reasoning: Algorithmic Workup of Abnormal Vaginal Bleeding with Endovaginal Sonography and Sonohysterography. AJR 2008; 967:R91-63)

## 2021-03-23 NOTE — Progress Notes (Signed)
GYNECOLOGIC ONCOLOGY NEW PATIENT CONSULTATION   Patient Name: [name]  Patient Age: 70 y.o. Date of Service: 03/24/21 Referring Provider: Burman Riis NP  Primary Care Provider: [name], MD Consulting Provider: Jeral Pinch, MD   Assessment/Plan:  Clinical stage I high grade endometrial cancer.   We reviewed the nature of endometrial cancer and its recommended surgical staging, including total hysterectomy, bilateral salpingo-oophorectomy, and lymph node assessment. The patient is a suitable candidate for staging via a minimally invasive approach to surgery.  We reviewed that robotic assistance would be used to complete the surgery.   We discussed that most endometrial cancer is detected early and that decisions regarding adjuvant therapy will be made based on her final pathology.  Given her high risk histology, I recommend CT scan preoperatively to rule out metastatic disease.  Spoke with pathology today.  Confirm that biopsy is mostly endometrioid, small component or features consistent with serous carcinoma.  We reviewed the sentinel lymph node technique. Risks and benefits of sentinel lymph node biopsy was reviewed. We reviewed the technique and ICG dye. The patient DOES NOT have an iodine allergy or known liver dysfunction. We reviewed the false negative rate (0.4%), and that 3% of patients with metastatic disease will not have it detected by SLN biopsy in endometrial cancer. A low risk of allergic reaction to the dye, <0.2% for ICG, has been reported. We also discussed that in the case of failed mapping, which occurs 40% of the time, a bilateral or unilateral lymphadenectomy will be performed at the surgeons discretion.   Potential benefits of sentinel nodes including a higher detection rate for metastasis due to ultrastaging and potential reduction in operative morbidity. However, there remains uncertainty as to the role for treatment of micrometastatic  disease. Further, the benefit of operative morbidity associated with the SLN technique in endometrial cancer is not yet completely known. In other patient populations (e.g. the cervical cancer population) there has been observed reductions in morbidity with SLN biopsy compared to pelvic lymphadenectomy. Lymphedema, nerve dysfunction and lymphocysts are all potential risks with the SLN technique as with complete lymphadenectomy. Additional risks to the patient include the risk of damage to an internal organ while operating in an altered view (e.g. the black and white image of the robotic fluorescence imaging mode).   We discussed the plan for a robotic assisted hysterectomy, bilateral salpingo-oophorectomy, sentinel lymph node evaluation, possible lymph node dissection, possible laparotomy. The risks of surgery were discussed in detail and she understands these to include infection; wound separation; hernia; vaginal cuff separation, injury to adjacent organs such as bowel, bladder, blood vessels, ureters and nerves; bleeding which may require blood transfusion; anesthesia risk; thromboembolic events; possible death; unforeseen complications; possible need for re-exploration; medical complications such as heart attack, stroke, pleural effusion and pneumonia; and, if full lymphadenectomy is performed the risk of lymphedema and lymphocyst. The patient will receive DVT and antibiotic prophylaxis as indicated. She voiced a clear understanding. She had the opportunity to ask questions. Perioperative instructions were reviewed with her. Prescriptions for post-op medications were sent to her pharmacy of choice.  Discussed risk factors in the setting of endometrioid cell type, related to excess estrogen likely in the setting of her tamoxifen history.  A copy of this note was sent to the patient's referring provider.   85 minutes of total time was spent for this patient encounter, including preparation, face-to-face  counseling with the patient and coordination of care, and documentation of the encounter.  Jeral Pinch, MD  Division of Gynecologic Oncology  Department of Obstetrics and Gynecology  [facility]  ___________________________________________  Chief Complaint: Chief Complaint  Patient presents with   Endometrial carcinoma [facility])    History of Present Illness:  [name] is a 70 y.o. y.o. female who is seen in consultation at the request of Burman Riis NP for an evaluation of endometrial cancer.  Patient began having vaginal spotting on November 10th.  She had 4 days of spotting which she describes as seeing red when she wiped and having a little brown discharge on her liner.  Bleeding then stopped completely.  She saw her primary care provider and underwent an ultrasound.  Ultrasound showed thickened endometrium measuring 14 mm.  She then started bleeding again on December 14 and had a week of bleeding with a little bit more red blood this time.  She ultimately saw OB/GYN and had an endometrial biopsy.  This was first attempted however secondary to cervical stenosis, patient returned the following day after using intravaginal misoprostol.  Endometrial biopsy on 12/22 revealed FIGO grade 2/3 endometrial cancer.  Patient reports minimal spotting after the biopsy that has stopped within the last couple of days.  She notes occasional cramping.  She has had decreased appetite overall secondary to anxiety, denies any nausea or emesis although has some mild intermittent nausea at baseline in the setting of her IBS.  With regard to her IBS, she tends towards having softer stools.  Denies any change from baseline.  She is currently recovering from a cold.  Notes some baseline urinary incontinence and frequency, no changes recently.  Retired 4 years ago.  Lives with her husband.  She has a history of lung cancer diagnosed in 2006 as well as recurrent ductal  carcinoma in situ of the right breast diagnosed in 2014.  She has been on tamoxifen since 07/2012. Has been off tamoxifen since vaginal bleeding started.  Treatment History: Oncology History  Lung cancer, diagnosed in March of 2006 stage II a status post left trisegmentectomy with lymph node dissection followed by 4 cycles of adjuvant chemotherapy (Resolved)  04/28/2012 Initial Diagnosis   Lung cancer, diagnosed in March of 2006 stage II a status post left trisegmentectomy with lymph node dissection followed by 4 cycles of adjuvant chemotherapy   05/06/2018 Genetic Testing   MEN1 c.875C>T and MSH3 c.1366G>A VUS identified on the multicancer gene panel.  The Multi-Gene Panel offered by [facility] includes sequencing and/or deletion duplication testing of the following 84 genes: AIP, ALK, APC, ATM, AXIN2,BAP1,  BARD1, BLM, BMPR1A, BRCA1, BRCA2, BRIP1, CASR, CDC73, CDH1, CDK4, CDKN1B, CDKN1C, CDKN2A (p14ARF), CDKN2A (p16INK4a), CEBPA, CHEK2, CTNNA1, DICER1, DIS3L2, EGFR (c.2369C>T, p.Thr790Met variant only), EPCAM (Deletion/duplication testing only), FH, FLCN, GATA2, GPC3, GREM1 (Promoter region deletion/duplication testing only), HOXB13 (c.251G>A, p.Gly84Glu), HRAS, KIT, MAX, MEN1, MET, MITF (c.952G>A, p.Glu318Lys variant only), MLH1, MSH2, MSH3, MSH6, MUTYH, NBN, NF1, NF2, NTHL1, PALB2, PDGFRA, PHOX2B, PMS2, POLD1, POLE, POT1, PRKAR1A, PTCH1, PTEN, RAD50, RAD51C, RAD51D, RB1, RECQL4, RET, RUNX1, SDHAF2, SDHA (sequence changes only), SDHB, SDHC, SDHD, SMAD4, SMARCA4, SMARCB1, SMARCE1, STK11, SUFU, TERC, TERT, TMEM127, TP53, TSC1, TSC2, VHL, WRN and WT1.  The report date is May 06, 2018.   Ductal carcinoma in situ of right breast diagnosed in December of 2013, status post right lumpectomy in January of 2014  04/28/2012 Initial Diagnosis   Ductal carcinoma in situ of right breast diagnosed in December of 2013, status post right lumpectomy in January of 2014  05/06/2018 Genetic Testing   MEN1 c.875C>T and  MSH3 c.1366G>A VUS identified on the multicancer gene panel.  The Multi-Gene Panel offered by [facility] includes sequencing and/or deletion duplication testing of the following 84 genes: AIP, ALK, APC, ATM, AXIN2,BAP1,  BARD1, BLM, BMPR1A, BRCA1, BRCA2, BRIP1, CASR, CDC73, CDH1, CDK4, CDKN1B, CDKN1C, CDKN2A (p14ARF), CDKN2A (p16INK4a), CEBPA, CHEK2, CTNNA1, DICER1, DIS3L2, EGFR (c.2369C>T, p.Thr790Met variant only), EPCAM (Deletion/duplication testing only), FH, FLCN, GATA2, GPC3, GREM1 (Promoter region deletion/duplication testing only), HOXB13 (c.251G>A, p.Gly84Glu), HRAS, KIT, MAX, MEN1, MET, MITF (c.952G>A, p.Glu318Lys variant only), MLH1, MSH2, MSH3, MSH6, MUTYH, NBN, NF1, NF2, NTHL1, PALB2, PDGFRA, PHOX2B, PMS2, POLD1, POLE, POT1, PRKAR1A, PTCH1, PTEN, RAD50, RAD51C, RAD51D, RB1, RECQL4, RET, RUNX1, SDHAF2, SDHA (sequence changes only), SDHB, SDHC, SDHD, SMAD4, SMARCA4, SMARCB1, SMARCE1, STK11, SUFU, TERC, TERT, TMEM127, TP53, TSC1, TSC2, VHL, WRN and WT1.  The report date is May 06, 2018.    PAST MEDICAL HISTORY:  Past Medical History:  Diagnosis Date   Anxiety    Breast cancer ([place])    2014 and 2015   Cancer ([place])    lung cancer    DDD (degenerative disc disease)    neck   Dry eye    Eczema    Family history of breast cancer    Family history of stomach cancer    GERD (gastroesophageal reflux disease)    occasional - no current med.   History of breast cancer    History of lung cancer    non-small cell - left upper lobe   IBS (irritable bowel syndrome)    Non-allergic rhinitis    sinusitis   Osteoarthritis    Personal history of radiation therapy 2014   PONV (postoperative nausea and vomiting)    Radiation 07/03/12-07/09/12   Right upper lung 5400 cGy   Sinus headache    occasional   Thyroid nodule, toxic or with hyperthyroidism    no current med.   Urinary, incontinence, stress female    Vasomotor rhinitis    Vertigo      PAST SURGICAL HISTORY:  Past Surgical  History:  Procedure Laterality Date   ANTERIOR CERVICAL DECOMP/DISCECTOMY FUSION N/A 12/22/2019   Procedure: Cervical four-five Cervical five-six Cervical six-seven Anterior cervical decompression/discectomy/fusion;  Surgeon: Erline Levine, MD;  Location: [place];  Service: Neurosurgery;  Laterality: N/A;   APPENDECTOMY  1971   BREAST LUMPECTOMY Right 2014   BREAST RECONSTRUCTION WITH PLACEMENT OF TISSUE EXPANDER AND FLEX HD (ACELLULAR HYDRATED DERMIS) Right 05/20/2013   Procedure: IMMEDIATE RIGHT BREAST RECONSTRUCTION WITH LATISSAMUS MUSCLE FLAP AND PLACEMENT OF TISSUE EXPANDER TO RIGHT BREAST;  Surgeon: Theodoro Kos, DO;  Location: [place];  Service: Plastics;  Laterality: Right;   BREAST REDUCTION WITH MASTOPEXY Left 10/22/2013   Procedure: BREAST REDUCTION/MASTOPEXY TO LEFT BREAST WITH LIPOSUCTION/FAT GRAFTING FOR SYMMETRY;  Surgeon: Theodoro Kos, DO;  Location: [place];  Service: Plastics;  Laterality: Left;   DE QUERVAIN'S RELEASE Left 07/31/2002   FRACTURE SURGERY Left 2019   Torn meniscus; bone fragments removed   ganglion cyst R hand     1980s   KNEE SURGERY Left    torn meniscus   LUNG REMOVAL, PARTIAL     2014   MASTECTOMY Right    2015   MASTECTOMY W/ SENTINEL NODE BIOPSY Right 05/20/2013   Procedure: RIGHT SIMPLEMASTECTOMY AND SENTINEL NODE   Sampling;  Surgeon: Joyice Faster. Cornett, MD;  Location: [place];  Service: General;  Laterality: Right;   MOUTH SURGERY Right may 2014   "  keratocystic odotogenic tumor" - X 2   NASAL SINUS SURGERY  ?1988-1990   "at least 3" (05/21/2013)   PARTIAL MASTECTOMY WITH NEEDLE LOCALIZATION  04/10/2012   Procedure: PARTIAL MASTECTOMY WITH NEEDLE LOCALIZATION;  Surgeon: Marcello Moores A. Cornett, MD;  Location: [place];  Service: General;  Laterality: Right;   REDUCTION MAMMAPLASTY Left 2015   REMOVAL OF TISSUE EXPANDER AND PLACEMENT OF IMPLANT Right 10/22/2013   Procedure: REMOVAL OF RIGHT TISSUE EXPANDER WITH PLACEMENT OF RIGHT  BREAST IMPLANT;  Surgeon: Theodoro Kos, DO;  Location: [place];  Service: Plastics;  Laterality: Right;   SEPTOPLASTY     & sinus surgeries 1980s-1990s   THORACOTOMY Left 07/14/2004   TRIGGER FINGER RELEASE Bilateral    "I've had OR on q finger" (05/21/2013)   TRIGGER FINGER RELEASE Right 07/14/1999   index and little fingers   TRIGGER FINGER RELEASE Right 07/17/2002   long and ring fingers   TRIGGER FINGER RELEASE Left 07/31/2002   little, ring and index fingers   VIDEO ASSISTED THORACOSCOPY (VATS)/ LOBECTOMY Left 07/14/2004   upper tri-segmentectomy   WISDOM TOOTH EXTRACTION     "3"    OB/GYN HISTORY:  OB History  Gravida Para Term Preterm AB Living  1 1          SAB IAB Ectopic Multiple Live Births               # Outcome Date GA Lbr Len/2nd Weight Sex Delivery Anes PTL Lv  1 Para             Obstetric Comments  First live birth age 50, no hrt    No LMP recorded. Patient is postmenopausal.  Age at menarche: 61 Age at menopause: [age] Hx of HRT: Denies, though has been on tamoxifen Hx of STDs: Denies Last pap: 2016 History of abnormal pap smears: Denies  SCREENING STUDIES:  Last mammogram: 2022  Last colonoscopy: 2004 is the last documented in our system, is scheduled for repeat colonoscopy in February 2023  MEDICATIONS: Outpatient Encounter Medications as of 03/24/2021  Medication Sig   cycloSPORINE (RESTASIS) 0.05 % ophthalmic emulsion Place 1 drop into both eyes 2 (two) times daily.    esomeprazole (NEXIUM) 20 MG capsule Take by mouth.   fluocinonide gel (LIDEX) 0.86 % Apply 1 application topically daily as needed (canker sores).    fluticasone (FLONASE) 50 MCG/ACT nasal spray Place 2 sprays into both nostrils daily.    hyoscyamine (LEVSIN) 0.125 MG tablet 3 (three) times daily as needed.   ibuprofen (ADVIL) 600 MG tablet Take 1 tablet (600 mg total) by mouth every 6 (six) hours as needed for moderate pain. For AFTER surgery only   Lactobacillus  (ACIDOPHILUS PO) Take 5 mg by mouth daily.   MELOXICAM PO Take by mouth as needed.   Multiple Vitamin (MULTIVITAMIN) capsule Take 1 capsule by mouth in the morning.    oxyCODONE (OXY IR/ROXICODONE) 5 MG immediate release tablet Take 1 tablet (5 mg total) by mouth every 4 (four) hours as needed for severe pain. For AFTER surgery only, do not take and drive   Peppermint Oil (IBGARD) 90 MG CPCR 2 capsules   Polyethyl Glycol-Propyl Glycol (SYSTANE) 0.4-0.3 % SOLN 1 drop into affected eye as needed   senna-docusate (SENOKOT-S) 8.6-50 MG tablet Take 2 tablets by mouth at bedtime. For AFTER surgery, do not take if having diarrhea   vitamin E 400 UNIT capsule Take 400 Units by mouth daily.  acetaminophen (TYLENOL) 500 MG tablet Take 1,000 mg by mouth at bedtime as needed for moderate pain.  (Patient not taking: Reported on 03/23/2021)   cyclobenzaprine (FLEXERIL) 10 MG tablet Take 5-10 mg by mouth at bedtime as needed for muscle spasms.  (Patient not taking: Reported on 07/27/2020)   Olopatadine HCl 0.2 % SOLN 1 drop into affected eye (Patient not taking: Reported on 03/23/2021)   [DISCONTINUED] tamoxifen (NOLVADEX) 20 MG tablet TAKE 1 TABLET BY MOUTH EVERY DAY (Patient not taking: Reported on 03/23/2021)   No facility-administered encounter medications on file as of 03/24/2021.    ALLERGIES:  Allergies  Allergen Reactions   Morphine And Related Shortness Of Breath    Causes shallow breathing   Amoxicillin Hives   Asa [Aspirin] Other (See Comments)    GI UPSET   Banana Other (See Comments)    GI UPSET; POSITIVE ON ALLERGY TEST   Ceftin [Cefuroxime Axetil] Diarrhea   Citrus Other (See Comments)    **ORANGES**  POSITIVE ON ALLERGY TEST; GI UPSET   Lactose Other (See Comments)    GI UPSET   Lactose Intolerance (Gi) Other (See Comments)    GI UPSET   Orange Oil Diarrhea    POSITIVE ON ALLERGY TEST; GI UPSET   Penicillins Hives   Cefuroxime Diarrhea    Other reaction(s): Unknown    Clavulanic Acid    Other Other (See Comments)    ORANGES BANANAS LACTOSE INTOLERANCE    Tetanus-Diphth-Acell Pertussis     Other reaction(s): severe local reaction   Tape Rash and Other (See Comments)    SKIN REDNESS BANDAIDS     FAMILY HISTORY:  Family History  Problem Relation Age of Onset   Anesthesia problems Mother        post-op N/V   Thyroid nodules Mother    Other Mother        DDD, spinal stenosis   High blood pressure Mother    Hearing loss Mother    Leukemia Father        CML, d. 95s   High blood pressure Father    Hearing loss Father    Breast cancer Sister        BRCA testing in 2006 timeframe is neg   Thyroid nodules Sister    Thyroid nodules Sister    Hearing loss Brother    Breast cancer Maternal Aunt        dx >50   Thyroid nodules Maternal Aunt    Thyroid nodules Maternal Aunt    Hearing loss Paternal Aunt    Hearing loss Paternal Uncle    Stomach cancer Maternal Grandmother    Anesthesia problems Daughter        post-op N/V   Stomach cancer Maternal Great-grandmother        assumed stomach cancer   Prostate cancer Neg Hx    Pancreatic cancer Neg Hx    Endometrial cancer Neg Hx    Ovarian cancer Neg Hx    Colon cancer Neg Hx      SOCIAL HISTORY:  Social Connections: Not on file    REVIEW OF SYSTEMS:  + cough, incontinence, anxiety Denies appetite changes, fevers, chills, fatigue, unexplained weight changes. Denies hearing loss, neck lumps or masses, mouth sores, ringing in ears or voice changes. Denies wheezing.  Denies shortness of breath. Denies chest pain or palpitations. Denies leg swelling. Denies abdominal distention, pain, blood in stools, constipation, diarrhea, nausea, vomiting, or early satiety. Denies pain with intercourse, dysuria, frequency, hematuria.  Denies hot flashes, pelvic pain, vaginal bleeding or vaginal discharge.   Denies joint pain, back pain or muscle pain/cramps. Denies itching, rash, or wounds. Denies  dizziness, headaches, numbness or seizures. Denies swollen lymph nodes or glands, denies easy bruising or bleeding. Denies depression, confusion, or decreased concentration.  Physical Exam:  Vital Signs for this encounter:  Pulse 92, temperature (!) 97.3 F (36.3 C), resp. rate 20, weight 158 lb 9.6 oz (71.9 kg), SpO2 100 %. Body mass index is 27.22 kg/m. General: Alert, oriented, no acute distress.  HEENT: Normocephalic, atraumatic. Sclera anicteric.  Chest: Clear to auscultation bilaterally. No wheezes, rhonchi, or rales. Cardiovascular: Regular rate and rhythm, no murmurs, rubs, or gallops.  Abdomen: Obese. Normoactive bowel sounds. Soft, nondistended, nontender to palpation. No masses or hepatosplenomegaly appreciated. No palpable fluid wave.  Healed laparoscopic incisions. Extremities: Grossly normal range of motion. Warm, well perfused. No edema bilaterally.  Skin: No rashes or lesions.  Lymphatics: No cervical, supraclavicular, or inguinal adenopathy.  GU:  Normal external female genitalia.  No lesions. No discharge or bleeding.             Bladder/urethra:  No lesions or masses, well supported bladder             Vagina: Mildly atrophic, no lesions or masses.             Cervix: Normal appearing, no lesions.  Healing area from tenaculum site from recent biopsy.  Somewhat flush with the vaginal apex.             Uterus: Small, mobile, no parametrial involvement or nodularity.             Adnexa: No masses appreciated.  Rectal: Deferred.  LABORATORY AND RADIOLOGIC DATA:  Outside medical records were reviewed to synthesize the above history, along with the history and physical obtained during the visit.   Lab Results  Component Value Date   WBC 5.7 07/20/2020   HGB 12.2 07/20/2020   HCT 38.6 07/20/2020   PLT 266 07/20/2020   GLUCOSE 102 (H) 07/20/2020   ALT 11 07/20/2020   AST 18 07/20/2020   NA 143 07/20/2020   K 4.8 07/20/2020   CL 108 07/20/2020   CREATININE 0.80  07/20/2020   BUN 15 07/20/2020   CO2 26 07/20/2020   Pelvic ultrasound 12/5: IMPRESSION: 1. Thickened endometrium with possible 14 mm endometrial mass. In the setting of post-menopausal bleeding, endometrial sampling is indicated to exclude carcinoma. If results are benign, sonohysterogram should be considered for focal lesion work-up prior to hysteroscopy. (Ref: Radiological Reasoning: Algorithmic Workup of Abnormal Vaginal Bleeding with Endovaginal Sonography and Sonohysterography. AJR 2008; 237:S28-31)

## 2021-03-24 ENCOUNTER — Encounter: Payer: Self-pay | Admitting: Gynecologic Oncology

## 2021-03-24 ENCOUNTER — Inpatient Hospital Stay (HOSPITAL_BASED_OUTPATIENT_CLINIC_OR_DEPARTMENT_OTHER): Payer: Medicare Other | Admitting: Gynecologic Oncology

## 2021-03-24 ENCOUNTER — Other Ambulatory Visit: Payer: Self-pay

## 2021-03-24 ENCOUNTER — Ambulatory Visit: Payer: Medicare Other

## 2021-03-24 ENCOUNTER — Inpatient Hospital Stay: Payer: Medicare Other | Attending: Gynecologic Oncology | Admitting: Gynecologic Oncology

## 2021-03-24 VITALS — HR 92 | Temp 97.3°F | Resp 20 | Wt 158.6 lb

## 2021-03-24 DIAGNOSIS — Z923 Personal history of irradiation: Secondary | ICD-10-CM | POA: Diagnosis not present

## 2021-03-24 DIAGNOSIS — Z9221 Personal history of antineoplastic chemotherapy: Secondary | ICD-10-CM | POA: Diagnosis not present

## 2021-03-24 DIAGNOSIS — Z85118 Personal history of other malignant neoplasm of bronchus and lung: Secondary | ICD-10-CM | POA: Diagnosis not present

## 2021-03-24 DIAGNOSIS — Z853 Personal history of malignant neoplasm of breast: Secondary | ICD-10-CM | POA: Diagnosis not present

## 2021-03-24 DIAGNOSIS — C541 Malignant neoplasm of endometrium: Secondary | ICD-10-CM

## 2021-03-24 MED ORDER — OXYCODONE HCL 5 MG PO TABS: 5.0000 mg | ORAL_TABLET | ORAL | 0 refills | Status: DC | PRN

## 2021-03-24 MED ORDER — SENNOSIDES-DOCUSATE SODIUM 8.6-50 MG PO TABS: 2.0000 | ORAL_TABLET | Freq: Every day | ORAL | 0 refills | Status: DC

## 2021-03-24 MED ORDER — IBUPROFEN 600 MG PO TABS: 600.0000 mg | ORAL_TABLET | Freq: Four times a day (QID) | ORAL | 0 refills | Status: DC | PRN

## 2021-03-24 NOTE — Progress Notes (Signed)
Patient here for new patient consultation with Dr. Jeral Pinch and for a pre-operative discussion prior to her scheduled surgery on April 11, 2021. She is scheduled for robotic assisted total laparoscopic hysterectomy, bilateral salpingo-oophorectomy, sentinel lymph node biopsy, possible lymph node dissection, possible laparotomy. The surgery was discussed in detail.  See after visit summary for additional details. Visual aids used to discuss items related to surgery including sequential compression stockings, foley catheter, IV pump, multi-modal pain regimen including tylenol, photo of the surgical robot, female reproductive system to discuss surgery in detail.      Discussed post-op pain management in detail including the aspects of the enhanced recovery pathway.  Advised her that a new prescription would be sent in for oxycodone and it is only to be used for after her upcoming surgery.  We discussed the use of tylenol post-op and to monitor for a maximum of 4,000 mg in a 24 hour period.  Also prescribed sennakot to be used after surgery and to hold if having loose stools.  Discussed bowel regimen in detail.     Discussed the use of heparin pre-op, SCDs, and measures to take at home to prevent DVT including frequent mobility.  Reportable signs and symptoms of DVT discussed. Post-operative instructions discussed and expectations for after surgery. Incisional care discussed as well including reportable signs and symptoms including erythema, drainage, wound separation.     10 minutes spent with the patient.  Verbalizing understanding of material discussed. No needs or concerns voiced at the end of the visit.   Advised patient to call for any needs.  Advised that her post-operative medications had been prescribed and could be picked up at any time. Information for CT scan discussed and contrast given.  This appointment is included in the global surgical bundle as pre-operative teaching and has no  charge.

## 2021-03-24 NOTE — Patient Instructions (Signed)
Plan to have a CT scan prior to surgery and [name] will release or call with the results. We are going to check a metabolic panel to evaluate your kidney function prior to giving you contrast for the CT scan. We are also checking a CA 125 level today which is a tumor marker.    Preparing for your Surgery   Plan for surgery on April 11, 2021 with Dr. Jeral Pinch at [facility] will be scheduled for robotic assisted total laparoscopic hysterectomy (removal of the uterus and cervix), bilateral salpingo-oophorectomy (removal of both ovaries and fallopian tubes), sentinel lymph node biopsy, possible lymph node dissection, possible laparotomy (larger incision on your abdomen if needed).    Pre-operative Testing -You will receive a phone call from presurgical testing at [facility] to arrange for a pre-operative appointment and lab work.   -Bring your insurance card, copy of an advanced directive if applicable, medication list   -At that visit, you will be asked to sign a consent for a possible blood transfusion in case a transfusion becomes necessary during surgery.  The need for a blood transfusion is rare but having consent is a necessary part of your care.      -You should not be taking blood thinners or aspirin at least ten days prior to surgery unless instructed by your surgeon.   -Do not take supplements such as fish oil (omega 3), red yeast rice, turmeric before your surgery. You want to avoid medications with aspirin in them including headache powders such as BC or Goody's), Excedrin migraine.   Day Before Surgery at [place] will be asked to take in a light diet the day before surgery. You will be advised you can have clear liquids up until 3 hours before your surgery.     Eat a light diet the day before surgery.  Examples including soups, broths, toast, yogurt, mashed potatoes.  AVOID GAS PRODUCING FOODS. Things to avoid include carbonated beverages (fizzy  beverages, sodas), raw fruits and raw vegetables (uncooked), or beans.    If your bowels are filled with gas, your surgeon will have difficulty visualizing your pelvic organs which increases your surgical risks.   Your role in recovery Your role is to become active as soon as directed by your doctor, while still giving yourself time to heal.  Rest when you feel tired. You will be asked to do the following in order to speed your recovery:   - Cough and breathe deeply. This helps to clear and expand your lungs and can prevent pneumonia after surgery.  - [place]. Do mild physical activity. Walking or moving your legs help your circulation and body functions return to normal. Do not try to get up or walk alone the first time after surgery.   -If you develop swelling on one leg or the other, pain in the back of your leg, redness/warmth in one of your legs, please call the office or go to the Emergency Room to have a doppler to rule out a blood clot. For shortness of breath, chest pain-seek care in the Emergency Room as soon as possible. - Actively manage your pain. Managing your pain lets you move in comfort. We will ask you to rate your pain on a scale of zero to 10. It is your responsibility to tell your doctor or nurse where and how much you hurt so your pain can be treated.   Special Considerations -If you  are diabetic, you may be placed on insulin after surgery to have closer control over your blood sugars to promote healing and recovery.  This does not mean that you will be discharged on insulin.  If applicable, your oral antidiabetics will be resumed when you are tolerating a solid diet.   -Your final pathology results from surgery should be available around one week after surgery and the results will be relayed to you when available.   -Dr. Lahoma Crocker is the surgeon that assists your GYN Oncologist with surgery.  If you end up staying the night, the next day after  your surgery you will either see [name] or Dr. Lahoma Crocker.   -FMLA forms can be faxed to [phone] and please allow 5-7 business days for completion.   Pain Management After Surgery -You have been prescribed your pain medication (oxycodone) and bowel regimen medications before surgery so that you can have these available when you are discharged from the hospital. The pain medication is for use ONLY AFTER surgery and a new prescription will not be given.    -Make sure that you have Tylenol and Ibuprofen at home to use on a regular basis after surgery for pain control. We recommend alternating the medications every hour to six hours since they work differently and are processed in the body differently for pain relief.   -Review the attached handout on narcotic use and their risks and side effects.    Bowel Regimen -You have been prescribed Sennakot-S to take nightly to prevent constipation especially if you are taking the narcotic pain medication intermittently.  It is important to prevent constipation and drink adequate amounts of liquids. You can stop taking this medication when you are not taking pain medication and you are back on your normal bowel routine.   Risks of Surgery Risks of surgery are low but include bleeding, infection, damage to surrounding structures, re-operation, blood clots, and very rarely death.     Blood Transfusion Information (For the consent to be signed before surgery)   We will be checking your blood type before surgery so in case of emergencies, we will know what type of blood you would need.                                             WHAT IS A BLOOD TRANSFUSION?   A transfusion is the replacement of blood or some of its parts. Blood is made up of multiple cells which provide different functions. Red blood cells carry oxygen and are used for blood loss replacement. White blood cells fight against infection. Platelets control bleeding. Plasma  helps clot blood. Other blood products are available for specialized needs, such as hemophilia or other clotting disorders. BEFORE THE TRANSFUSION  Who gives blood for transfusions?  You may be able to donate blood to be used at a later date on yourself (autologous donation). Relatives can be asked to donate blood. This is generally not any safer than if you have received blood from a stranger. The same precautions are taken to ensure safety when a relative's blood is donated. Healthy volunteers who are fully evaluated to make sure their blood is safe. This is blood bank blood. Transfusion therapy is the safest it has ever been in the practice of medicine. Before blood is taken from a donor, a complete history is taken to make  sure that person has no history of diseases nor engages in risky social behavior (examples are intravenous drug use or sexual activity with multiple partners). The donor's travel history is screened to minimize risk of transmitting infections, such as malaria. The donated blood is tested for signs of infectious diseases, such as HIV and hepatitis. The blood is then tested to be sure it is compatible with you in order to minimize the chance of a transfusion reaction. If you or a relative donates blood, this is often done in anticipation of surgery and is not appropriate for emergency situations. It takes many days to process the donated blood. RISKS AND COMPLICATIONS Although transfusion therapy is very safe and saves many lives, the main dangers of transfusion include:  Getting an infectious disease. Developing a transfusion reaction. This is an allergic reaction to something in the blood you were given. Every precaution is taken to prevent this. The decision to have a blood transfusion has been considered carefully by your caregiver before blood is given. Blood is not given unless the benefits outweigh the risks.   AFTER SURGERY INSTRUCTIONS   Return to work: 4-6 weeks if  applicable   Activity: 1. Be up and out of the bed during the day.  Take a nap if needed.  You may walk up steps but be careful and use the hand rail.  Stair climbing will tire you more than you think, you may need to stop part way and rest.    2. No lifting or straining for 6 weeks over 10 pounds. No pushing, pulling, straining for 6 weeks.   3. No driving for 1 week(s).  Do not drive if you are taking narcotic pain medicine and make sure that your reaction time has returned.    4. You can shower as soon as the next day after surgery. Shower daily.  Use your regular soap and water (not directly on the incision) and pat your incision(s) dry afterwards; don't rub.  No tub baths or submerging your body in water until cleared by your surgeon. If you have the soap that was given to you by pre-surgical testing that was used before surgery, you do not need to use it afterwards because this can irritate your incisions.    5. No sexual activity and nothing in the vagina for 8 weeks.   6. You may experience a small amount of clear drainage from your incisions, which is normal.  If the drainage persists, increases, or changes color please call the office.   7. Do not use creams, lotions, or ointments such as neosporin on your incisions after surgery until advised by your surgeon because they can cause removal of the dermabond glue on your incisions.     8. You may experience vaginal spotting after surgery or around the 6-8 week mark from surgery when the stitches at the top of the vagina begin to dissolve.  The spotting is normal but if you experience heavy bleeding, call our office.   9. Take Tylenol or ibuprofen first for pain and only use narcotic pain medication for severe pain not relieved by the Tylenol or Ibuprofen.  Monitor your Tylenol intake to a max of 4,000 mg in a 24 hour period. You can alternate these medications after surgery.   Diet: 1. Low sodium Heart Healthy Diet is recommended but  you are cleared to resume your normal (before surgery) diet after your procedure.   2. It is safe to use a laxative, such as  Miralax or Colace, if you have difficulty moving your bowels. You have been prescribed Sennakot-S to take at bedtime every evening after surgery to keep bowel movements regular and to prevent constipation.     Wound Care: 1. Keep clean and dry.  Shower daily.   Reasons to call the Doctor: Fever - Oral temperature greater than 100.4 degrees Fahrenheit Foul-smelling vaginal discharge Difficulty urinating Nausea and vomiting Increased pain at the site of the incision that is unrelieved with pain medicine. Difficulty breathing with or without chest pain New calf pain especially if only on one side Sudden, continuing increased vaginal bleeding with or without clots.   Contacts: For questions or concerns you should contact:   Dr. Jeral Pinch at [phone]   Joylene John, NP at [phone]   After Hours: call [phone] and have the GYN Oncologist paged/contacted (after 5 pm or on the weekends).   Messages sent via mychart are for non-urgent matters and are not responded to after hours so for urgent needs, please call the after hours number.

## 2021-03-24 NOTE — Patient Instructions (Signed)
Plan to have a CT scan prior to surgery and [name] will release or call with the results. We are going to check a metabolic panel to evaluate your kidney function prior to giving you contrast for the CT scan. We are also checking a CA 125 level today which is a tumor marker.   Preparing for your Surgery  Plan for surgery on April 11, 2021 with Dr. Jeral Pinch at [place] will be scheduled for robotic assisted total laparoscopic hysterectomy (removal of the uterus and cervix), bilateral salpingo-oophorectomy (removal of both ovaries and fallopian tubes), sentinel lymph node biopsy, possible lymph node dissection, possible laparotomy (larger incision on your abdomen if needed).   Pre-operative Testing -You will receive a phone call from presurgical testing at [facility] to arrange for a pre-operative appointment and lab work.  -Bring your insurance card, copy of an advanced directive if applicable, medication list  -At that visit, you will be asked to sign a consent for a possible blood transfusion in case a transfusion becomes necessary during surgery.  The need for a blood transfusion is rare but having consent is a necessary part of your care.     -You should not be taking blood thinners or aspirin at least ten days prior to surgery unless instructed by your surgeon.  -Do not take supplements such as fish oil (omega 3), red yeast rice, turmeric before your surgery. You want to avoid medications with aspirin in them including headache powders such as BC or Goody's), Excedrin migraine.  Day Before Surgery at [place] will be asked to take in a light diet the day before surgery. You will be advised you can have clear liquids up until 3 hours before your surgery.    Eat a light diet the day before surgery.  Examples including soups, broths, toast, yogurt, mashed potatoes.  AVOID GAS PRODUCING FOODS. Things to avoid include carbonated beverages (fizzy beverages,  sodas), raw fruits and raw vegetables (uncooked), or beans.   If your bowels are filled with gas, your surgeon will have difficulty visualizing your pelvic organs which increases your surgical risks.  Your role in recovery Your role is to become active as soon as directed by your doctor, while still giving yourself time to heal.  Rest when you feel tired. You will be asked to do the following in order to speed your recovery:  - Cough and breathe deeply. This helps to clear and expand your lungs and can prevent pneumonia after surgery.  - [place]. Do mild physical activity. Walking or moving your legs help your circulation and body functions return to normal. Do not try to get up or walk alone the first time after surgery.   -If you develop swelling on one leg or the other, pain in the back of your leg, redness/warmth in one of your legs, please call the office or go to the Emergency Room to have a doppler to rule out a blood clot. For shortness of breath, chest pain-seek care in the Emergency Room as soon as possible. - Actively manage your pain. Managing your pain lets you move in comfort. We will ask you to rate your pain on a scale of zero to 10. It is your responsibility to tell your doctor or nurse where and how much you hurt so your pain can be treated.  Special Considerations -If you are diabetic, you may be placed on insulin after surgery to have closer  control over your blood sugars to promote healing and recovery.  This does not mean that you will be discharged on insulin.  If applicable, your oral antidiabetics will be resumed when you are tolerating a solid diet.  -Your final pathology results from surgery should be available around one week after surgery and the results will be relayed to you when available.  -Dr. Lahoma Crocker is the surgeon that assists your GYN Oncologist with surgery.  If you end up staying the night, the next day after your surgery you  will either see [name] or Dr. Lahoma Crocker.  -FMLA forms can be faxed to [phone] and please allow 5-7 business days for completion.  Pain Management After Surgery -You have been prescribed your pain medication (oxycodone) and bowel regimen medications before surgery so that you can have these available when you are discharged from the hospital. The pain medication is for use ONLY AFTER surgery and a new prescription will not be given.   -Make sure that you have Tylenol and Ibuprofen at home to use on a regular basis after surgery for pain control. We recommend alternating the medications every hour to six hours since they work differently and are processed in the body differently for pain relief.  -Review the attached handout on narcotic use and their risks and side effects.   Bowel Regimen -You have been prescribed Sennakot-S to take nightly to prevent constipation especially if you are taking the narcotic pain medication intermittently.  It is important to prevent constipation and drink adequate amounts of liquids. You can stop taking this medication when you are not taking pain medication and you are back on your normal bowel routine.  Risks of Surgery Risks of surgery are low but include bleeding, infection, damage to surrounding structures, re-operation, blood clots, and very rarely death.   Blood Transfusion Information (For the consent to be signed before surgery)  We will be checking your blood type before surgery so in case of emergencies, we will know what type of blood you would need.                                            WHAT IS A BLOOD TRANSFUSION?  A transfusion is the replacement of blood or some of its parts. Blood is made up of multiple cells which provide different functions. Red blood cells carry oxygen and are used for blood loss replacement. White blood cells fight against infection. Platelets control bleeding. Plasma helps clot blood. Other blood  products are available for specialized needs, such as hemophilia or other clotting disorders. BEFORE THE TRANSFUSION  Who gives blood for transfusions?  You may be able to donate blood to be used at a later date on yourself (autologous donation). Relatives can be asked to donate blood. This is generally not any safer than if you have received blood from a stranger. The same precautions are taken to ensure safety when a relative's blood is donated. Healthy volunteers who are fully evaluated to make sure their blood is safe. This is blood bank blood. Transfusion therapy is the safest it has ever been in the practice of medicine. Before blood is taken from a donor, a complete history is taken to make sure that person has no history of diseases nor engages in risky social behavior (examples are intravenous drug use or sexual activity with multiple partners). The  donor's travel history is screened to minimize risk of transmitting infections, such as malaria. The donated blood is tested for signs of infectious diseases, such as HIV and hepatitis. The blood is then tested to be sure it is compatible with you in order to minimize the chance of a transfusion reaction. If you or a relative donates blood, this is often done in anticipation of surgery and is not appropriate for emergency situations. It takes many days to process the donated blood. RISKS AND COMPLICATIONS Although transfusion therapy is very safe and saves many lives, the main dangers of transfusion include:  Getting an infectious disease. Developing a transfusion reaction. This is an allergic reaction to something in the blood you were given. Every precaution is taken to prevent this. The decision to have a blood transfusion has been considered carefully by your caregiver before blood is given. Blood is not given unless the benefits outweigh the risks.  AFTER SURGERY INSTRUCTIONS  Return to work: 4-6 weeks if applicable  Activity: 1. Be up and  out of the bed during the day.  Take a nap if needed.  You may walk up steps but be careful and use the hand rail.  Stair climbing will tire you more than you think, you may need to stop part way and rest.   2. No lifting or straining for 6 weeks over 10 pounds. No pushing, pulling, straining for 6 weeks.  3. No driving for 1 week(s).  Do not drive if you are taking narcotic pain medicine and make sure that your reaction time has returned.   4. You can shower as soon as the next day after surgery. Shower daily.  Use your regular soap and water (not directly on the incision) and pat your incision(s) dry afterwards; don't rub.  No tub baths or submerging your body in water until cleared by your surgeon. If you have the soap that was given to you by pre-surgical testing that was used before surgery, you do not need to use it afterwards because this can irritate your incisions.   5. No sexual activity and nothing in the vagina for 8 weeks.  6. You may experience a small amount of clear drainage from your incisions, which is normal.  If the drainage persists, increases, or changes color please call the office.  7. Do not use creams, lotions, or ointments such as neosporin on your incisions after surgery until advised by your surgeon because they can cause removal of the dermabond glue on your incisions.    8. You may experience vaginal spotting after surgery or around the 6-8 week mark from surgery when the stitches at the top of the vagina begin to dissolve.  The spotting is normal but if you experience heavy bleeding, call our office.  9. Take Tylenol or ibuprofen first for pain and only use narcotic pain medication for severe pain not relieved by the Tylenol or Ibuprofen.  Monitor your Tylenol intake to a max of 4,000 mg in a 24 hour period. You can alternate these medications after surgery.  Diet: 1. Low sodium Heart Healthy Diet is recommended but you are cleared to resume your normal (before  surgery) diet after your procedure.  2. It is safe to use a laxative, such as Miralax or Colace, if you have difficulty moving your bowels. You have been prescribed Sennakot-S to take at bedtime every evening after surgery to keep bowel movements regular and to prevent constipation.    Wound Care: 1. Keep  clean and dry.  Shower daily.  Reasons to call the Doctor: Fever - Oral temperature greater than 100.4 degrees Fahrenheit Foul-smelling vaginal discharge Difficulty urinating Nausea and vomiting Increased pain at the site of the incision that is unrelieved with pain medicine. Difficulty breathing with or without chest pain New calf pain especially if only on one side Sudden, continuing increased vaginal bleeding with or without clots.   Contacts: For questions or concerns you should contact:  Dr. Jeral Pinch at [phone]  Joylene John, NP at [phone]  After Hours: call [phone] and have the GYN Oncologist paged/contacted (after 5 pm or on the weekends).  Messages sent via mychart are for non-urgent matters and are not responded to after hours so for urgent needs, please call the after hours number.

## 2021-03-28 NOTE — Progress Notes (Signed)
Sent message, via epic in basket, requesting orders in epic from surgeon.  

## 2021-03-30 ENCOUNTER — Ambulatory Visit (HOSPITAL_COMMUNITY)
Admission: RE | Admit: 2021-03-30 | Discharge: 2021-03-30 | Disposition: A | Discharge status: Home / Self Care | Payer: Medicare Other | Source: Ambulatory Visit | Attending: Gynecologic Oncology | Admitting: Gynecologic Oncology

## 2021-03-30 ENCOUNTER — Other Ambulatory Visit: Payer: Self-pay

## 2021-03-30 DIAGNOSIS — C541 Malignant neoplasm of endometrium: Secondary | ICD-10-CM | POA: Diagnosis present

## 2021-03-30 IMAGING — CT CT ABD-PELV W/ CM
2 of 5 series · 15 of 46 positions shown, 17 images · IV contrast (APPLIED)
Comparison: Pelvic ultrasound, [DATE], MR abdomen, [DATE]

CLINICAL DATA: New diagnosis endometrial cancer staging, additional
history of right breast cancer and lung cancer

EXAM:
CT ABDOMEN AND PELVIS WITH CONTRAST
TECHNIQUE: Multidetector CT imaging of the abdomen and pelvis was performed
using the standard protocol following bolus administration of
intravenous contrast.
CONTRAST:  80mL OMNIPAQUE IOHEXOL 350 MG/ML SOLN, additional oral
enteric contrast

[Series 2: axial st · axial · 0.98mm/px · z∈[+882,+1272]mm · 12 of 91 slices shown, 14 images]
[im 7/91  soft-tissue]
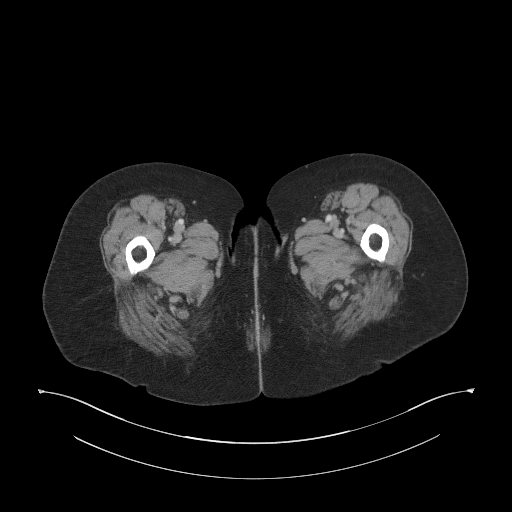
[im 7/91  bone]
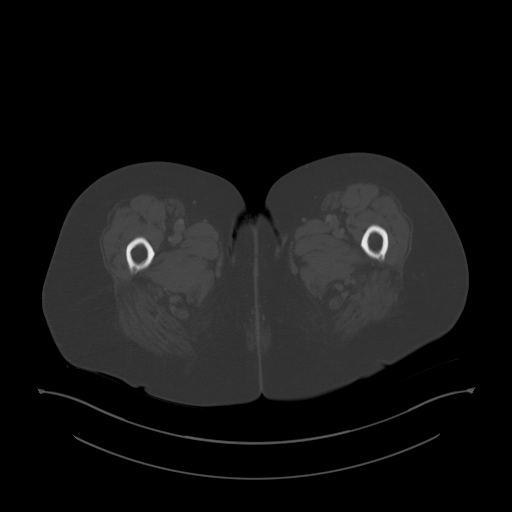
[im 13/91  soft-tissue]
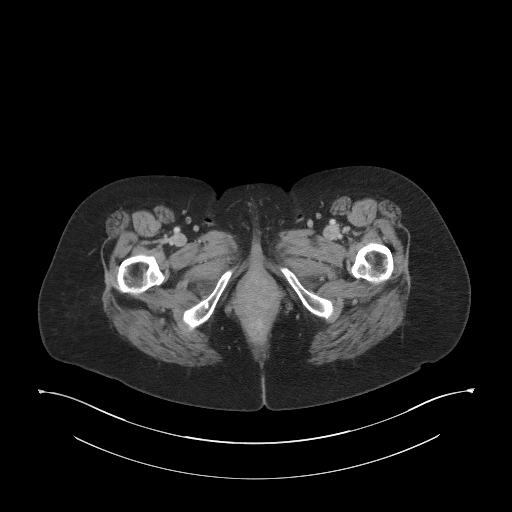
[im 19/91  soft-tissue]
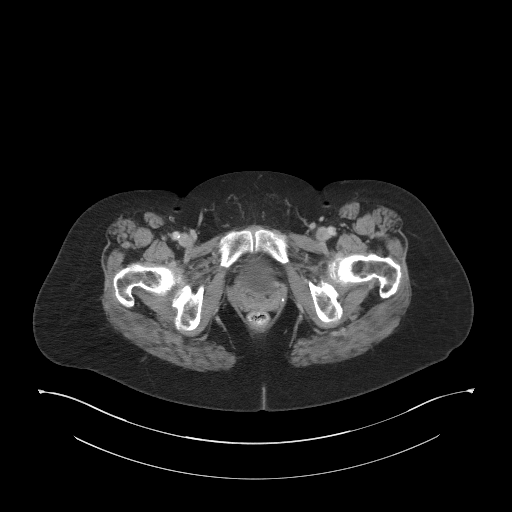
[im 31/91  soft-tissue]
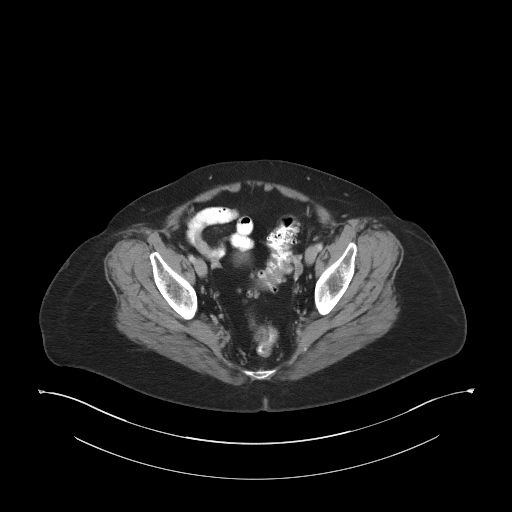
[im 37/91  soft-tissue]
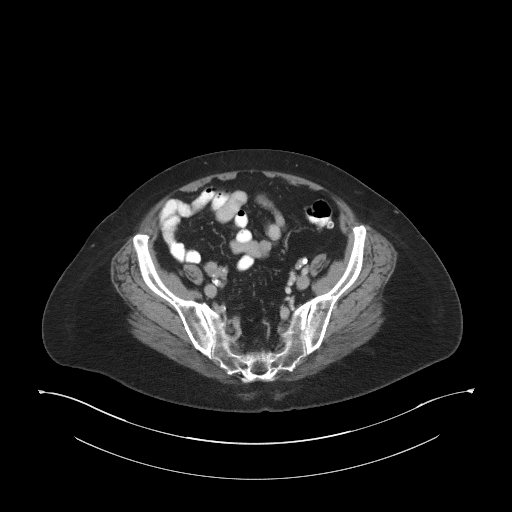
[im 43/91  soft-tissue]
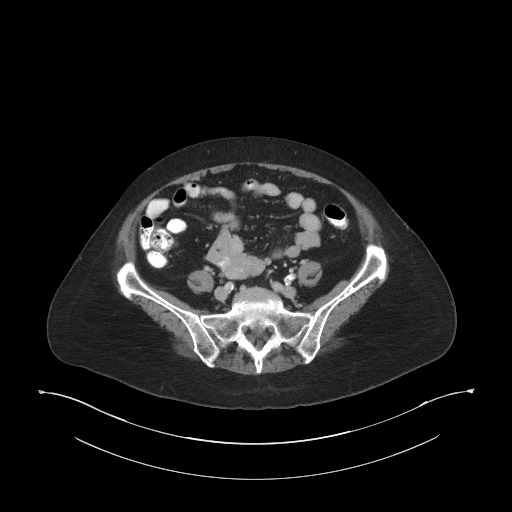
[im 49/91  soft-tissue]
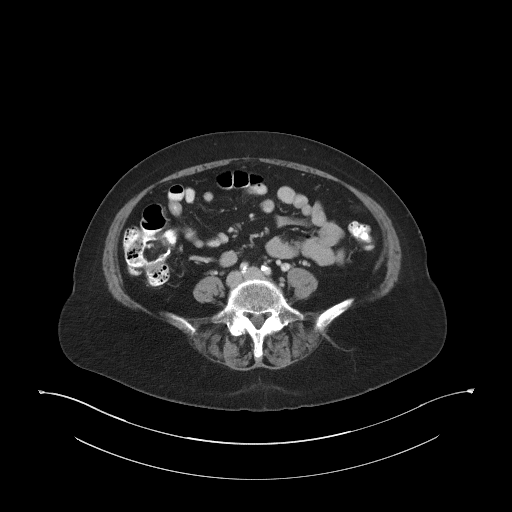
[im 55/91  soft-tissue]
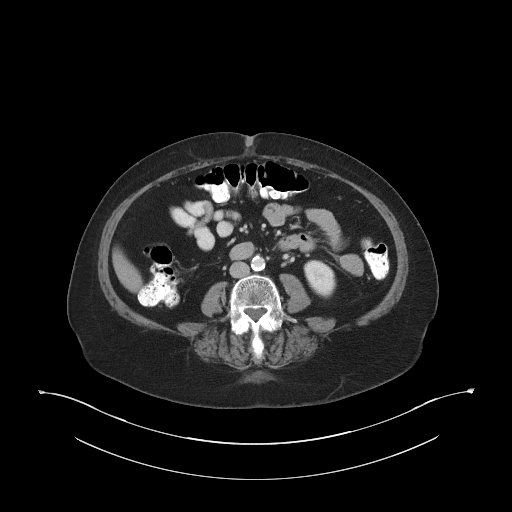
[im 61/91  soft-tissue]
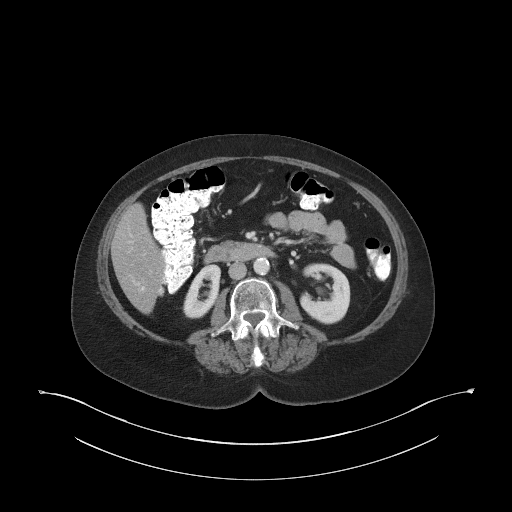
[im 61/91  bone]
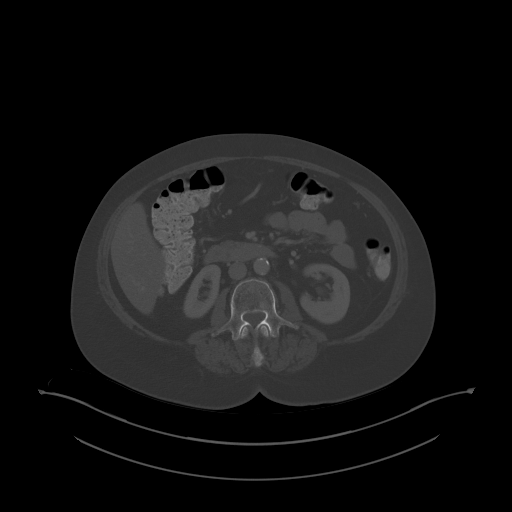
[im 73/91  soft-tissue]
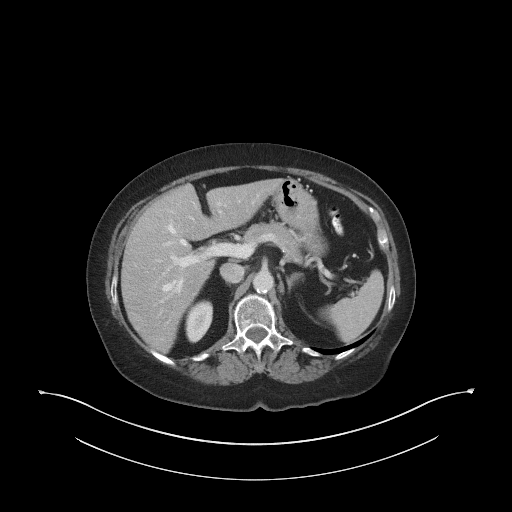
[im 79/91  soft-tissue]
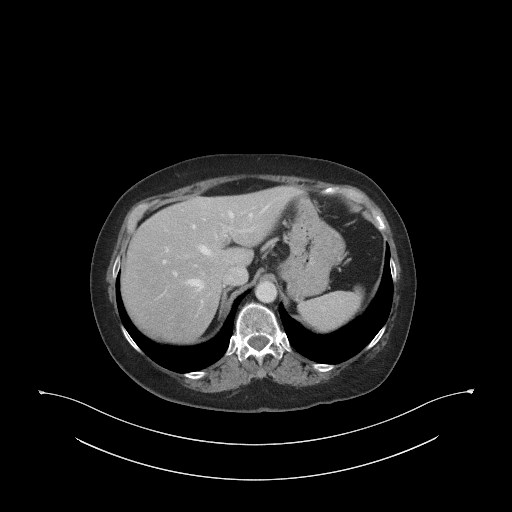
[im 85/91  soft-tissue]
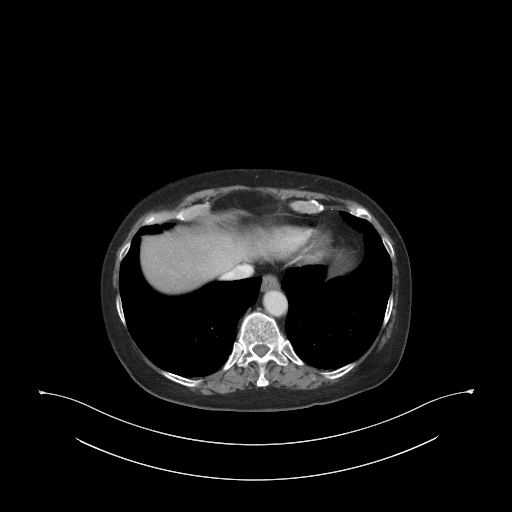

[Series 5: coronal st · coronal · 0.68mm/px · 3 of 98 slices shown]
[im 33/98  soft-tissue]
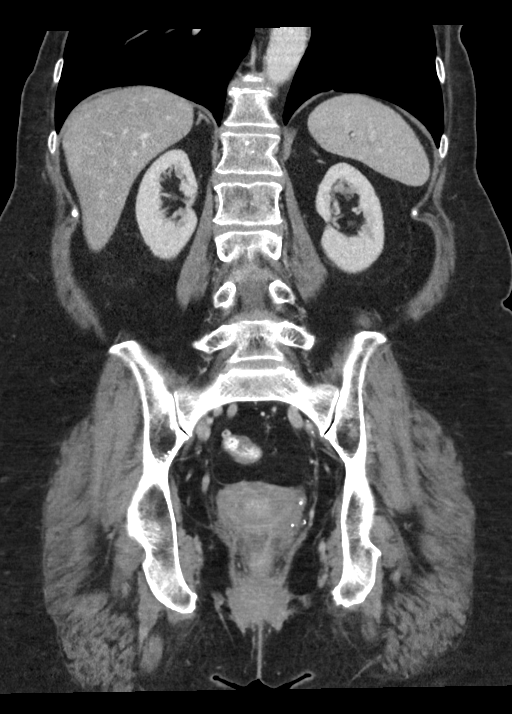
[im 44/98  soft-tissue]
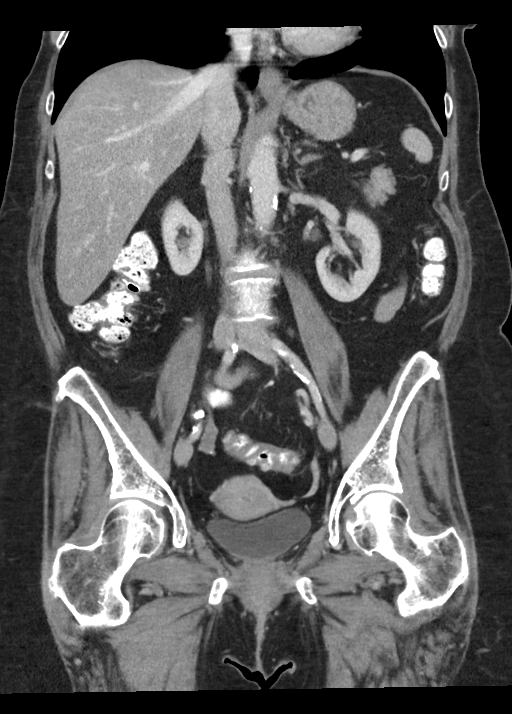
[im 54/98  soft-tissue]
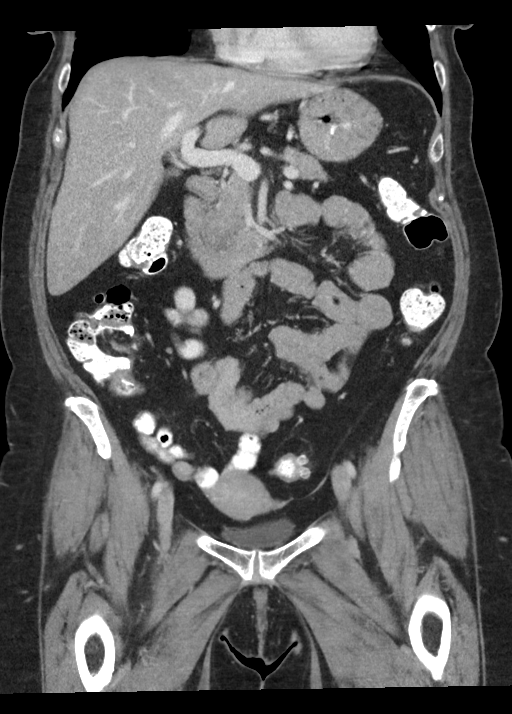

[15 of 46 positions shown; findings below may reference images not displayed]

FINDINGS: Lower chest: No acute abnormality. Status post right mastectomy with
implant reconstruction, partially imaged.

Hepatobiliary: Subcapsular hyperenhancing lesion of the lateral
inferior right lobe of the liver, hepatic segment VI is only very
faintly appreciated on this portal venous phase contrast examination
(series 2, image 19). No gallstones, gallbladder wall thickening, or
biliary dilatation.

Pancreas: Unremarkable. No pancreatic ductal dilatation or
surrounding inflammatory changes.

Spleen: Normal in size without significant abnormality.

Adrenals/Urinary Tract: Adrenal glands are unremarkable. Kidneys are
normal, without renal calculi, solid lesion, or hydronephrosis.
Bladder is unremarkable.

Stomach/Bowel: Stomach is within normal limits. Appendix is not
clearly visualized and may be surgically absent. No evidence of
bowel wall thickening, distention, or inflammatory changes.
Descending and sigmoid diverticulosis.

Vascular/Lymphatic: Aortic atherosclerosis. No enlarged abdominal or
pelvic lymph nodes.

Reproductive: Thickening of the endometrium, as seen on prior
ultrasound (series 6, image 59)

Other: No abdominal wall hernia or abnormality. No abdominopelvic
ascites.

Musculoskeletal: No acute or significant osseous findings.
IMPRESSION: 1. Abnormal thickening of the endometrium, as seen on prior
ultrasound and consistent with recently diagnosed endometrial
malignancy.
2. No evidence of lymphadenopathy or metastatic disease in the
abdomen or pelvis.
3. Subcapsular hyperenhancing lesion of the lateral inferior right
lobe of the liver, hepatic segment VI is only very faintly
appreciated on this portal venous phase contrast examination, better
characterized as a benign focal nodular hyperplasia by prior MR.
4. Diverticulosis.

Aortic Atherosclerosis ([28]-[28]).

## 2021-03-30 MED ORDER — SODIUM CHLORIDE (PF) 0.9 % IJ SOLN
INTRAMUSCULAR | Status: AC
  Filled 2021-03-30: qty 50

## 2021-03-30 MED ORDER — IOHEXOL 350 MG/ML SOLN
80.0000 mL | Freq: Once | INTRAVENOUS | Status: AC | PRN
  Administered 2021-03-30: 80 mL via INTRAVENOUS

## 2021-04-05 NOTE — Progress Notes (Addendum)
Covid test 04/07/2021 at 0900am    Come thru main entrance at [facility].  Have a seat in the lobby on the right as you come thru the door.,  Call [phone] and give them your name and let them know you are here for covid testing .               Your procedure is scheduled on:                04/11/2021.   Report to [facility] Main  Entrance   Report to admitting at  [place] AM     Call this number if you have problems the morning of surgery [phone]    REMEMBER: NO  SOLID FOOD CANDY OR GUM AFTER MIDNIGHT. CLEAR LIQUIDS UNTIL   0430am       . NOTHING BY MOUTH EXCEPT CLEAR LIQUIDS UNTIL  0430am   . PLEASE FINISH ENSURE DRINK PER SURGEON ORDER  WHICH NEEDS TO BE COMPLETED AT 0430am  Eat a light diet the day before surgery.  Avoid gas producing foods.      Marland Kitchen      CLEAR LIQUID DIET   Foods Allowed                                                                    Coffee and tea, regular and decaf                            Fruit ices (not with fruit pulp)                                      Iced Popsicles                                    Carbonated beverages, regular and diet                                    Cranberry, grape and apple juices Sports drinks like Gatorade Lightly seasoned clear broth or consume(fat free) Sugar,  ___________________________________________________________________      BRUSH YOUR TEETH MORNING OF SURGERY AND RINSE YOUR MOUTH OUT, NO CHEWING GUM CANDY OR MINTS.     Take these medicines the morning of surgery with A SIP OF WATER:  eye drops as usual mexium, flonase   DO NOT TAKE ANY DIABETIC MEDICATIONS DAY OF YOUR SURGERY                               You may not have any metal on your body including hair pins and              piercings  Do not wear jewelry, make-up, lotions, powders or perfumes, deodorant             Do not wear nail polish on your fingernails.  Do not shave  48 hours prior to surgery.  Men may  shave face and neck.   Do not bring valuables to the hospital. [place].  Contacts, dentures or bridgework may not be worn into surgery.  Leave suitcase in the car. After surgery it may be brought to your room.     Patients discharged the day of surgery will not be allowed to drive home. IF YOU ARE HAVING SURGERY AND GOING HOME THE SAME DAY, YOU MUST HAVE AN ADULT TO DRIVE YOU HOME AND BE WITH YOU FOR 24 HOURS. YOU MAY GO HOME BY TAXI OR UBER OR ORTHERWISE, BUT AN ADULT MUST ACCOMPANY YOU HOME AND STAY WITH YOU FOR 24 HOURS.  Name and phone number of your driver:  Special Instructions: N/A              Please read over the following fact sheets you were given: _____________________________________________________________________  [facility] - Preparing for Surgery Before surgery, you can play an important role.  Because skin is not sterile, your skin needs to be as free of germs as possible.  You can reduce the number of germs on your skin by washing with CHG (chlorahexidine gluconate) soap before surgery.  CHG is an antiseptic cleaner which kills germs and bonds with the skin to continue killing germs even after washing. Please DO NOT use if you have an allergy to CHG or antibacterial soaps.  If your skin becomes reddened/irritated stop using the CHG and inform your nurse when you arrive at Short Stay. Do not shave (including legs and underarms) for at least 48 hours prior to the first CHG shower.  You may shave your face/neck. Please follow these instructions carefully:  1.  Shower with CHG Soap the night before surgery and the  morning of Surgery.  2.  If you choose to wash your hair, wash your hair first as usual with your  normal  shampoo.  3.  After you shampoo, rinse your hair and body thoroughly to remove the  shampoo.                           4.  Use CHG as you would any other liquid soap.  You can apply chg directly  to the skin and  wash                       Gently with a scrungie or clean washcloth.  5.  Apply the CHG Soap to your body ONLY FROM THE NECK DOWN.   Do not use on face/ open                           Wound or open sores. Avoid contact with eyes, ears mouth and genitals (private parts).                       Wash face,  Genitals (private parts) with your normal soap.             6.  Wash thoroughly, paying special attention to the area where your surgery  will be performed.  7.  Thoroughly rinse your body with warm water from the neck down.  8.  DO NOT shower/wash with your normal soap after using and rinsing off  the CHG Soap.  9.  Pat yourself dry with a clean towel.            10.  Wear clean pajamas.            11.  Place clean sheets on your bed the night of your first shower and do not  sleep with pets. Day of Surgery : Do not apply any lotions/deodorants the morning of surgery.  Please wear clean clothes to the hospital/surgery center.  FAILURE TO FOLLOW THESE INSTRUCTIONS MAY RESULT IN THE CANCELLATION OF YOUR SURGERY PATIENT SIGNATURE_________________________________  NURSE SIGNATURE__________________________________  ________________________________________________________________________

## 2021-04-05 NOTE — Progress Notes (Addendum)
Anesthesia Review:  PCP: Dr [name]  Cardiologist : none  Chest x-ray : EKG : Echo : Stress test: Cardiac Cath :  Activity level: can do a flgiht of stairs without difficulty  Sleep Study/ CPAP : none  Fasting Blood Sugar :      / Checks Blood Sugar -- times a day:   Blood Thinner/ Instructions /Last Dose: ASA / Instructions/ Last Dose :  NO COVID TEST- AMBULATORY SURGERY

## 2021-04-06 ENCOUNTER — Encounter (HOSPITAL_COMMUNITY)
Admission: RE | Admit: 2021-04-06 | Discharge: 2021-04-06 | Disposition: A | Discharge status: Home / Self Care | Payer: Medicare Other | Source: Ambulatory Visit | Attending: Gynecologic Oncology | Admitting: Gynecologic Oncology

## 2021-04-06 ENCOUNTER — Other Ambulatory Visit: Payer: Self-pay

## 2021-04-06 ENCOUNTER — Encounter: Payer: Self-pay | Admitting: Gynecologic Oncology

## 2021-04-06 ENCOUNTER — Encounter (HOSPITAL_COMMUNITY): Payer: Self-pay

## 2021-04-06 VITALS — BP 143/85 | HR 82 | Temp 98.5°F | Resp 16 | Ht 63.5 in | Wt 154.0 lb

## 2021-04-06 DIAGNOSIS — C541 Malignant neoplasm of endometrium: Secondary | ICD-10-CM | POA: Diagnosis not present

## 2021-04-06 DIAGNOSIS — Z01812 Encounter for preprocedural laboratory examination: Secondary | ICD-10-CM | POA: Diagnosis present

## 2021-04-06 DIAGNOSIS — Z01818 Encounter for other preprocedural examination: Secondary | ICD-10-CM

## 2021-04-06 HISTORY — DX: Nontoxic goiter, unspecified: E04.9

## 2021-04-06 LAB — CBC
HCT: 41.1 % (ref 36.0–46.0)
Hemoglobin: 13 g/dL (ref 12.0–15.0)
MCH: 29.9 pg (ref 26.0–34.0)
MCHC: 31.6 g/dL (ref 30.0–36.0)
MCV: 94.5 fL (ref 80.0–100.0)
Platelets: 377 10*3/uL (ref 150–400)
RBC: 4.35 MIL/uL (ref 3.87–5.11)
RDW: 13.2 % (ref 11.5–15.5)
WBC: 6.6 10*3/uL (ref 4.0–10.5)
nRBC: 0 % (ref 0.0–0.2)

## 2021-04-06 LAB — COMPREHENSIVE METABOLIC PANEL
ALT: 16 U/L (ref 0–44)
AST: 19 U/L (ref 15–41)
Albumin: 3.6 g/dL (ref 3.5–5.0)
Alkaline Phosphatase: 61 U/L (ref 38–126)
Anion gap: 7 (ref 5–15)
BUN: 15 mg/dL (ref 8–23)
CO2: 25 mmol/L (ref 22–32)
Calcium: 9.2 mg/dL (ref 8.9–10.3)
Chloride: 105 mmol/L (ref 98–111)
Creatinine, Ser: 0.78 mg/dL (ref 0.44–1.00)
GFR, Estimated: >60 mL/min (ref >60)
Glucose, Bld: 103 mg/dL — ABNORMAL HIGH (ref 70–99)
Potassium: 4.8 mmol/L (ref 3.5–5.1)
Sodium: 137 mmol/L (ref 135–145)
Total Bilirubin: 0.3 mg/dL (ref 0.3–1.2)
Total Protein: 7.1 g/dL (ref 6.5–8.1)

## 2021-04-07 ENCOUNTER — Encounter (HOSPITAL_COMMUNITY): Payer: Medicare Other

## 2021-04-07 ENCOUNTER — Encounter (HOSPITAL_COMMUNITY): Payer: Self-pay

## 2021-04-07 LAB — CA 125: Cancer Antigen (CA) 125: 10.2 U/mL (ref 0.0–38.1)

## 2021-04-10 ENCOUNTER — Telehealth: Payer: Self-pay

## 2021-04-10 NOTE — Telephone Encounter (Signed)
Telephone call to check on pre-operative status.  Patient compliant with pre-operative instructions.  Reinforced nothing to eat after midnight. Clear liquids until 0430. Patient to arrive at 0515.  No questions or concerns voiced.  Instructed to call for any needs.  ?

## 2021-04-10 NOTE — Anesthesia Preprocedure Evaluation (Addendum)
Anesthesia Evaluation  Patient identified by MRN, date of birth, ID band Patient awake    Reviewed: Allergy & Precautions, NPO status , Patient's Chart, lab work & pertinent test results  History of Anesthesia Complications (+) PONVNegative for: history of anesthetic complications  Airway Mallampati: III  TM Distance: >3 FB Neck ROM: Limited    Dental  (+) Dental Advisory Given, Missing   Pulmonary neg pulmonary ROS, former smoker,    Pulmonary exam normal        Cardiovascular negative cardio ROS Normal cardiovascular exam     Neuro/Psych    GI/Hepatic Neg liver ROS, GERD  ,  Endo/Other  negative endocrine ROS  Renal/GU negative Renal ROS  negative genitourinary   Musculoskeletal  (+) Arthritis ,   Abdominal   Peds  Hematology negative hematology ROS (+)   Anesthesia Other Findings  H/o breast and lung cancers  Reproductive/Obstetrics Endometrial cancer                            Anesthesia Physical Anesthesia Plan  ASA: 2  Anesthesia Plan: General   Post-op Pain Management: Tylenol PO (pre-op), Ofirmev IV (intra-op) and Lidocaine infusion   Induction: Intravenous  PONV Risk Score and Plan: 4 or greater and Ondansetron, Dexamethasone, Treatment may vary due to age or medical condition, Midazolam and Scopolamine patch - Pre-op  Airway Management Planned: Oral ETT and Video Laryngoscope Planned  Additional Equipment: None  Intra-op Plan:   Post-operative Plan: Extubation in OR  Informed Consent: I have reviewed the patients History and Physical, chart, labs and discussed the procedure including the risks, benefits and alternatives for the proposed anesthesia with the patient or authorized representative who has indicated his/her understanding and acceptance.     Dental advisory given  Plan Discussed with:   Anesthesia Plan Comments:        Anesthesia Quick  Evaluation

## 2021-04-11 ENCOUNTER — Ambulatory Visit (HOSPITAL_COMMUNITY)
Admission: RE | Admit: 2021-04-11 | Discharge: 2021-04-11 | Disposition: A | Discharge status: Home / Self Care | Payer: Medicare Other | Attending: Gynecologic Oncology | Admitting: Gynecologic Oncology

## 2021-04-11 ENCOUNTER — Ambulatory Visit (HOSPITAL_COMMUNITY): Payer: Medicare Other | Admitting: Anesthesiology

## 2021-04-11 ENCOUNTER — Encounter (HOSPITAL_COMMUNITY): Payer: Self-pay | Admitting: Gynecologic Oncology

## 2021-04-11 ENCOUNTER — Encounter (HOSPITAL_COMMUNITY)
Admission: RE | Disposition: A | Discharge status: Home / Self Care | Payer: Self-pay | Source: Home / Self Care | Attending: Gynecologic Oncology

## 2021-04-11 DIAGNOSIS — C542 Malignant neoplasm of myometrium: Secondary | ICD-10-CM | POA: Insufficient documentation

## 2021-04-11 DIAGNOSIS — Z902 Acquired absence of lung [part of]: Secondary | ICD-10-CM | POA: Diagnosis not present

## 2021-04-11 DIAGNOSIS — Z87891 Personal history of nicotine dependence: Secondary | ICD-10-CM | POA: Insufficient documentation

## 2021-04-11 DIAGNOSIS — K219 Gastro-esophageal reflux disease without esophagitis: Secondary | ICD-10-CM | POA: Diagnosis not present

## 2021-04-11 DIAGNOSIS — C541 Malignant neoplasm of endometrium: Secondary | ICD-10-CM | POA: Insufficient documentation

## 2021-04-11 DIAGNOSIS — Z85118 Personal history of other malignant neoplasm of bronchus and lung: Secondary | ICD-10-CM | POA: Insufficient documentation

## 2021-04-11 DIAGNOSIS — Z853 Personal history of malignant neoplasm of breast: Secondary | ICD-10-CM | POA: Insufficient documentation

## 2021-04-11 DIAGNOSIS — D270 Benign neoplasm of right ovary: Secondary | ICD-10-CM | POA: Diagnosis not present

## 2021-04-11 DIAGNOSIS — R0600 Dyspnea, unspecified: Secondary | ICD-10-CM | POA: Diagnosis not present

## 2021-04-11 DIAGNOSIS — R34 Anuria and oliguria: Secondary | ICD-10-CM

## 2021-04-11 HISTORY — PX: ROBOTIC ASSISTED BILATERAL SALPINGO OOPHERECTOMY: SHX6078

## 2021-04-11 HISTORY — PX: CYSTOSCOPY: SHX5120

## 2021-04-11 HISTORY — PX: SENTINEL NODE BIOPSY: SHX6608

## 2021-04-11 LAB — TYPE AND SCREEN
ABO/RH(D): A POS
Antibody Screen: NEGATIVE

## 2021-04-11 SURGERY — SALPINGO-OOPHORECTOMY, BILATERAL, ROBOT-ASSISTED
Anesthesia: General

## 2021-04-11 MED ORDER — LACTATED RINGERS IV SOLN: INTRAVENOUS | Status: DC

## 2021-04-11 MED ORDER — OXYCODONE HCL 5 MG/5ML PO SOLN: 5.0000 mg | Freq: Once | ORAL | Status: AC | PRN

## 2021-04-11 MED ORDER — PROPOFOL 10 MG/ML IV BOLUS
INTRAVENOUS | Status: AC
  Filled 2021-04-11: qty 20

## 2021-04-11 MED ORDER — PROPOFOL 10 MG/ML IV BOLUS
INTRAVENOUS | Status: DC | PRN
  Administered 2021-04-11: 200 mg via INTRAVENOUS

## 2021-04-11 MED ORDER — CLINDAMYCIN PHOSPHATE 900 MG/50ML IV SOLN
INTRAVENOUS | Status: AC
  Filled 2021-04-11: qty 50

## 2021-04-11 MED ORDER — FENTANYL CITRATE PF 50 MCG/ML IJ SOSY
PREFILLED_SYRINGE | INTRAMUSCULAR | Status: AC
  Administered 2021-04-11: 25 ug via INTRAVENOUS
  Filled 2021-04-11: qty 2

## 2021-04-11 MED ORDER — ENSURE PRE-SURGERY PO LIQD
296.0000 mL | Freq: Once | ORAL | Status: DC
  Filled 2021-04-11: qty 296

## 2021-04-11 MED ORDER — OXYCODONE HCL 5 MG PO TABS: 5.0000 mg | ORAL_TABLET | Freq: Once | ORAL | Status: AC | PRN

## 2021-04-11 MED ORDER — LIDOCAINE 2% (20 MG/ML) 5 ML SYRINGE
INTRAMUSCULAR | Status: DC | PRN
  Administered 2021-04-11: 100 mg via INTRAVENOUS

## 2021-04-11 MED ORDER — HEPARIN SODIUM (PORCINE) 5000 UNIT/ML IJ SOLN
INTRAMUSCULAR | Status: AC
  Administered 2021-04-11: 5000 [IU] via SUBCUTANEOUS
  Filled 2021-04-11: qty 1

## 2021-04-11 MED ORDER — ONDANSETRON HCL 4 MG/2ML IJ SOLN
INTRAMUSCULAR | Status: DC | PRN
  Administered 2021-04-11: 4 mg via INTRAVENOUS

## 2021-04-11 MED ORDER — SCOPOLAMINE 1 MG/3DAYS TD PT72
MEDICATED_PATCH | TRANSDERMAL | Status: AC
  Administered 2021-04-11: 1.5 mg via TRANSDERMAL
  Filled 2021-04-11: qty 1

## 2021-04-11 MED ORDER — FENTANYL CITRATE (PF) 100 MCG/2ML IJ SOLN
INTRAMUSCULAR | Status: DC | PRN
  Administered 2021-04-11 (×2): 50 ug via INTRAVENOUS
  Administered 2021-04-11: 100 ug via INTRAVENOUS

## 2021-04-11 MED ORDER — LIDOCAINE HCL (PF) 2 % IJ SOLN
INTRAMUSCULAR | Status: AC
  Filled 2021-04-11: qty 5

## 2021-04-11 MED ORDER — CIPROFLOXACIN IN D5W 400 MG/200ML IV SOLN
400.0000 mg | INTRAVENOUS | Status: AC
  Administered 2021-04-11: 400 mg via INTRAVENOUS

## 2021-04-11 MED ORDER — AMISULPRIDE (ANTIEMETIC) 5 MG/2ML IV SOLN: 10.0000 mg | Freq: Once | INTRAVENOUS | Status: DC | PRN

## 2021-04-11 MED ORDER — ONDANSETRON HCL 4 MG/2ML IJ SOLN
INTRAMUSCULAR | Status: AC
  Filled 2021-04-11: qty 2

## 2021-04-11 MED ORDER — STERILE WATER FOR INJECTION IJ SOLN
INTRAMUSCULAR | Status: DC | PRN
  Administered 2021-04-11: 10 mL

## 2021-04-11 MED ORDER — MIDAZOLAM HCL 2 MG/2ML IJ SOLN
INTRAMUSCULAR | Status: AC
  Filled 2021-04-11: qty 2

## 2021-04-11 MED ORDER — SUGAMMADEX SODIUM 200 MG/2ML IV SOLN
INTRAVENOUS | Status: DC | PRN
  Administered 2021-04-11: 200 mg via INTRAVENOUS

## 2021-04-11 MED ORDER — STERILE WATER FOR IRRIGATION IR SOLN
Status: DC | PRN
  Administered 2021-04-11: 3000 mL

## 2021-04-11 MED ORDER — DEXAMETHASONE SODIUM PHOSPHATE 10 MG/ML IJ SOLN
INTRAMUSCULAR | Status: DC | PRN
  Administered 2021-04-11: 8 mg via INTRAVENOUS

## 2021-04-11 MED ORDER — DEXAMETHASONE SODIUM PHOSPHATE 4 MG/ML IJ SOLN: 4.0000 mg | INTRAMUSCULAR | Status: DC

## 2021-04-11 MED ORDER — FENTANYL CITRATE (PF) 100 MCG/2ML IJ SOLN
INTRAMUSCULAR | Status: AC
  Filled 2021-04-11: qty 2

## 2021-04-11 MED ORDER — ACETAMINOPHEN 500 MG PO TABS: 1000.0000 mg | ORAL_TABLET | Freq: Once | ORAL | Status: DC

## 2021-04-11 MED ORDER — BUPIVACAINE HCL 0.25 % IJ SOLN
INTRAMUSCULAR | Status: AC
  Filled 2021-04-11: qty 1

## 2021-04-11 MED ORDER — ROCURONIUM BROMIDE 10 MG/ML (PF) SYRINGE
PREFILLED_SYRINGE | INTRAVENOUS | Status: DC | PRN
Start: 2021-04-11 — End: 2021-04-11
  Administered 2021-04-11: 70 mg via INTRAVENOUS
  Administered 2021-04-11: 30 mg via INTRAVENOUS

## 2021-04-11 MED ORDER — CIPROFLOXACIN IN D5W 400 MG/200ML IV SOLN
INTRAVENOUS | Status: AC
  Filled 2021-04-11: qty 200

## 2021-04-11 MED ORDER — CLINDAMYCIN PHOSPHATE 900 MG/50ML IV SOLN
900.0000 mg | INTRAVENOUS | Status: AC
  Administered 2021-04-11: 900 mg via INTRAVENOUS

## 2021-04-11 MED ORDER — DEXAMETHASONE SODIUM PHOSPHATE 10 MG/ML IJ SOLN
INTRAMUSCULAR | Status: AC
  Filled 2021-04-11: qty 1

## 2021-04-11 MED ORDER — STERILE WATER FOR INJECTION IJ SOLN
INTRAMUSCULAR | Status: AC
  Filled 2021-04-11: qty 10

## 2021-04-11 MED ORDER — HEPARIN SODIUM (PORCINE) 5000 UNIT/ML IJ SOLN: 5000.0000 [IU] | INTRAMUSCULAR | Status: AC

## 2021-04-11 MED ORDER — STERILE WATER FOR IRRIGATION IR SOLN
Status: DC | PRN
  Administered 2021-04-11: 1000 mL

## 2021-04-11 MED ORDER — SCOPOLAMINE 1 MG/3DAYS TD PT72: 1.0000 | MEDICATED_PATCH | TRANSDERMAL | Status: DC

## 2021-04-11 MED ORDER — FENTANYL CITRATE PF 50 MCG/ML IJ SOSY
25.0000 ug | PREFILLED_SYRINGE | INTRAMUSCULAR | Status: DC | PRN
  Administered 2021-04-11: 25 ug via INTRAVENOUS

## 2021-04-11 MED ORDER — ACETAMINOPHEN 500 MG PO TABS: 1000.0000 mg | ORAL_TABLET | ORAL | Status: AC

## 2021-04-11 MED ORDER — OXYCODONE HCL 5 MG PO TABS
ORAL_TABLET | ORAL | Status: AC
  Administered 2021-04-11: 5 mg via ORAL
  Filled 2021-04-11: qty 1

## 2021-04-11 MED ORDER — ROCURONIUM BROMIDE 10 MG/ML (PF) SYRINGE
PREFILLED_SYRINGE | INTRAVENOUS | Status: AC
  Filled 2021-04-11: qty 10

## 2021-04-11 MED ORDER — LACTATED RINGERS IR SOLN
Status: DC | PRN
  Administered 2021-04-11: 1000 mL

## 2021-04-11 MED ORDER — ONDANSETRON HCL 4 MG/2ML IJ SOLN: 4.0000 mg | Freq: Once | INTRAMUSCULAR | Status: DC | PRN

## 2021-04-11 MED ORDER — BUPIVACAINE HCL 0.25 % IJ SOLN
INTRAMUSCULAR | Status: DC | PRN
  Administered 2021-04-11: 30 mL

## 2021-04-11 MED ORDER — ACETAMINOPHEN 500 MG PO TABS
ORAL_TABLET | ORAL | Status: AC
  Administered 2021-04-11: 1000 mg via ORAL
  Filled 2021-04-11: qty 2

## 2021-04-11 MED ORDER — MIDAZOLAM HCL 2 MG/2ML IJ SOLN
INTRAMUSCULAR | Status: DC | PRN
  Administered 2021-04-11: 2 mg via INTRAVENOUS

## 2021-04-11 SURGICAL SUPPLY — 79 items
ADH SKN CLS APL DERMABOND .7 (GAUZE/BANDAGES/DRESSINGS) ×2
AGENT HMST KT MTR STRL THRMB (HEMOSTASIS)
APL ESCP 34 STRL LF DISP (HEMOSTASIS)
APPLICATOR SURGIFLO ENDO (HEMOSTASIS) IMPLANT
BACTOSHIELD CHG 4% 4OZ (MISCELLANEOUS) ×1
BAG COUNTER SPONGE SURGICOUNT (BAG) IMPLANT
BAG LAPAROSCOPIC 12 15 PORT 16 (BASKET) IMPLANT
BAG RETRIEVAL 12/15 (BASKET)
BAG SPEC RTRVL LRG 6X4 10 (ENDOMECHANICALS)
BAG SPNG CNTER NS LX DISP (BAG)
BLADE SURG SZ10 CARB STEEL (BLADE) IMPLANT
COVER BACK TABLE 60X90IN (DRAPES) ×4 IMPLANT
COVER TIP SHEARS 8 DVNC (MISCELLANEOUS) ×3 IMPLANT
COVER TIP SHEARS 8MM DA VINCI (MISCELLANEOUS) ×3
DECANTER SPIKE VIAL GLASS SM (MISCELLANEOUS) ×5 IMPLANT
DERMABOND ADVANCED (GAUZE/BANDAGES/DRESSINGS) ×1
DERMABOND ADVANCED .7 DNX12 (GAUZE/BANDAGES/DRESSINGS) ×3 IMPLANT
DRAPE ARM DVNC X/XI (DISPOSABLE) ×12 IMPLANT
DRAPE COLUMN DVNC XI (DISPOSABLE) ×3 IMPLANT
DRAPE DA VINCI XI ARM (DISPOSABLE) ×12
DRAPE DA VINCI XI COLUMN (DISPOSABLE) ×3
DRAPE SHEET LG 3/4 BI-LAMINATE (DRAPES) ×4 IMPLANT
DRAPE SURG IRRIG POUCH 19X23 (DRAPES) ×4 IMPLANT
DRSG OPSITE POSTOP 4X6 (GAUZE/BANDAGES/DRESSINGS) IMPLANT
DRSG OPSITE POSTOP 4X8 (GAUZE/BANDAGES/DRESSINGS) IMPLANT
ELECT PENCIL ROCKER SW 15FT (MISCELLANEOUS) ×1 IMPLANT
ELECT REM PT RETURN 15FT ADLT (MISCELLANEOUS) ×4 IMPLANT
GAUZE 4X4 16PLY ~~LOC~~+RFID DBL (SPONGE) ×4 IMPLANT
GLOVE SURG ENC MOIS LTX SZ6 (GLOVE) ×16 IMPLANT
GLOVE SURG ENC MOIS LTX SZ6.5 (GLOVE) ×8 IMPLANT
GOWN STRL REUS W/ TWL LRG LVL3 (GOWN DISPOSABLE) ×12 IMPLANT
GOWN STRL REUS W/TWL LRG LVL3 (GOWN DISPOSABLE) ×13 IMPLANT
GOWN STRL REUS W/TWL XL LVL3 (GOWN DISPOSABLE) ×2 IMPLANT
HOLDER FOLEY CATH W/STRAP (MISCELLANEOUS) IMPLANT
IRRIG SUCT STRYKERFLOW 2 WTIP (MISCELLANEOUS) ×3
IRRIGATION SUCT STRKRFLW 2 WTP (MISCELLANEOUS) ×3 IMPLANT
KIT PROCEDURE DA VINCI SI (MISCELLANEOUS) ×3
KIT PROCEDURE DVNC SI (MISCELLANEOUS) IMPLANT
KIT TURNOVER KIT A (KITS) IMPLANT
MANIPULATOR ADVINCU DEL 3.0 PL (MISCELLANEOUS) IMPLANT
MANIPULATOR ADVINCU DEL 3.5 PL (MISCELLANEOUS) ×1 IMPLANT
MANIPULATOR UTERINE 4.5 ZUMI (MISCELLANEOUS) IMPLANT
NDL HYPO 21X1.5 SAFETY (NEEDLE) ×3 IMPLANT
NDL SPNL 18GX3.5 QUINCKE PK (NEEDLE) IMPLANT
NEEDLE HYPO 21X1.5 SAFETY (NEEDLE) ×3 IMPLANT
NEEDLE SPNL 18GX3.5 QUINCKE PK (NEEDLE) ×3 IMPLANT
OBTURATOR OPTICAL STANDARD 8MM (TROCAR) ×3
OBTURATOR OPTICAL STND 8 DVNC (TROCAR) ×2
OBTURATOR OPTICALSTD 8 DVNC (TROCAR) ×3 IMPLANT
PACK ROBOT GYN CUSTOM WL (TRAY / TRAY PROCEDURE) ×4 IMPLANT
PAD POSITIONING PINK XL (MISCELLANEOUS) ×4 IMPLANT
PORT ACCESS TROCAR AIRSEAL 12 (TROCAR) ×3 IMPLANT
PORT ACCESS TROCAR AIRSEAL 5M (TROCAR) ×1
POUCH SPECIMEN RETRIEVAL 10MM (ENDOMECHANICALS) IMPLANT
SCRUB CHG 4% DYNA-HEX 4OZ (MISCELLANEOUS) ×3 IMPLANT
SEAL CANN UNIV 5-8 DVNC XI (MISCELLANEOUS) ×12 IMPLANT
SEAL XI 5MM-8MM UNIVERSAL (MISCELLANEOUS) ×12
SEALER VESSEL DA VINCI XI (MISCELLANEOUS) ×3
SEALER VESSEL EXT DVNC XI (MISCELLANEOUS) IMPLANT
SET IRRIG Y TYPE TUR BLADDER L (SET/KITS/TRAYS/PACK) ×1 IMPLANT
SET TRI-LUMEN FLTR TB AIRSEAL (TUBING) ×4 IMPLANT
SPONGE T-LAP 18X18 ~~LOC~~+RFID (SPONGE) IMPLANT
SURGIFLO W/THROMBIN 8M KIT (HEMOSTASIS) IMPLANT
SUT MNCRL AB 4-0 PS2 18 (SUTURE) IMPLANT
SUT PDS AB 1 TP1 96 (SUTURE) IMPLANT
SUT VIC AB 0 CT1 27 (SUTURE)
SUT VIC AB 0 CT1 27XBRD ANTBC (SUTURE) IMPLANT
SUT VIC AB 2-0 CT1 27 (SUTURE)
SUT VIC AB 2-0 CT1 TAPERPNT 27 (SUTURE) IMPLANT
SUT VIC AB 4-0 PS2 18 (SUTURE) ×8 IMPLANT
SUT VIC AB 4-0 PS2 27 (SUTURE) ×1 IMPLANT
SYR 10ML LL (SYRINGE) ×1 IMPLANT
TOWEL OR NON WOVEN STRL DISP B (DISPOSABLE) IMPLANT
TRAP SPECIMEN MUCUS 40CC (MISCELLANEOUS) ×1 IMPLANT
TRAY FOLEY MTR SLVR 16FR STAT (SET/KITS/TRAYS/PACK) ×4 IMPLANT
TROCAR XCEL NON-BLD 5MMX100MML (ENDOMECHANICALS) ×1 IMPLANT
UNDERPAD 30X36 HEAVY ABSORB (UNDERPADS AND DIAPERS) ×8 IMPLANT
WATER STERILE IRR 1000ML POUR (IV SOLUTION) ×4 IMPLANT
YANKAUER SUCT BULB TIP 10FT TU (MISCELLANEOUS) IMPLANT

## 2021-04-11 NOTE — Brief Op Note (Signed)
04/11/2021  10:35 AM  PATIENT:  [name]  71 y.o. female  PRE-OPERATIVE DIAGNOSIS:  ENDOMETRIAL CANCER  POST-OPERATIVE DIAGNOSIS:  ENDOMETRIAL CANCER  PROCEDURE:  Procedure(s): XI ROBOTIC ASSISTED HYSTERECTOMY WITH BILATERAL SALPINGO OOPHORECTOMY; OMENTAL BIOPSY (Bilateral) SENTINEL NODE BIOPSY (N/A) CYSTOSCOPY (N/A)  SURGEON:  Surgeon(s) and Role:    * [name], MD - Primary    Lahoma Crocker, MD - Assisting  ANESTHESIA:   general  EBL:  125 mL   BLOOD ADMINISTERED:none  DRAINS: none   LOCAL MEDICATIONS USED:  MARCAINE     SPECIMEN:  uterus, cervix, tubes and ovaries, right obturator SLN, left obturator SLN, left external iliac SLN, peritoneal adhesions, omental biopsy  DISPOSITION OF SPECIMEN:  PATHOLOGY  COUNTS:  YES  TOURNIQUET:  * No tourniquets in log *  DICTATION: .Note written in EPIC  PLAN OF CARE: Discharge to home after PACU  PATIENT DISPOSITION:  PACU - hemodynamically stable.   Delay start of Pharmacological VTE agent (>24hrs) due to surgical blood loss or risk of bleeding: not applicable

## 2021-04-11 NOTE — Transfer of Care (Signed)
Immediate Anesthesia Transfer of Care Note  Patient: [name]  Procedure(s) Performed: XI ROBOTIC ASSISTED HYSTERECTOMY WITH BILATERAL SALPINGO OOPHORECTOMY; OMENTAL BIOPSY (Bilateral) SENTINEL NODE BIOPSY CYSTOSCOPY  Patient Location: PACU  Anesthesia Type:General  Level of Consciousness: awake, alert  and oriented  Airway & Oxygen Therapy: Patient Spontanous Breathing and Patient connected to face mask oxygen  Post-op Assessment: Report given to RN, Post -op Vital signs reviewed and stable and Patient moving all extremities X 4  Post vital signs: Reviewed and stable  Last Vitals:  Vitals Value Taken Time  BP 159/99   Temp    Pulse 95 04/11/21 1035  Resp 14 04/11/21 1035  SpO2 98 % 04/11/21 1035  Vitals shown include unvalidated device data.  Last Pain:  Vitals:   04/11/21 0606  TempSrc: Oral         Complications: No notable events documented.

## 2021-04-11 NOTE — Op Note (Signed)
OPERATIVE NOTE  Pre-operative Diagnosis: endometrial cancer, high grade  Post-operative Diagnosis: same, pelvic adhesive disease  Operation: Robotic-assisted laparoscopic total hysterectomy with bilateral salpingoophorectomy, SLN biopsy, omental biopsy, cystoscopy   Surgeon: Jeral Pinch MD  Assistant Surgeon: Lahoma Crocker MD (an MD assistant was necessary for tissue manipulation, management of robotic instrumentation, retraction and positioning due to the complexity of the case and hospital policies).   Anesthesia: GET  Urine Output: 35cc concentrated urine  Operative Findings: On EUA, small mobile uterus. On intra-abdominal entry, normal upper abdominal survey. Normal omentum, small bowel. Large bowels with some filmy adhesions to the left pelvic sidewall and the left adhesions. Filmy adhesions also noted from the sigmoid to the posterior uterus (some of the adhesions biopsied). Uterus 8cm and normal in appearance. Atrophic appearing adnexa. Mapping successful to bilateral SLNs. No obvious adenopathy. Minimal adhesions of the bladder to the LUS. No intra-abdominal or pelvic evidence of disease. Given concentrated appearing urine and low UOP, cystoscopy was performed. Bladder dome intact (bubbles seen). Good efflux noted from bilateral ureteral orifices.   Estimated Blood Loss:  125cc      Total IV Fluids: see I&O flowsheet         Specimens: uterus, cervix, bilateral tubes and ovaries, right obturator SLN, left obturator SLN, left external iliac SLN, pelvic/peritoneal adhesions, omental biopsy         Complications:  None apparent; patient tolerated the procedure well.         Disposition: PACU - hemodynamically stable.  Procedure Details  The patient was seen in the Holding Room. The risks, benefits, complications, treatment options, and expected outcomes were discussed with the patient.  The patient concurred with the proposed plan, giving informed consent.  The site of  surgery properly noted/marked. The patient was identified as [name] and the procedure verified as a Robotic-assisted hysterectomy with bilateral salpingo oophorectomy with SLN biopsy.   After induction of anesthesia, the patient was draped and prepped in the usual sterile manner. Patient was placed in supine position after anesthesia and draped and prepped in the usual sterile manner as follows: Her arms were tucked to her side with all appropriate precautions.  The shoulders were stabilized with padding and tape across the patient's chest.  The patient was placed in the semi-lithotomy position in [place].  The perineum and vagina were prepped with CholoraPrep. The patient was draped after the CholoraPrep had been allowed to dry for 3 minutes.  A Time Out was held and the above information confirmed.  The urethra was prepped with Betadine. Foley catheter was placed.  A sterile speculum was placed in the vagina.  The cervix was grasped with a single-tooth tenaculum. 2mg  total of ICG was injected into the cervical stroma at 2 and 9 o'clock with 1cc injected at a 1cm and 75mm depth (concentration 0.5mg /ml) in all locations. The cervix was dilated with Kennon Rounds dilators.  The 3.5 Delineator manipulator with a colpotomizer ring was placed without difficulty.  A pneum occluder balloon was placed over the manipulator.  OG tube placement was confirmed and to suction.   Next, a 10 mm skin incision was made 1 cm below the subcostal margin in the midclavicular line.  The 5 mm Optiview port and scope was used for direct entry.  Opening pressure was under 10 mm CO2.  The abdomen was insufflated and the findings were noted as above.   At this point and all points during the procedure, the patient's intra-abdominal pressure did not  exceed 15 mmHg. Next, an 8 mm skin incision was made superior to the umbilicus and a right and left port were placed about 8 cm lateral to the robot port on the right and left side.  A  fourth arm was placed on the right.  The 5 mm assist trocar was exchanged for a 10-12 mm port. All ports were placed under direct visualization.  The patient was placed in steep Trendelenburg.  Bowel was folded away into the upper abdomen.  The robot was docked in the normal manner.  The right and left peritoneum were opened parallel to the IP ligament to open the retroperitoneal spaces bilaterally. The round ligaments were transected. The SLN mapping was performed in bilateral pelvic basins. After identifying the ureters, the para rectal and paravesical spaces were opened up entirely with careful dissection below the level of the ureters bilaterally and to the depth of the uterine artery origin in order to skeletonize the uterine "web" and ensure visualization of all parametrial channels. The para-aortic basins were carefully exposed and evaluated for isolated para-aortic SLN's. Lymphatic channels were identified travelling to the following visualized sentinel lymph node's: right obturator iliac (and several surrounding Lns), left obturator and left external iliac. These SLN's were separated from their surrounding lymphatic tissue, removed and sent for permanent pathology.  The filmy adhesions between the sigmoid epiploica and colon and the left sidewall and posterior uterus were sharply lysed.   The hysterectomy was started.  The ureter was again noted to be on the medial leaf of the broad ligament.  The peritoneum above the ureter was incised and stretched and the infundibulopelvic ligament was skeletonized, cauterized and cut.  The posterior peritoneum was taken down to the level of the KOH ring.  The anterior peritoneum was also taken down.  The bladder flap was created to the level of the KOH ring.  The uterine artery on the right side was skeletonized, cauterized and cut in the normal manner.  A similar procedure was performed on the left.  The colpotomy was made and the uterus, cervix, bilateral  ovaries and tubes were amputated and delivered through the vagina.  Pedicles were inspected and excellent hemostasis was achieved.    One instrument was removed and the patient was taken out of Trendelenburg with table motion under direct visualization. The vessel sealer was then used to take a large infracolic omental biopsy. Once freed, this was handed out through the vagina. Hemostasis was assured. The patient was again placed in [place].   The colpotomy at the vaginal cuff was closed with Vicryl on a CT1 needle in running manner.  Irrigation was used and excellent hemostasis was achieved.  At this point in the procedure was completed.  Robotic instruments were removed under direct visulaization.  The robot was undocked.   Given low urine output and concentrated urine, foley catheter was removed and cystoscopy was performed with findings noted above. Surgeon's gloves were then changed.   The fascia at the 10-12 mm port was closed with 0 Vicryl on a UR-5 needle.  The subcuticular tissue was closed with 4-0 Vicryl and the skin was closed with 4-0 Monocryl in a subcuticular manner.  Dermabond was applied.    The vagina was swabbed with  minimal bleeding noted. All sponge, lap and needle counts were correct x  3.   The patient was transferred to the recovery room in stable condition.  Jeral Pinch, MD

## 2021-04-11 NOTE — Discharge Instructions (Addendum)
04/11/2021  Return to work: 4-6 weeks if applicable  Activity: 1. Be up and out of the bed during the day.  Take a nap if needed.  You may walk up steps but be careful and use the hand rail.  Stair climbing will tire you more than you think, you may need to stop part way and rest.   2. No lifting or straining over 10 lbs, pushing, pulling, straining for 6 weeks.  3. Do not drive if you are taking narcotic pain medicine. You need to make sure your reaction time has returned and you can brake safely. Usually this can be no driving for around 5-7 days.  4. Shower daily.  Use your regular soap to bathe and when finished pat your incisions dry; don't rub.  No tub baths until cleared by your surgeon.   5. No sexual activity and nothing in the vagina for 8 weeks.  6. You may experience a small amount of clear drainage from your incisions, which is normal.  If the drainage persists or increases, please call the office.  7. You may experience vaginal spotting after surgery or around the 6-8 week mark from surgery when the stitches at the top of the vagina begin to dissolve.  The spotting is normal but if you experience heavy bleeding, call our office.  8. Take Tylenol or ibuprofen first for pain and only use narcotic pain medication (oxycodone) for severe pain not relieved by the Tylenol or Ibuprofen.  Monitor your Tylenol intake to a max of 4,000 mg.   Diet: 1. Low sodium Heart Healthy Diet is recommended.  2. It is safe to use a laxative, such as Miralax or Colace, if you have difficulty moving your bowels. You can take Sennakot at bedtime every evening to keep bowel movements regular and to prevent constipation.    Wound Care: 1. Keep clean and dry.  Shower daily.  Reasons to call the Doctor: Fever - Oral temperature greater than 100.4 degrees Fahrenheit Foul-smelling vaginal discharge Difficulty urinating Nausea and vomiting Increased pain at the site of the incision that is unrelieved  with pain medicine. Difficulty breathing with or without chest pain New calf pain especially if only on one side Sudden, continuing increased vaginal bleeding with or without clots.   Contacts: For questions or concerns you should contact:  Dr. Jeral Pinch at [phone]  Joylene John, NP at [phone]  After Hours: call [phone] and have the GYN Oncologist paged/contacted

## 2021-04-11 NOTE — Anesthesia Postprocedure Evaluation (Signed)
Anesthesia Post Note  Patient: [name]  Procedure(s) Performed: XI ROBOTIC ASSISTED HYSTERECTOMY WITH BILATERAL SALPINGO OOPHORECTOMY; OMENTAL BIOPSY (Bilateral) SENTINEL NODE BIOPSY CYSTOSCOPY     Patient location during evaluation: PACU Anesthesia Type: General Level of consciousness: awake and alert Pain management: pain level controlled Vital Signs Assessment: post-procedure vital signs reviewed and stable Respiratory status: spontaneous breathing, nonlabored ventilation and respiratory function stable Cardiovascular status: blood pressure returned to baseline and stable Postop Assessment: no apparent nausea or vomiting Anesthetic complications: no   No notable events documented.  Last Vitals:  Vitals:   04/11/21 1100 04/11/21 1106  BP: (!) 158/89 (!) 146/96  Pulse: 90 91  Resp: 16 12  Temp:  36.6 C  SpO2: 98% 93%    Last Pain:  Vitals:   04/11/21 1106  TempSrc: Oral  PainSc: 0-No pain                 Lidia Collum

## 2021-04-11 NOTE — Interval H&P Note (Signed)
History and Physical Interval Note:  04/11/2021 6:56 AM  [name]  has presented today for surgery, with the diagnosis of ENDOMETRIAL CANCER.  The various methods of treatment have been discussed with the patient and family. After consideration of risks, benefits and other options for treatment, the patient has consented to  Procedure(s): XI ROBOTIC ASSISTED BILATERAL SALPINGO OOPHORECTOMY, POSSIBLE LAPAROTOMY (Bilateral) SENTINEL NODE BIOPSY (N/A) POSSIBLE LYMPH NODE DISSECTION (N/A) as a surgical intervention.  The patient's history has been reviewed, patient examined, no change in status, stable for surgery.  I have reviewed the patient's chart and labs.  Questions were answered to the patient's satisfaction.     [name]

## 2021-04-11 NOTE — Anesthesia Procedure Notes (Signed)
Procedure Name: Intubation Date/Time: 04/11/2021 7:41 AM Performed by: Lidia Collum, MD Pre-anesthesia Checklist: Patient identified, Emergency Drugs available, Suction available and Patient being monitored Patient Re-evaluated:Patient Re-evaluated prior to induction Oxygen Delivery Method: Circle system utilized Preoxygenation: Pre-oxygenation with 100% oxygen Induction Type: IV induction Ventilation: Mask ventilation without difficulty Laryngoscope Size: Glidescope and 3 Grade View: Grade II Tube type: Oral Tube size: 7.0 mm Number of attempts: 2 Airway Equipment and Method: Stylet and Video-laryngoscopy Placement Confirmation: ETT inserted through vocal cords under direct vision, positive ETCO2 and breath sounds checked- equal and bilateral Secured at: 22 cm Tube secured with: Tape Dental Injury: Teeth and Oropharynx as per pre-operative assessment  Difficulty Due To: Difficulty was anticipated and Difficult Airway- due to reduced neck mobility Comments: Elective glidescope used given limited neck ROM and goiter. CRNA Glidescope S3, grade 2b-3 view. Tube placed in esophagus. Mask ventilation. S3 by Dr. Christella Hartigan, grade 2 view. Tube placed successfully.

## 2021-04-12 ENCOUNTER — Telehealth: Payer: Self-pay

## 2021-04-12 ENCOUNTER — Other Ambulatory Visit: Payer: Self-pay

## 2021-04-12 ENCOUNTER — Encounter (HOSPITAL_COMMUNITY): Payer: Self-pay | Admitting: Gynecologic Oncology

## 2021-04-12 LAB — CYTOLOGY - NON PAP

## 2021-04-12 NOTE — Telephone Encounter (Signed)
Following up with [name] this afternoon. Per [name] patient can get her COVID booster 2 weeks from her surgery. [name] discussed the nature of high grade cancers with patient and the possible need for additional treatment. Advised patient that if she delays getting her COVID booster she me not be able to get it while receiving treatment. Patient verbalized understanding.

## 2021-04-12 NOTE — Telephone Encounter (Signed)
Spoke with [name] this morning. She states she is eating and drinking well. She endorses some burning with urination but she states this is improving. She has not had a BM yet and is not passing gas. She is taking senokot as prescribed and encouraged her to drink plenty of water. She denies fever or chills. She states one of her incisions near her belly button is slightly open, she put a band-aid over it. She denies drainage or discharge. Instructed her to notify our office is she has bleeding or drainage from the site. She rates her pain 3/10. Her pain is controlled with alternating tylenol and ibuprofen.     Patient inquiring how soon after surgery she can get her COVID booster. Advised patient that I would reach out to [name] and follow up with the patient.   Instructed to call office with any fever, chills, purulent drainage, uncontrolled pain or any other questions or concerns. Patient verbalizes understanding.   Pt aware of post op appointments as well as the office number [phone] and after hours number [phone] to call if she has any questions or concerns

## 2021-04-17 ENCOUNTER — Telehealth: Payer: Self-pay | Admitting: Oncology

## 2021-04-17 ENCOUNTER — Inpatient Hospital Stay: Payer: Medicare Other | Attending: Gynecologic Oncology | Admitting: Gynecologic Oncology

## 2021-04-17 ENCOUNTER — Ambulatory Visit: Payer: Medicare Other

## 2021-04-17 ENCOUNTER — Ambulatory Visit: Payer: Medicare Other | Admitting: Radiation Oncology

## 2021-04-17 ENCOUNTER — Encounter: Payer: Self-pay | Admitting: Gynecologic Oncology

## 2021-04-17 DIAGNOSIS — K589 Irritable bowel syndrome without diarrhea: Secondary | ICD-10-CM | POA: Insufficient documentation

## 2021-04-17 DIAGNOSIS — Z85118 Personal history of other malignant neoplasm of bronchus and lung: Secondary | ICD-10-CM | POA: Insufficient documentation

## 2021-04-17 DIAGNOSIS — Z90710 Acquired absence of both cervix and uterus: Secondary | ICD-10-CM

## 2021-04-17 DIAGNOSIS — Z923 Personal history of irradiation: Secondary | ICD-10-CM | POA: Insufficient documentation

## 2021-04-17 DIAGNOSIS — K219 Gastro-esophageal reflux disease without esophagitis: Secondary | ICD-10-CM | POA: Insufficient documentation

## 2021-04-17 DIAGNOSIS — C541 Malignant neoplasm of endometrium: Secondary | ICD-10-CM | POA: Insufficient documentation

## 2021-04-17 DIAGNOSIS — Z803 Family history of malignant neoplasm of breast: Secondary | ICD-10-CM | POA: Insufficient documentation

## 2021-04-17 DIAGNOSIS — M1990 Unspecified osteoarthritis, unspecified site: Secondary | ICD-10-CM | POA: Insufficient documentation

## 2021-04-17 DIAGNOSIS — G620 Drug-induced polyneuropathy: Secondary | ICD-10-CM | POA: Insufficient documentation

## 2021-04-17 DIAGNOSIS — Z800 Family history of malignant neoplasm of digestive organs: Secondary | ICD-10-CM | POA: Insufficient documentation

## 2021-04-17 DIAGNOSIS — I700 Atherosclerosis of aorta: Secondary | ICD-10-CM | POA: Insufficient documentation

## 2021-04-17 DIAGNOSIS — Z90722 Acquired absence of ovaries, bilateral: Secondary | ICD-10-CM

## 2021-04-17 DIAGNOSIS — Z9221 Personal history of antineoplastic chemotherapy: Secondary | ICD-10-CM | POA: Insufficient documentation

## 2021-04-17 DIAGNOSIS — Z7189 Other specified counseling: Secondary | ICD-10-CM

## 2021-04-17 NOTE — Progress Notes (Signed)
Gynecologic Oncology Telehealth Consult Note: Gyn-Onc  I connected with [name] on 04/17/21 at 12:15 PM EST by telephone and verified that I am speaking with the correct person using two identifiers.  I discussed the limitations, risks, security and privacy concerns of performing an evaluation and management service by telemedicine and the availability of in-person appointments. I also discussed with the patient that there may be a patient responsible charge related to this service. The patient expressed understanding and agreed to proceed.  Other persons participating in the visit and their role in the encounter: None.  Patient's location: Home Provider's location: [facility]  Reason for Visit: Follow-up after surgery, treatment discussion  Treatment History: Oncology History Overview Note  CA-125 on 04/06/21: 10.2   Lung cancer, diagnosed in March of 2006 stage II a status post left trisegmentectomy with lymph node dissection followed by 4 cycles of adjuvant chemotherapy (Resolved)  04/28/2012 Initial Diagnosis   Lung cancer, diagnosed in March of 2006 stage II a status post left trisegmentectomy with lymph node dissection followed by 4 cycles of adjuvant chemotherapy   05/06/2018 Genetic Testing   MEN1 c.875C>T and MSH3 c.1366G>A VUS identified on the multicancer gene panel.  The Multi-Gene Panel offered by [facility] includes sequencing and/or deletion duplication testing of the following 84 genes: AIP, ALK, APC, ATM, AXIN2,BAP1,  BARD1, BLM, BMPR1A, BRCA1, BRCA2, BRIP1, CASR, CDC73, CDH1, CDK4, CDKN1B, CDKN1C, CDKN2A (p14ARF), CDKN2A (p16INK4a), CEBPA, CHEK2, CTNNA1, DICER1, DIS3L2, EGFR (c.2369C>T, p.Thr790Met variant only), EPCAM (Deletion/duplication testing only), FH, FLCN, GATA2, GPC3, GREM1 (Promoter region deletion/duplication testing only), HOXB13 (c.251G>A, p.Gly84Glu), HRAS, KIT, MAX, MEN1, MET, MITF (c.952G>A, p.Glu318Lys variant only), MLH1, MSH2, MSH3, MSH6,  MUTYH, NBN, NF1, NF2, NTHL1, PALB2, PDGFRA, PHOX2B, PMS2, POLD1, POLE, POT1, PRKAR1A, PTCH1, PTEN, RAD50, RAD51C, RAD51D, RB1, RECQL4, RET, RUNX1, SDHAF2, SDHA (sequence changes only), SDHB, SDHC, SDHD, SMAD4, SMARCA4, SMARCB1, SMARCE1, STK11, SUFU, TERC, TERT, TMEM127, TP53, TSC1, TSC2, VHL, WRN and WT1.  The report date is May 06, 2018.   Ductal carcinoma in situ of right breast diagnosed in December of 2013, status post right lumpectomy in January of 2014  04/28/2012 Initial Diagnosis   Ductal carcinoma in situ of right breast diagnosed in December of 2013, status post right lumpectomy in January of 2014   05/06/2018 Genetic Testing   MEN1 c.875C>T and MSH3 c.1366G>A VUS identified on the multicancer gene panel.  The Multi-Gene Panel offered by [facility] includes sequencing and/or deletion duplication testing of the following 84 genes: AIP, ALK, APC, ATM, AXIN2,BAP1,  BARD1, BLM, BMPR1A, BRCA1, BRCA2, BRIP1, CASR, CDC73, CDH1, CDK4, CDKN1B, CDKN1C, CDKN2A (p14ARF), CDKN2A (p16INK4a), CEBPA, CHEK2, CTNNA1, DICER1, DIS3L2, EGFR (c.2369C>T, p.Thr790Met variant only), EPCAM (Deletion/duplication testing only), FH, FLCN, GATA2, GPC3, GREM1 (Promoter region deletion/duplication testing only), HOXB13 (c.251G>A, p.Gly84Glu), HRAS, KIT, MAX, MEN1, MET, MITF (c.952G>A, p.Glu318Lys variant only), MLH1, MSH2, MSH3, MSH6, MUTYH, NBN, NF1, NF2, NTHL1, PALB2, PDGFRA, PHOX2B, PMS2, POLD1, POLE, POT1, PRKAR1A, PTCH1, PTEN, RAD50, RAD51C, RAD51D, RB1, RECQL4, RET, RUNX1, SDHAF2, SDHA (sequence changes only), SDHB, SDHC, SDHD, SMAD4, SMARCA4, SMARCB1, SMARCE1, STK11, SUFU, TERC, TERT, TMEM127, TP53, TSC1, TSC2, VHL, WRN and WT1.  The report date is May 06, 2018.   Endometrial carcinoma ([place])  03/06/2021 Initial Biopsy   Endometrial biopsy revealed FIGO grade 2/3 endometrial cancer   03/24/2021 Initial Diagnosis   Endometrial carcinoma ([place])   03/30/2021 Imaging   CT A/P: 1. Abnormal thickening of the  endometrium, as seen on prior ultrasound and consistent with recently diagnosed  endometrial malignancy. 2. No evidence of lymphadenopathy or metastatic disease in the abdomen or pelvis. 3. Subcapsular hyperenhancing lesion of the lateral inferior right lobe of the liver, hepatic segment VI is only very faintly appreciated on this portal venous phase contrast examination, better characterized as a benign focal nodular hyperplasia by prior MR. 4. Diverticulosis.   04/11/2021 Surgery   Robotic-assisted laparoscopic total hysterectomy with bilateral salpingoophorectomy, SLN biopsy, omental biopsy, cystoscopy   Findings: On EUA, small mobile uterus. On intra-abdominal entry, normal upper abdominal survey. Normal omentum, small bowel. Large bowels with some filmy adhesions to the left pelvic sidewall and the left adhesions. Filmy adhesions also noted from the sigmoid to the posterior uterus (some of the adhesions biopsied). Uterus 8cm and normal in appearance. Atrophic appearing adnexa. Mapping successful to bilateral SLNs. No obvious adenopathy. Minimal adhesions of the bladder to the LUS. No intra-abdominal or pelvic evidence of disease. Given concentrated appearing urine and low UOP, cystoscopy was performed. Bladder dome intact (bubbles seen). Good efflux noted from bilateral ureteral orifices.    04/11/2021 Pathologic Stage   Stage IA serous uterine cancer No LVI, negative SLNs Omentum negative, washings nondiagnostic (insufficient cellularity) HER2 negative     Interval History: Patient reports overall doing well.  Denies any significant abdominal or pelvic pain.  Using Tylenol only as needed.  Bowel function is mostly back to normal, using Senokot just as needed.  Denies any changes to urinary function.  Tolerating regular diet without nausea or emesis.  Minimal clear discharge with faint blood-tinged tint, not enough to wear a panty liner.  Past Medical/Surgical History: Past Medical  History:  Diagnosis Date   Anxiety    Breast cancer ([place])    2014 and 2015   Cancer ([place])    lung cancer    DDD (degenerative disc disease)    neck   Dry eye    Eczema    Family history of breast cancer    Family history of stomach cancer    GERD (gastroesophageal reflux disease)    occasional - no current med.   Goiter    History of breast cancer    History of lung cancer    non-small cell - left upper lobe   IBS (irritable bowel syndrome)    Non-allergic rhinitis    sinusitis   Osteoarthritis    Personal history of radiation therapy 2014   PONV (postoperative nausea and vomiting)    Radiation 07/03/12-07/09/12   Right upper lung 5400 cGy   Thyroid nodule, toxic or with hyperthyroidism    no current med.   Urinary, incontinence, stress female    Vasomotor rhinitis    Vertigo     Past Surgical History:  Procedure Laterality Date   ANTERIOR CERVICAL DECOMP/DISCECTOMY FUSION N/A 12/22/2019   Procedure: Cervical four-five Cervical five-six Cervical six-seven Anterior cervical decompression/discectomy/fusion;  Surgeon: Erline Levine, MD;  Location: [place];  Service: Neurosurgery;  Laterality: N/A;   APPENDECTOMY  1971   BREAST LUMPECTOMY Right 2014   BREAST RECONSTRUCTION WITH PLACEMENT OF TISSUE EXPANDER AND FLEX HD (ACELLULAR HYDRATED DERMIS) Right 05/20/2013   Procedure: IMMEDIATE RIGHT BREAST RECONSTRUCTION WITH LATISSAMUS MUSCLE FLAP AND PLACEMENT OF TISSUE EXPANDER TO RIGHT BREAST;  Surgeon: Theodoro Kos, DO;  Location: [place];  Service: Plastics;  Laterality: Right;   BREAST REDUCTION WITH MASTOPEXY Left 10/22/2013   Procedure: BREAST REDUCTION/MASTOPEXY TO LEFT BREAST WITH LIPOSUCTION/FAT GRAFTING FOR SYMMETRY;  Surgeon: Theodoro Kos, DO;  Location: [place];  Service: Plastics;  Laterality: Left;   CYSTOSCOPY N/A 04/11/2021   Procedure: CYSTOSCOPY;  Surgeon: [name], MD;  Location: [facility];  Service: Gynecology;  Laterality: N/A;   DE  QUERVAIN'S RELEASE Left 07/31/2002   FRACTURE SURGERY Left 2019   Torn meniscus; bone fragments removed   ganglion cyst R hand     1980s   KNEE SURGERY Left    torn meniscus   LUNG REMOVAL, PARTIAL     2014   MASTECTOMY Right    2015   MASTECTOMY W/ SENTINEL NODE BIOPSY Right 05/20/2013   Procedure: RIGHT SIMPLEMASTECTOMY AND SENTINEL NODE   Sampling;  Surgeon: Joyice Faster. Cornett, MD;  Location: [place];  Service: General;  Laterality: Right;   MOUTH SURGERY Right may 2014   "keratocystic odotogenic tumor" - X 2   NASAL SINUS SURGERY  ?1988-1990   "at least 3" (05/21/2013)   PARTIAL MASTECTOMY WITH NEEDLE LOCALIZATION  04/10/2012   Procedure: PARTIAL MASTECTOMY WITH NEEDLE LOCALIZATION;  Surgeon: Marcello Moores A. Cornett, MD;  Location: [place];  Service: General;  Laterality: Right;   REDUCTION MAMMAPLASTY Left 2015   REMOVAL OF TISSUE EXPANDER AND PLACEMENT OF IMPLANT Right 10/22/2013   Procedure: REMOVAL OF RIGHT TISSUE EXPANDER WITH PLACEMENT OF RIGHT BREAST IMPLANT;  Surgeon: Theodoro Kos, DO;  Location: [place];  Service: Plastics;  Laterality: Right;   ROBOTIC ASSISTED BILATERAL SALPINGO OOPHERECTOMY Bilateral 04/11/2021   Procedure: XI ROBOTIC ASSISTED HYSTERECTOMY WITH BILATERAL SALPINGO OOPHORECTOMY; OMENTAL BIOPSY;  Surgeon: [name], MD;  Location: [facility];  Service: Gynecology;  Laterality: Bilateral;   SENTINEL NODE BIOPSY N/A 04/11/2021   Procedure: SENTINEL NODE BIOPSY;  Surgeon: [name], MD;  Location: [facility];  Service: Gynecology;  Laterality: N/A;   SEPTOPLASTY     & sinus surgeries 1980s-1990s   THORACOTOMY Left 07/14/2004   TRIGGER FINGER RELEASE Bilateral    "I've had OR on q finger" (05/21/2013)   TRIGGER FINGER RELEASE Right 07/14/1999   index and little fingers   TRIGGER FINGER RELEASE Right 07/17/2002   long and ring fingers   TRIGGER FINGER RELEASE Left 07/31/2002   little, ring and index fingers   VIDEO ASSISTED  THORACOSCOPY (VATS)/ LOBECTOMY Left 07/14/2004   upper tri-segmentectomy   WISDOM TOOTH EXTRACTION     "3"    Family History  Problem Relation Age of Onset   Anesthesia problems Mother        post-op N/V   Thyroid nodules Mother    Other Mother        DDD, spinal stenosis   High blood pressure Mother    Hearing loss Mother    Leukemia Father        CML, d. 40s   High blood pressure Father    Hearing loss Father    Breast cancer Sister        BRCA testing in 2006 timeframe is neg   Thyroid nodules Sister    Thyroid nodules Sister    Hearing loss Brother    Breast cancer Maternal Aunt        dx >50   Thyroid nodules Maternal Aunt    Thyroid nodules Maternal Aunt    Hearing loss Paternal Aunt    Hearing loss Paternal Uncle    Stomach cancer Maternal Grandmother    Anesthesia problems Daughter        post-op N/V   Stomach cancer Maternal Great-grandmother        assumed stomach cancer  Prostate cancer Neg Hx    Pancreatic cancer Neg Hx    Endometrial cancer Neg Hx    Ovarian cancer Neg Hx    Colon cancer Neg Hx     Social History   Socioeconomic History   Marital status: Married    Spouse name: Not on file   Number of children: 1   Years of education: Not on file   Highest education level: Not on file  Occupational History    Employer: [employer]  Tobacco Use   Smoking status: Former    Types: Cigarettes    Quit date: 05/06/2004    Years since quitting: 16.9   Smokeless tobacco: Never  Vaping Use   Vaping Use: Never used  Substance and Sexual Activity   Alcohol use: Yes    Alcohol/week: 7.0 standard drinks    Types: 7 Glasses of wine per week    Comment: 1 glass wine daily   Drug use: Never   Sexual activity: Not Currently  Other Topics Concern   Not on file  Social History Narrative   Lives at home with spouse   Right handed   Caffeine: 2-3 cups of coffee/day   Social Determinants of Health   Financial Resource Strain: Not on file   Food Insecurity: Not on file  Transportation Needs: Not on file  Physical Activity: Not on file  Stress: Not on file  Social Connections: Not on file    Current Medications:  Current Outpatient Medications:    acetaminophen (TYLENOL) 500 MG tablet, Take 1,000 mg by mouth at bedtime as needed (Arthritis pain)., Disp: , Rfl:    cyclobenzaprine (FLEXERIL) 10 MG tablet, Take 5-10 mg by mouth at bedtime as needed for muscle spasms., Disp: , Rfl: 0   cycloSPORINE (RESTASIS) 0.05 % ophthalmic emulsion, Place 1 drop into both eyes 2 (two) times daily. , Disp: , Rfl:    esomeprazole (NEXIUM) 20 MG capsule, Take 20 mg by mouth daily., Disp: , Rfl:    fluocinonide gel (LIDEX) 7.41 %, Apply 1 application topically daily as needed (canker sores). , Disp: , Rfl:    fluticasone (FLONASE) 50 MCG/ACT nasal spray, Place 2 sprays into both nostrils daily. , Disp: , Rfl:    hyoscyamine (LEVSIN) 0.125 MG tablet, Take 0.125 mg by mouth 3 (three) times daily as needed (IBS)., Disp: , Rfl:    ibuprofen (ADVIL) 600 MG tablet, Take 1 tablet (600 mg total) by mouth every 6 (six) hours as needed for moderate pain. For AFTER surgery only, Disp: 30 tablet, Rfl: 0   Lactobacillus (ACIDOPHILUS PO), Take 5 mg by mouth daily., Disp: , Rfl:    MELOXICAM PO, Take 15 mg by mouth daily as needed (Arthritis pain)., Disp: , Rfl:    Multiple Vitamin (MULTIVITAMIN) capsule, Take 1 capsule by mouth in the morning. , Disp: , Rfl:    Olopatadine HCl 0.2 % SOLN, Place 1 drop into both eyes daily as needed (Allergies/Seasonal)., Disp: , Rfl:    oxyCODONE (OXY IR/ROXICODONE) 5 MG immediate release tablet, Take 1 tablet (5 mg total) by mouth every 4 (four) hours as needed for severe pain. For AFTER surgery only, do not take and drive, Disp: 10 tablet, Rfl: 0   Peppermint Oil (IBGARD PO), Take 180 mg by mouth daily as needed (IBS)., Disp: , Rfl:    Polyethyl Glycol-Propyl Glycol (SYSTANE) 0.4-0.3 % SOLN, Place 1 drop into both eyes daily  as needed (Dry eye)., Disp: , Rfl:  senna-docusate (SENOKOT-S) 8.6-50 MG tablet, Take 2 tablets by mouth at bedtime. For AFTER surgery, do not take if having diarrhea, Disp: 30 tablet, Rfl: 0   vitamin E 400 UNIT capsule, Take 400 Units by mouth daily. , Disp: , Rfl:   Review of Symptoms: Pertinent positives as per HPI.    Physical Exam: There were no vitals taken for this visit. Deferred given limitations of phone visit.  Laboratory & Radiologic Studies: None new  Assessment & Plan: [name] is a 71 y.o. woman with Stage IA serous uterine cancer who presents for phone follow-up after surgery, treatment discussion.  The patient is meeting postoperative milestones.  Discussed continued expectations as well as postoperative limitations.  Discussed pathology report in detail.  Even in the setting of early stage disease, given high risk histology, discussed recommendation for adjuvant treatment.  Reviewed that this would include both systemic chemotherapy as well as vaginal brachytherapy.  Reviewed with the patient systemic treatment plan as well as chemotherapy used.  She has previously received 4 cycles of adjuvant carboplatin and paclitaxel in the setting of her lung cancer in 2006.  She would be retreated with these agents.  We discussed the possibility of reaction to carboplatin with increased exposure to the agent.  Patient has been followed by Dr. Julien Nordmann.  I discussed that I will have Santiago Glad, our nurse navigator, reach out to him in terms of his preference of seeing the patient for discussion of adjuvant therapy versus having her see Dr. Alvy Bimler.  We will also coordinate consultation with Dr. Sondra Come to discuss adjuvant vaginal brachytherapy.  I discussed the assessment and treatment plan with the patient. The patient was provided with an opportunity to ask questions and all were answered. The patient agreed with the plan and demonstrated an understanding of the instructions.    The patient was advised to call back or see an in-person evaluation if the symptoms worsen or if the condition fails to improve as anticipated.   19 minutes of total time was spent for this patient encounter, including preparation, face-to-face counseling with the patient and coordination of care, and documentation of the encounter.   Jeral Pinch, MD  Division of Gynecologic Oncology  Department of Obstetrics and Gynecology  [facility]

## 2021-04-17 NOTE — Telephone Encounter (Signed)
Called [name] and advised her that Dr. Julien Nordmann would like her to see Dr. Alvy Bimler for chemotherapy per his nurse.  Scheduled new patient appointment for Friday, 04/21/21 at 2:00 with 1:30 arrival.   Also discussed that Radiation Oncology will call her to schedule an appointment with Dr. Sondra Come.  She verbalized understanding and agreement of above information.

## 2021-04-18 ENCOUNTER — Inpatient Hospital Stay: Payer: Medicare Other | Admitting: Gynecologic Oncology

## 2021-04-18 ENCOUNTER — Encounter: Payer: Self-pay | Admitting: Hematology and Oncology

## 2021-04-19 NOTE — Progress Notes (Signed)
GYN Location of Tumor / Histology: Endometrial  [name] presented with symptoms of: postmenopausal bleeding  Biopsies revealed: Endometrium, biopsy - HIGH GRADE ADENOCARCINOMA.  A. LYMPH NODE, SENTINEL, RIGHT OBTURATOR, AND SURROUNDING LYMPH NODES:  Four benign lymph nodes, negative for carcinoma, IHC negative (0/4)   B. LYMPH NODE, SENTINEL, LEFT OBTURATOR, RESECTION:  One benign lymph node, negative for carcinoma, IHC negative (0/1)   C. LYMPH NODE, SENTINEL, LEFT EXTERNAL ILIAC, EXCISION:  One benign lymph node, negative for carcinoma, IHC negative (0/1)   D. PERITONEAL ADHESION:  Benign mesothelium with dystrophic calcification exhibiting ossification  Small benign mesothelial cysts  Negative for carcinoma   E. UTERUS, CERVIX, BILATERAL FALLOPIAN TUBES AND OVARIES:  Serous carcinoma arising in an apparent endometrial polyp  Tumor superficially invades the myometrium (2 mm of 20 mm)  Background focal complex atypical hyperplasia/intraepithelial carcinoma  Minute benign papillary cystadenofibroma of the right ovary  Benign left ovary and right and left fallopian tubes   F. OMENTUM, BIOPSY:  Benign omentum, negative for carcinoma   Past/Anticipated interventions by Gyn/Onc surgery, if any:  Operation: Robotic-assisted laparoscopic total hysterectomy with bilateral salpingoophorectomy, SLN biopsy, omental biopsy, cystoscopy  Surgeon: Jeral Pinch MD  Past/Anticipated interventions by medical oncology, if any: Dr Alvy Bimler:  05/09/2021 -  Chemotherapy    Patient is on Treatment Plan : UTERINE Carboplatin AUC 6 / Paclitaxel q21d    PRIOR THERAPY: 1) status post left trisegmentectomy with node dissection on 06/24/2004 2) status post 4 cycles of adjuvant chemotherapy with carboplatin and paclitaxel last dose was given 10/24/2004. 3) status post right partial mastectomy on 04/10/2012 under the care of Dr. Brantley Stage. 4) status post adjuvant radiotherapy to the right  breast under the care of Dr. Valere Dross   CURRENT THERAPY: Tamoxifen 20 mg by mouth daily started in May of 2014.  Weight changes, if any: no  Bowel/Bladder complaints, if any: Yes.  ,  has IBS, incontinence, frequency, occasional urgency, nocturia x2-4  Nausea/Vomiting, if any: no  Pain issues, if any:  yes, pelvic pain rated 1/10  SAFETY ISSUES: Prior radiation? yes, right breast 2014 with Dr Valere Dross Pacemaker/ICD? no Possible current pregnancy? no, hysterectomy Is the patient on methotrexate? no  Current Complaints / other details:  presented with postmenopausal bleeding  Vitals:   04/26/21 0915  BP: (!) 158/69  Pulse: 82  Resp: 18  Temp: (!) 96.6 F (35.9 C)  TempSrc: Temporal  SpO2: 100%  Height: 5\' 3"  (1.6 m)

## 2021-04-20 ENCOUNTER — Other Ambulatory Visit: Payer: Self-pay | Admitting: Hematology and Oncology

## 2021-04-20 ENCOUNTER — Telehealth: Payer: Self-pay | Admitting: Oncology

## 2021-04-20 DIAGNOSIS — C541 Malignant neoplasm of endometrium: Secondary | ICD-10-CM

## 2021-04-20 NOTE — Progress Notes (Signed)
START ON PATHWAY REGIMEN - Uterine     A cycle is every 21 days:     Paclitaxel      Carboplatin   **Always confirm dose/schedule in your pharmacy ordering system**  Patient Characteristics: Serous Carcinoma, Newly Diagnosed, Postoperative (Pathologic Staging), Postoperative Histology: Serous Carcinoma Therapeutic Status: Newly Diagnosed, Postoperative (Pathologic Staging) AJCC M Category: cM0 AJCC 8 Stage Grouping: I AJCC T Category: pT1 AJCC N Category: pN0 Intent of Therapy: Curative Intent, Discussed with Patient

## 2021-04-20 NOTE — Telephone Encounter (Signed)
[name] and rescheduled new patient appointment with Dr. Alvy Wagner to 04/25/21.  Also discussed need for porta cath placement for chemotherapy.  [name] would like to schedule the appointment for port placement.  Advised her that we will be calling with an appointment.

## 2021-04-20 NOTE — Addendum Note (Signed)
Addended by: Jacqulyn Liner on: 04/20/2021 09:34 AM   Modules accepted: Orders

## 2021-04-20 NOTE — Telephone Encounter (Signed)
Called [name] and advised her of port placement appointment on 05/04/21 with arrival at 12:30 to [place] her of instructions for NPO after 8:00 and that she will need a driver.  Also went over all other appointments and encouraged her to call with any questions or needs.

## 2021-04-21 ENCOUNTER — Inpatient Hospital Stay: Payer: Medicare Other | Admitting: Hematology and Oncology

## 2021-04-25 ENCOUNTER — Inpatient Hospital Stay: Payer: Medicare Other | Admitting: Hematology and Oncology

## 2021-04-25 ENCOUNTER — Encounter: Payer: Self-pay | Admitting: Hematology and Oncology

## 2021-04-25 ENCOUNTER — Other Ambulatory Visit: Payer: Self-pay

## 2021-04-25 VITALS — BP 151/70 | HR 84 | Temp 98.4°F | Resp 18 | Ht 63.0 in | Wt 156.8 lb

## 2021-04-25 DIAGNOSIS — C541 Malignant neoplasm of endometrium: Secondary | ICD-10-CM

## 2021-04-25 DIAGNOSIS — Z803 Family history of malignant neoplasm of breast: Secondary | ICD-10-CM | POA: Diagnosis not present

## 2021-04-25 DIAGNOSIS — Z85118 Personal history of other malignant neoplasm of bronchus and lung: Secondary | ICD-10-CM | POA: Diagnosis not present

## 2021-04-25 DIAGNOSIS — I700 Atherosclerosis of aorta: Secondary | ICD-10-CM | POA: Diagnosis not present

## 2021-04-25 DIAGNOSIS — Z923 Personal history of irradiation: Secondary | ICD-10-CM | POA: Diagnosis not present

## 2021-04-25 DIAGNOSIS — T451X5A Adverse effect of antineoplastic and immunosuppressive drugs, initial encounter: Secondary | ICD-10-CM | POA: Diagnosis not present

## 2021-04-25 DIAGNOSIS — K589 Irritable bowel syndrome without diarrhea: Secondary | ICD-10-CM | POA: Diagnosis not present

## 2021-04-25 DIAGNOSIS — G620 Drug-induced polyneuropathy: Secondary | ICD-10-CM | POA: Diagnosis not present

## 2021-04-25 DIAGNOSIS — K219 Gastro-esophageal reflux disease without esophagitis: Secondary | ICD-10-CM | POA: Diagnosis not present

## 2021-04-25 DIAGNOSIS — Z800 Family history of malignant neoplasm of digestive organs: Secondary | ICD-10-CM | POA: Diagnosis not present

## 2021-04-25 DIAGNOSIS — Z9221 Personal history of antineoplastic chemotherapy: Secondary | ICD-10-CM | POA: Diagnosis not present

## 2021-04-25 DIAGNOSIS — M1990 Unspecified osteoarthritis, unspecified site: Secondary | ICD-10-CM | POA: Diagnosis not present

## 2021-04-25 LAB — SURGICAL PATHOLOGY

## 2021-04-25 MED ORDER — ONDANSETRON HCL 8 MG PO TABS: 8.0000 mg | ORAL_TABLET | Freq: Two times a day (BID) | ORAL | 1 refills | Status: DC | PRN

## 2021-04-25 MED ORDER — DEXAMETHASONE 4 MG PO TABS: ORAL_TABLET | ORAL | 6 refills | Status: DC

## 2021-04-25 MED ORDER — PROCHLORPERAZINE MALEATE 10 MG PO TABS: 10.0000 mg | ORAL_TABLET | Freq: Four times a day (QID) | ORAL | 1 refills | Status: DC | PRN

## 2021-04-25 MED ORDER — LIDOCAINE-PRILOCAINE 2.5-2.5 % EX CREA: TOPICAL_CREAM | CUTANEOUS | 3 refills | Status: DC

## 2021-04-25 NOTE — Progress Notes (Signed)
Radiation Oncology         [phone] ________________________________  Initial Outpatient Consultation  Name: [name] MRN: [id number]  Date: 04/26/2021  DOB: 10-21-1950  [id number], Carmen Saxon, MD  [name], MD   REFERRING PHYSICIAN: [name], MD  DIAGNOSIS: The encounter diagnosis was Endometrial carcinoma [facility]).  FIGO Stage IA (pT1a, pN0, cM0) Endometrial Cancer, high-grade serous  HISTORY OF PRESENT ILLNESS::[name] is a 71 y.o. female who is accompanied by her husband. she is seen as a courtesy of [name] for an opinion concerning radiation therapy as part of management for her recently diagnosed endometrial cancer. The patient a history significant for non-small cell lung cancer diagnosed in 2006 (s/p trisegmentectomy and adjuvant chemotherapy), and right breast DCIS diagnosed in January of 2014, with recurrence in October 2014 (s/p right partial mastectomy and XRT under Dr. Valere Dross). She has been on tamoxifen since 07/2012. Has been off tamoxifen since her vaginal bleeding started (detailed below).  The patient intimally presented with vaginal spotting on 02/02/21, which consisted of 4 days of spotting which she describes as seeing red when she wiped, and having a little brown discharge on her liner.  Her bleeding then stopped completely.    She then saw her primary care provider and underwent an ultrasound on 02/27/21 which showed a thickened endometrium, and a possible 14 mm endometrial mass.  She then started bleeding again on 03/08/21 and had a week of bleeding with a little bit more red blood.  She ultimately saw her OB/GYN and had an endometrial biopsy performed on 03/16/21 which revealed FIGO grade 2/3 endometrial cancer.  Subsequently, the patient was referred to [name] on 03/14/21 for further evaluation. During this visit, the patient reported minimal spotting after the biopsy which stopped within a couple of days.  She also reported  occasional cramping and a decreased appetite overall secondary to anxiety. She denied any nausea or emesis although reported some mild intermittent nausea at baseline in the setting of her IBS. She also reported some baseline urinary incontinence and frequency. Physical exam performed during this was unremarkable other than signs of healing from her recent biopsy.   CT of the abdomen and pelvis on 03/30/21 showed abnormal thickening of the endometrium, as seen on prior ultrasound, consistent with recently diagnosed endometrial malignancy. No evidence of lymphadenopathy or metastatic disease in the abdomen or pelvis were appreciated. (CT also showed a subcapsular hyperenhancing lesion of the lateral inferior right lobe of the liver, though this was poorly visualized).  Following discussion of the risks and benefits, the patient agreed to proceed with hysterectomy, BSO, omentum biopsy, and nodal biopsies on 04/11/21 under the care of [name]. Pathology from the procedure revealed high-grade serous carcinoma (Her2 negative) arising in an apparent endometrial polyp, with myometrial invasion measuring 2 mm. Serous carcinoma was noted with a background focal complex of atypical hyperplasia/intraepithelial carcinoma. Nodal status of 3/3 SNL's negative for carcinoma (right obturator, left obturator, and left external iliac sentinel lymph nodes). Omentum biopsy performed showed benign findings.   During post-up follow up with [name] on 04/17/21, the patient was noted to be recovering well. Systemic treatment options were discussed with the patient, and given that she was previously treated with 4 cycles of adjuvant carboplatin and paclitaxel in the setting of her lung cancer in 2006, [name] recommends treatment with these agents again.   Accordingly, [name] referred the patient to Dr. Alvy Bimler yesterday to discuss this further. Encounter details  are pending at this time.    PREVIOUS RADIATION  THERAPY: Yes, radiotherapy to the right breast under the care of Dr. Valere Dross  PAST MEDICAL HISTORY:  Past Medical History:  Diagnosis Date   Anxiety    Breast cancer ([place])    2014 and 2015   Cancer ([place])    lung cancer    DDD (degenerative disc disease)    neck   Dry eye    Eczema    Family history of breast cancer    Family history of stomach cancer    GERD (gastroesophageal reflux disease)    occasional - no current med.   Goiter    History of breast cancer    History of lung cancer    non-small cell - left upper lobe   IBS (irritable bowel syndrome)    Non-allergic rhinitis    sinusitis   Osteoarthritis    Personal history of radiation therapy 2014   PONV (postoperative nausea and vomiting)    Radiation 07/03/12-07/09/12   Right upper lung 5400 cGy   Thyroid nodule, toxic or with hyperthyroidism    no current med.   Urinary, incontinence, stress female    Vasomotor rhinitis    Vertigo     PAST SURGICAL HISTORY: Past Surgical History:  Procedure Laterality Date   ANTERIOR CERVICAL DECOMP/DISCECTOMY FUSION N/A 12/22/2019   Procedure: Cervical four-five Cervical five-six Cervical six-seven Anterior cervical decompression/discectomy/fusion;  Surgeon: Erline Levine, MD;  Location: [place];  Service: Neurosurgery;  Laterality: N/A;   APPENDECTOMY  1971   BREAST LUMPECTOMY Right 2014   BREAST RECONSTRUCTION WITH PLACEMENT OF TISSUE EXPANDER AND FLEX HD (ACELLULAR HYDRATED DERMIS) Right 05/20/2013   Procedure: IMMEDIATE RIGHT BREAST RECONSTRUCTION WITH LATISSAMUS MUSCLE FLAP AND PLACEMENT OF TISSUE EXPANDER TO RIGHT BREAST;  Surgeon: Theodoro Kos, DO;  Location: [place];  Service: Plastics;  Laterality: Right;   BREAST REDUCTION WITH MASTOPEXY Left 10/22/2013   Procedure: BREAST REDUCTION/MASTOPEXY TO LEFT BREAST WITH LIPOSUCTION/FAT GRAFTING FOR SYMMETRY;  Surgeon: Theodoro Kos, DO;  Location: [place];  Service: Plastics;  Laterality: Left;   CYSTOSCOPY N/A  04/11/2021   Procedure: CYSTOSCOPY;  Surgeon: [name], MD;  Location: [facility];  Service: Gynecology;  Laterality: N/A;   DE QUERVAIN'S RELEASE Left 07/31/2002   FRACTURE SURGERY Left 2019   Torn meniscus; bone fragments removed   ganglion cyst R hand     1980s   KNEE SURGERY Left    torn meniscus   LUNG REMOVAL, PARTIAL     2014   MASTECTOMY Right    2015   MASTECTOMY W/ SENTINEL NODE BIOPSY Right 05/20/2013   Procedure: RIGHT SIMPLEMASTECTOMY AND SENTINEL NODE   Sampling;  Surgeon: Joyice Faster. Cornett, MD;  Location: [place];  Service: General;  Laterality: Right;   MOUTH SURGERY Right may 2014   "keratocystic odotogenic tumor" - X 2   NASAL SINUS SURGERY  ?1988-1990   "at least 3" (05/21/2013)   PARTIAL MASTECTOMY WITH NEEDLE LOCALIZATION  04/10/2012   Procedure: PARTIAL MASTECTOMY WITH NEEDLE LOCALIZATION;  Surgeon: Marcello Moores A. Cornett, MD;  Location: [place];  Service: General;  Laterality: Right;   REDUCTION MAMMAPLASTY Left 2015   REMOVAL OF TISSUE EXPANDER AND PLACEMENT OF IMPLANT Right 10/22/2013   Procedure: REMOVAL OF RIGHT TISSUE EXPANDER WITH PLACEMENT OF RIGHT BREAST IMPLANT;  Surgeon: Theodoro Kos, DO;  Location: [place];  Service: Plastics;  Laterality: Right;   ROBOTIC ASSISTED BILATERAL SALPINGO OOPHERECTOMY Bilateral  04/11/2021   Procedure: XI ROBOTIC ASSISTED HYSTERECTOMY WITH BILATERAL SALPINGO OOPHORECTOMY; OMENTAL BIOPSY;  Surgeon: [name], MD;  Location: [facility];  Service: Gynecology;  Laterality: Bilateral;   SENTINEL NODE BIOPSY N/A 04/11/2021   Procedure: SENTINEL NODE BIOPSY;  Surgeon: [name], MD;  Location[facility];  Service: Gynecology;  Laterality: N/A;   SEPTOPLASTY     & sinus surgeries 1980s-1990s   THORACOTOMY Left 07/14/2004   TRIGGER FINGER RELEASE Bilateral    "I've had OR on q finger" (05/21/2013)   TRIGGER FINGER RELEASE Right 07/14/1999   index and little fingers   TRIGGER FINGER RELEASE  Right 07/17/2002   long and ring fingers   TRIGGER FINGER RELEASE Left 07/31/2002   little, ring and index fingers   VIDEO ASSISTED THORACOSCOPY (VATS)/ LOBECTOMY Left 07/14/2004   upper tri-segmentectomy   WISDOM TOOTH EXTRACTION     "3"    FAMILY HISTORY:  Family History  Problem Relation Age of Onset   Anesthesia problems Mother        post-op N/V   Thyroid nodules Mother    Other Mother        DDD, spinal stenosis   High blood pressure Mother    Hearing loss Mother    Leukemia Father        CML, d. 31s   High blood pressure Father    Hearing loss Father    Breast cancer Sister        BRCA testing in 2006 timeframe is neg   Thyroid nodules Sister    Thyroid nodules Sister    Hearing loss Brother    Breast cancer Maternal Aunt        dx >50   Thyroid nodules Maternal Aunt    Thyroid nodules Maternal Aunt    Hearing loss Paternal Aunt    Hearing loss Paternal Uncle    Stomach cancer Maternal Grandmother    Anesthesia problems Daughter        post-op N/V   Stomach cancer Maternal Great-grandmother        assumed stomach cancer   Prostate cancer Neg Hx    Pancreatic cancer Neg Hx    Endometrial cancer Neg Hx    Ovarian cancer Neg Hx    Colon cancer Neg Hx     SOCIAL HISTORY:  Social History   Tobacco Use   Smoking status: Former    Types: Cigarettes    Quit date: 05/06/2004    Years since quitting: 16.9   Smokeless tobacco: Never  Vaping Use   Vaping Use: Never used  Substance Use Topics   Alcohol use: Yes    Alcohol/week: 7.0 standard drinks    Types: 7 Glasses of wine per week    Comment: 1 glass wine daily   Drug use: Never    ALLERGIES:  Allergies  Allergen Reactions   Augmentin [Amoxicillin-Pot Clavulanate] Hives   Morphine And Related Shortness Of Breath    Causes shallow breathing   Amoxicillin Hives   Asa [Aspirin] Other (See Comments)    GI UPSET   Banana Other (See Comments)    GI UPSET; POSITIVE ON ALLERGY TEST   Ceftin [Cefuroxime  Axetil] Diarrhea   Citrus Other (See Comments)    **ORANGES**  POSITIVE ON ALLERGY TEST; GI UPSET   Lactose Other (See Comments)    GI UPSET   Lactose Intolerance (Gi) Other (See Comments)    GI UPSET   Orange Oil Diarrhea    POSITIVE  ON ALLERGY TEST; GI UPSET   Penicillins Hives   Cefuroxime Diarrhea    Other reaction(s): Unknown   Clavulanic Acid Other (See Comments)    unknown   Other Other (See Comments)    ORANGES BANANAS LACTOSE INTOLERANCE    Tetanus-Diphth-Acell Pertussis     Allergic to dipthuria only  Other reaction(s): severe local reaction    Tape Rash and Other (See Comments)    SKIN REDNESS BANDAIDS    MEDICATIONS:  Current Outpatient Medications  Medication Sig Dispense Refill   acetaminophen (TYLENOL) 500 MG tablet Take 1,000 mg by mouth at bedtime as needed (Arthritis pain).     cyclobenzaprine (FLEXERIL) 10 MG tablet Take 5-10 mg by mouth at bedtime as needed for muscle spasms.  0   cycloSPORINE (RESTASIS) 0.05 % ophthalmic emulsion Place 1 drop into both eyes 2 (two) times daily.      esomeprazole (NEXIUM) 20 MG capsule Take 20 mg by mouth daily.     fluocinonide gel (LIDEX) 5.68 % Apply 1 application topically daily as needed (canker sores).      fluticasone (FLONASE) 50 MCG/ACT nasal spray Place 2 sprays into both nostrils daily.      hyoscyamine (LEVSIN) 0.125 MG tablet Take 0.125 mg by mouth 3 (three) times daily as needed (IBS).     Lactobacillus (ACIDOPHILUS PO) Take 5 mg by mouth daily.     MELOXICAM PO Take 15 mg by mouth daily as needed (Arthritis pain).     Multiple Vitamin (MULTIVITAMIN) capsule Take 1 capsule by mouth in the morning.      Olopatadine HCl 0.2 % SOLN Place 1 drop into both eyes daily as needed (Allergies/Seasonal).     Peppermint Oil (IBGARD PO) Take 180 mg by mouth daily as needed (IBS).     Polyethyl Glycol-Propyl Glycol (SYSTANE) 0.4-0.3 % SOLN Place 1 drop into both eyes daily as needed (Dry eye).     vitamin E 400 UNIT  capsule Take 400 Units by mouth daily.      dexamethasone (DECADRON) 4 MG tablet Take 2 tabs at the night before and 2 tab the morning of chemotherapy, every 3 weeks, by mouth x 6 cycles (Patient not taking: Reported on 04/26/2021) 36 tablet 6   lidocaine-prilocaine (EMLA) cream Apply to affected area once (Patient not taking: Reported on 04/26/2021) 30 g 3   ondansetron (ZOFRAN) 8 MG tablet Take 1 tablet (8 mg total) by mouth 2 (two) times daily as needed. Start on the third day after chemotherapy. (Patient not taking: Reported on 04/26/2021) 30 tablet 1   prochlorperazine (COMPAZINE) 10 MG tablet Take 1 tablet (10 mg total) by mouth every 6 (six) hours as needed (Nausea or vomiting). (Patient not taking: Reported on 04/26/2021) 30 tablet 1   senna-docusate (SENOKOT-S) 8.6-50 MG tablet Take 2 tablets by mouth at bedtime. For AFTER surgery, do not take if having diarrhea (Patient not taking: Reported on 04/26/2021) 30 tablet 0   No current facility-administered medications for this encounter.    REVIEW OF SYSTEMS:  A 10+ POINT REVIEW OF SYSTEMS WAS OBTAINED including neurology, dermatology, psychiatry, cardiac, respiratory, lymph, extremities, GI, GU, musculoskeletal, constitutional, reproductive, HEENT.  She denies any vaginal bleeding or discharge since her surgery.  She denies any pelvic pain or problems with swelling in her lower extremities.  She denies any numbness along the thigh areas.   PHYSICAL EXAM:  height is _0  (1.6 m). Her temporal temperature is 96.6 F (35.9 C) (abnormal). Her blood pressure  is 158/69 (abnormal) and her pulse is 82. Her respiration is 18 and oxygen saturation is 100%.   General: Alert and oriented, in no acute distress HEENT: Head is normocephalic. Extraocular movements are intact. Neck: Neck is supple, no palpable cervical or supraclavicular lymphadenopathy. Heart: Regular in rate and rhythm with no murmurs, rubs, or gallops. Chest: Clear to auscultation bilaterally, with  no rhonchi, wheezes, or rales. Abdomen: Soft, nontender, nondistended, with no rigidity or guarding. Extremities: No cyanosis or edema. Lymphatics: see Neck Exam Skin: No concerning lesions. Musculoskeletal: symmetric strength and muscle tone throughout. Neurologic: Cranial nerves II through XII are grossly intact. No obvious focalities. Speech is fluent. Coordination is intact. Psychiatric: Judgment and insight are intact. Affect is appropriate. On pelvic examination deferred in light of recent surgery  ECOG = 1  0 - Asymptomatic (Fully active, able to carry on all predisease activities without restriction)  1 - Symptomatic but completely ambulatory (Restricted in physically strenuous activity but ambulatory and able to carry out work of a light or sedentary nature. For example, light housework, office work)  2 - Symptomatic, <50% in bed during the day (Ambulatory and capable of all self care but unable to carry out any work activities. Up and about more than 50% of waking hours)  3 - Symptomatic, >50% in bed, but not bedbound (Capable of only limited self-care, confined to bed or chair 50% or more of waking hours)  4 - Bedbound (Completely disabled. Cannot carry on any self-care. Totally confined to bed or chair)  5 - Death   Eustace Pen MM, Creech RH, Tormey DC, et al. [phone]). "Toxicity and response criteria of the [facility]". [place] Oncol. 5 (6): 649-55  LABORATORY DATA:  Lab Results  Component Value Date   WBC 6.6 04/06/2021   HGB 13.0 04/06/2021   HCT 41.1 04/06/2021   MCV 94.5 04/06/2021   PLT 377 04/06/2021   NEUTROABS 2.8 07/20/2020   Lab Results  Component Value Date   NA 137 04/06/2021   K 4.8 04/06/2021   CL 105 04/06/2021   CO2 25 04/06/2021   GLUCOSE 103 (H) 04/06/2021   CREATININE 0.78 04/06/2021   CALCIUM 9.2 04/06/2021      RADIOGRAPHY: CT Abdomen Pelvis W Contrast  Result Date: 03/30/2021 CLINICAL DATA:  New diagnosis  endometrial cancer staging, additional history of right breast cancer and lung cancer EXAM: CT ABDOMEN AND PELVIS WITH CONTRAST TECHNIQUE: Multidetector CT imaging of the abdomen and pelvis was performed using the standard protocol following bolus administration of intravenous contrast. CONTRAST:  82m OMNIPAQUE IOHEXOL 350 MG/ML SOLN, additional oral enteric contrast COMPARISON:  Pelvic ultrasound, 02/27/2021, MR abdomen, 11/30/2020 FINDINGS: Lower chest: No acute abnormality. Status post right mastectomy with implant reconstruction, partially imaged. Hepatobiliary: Subcapsular hyperenhancing lesion of the lateral inferior right lobe of the liver, hepatic segment VI is only very faintly appreciated on this portal venous phase contrast examination (series 2, image 19). No gallstones, gallbladder wall thickening, or biliary dilatation. Pancreas: Unremarkable. No pancreatic ductal dilatation or surrounding inflammatory changes. Spleen: Normal in size without significant abnormality. Adrenals/Urinary Tract: Adrenal glands are unremarkable. Kidneys are normal, without renal calculi, solid lesion, or hydronephrosis. Bladder is unremarkable. Stomach/Bowel: Stomach is within normal limits. Appendix is not clearly visualized and may be surgically absent. No evidence of bowel wall thickening, distention, or inflammatory changes. Descending and sigmoid diverticulosis. Vascular/Lymphatic: Aortic atherosclerosis. No enlarged abdominal or pelvic lymph nodes. Reproductive: Thickening of the endometrium, as seen on prior ultrasound (  series 6, image 59) Other: No abdominal wall hernia or abnormality. No abdominopelvic ascites. Musculoskeletal: No acute or significant osseous findings. IMPRESSION: 1. Abnormal thickening of the endometrium, as seen on prior ultrasound and consistent with recently diagnosed endometrial malignancy. 2. No evidence of lymphadenopathy or metastatic disease in the abdomen or pelvis. 3. Subcapsular  hyperenhancing lesion of the lateral inferior right lobe of the liver, hepatic segment VI is only very faintly appreciated on this portal venous phase contrast examination, better characterized as a benign focal nodular hyperplasia by prior MR. 4. Diverticulosis. Aortic Atherosclerosis (ICD10-I70.0). Electronically Signed   By: Delanna Ahmadi M.D.   On: 03/30/2021 11:34      IMPRESSION: FIGO Stage IA (pT1a, pN0, cM0) Endometrial Cancer, serous high-grade  She was found to have a early stage endometrial cancer but pathology is very aggressive with that being serous high-grade carcinoma.  We discussed that this type of tumor tends to recur in the vaginal area and vaginal brachytherapy is recommended to reduce the chances for recurrence in this area.  She will proceed with systemic chemotherapy to reduce chances of recurrence in other parts of the body.  Today, I talked to the patient and husband about the findings and work-up thus far.  We discussed the natural history of serous carcinoma of the endometrium and general treatment, highlighting the role of radiotherapy in the management.  We discussed the available radiation techniques, and focused on the details of logistics and delivery.  We reviewed the anticipated acute and late sequelae associated with radiation in this setting.  The patient was encouraged to ask questions that I answered to the best of my ability.  A patient consent form was discussed and signed.  We retained a copy for our records.  The patient would like to proceed with radiation and will be scheduled for CT simulation.  PLAN: Patient will proceed with adjuvant chemotherapy initially.  We plan to sequence her radiation therapy during her 2nd-4th cycles of chemotherapy.  The patient does have a trip planned to the [facility] April 8 and would like to hold off on any radiation therapy during this time.  Anticipate 5 high-dose-rate treatments directed at the proximal vagina.  Iridium 192  will be the high-dose-rate source.   60 minutes of total time was spent for this patient encounter, including preparation, face-to-face counseling with the patient and coordination of care, physical exam, and documentation of the encounter.   ------------------------------------------------  [name], PhD, MD  This document serves as a record of services personally performed by Gery Pray, MD. It was created on his behalf by Roney Mans, a trained medical scribe. The creation of this record is based on the scribe's personal observations and the provider's statements to them. This document has been checked and approved by the attending provider.

## 2021-04-25 NOTE — Assessment & Plan Note (Signed)
We reviewed the NCCN guidelines We discussed the role of chemotherapy. The intent is of curative intent.  We discussed some of the risks, benefits, side-effects of carboplatin & Taxol. Treatment is intravenous, every 3 weeks x 6 cycles  Some of the short term side-effects included, though not limited to, including weight loss, life threatening infections, risk of allergic reactions, need for transfusions of blood products, nausea, vomiting, change in bowel habits, loss of hair, admission to hospital for various reasons, and risks of death.   Long term side-effects are also discussed including risks of infertility, permanent damage to nerve function, hearing loss, chronic fatigue, kidney damage with possibility needing hemodialysis, and rare secondary malignancy including bone marrow disorders.  The patient is aware that the response rates discussed earlier is not guaranteed.  After a long discussion, patient made an informed decision to proceed with the prescribed plan of care.   Patient education material was dispensed. We discussed premedication with dexamethasone before chemotherapy. We discussed cranial prosthesis I recommend port placement due to poor venous access Previously, when she received similar chemotherapy 17 years ago, her treatment was complicated by neutropenic fever and the patient requested G-CSF support I told her it is not a common practice for me to request G-CSF support but given her prior history, I think it is reasonable I will try to get Onpro approval and if is not possible, we will get her G-CSF injection here We will get her tentatively started on the week of February 13 After completion of treatment, I plan to order CT imaging for review The patient would like to return back to Dr. Julien Nordmann as her primary oncologist afterwards which I think is reasonable

## 2021-04-25 NOTE — Assessment & Plan Note (Signed)
She had pre-existing peripheral neuropathy from prior treatment Recommend upfront dose adjustment with reduced dose paclitaxel and we will monitor for side effects carefully

## 2021-04-25 NOTE — Progress Notes (Signed)
Ms. Koury refused chemo education class. She has previous had chemotherapy in 2006. Per Dr. Alvy Bimler, she does not need to have chemotherapy class.

## 2021-04-25 NOTE — Progress Notes (Signed)
[place] FOLLOW-UP progress notes  Patient Care Team: Carmen Frees, MD as PCP - General (Family Medicine) Carmen Mink Craige Cotta, RN as Oncology Nurse Navigator (Oncology)  CHIEF COMPLAINTS/PURPOSE OF VISIT:  Uterine cancer, for adjuvant treatment  HISTORY OF PRESENTING ILLNESS:  [name] 71 y.o. female was transferred to my care after recent diagnosis of uterine cancer She follows with Dr. Julien Nordmann full history of lung cancer as well as breast cancer Following her surgical resection, the patient underwent adjuvant treatment.  I was not able to verify the chemotherapy regimen that she had received but the patient recall clearly that she had received carboplatin and paclitaxel many years ago Her treatment course was complicated by neutropenic fever requiring growth factor support and admission to the hospital She also have mild residual peripheral neuropathy from prior treatment The patient was prescribed tamoxifen after the newly diagnosed breast cancer/DCIS She was discovered to have postmenopausal bleeding leading to evaluation and subsequent diagnosis of uterine cancer Even though she had early stage disease, due to presence of high-grade serous carcinoma, adjuvant chemotherapy and radiation is recommended She is here accompanied by her husband She is recovering well from her recent surgery  I reviewed the patient's records extensive and collaborated the history with the patient. Summary of her history is as follows: Oncology History Overview Note  CA-125 on 04/06/21: 10.2 High grade serous, Her 2 neg Genetics from 04/30/18: VUS   History of lung cancer (Resolved)  04/28/2012 Initial Diagnosis   Lung cancer, diagnosed in March of 2006 stage II a status post left trisegmentectomy with lymph node dissection followed by 4 cycles of adjuvant chemotherapy   05/06/2018 Genetic Testing   MEN1 c.875C>T and MSH3 c.1366G>A VUS identified on the multicancer gene panel.  The  Multi-Gene Panel offered by [facility] includes sequencing and/or deletion duplication testing of the following 84 genes: AIP, ALK, APC, ATM, AXIN2,BAP1,  BARD1, BLM, BMPR1A, BRCA1, BRCA2, BRIP1, CASR, CDC73, CDH1, CDK4, CDKN1B, CDKN1C, CDKN2A (p14ARF), CDKN2A (p16INK4a), CEBPA, CHEK2, CTNNA1, DICER1, DIS3L2, EGFR (c.2369C>T, p.Thr790Met variant only), EPCAM (Deletion/duplication testing only), FH, FLCN, GATA2, GPC3, GREM1 (Promoter region deletion/duplication testing only), HOXB13 (c.251G>A, p.Gly84Glu), HRAS, KIT, MAX, MEN1, MET, MITF (c.952G>A, p.Glu318Lys variant only), MLH1, MSH2, MSH3, MSH6, MUTYH, NBN, NF1, NF2, NTHL1, PALB2, PDGFRA, PHOX2B, PMS2, POLD1, POLE, POT1, PRKAR1A, PTCH1, PTEN, RAD50, RAD51C, RAD51D, RB1, RECQL4, RET, RUNX1, SDHAF2, SDHA (sequence changes only), SDHB, SDHC, SDHD, SMAD4, SMARCA4, SMARCB1, SMARCE1, STK11, SUFU, TERC, TERT, TMEM127, TP53, TSC1, TSC2, VHL, WRN and WT1.  The report date is May 06, 2018.   History of ductal carcinoma in situ (DCIS) of breast  04/28/2012 Initial Diagnosis   Ductal carcinoma in situ of right breast diagnosed in December of 2013, status post right lumpectomy in January of 2014   05/06/2018 Genetic Testing   MEN1 c.875C>T and MSH3 c.1366G>A VUS identified on the multicancer gene panel.  The Multi-Gene Panel offered by [facility] includes sequencing and/or deletion duplication testing of the following 84 genes: AIP, ALK, APC, ATM, AXIN2,BAP1,  BARD1, BLM, BMPR1A, BRCA1, BRCA2, BRIP1, CASR, CDC73, CDH1, CDK4, CDKN1B, CDKN1C, CDKN2A (p14ARF), CDKN2A (p16INK4a), CEBPA, CHEK2, CTNNA1, DICER1, DIS3L2, EGFR (c.2369C>T, p.Thr790Met variant only), EPCAM (Deletion/duplication testing only), FH, FLCN, GATA2, GPC3, GREM1 (Promoter region deletion/duplication testing only), HOXB13 (c.251G>A, p.Gly84Glu), HRAS, KIT, MAX, MEN1, MET, MITF (c.952G>A, p.Glu318Lys variant only), MLH1, MSH2, MSH3, MSH6, MUTYH, NBN, NF1, NF2, NTHL1, PALB2, PDGFRA, PHOX2B, PMS2, POLD1, POLE,  POT1, PRKAR1A, PTCH1, PTEN, RAD50, RAD51C, RAD51D, RB1, RECQL4, RET, RUNX1, SDHAF2, SDHA (  sequence changes only), SDHB, SDHC, SDHD, SMAD4, SMARCA4, SMARCB1, SMARCE1, STK11, SUFU, TERC, TERT, TMEM127, TP53, TSC1, TSC2, VHL, WRN and WT1.  The report date is May 06, 2018.   Endometrial carcinoma ([place])  01/27/2021 Initial Diagnosis   Patient began having vaginal spotting on November 10th.  She had 4 days of spotting which she describes as seeing red when she wiped and having a little brown discharge on her liner.  Bleeding then stopped completely.  She saw her primary care provider and underwent an ultrasound.  Ultrasound showed thickened endometrium measuring 14 mm.  She then started bleeding again on December 14 and had a week of bleeding with a little bit more red blood this time.  She ultimately saw OB/GYN and had an endometrial biopsy.  This was first attempted however secondary to cervical stenosis, patient returned the following day after using intravaginal misoprostol.  Endometrial biopsy on 12/22 revealed FIGO grade 2/3 endometrial cancer.   02/27/2021 Imaging   US pelvis Thickened endometrium with possible 14 mm endometrial mass. In the setting of post-menopausal bleeding, endometrial sampling is indicated to exclude carcinoma   03/06/2021 Initial Biopsy   Endometrial biopsy revealed FIGO grade 2/3 endometrial cancer   03/24/2021 Initial Diagnosis   Endometrial carcinoma ([place])   03/30/2021 Imaging   CT A/P: 1. Abnormal thickening of the endometrium, as seen on prior ultrasound and consistent with recently diagnosed endometrial malignancy. 2. No evidence of lymphadenopathy or metastatic disease in the abdomen or pelvis. 3. Subcapsular hyperenhancing lesion of the lateral inferior right lobe of the liver, hepatic segment VI is only very faintly appreciated on this portal venous phase contrast examination, better characterized as a benign focal nodular hyperplasia by prior MR. 4.  Diverticulosis.   04/11/2021 Surgery   Robotic-assisted laparoscopic total hysterectomy with bilateral salpingoophorectomy, SLN biopsy, omental biopsy, cystoscopy   Findings: On EUA, small mobile uterus. On intra-abdominal entry, normal upper abdominal survey. Normal omentum, small bowel. Large bowels with some filmy adhesions to the left pelvic sidewall and the left adhesions. Filmy adhesions also noted from the sigmoid to the posterior uterus (some of the adhesions biopsied). Uterus 8cm and normal in appearance. Atrophic appearing adnexa. Mapping successful to bilateral SLNs. No obvious adenopathy. Minimal adhesions of the bladder to the LUS. No intra-abdominal or pelvic evidence of disease. Given concentrated appearing urine and low UOP, cystoscopy was performed. Bladder dome intact (bubbles seen). Good efflux noted from bilateral ureteral orifices.    04/11/2021 Pathologic Stage   Stage IA serous uterine cancer No LVI, negative SLNs Omentum negative, washings nondiagnostic (insufficient cellularity) HER2 negative   04/18/2021 Cancer Staging   Staging form: Corpus Uteri - Carcinoma and Carcinosarcoma, AJCC 8th Edition - Pathologic stage from 04/18/2021: FIGO Stage IA (pT1a, pN0, cM0) - Signed by Heath Lark, MD on 04/18/2021 Stage prefix: Initial diagnosis    05/09/2021 -  Chemotherapy   Patient is on Treatment Plan : UTERINE Carboplatin AUC 6 / Paclitaxel q21d       MEDICAL HISTORY:  Past Medical History:  Diagnosis Date   Anxiety    Breast cancer ([place])    2014 and 2015   Cancer ([place])    lung cancer    DDD (degenerative disc disease)    neck   Dry eye    Eczema    Family history of breast cancer    Family history of stomach cancer    GERD (gastroesophageal reflux disease)    occasional - no current med.   Goiter  History of breast cancer    History of lung cancer    non-small cell - left upper lobe   IBS (irritable bowel syndrome)    Non-allergic rhinitis    sinusitis    Osteoarthritis    Personal history of radiation therapy 2014   PONV (postoperative nausea and vomiting)    Radiation 07/03/12-07/09/12   Right upper lung 5400 cGy   Thyroid nodule, toxic or with hyperthyroidism    no current med.   Urinary, incontinence, stress female    Vasomotor rhinitis    Vertigo     SURGICAL HISTORY: Past Surgical History:  Procedure Laterality Date   ANTERIOR CERVICAL DECOMP/DISCECTOMY FUSION N/A 12/22/2019   Procedure: Cervical four-five Cervical five-six Cervical six-seven Anterior cervical decompression/discectomy/fusion;  Surgeon: Erline Levine, MD;  Location: [facility];  Service: Neurosurgery;  Laterality: N/A;   APPENDECTOMY  1971   BREAST LUMPECTOMY Right 2014   BREAST RECONSTRUCTION WITH PLACEMENT OF TISSUE EXPANDER AND FLEX HD (ACELLULAR HYDRATED DERMIS) Right 05/20/2013   Procedure: IMMEDIATE RIGHT BREAST RECONSTRUCTION WITH LATISSAMUS MUSCLE FLAP AND PLACEMENT OF TISSUE EXPANDER TO RIGHT BREAST;  Surgeon: Theodoro Kos, DO;  Location: [facility];  Service: Plastics;  Laterality: Right;   BREAST REDUCTION WITH MASTOPEXY Left 10/22/2013   Procedure: BREAST REDUCTION/MASTOPEXY TO LEFT BREAST WITH LIPOSUCTION/FAT GRAFTING FOR SYMMETRY;  Surgeon: Theodoro Kos, DO;  Location: [facility];  Service: Plastics;  Laterality: Left;   CYSTOSCOPY N/A 04/11/2021   Procedure: CYSTOSCOPY;  Surgeon: [name], MD;  Location: [facility];  Service: Gynecology;  Laterality: N/A;   DE QUERVAIN'S RELEASE Left 07/31/2002   FRACTURE SURGERY Left 2019   Torn meniscus; bone fragments removed   ganglion cyst R hand     1980s   KNEE SURGERY Left    torn meniscus   LUNG REMOVAL, PARTIAL     2014   MASTECTOMY Right    2015   MASTECTOMY W/ SENTINEL NODE BIOPSY Right 05/20/2013   Procedure: RIGHT SIMPLEMASTECTOMY AND SENTINEL NODE   Sampling;  Surgeon: Joyice Faster. Cornett, MD;  Location: [facility];  Service: General;  Laterality: Right;   MOUTH SURGERY Right may 2014    "keratocystic odotogenic tumor" - X 2   NASAL SINUS SURGERY  ?1988-1990   "at least 3" (05/21/2013)   PARTIAL MASTECTOMY WITH NEEDLE LOCALIZATION  04/10/2012   Procedure: PARTIAL MASTECTOMY WITH NEEDLE LOCALIZATION;  Surgeon: Marcello Moores A. Cornett, MD;  Location: [facility];  Service: General;  Laterality: Right;   REDUCTION MAMMAPLASTY Left 2015   REMOVAL OF TISSUE EXPANDER AND PLACEMENT OF IMPLANT Right 10/22/2013   Procedure: REMOVAL OF RIGHT TISSUE EXPANDER WITH PLACEMENT OF RIGHT BREAST IMPLANT;  Surgeon: Theodoro Kos, DO;  Location: [facility];  Service: Plastics;  Laterality: Right;   ROBOTIC ASSISTED BILATERAL SALPINGO OOPHERECTOMY Bilateral 04/11/2021   Procedure: XI ROBOTIC ASSISTED HYSTERECTOMY WITH BILATERAL SALPINGO OOPHORECTOMY; OMENTAL BIOPSY;  Surgeon: [name], MD;  Location: [facility];  Service: Gynecology;  Laterality: Bilateral;   SENTINEL NODE BIOPSY N/A 04/11/2021   Procedure: SENTINEL NODE BIOPSY;  Surgeon: [name], MD;  Location[facility];  Service: Gynecology;  Laterality: N/A;   SEPTOPLASTY     & sinus surgeries 1980s-1990s   THORACOTOMY Left 07/14/2004   TRIGGER FINGER RELEASE Bilateral    "I've had OR on q finger" (05/21/2013)   TRIGGER FINGER RELEASE Right 07/14/1999   index and little fingers   TRIGGER FINGER RELEASE Right 07/17/2002   long and  ring fingers   TRIGGER FINGER RELEASE Left 07/31/2002   little, ring and index fingers   VIDEO ASSISTED THORACOSCOPY (VATS)/ LOBECTOMY Left 07/14/2004   upper tri-segmentectomy   WISDOM TOOTH EXTRACTION     "3"    SOCIAL HISTORY: Social History   Socioeconomic History   Marital status: Married    Spouse name: Not on file   Number of children: 1   Years of education: Not on file   Highest education level: Not on file  Occupational History    Employer: [employer]  Tobacco Use   Smoking status: Former    Types: Cigarettes    Quit date: 05/06/2004    Years since  quitting: 16.9   Smokeless tobacco: Never  Vaping Use   Vaping Use: Never used  Substance and Sexual Activity   Alcohol use: Yes    Alcohol/week: 7.0 standard drinks    Types: 7 Glasses of wine per week    Comment: 1 glass wine daily   Drug use: Never   Sexual activity: Not Currently  Other Topics Concern   Not on file  Social History Narrative   Lives at home with spouse   Right handed   Caffeine: 2-3 cups of coffee/day   Social Determinants of Health   Financial Resource Strain: Not on file  Food Insecurity: Not on file  Transportation Needs: Not on file  Physical Activity: Not on file  Stress: Not on file  Social Connections: Not on file  Intimate Partner Violence: Not on file    FAMILY HISTORY: Family History  Problem Relation Age of Onset   Anesthesia problems Mother        post-op N/V   Thyroid nodules Mother    Other Mother        DDD, spinal stenosis   High blood pressure Mother    Hearing loss Mother    Leukemia Father        CML, d. 22s   High blood pressure Father    Hearing loss Father    Breast cancer Sister        BRCA testing in 2006 timeframe is neg   Thyroid nodules Sister    Thyroid nodules Sister    Hearing loss Brother    Breast cancer Maternal Aunt        dx >50   Thyroid nodules Maternal Aunt    Thyroid nodules Maternal Aunt    Hearing loss Paternal Aunt    Hearing loss Paternal Uncle    Stomach cancer Maternal Grandmother    Anesthesia problems Daughter        post-op N/V   Stomach cancer Maternal Great-grandmother        assumed stomach cancer   Prostate cancer Neg Hx    Pancreatic cancer Neg Hx    Endometrial cancer Neg Hx    Ovarian cancer Neg Hx    Colon cancer Neg Hx     ALLERGIES:  is allergic to morphine and related, amoxicillin, asa [aspirin], banana, ceftin [cefuroxime axetil], citrus, lactose, lactose intolerance (gi), orange oil, penicillins, cefuroxime, clavulanic acid, other, tetanus-diphth-acell pertussis, and  tape.  MEDICATIONS:  Current Outpatient Medications  Medication Sig Dispense Refill   dexamethasone (DECADRON) 4 MG tablet Take 2 tabs at the night before and 2 tab the morning of chemotherapy, every 3 weeks, by mouth x 6 cycles 36 tablet 6   acetaminophen (TYLENOL) 500 MG tablet Take 1,000 mg by mouth at bedtime as needed (Arthritis pain).  cyclobenzaprine (FLEXERIL) 10 MG tablet Take 5-10 mg by mouth at bedtime as needed for muscle spasms.  0   cycloSPORINE (RESTASIS) 0.05 % ophthalmic emulsion Place 1 drop into both eyes 2 (two) times daily.      esomeprazole (NEXIUM) 20 MG capsule Take 20 mg by mouth daily.     fluocinonide gel (LIDEX) 4.09 % Apply 1 application topically daily as needed (canker sores).      fluticasone (FLONASE) 50 MCG/ACT nasal spray Place 2 sprays into both nostrils daily.      hyoscyamine (LEVSIN) 0.125 MG tablet Take 0.125 mg by mouth 3 (three) times daily as needed (IBS).     Lactobacillus (ACIDOPHILUS PO) Take 5 mg by mouth daily.     lidocaine-prilocaine (EMLA) cream Apply to affected area once 30 g 3   MELOXICAM PO Take 15 mg by mouth daily as needed (Arthritis pain).     Multiple Vitamin (MULTIVITAMIN) capsule Take 1 capsule by mouth in the morning.      Olopatadine HCl 0.2 % SOLN Place 1 drop into both eyes daily as needed (Allergies/Seasonal).     ondansetron (ZOFRAN) 8 MG tablet Take 1 tablet (8 mg total) by mouth 2 (two) times daily as needed. Start on the third day after chemotherapy. 30 tablet 1   Peppermint Oil (IBGARD PO) Take 180 mg by mouth daily as needed (IBS).     Polyethyl Glycol-Propyl Glycol (SYSTANE) 0.4-0.3 % SOLN Place 1 drop into both eyes daily as needed (Dry eye).     prochlorperazine (COMPAZINE) 10 MG tablet Take 1 tablet (10 mg total) by mouth every 6 (six) hours as needed (Nausea or vomiting). 30 tablet 1   senna-docusate (SENOKOT-S) 8.6-50 MG tablet Take 2 tablets by mouth at bedtime. For AFTER surgery, do not take if having diarrhea  30 tablet 0   vitamin E 400 UNIT capsule Take 400 Units by mouth daily.      No current facility-administered medications for this visit.    REVIEW OF SYSTEMS:   Constitutional: Denies fevers, chills or abnormal night sweats Eyes: Denies blurriness of vision, double vision or watery eyes Ears, nose, mouth, throat, and face: Denies mucositis or sore throat Respiratory: Denies cough, dyspnea or wheezes Cardiovascular: Denies palpitation, chest discomfort or lower extremity swelling Gastrointestinal:  Denies nausea, heartburn or change in bowel habits Skin: Denies abnormal skin rashes Lymphatics: Denies new lymphadenopathy or easy bruising Behavioral/Psych: Mood is stable, no new changes  All other systems were reviewed with the patient and are negative.  PHYSICAL EXAMINATION: ECOG PERFORMANCE STATUS: 1 - Symptomatic but completely ambulatory  Vitals:   04/25/21 1232  BP: (!) 151/70  Pulse: 84  Resp: 18  Temp: 98.4 F (36.9 C)  SpO2: 100%   Filed Weights   04/25/21 1232  Weight: 156 lb 12.8 oz (71.1 kg)    GENERAL:alert, no distress and comfortable SKIN: skin color, texture, turgor are normal, no rashes or significant lesions EYES: normal, conjunctiva are pink and non-injected, sclera clear OROPHARYNX:no exudate, normal lips, buccal mucosa, and tongue  NECK: supple, thyroid normal size, non-tender, without nodularity LYMPH:  no palpable lymphadenopathy in the cervical, axillary or inguinal LUNGS: clear to auscultation and percussion with normal breathing effort HEART: regular rate & rhythm and no murmurs without lower extremity edema ABDOMEN:abdomen soft, non-tender and normal bowel sounds.  Noted well-healed surgical scar Musculoskeletal:no cyanosis of digits and no clubbing  PSYCH: alert & oriented x 3 with fluent speech NEURO: no focal motor/sensory deficits  LABORATORY DATA:  I have reviewed the data as listed Lab Results  Component Value Date   WBC 6.6 04/06/2021    HGB 13.0 04/06/2021   HCT 41.1 04/06/2021   MCV 94.5 04/06/2021   PLT 377 04/06/2021   Recent Labs    07/20/20 0740 04/06/21 1036  NA 143 137  K 4.8 4.8  CL 108 105  CO2 26 25  GLUCOSE 102* 103*  BUN 15 15  CREATININE 0.80 0.78  CALCIUM 9.0 9.2  GFRNONAA >60 >60  PROT 6.5 7.1  ALBUMIN 3.3* 3.6  AST 18 19  ALT 11 16  ALKPHOS 62 61  BILITOT 0.3 0.3    RADIOGRAPHIC STUDIES: I have personally reviewed the radiological images as listed and agreed with the findings in the report. CT Abdomen Pelvis W Contrast  Result Date: 03/30/2021 CLINICAL DATA:  New diagnosis endometrial cancer staging, additional history of right breast cancer and lung cancer EXAM: CT ABDOMEN AND PELVIS WITH CONTRAST TECHNIQUE: Multidetector CT imaging of the abdomen and pelvis was performed using the standard protocol following bolus administration of intravenous contrast. CONTRAST:  63m OMNIPAQUE IOHEXOL 350 MG/ML SOLN, additional oral enteric contrast COMPARISON:  Pelvic ultrasound, 02/27/2021, MR abdomen, 11/30/2020 FINDINGS: Lower chest: No acute abnormality. Status post right mastectomy with implant reconstruction, partially imaged. Hepatobiliary: Subcapsular hyperenhancing lesion of the lateral inferior right lobe of the liver, hepatic segment VI is only very faintly appreciated on this portal venous phase contrast examination (series 2, image 19). No gallstones, gallbladder wall thickening, or biliary dilatation. Pancreas: Unremarkable. No pancreatic ductal dilatation or surrounding inflammatory changes. Spleen: Normal in size without significant abnormality. Adrenals/Urinary Tract: Adrenal glands are unremarkable. Kidneys are normal, without renal calculi, solid lesion, or hydronephrosis. Bladder is unremarkable. Stomach/Bowel: Stomach is within normal limits. Appendix is not clearly visualized and may be surgically absent. No evidence of bowel wall thickening, distention, or inflammatory changes. Descending  and sigmoid diverticulosis. Vascular/Lymphatic: Aortic atherosclerosis. No enlarged abdominal or pelvic lymph nodes. Reproductive: Thickening of the endometrium, as seen on prior ultrasound (series 6, image 59) Other: No abdominal wall hernia or abnormality. No abdominopelvic ascites. Musculoskeletal: No acute or significant osseous findings. IMPRESSION: 1. Abnormal thickening of the endometrium, as seen on prior ultrasound and consistent with recently diagnosed endometrial malignancy. 2. No evidence of lymphadenopathy or metastatic disease in the abdomen or pelvis. 3. Subcapsular hyperenhancing lesion of the lateral inferior right lobe of the liver, hepatic segment VI is only very faintly appreciated on this portal venous phase contrast examination, better characterized as a benign focal nodular hyperplasia by prior MR. 4. Diverticulosis. Aortic Atherosclerosis (ICD10-I70.0). Electronically Signed   By: ADelanna AhmadiM.D.   On: 03/30/2021 11:34    ASSESSMENT & PLAN:  Endometrial carcinoma ([place] We reviewed the NCCN guidelines We discussed the role of chemotherapy. The intent is of curative intent.  We discussed some of the risks, benefits, side-effects of carboplatin & Taxol. Treatment is intravenous, every 3 weeks x 6 cycles  Some of the short term side-effects included, though not limited to, including weight loss, life threatening infections, risk of allergic reactions, need for transfusions of blood products, nausea, vomiting, change in bowel habits, loss of hair, admission to hospital for various reasons, and risks of death.   Long term side-effects are also discussed including risks of infertility, permanent damage to nerve function, hearing loss, chronic fatigue, kidney damage with possibility needing hemodialysis, and rare secondary malignancy including bone marrow disorders.  The patient is aware that the  response rates discussed earlier is not guaranteed.  After a long discussion, patient  made an informed decision to proceed with the prescribed plan of care.   Patient education material was dispensed. We discussed premedication with dexamethasone before chemotherapy. We discussed cranial prosthesis I recommend port placement due to poor venous access Previously, when she received similar chemotherapy 17 years ago, her treatment was complicated by neutropenic fever and the patient requested G-CSF support I told her it is not a common practice for me to request G-CSF support but given her prior history, I think it is reasonable I will try to get Onpro approval and if is not possible, we will get her G-CSF injection here We will get her tentatively started on the week of February 13 After completion of treatment, I plan to order CT imaging for review The patient would like to return back to Dr. Julien Nordmann as her primary oncologist afterwards which I think is reasonable  Peripheral neuropathy due to chemotherapy [facility]) She had pre-existing peripheral neuropathy from prior treatment Recommend upfront dose adjustment with reduced dose paclitaxel and we will monitor for side effects carefully  No orders of the defined types were placed in this encounter.   All questions were answered. The patient knows to call the clinic with any problems, questions or concerns. The total time spent in the appointment was 80 minutes encounter with patients including review of chart and various tests results, discussions about plan of care and coordination of care plan   Heath Lark, MD 04/25/2021 3:03 PM

## 2021-04-26 ENCOUNTER — Ambulatory Visit
Admission: RE | Admit: 2021-04-26 | Discharge: 2021-04-26 | Disposition: A | Discharge status: Home / Self Care | Payer: Medicare Other | Source: Ambulatory Visit | Attending: Radiation Oncology | Admitting: Radiation Oncology

## 2021-04-26 ENCOUNTER — Encounter: Payer: Self-pay | Admitting: Radiation Oncology

## 2021-04-26 VITALS — BP 158/69 | HR 82 | Temp 96.6°F | Resp 18 | Ht 63.0 in

## 2021-04-26 DIAGNOSIS — I700 Atherosclerosis of aorta: Secondary | ICD-10-CM | POA: Insufficient documentation

## 2021-04-26 DIAGNOSIS — M5135 Other intervertebral disc degeneration, thoracolumbar region: Secondary | ICD-10-CM | POA: Insufficient documentation

## 2021-04-26 DIAGNOSIS — Z85118 Personal history of other malignant neoplasm of bronchus and lung: Secondary | ICD-10-CM | POA: Diagnosis not present

## 2021-04-26 DIAGNOSIS — K589 Irritable bowel syndrome without diarrhea: Secondary | ICD-10-CM | POA: Diagnosis not present

## 2021-04-26 DIAGNOSIS — Z9049 Acquired absence of other specified parts of digestive tract: Secondary | ICD-10-CM | POA: Insufficient documentation

## 2021-04-26 DIAGNOSIS — F419 Anxiety disorder, unspecified: Secondary | ICD-10-CM | POA: Diagnosis not present

## 2021-04-26 DIAGNOSIS — Z800 Family history of malignant neoplasm of digestive organs: Secondary | ICD-10-CM | POA: Diagnosis not present

## 2021-04-26 DIAGNOSIS — R110 Nausea: Secondary | ICD-10-CM | POA: Diagnosis not present

## 2021-04-26 DIAGNOSIS — Z923 Personal history of irradiation: Secondary | ICD-10-CM | POA: Diagnosis not present

## 2021-04-26 DIAGNOSIS — M1990 Unspecified osteoarthritis, unspecified site: Secondary | ICD-10-CM | POA: Diagnosis not present

## 2021-04-26 DIAGNOSIS — Z803 Family history of malignant neoplasm of breast: Secondary | ICD-10-CM | POA: Diagnosis not present

## 2021-04-26 DIAGNOSIS — C541 Malignant neoplasm of endometrium: Secondary | ICD-10-CM | POA: Insufficient documentation

## 2021-04-26 DIAGNOSIS — K219 Gastro-esophageal reflux disease without esophagitis: Secondary | ICD-10-CM | POA: Diagnosis not present

## 2021-04-26 DIAGNOSIS — Z9011 Acquired absence of right breast and nipple: Secondary | ICD-10-CM | POA: Insufficient documentation

## 2021-04-26 NOTE — Progress Notes (Signed)
See MD note for nursing evaluation. °

## 2021-04-27 ENCOUNTER — Telehealth: Payer: Self-pay | Admitting: Oncology

## 2021-04-27 ENCOUNTER — Other Ambulatory Visit: Payer: Self-pay | Admitting: Hematology and Oncology

## 2021-04-27 NOTE — Telephone Encounter (Signed)
Called [name] and let her know that she will need to come back for GCSF injections per her insurance.  She verbalized understanding and said she has injection appointment scheduled.

## 2021-05-01 ENCOUNTER — Telehealth: Payer: Self-pay | Admitting: Oncology

## 2021-05-01 NOTE — Telephone Encounter (Signed)
[name] called to clarify her PRN nausea medications. Advised her that she can take the compazine as needed q 6 hours.  Discussed that she will receive antinausea medication in her premeds for chemo that works in the same way as Zofran, therefore she should not take it until 3 days after chemo.  She verbalized understanding and agreement.

## 2021-05-02 ENCOUNTER — Other Ambulatory Visit: Payer: Self-pay | Admitting: Internal Medicine

## 2021-05-02 NOTE — Progress Notes (Addendum)
Pharmacist Chemotherapy Monitoring - Initial Assessment    Anticipated start date: 05/09/21   The following has been reviewed per standard work regarding the patient's treatment regimen: The patient's diagnosis, treatment plan and drug doses, and organ/hematologic function Lab orders and baseline tests specific to treatment regimen  The treatment plan start date, drug sequencing, and pre-medications Prior authorization status  Patient's documented medication list, including drug-drug interaction screen and prescriptions for anti-emetics and supportive care specific to the treatment regimen The drug concentrations, fluid compatibility, administration routes, and timing of the medications to be used The patient's access for treatment and lifetime cumulative dose history, if applicable  The patient's medication allergies and previous infusion related reactions, if applicable   Changes made to treatment plan:  N/A  Follow up needed:  1) Pending authorization for treatment  2) Adding G-CSF and return appointments 3) F/U labs on 05/09/21   Acquanetta Belling, [facility], 05/02/2021  3:20 PM

## 2021-05-04 ENCOUNTER — Other Ambulatory Visit: Payer: Self-pay | Admitting: Hematology and Oncology

## 2021-05-04 ENCOUNTER — Encounter: Payer: Self-pay | Admitting: Hematology and Oncology

## 2021-05-04 ENCOUNTER — Encounter (HOSPITAL_COMMUNITY): Payer: Self-pay

## 2021-05-04 ENCOUNTER — Ambulatory Visit (HOSPITAL_COMMUNITY)
Admission: RE | Admit: 2021-05-04 | Discharge: 2021-05-04 | Disposition: A | Discharge status: Home / Self Care | Payer: Medicare Other | Source: Ambulatory Visit | Attending: Hematology and Oncology | Admitting: Hematology and Oncology

## 2021-05-04 ENCOUNTER — Other Ambulatory Visit: Payer: Self-pay

## 2021-05-04 DIAGNOSIS — C55 Malignant neoplasm of uterus, part unspecified: Secondary | ICD-10-CM | POA: Insufficient documentation

## 2021-05-04 DIAGNOSIS — Z853 Personal history of malignant neoplasm of breast: Secondary | ICD-10-CM | POA: Insufficient documentation

## 2021-05-04 DIAGNOSIS — C541 Malignant neoplasm of endometrium: Secondary | ICD-10-CM

## 2021-05-04 DIAGNOSIS — Z85118 Personal history of other malignant neoplasm of bronchus and lung: Secondary | ICD-10-CM | POA: Insufficient documentation

## 2021-05-04 HISTORY — PX: IR US GUIDE VASC ACCESS RIGHT: IMG2390

## 2021-05-04 HISTORY — PX: IR IMAGING GUIDED PORT INSERTION: IMG5740

## 2021-05-04 IMAGING — XA IR IMAGING GUIDED PORT INSERTION
1 series · 2 of 2 positions shown · non-contrast
Comparison: none

INDICATION: Newly diagnosed uterine cancer.

[Series 1: ir fluoro/shunt/fist · 2 of 2 slices shown]
[im 1/2]
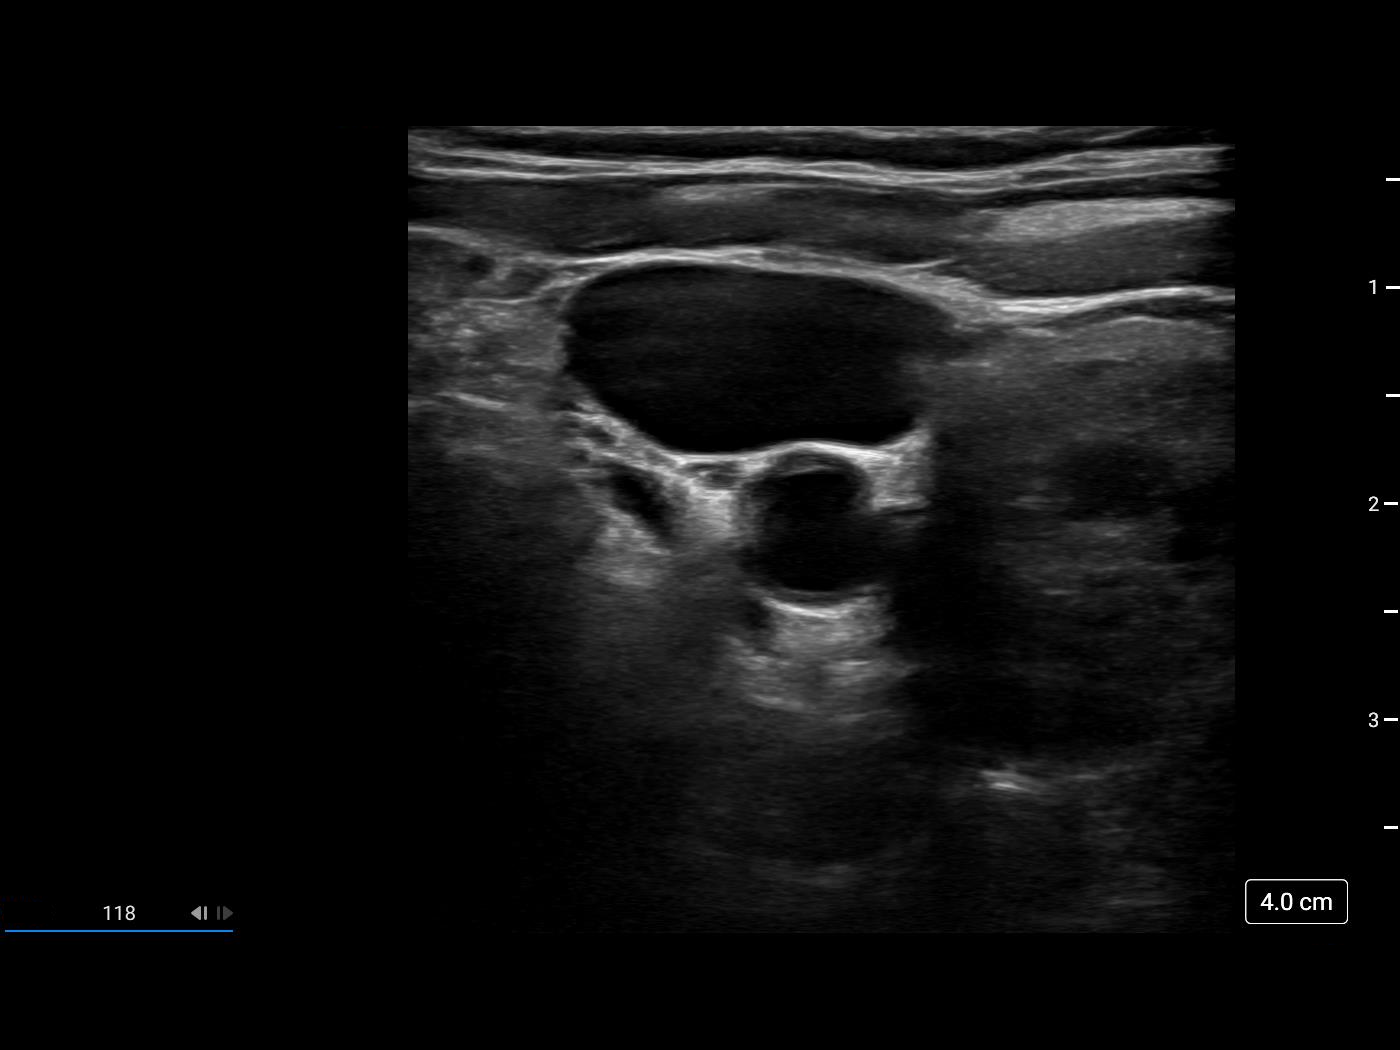
[im 2/2]
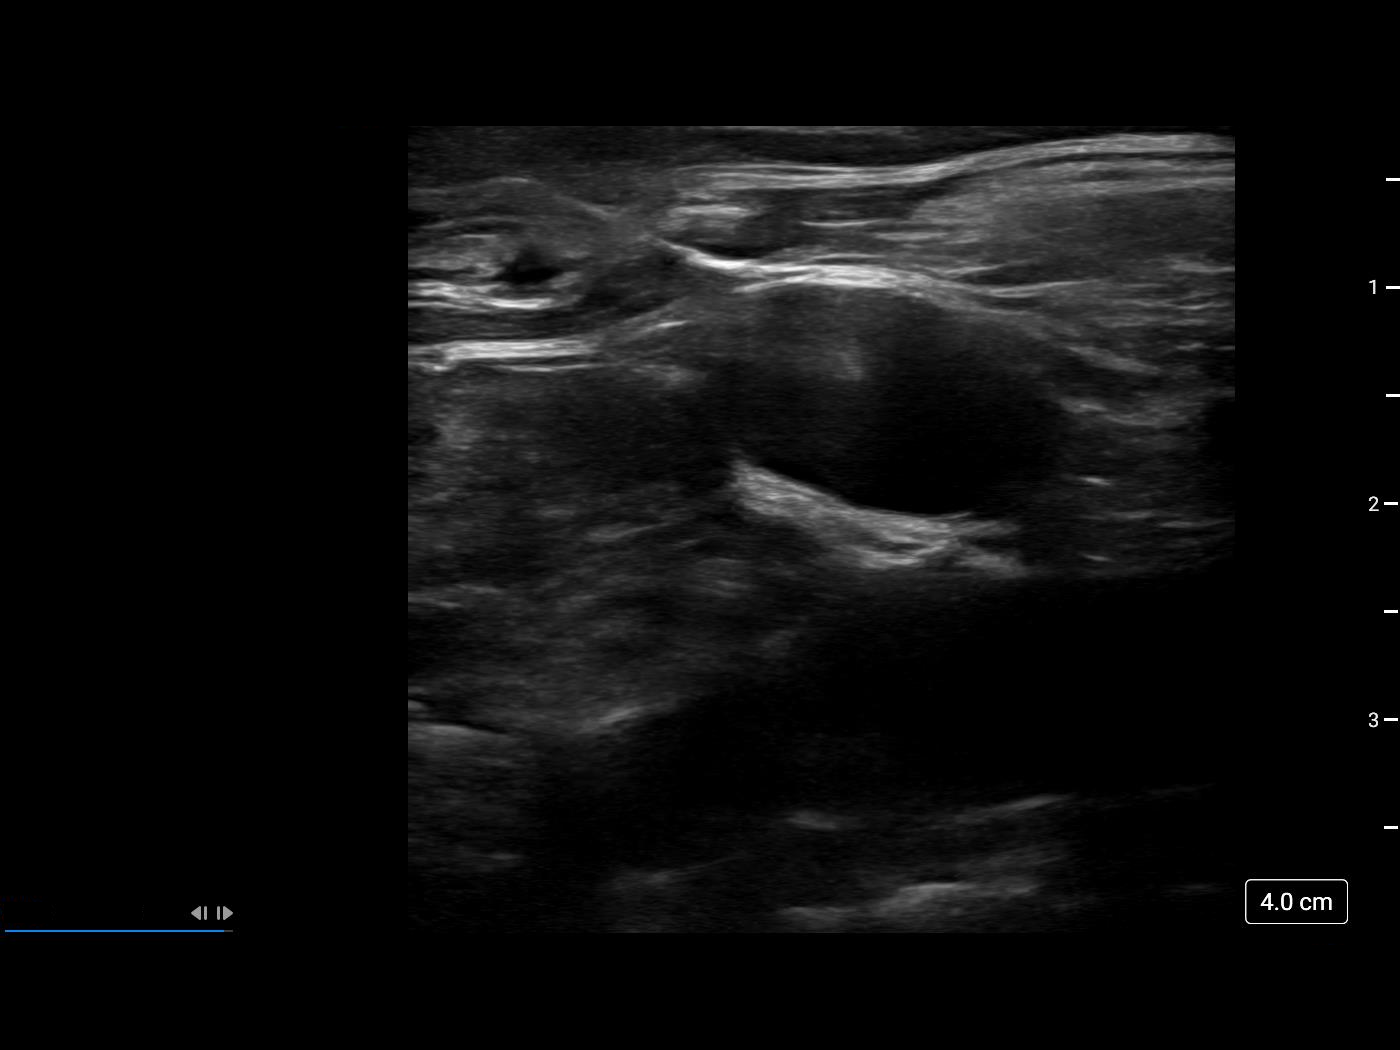

[2 of 2 positions shown; findings below may reference images not displayed]

EXAM:
IMPLANTED PORT A CATH PLACEMENT WITH ULTRASOUND AND FLUOROSCOPIC
GUIDANCE

MEDICATIONS:
None

ANESTHESIA/SEDATION:
Moderate (conscious) sedation was employed during this procedure. A
total of Versed 2 mg and Fentanyl 1 mcg was administered
intravenously.

Moderate Sedation Time: 20 minutes. The patient's level of
consciousness and vital signs were monitored continuously by
radiology nursing throughout the procedure under my direct
supervision.

FLUOROSCOPY TIME:  0 minutes, 18 seconds (1 mGy)

COMPLICATIONS:
None immediate.

PROCEDURE:
The procedure, risks, benefits, and alternatives were explained to
the patient. Questions regarding the procedure were encouraged and
answered. The patient understands and consents to the procedure.

The RIGHT neck and chest were prepped with chlorhexidine in a
sterile fashion, and a sterile drape was applied covering the
operative field. Maximum barrier sterile technique with sterile
gowns and gloves were used for the procedure. A timeout was
performed prior to the initiation of the procedure. Local anesthesia
was provided with 1% lidocaine with epinephrine.

After creating a small venotomy incision, a micropuncture kit was
utilized to access the internal jugular vein under direct, real-time
ultrasound guidance. Ultrasound image documentation was performed.
The microwire was kinked to measure appropriate catheter length.

A subcutaneous port pocket was then created along the upper chest
wall utilizing a combination of sharp and blunt dissection. The
pocket was irrigated with sterile saline. A single lumen ISP power
injectable port was chosen for placement. The 8 Fr catheter was
tunneled from the port pocket site to the venotomy incision. The
port was placed in the pocket. The external catheter was trimmed to
appropriate length. At the venotomy, an 8 Fr peel-away sheath was
placed over a guidewire under fluoroscopic guidance. The catheter
was then placed through the sheath and the sheath was removed. Final
catheter positioning was confirmed and documented with a
fluoroscopic spot radiograph. The port was accessed with RAIMY
needle, aspirated and flushed with heparinized saline.

The port pocket incision was closed with interrupted 3-0 Vicryl
suture then Dermabond was applied, including at the venotomy
incision. Dressings were placed. The patient tolerated the procedure
well without immediate post procedural complication.
IMPRESSION: Successful placement of a RIGHT internal jugular approach power
injectable Port-A-Cath.

The tip of the catheter is positioned within the proximal RIGHT
atrium. The catheter is ready for immediate use.

## 2021-05-04 MED ORDER — FENTANYL CITRATE (PF) 100 MCG/2ML IJ SOLN
INTRAMUSCULAR | Status: AC | PRN
  Administered 2021-05-04: 50 ug via INTRAVENOUS

## 2021-05-04 MED ORDER — FENTANYL CITRATE (PF) 100 MCG/2ML IJ SOLN
INTRAMUSCULAR | Status: AC
  Filled 2021-05-04: qty 2

## 2021-05-04 MED ORDER — HEPARIN SOD (PORK) LOCK FLUSH 100 UNIT/ML IV SOLN
INTRAVENOUS | Status: AC
  Filled 2021-05-04: qty 5

## 2021-05-04 MED ORDER — MIDAZOLAM HCL 2 MG/2ML IJ SOLN
INTRAMUSCULAR | Status: AC | PRN
Start: 2021-05-04 — End: 2021-05-04
  Administered 2021-05-04: 1 mg via INTRAVENOUS

## 2021-05-04 MED ORDER — HEPARIN SOD (PORK) LOCK FLUSH 100 UNIT/ML IV SOLN
INTRAVENOUS | Status: AC | PRN
  Administered 2021-05-04: 500 [IU] via INTRAVENOUS

## 2021-05-04 MED ORDER — MIDAZOLAM HCL 2 MG/2ML IJ SOLN
INTRAMUSCULAR | Status: AC | PRN
  Administered 2021-05-04: 1 mg via INTRAVENOUS

## 2021-05-04 MED ORDER — MIDAZOLAM HCL 2 MG/2ML IJ SOLN
INTRAMUSCULAR | Status: AC
  Filled 2021-05-04: qty 2

## 2021-05-04 MED ORDER — LIDOCAINE-EPINEPHRINE 1 %-1:100000 IJ SOLN
INTRAMUSCULAR | Status: AC | PRN
  Administered 2021-05-04: 10 mL via INTRADERMAL

## 2021-05-04 MED ORDER — SODIUM CHLORIDE 0.9 % IV SOLN: INTRAVENOUS | Status: DC

## 2021-05-04 MED ORDER — LIDOCAINE HCL (PF) 1 % IJ SOLN: INTRAMUSCULAR | Status: AC | PRN

## 2021-05-04 MED ORDER — LIDOCAINE-EPINEPHRINE 1 %-1:100000 IJ SOLN
INTRAMUSCULAR | Status: AC
  Filled 2021-05-04: qty 1

## 2021-05-04 NOTE — Consult Note (Signed)
Chief Complaint: Patient was seen in consultation today for Port-A-Cath placement  Referring Physician(s): [place]  Supervising Physician: Michaelle Birks  Patient Status: [facility] - Out-pt  History of Present Illness: [name] is a 71 y.o. female with past medical history significant for lung cancer, breast cancer and recently diagnosed uterine cancer.  Also with history of degenerative disc disease, eczema, GERD, goiter, IBS, osteoarthritis.  She has poor venous access and presents today for Port-A-Cath placement to assist with treatment.  Past Medical History:  Diagnosis Date   Anxiety    Breast cancer ([place])    2014 and 2015   Cancer ([place])    lung cancer    DDD (degenerative disc disease)    neck   Dry eye    Eczema    Family history of breast cancer    Family history of stomach cancer    GERD (gastroesophageal reflux disease)    occasional - no current med.   Goiter    History of breast cancer    History of lung cancer    non-small cell - left upper lobe   IBS (irritable bowel syndrome)    Non-allergic rhinitis    sinusitis   Osteoarthritis    Personal history of radiation therapy 2014   PONV (postoperative nausea and vomiting)    Radiation 07/03/12-07/09/12   Right upper lung 5400 cGy   Thyroid nodule, toxic or with hyperthyroidism    no current med.   Urinary, incontinence, stress female    Vasomotor rhinitis    Vertigo     Past Surgical History:  Procedure Laterality Date   ANTERIOR CERVICAL DECOMP/DISCECTOMY FUSION N/A 12/22/2019   Procedure: Cervical four-five Cervical five-six Cervical six-seven Anterior cervical decompression/discectomy/fusion;  Surgeon: Erline Levine, MD;  Location: [place];  Service: Neurosurgery;  Laterality: N/A;   APPENDECTOMY  1971   BREAST LUMPECTOMY Right 2014   BREAST RECONSTRUCTION WITH PLACEMENT OF TISSUE EXPANDER AND FLEX HD (ACELLULAR HYDRATED DERMIS) Right 05/20/2013   Procedure: IMMEDIATE RIGHT BREAST RECONSTRUCTION  WITH LATISSAMUS MUSCLE FLAP AND PLACEMENT OF TISSUE EXPANDER TO RIGHT BREAST;  Surgeon: Theodoro Kos, DO;  Location: [place];  Service: Plastics;  Laterality: Right;   BREAST REDUCTION WITH MASTOPEXY Left 10/22/2013   Procedure: BREAST REDUCTION/MASTOPEXY TO LEFT BREAST WITH LIPOSUCTION/FAT GRAFTING FOR SYMMETRY;  Surgeon: Theodoro Kos, DO;  Location: [place];  Service: Plastics;  Laterality: Left;   CYSTOSCOPY N/A 04/11/2021   Procedure: CYSTOSCOPY;  Surgeon: [name], MD;  Location: [facility];  Service: Gynecology;  Laterality: N/A;   DE QUERVAIN'S RELEASE Left 07/31/2002   FRACTURE SURGERY Left 2019   Torn meniscus; bone fragments removed   ganglion cyst R hand     1980s   KNEE SURGERY Left    torn meniscus   LUNG REMOVAL, PARTIAL     2014   MASTECTOMY Right    2015   MASTECTOMY W/ SENTINEL NODE BIOPSY Right 05/20/2013   Procedure: RIGHT SIMPLEMASTECTOMY AND SENTINEL NODE   Sampling;  Surgeon: Joyice Faster. Cornett, MD;  Location: [place];  Service: General;  Laterality: Right;   MOUTH SURGERY Right may 2014   "keratocystic odotogenic tumor" - X 2   NASAL SINUS SURGERY  ?1988-1990   "at least 3" (05/21/2013)   PARTIAL MASTECTOMY WITH NEEDLE LOCALIZATION  04/10/2012   Procedure: PARTIAL MASTECTOMY WITH NEEDLE LOCALIZATION;  Surgeon: Marcello Moores A. Cornett, MD;  Location: [place];  Service: General;  Laterality: Right;   REDUCTION MAMMAPLASTY  Left 2015   REMOVAL OF TISSUE EXPANDER AND PLACEMENT OF IMPLANT Right 10/22/2013   Procedure: REMOVAL OF RIGHT TISSUE EXPANDER WITH PLACEMENT OF RIGHT BREAST IMPLANT;  Surgeon: Theodoro Kos, DO;  Location: [place];  Service: Plastics;  Laterality: Right;   ROBOTIC ASSISTED BILATERAL SALPINGO OOPHERECTOMY Bilateral 04/11/2021   Procedure: XI ROBOTIC ASSISTED HYSTERECTOMY WITH BILATERAL SALPINGO OOPHORECTOMY; OMENTAL BIOPSY;  Surgeon: [name], MD;  Location[facility];  Service: Gynecology;   Laterality: Bilateral;   SENTINEL NODE BIOPSY N/A 04/11/2021   Procedure: SENTINEL NODE BIOPSY;  Surgeon: [name], MD;  [facility];  Service: Gynecology;  Laterality: N/A;   SEPTOPLASTY     & sinus surgeries 1980s-1990s   THORACOTOMY Left 07/14/2004   TRIGGER FINGER RELEASE Bilateral    "I've had OR on q finger" (05/21/2013)   TRIGGER FINGER RELEASE Right 07/14/1999   index and little fingers   TRIGGER FINGER RELEASE Right 07/17/2002   long and ring fingers   TRIGGER FINGER RELEASE Left 07/31/2002   little, ring and index fingers   VIDEO ASSISTED THORACOSCOPY (VATS)/ LOBECTOMY Left 07/14/2004   upper tri-segmentectomy   WISDOM TOOTH EXTRACTION     "3"    Allergies: Augmentin [amoxicillin-pot clavulanate], Morphine and related, Amoxicillin, Asa [aspirin], Banana, Ceftin [cefuroxime axetil], Citrus, Lactose, Lactose intolerance (gi), Orange oil, Penicillins, Cefuroxime, Clavulanic acid, Other, Tetanus-diphth-acell pertussis, and Tape  Medications: Prior to Admission medications   Medication Sig Start Date End Date Taking? Authorizing Provider  acetaminophen (TYLENOL) 500 MG tablet Take 1,000 mg by mouth at bedtime as needed (Arthritis pain).   Yes [provider]  cycloSPORINE (RESTASIS) 0.05 % ophthalmic emulsion Place 1 drop into both eyes 2 (two) times daily.    Yes [provider]  esomeprazole (NEXIUM) 20 MG capsule Take 20 mg by mouth daily.   Yes [provider]  Lactobacillus (ACIDOPHILUS PO) Take 5 mg by mouth daily.   Yes [provider]  MELOXICAM PO Take 15 mg by mouth daily as needed (Arthritis pain).   Yes [provider]  Multiple Vitamin (MULTIVITAMIN) capsule Take 1 capsule by mouth in the morning.    Yes [provider]  Peppermint Oil (IBGARD PO) Take 180 mg by mouth daily as needed (IBS). 01/16/21  Yes [provider]  vitamin E 400 UNIT capsule Take 400 Units by mouth daily.    Yes [provider]  cyclobenzaprine (FLEXERIL) 10 MG tablet Take 5-10 mg by mouth at bedtime as needed for muscle spasms. 03/28/17   [provider]  dexamethasone (DECADRON) 4 MG tablet Take 2 tabs at the night before and 2 tab the morning of chemotherapy, every 3 weeks, by mouth x 6 cycles Patient not taking: Reported on 04/26/2021 04/25/21   Heath Lark, MD  fluocinonide gel (LIDEX) 9.56 % Apply 1 application topically daily as needed (canker sores).  01/06/14   [provider]  fluticasone (FLONASE) 50 MCG/ACT nasal spray Place 2 sprays into both nostrils daily.     [provider]  hyoscyamine (LEVSIN) 0.125 MG tablet Take 0.125 mg by mouth 3 (three) times daily as needed (IBS). 01/30/21   [provider]  lidocaine-prilocaine (EMLA) cream Apply to affected area once Patient not taking: Reported on 04/26/2021 04/25/21   Heath Lark, MD  Olopatadine HCl 0.2 % SOLN Place 1 drop into both eyes daily as needed (Allergies/Seasonal).    [provider]  ondansetron (ZOFRAN) 8 MG tablet Take 1  tablet (8 mg total) by mouth 2 (two) times daily as needed. Start on the third day after chemotherapy. Patient not taking: Reported on 04/26/2021 04/25/21   Heath Lark, MD  Polyethyl Glycol-Propyl Glycol (SYSTANE) 0.4-0.3 % SOLN Place 1 drop into both eyes daily as needed (Dry eye).    [provider]  prochlorperazine (COMPAZINE) 10 MG tablet Take 1 tablet (10 mg total) by mouth every 6 (six) hours as needed (Nausea or vomiting). Patient not taking: Reported on 04/26/2021 04/25/21   Heath Lark, MD  senna-docusate (SENOKOT-S) 8.6-50 MG tablet Take 2 tablets by mouth at bedtime. For AFTER surgery, do not take if having diarrhea Patient not taking: Reported on 04/26/2021 03/24/21   Joylene John D, NP     Family History  Problem Relation Age of Onset   Anesthesia problems Mother        post-op N/V   Thyroid nodules Mother    Other Mother        DDD, spinal stenosis    High blood pressure Mother    Hearing loss Mother    Leukemia Father        CML, d. 70s   High blood pressure Father    Hearing loss Father    Breast cancer Sister        BRCA testing in 2006 timeframe is neg   Thyroid nodules Sister    Thyroid nodules Sister    Hearing loss Brother    Breast cancer Maternal Aunt        dx >50   Thyroid nodules Maternal Aunt    Thyroid nodules Maternal Aunt    Hearing loss Paternal Aunt    Hearing loss Paternal Uncle    Stomach cancer Maternal Grandmother    Anesthesia problems Daughter        post-op N/V   Stomach cancer Maternal Great-grandmother        assumed stomach cancer   Prostate cancer Neg Hx    Pancreatic cancer Neg Hx    Endometrial cancer Neg Hx    Ovarian cancer Neg Hx    Colon cancer Neg Hx     Social History   Socioeconomic History   Marital status: Married    Spouse name: Not on file   Number of children: 1   Years of education: Not on file   Highest education level: Not on file  Occupational History    Employer: [employer]  Tobacco Use   Smoking status: Former    Types: Cigarettes    Quit date: 05/06/2004    Years since quitting: 17.0   Smokeless tobacco: Never  Vaping Use   Vaping Use: Never used  Substance and Sexual Activity   Alcohol use: Yes    Alcohol/week: 7.0 standard drinks    Types: 7 Glasses of wine per week    Comment: 1 glass wine daily   Drug use: Never   Sexual activity: Not Currently  Other Topics Concern   Not on file  Social History Narrative   Lives at home with spouse   Right handed   Caffeine: 2-3 cups of coffee/day   Social Determinants of Health   Financial Resource Strain: Not on file  Food Insecurity: Not on file  Transportation Needs: Not on file  Physical Activity: Not on file  Stress: Not on file  Social Connections: Not on file      Review of Systems denies fever,HA,CP,dyspnea, cough, abd/back pain,N/V or bleeding.  Vital Signs: Vitals:  05/04/21  1313  BP: (!) 147/66  Pulse: 81  Resp: 17  Temp: 98.3 F (36.8 C)  SpO2: 100%    Ht _0  (1.6 m)    Wt 156 lb 12.8 oz (71.1 kg)    BMI 27.78 kg/m   Physical Exam awake/alert; chest- CTA bilat; heart- RRR; abd- soft,+BS,NT; no LE edema  Imaging: No results found.  Labs:  CBC: Recent Labs    07/20/20 0740 04/06/21 1036  WBC 5.7 6.6  HGB 12.2 13.0  HCT 38.6 41.1  PLT 266 377    COAGS: No results for input(s): INR, APTT in the last 8760 hours.  BMP: Recent Labs    07/20/20 0740 04/06/21 1036  NA 143 137  K 4.8 4.8  CL 108 105  CO2 26 25  GLUCOSE 102* 103*  BUN 15 15  CALCIUM 9.0 9.2  CREATININE 0.80 0.78  GFRNONAA >60 >60    LIVER FUNCTION TESTS: Recent Labs    07/20/20 0740 04/06/21 1036  BILITOT 0.3 0.3  AST 18 19  ALT 11 16  ALKPHOS 62 61  PROT 6.5 7.1  ALBUMIN 3.3* 3.6    TUMOR MARKERS: No results for input(s): AFPTM, CEA, CA199, CHROMGRNA in the last 8760 hours.  Assessment and Plan: 71 y.o. female with past medical history significant for lung cancer, breast cancer and recently diagnosed uterine cancer.  Also with history of degenerative disc disease, eczema, GERD, goiter, IBS, osteoarthritis.  She has poor venous access and presents today for Port-A-Cath placement to assist with treatment.Risks and benefits of image guided port-a-catheter placement was discussed with the patient including, but not limited to bleeding, infection, pneumothorax, or fibrin sheath development and need for additional procedures.  All of the patient's questions were answered, patient is agreeable to proceed. Consent signed and in chart.    Thank you for this interesting consult.  I greatly enjoyed meeting [name] and look forward to participating in their care.  A copy of this report was sent to the requesting provider on this date.  Electronically Signed: D. Rowe Robert, PA-C 05/04/2021, 1:15 PM   I spent a total of  25 minutes   in face to face in  clinical consultation, greater than 50% of which was counseling/coordinating care for Port-A-Cath placement

## 2021-05-04 NOTE — Progress Notes (Signed)
Called pt to introduce myself as her Arboriculturist.  Unfortunately there aren't any foundations offering copay assistance for her Dx and the type of ins she has.  I offered the [employer] and went over what it covers but pt declined it at this time.

## 2021-05-04 NOTE — Discharge Instructions (Signed)
Please call Interventional Radiology clinic [phone] with any questions or concerns.  You may remove your dressing and shower tomorrow.  DO NOT use EMLA cream on your port site for 2 weeks as this cream will remove surgical glue on your incision.  Implanted Port Insertion, Care After This sheet gives you information about how to care for yourself after your procedure. Your health care provider may also give you more specific instructions. If you have problems or questions, contact your health care provider. What can I expect after the procedure? After the procedure, it is common to have: Discomfort at the port insertion site. Bruising on the skin over the port. This should improve over 3-4 days. Follow these instructions at home: [facility] care After your port is placed, you will get a manufacturer's information card. The card has information about your port. Keep this card with you at all times. Take care of the port as told by your health care provider. Ask your health care provider if you or a family member can get training for taking care of the port at home. A home health care nurse may also take care of the port. Make sure to remember what type of port you have. Incision care Follow instructions from your health care provider about how to take care of your port insertion site. Make sure you: Wash your hands with soap and water before and after you change your bandage (dressing). If soap and water are not available, use hand sanitizer. Change your dressing as told by your health care provider. Leave stitches (sutures), skin glue, or adhesive strips in place. These skin closures may need to stay in place for 2 weeks or longer. If adhesive strip edges start to loosen and curl up, you may trim the loose edges. Do not remove adhesive strips completely unless your health care provider tells you to do that. Check your port insertion site every day for signs of infection. Check for: Redness,  swelling, or pain. Fluid or blood. Warmth. Pus or a bad smell.        Activity Return to your normal activities as told by your health care provider. Ask your health care provider what activities are safe for you. Do not lift anything that is heavier than 10 lb (4.5 kg), or the limit that you are told, until your health care provider says that it is safe. General instructions Take over-the-counter and prescription medicines only as told by your health care provider. Do not take baths, swim, or use a hot tub until your health care provider approves. Ask your health care provider if you may take showers. You may only be allowed to take sponge baths. Do not drive for 24 hours if you were given a sedative during your procedure. Wear a medical alert bracelet in case of an emergency. This will tell any health care providers that you have a port. Keep all follow-up visits as told by your health care provider. This is important. Contact a health care provider if: You cannot flush your port with saline as directed, or you cannot draw blood from the port. You have a fever or chills. You have redness, swelling, or pain around your port insertion site. You have fluid or blood coming from your port insertion site. Your port insertion site feels warm to the touch. You have pus or a bad smell coming from the port insertion site. Get help right away if: You have chest pain or shortness of breath. You have bleeding from  your port that you cannot control. Summary Take care of the port as told by your health care provider. Keep the manufacturer's information card with you at all times. Change your dressing as told by your health care provider. Contact a health care provider if you have a fever or chills or if you have redness, swelling, or pain around your port insertion site. Keep all follow-up visits as told by your health care provider. This information is not intended to replace advice given to you  by your health care provider. Make sure you discuss any questions you have with your health care provider. Document Revised: 10/08/2017 Document Reviewed: 10/08/2017 Elsevier Patient Education  2021 [place].   Moderate Conscious Sedation, Adult, Care After This sheet gives you information about how to care for yourself after your procedure. Your health care provider may also give you more specific instructions. If you have problems or questions, contact your health care provider. What can I expect after the procedure? After the procedure, it is common to have: Sleepiness for several hours. Impaired judgment for several hours. Difficulty with balance. Vomiting if you eat too soon. Follow these instructions at home: For the time period you were told by your health care provider: Rest. Do not participate in activities where you could fall or become injured. Do not drive or use machinery. Do not drink alcohol. Do not take sleeping pills or medicines that cause drowsiness. Do not make important decisions or sign legal documents. Do not take care of children on your own.        Eating and drinking Follow the diet recommended by your health care provider. Drink enough fluid to keep your urine pale yellow. If you vomit: Drink water, juice, or soup when you can drink without vomiting. Make sure you have little or no nausea before eating solid foods.    General instructions Take over-the-counter and prescription medicines only as told by your health care provider. Have a responsible adult stay with you for the time you are told. It is important to have someone help care for you until you are awake and alert. Do not smoke. Keep all follow-up visits as told by your health care provider. This is important. Contact a health care provider if: You are still sleepy or having trouble with balance after 24 hours. You feel light-headed. You keep feeling nauseous or you keep vomiting. You  develop a rash. You have a fever. You have redness or swelling around the IV site. Get help right away if: You have trouble breathing. You have new-onset confusion at home. Summary After the procedure, it is common to feel sleepy, have impaired judgment, or feel nauseous if you eat too soon. Rest after you get home. Know the things you should not do after the procedure. Follow the diet recommended by your health care provider and drink enough fluid to keep your urine pale yellow. Get help right away if you have trouble breathing or new-onset confusion at home. This information is not intended to replace advice given to you by your health care provider. Make sure you discuss any questions you have with your health care provider. Document Revised: 07/10/2019 Document Reviewed: 02/05/2019 Elsevier Patient Education  2021 [employer].

## 2021-05-04 NOTE — Procedures (Signed)
Vascular and Interventional Radiology Procedure Note  Patient: [name] DOB: [birth year] Medical Record Number: [id number] Note Date/Time: 05/04/21 4:17 PM   Performing Physician: Michaelle Birks, MD Assistant(s): None  Diagnosis: Uterine cancer  Procedure: PORT PLACEMENT  Anesthesia: Conscious Sedation Complications: None Estimated Blood Loss: Minimal  Findings:  Successful right-sided SL port placement, with the tip of the catheter in the proximal right atrium.  Plan: Catheter ready for use.  See detailed procedure note with images in PACS. The patient tolerated the procedure well without incident or complication and was returned to Recovery in stable condition.    Michaelle Birks, MD [facility] Radiology   Pager. [place]

## 2021-05-05 ENCOUNTER — Inpatient Hospital Stay: Payer: Medicare Other | Attending: Gynecologic Oncology | Admitting: Gynecologic Oncology

## 2021-05-05 ENCOUNTER — Inpatient Hospital Stay: Payer: Medicare Other

## 2021-05-05 ENCOUNTER — Encounter: Payer: Self-pay | Admitting: Gynecologic Oncology

## 2021-05-05 VITALS — BP 146/92 | HR 90 | Temp 98.3°F | Resp 18 | Ht 62.99 in | Wt 157.4 lb

## 2021-05-05 DIAGNOSIS — M1990 Unspecified osteoarthritis, unspecified site: Secondary | ICD-10-CM | POA: Diagnosis not present

## 2021-05-05 DIAGNOSIS — M5030 Other cervical disc degeneration, unspecified cervical region: Secondary | ICD-10-CM | POA: Insufficient documentation

## 2021-05-05 DIAGNOSIS — Z5111 Encounter for antineoplastic chemotherapy: Secondary | ICD-10-CM | POA: Insufficient documentation

## 2021-05-05 DIAGNOSIS — Z87891 Personal history of nicotine dependence: Secondary | ICD-10-CM | POA: Insufficient documentation

## 2021-05-05 DIAGNOSIS — Z90710 Acquired absence of both cervix and uterus: Secondary | ICD-10-CM | POA: Insufficient documentation

## 2021-05-05 DIAGNOSIS — C541 Malignant neoplasm of endometrium: Secondary | ICD-10-CM | POA: Insufficient documentation

## 2021-05-05 DIAGNOSIS — Z800 Family history of malignant neoplasm of digestive organs: Secondary | ICD-10-CM | POA: Diagnosis not present

## 2021-05-05 DIAGNOSIS — Z803 Family history of malignant neoplasm of breast: Secondary | ICD-10-CM | POA: Insufficient documentation

## 2021-05-05 DIAGNOSIS — Z79899 Other long term (current) drug therapy: Secondary | ICD-10-CM | POA: Diagnosis not present

## 2021-05-05 DIAGNOSIS — K219 Gastro-esophageal reflux disease without esophagitis: Secondary | ICD-10-CM | POA: Insufficient documentation

## 2021-05-05 DIAGNOSIS — Z801 Family history of malignant neoplasm of trachea, bronchus and lung: Secondary | ICD-10-CM | POA: Insufficient documentation

## 2021-05-05 DIAGNOSIS — Z90722 Acquired absence of ovaries, bilateral: Secondary | ICD-10-CM | POA: Diagnosis not present

## 2021-05-05 DIAGNOSIS — Z7189 Other specified counseling: Secondary | ICD-10-CM

## 2021-05-05 LAB — CMP (CANCER CENTER ONLY)
ALT: 14 U/L (ref 0–44)
AST: 17 U/L (ref 15–41)
Albumin: 3.8 g/dL (ref 3.5–5.0)
Alkaline Phosphatase: 78 U/L (ref 38–126)
Anion gap: 7 (ref 5–15)
BUN: 12 mg/dL (ref 8–23)
CO2: 25 mmol/L (ref 22–32)
Calcium: 9 mg/dL (ref 8.9–10.3)
Chloride: 109 mmol/L (ref 98–111)
Creatinine: 0.71 mg/dL (ref 0.44–1.00)
GFR, Estimated: >60 mL/min (ref >60)
Glucose, Bld: 101 mg/dL — ABNORMAL HIGH (ref 70–99)
Potassium: 4.3 mmol/L (ref 3.5–5.1)
Sodium: 141 mmol/L (ref 135–145)
Total Bilirubin: 0.4 mg/dL (ref 0.3–1.2)
Total Protein: 6.8 g/dL (ref 6.5–8.1)

## 2021-05-05 LAB — CBC WITH DIFFERENTIAL (CANCER CENTER ONLY)
Abs Immature Granulocytes: 0.02 10*3/uL (ref 0.00–0.07)
Basophils Absolute: 0 10*3/uL (ref 0.0–0.1)
Basophils Relative: 1 %
Eosinophils Absolute: 0.3 10*3/uL (ref 0.0–0.5)
Eosinophils Relative: 4 %
HCT: 38.4 % (ref 36.0–46.0)
Hemoglobin: 12.6 g/dL (ref 12.0–15.0)
Immature Granulocytes: 0 %
Lymphocytes Relative: 26 %
Lymphs Abs: 1.7 10*3/uL (ref 0.7–4.0)
MCH: 30 pg (ref 26.0–34.0)
MCHC: 32.8 g/dL (ref 30.0–36.0)
MCV: 91.4 fL (ref 80.0–100.0)
Monocytes Absolute: 0.6 10*3/uL (ref 0.1–1.0)
Monocytes Relative: 8 %
Neutro Abs: 4 10*3/uL (ref 1.7–7.7)
Neutrophils Relative %: 61 %
Platelet Count: 286 10*3/uL (ref 150–400)
RBC: 4.2 MIL/uL (ref 3.87–5.11)
RDW: 13.9 % (ref 11.5–15.5)
WBC Count: 6.6 10*3/uL (ref 4.0–10.5)
nRBC: 0 % (ref 0.0–0.2)

## 2021-05-05 NOTE — Patient Instructions (Signed)
It was great to see you!  You are healing very well from surgery.  Remember no heavy lifting until 6 weeks after surgery and nothing in the vagina for at least 8 weeks.  It would be normal to have a little bit of spotting over the next few weeks as your stitches dissolved in the vagina.  All the best with treatment.  I will see you for follow-up once you have finished all of your chemotherapy and radiation.

## 2021-05-05 NOTE — Progress Notes (Signed)
Gynecologic Oncology Return Clinic Visit  05/05/2021  Reason for Visit: Follow-up after surgery, treatment discussion  Treatment History: Oncology History Overview Note  CA-125 on 04/06/21: 10.2 High grade serous, Her 2 neg Genetics from 04/30/18: VUS   MMR IHC intact   History of lung cancer (Resolved)  04/28/2012 Initial Diagnosis   Lung cancer, diagnosed in March of 2006 stage II a status post left trisegmentectomy with lymph node dissection followed by 4 cycles of adjuvant chemotherapy   05/06/2018 Genetic Testing   MEN1 c.875C>T and MSH3 c.1366G>A VUS identified on the multicancer gene panel.  The Multi-Gene Panel offered by [facility] includes sequencing and/or deletion duplication testing of the following 84 genes: AIP, ALK, APC, ATM, AXIN2,BAP1,  BARD1, BLM, BMPR1A, BRCA1, BRCA2, BRIP1, CASR, CDC73, CDH1, CDK4, CDKN1B, CDKN1C, CDKN2A (p14ARF), CDKN2A (p16INK4a), CEBPA, CHEK2, CTNNA1, DICER1, DIS3L2, EGFR (c.2369C>T, p.Thr790Met variant only), EPCAM (Deletion/duplication testing only), FH, FLCN, GATA2, GPC3, GREM1 (Promoter region deletion/duplication testing only), HOXB13 (c.251G>A, p.Gly84Glu), HRAS, KIT, MAX, MEN1, MET, MITF (c.952G>A, p.Glu318Lys variant only), MLH1, MSH2, MSH3, MSH6, MUTYH, NBN, NF1, NF2, NTHL1, PALB2, PDGFRA, PHOX2B, PMS2, POLD1, POLE, POT1, PRKAR1A, PTCH1, PTEN, RAD50, RAD51C, RAD51D, RB1, RECQL4, RET, RUNX1, SDHAF2, SDHA (sequence changes only), SDHB, SDHC, SDHD, SMAD4, SMARCA4, SMARCB1, SMARCE1, STK11, SUFU, TERC, TERT, TMEM127, TP53, TSC1, TSC2, VHL, WRN and WT1.  The report date is May 06, 2018.   History of ductal carcinoma in situ (DCIS) of breast  04/28/2012 Initial Diagnosis   Ductal carcinoma in situ of right breast diagnosed in December of 2013, status post right lumpectomy in January of 2014   05/06/2018 Genetic Testing   MEN1 c.875C>T and MSH3 c.1366G>A VUS identified on the multicancer gene panel.  The Multi-Gene Panel offered by [facility] includes  sequencing and/or deletion duplication testing of the following 84 genes: AIP, ALK, APC, ATM, AXIN2,BAP1,  BARD1, BLM, BMPR1A, BRCA1, BRCA2, BRIP1, CASR, CDC73, CDH1, CDK4, CDKN1B, CDKN1C, CDKN2A (p14ARF), CDKN2A (p16INK4a), CEBPA, CHEK2, CTNNA1, DICER1, DIS3L2, EGFR (c.2369C>T, p.Thr790Met variant only), EPCAM (Deletion/duplication testing only), FH, FLCN, GATA2, GPC3, GREM1 (Promoter region deletion/duplication testing only), HOXB13 (c.251G>A, p.Gly84Glu), HRAS, KIT, MAX, MEN1, MET, MITF (c.952G>A, p.Glu318Lys variant only), MLH1, MSH2, MSH3, MSH6, MUTYH, NBN, NF1, NF2, NTHL1, PALB2, PDGFRA, PHOX2B, PMS2, POLD1, POLE, POT1, PRKAR1A, PTCH1, PTEN, RAD50, RAD51C, RAD51D, RB1, RECQL4, RET, RUNX1, SDHAF2, SDHA (sequence changes only), SDHB, SDHC, SDHD, SMAD4, SMARCA4, SMARCB1, SMARCE1, STK11, SUFU, TERC, TERT, TMEM127, TP53, TSC1, TSC2, VHL, WRN and WT1.  The report date is May 06, 2018.   Endometrial carcinoma ([place])  01/27/2021 Initial Diagnosis   Patient began having vaginal spotting on November 10th.  She had 4 days of spotting which she describes as seeing red when she wiped and having a little brown discharge on her liner.  Bleeding then stopped completely.  She saw her primary care provider and underwent an ultrasound.  Ultrasound showed thickened endometrium measuring 14 mm.  She then started bleeding again on December 14 and had a week of bleeding with a little bit more red blood this time.  She ultimately saw OB/GYN and had an endometrial biopsy.  This was first attempted however secondary to cervical stenosis, patient returned the following day after using intravaginal misoprostol.  Endometrial biopsy on 12/22 revealed FIGO grade 2/3 endometrial cancer.   02/27/2021 Imaging   US pelvis Thickened endometrium with possible 14 mm endometrial mass. In the setting of post-menopausal bleeding, endometrial sampling is indicated to exclude carcinoma   03/06/2021 Initial Biopsy   Endometrial biopsy  revealed FIGO  grade 2/3 endometrial cancer   03/24/2021 Initial Diagnosis   Endometrial carcinoma ([place])   03/30/2021 Imaging   CT A/P: 1. Abnormal thickening of the endometrium, as seen on prior ultrasound and consistent with recently diagnosed endometrial malignancy. 2. No evidence of lymphadenopathy or metastatic disease in the abdomen or pelvis. 3. Subcapsular hyperenhancing lesion of the lateral inferior right lobe of the liver, hepatic segment VI is only very faintly appreciated on this portal venous phase contrast examination, better characterized as a benign focal nodular hyperplasia by prior MR. 4. Diverticulosis.   04/11/2021 Surgery   Robotic-assisted laparoscopic total hysterectomy with bilateral salpingoophorectomy, SLN biopsy, omental biopsy, cystoscopy   Findings: On EUA, small mobile uterus. On intra-abdominal entry, normal upper abdominal survey. Normal omentum, small bowel. Large bowels with some filmy adhesions to the left pelvic sidewall and the left adhesions. Filmy adhesions also noted from the sigmoid to the posterior uterus (some of the adhesions biopsied). Uterus 8cm and normal in appearance. Atrophic appearing adnexa. Mapping successful to bilateral SLNs. No obvious adenopathy. Minimal adhesions of the bladder to the LUS. No intra-abdominal or pelvic evidence of disease. Given concentrated appearing urine and low UOP, cystoscopy was performed. Bladder dome intact (bubbles seen). Good efflux noted from bilateral ureteral orifices.    04/11/2021 Pathologic Stage   Stage IA serous uterine cancer No LVI, negative SLNs Omentum negative, washings nondiagnostic (insufficient cellularity) HER2 negative   04/18/2021 Cancer Staging   Staging form: Corpus Uteri - Carcinoma and Carcinosarcoma, AJCC 8th Edition - Pathologic stage from 04/18/2021: FIGO Stage IA (pT1a, pN0, cM0) - Signed by Heath Lark, MD on 04/18/2021 Stage prefix: Initial diagnosis    05/05/2021 Procedure    Successful placement of a RIGHT internal jugular approach power injectable Port-A-Cath.   The tip of the catheter is positioned within the proximal RIGHT atrium. The catheter is ready for immediate use   05/09/2021 -  Chemotherapy   Patient is on Treatment Plan : UTERINE Carboplatin AUC 6 / Paclitaxel q21d       Interval History: Patient reports doing very well.  Denies any pain, sometimes has awareness related to surgery.  Reports normal bowel and bladder function, bowel function improved from prior to surgery.  Has had some minimal vaginal discharge since surgery, denies any heavy bleeding.  Reports good appetite without nausea or emesis.  Earlier this week, for several days, she would have random welts develop on her arms, legs, and spot on her back.  She used Benadryl with good relief.  Denies any rash for the last 2 days.  Had her port placed yesterday.  She starts chemotherapy on Tuesday.  Past Medical/Surgical History: Past Medical History:  Diagnosis Date   Anxiety    Breast cancer ([place])    2014 and 2015   Cancer ([place])    lung cancer    DDD (degenerative disc disease)    neck   Dry eye    Eczema    Family history of breast cancer    Family history of stomach cancer    GERD (gastroesophageal reflux disease)    occasional - no current med.   Goiter    History of breast cancer    History of lung cancer    non-small cell - left upper lobe   IBS (irritable bowel syndrome)    Non-allergic rhinitis    sinusitis   Osteoarthritis    Personal history of radiation therapy 2014   PONV (postoperative nausea and vomiting)    Radiation 07/03/12-07/09/12  Right upper lung 5400 cGy   Thyroid nodule, toxic or with hyperthyroidism    no current med.   Urinary, incontinence, stress female    Vasomotor rhinitis    Vertigo     Past Surgical History:  Procedure Laterality Date   ANTERIOR CERVICAL DECOMP/DISCECTOMY FUSION N/A 12/22/2019   Procedure: Cervical four-five Cervical  five-six Cervical six-seven Anterior cervical decompression/discectomy/fusion;  Surgeon: Erline Levine, MD;  Location: [facility];  Service: Neurosurgery;  Laterality: N/A;   APPENDECTOMY  1971   BREAST LUMPECTOMY Right 2014   BREAST RECONSTRUCTION WITH PLACEMENT OF TISSUE EXPANDER AND FLEX HD (ACELLULAR HYDRATED DERMIS) Right 05/20/2013   Procedure: IMMEDIATE RIGHT BREAST RECONSTRUCTION WITH LATISSAMUS MUSCLE FLAP AND PLACEMENT OF TISSUE EXPANDER TO RIGHT BREAST;  Surgeon: Theodoro Kos, DO;  Location: [facility];  Service: Plastics;  Laterality: Right;   BREAST REDUCTION WITH MASTOPEXY Left 10/22/2013   Procedure: BREAST REDUCTION/MASTOPEXY TO LEFT BREAST WITH LIPOSUCTION/FAT GRAFTING FOR SYMMETRY;  Surgeon: Theodoro Kos, DO;  Location: [facility];  Service: Plastics;  Laterality: Left;   CYSTOSCOPY N/A 04/11/2021   Procedure: CYSTOSCOPY;  Surgeon: [name], MD;  Location: [facility];  Service: Gynecology;  Laterality: N/A;   DE QUERVAIN'S RELEASE Left 07/31/2002   FRACTURE SURGERY Left 2019   Torn meniscus; bone fragments removed   ganglion cyst R hand     1980s   IR IMAGING GUIDED PORT INSERTION  05/04/2021   IR US GUIDE VASC ACCESS RIGHT  05/04/2021   KNEE SURGERY Left    torn meniscus   LUNG REMOVAL, PARTIAL     2014   MASTECTOMY Right    2015   MASTECTOMY W/ SENTINEL NODE BIOPSY Right 05/20/2013   Procedure: RIGHT SIMPLEMASTECTOMY AND SENTINEL NODE   Sampling;  Surgeon: Marcello Moores A. Cornett, MD;  Location: [facility];  Service: General;  Laterality: Right;   MOUTH SURGERY Right may 2014   "keratocystic odotogenic tumor" - X 2   NASAL SINUS SURGERY  ?1988-1990   "at least 3" (05/21/2013)   PARTIAL MASTECTOMY WITH NEEDLE LOCALIZATION  04/10/2012   Procedure: PARTIAL MASTECTOMY WITH NEEDLE LOCALIZATION;  Surgeon: Marcello Moores A. Cornett, MD;  Location: [facility];  Service: General;  Laterality: Right;   REDUCTION MAMMAPLASTY Left 2015   REMOVAL OF TISSUE EXPANDER AND PLACEMENT  OF IMPLANT Right 10/22/2013   Procedure: REMOVAL OF RIGHT TISSUE EXPANDER WITH PLACEMENT OF RIGHT BREAST IMPLANT;  Surgeon: Theodoro Kos, DO;  Location: [facility];  Service: Plastics;  Laterality: Right;   ROBOTIC ASSISTED BILATERAL SALPINGO OOPHERECTOMY Bilateral 04/11/2021   Procedure: XI ROBOTIC ASSISTED HYSTERECTOMY WITH BILATERAL SALPINGO OOPHORECTOMY; OMENTAL BIOPSY;  Surgeon: [name], MD;  Location[facility];  Service: Gynecology;  Laterality: Bilateral;   SENTINEL NODE BIOPSY N/A 04/11/2021   Procedure: SENTINEL NODE BIOPSY;  Surgeon: [name], MD;  Location[facility];  Service: Gynecology;  Laterality: N/A;   SEPTOPLASTY     & sinus surgeries 1980s-1990s   THORACOTOMY Left 07/14/2004   TRIGGER FINGER RELEASE Bilateral    "I've had OR on q finger" (05/21/2013)   TRIGGER FINGER RELEASE Right 07/14/1999   index and little fingers   TRIGGER FINGER RELEASE Right 07/17/2002   long and ring fingers   TRIGGER FINGER RELEASE Left 07/31/2002   little, ring and index fingers   VIDEO ASSISTED THORACOSCOPY (VATS)/ LOBECTOMY Left 07/14/2004   upper tri-segmentectomy   WISDOM TOOTH EXTRACTION     "3"    Family History  Problem Relation Age of Onset   Anesthesia problems Mother        post-op N/V   Thyroid nodules Mother    Other Mother        DDD, spinal stenosis   High blood pressure Mother    Hearing loss Mother    Leukemia Father        CML, d. 5s   High blood pressure Father    Hearing loss Father    Breast cancer Sister        BRCA testing in 2006 timeframe is neg   Thyroid nodules Sister    Thyroid nodules Sister    Hearing loss Brother    Breast cancer Maternal Aunt        dx >50   Thyroid nodules Maternal Aunt    Thyroid nodules Maternal Aunt    Hearing loss Paternal Aunt    Hearing loss Paternal Uncle    Stomach cancer Maternal Grandmother    Anesthesia problems Daughter        post-op N/V   Stomach cancer Maternal Great-grandmother         assumed stomach cancer   Prostate cancer Neg Hx    Pancreatic cancer Neg Hx    Endometrial cancer Neg Hx    Ovarian cancer Neg Hx    Colon cancer Neg Hx     Social History   Socioeconomic History   Marital status: Married    Spouse name: Not on file   Number of children: 1   Years of education: Not on file   Highest education level: Not on file  Occupational History    Employer: [employer]  Tobacco Use   Smoking status: Former    Types: Cigarettes    Quit date: 05/06/2004    Years since quitting: 17.0   Smokeless tobacco: Never  Vaping Use   Vaping Use: Never used  Substance and Sexual Activity   Alcohol use: Yes    Alcohol/week: 7.0 standard drinks    Types: 7 Glasses of wine per week    Comment: 1 glass wine daily   Drug use: Never   Sexual activity: Not Currently  Other Topics Concern   Not on file  Social History Narrative   Lives at home with spouse   Right handed   Caffeine: 2-3 cups of coffee/day   Social Determinants of Health   Financial Resource Strain: Not on file  Food Insecurity: Not on file  Transportation Needs: Not on file  Physical Activity: Not on file  Stress: Not on file  Social Connections: Not on file    Current Medications:  Current Outpatient Medications:    acetaminophen (TYLENOL) 500 MG tablet, Take 1,000 mg by mouth at bedtime as needed (Arthritis pain)., Disp: , Rfl:    cyclobenzaprine (FLEXERIL) 10 MG tablet, Take 5-10 mg by mouth at bedtime as needed for muscle spasms., Disp: , Rfl: 0   cycloSPORINE (RESTASIS) 0.05 % ophthalmic emulsion, Place 1 drop into both eyes 2 (two) times daily. , Disp: , Rfl:    dexamethasone (DECADRON) 4 MG tablet, Take 2 tabs at the night before and 2 tab the morning of chemotherapy, every 3 weeks, by mouth x 6 cycles, Disp: 36 tablet, Rfl: 6   esomeprazole (NEXIUM) 20 MG capsule, Take 20 mg by mouth daily., Disp: , Rfl:    fluocinonide gel (LIDEX) 7.20 %, Apply 1 application topically  daily as needed (canker sores). , Disp: , Rfl:    fluticasone (  FLONASE) 50 MCG/ACT nasal spray, Place 2 sprays into both nostrils daily. , Disp: , Rfl:    hyoscyamine (LEVSIN) 0.125 MG tablet, Take 0.125 mg by mouth 3 (three) times daily as needed (IBS)., Disp: , Rfl:    Lactobacillus (ACIDOPHILUS PO), Take 5 mg by mouth daily., Disp: , Rfl:    lidocaine-prilocaine (EMLA) cream, Apply to affected area once, Disp: 30 g, Rfl: 3   MELOXICAM PO, Take 15 mg by mouth daily as needed (Arthritis pain)., Disp: , Rfl:    Multiple Vitamin (MULTIVITAMIN) capsule, Take 1 capsule by mouth in the morning. , Disp: , Rfl:    Olopatadine HCl 0.2 % SOLN, Place 1 drop into both eyes daily as needed (Allergies/Seasonal)., Disp: , Rfl:    ondansetron (ZOFRAN) 8 MG tablet, Take 1 tablet (8 mg total) by mouth 2 (two) times daily as needed. Start on the third day after chemotherapy., Disp: 30 tablet, Rfl: 1   Peppermint Oil (IBGARD PO), Take 180 mg by mouth daily as needed (IBS)., Disp: , Rfl:    Polyethyl Glycol-Propyl Glycol (SYSTANE) 0.4-0.3 % SOLN, Place 1 drop into both eyes daily as needed (Dry eye)., Disp: , Rfl:    prochlorperazine (COMPAZINE) 10 MG tablet, Take 1 tablet (10 mg total) by mouth every 6 (six) hours as needed (Nausea or vomiting)., Disp: 30 tablet, Rfl: 1   senna-docusate (SENOKOT-S) 8.6-50 MG tablet, Take 2 tablets by mouth at bedtime. For AFTER surgery, do not take if having diarrhea, Disp: 30 tablet, Rfl: 0   vitamin E 400 UNIT capsule, Take 400 Units by mouth daily. , Disp: , Rfl:   Review of Systems: Pertinent positives as per HPI. Denies appetite changes, fevers, chills, fatigue, unexplained weight changes. Denies hearing loss, neck lumps or masses, mouth sores, ringing in ears or voice changes. Denies cough or wheezing.  Denies shortness of breath. Denies chest pain or palpitations. Denies leg swelling. Denies abdominal distention, pain, blood in stools, constipation, diarrhea, nausea,  vomiting, or early satiety. Denies pain with intercourse, dysuria, frequency, hematuria or incontinence. Denies hot flashes, pelvic pain, vaginal bleeding or vaginal discharge.   Denies joint pain, back pain or muscle pain/cramps. Denies wounds. Denies dizziness, headaches, numbness or seizures. Denies swollen lymph nodes or glands, denies easy bruising or bleeding. Denies anxiety, depression, confusion, or decreased concentration.  Physical Exam: BP (!) 146/92 (BP Location: Left Arm, Patient Position: Sitting)    Pulse 90    Temp 98.3 F (36.8 C) (Oral)    Resp 18    Ht 5' 2.99" (1.6 m)    Wt 157 lb 6.4 oz (71.4 kg)    SpO2 99%    BMI 27.89 kg/m  General: Alert, oriented, no acute distress. HEENT: Normocephalic, atraumatic, sclera anicteric. Chest: Labored breathing on room air.  Bandage over recent port with some ecchymosis noted around. Abdomen: soft, nontender.  Normoactive bowel sounds.  No masses or hepatosplenomegaly appreciated.  Well-healed laparoscopic incisions. Extremities: Grossly normal range of motion.  Warm, well perfused.  No edema bilaterally. GU: Normal appearing external genitalia without erythema, excoriation, or lesions.  Speculum exam reveals vaginal cuff is intact, no bleeding or significant discharge.  Suture visible at apex.  Bimanual exam reveals cuff intact, no fluctuance or tenderness with palpation.    Laboratory & Radiologic Studies: None new  Assessment & Plan: [name] is a 71 y.o. woman with Stage IA serous uterine cancer who presents for phone follow-up after surgery, treatment discussion. HER2 negative.   The  patient is meeting postoperative milestones.  Discussed continued expectations as well as postoperative limitations.  Patient was given a copy of her final pathology report as well as MMR and MSI testing.  We reviewed this in detail again together.  Reviewed her diagnosis of early stage serous uterine cancer and recommendation for adjuvant  treatment. Patient has now met with Dr. Alvy Bimler and Dr. Sondra Come with plan for adjuvant chemotherapy and vaginal brachytherapy.  She is starting chemotherapy on Tuesday.   Patient is aware that she will see me again when she has finished adjuvant therapy to start her surveillance visits.  28 minutes of total time was spent for this patient encounter, including preparation, face-to-face counseling with the patient and coordination of care, and documentation of the encounter.  Jeral Pinch, MD  Division of Gynecologic Oncology  Department of Obstetrics and Gynecology  [facility]

## 2021-05-08 ENCOUNTER — Other Ambulatory Visit: Payer: Self-pay | Admitting: Hematology and Oncology

## 2021-05-08 MED FILL — Fosaprepitant Dimeglumine For IV Infusion 150 MG (Base Eq): INTRAVENOUS | Qty: 5 | Status: AC

## 2021-05-08 MED FILL — Dexamethasone Sodium Phosphate Inj 100 MG/10ML: INTRAMUSCULAR | Qty: 1 | Status: AC

## 2021-05-09 ENCOUNTER — Encounter: Payer: Self-pay | Admitting: Hematology and Oncology

## 2021-05-09 ENCOUNTER — Other Ambulatory Visit: Payer: Self-pay

## 2021-05-09 ENCOUNTER — Telehealth: Payer: Self-pay | Admitting: Hematology and Oncology

## 2021-05-09 ENCOUNTER — Inpatient Hospital Stay: Payer: Medicare Other

## 2021-05-09 ENCOUNTER — Inpatient Hospital Stay (HOSPITAL_BASED_OUTPATIENT_CLINIC_OR_DEPARTMENT_OTHER): Payer: Medicare Other | Admitting: Hematology and Oncology

## 2021-05-09 ENCOUNTER — Other Ambulatory Visit: Payer: Self-pay | Admitting: Hematology and Oncology

## 2021-05-09 VITALS — BP 186/87 | HR 120 | Temp 98.5°F | Resp 18

## 2021-05-09 DIAGNOSIS — C541 Malignant neoplasm of endometrium: Secondary | ICD-10-CM

## 2021-05-09 MED ORDER — DEXAMETHASONE 4 MG PO TABS: ORAL_TABLET | ORAL | 6 refills | Status: DC

## 2021-05-09 MED ORDER — SODIUM CHLORIDE 0.9 % IV SOLN
150.0000 mg | Freq: Once | INTRAVENOUS | Status: AC
  Administered 2021-05-09: 150 mg via INTRAVENOUS
  Filled 2021-05-09: qty 150

## 2021-05-09 MED ORDER — SODIUM CHLORIDE 0.9 % IV SOLN
10.0000 mg | Freq: Once | INTRAVENOUS | Status: AC
  Administered 2021-05-09: 10 mg via INTRAVENOUS
  Filled 2021-05-09: qty 10

## 2021-05-09 MED ORDER — ACETAMINOPHEN 325 MG PO TABS
650.0000 mg | ORAL_TABLET | Freq: Four times a day (QID) | ORAL | Status: DC | PRN
  Administered 2021-05-09: 650 mg via ORAL
  Filled 2021-05-09: qty 2

## 2021-05-09 MED ORDER — HEPARIN SOD (PORK) LOCK FLUSH 100 UNIT/ML IV SOLN
500.0000 [IU] | Freq: Once | INTRAVENOUS | Status: AC | PRN
  Administered 2021-05-09: 500 [IU]

## 2021-05-09 MED ORDER — SODIUM CHLORIDE 0.9 % IV SOLN: Freq: Once | INTRAVENOUS | Status: AC

## 2021-05-09 MED ORDER — DIPHENHYDRAMINE HCL 50 MG/ML IJ SOLN
25.0000 mg | Freq: Once | INTRAMUSCULAR | Status: AC
  Administered 2021-05-09: 25 mg via INTRAVENOUS
  Filled 2021-05-09: qty 1

## 2021-05-09 MED ORDER — FAMOTIDINE IN NACL 20-0.9 MG/50ML-% IV SOLN
20.0000 mg | Freq: Once | INTRAVENOUS | Status: AC
  Administered 2021-05-09: 20 mg via INTRAVENOUS
  Filled 2021-05-09: qty 50

## 2021-05-09 MED ORDER — PALONOSETRON HCL INJECTION 0.25 MG/5ML
0.2500 mg | Freq: Once | INTRAVENOUS | Status: AC
  Administered 2021-05-09: 0.25 mg via INTRAVENOUS
  Filled 2021-05-09: qty 5

## 2021-05-09 MED ORDER — SODIUM CHLORIDE 0.9 % IV SOLN
131.2500 mg/m2 | Freq: Once | INTRAVENOUS | Status: AC
  Administered 2021-05-09: 234 mg via INTRAVENOUS
  Filled 2021-05-09: qty 39

## 2021-05-09 MED ORDER — SODIUM CHLORIDE 0.9 % IV SOLN
508.8000 mg | Freq: Once | INTRAVENOUS | Status: AC
  Administered 2021-05-09: 510 mg via INTRAVENOUS
  Filled 2021-05-09: qty 51

## 2021-05-09 MED ORDER — SODIUM CHLORIDE 0.9% FLUSH
10.0000 mL | INTRAVENOUS | Status: DC | PRN
  Administered 2021-05-09: 10 mL

## 2021-05-09 NOTE — Assessment & Plan Note (Signed)
She was evaluated many times, over a course of 40 minutes to an hour for possible infusion reaction to paclitaxel Her treatment was discontinued around 1230 and then resume around 1 PM without difficulties It was felt that her sensation of discomfort or strange sensation in the throat could be related to steroids For her next treatment, I recommend the patient to admit morning dexamethasone prior to showing up for her next treatment The patient subsequently completed chemotherapy without difficulties Per previous discussion, we will omit G-CSF of her treatment plan due to insurance denial

## 2021-05-09 NOTE — Progress Notes (Signed)
Pt returned to infusion after feeling "off" when she got into her car. Pt c/o epigastric pain and upper back pain. BP 187/86, HR 120. Pt appears anxious. Dr. Alvy Bimler paged. Advised pt to take tylenol and benedryl. After 10 minutes pt BP decreased to 162/82 and HR 98. Pt given drink and emotional support. Pt states "it may be an esophageal spasm." Pt discharged in stable condition.

## 2021-05-09 NOTE — Progress Notes (Signed)
[facility] OFFICE PROGRESS NOTE  Patient Care Team: [name], MD as PCP - General (Family Medicine) [name], RN as Oncology Nurse Navigator (Oncology)  ASSESSMENT & PLAN:  Endometrial carcinoma [facility]) She was evaluated many times, over a course of 40 minutes to an hour for possible infusion reaction to paclitaxel Her treatment was discontinued around 1230 and then resume around 1 PM without difficulties It was felt that her sensation of discomfort or strange sensation in the throat could be related to steroids For her next treatment, I recommend the patient to admit morning dexamethasone prior to showing up for her next treatment The patient subsequently completed chemotherapy without difficulties Per previous discussion, we will omit G-CSF of her treatment plan due to insurance denial  No orders of the defined types were placed in this encounter.   All questions were answered. The patient knows to call the clinic with any problems, questions or concerns. The total time spent in the appointment was 40 minutes encounter with patients including review of chart and various tests results, discussions about plan of care and coordination of care plan   Heath Lark, MD 05/09/2021 3:37 PM  INTERVAL HISTORY: Please see below for problem oriented charting. she was seen in the infusion room many times due to possible reaction to paclitaxel She had as weird sensation in the throat She also have some headaches which she attributed to steroids Her treatment was postponed for about half an hour and resume without difficulties The patient has no residual effects from treatment and was discharged safely  REVIEW OF SYSTEMS:   Constitutional: Denies fevers, chills or abnormal weight loss Eyes: Denies blurriness of vision Ears, nose, mouth, throat, and face: Denies mucositis or sore throat Respiratory: Denies cough, dyspnea or wheezes Cardiovascular: Denies palpitation,  chest discomfort or lower extremity swelling Gastrointestinal:  Denies nausea, heartburn or change in bowel habits Skin: Denies abnormal skin rashes Lymphatics: Denies new lymphadenopathy or easy bruising Neurological:Denies numbness, tingling or new weaknesses Behavioral/Psych: Mood is stable, no new changes  All other systems were reviewed with the patient and are negative.  I have reviewed the past medical history, past surgical history, social history and family history with the patient and they are unchanged from previous note.  ALLERGIES:  is allergic to augmentin [amoxicillin-pot clavulanate], morphine and related, amoxicillin, asa [aspirin], banana, ceftin [cefuroxime axetil], citrus, lactose intolerance (gi), orange oil, penicillins, cefuroxime, clavulanic acid, tetanus-diphth-acell pertussis, and tape.  MEDICATIONS:  Current Outpatient Medications  Medication Sig Dispense Refill   acetaminophen (TYLENOL) 500 MG tablet Take 1,000 mg by mouth at bedtime as needed (Arthritis pain).     cyclobenzaprine (FLEXERIL) 10 MG tablet Take 5-10 mg by mouth at bedtime as needed for muscle spasms.  0   cycloSPORINE (RESTASIS) 0.05 % ophthalmic emulsion Place 1 drop into both eyes 2 (two) times daily.      dexamethasone (DECADRON) 4 MG tablet Take 2 tabs at the night before chemotherapy, every 3 weeks, by mouth x 6 cycles 36 tablet 6   esomeprazole (NEXIUM) 20 MG capsule Take 20 mg by mouth daily.     fluocinonide gel (LIDEX) 7.10 % Apply 1 application topically daily as needed (canker sores).      fluticasone (FLONASE) 50 MCG/ACT nasal spray Place 2 sprays into both nostrils daily.      hyoscyamine (LEVSIN) 0.125 MG tablet Take 0.125 mg by mouth 3 (three) times daily as needed (IBS).     Lactobacillus (ACIDOPHILUS PO) Take  5 mg by mouth daily.     lidocaine-prilocaine (EMLA) cream Apply to affected area once 30 g 3   MELOXICAM PO Take 15 mg by mouth daily as needed (Arthritis pain).     Multiple  Vitamin (MULTIVITAMIN) capsule Take 1 capsule by mouth in the morning.      Olopatadine HCl 0.2 % SOLN Place 1 drop into both eyes daily as needed (Allergies/Seasonal).     ondansetron (ZOFRAN) 8 MG tablet Take 1 tablet (8 mg total) by mouth 2 (two) times daily as needed. Start on the third day after chemotherapy. 30 tablet 1   Peppermint Oil (IBGARD PO) Take 180 mg by mouth daily as needed (IBS).     Polyethyl Glycol-Propyl Glycol (SYSTANE) 0.4-0.3 % SOLN Place 1 drop into both eyes daily as needed (Dry eye).     prochlorperazine (COMPAZINE) 10 MG tablet Take 1 tablet (10 mg total) by mouth every 6 (six) hours as needed (Nausea or vomiting). 30 tablet 1   senna-docusate (SENOKOT-S) 8.6-50 MG tablet Take 2 tablets by mouth at bedtime. For AFTER surgery, do not take if having diarrhea 30 tablet 0   vitamin E 400 UNIT capsule Take 400 Units by mouth daily.      No current facility-administered medications for this visit.   Facility-Administered Medications Ordered in Other Visits  Medication Dose Route Frequency Provider Last Rate Last Admin   acetaminophen (TYLENOL) tablet 650 mg  650 mg Oral Q6H PRN Alvy Bimler, Laurieanne Galloway, MD   650 mg at 05/09/21 1209   CARBOplatin (PARAPLATIN) 510 mg in sodium chloride 0.9 % 250 mL chemo infusion  510 mg Intravenous Once Alvy Bimler, Brittainy Bucker, MD       heparin lock flush 100 unit/mL  500 Units Intracatheter Once PRN Alvy Bimler, Elaisha Zahniser, MD       sodium chloride flush (NS) 0.9 % injection 10 mL  10 mL Intracatheter PRN Heath Lark, MD        SUMMARY OF ONCOLOGIC HISTORY: Oncology History Overview Note  CA-125 on 04/06/21: 10.2 High grade serous, Her 2 neg Genetics from 04/30/18: VUS   MMR IHC intact   History of lung cancer (Resolved)  04/28/2012 Initial Diagnosis   Lung cancer, diagnosed in March of 2006 stage II a status post left trisegmentectomy with lymph node dissection followed by 4 cycles of adjuvant chemotherapy   05/06/2018 Genetic Testing   MEN1 c.875C>T and MSH3 c.1366G>A  VUS identified on the multicancer gene panel.  The Multi-Gene Panel offered by [facility] includes sequencing and/or deletion duplication testing of the following 84 genes: AIP, ALK, APC, ATM, AXIN2,BAP1,  BARD1, BLM, BMPR1A, BRCA1, BRCA2, BRIP1, CASR, CDC73, CDH1, CDK4, CDKN1B, CDKN1C, CDKN2A (p14ARF), CDKN2A (p16INK4a), CEBPA, CHEK2, CTNNA1, DICER1, DIS3L2, EGFR (c.2369C>T, p.Thr790Met variant only), EPCAM (Deletion/duplication testing only), FH, FLCN, GATA2, GPC3, GREM1 (Promoter region deletion/duplication testing only), HOXB13 (c.251G>A, p.Gly84Glu), HRAS, KIT, MAX, MEN1, MET, MITF (c.952G>A, p.Glu318Lys variant only), MLH1, MSH2, MSH3, MSH6, MUTYH, NBN, NF1, NF2, NTHL1, PALB2, PDGFRA, PHOX2B, PMS2, POLD1, POLE, POT1, PRKAR1A, PTCH1, PTEN, RAD50, RAD51C, RAD51D, RB1, RECQL4, RET, RUNX1, SDHAF2, SDHA (sequence changes only), SDHB, SDHC, SDHD, SMAD4, SMARCA4, SMARCB1, SMARCE1, STK11, SUFU, TERC, TERT, TMEM127, TP53, TSC1, TSC2, VHL, WRN and WT1.  The report date is May 06, 2018.   History of ductal carcinoma in situ (DCIS) of breast  04/28/2012 Initial Diagnosis   Ductal carcinoma in situ of right breast diagnosed in December of 2013, status post right lumpectomy in January of 2014   05/06/2018 Genetic Testing  MEN1 c.875C>T and MSH3 c.1366G>A VUS identified on the multicancer gene panel.  The Multi-Gene Panel offered by [facility] includes sequencing and/or deletion duplication testing of the following 84 genes: AIP, ALK, APC, ATM, AXIN2,BAP1,  BARD1, BLM, BMPR1A, BRCA1, BRCA2, BRIP1, CASR, CDC73, CDH1, CDK4, CDKN1B, CDKN1C, CDKN2A (p14ARF), CDKN2A (p16INK4a), CEBPA, CHEK2, CTNNA1, DICER1, DIS3L2, EGFR (c.2369C>T, p.Thr790Met variant only), EPCAM (Deletion/duplication testing only), FH, FLCN, GATA2, GPC3, GREM1 (Promoter region deletion/duplication testing only), HOXB13 (c.251G>A, p.Gly84Glu), HRAS, KIT, MAX, MEN1, MET, MITF (c.952G>A, p.Glu318Lys variant only), MLH1, MSH2, MSH3, MSH6, MUTYH, NBN, NF1, NF2,  NTHL1, PALB2, PDGFRA, PHOX2B, PMS2, POLD1, POLE, POT1, PRKAR1A, PTCH1, PTEN, RAD50, RAD51C, RAD51D, RB1, RECQL4, RET, RUNX1, SDHAF2, SDHA (sequence changes only), SDHB, SDHC, SDHD, SMAD4, SMARCA4, SMARCB1, SMARCE1, STK11, SUFU, TERC, TERT, TMEM127, TP53, TSC1, TSC2, VHL, WRN and WT1.  The report date is May 06, 2018.   Endometrial carcinoma ([place])  01/27/2021 Initial Diagnosis   Patient began having vaginal spotting on November 10th.  She had 4 days of spotting which she describes as seeing red when she wiped and having a little brown discharge on her liner.  Bleeding then stopped completely.  She saw her primary care provider and underwent an ultrasound.  Ultrasound showed thickened endometrium measuring 14 mm.  She then started bleeding again on December 14 and had a week of bleeding with a little bit more red blood this time.  She ultimately saw OB/GYN and had an endometrial biopsy.  This was first attempted however secondary to cervical stenosis, patient returned the following day after using intravaginal misoprostol.  Endometrial biopsy on 12/22 revealed FIGO grade 2/3 endometrial cancer.   02/27/2021 Imaging   US pelvis Thickened endometrium with possible 14 mm endometrial mass. In the setting of post-menopausal bleeding, endometrial sampling is indicated to exclude carcinoma   03/06/2021 Initial Biopsy   Endometrial biopsy revealed FIGO grade 2/3 endometrial cancer   03/24/2021 Initial Diagnosis   Endometrial carcinoma ([place])   03/30/2021 Imaging   CT A/P: 1. Abnormal thickening of the endometrium, as seen on prior ultrasound and consistent with recently diagnosed endometrial malignancy. 2. No evidence of lymphadenopathy or metastatic disease in the abdomen or pelvis. 3. Subcapsular hyperenhancing lesion of the lateral inferior right lobe of the liver, hepatic segment VI is only very faintly appreciated on this portal venous phase contrast examination, better characterized as a benign focal  nodular hyperplasia by prior MR. 4. Diverticulosis.   04/11/2021 Surgery   Robotic-assisted laparoscopic total hysterectomy with bilateral salpingoophorectomy, SLN biopsy, omental biopsy, cystoscopy   Findings: On EUA, small mobile uterus. On intra-abdominal entry, normal upper abdominal survey. Normal omentum, small bowel. Large bowels with some filmy adhesions to the left pelvic sidewall and the left adhesions. Filmy adhesions also noted from the sigmoid to the posterior uterus (some of the adhesions biopsied). Uterus 8cm and normal in appearance. Atrophic appearing adnexa. Mapping successful to bilateral SLNs. No obvious adenopathy. Minimal adhesions of the bladder to the LUS. No intra-abdominal or pelvic evidence of disease. Given concentrated appearing urine and low UOP, cystoscopy was performed. Bladder dome intact (bubbles seen). Good efflux noted from bilateral ureteral orifices.    04/11/2021 Pathologic Stage   Stage IA serous uterine cancer No LVI, negative SLNs Omentum negative, washings nondiagnostic (insufficient cellularity) HER2 negative   04/18/2021 Cancer Staging   Staging form: Corpus Uteri - Carcinoma and Carcinosarcoma, AJCC 8th Edition - Pathologic stage from 04/18/2021: FIGO Stage IA (pT1a, pN0, cM0) - Signed by Heath Lark, MD on 04/18/2021 Stage  prefix: Initial diagnosis    05/05/2021 Procedure   Successful placement of a RIGHT internal jugular approach power injectable Port-A-Cath.   The tip of the catheter is positioned within the proximal RIGHT atrium. The catheter is ready for immediate use   05/09/2021 -  Chemotherapy   Patient is on Treatment Plan : UTERINE Carboplatin AUC 6 / Paclitaxel q21d       PHYSICAL EXAMINATION: ECOG PERFORMANCE STATUS: 1 - Symptomatic but completely ambulatory GENERAL:alert, no distress and comfortable SKIN: skin color, texture, turgor are normal, no rashes or significant lesions EYES: normal, Conjunctiva are pink and non-injected,  sclera clear OROPHARYNX:no exudate, no erythema and lips, buccal mucosa, and tongue normal  NECK: supple, thyroid normal size, non-tender, without nodularity LYMPH:  no palpable lymphadenopathy in the cervical, axillary or inguinal LUNGS: clear to auscultation and percussion with normal breathing effort HEART: regular rate & rhythm and no murmurs and no lower extremity edema ABDOMEN:abdomen soft, non-tender and normal bowel sounds Musculoskeletal:no cyanosis of digits and no clubbing  NEURO: alert & oriented x 3 with fluent speech, no focal motor/sensory deficits  LABORATORY DATA:  I have reviewed the data as listed    Component Value Date/Time   NA 141 05/05/2021 1247   NA 145 (H) 05/06/2019 1021   NA 141 10/29/2016 0742   K 4.3 05/05/2021 1247   K 4.1 10/29/2016 0742   CL 109 05/05/2021 1247   CL 108 (H) 04/28/2012 1008   CO2 25 05/05/2021 1247   CO2 26 10/29/2016 0742   GLUCOSE 101 (H) 05/05/2021 1247   GLUCOSE 95 10/29/2016 0742   GLUCOSE 99 04/28/2012 1008   BUN 12 05/05/2021 1247   BUN 15 05/06/2019 1021   BUN 21.1 10/29/2016 0742   CREATININE 0.71 05/05/2021 1247   CREATININE 0.9 10/29/2016 0742   CALCIUM 9.0 05/05/2021 1247   CALCIUM 9.0 10/29/2016 0742   PROT 6.8 05/05/2021 1247   PROT 6.4 05/06/2019 1021   PROT 6.6 10/29/2016 0742   ALBUMIN 3.8 05/05/2021 1247   ALBUMIN 3.4 (L) 10/29/2016 0742   AST 17 05/05/2021 1247   AST 15 10/29/2016 0742   ALT 14 05/05/2021 1247   ALT 12 10/29/2016 0742   ALKPHOS 78 05/05/2021 1247   ALKPHOS 49 10/29/2016 0742   BILITOT 0.4 05/05/2021 1247   BILITOT 0.32 10/29/2016 0742   GFRNONAA >60 05/05/2021 1247   GFRAA >60 12/18/2019 1114   GFRAA >60 07/15/2019 0847    No results found for: SPEP, UPEP  Lab Results  Component Value Date   WBC 6.6 05/05/2021   NEUTROABS 4.0 05/05/2021   HGB 12.6 05/05/2021   HCT 38.4 05/05/2021   MCV 91.4 05/05/2021   PLT 286 05/05/2021      Chemistry      Component Value Date/Time    NA 141 05/05/2021 1247   NA 145 (H) 05/06/2019 1021   NA 141 10/29/2016 0742   K 4.3 05/05/2021 1247   K 4.1 10/29/2016 0742   CL 109 05/05/2021 1247   CL 108 (H) 04/28/2012 1008   CO2 25 05/05/2021 1247   CO2 26 10/29/2016 0742   BUN 12 05/05/2021 1247   BUN 15 05/06/2019 1021   BUN 21.1 10/29/2016 0742   CREATININE 0.71 05/05/2021 1247   CREATININE 0.9 10/29/2016 0742      Component Value Date/Time   CALCIUM 9.0 05/05/2021 1247   CALCIUM 9.0 10/29/2016 0742   ALKPHOS 78 05/05/2021 1247   ALKPHOS 49 10/29/2016 0742  AST 17 05/05/2021 1247   AST 15 10/29/2016 0742   ALT 14 05/05/2021 1247   ALT 12 10/29/2016 0742   BILITOT 0.4 05/05/2021 1247   BILITOT 0.32 10/29/2016 5462

## 2021-05-09 NOTE — Progress Notes (Signed)
At 1229 patient complained of her ears feeling flushed, heart rate dropped to 46 then increased to 125 on dinamap. Taxol stopped, Dr. Alvy Bimler to chairside for assessment. Advised to restart taxol at 1300 to give patient a rest. Once infusion was restarted patient tolerated treatment well. No complaints other than a lingering headache. Vital signs remained stable there after.

## 2021-05-09 NOTE — Patient Instructions (Signed)
[facility] MEDICAL ONCOLOGY  Discharge Instructions: Thank you for choosing [facility] to provide your oncology and hematology care.   If you have a lab appointment with the Cancer Center, please go directly to the Cancer Center and check in at the registration area.   Wear comfortable clothing and clothing appropriate for easy access to any Portacath or PICC line.   We strive to give you quality time with your provider. You may need to reschedule your appointment if you arrive late (15 or more minutes).  Arriving late affects you and other patients whose appointments are after yours.  Also, if you miss three or more appointments without notifying the office, you may be dismissed from the clinic at the provider's discretion.      For prescription refill requests, have your pharmacy contact our office and allow 72 hours for refills to be completed.    Today you received the following chemotherapy and/or immunotherapy agents Taxol and Carboplatin       To help prevent nausea and vomiting after your treatment, we encourage you to take your nausea medication as directed.  BELOW ARE SYMPTOMS THAT SHOULD BE REPORTED IMMEDIATELY: *FEVER GREATER THAN 100.4 F (38 C) OR HIGHER *CHILLS OR SWEATING *NAUSEA AND VOMITING THAT IS NOT CONTROLLED WITH YOUR NAUSEA MEDICATION *UNUSUAL SHORTNESS OF BREATH *UNUSUAL BRUISING OR BLEEDING *URINARY PROBLEMS (pain or burning when urinating, or frequent urination) *BOWEL PROBLEMS (unusual diarrhea, constipation, pain near the anus) TENDERNESS IN MOUTH AND THROAT WITH OR WITHOUT PRESENCE OF ULCERS (sore throat, sores in mouth, or a toothache) UNUSUAL RASH, SWELLING OR PAIN  UNUSUAL VAGINAL DISCHARGE OR ITCHING   Items with * indicate a potential emergency and should be followed up as soon as possible or go to the Emergency Department if any problems should occur.  Please show the CHEMOTHERAPY ALERT CARD or IMMUNOTHERAPY ALERT CARD at  check-in to the Emergency Department and triage nurse.  Should you have questions after your visit or need to cancel or reschedule your appointment, please contact [facility]  Dept: [phone]  and follow the prompts.  Office hours are 8:00 a.m. to 4:30 p.m. Monday - Friday. Please note that voicemails left after 4:00 p.m. may not be returned until the following business day.  We are closed weekends and major holidays. You have access to a nurse at all times for urgent questions. Please call the main number to the clinic Dept: [phone] and follow the prompts.   For any non-urgent questions, you may also contact your provider using MyChart. We now offer e-Visits for anyone 18 and older to request care online for non-urgent symptoms. For details visit [email or web address].   Also download the MyChart app! Go to the app store, search "MyChart", open the app, select [facility], and log in with your MyChart username and password.  Due to Covid, a mask is required upon entering the hospital/clinic. If you do not have a mask, one will be given to you upon arrival. For doctor visits, patients may have 1 support person aged 18 or older with them. For treatment visits, patients cannot have anyone with them due to current Covid guidelines and our immunocompromised population.   

## 2021-05-09 NOTE — Telephone Encounter (Signed)
I spoke with the patient and explained to her that insurance has declined prior authorization for G-CSF injection I tried to reassure the patient that the dose is being used would be much lower than her previous dose and the risk of neutropenic fever is low I encouraged the patient to call us if she started to feel unwell with signs and symptoms of infection She is in agreement to proceed with chemotherapy without G-CSF support

## 2021-05-10 ENCOUNTER — Telehealth: Payer: Self-pay

## 2021-05-10 ENCOUNTER — Telehealth: Payer: Self-pay | Admitting: Hematology and Oncology

## 2021-05-10 NOTE — Telephone Encounter (Signed)
Spoke with the patient this morning.  She suspect her abdominal pain yesterday is related to muscle spasm.  She feels okay this morning.  The patient is reassured.  She knows how to call us if questions arise

## 2021-05-10 NOTE — Telephone Encounter (Signed)
[name] is feeling better today. She is less jittery.  She has no N/V. Drinking and urinating well. She is working on her constipation this am with prune juice and stool softeners. She is passing gas. Dr. Alvy Bimler called to followup with her as well this am. Pt appreciative of the care. Pt knows to call [phone] if she has any questions or concerns.

## 2021-05-11 ENCOUNTER — Inpatient Hospital Stay: Payer: Medicare Other

## 2021-05-12 ENCOUNTER — Telehealth: Payer: Self-pay | Admitting: Oncology

## 2021-05-12 NOTE — Telephone Encounter (Signed)
Called Tiffany and advised her of message below.  She is going to try Claritin 10 mg daily as well.

## 2021-05-12 NOTE — Telephone Encounter (Signed)
Ok to take tylenol and claritin 10 mg might also help

## 2021-05-12 NOTE — Telephone Encounter (Signed)
[name] called and said she is starting to have aches from chemo on Tuesday.  She is wondering if it is ok to take Tylenol or what she can take.  She was told with her previous chemo not to take Tylenol because it might mask a fever.  Advised her it is ok to take Tylenol and to call back if it does not help.

## 2021-05-16 ENCOUNTER — Telehealth: Payer: Self-pay | Admitting: Oncology

## 2021-05-16 NOTE — Telephone Encounter (Signed)
[name] called and said she has been having digestive issues since her infusion on Friday.  She describes it like an esophageal spasm and it feels like someone is stabbing her in the back.  She said it happened after chemo on Tuesday and she returned to the cancer center to be evaluated.  She also felt like it was happening all day Friday and Saturday.  She also had an episode today.  She has noticed it happens after sitting for a while and then getting up to walk.  It is not related to eating.  She is able to relieve it by drinking ginger ale.    She is wondering if Dr. Alvy Bimler has any recommendations and if it is ok to take her IBS medications - hyoscyamine and flexeril that might help.  She wants to try to avoid this pain with her next cycle.

## 2021-05-17 ENCOUNTER — Encounter: Payer: Self-pay | Admitting: Hematology and Oncology

## 2021-05-17 NOTE — Telephone Encounter (Signed)
Tobias Alexander of message below.  She verbalized understanding and agreement.

## 2021-05-17 NOTE — Telephone Encounter (Signed)
Called [name] and advised her of message below.  She verbalized understanding and said she is taking Nexium 10 mg in the morning and asked if she could start taking it twice a day (morning and night) instead of the prilosec/pepcid.

## 2021-05-17 NOTE — Telephone Encounter (Signed)
Yes Chewing TUMS would also help

## 2021-05-17 NOTE — Telephone Encounter (Signed)
This is probably triggered by recent steroids She should take prilosec OTC in the morning and pepcid at night Mylanta can also help I do not recommend hyoscyamine, flexeril is for muscle spasm, not esophageal spasm She can skip morning steroid dose in the future

## 2021-05-29 MED FILL — Fosaprepitant Dimeglumine For IV Infusion 150 MG (Base Eq): INTRAVENOUS | Qty: 5 | Status: AC

## 2021-05-29 MED FILL — Dexamethasone Sodium Phosphate Inj 100 MG/10ML: INTRAMUSCULAR | Qty: 1 | Status: AC

## 2021-05-30 ENCOUNTER — Other Ambulatory Visit: Payer: Self-pay

## 2021-05-30 ENCOUNTER — Inpatient Hospital Stay: Payer: Medicare Other | Attending: Gynecologic Oncology

## 2021-05-30 ENCOUNTER — Inpatient Hospital Stay (HOSPITAL_BASED_OUTPATIENT_CLINIC_OR_DEPARTMENT_OTHER): Payer: Medicare Other | Admitting: Hematology and Oncology

## 2021-05-30 ENCOUNTER — Inpatient Hospital Stay: Payer: Medicare Other

## 2021-05-30 DIAGNOSIS — K219 Gastro-esophageal reflux disease without esophagitis: Secondary | ICD-10-CM

## 2021-05-30 DIAGNOSIS — T451X5A Adverse effect of antineoplastic and immunosuppressive drugs, initial encounter: Secondary | ICD-10-CM | POA: Insufficient documentation

## 2021-05-30 DIAGNOSIS — D6481 Anemia due to antineoplastic chemotherapy: Secondary | ICD-10-CM | POA: Diagnosis not present

## 2021-05-30 DIAGNOSIS — G620 Drug-induced polyneuropathy: Secondary | ICD-10-CM

## 2021-05-30 DIAGNOSIS — Z79899 Other long term (current) drug therapy: Secondary | ICD-10-CM | POA: Diagnosis not present

## 2021-05-30 DIAGNOSIS — R5383 Other fatigue: Secondary | ICD-10-CM | POA: Insufficient documentation

## 2021-05-30 DIAGNOSIS — Z5111 Encounter for antineoplastic chemotherapy: Secondary | ICD-10-CM | POA: Insufficient documentation

## 2021-05-30 DIAGNOSIS — C541 Malignant neoplasm of endometrium: Secondary | ICD-10-CM

## 2021-05-30 DIAGNOSIS — Z7952 Long term (current) use of systemic steroids: Secondary | ICD-10-CM | POA: Insufficient documentation

## 2021-05-30 DIAGNOSIS — Z79621 Long term (current) use of calcineurin inhibitor: Secondary | ICD-10-CM | POA: Insufficient documentation

## 2021-05-30 DIAGNOSIS — Z791 Long term (current) use of non-steroidal anti-inflammatories (NSAID): Secondary | ICD-10-CM | POA: Diagnosis not present

## 2021-05-30 DIAGNOSIS — R109 Unspecified abdominal pain: Secondary | ICD-10-CM | POA: Insufficient documentation

## 2021-05-30 LAB — CBC WITH DIFFERENTIAL (CANCER CENTER ONLY)
Abs Immature Granulocytes: 0.1 10*3/uL — ABNORMAL HIGH (ref 0.00–0.07)
Basophils Absolute: 0 10*3/uL (ref 0.0–0.1)
Basophils Relative: 0 %
Eosinophils Absolute: 0 10*3/uL (ref 0.0–0.5)
Eosinophils Relative: 0 %
HCT: 35.6 % — ABNORMAL LOW (ref 36.0–46.0)
Hemoglobin: 12.1 g/dL (ref 12.0–15.0)
Immature Granulocytes: 1 %
Lymphocytes Relative: 12 %
Lymphs Abs: 0.9 10*3/uL (ref 0.7–4.0)
MCH: 30.3 pg (ref 26.0–34.0)
MCHC: 34 g/dL (ref 30.0–36.0)
MCV: 89.2 fL (ref 80.0–100.0)
Monocytes Absolute: 0.2 10*3/uL (ref 0.1–1.0)
Monocytes Relative: 3 %
Neutro Abs: 6.2 10*3/uL (ref 1.7–7.7)
Neutrophils Relative %: 84 %
Platelet Count: 290 10*3/uL (ref 150–400)
RBC: 3.99 MIL/uL (ref 3.87–5.11)
RDW: 13.6 % (ref 11.5–15.5)
WBC Count: 7.4 10*3/uL (ref 4.0–10.5)
nRBC: 0 % (ref 0.0–0.2)

## 2021-05-30 LAB — CMP (CANCER CENTER ONLY)
ALT: 15 U/L (ref 0–44)
AST: 19 U/L (ref 15–41)
Albumin: 3.9 g/dL (ref 3.5–5.0)
Alkaline Phosphatase: 77 U/L (ref 38–126)
Anion gap: 8 (ref 5–15)
BUN: 17 mg/dL (ref 8–23)
CO2: 25 mmol/L (ref 22–32)
Calcium: 9.2 mg/dL (ref 8.9–10.3)
Chloride: 107 mmol/L (ref 98–111)
Creatinine: 0.6 mg/dL (ref 0.44–1.00)
GFR, Estimated: >60 mL/min (ref >60)
Glucose, Bld: 124 mg/dL — ABNORMAL HIGH (ref 70–99)
Potassium: 3.9 mmol/L (ref 3.5–5.1)
Sodium: 140 mmol/L (ref 135–145)
Total Bilirubin: 0.3 mg/dL (ref 0.3–1.2)
Total Protein: 7.2 g/dL (ref 6.5–8.1)

## 2021-05-30 MED ORDER — SODIUM CHLORIDE 0.9 % IV SOLN
131.2500 mg/m2 | Freq: Once | INTRAVENOUS | Status: AC
  Administered 2021-05-30: 234 mg via INTRAVENOUS
  Filled 2021-05-30: qty 39

## 2021-05-30 MED ORDER — PALONOSETRON HCL INJECTION 0.25 MG/5ML
0.2500 mg | Freq: Once | INTRAVENOUS | Status: AC
  Administered 2021-05-30: 0.25 mg via INTRAVENOUS
  Filled 2021-05-30: qty 5

## 2021-05-30 MED ORDER — SODIUM CHLORIDE 0.9% FLUSH
10.0000 mL | INTRAVENOUS | Status: DC | PRN
  Administered 2021-05-30: 10 mL

## 2021-05-30 MED ORDER — HEPARIN SOD (PORK) LOCK FLUSH 100 UNIT/ML IV SOLN
500.0000 [IU] | Freq: Once | INTRAVENOUS | Status: AC | PRN
  Administered 2021-05-30: 500 [IU]

## 2021-05-30 MED ORDER — SODIUM CHLORIDE 0.9 % IV SOLN
508.8000 mg | Freq: Once | INTRAVENOUS | Status: AC
  Administered 2021-05-30: 510 mg via INTRAVENOUS
  Filled 2021-05-30: qty 51

## 2021-05-30 MED ORDER — SODIUM CHLORIDE 0.9 % IV SOLN: Freq: Once | INTRAVENOUS | Status: AC

## 2021-05-30 MED ORDER — SODIUM CHLORIDE 0.9 % IV SOLN
150.0000 mg | Freq: Once | INTRAVENOUS | Status: AC
  Administered 2021-05-30: 150 mg via INTRAVENOUS
  Filled 2021-05-30: qty 150

## 2021-05-30 MED ORDER — FAMOTIDINE IN NACL 20-0.9 MG/50ML-% IV SOLN
20.0000 mg | Freq: Once | INTRAVENOUS | Status: AC
  Administered 2021-05-30: 20 mg via INTRAVENOUS
  Filled 2021-05-30: qty 50

## 2021-05-30 MED ORDER — SODIUM CHLORIDE 0.9 % IV SOLN
10.0000 mg | Freq: Once | INTRAVENOUS | Status: AC
  Administered 2021-05-30: 10 mg via INTRAVENOUS
  Filled 2021-05-30: qty 10

## 2021-05-30 MED ORDER — DIPHENHYDRAMINE HCL 50 MG/ML IJ SOLN
25.0000 mg | Freq: Once | INTRAMUSCULAR | Status: AC
  Administered 2021-05-30: 25 mg via INTRAVENOUS
  Filled 2021-05-30: qty 1

## 2021-05-30 MED ORDER — SODIUM CHLORIDE 0.9% FLUSH
10.0000 mL | Freq: Once | INTRAVENOUS | Status: AC
  Administered 2021-05-30: 10 mL

## 2021-05-30 NOTE — Patient Instructions (Signed)
[place]  Discharge Instructions: ?Thank you for choosing [facility] to provide your oncology and hematology care.  ? ?If you have a lab appointment with the [facility], please go directly to the [facility] and check in at the registration area. ?  ?Wear comfortable clothing and clothing appropriate for easy access to any Portacath or PICC line.  ? ?We strive to give you quality time with your provider. You may need to reschedule your appointment if you arrive late (15 or more minutes).  Arriving late affects you and other patients whose appointments are after yours.  Also, if you miss three or more appointments without notifying the office, you may be dismissed from the clinic at the provider?s discretion.    ?  ?For prescription refill requests, have your pharmacy contact our office and allow 72 hours for refills to be completed.   ? ?Today you received the following chemotherapy and/or immunotherapy agents: Taxol (paclitaxel), carboplatin    ?  ?To help prevent nausea and vomiting after your treatment, we encourage you to take your nausea medication as directed. ? ?BELOW ARE SYMPTOMS THAT SHOULD BE REPORTED IMMEDIATELY: ?*FEVER GREATER THAN 100.4 F (38 ?C) OR HIGHER ?*CHILLS OR SWEATING ?*NAUSEA AND VOMITING THAT IS NOT CONTROLLED WITH YOUR NAUSEA MEDICATION ?*UNUSUAL SHORTNESS OF BREATH ?*UNUSUAL BRUISING OR BLEEDING ?*URINARY PROBLEMS (pain or burning when urinating, or frequent urination) ?*BOWEL PROBLEMS (unusual diarrhea, constipation, pain near the anus) ?TENDERNESS IN MOUTH AND THROAT WITH OR WITHOUT PRESENCE OF ULCERS (sore throat, sores in mouth, or a toothache) ?UNUSUAL RASH, SWELLING OR PAIN  ?UNUSUAL VAGINAL DISCHARGE OR ITCHING  ? ?Items with * indicate a potential emergency and should be followed up as soon as possible or go to the Emergency Department if any problems should occur. ? ?Please show the CHEMOTHERAPY ALERT CARD or IMMUNOTHERAPY  ALERT CARD at check-in to the Emergency Department and triage nurse. ? ?Should you have questions after your visit or need to cancel or reschedule your appointment, please contact [facility]  Dept: [phone]  and follow the prompts.  Office hours are 8:00 a.m. to 4:30 p.m. Monday - Friday. Please note that voicemails left after 4:00 p.m. may not be returned until the following business day.  We are closed weekends and major holidays. You have access to a nurse at all times for urgent questions. Please call the main number to the clinic Dept: [phone] and follow the prompts. ? ? ?For any non-urgent questions, you may also contact your provider using MyChart. We now offer e-Visits for anyone 70 and older to request care online for non-urgent symptoms. For details visit [email or web address]. ?  ?Also download the MyChart app! Go to the app store, search "MyChart", open the app, select [facility], and log in with your MyChart username and password. ? ?Due to Covid, a mask is required upon entering the hospital/clinic. If you do not have a mask, one will be given to you upon arrival. For doctor visits, patients may have 1 support person aged 70 or older with them. For treatment visits, patients cannot have anyone with them due to current Covid guidelines and our immunocompromised population.  ? ?

## 2021-05-31 ENCOUNTER — Encounter: Payer: Self-pay | Admitting: Hematology and Oncology

## 2021-05-31 DIAGNOSIS — K219 Gastro-esophageal reflux disease without esophagitis: Secondary | ICD-10-CM | POA: Insufficient documentation

## 2021-05-31 NOTE — Assessment & Plan Note (Signed)
I believe the patient has significant side effects related to steroid ?I recommend the patient to take additional proton pump inhibitor, antiacid or Tums as needed ?

## 2021-05-31 NOTE — Assessment & Plan Note (Signed)
She denies significant additional worsening neuropathy ?We will proceed with caution ?We discussed importance of cryotherapy during treatment ?

## 2021-05-31 NOTE — Progress Notes (Signed)
[facility] OFFICE PROGRESS NOTE  Patient Care Team: [name], MD as PCP - General (Family Medicine) Paulina Fusi Servando Snare, RN as Oncology Nurse Navigator (Oncology)  ASSESSMENT & PLAN:  Endometrial carcinoma [facility]) She had significant side effects with cycle 1 of treatment which I attributed that to side effects of steroids Per previous discussion, I am reducing her premed oral dexamethasone and advised the patient to take additional antiacid medicine as needed She finally got prior authorization for approval for G-CSF support but her blood count is completely normal and I do not feel strongly she needs G-CSF support She will start radiation therapy soon We will monitor closely for additional side effects of treatment  Peripheral neuropathy due to chemotherapy [facility]) She denies significant additional worsening neuropathy We will proceed with caution We discussed importance of cryotherapy during treatment  GERD (gastroesophageal reflux disease) I believe the patient has significant side effects related to steroid I recommend the patient to take additional proton pump inhibitor, antiacid or Tums as needed  No orders of the defined types were placed in this encounter.   All questions were answered. The patient knows to call the clinic with any problems, questions or concerns. The total time spent in the appointment was 30 minutes encounter with patients including review of chart and various tests results, discussions about plan of care and coordination of care plan   Artis Delay, MD 05/31/2021 3:06 PM  INTERVAL HISTORY: Please see below for problem oriented charting. she returns for treatment follow-up for cycle 2 of adjuvant treatment for uterine cancer with carboplatin and paclitaxel She developed low-grade fever of 100.3 recently that resolved spontaneously She has intermittent abdominal cramping Denies significant peripheral neuropathy She has occasional discomfort of  what she describes sounds like GERD symptoms Her weight and appetite is stable  REVIEW OF SYSTEMS:   Constitutional: Denies fevers, chills or abnormal weight loss Eyes: Denies blurriness of vision Ears, nose, mouth, throat, and face: Denies mucositis or sore throat Respiratory: Denies cough, dyspnea or wheezes Cardiovascular: Denies palpitation, chest discomfort or lower extremity swelling Skin: Denies abnormal skin rashes Lymphatics: Denies new lymphadenopathy or easy bruising Behavioral/Psych: Mood is stable, no new changes  All other systems were reviewed with the patient and are negative.  I have reviewed the past medical history, past surgical history, social history and family history with the patient and they are unchanged from previous note.  ALLERGIES:  is allergic to augmentin [amoxicillin-pot clavulanate], morphine and related, amoxicillin, asa [aspirin], banana, ceftin [cefuroxime axetil], citrus, lactose intolerance (gi), orange oil, penicillins, cefuroxime, clavulanic acid, tetanus-diphth-acell pertussis, and tape.  MEDICATIONS:  Current Outpatient Medications  Medication Sig Dispense Refill   acetaminophen (TYLENOL) 500 MG tablet Take 1,000 mg by mouth at bedtime as needed (Arthritis pain).     cyclobenzaprine (FLEXERIL) 10 MG tablet Take 5-10 mg by mouth at bedtime as needed for muscle spasms.  0   cycloSPORINE (RESTASIS) 0.05 % ophthalmic emulsion Place 1 drop into both eyes 2 (two) times daily.      dexamethasone (DECADRON) 4 MG tablet Take 2 tabs at the night before chemotherapy, every 3 weeks, by mouth x 6 cycles 36 tablet 6   esomeprazole (NEXIUM) 20 MG capsule Take 20 mg by mouth daily.     fluocinonide gel (LIDEX) 0.05 % Apply 1 application topically daily as needed (canker sores).      fluticasone (FLONASE) 50 MCG/ACT nasal spray Place 2 sprays into both nostrils daily.  hyoscyamine (LEVSIN) 0.125 MG tablet Take 0.125 mg by mouth 3 (three) times daily as needed  (IBS).     Lactobacillus (ACIDOPHILUS PO) Take 5 mg by mouth daily.     lidocaine-prilocaine (EMLA) cream Apply to affected area once 30 g 3   MELOXICAM PO Take 15 mg by mouth daily as needed (Arthritis pain).     Multiple Vitamin (MULTIVITAMIN) capsule Take 1 capsule by mouth in the morning.      Olopatadine HCl 0.2 % SOLN Place 1 drop into both eyes daily as needed (Allergies/Seasonal).     ondansetron (ZOFRAN) 8 MG tablet Take 1 tablet (8 mg total) by mouth 2 (two) times daily as needed. Start on the third day after chemotherapy. 30 tablet 1   Peppermint Oil (IBGARD PO) Take 180 mg by mouth daily as needed (IBS).     Polyethyl Glycol-Propyl Glycol (SYSTANE) 0.4-0.3 % SOLN Place 1 drop into both eyes daily as needed (Dry eye).     prochlorperazine (COMPAZINE) 10 MG tablet Take 1 tablet (10 mg total) by mouth every 6 (six) hours as needed (Nausea or vomiting). 30 tablet 1   senna-docusate (SENOKOT-S) 8.6-50 MG tablet Take 2 tablets by mouth at bedtime. For AFTER surgery, do not take if having diarrhea 30 tablet 0   vitamin E 400 UNIT capsule Take 400 Units by mouth daily.      No current facility-administered medications for this visit.    SUMMARY OF ONCOLOGIC HISTORY: Oncology History Overview Note  CA-125 on 04/06/21: 10.2 High grade serous, Her 2 neg Genetics from 04/30/18: VUS   MMR IHC intact   History of lung cancer (Resolved)  04/28/2012 Initial Diagnosis   Lung cancer, diagnosed in March of 2006 stage II a status post left trisegmentectomy with lymph node dissection followed by 4 cycles of adjuvant chemotherapy   05/06/2018 Genetic Testing   MEN1 c.875C>T and MSH3 c.1366G>A VUS identified on the multicancer gene panel.  The Multi-Gene Panel offered by [facility] includes sequencing and/or deletion duplication testing of the following 84 genes: AIP, ALK, APC, ATM, AXIN2,BAP1,  BARD1, BLM, BMPR1A, BRCA1, BRCA2, BRIP1, CASR, CDC73, CDH1, CDK4, CDKN1B, CDKN1C, CDKN2A (p14ARF), CDKN2A  (p16INK4a), CEBPA, CHEK2, CTNNA1, DICER1, DIS3L2, EGFR (c.2369C>T, p.Thr790Met variant only), EPCAM (Deletion/duplication testing only), FH, FLCN, GATA2, GPC3, GREM1 (Promoter region deletion/duplication testing only), HOXB13 (c.251G>A, p.Gly84Glu), HRAS, KIT, MAX, MEN1, MET, MITF (c.952G>A, p.Glu318Lys variant only), MLH1, MSH2, MSH3, MSH6, MUTYH, NBN, NF1, NF2, NTHL1, PALB2, PDGFRA, PHOX2B, PMS2, POLD1, POLE, POT1, PRKAR1A, PTCH1, PTEN, RAD50, RAD51C, RAD51D, RB1, RECQL4, RET, RUNX1, SDHAF2, SDHA (sequence changes only), SDHB, SDHC, SDHD, SMAD4, SMARCA4, SMARCB1, SMARCE1, STK11, SUFU, TERC, TERT, TMEM127, TP53, TSC1, TSC2, VHL, WRN and WT1.  The report date is May 06, 2018.   History of ductal carcinoma in situ (DCIS) of breast  04/28/2012 Initial Diagnosis   Ductal carcinoma in situ of right breast diagnosed in December of 2013, status post right lumpectomy in January of 2014   05/06/2018 Genetic Testing   MEN1 c.875C>T and MSH3 c.1366G>A VUS identified on the multicancer gene panel.  The Multi-Gene Panel offered by [facility] includes sequencing and/or deletion duplication testing of the following 84 genes: AIP, ALK, APC, ATM, AXIN2,BAP1,  BARD1, BLM, BMPR1A, BRCA1, BRCA2, BRIP1, CASR, CDC73, CDH1, CDK4, CDKN1B, CDKN1C, CDKN2A (p14ARF), CDKN2A (p16INK4a), CEBPA, CHEK2, CTNNA1, DICER1, DIS3L2, EGFR (c.2369C>T, p.Thr790Met variant only), EPCAM (Deletion/duplication testing only), FH, FLCN, GATA2, GPC3, GREM1 (Promoter region deletion/duplication testing only), HOXB13 (c.251G>A, p.Gly84Glu), HRAS, KIT, MAX, MEN1, MET, MITF (c.952G>A,  p.Glu318Lys variant only), MLH1, MSH2, MSH3, MSH6, MUTYH, NBN, NF1, NF2, NTHL1, PALB2, PDGFRA, PHOX2B, PMS2, POLD1, POLE, POT1, PRKAR1A, PTCH1, PTEN, RAD50, RAD51C, RAD51D, RB1, RECQL4, RET, RUNX1, SDHAF2, SDHA (sequence changes only), SDHB, SDHC, SDHD, SMAD4, SMARCA4, SMARCB1, SMARCE1, STK11, SUFU, TERC, TERT, TMEM127, TP53, TSC1, TSC2, VHL, WRN and WT1.  The report date is  May 06, 2018.   Endometrial carcinoma (HCC)  01/27/2021 Initial Diagnosis   Patient began having vaginal spotting on November 10th.  She had 4 days of spotting which she describes as seeing red when she wiped and having a little brown discharge on her liner.  Bleeding then stopped completely.  She saw her primary care provider and underwent an ultrasound.  Ultrasound showed thickened endometrium measuring 14 mm.  She then started bleeding again on December 14 and had a week of bleeding with a little bit more red blood this time.  She ultimately saw OB/GYN and had an endometrial biopsy.  This was first attempted however secondary to cervical stenosis, patient returned the following day after using intravaginal misoprostol.  Endometrial biopsy on 12/22 revealed FIGO grade 2/3 endometrial cancer.   02/27/2021 Imaging   US pelvis Thickened endometrium with possible 14 mm endometrial mass. In the setting of post-menopausal bleeding, endometrial sampling is indicated to exclude carcinoma   03/06/2021 Initial Biopsy   Endometrial biopsy revealed FIGO grade 2/3 endometrial cancer   03/24/2021 Initial Diagnosis   Endometrial carcinoma (HCC)   03/30/2021 Imaging   CT A/P: 1. Abnormal thickening of the endometrium, as seen on prior ultrasound and consistent with recently diagnosed endometrial malignancy. 2. No evidence of lymphadenopathy or metastatic disease in the abdomen or pelvis. 3. Subcapsular hyperenhancing lesion of the lateral inferior right lobe of the liver, hepatic segment VI is only very faintly appreciated on this portal venous phase contrast examination, better characterized as a benign focal nodular hyperplasia by prior MR. 4. Diverticulosis.   04/11/2021 Surgery   Robotic-assisted laparoscopic total hysterectomy with bilateral salpingoophorectomy, SLN biopsy, omental biopsy, cystoscopy   Findings: On EUA, small mobile uterus. On intra-abdominal entry, normal upper abdominal survey.  Normal omentum, small bowel. Large bowels with some filmy adhesions to the left pelvic sidewall and the left adhesions. Filmy adhesions also noted from the sigmoid to the posterior uterus (some of the adhesions biopsied). Uterus 8cm and normal in appearance. Atrophic appearing adnexa. Mapping successful to bilateral SLNs. No obvious adenopathy. Minimal adhesions of the bladder to the LUS. No intra-abdominal or pelvic evidence of disease. Given concentrated appearing urine and low UOP, cystoscopy was performed. Bladder dome intact (bubbles seen). Good efflux noted from bilateral ureteral orifices.    04/11/2021 Pathologic Stage   Stage IA serous uterine cancer No LVI, negative SLNs Omentum negative, washings nondiagnostic (insufficient cellularity) HER2 negative   04/18/2021 Cancer Staging   Staging form: Corpus Uteri - Carcinoma and Carcinosarcoma, AJCC 8th Edition - Pathologic stage from 04/18/2021: FIGO Stage IA (pT1a, pN0, cM0) - Signed by Artis Delay, MD on 04/18/2021 Stage prefix: Initial diagnosis    05/05/2021 Procedure   Successful placement of a RIGHT internal jugular approach power injectable Port-A-Cath.   The tip of the catheter is positioned within the proximal RIGHT atrium. The catheter is ready for immediate use   05/09/2021 -  Chemotherapy   Patient is on Treatment Plan : UTERINE Carboplatin AUC 6 / Paclitaxel q21d       PHYSICAL EXAMINATION: ECOG PERFORMANCE STATUS: 1 - Symptomatic but completely ambulatory  Vitals:   05/30/21 1026  BP: (!) 143/77  Pulse: 95  Resp: 18  Temp: 97.6 F (36.4 C)  SpO2: 100%   Filed Weights   05/30/21 1026  Weight: 154 lb 6.4 oz (70 kg)    GENERAL:alert, no distress and comfortable NEURO: alert & oriented x 3 with fluent speech, no focal motor/sensory deficits  LABORATORY DATA:  I have reviewed the data as listed    Component Value Date/Time   NA 140 05/30/2021 1013   NA 145 (H) 05/06/2019 1021   NA 141 10/29/2016 0742   K  3.9 05/30/2021 1013   K 4.1 10/29/2016 0742   CL 107 05/30/2021 1013   CL 108 (H) 04/28/2012 1008   CO2 25 05/30/2021 1013   CO2 26 10/29/2016 0742   GLUCOSE 124 (H) 05/30/2021 1013   GLUCOSE 95 10/29/2016 0742   GLUCOSE 99 04/28/2012 1008   BUN 17 05/30/2021 1013   BUN 15 05/06/2019 1021   BUN 21.1 10/29/2016 0742   CREATININE 0.60 05/30/2021 1013   CREATININE 0.9 10/29/2016 0742   CALCIUM 9.2 05/30/2021 1013   CALCIUM 9.0 10/29/2016 0742   PROT 7.2 05/30/2021 1013   PROT 6.4 05/06/2019 1021   PROT 6.6 10/29/2016 0742   ALBUMIN 3.9 05/30/2021 1013   ALBUMIN 3.4 (L) 10/29/2016 0742   AST 19 05/30/2021 1013   AST 15 10/29/2016 0742   ALT 15 05/30/2021 1013   ALT 12 10/29/2016 0742   ALKPHOS 77 05/30/2021 1013   ALKPHOS 49 10/29/2016 0742   BILITOT 0.3 05/30/2021 1013   BILITOT 0.32 10/29/2016 0742   GFRNONAA >60 05/30/2021 1013   GFRAA >60 12/18/2019 1114   GFRAA >60 07/15/2019 0847    No results found for: SPEP, UPEP  Lab Results  Component Value Date   WBC 7.4 05/30/2021   NEUTROABS 6.2 05/30/2021   HGB 12.1 05/30/2021   HCT 35.6 (L) 05/30/2021   MCV 89.2 05/30/2021   PLT 290 05/30/2021      Chemistry      Component Value Date/Time   NA 140 05/30/2021 1013   NA 145 (H) 05/06/2019 1021   NA 141 10/29/2016 0742   K 3.9 05/30/2021 1013   K 4.1 10/29/2016 0742   CL 107 05/30/2021 1013   CL 108 (H) 04/28/2012 1008   CO2 25 05/30/2021 1013   CO2 26 10/29/2016 0742   BUN 17 05/30/2021 1013   BUN 15 05/06/2019 1021   BUN 21.1 10/29/2016 0742   CREATININE 0.60 05/30/2021 1013   CREATININE 0.9 10/29/2016 0742      Component Value Date/Time   CALCIUM 9.2 05/30/2021 1013   CALCIUM 9.0 10/29/2016 0742   ALKPHOS 77 05/30/2021 1013   ALKPHOS 49 10/29/2016 0742   AST 19 05/30/2021 1013   AST 15 10/29/2016 0742   ALT 15 05/30/2021 1013   ALT 12 10/29/2016 0742   BILITOT 0.3 05/30/2021 1013   BILITOT 0.32 10/29/2016 0742

## 2021-05-31 NOTE — Assessment & Plan Note (Signed)
She had significant side effects with cycle 1 of treatment which I attributed that to side effects of steroids ?Per previous discussion, I am reducing her premed oral dexamethasone and advised the patient to take additional antiacid medicine as needed ?She finally got prior authorization for approval for G-CSF support but her blood count is completely normal and I do not feel strongly she needs G-CSF support ?She will start radiation therapy soon ?We will monitor closely for additional side effects of treatment ?

## 2021-06-01 ENCOUNTER — Ambulatory Visit: Payer: Medicare Other

## 2021-06-20 ENCOUNTER — Encounter: Payer: Self-pay | Admitting: Hematology and Oncology

## 2021-06-20 ENCOUNTER — Other Ambulatory Visit: Payer: Self-pay

## 2021-06-20 ENCOUNTER — Inpatient Hospital Stay: Payer: Medicare Other

## 2021-06-20 ENCOUNTER — Inpatient Hospital Stay (HOSPITAL_BASED_OUTPATIENT_CLINIC_OR_DEPARTMENT_OTHER): Payer: Medicare Other | Admitting: Hematology and Oncology

## 2021-06-20 DIAGNOSIS — C541 Malignant neoplasm of endometrium: Secondary | ICD-10-CM

## 2021-06-20 DIAGNOSIS — D6481 Anemia due to antineoplastic chemotherapy: Secondary | ICD-10-CM

## 2021-06-20 DIAGNOSIS — T451X5A Adverse effect of antineoplastic and immunosuppressive drugs, initial encounter: Secondary | ICD-10-CM | POA: Diagnosis not present

## 2021-06-20 DIAGNOSIS — G620 Drug-induced polyneuropathy: Secondary | ICD-10-CM | POA: Diagnosis not present

## 2021-06-20 LAB — CMP (CANCER CENTER ONLY)
ALT: 23 U/L (ref 0–44)
AST: 20 U/L (ref 15–41)
Albumin: 3.9 g/dL (ref 3.5–5.0)
Alkaline Phosphatase: 90 U/L (ref 38–126)
Anion gap: 8 (ref 5–15)
BUN: 15 mg/dL (ref 8–23)
CO2: 25 mmol/L (ref 22–32)
Calcium: 9.2 mg/dL (ref 8.9–10.3)
Chloride: 107 mmol/L (ref 98–111)
Creatinine: 0.64 mg/dL (ref 0.44–1.00)
GFR, Estimated: >60 mL/min (ref >60)
Glucose, Bld: 147 mg/dL — ABNORMAL HIGH (ref 70–99)
Potassium: 3.8 mmol/L (ref 3.5–5.1)
Sodium: 140 mmol/L (ref 135–145)
Total Bilirubin: 0.3 mg/dL (ref 0.3–1.2)
Total Protein: 7.2 g/dL (ref 6.5–8.1)

## 2021-06-20 LAB — CBC WITH DIFFERENTIAL (CANCER CENTER ONLY)
Abs Immature Granulocytes: 0.02 10*3/uL (ref 0.00–0.07)
Basophils Absolute: 0 10*3/uL (ref 0.0–0.1)
Basophils Relative: 0 %
Eosinophils Absolute: 0 10*3/uL (ref 0.0–0.5)
Eosinophils Relative: 0 %
HCT: 35.7 % — ABNORMAL LOW (ref 36.0–46.0)
Hemoglobin: 11.8 g/dL — ABNORMAL LOW (ref 12.0–15.0)
Immature Granulocytes: 0 %
Lymphocytes Relative: 14 %
Lymphs Abs: 0.7 10*3/uL (ref 0.7–4.0)
MCH: 29.9 pg (ref 26.0–34.0)
MCHC: 33.1 g/dL (ref 30.0–36.0)
MCV: 90.6 fL (ref 80.0–100.0)
Monocytes Absolute: 0.1 10*3/uL (ref 0.1–1.0)
Monocytes Relative: 1 %
Neutro Abs: 4.2 10*3/uL (ref 1.7–7.7)
Neutrophils Relative %: 85 %
Platelet Count: 190 10*3/uL (ref 150–400)
RBC: 3.94 MIL/uL (ref 3.87–5.11)
RDW: 15.1 % (ref 11.5–15.5)
WBC Count: 4.9 10*3/uL (ref 4.0–10.5)
nRBC: 0 % (ref 0.0–0.2)

## 2021-06-20 MED ORDER — SODIUM CHLORIDE 0.9 % IV SOLN
131.2500 mg/m2 | Freq: Once | INTRAVENOUS | Status: AC
  Administered 2021-06-20: 234 mg via INTRAVENOUS
  Filled 2021-06-20: qty 39

## 2021-06-20 MED ORDER — SODIUM CHLORIDE 0.9 % IV SOLN
10.0000 mg | Freq: Once | INTRAVENOUS | Status: AC
  Administered 2021-06-20: 10 mg via INTRAVENOUS
  Filled 2021-06-20: qty 10

## 2021-06-20 MED ORDER — SODIUM CHLORIDE 0.9 % IV SOLN
508.8000 mg | Freq: Once | INTRAVENOUS | Status: AC
  Administered 2021-06-20: 510 mg via INTRAVENOUS
  Filled 2021-06-20: qty 51

## 2021-06-20 MED ORDER — SODIUM CHLORIDE 0.9% FLUSH
10.0000 mL | Freq: Once | INTRAVENOUS | Status: AC
  Administered 2021-06-20: 10 mL

## 2021-06-20 MED ORDER — SODIUM CHLORIDE 0.9 % IV SOLN: Freq: Once | INTRAVENOUS | Status: AC

## 2021-06-20 MED ORDER — SODIUM CHLORIDE 0.9 % IV SOLN
150.0000 mg | Freq: Once | INTRAVENOUS | Status: AC
  Administered 2021-06-20: 150 mg via INTRAVENOUS
  Filled 2021-06-20: qty 150

## 2021-06-20 MED ORDER — FAMOTIDINE IN NACL 20-0.9 MG/50ML-% IV SOLN
20.0000 mg | Freq: Once | INTRAVENOUS | Status: AC
  Administered 2021-06-20: 20 mg via INTRAVENOUS
  Filled 2021-06-20: qty 50

## 2021-06-20 MED ORDER — DIPHENHYDRAMINE HCL 50 MG/ML IJ SOLN
25.0000 mg | Freq: Once | INTRAMUSCULAR | Status: AC
  Administered 2021-06-20: 25 mg via INTRAVENOUS
  Filled 2021-06-20: qty 1

## 2021-06-20 MED ORDER — PALONOSETRON HCL INJECTION 0.25 MG/5ML
0.2500 mg | Freq: Once | INTRAVENOUS | Status: AC
  Administered 2021-06-20: 0.25 mg via INTRAVENOUS
  Filled 2021-06-20: qty 5

## 2021-06-20 MED ORDER — SENNOSIDES-DOCUSATE SODIUM 8.6-50 MG PO TABS: 2.0000 | ORAL_TABLET | Freq: Every day | ORAL | 0 refills | Status: DC

## 2021-06-20 NOTE — Assessment & Plan Note (Signed)
This is likely due to recent treatment. The patient denies recent history of bleeding such as epistaxis, hematuria or hematochezia. She is asymptomatic from the anemia. I will observe for now.   

## 2021-06-20 NOTE — Assessment & Plan Note (Signed)
She denies significant additional worsening neuropathy ?We will proceed with caution ?We discussed importance of cryotherapy during treatment ?

## 2021-06-20 NOTE — Patient Instructions (Signed)

## 2021-06-20 NOTE — Assessment & Plan Note (Signed)
Overall, she tolerated last cycle of treatment well except for some fatigue, trace neuropathy and some constipation ?We will proceed with treatment with similar dose as before ?I anticipate possibility of slight worsening pancytopenia with exposure to radiation in the future but we will see ?

## 2021-06-20 NOTE — Progress Notes (Signed)
[facility] ?OFFICE PROGRESS NOTE ? ?Patient Care Team: ?[name], MD as PCP - General (Family Medicine) ?[name] Craige Cotta, RN as Oncology Nurse Navigator (Oncology) ? ?ASSESSMENT & PLAN:  ?Endometrial carcinoma ([place]) ?Overall, she tolerated last cycle of treatment well except for some fatigue, trace neuropathy and some constipation ?We will proceed with treatment with similar dose as before ?I anticipate possibility of slight worsening pancytopenia with exposure to radiation in the future but we will see ? ?Peripheral neuropathy due to chemotherapy [facility]) ?She denies significant additional worsening neuropathy ?We will proceed with caution ?We discussed importance of cryotherapy during treatment ? ?Anemia due to antineoplastic chemotherapy ?This is likely due to recent treatment. The patient denies recent history of bleeding such as epistaxis, hematuria or hematochezia. She is asymptomatic from the anemia. I will observe for now.   ? ?No orders of the defined types were placed in this encounter. ? ? ?All questions were answered. The patient knows to call the clinic with any problems, questions or concerns. ?The total time spent in the appointment was 20 minutes encounter with patients including review of chart and various tests results, discussions about plan of care and coordination of care plan ?  ?Heath Lark, MD ?06/20/2021 10:03 AM ? ?INTERVAL HISTORY: ?Please see below for problem oriented charting. ?she returns for treatment follow-up seen prior to cycle 3 of treatment ?She is doing well ?Has some mild fatigue ?Constipation resolved with taking Senokot and prune juice ?Denies worsening peripheral neuropathy ? ?REVIEW OF SYSTEMS:   ?Constitutional: Denies fevers, chills or abnormal weight loss ?Eyes: Denies blurriness of vision ?Ears, nose, mouth, throat, and face: Denies mucositis or sore throat ?Respiratory: Denies cough, dyspnea or wheezes ?Cardiovascular: Denies palpitation, chest  discomfort or lower extremity swelling ?Gastrointestinal:  Denies nausea, heartburn or change in bowel habits ?Skin: Denies abnormal skin rashes ?Lymphatics: Denies new lymphadenopathy or easy bruising ?Behavioral/Psych: Mood is stable, no new changes  ?All other systems were reviewed with the patient and are negative. ? ?I have reviewed the past medical history, past surgical history, social history and family history with the patient and they are unchanged from previous note. ? ?ALLERGIES:  is allergic to augmentin [amoxicillin-pot clavulanate], morphine and related, amoxicillin, asa [aspirin], banana, ceftin [cefuroxime axetil], citrus, lactose intolerance (gi), orange oil, penicillins, cefuroxime, clavulanic acid, tetanus-diphth-acell pertussis, and tape. ? ?MEDICATIONS:  ?Current Outpatient Medications  ?Medication Sig Dispense Refill  ? acetaminophen (TYLENOL) 500 MG tablet Take 1,000 mg by mouth at bedtime as needed (Arthritis pain).    ? cyclobenzaprine (FLEXERIL) 10 MG tablet Take 5-10 mg by mouth at bedtime as needed for muscle spasms.  0  ? cycloSPORINE (RESTASIS) 0.05 % ophthalmic emulsion Place 1 drop into both eyes 2 (two) times daily.     ? dexamethasone (DECADRON) 4 MG tablet Take 2 tabs at the night before chemotherapy, every 3 weeks, by mouth x 6 cycles 36 tablet 6  ? esomeprazole (NEXIUM) 20 MG capsule Take 20 mg by mouth daily.    ? fluocinonide gel (LIDEX) 6.21 % Apply 1 application topically daily as needed (canker sores).     ? fluticasone (FLONASE) 50 MCG/ACT nasal spray Place 2 sprays into both nostrils daily.     ? hyoscyamine (LEVSIN) 0.125 MG tablet Take 0.125 mg by mouth 3 (three) times daily as needed (IBS).    ? Lactobacillus (ACIDOPHILUS PO) Take 5 mg by mouth daily.    ? lidocaine-prilocaine (EMLA) cream Apply to affected area once 30  g 3  ? MELOXICAM PO Take 15 mg by mouth daily as needed (Arthritis pain).    ? Multiple Vitamin (MULTIVITAMIN) capsule Take 1 capsule by mouth in the  morning.     ? Olopatadine HCl 0.2 % SOLN Place 1 drop into both eyes daily as needed (Allergies/Seasonal).    ? ondansetron (ZOFRAN) 8 MG tablet Take 1 tablet (8 mg total) by mouth 2 (two) times daily as needed. Start on the third day after chemotherapy. 30 tablet 1  ? Peppermint Oil (IBGARD PO) Take 180 mg by mouth daily as needed (IBS).    ? Polyethyl Glycol-Propyl Glycol (SYSTANE) 0.4-0.3 % SOLN Place 1 drop into both eyes daily as needed (Dry eye).    ? prochlorperazine (COMPAZINE) 10 MG tablet Take 1 tablet (10 mg total) by mouth every 6 (six) hours as needed (Nausea or vomiting). 30 tablet 1  ? senna-docusate (SENOKOT-S) 8.6-50 MG tablet Take 2 tablets by mouth at bedtime. 60 tablet 0  ? vitamin E 400 UNIT capsule Take 400 Units by mouth daily.     ? ?No current facility-administered medications for this visit.  ? ? ?SUMMARY OF ONCOLOGIC HISTORY: ?Oncology History Overview Note  ?CA-125 on 04/06/21: 10.2 ?High grade serous, Her 2 neg ?Genetics from 04/30/18: VUS ? ? ?MMR IHC intact ?  ?History of lung cancer (Resolved)  ?04/28/2012 Initial Diagnosis  ? Lung cancer, diagnosed in March of 2006 stage II a status post left trisegmentectomy with lymph node dissection followed by 4 cycles of adjuvant chemotherapy ?  ?05/06/2018 Genetic Testing  ? MEN1 c.875C>T and MSH3 c.1366G>A VUS identified on the multicancer gene panel.  The Multi-Gene Panel offered by [facility] includes sequencing and/or deletion duplication testing of the following 84 genes: AIP, ALK, APC, ATM, AXIN2,BAP1,  BARD1, BLM, BMPR1A, BRCA1, BRCA2, BRIP1, CASR, CDC73, CDH1, CDK4, CDKN1B, CDKN1C, CDKN2A (p14ARF), CDKN2A (p16INK4a), CEBPA, CHEK2, CTNNA1, DICER1, DIS3L2, EGFR (c.2369C>T, p.Thr790Met variant only), EPCAM (Deletion/duplication testing only), FH, FLCN, GATA2, GPC3, GREM1 (Promoter region deletion/duplication testing only), HOXB13 (c.251G>A, p.Gly84Glu), HRAS, KIT, MAX, MEN1, MET, MITF (c.952G>A, p.Glu318Lys variant only), MLH1, MSH2, MSH3, MSH6,  MUTYH, NBN, NF1, NF2, NTHL1, PALB2, PDGFRA, PHOX2B, PMS2, POLD1, POLE, POT1, PRKAR1A, PTCH1, PTEN, RAD50, RAD51C, RAD51D, RB1, RECQL4, RET, RUNX1, SDHAF2, SDHA (sequence changes only), SDHB, SDHC, SDHD, SMAD4, SMARCA4, SMARCB1, SMARCE1, STK11, SUFU, TERC, TERT, TMEM127, TP53, TSC1, TSC2, VHL, WRN and WT1.  The report date is May 06, 2018. ?  ?History of ductal carcinoma in situ (DCIS) of breast  ?04/28/2012 Initial Diagnosis  ? 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  ?05/06/2018 Genetic Testing  ? MEN1 c.875C>T and MSH3 c.1366G>A VUS identified on the multicancer gene panel.  The Multi-Gene Panel offered by [facility] includes sequencing and/or deletion duplication testing of the following 84 genes: AIP, ALK, APC, ATM, AXIN2,BAP1,  BARD1, BLM, BMPR1A, BRCA1, BRCA2, BRIP1, CASR, CDC73, CDH1, CDK4, CDKN1B, CDKN1C, CDKN2A (p14ARF), CDKN2A (p16INK4a), CEBPA, CHEK2, CTNNA1, DICER1, DIS3L2, EGFR (c.2369C>T, p.Thr790Met variant only), EPCAM (Deletion/duplication testing only), FH, FLCN, GATA2, GPC3, GREM1 (Promoter region deletion/duplication testing only), HOXB13 (c.251G>A, p.Gly84Glu), HRAS, KIT, MAX, MEN1, MET, MITF (c.952G>A, p.Glu318Lys variant only), MLH1, MSH2, MSH3, MSH6, MUTYH, NBN, NF1, NF2, NTHL1, PALB2, PDGFRA, PHOX2B, PMS2, POLD1, POLE, POT1, PRKAR1A, PTCH1, PTEN, RAD50, RAD51C, RAD51D, RB1, RECQL4, RET, RUNX1, SDHAF2, SDHA (sequence changes only), SDHB, SDHC, SDHD, SMAD4, SMARCA4, SMARCB1, SMARCE1, STK11, SUFU, TERC, TERT, TMEM127, TP53, TSC1, TSC2, VHL, WRN and  WT1.  The report date is May 06, 2018. ?  ?Endometrial carcinoma ([place])  ?01/27/2021 Initial Diagnosis  ? Patient began having vaginal spotting on November 10th.  She had 4 days of spotting which she describes as seeing red when she wiped and having a little brown discharge on her liner.  Bleeding then stopped completely.  She saw her primary care provider and  underwent an ultrasound.  Ultrasound showed thickened endometrium measuring 14 mm.  She then started bleeding again on December 14 and had a week of bleeding with a little bit more red blood this time.  She ultimately

## 2021-06-20 NOTE — Patient Instructions (Signed)
[facility]  Discharge Instructions: ?Thank you for choosing [facility] to provide your oncology and hematology care.  ? ?If you have a lab appointment with the [facility], please go directly to the [facility] and check in at the registration area. ?  ?Wear comfortable clothing and clothing appropriate for easy access to any Portacath or PICC line.  ? ?We strive to give you quality time with your provider. You may need to reschedule your appointment if you arrive late (15 or more minutes).  Arriving late affects you and other patients whose appointments are after yours.  Also, if you miss three or more appointments without notifying the office, you may be dismissed from the clinic at the provider?s discretion.    ?  ?For prescription refill requests, have your pharmacy contact our office and allow 72 hours for refills to be completed.   ? ?Today you received the following chemotherapy and/or immunotherapy agents: Taxol (paclitaxel), carboplatin    ?  ?To help prevent nausea and vomiting after your treatment, we encourage you to take your nausea medication as directed. ? ?BELOW ARE SYMPTOMS THAT SHOULD BE REPORTED IMMEDIATELY: ?*FEVER GREATER THAN 100.4 F (38 ?C) OR HIGHER ?*CHILLS OR SWEATING ?*NAUSEA AND VOMITING THAT IS NOT CONTROLLED WITH YOUR NAUSEA MEDICATION ?*UNUSUAL SHORTNESS OF BREATH ?*UNUSUAL BRUISING OR BLEEDING ?*URINARY PROBLEMS (pain or burning when urinating, or frequent urination) ?*BOWEL PROBLEMS (unusual diarrhea, constipation, pain near the anus) ?TENDERNESS IN MOUTH AND THROAT WITH OR WITHOUT PRESENCE OF ULCERS (sore throat, sores in mouth, or a toothache) ?UNUSUAL RASH, SWELLING OR PAIN  ?UNUSUAL VAGINAL DISCHARGE OR ITCHING  ? ?Items with * indicate a potential emergency and should be followed up as soon as possible or go to the Emergency Department if any problems should occur. ? ?Please show the CHEMOTHERAPY ALERT CARD or IMMUNOTHERAPY  ALERT CARD at check-in to the Emergency Department and triage nurse. ? ?Should you have questions after your visit or need to cancel or reschedule your appointment, please contact [facility]  Dept: [phone]  and follow the prompts.  Office hours are 8:00 a.m. to 4:30 p.m. Monday - Friday. Please note that voicemails left after 4:00 p.m. may not be returned until the following business day.  We are closed weekends and major holidays. You have access to a nurse at all times for urgent questions. Please call the main number to the clinic Dept: [phone] and follow the prompts. ? ? ?For any non-urgent questions, you may also contact your provider using MyChart. We now offer e-Visits for anyone 28 and older to request care online for non-urgent symptoms. For details visit [email or web address]. ?  ?Also download the MyChart app! Go to the app store, search "MyChart", open the app, select [facility], and log in with your MyChart username and password. ? ?Due to Covid, a mask is required upon entering the hospital/clinic. If you do not have a mask, one will be given to you upon arrival. For doctor visits, patients may have 1 support person aged 60 or older with them. For treatment visits, patients cannot have anyone with them due to current Covid guidelines and our immunocompromised population.  ? ?

## 2021-07-04 NOTE — Progress Notes (Incomplete)
[name] is here today for [facility] fitting and treatment. ? ?They did not have external beam radiation therapy.  ? ?Does the patient complain of any of the following: ? ?Pain:*** ?Abdominal bloating: *** ?Diarrhea/Constipation: *** ?Nausea/Vomiting: *** ?Vaginal Discharge: *** ?Blood in Urine or Stool: *** ?Urinary Issues (dysuria/incomplete emptying/ incontinence/ increased frequency/urgency/nocturia): *** ?Post radiation skin changes: *** ? ? ?Additional comments if applicable: *** ? ?*** ? ?

## 2021-07-07 ENCOUNTER — Telehealth: Payer: Self-pay | Admitting: *Deleted

## 2021-07-07 NOTE — Telephone Encounter (Signed)
CALLED PATIENT TO REMIND OF NEW [facility] APPTS. FOR 07-10-21, SPOKE WITH PATIENT AND SHE IS AWARE OF THESE APPTS. ?

## 2021-07-10 ENCOUNTER — Ambulatory Visit: Payer: Medicare Other

## 2021-07-10 ENCOUNTER — Ambulatory Visit: Payer: Medicare Other | Admitting: Radiation Oncology

## 2021-07-11 ENCOUNTER — Other Ambulatory Visit: Payer: Self-pay

## 2021-07-11 ENCOUNTER — Inpatient Hospital Stay: Payer: Medicare Other | Attending: Gynecologic Oncology

## 2021-07-11 ENCOUNTER — Inpatient Hospital Stay (HOSPITAL_BASED_OUTPATIENT_CLINIC_OR_DEPARTMENT_OTHER): Payer: Medicare Other | Admitting: Hematology and Oncology

## 2021-07-11 ENCOUNTER — Inpatient Hospital Stay: Payer: Medicare Other

## 2021-07-11 ENCOUNTER — Encounter: Payer: Self-pay | Admitting: Hematology and Oncology

## 2021-07-11 DIAGNOSIS — Z79621 Long term (current) use of calcineurin inhibitor: Secondary | ICD-10-CM | POA: Diagnosis not present

## 2021-07-11 DIAGNOSIS — K5900 Constipation, unspecified: Secondary | ICD-10-CM | POA: Insufficient documentation

## 2021-07-11 DIAGNOSIS — Z7952 Long term (current) use of systemic steroids: Secondary | ICD-10-CM | POA: Diagnosis not present

## 2021-07-11 DIAGNOSIS — D6481 Anemia due to antineoplastic chemotherapy: Secondary | ICD-10-CM

## 2021-07-11 DIAGNOSIS — Z5111 Encounter for antineoplastic chemotherapy: Secondary | ICD-10-CM | POA: Insufficient documentation

## 2021-07-11 DIAGNOSIS — T451X5A Adverse effect of antineoplastic and immunosuppressive drugs, initial encounter: Secondary | ICD-10-CM | POA: Diagnosis not present

## 2021-07-11 DIAGNOSIS — G620 Drug-induced polyneuropathy: Secondary | ICD-10-CM | POA: Diagnosis not present

## 2021-07-11 DIAGNOSIS — C541 Malignant neoplasm of endometrium: Secondary | ICD-10-CM | POA: Insufficient documentation

## 2021-07-11 DIAGNOSIS — R5383 Other fatigue: Secondary | ICD-10-CM | POA: Diagnosis not present

## 2021-07-11 LAB — CBC WITH DIFFERENTIAL (CANCER CENTER ONLY)
Abs Immature Granulocytes: 0.04 10*3/uL (ref 0.00–0.07)
Basophils Absolute: 0 10*3/uL (ref 0.0–0.1)
Basophils Relative: 0 %
Eosinophils Absolute: 0 10*3/uL (ref 0.0–0.5)
Eosinophils Relative: 0 %
HCT: 33.6 % — ABNORMAL LOW (ref 36.0–46.0)
Hemoglobin: 11.5 g/dL — ABNORMAL LOW (ref 12.0–15.0)
Immature Granulocytes: 1 %
Lymphocytes Relative: 13 %
Lymphs Abs: 0.7 10*3/uL (ref 0.7–4.0)
MCH: 31.3 pg (ref 26.0–34.0)
MCHC: 34.2 g/dL (ref 30.0–36.0)
MCV: 91.6 fL (ref 80.0–100.0)
Monocytes Absolute: 0.2 10*3/uL (ref 0.1–1.0)
Monocytes Relative: 3 %
Neutro Abs: 4.3 10*3/uL (ref 1.7–7.7)
Neutrophils Relative %: 83 %
Platelet Count: 212 10*3/uL (ref 150–400)
RBC: 3.67 MIL/uL — ABNORMAL LOW (ref 3.87–5.11)
RDW: 17.2 % — ABNORMAL HIGH (ref 11.5–15.5)
WBC Count: 5.2 10*3/uL (ref 4.0–10.5)
nRBC: 0 % (ref 0.0–0.2)

## 2021-07-11 LAB — CMP (CANCER CENTER ONLY)
ALT: 24 U/L (ref 0–44)
AST: 24 U/L (ref 15–41)
Albumin: 3.9 g/dL (ref 3.5–5.0)
Alkaline Phosphatase: 89 U/L (ref 38–126)
Anion gap: 9 (ref 5–15)
BUN: 15 mg/dL (ref 8–23)
CO2: 24 mmol/L (ref 22–32)
Calcium: 9.3 mg/dL (ref 8.9–10.3)
Chloride: 107 mmol/L (ref 98–111)
Creatinine: 0.8 mg/dL (ref 0.44–1.00)
GFR, Estimated: >60 mL/min (ref >60)
Glucose, Bld: 130 mg/dL — ABNORMAL HIGH (ref 70–99)
Potassium: 3.8 mmol/L (ref 3.5–5.1)
Sodium: 140 mmol/L (ref 135–145)
Total Bilirubin: 0.3 mg/dL (ref 0.3–1.2)
Total Protein: 7.3 g/dL (ref 6.5–8.1)

## 2021-07-11 MED ORDER — DIPHENHYDRAMINE HCL 50 MG/ML IJ SOLN
25.0000 mg | Freq: Once | INTRAMUSCULAR | Status: AC
  Administered 2021-07-11: 25 mg via INTRAVENOUS
  Filled 2021-07-11: qty 1

## 2021-07-11 MED ORDER — HEPARIN SOD (PORK) LOCK FLUSH 100 UNIT/ML IV SOLN
500.0000 [IU] | Freq: Once | INTRAVENOUS | Status: AC | PRN
  Administered 2021-07-11: 500 [IU]

## 2021-07-11 MED ORDER — SODIUM CHLORIDE 0.9 % IV SOLN
508.8000 mg | Freq: Once | INTRAVENOUS | Status: AC
  Administered 2021-07-11: 510 mg via INTRAVENOUS
  Filled 2021-07-11: qty 51

## 2021-07-11 MED ORDER — PALONOSETRON HCL INJECTION 0.25 MG/5ML
0.2500 mg | Freq: Once | INTRAVENOUS | Status: AC
  Administered 2021-07-11: 0.25 mg via INTRAVENOUS
  Filled 2021-07-11: qty 5

## 2021-07-11 MED ORDER — SODIUM CHLORIDE 0.9 % IV SOLN
150.0000 mg | Freq: Once | INTRAVENOUS | Status: AC
  Administered 2021-07-11: 150 mg via INTRAVENOUS
  Filled 2021-07-11: qty 150

## 2021-07-11 MED ORDER — SODIUM CHLORIDE 0.9 % IV SOLN: Freq: Once | INTRAVENOUS | Status: AC

## 2021-07-11 MED ORDER — FAMOTIDINE IN NACL 20-0.9 MG/50ML-% IV SOLN
20.0000 mg | Freq: Once | INTRAVENOUS | Status: AC
  Administered 2021-07-11: 20 mg via INTRAVENOUS
  Filled 2021-07-11: qty 50

## 2021-07-11 MED ORDER — SODIUM CHLORIDE 0.9% FLUSH
10.0000 mL | Freq: Once | INTRAVENOUS | Status: AC
  Administered 2021-07-11: 10 mL

## 2021-07-11 MED ORDER — SODIUM CHLORIDE 0.9% FLUSH
10.0000 mL | INTRAVENOUS | Status: DC | PRN
  Administered 2021-07-11: 10 mL

## 2021-07-11 MED ORDER — SODIUM CHLORIDE 0.9 % IV SOLN
131.2500 mg/m2 | Freq: Once | INTRAVENOUS | Status: AC
  Administered 2021-07-11: 234 mg via INTRAVENOUS
  Filled 2021-07-11: qty 39

## 2021-07-11 MED ORDER — SODIUM CHLORIDE 0.9 % IV SOLN
10.0000 mg | Freq: Once | INTRAVENOUS | Status: AC
  Administered 2021-07-11: 10 mg via INTRAVENOUS
  Filled 2021-07-11: qty 10

## 2021-07-11 NOTE — Patient Instructions (Signed)
[facility]  Discharge Instructions: ?Thank you for choosing [facility] to provide your oncology and hematology care.  ? ?If you have a lab appointment with the [facility], please go directly to the [facility] and check in at the registration area. ?  ?Wear comfortable clothing and clothing appropriate for easy access to any Portacath or PICC line.  ? ?We strive to give you quality time with your provider. You may need to reschedule your appointment if you arrive late (15 or more minutes).  Arriving late affects you and other patients whose appointments are after yours.  Also, if you miss three or more appointments without notifying the office, you may be dismissed from the clinic at the provider?s discretion.    ?  ?For prescription refill requests, have your pharmacy contact our office and allow 72 hours for refills to be completed.   ? ?Today you received the following chemotherapy and/or immunotherapy agents: Paclitaxel and Carboplatin    ?  ?To help prevent nausea and vomiting after your treatment, we encourage you to take your nausea medication as directed. ? ?BELOW ARE SYMPTOMS THAT SHOULD BE REPORTED IMMEDIATELY: ?*FEVER GREATER THAN 100.4 F (38 ?C) OR HIGHER ?*CHILLS OR SWEATING ?*NAUSEA AND VOMITING THAT IS NOT CONTROLLED WITH YOUR NAUSEA MEDICATION ?*UNUSUAL SHORTNESS OF BREATH ?*UNUSUAL BRUISING OR BLEEDING ?*URINARY PROBLEMS (pain or burning when urinating, or frequent urination) ?*BOWEL PROBLEMS (unusual diarrhea, constipation, pain near the anus) ?TENDERNESS IN MOUTH AND THROAT WITH OR WITHOUT PRESENCE OF ULCERS (sore throat, sores in mouth, or a toothache) ?UNUSUAL RASH, SWELLING OR PAIN  ?UNUSUAL VAGINAL DISCHARGE OR ITCHING  ? ?Items with * indicate a potential emergency and should be followed up as soon as possible or go to the Emergency Department if any problems should occur. ? ?Please show the CHEMOTHERAPY ALERT CARD or IMMUNOTHERAPY ALERT  CARD at check-in to the Emergency Department and triage nurse. ? ?Should you have questions after your visit or need to cancel or reschedule your appointment, please contact [facility]  Dept: [phone]  and follow the prompts.  Office hours are 8:00 a.m. to 4:30 p.m. Monday - Friday. Please note that voicemails left after 4:00 p.m. may not be returned until the following business day.  We are closed weekends and major holidays. You have access to a nurse at all times for urgent questions. Please call the main number to the clinic Dept: [phone] and follow the prompts. ? ? ?For any non-urgent questions, you may also contact your provider using MyChart. We now offer e-Visits for anyone 26 and older to request care online for non-urgent symptoms. For details visit [email or web address]. ?  ?Also download the MyChart app! Go to the app store, search "MyChart", open the app, select [facility], and log in with your MyChart username and password. ? ?Due to Covid, a mask is required upon entering the hospital/clinic. If you do not have a mask, one will be given to you upon arrival. For doctor visits, patients may have 1 support person aged 21 or older with them. For treatment visits, patients cannot have anyone with them due to current Covid guidelines and our immunocompromised population.  ? ?

## 2021-07-11 NOTE — Assessment & Plan Note (Signed)
This is likely due to recent treatment. The patient denies recent history of bleeding such as epistaxis, hematuria or hematochezia. She is asymptomatic from the anemia. I will observe for now.   

## 2021-07-11 NOTE — Progress Notes (Signed)
[facility] ?OFFICE PROGRESS NOTE ? ?Patient Care Team: ?[name], MD as PCP - General (Family Medicine) ?[name], RN as Oncology Nurse Navigator (Oncology) ? ?ASSESSMENT & PLAN:  ?Endometrial carcinoma ([place]) ?Overall, she tolerated last cycle of treatment well except for some fatigue, trace neuropathy and some constipation ?We will proceed with treatment with similar dose as before ?I anticipate possibility of slight worsening pancytopenia with exposure to radiation in the future  ? ?Anemia due to antineoplastic chemotherapy ?This is likely due to recent treatment. The patient denies recent history of bleeding such as epistaxis, hematuria or hematochezia. She is asymptomatic from the anemia. I will observe for now.   ? ?Peripheral neuropathy due to chemotherapy [facility]) ?She denies significant additional worsening neuropathy ?We will proceed with caution ?We discussed importance of cryotherapy during treatment ? ?No orders of the defined types were placed in this encounter. ? ? ?All questions were answered. The patient knows to call the clinic with any problems, questions or concerns. ?The total time spent in the appointment was 20 minutes encounter with patients including review of chart and various tests results, discussions about plan of care and coordination of care plan ?  ?Heath Lark, MD ?07/11/2021 10:51 AM ? ?INTERVAL HISTORY: ?Please see below for problem oriented charting. ?she returns for treatment follow-up prior to cycle 4 of treatment ?She have some trace neuropathy but not worse ?She has some slight fatigue and changes in bowel habits within the first 10 days of treatment ?Has occasional leg swelling ? ?REVIEW OF SYSTEMS:   ?Constitutional: Denies fevers, chills or abnormal weight loss ?Eyes: Denies blurriness of vision ?Ears, nose, mouth, throat, and face: Denies mucositis or sore throat ?Respiratory: Denies cough, dyspnea or wheezes ?Cardiovascular: Denies palpitation, chest  discomfort  ?Gastrointestinal:  Denies nausea, heartburn or change in bowel habits ?Skin: Denies abnormal skin rashes ?Lymphatics: Denies new lymphadenopathy or easy bruising ?Behavioral/Psych: Mood is stable, no new changes  ?All other systems were reviewed with the patient and are negative. ? ?I have reviewed the past medical history, past surgical history, social history and family history with the patient and they are unchanged from previous note. ? ?ALLERGIES:  is allergic to augmentin [amoxicillin-pot clavulanate], morphine and related, amoxicillin, asa [aspirin], banana, ceftin [cefuroxime axetil], citrus, lactose intolerance (gi), orange oil, penicillins, cefuroxime, clavulanic acid, tetanus-diphth-acell pertussis, and tape. ? ?MEDICATIONS:  ?Current Outpatient Medications  ?Medication Sig Dispense Refill  ? acetaminophen (TYLENOL) 500 MG tablet Take 1,000 mg by mouth at bedtime as needed (Arthritis pain).    ? cyclobenzaprine (FLEXERIL) 10 MG tablet Take 5-10 mg by mouth at bedtime as needed for muscle spasms.  0  ? cycloSPORINE (RESTASIS) 0.05 % ophthalmic emulsion Place 1 drop into both eyes 2 (two) times daily.     ? dexamethasone (DECADRON) 4 MG tablet Take 2 tabs at the night before chemotherapy, every 3 weeks, by mouth x 6 cycles 36 tablet 6  ? esomeprazole (NEXIUM) 20 MG capsule Take 20 mg by mouth daily.    ? fluocinonide gel (LIDEX) 4.23 % Apply 1 application topically daily as needed (canker sores).     ? fluticasone (FLONASE) 50 MCG/ACT nasal spray Place 2 sprays into both nostrils daily.     ? hyoscyamine (LEVSIN) 0.125 MG tablet Take 0.125 mg by mouth 3 (three) times daily as needed (IBS).    ? Lactobacillus (ACIDOPHILUS PO) Take 5 mg by mouth daily.    ? lidocaine-prilocaine (EMLA) cream Apply to affected area  once 30 g 3  ? MELOXICAM PO Take 15 mg by mouth daily as needed (Arthritis pain).    ? Multiple Vitamin (MULTIVITAMIN) capsule Take 1 capsule by mouth in the morning.     ? Olopatadine  HCl 0.2 % SOLN Place 1 drop into both eyes daily as needed (Allergies/Seasonal).    ? ondansetron (ZOFRAN) 8 MG tablet Take 1 tablet (8 mg total) by mouth 2 (two) times daily as needed. Start on the third day after chemotherapy. 30 tablet 1  ? Peppermint Oil (IBGARD PO) Take 180 mg by mouth daily as needed (IBS).    ? Polyethyl Glycol-Propyl Glycol (SYSTANE) 0.4-0.3 % SOLN Place 1 drop into both eyes daily as needed (Dry eye).    ? prochlorperazine (COMPAZINE) 10 MG tablet Take 1 tablet (10 mg total) by mouth every 6 (six) hours as needed (Nausea or vomiting). 30 tablet 1  ? senna-docusate (SENOKOT-S) 8.6-50 MG tablet Take 2 tablets by mouth at bedtime. 60 tablet 0  ? vitamin E 400 UNIT capsule Take 400 Units by mouth daily.     ? ?No current facility-administered medications for this visit.  ? ? ?SUMMARY OF ONCOLOGIC HISTORY: ?Oncology History Overview Note  ?CA-125 on 04/06/21: 10.2 ?High grade serous, Her 2 neg ?Genetics from 04/30/18: VUS ? ? ?MMR IHC intact ?  ?History of lung cancer (Resolved)  ?04/28/2012 Initial Diagnosis  ? Lung cancer, diagnosed in March of 2006 stage II a status post left trisegmentectomy with lymph node dissection followed by 4 cycles of adjuvant chemotherapy ? ?  ?05/06/2018 Genetic Testing  ? MEN1 c.875C>T and MSH3 c.1366G>A VUS identified on the multicancer gene panel.  The Multi-Gene Panel offered by [facility] includes sequencing and/or deletion duplication testing of the following 84 genes: AIP, ALK, APC, ATM, AXIN2,BAP1,  BARD1, BLM, BMPR1A, BRCA1, BRCA2, BRIP1, CASR, CDC73, CDH1, CDK4, CDKN1B, CDKN1C, CDKN2A (p14ARF), CDKN2A (p16INK4a), CEBPA, CHEK2, CTNNA1, DICER1, DIS3L2, EGFR (c.2369C>T, p.Thr790Met variant only), EPCAM (Deletion/duplication testing only), FH, FLCN, GATA2, GPC3, GREM1 (Promoter region deletion/duplication testing only), HOXB13 (c.251G>A, p.Gly84Glu), HRAS, KIT, MAX, MEN1, MET, MITF (c.952G>A, p.Glu318Lys variant only), MLH1, MSH2, MSH3, MSH6, MUTYH, NBN, NF1, NF2,  NTHL1, PALB2, PDGFRA, PHOX2B, PMS2, POLD1, POLE, POT1, PRKAR1A, PTCH1, PTEN, RAD50, RAD51C, RAD51D, RB1, RECQL4, RET, RUNX1, SDHAF2, SDHA (sequence changes only), SDHB, SDHC, SDHD, SMAD4, SMARCA4, SMARCB1, SMARCE1, STK11, SUFU, TERC, TERT, TMEM127, TP53, TSC1, TSC2, VHL, WRN and WT1.  The report date is May 06, 2018. ? ?  ?History of ductal carcinoma in situ (DCIS) of breast  ?04/28/2012 Initial Diagnosis  ? 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 ?  ?05/06/2018 Genetic Testing  ? MEN1 c.875C>T and MSH3 c.1366G>A VUS identified on the multicancer gene panel.  The Multi-Gene Panel offered by [facility] includes sequencing and/or deletion duplication testing of the following 84 genes: AIP, ALK, APC, ATM, AXIN2,BAP1,  BARD1, BLM, BMPR1A, BRCA1, BRCA2, BRIP1, CASR, CDC73, CDH1, CDK4, CDKN1B, CDKN1C, CDKN2A (p14ARF), CDKN2A (p16INK4a), CEBPA, CHEK2, CTNNA1, DICER1, DIS3L2, EGFR (c.2369C>T, p.Thr790Met variant only), EPCAM (Deletion/duplication testing only), FH, FLCN, GATA2, GPC3, GREM1 (Promoter region deletion/duplication testing only), HOXB13 (c.251G>A, p.Gly84Glu), HRAS, KIT, MAX, MEN1, MET, MITF (c.952G>A, p.Glu318Lys variant only), MLH1, MSH2, MSH3, MSH6, MUTYH, NBN, NF1, NF2, NTHL1, PALB2, PDGFRA, PHOX2B, PMS2, POLD1, POLE, POT1, PRKAR1A, PTCH1, PTEN, RAD50, RAD51C, RAD51D, RB1, RECQL4, RET, RUNX1, SDHAF2, SDHA (sequence changes only), SDHB, SDHC, SDHD, SMAD4, SMARCA4, SMARCB1, SMARCE1, STK11, SUFU, TERC, TERT, TMEM127, TP53,  TSC1, TSC2, VHL, WRN and WT1.  The report date is May 06, 2018. ? ?  ?Endometrial carcinoma ([place])  ?01/27/2021 Initial Diagnosis  ? Patient began having vaginal spotting on November 10th.  She had 4 days of spotting which she describes as seeing red when she wiped and having a little brown discharge on her liner.  Bleeding then stopped completely.  She saw her primary care provider and underwent an  ultrasound.  Ultrasound showed thickened endometrium measuring 14 mm.  She then started bleeding again on December 14 and had a week of bleeding with a little bit more red blood this time.  She ultimately saw OB/GYN

## 2021-07-11 NOTE — Assessment & Plan Note (Signed)
Overall, she tolerated last cycle of treatment well except for some fatigue, trace neuropathy and some constipation ?We will proceed with treatment with similar dose as before ?I anticipate possibility of slight worsening pancytopenia with exposure to radiation in the future  ?

## 2021-07-11 NOTE — Assessment & Plan Note (Signed)
She denies significant additional worsening neuropathy ?We will proceed with caution ?We discussed importance of cryotherapy during treatment ?

## 2021-07-17 ENCOUNTER — Ambulatory Visit: Payer: Medicare Other | Admitting: Radiation Oncology

## 2021-07-17 NOTE — Progress Notes (Incomplete)
[name] is here today for HDR [facility] fitting and treatment. ? ?They did not have external beam radiation therapy.  ? ?Does the patient complain of any of the following: ? ?Pain:*** ?Abdominal bloating: *** ?Diarrhea/Constipation: *** ?Nausea/Vomiting: *** ?Vaginal Discharge: *** ?Blood in Urine or Stool: *** ?Urinary Issues (dysuria/incomplete emptying/ incontinence/ increased frequency/urgency/nocturia): *** ?Post radiation skin changes: *** ? ? ?Additional comments if applicable: *** ? ?*** ? ?

## 2021-07-24 ENCOUNTER — Other Ambulatory Visit: Payer: Medicare Other

## 2021-07-24 ENCOUNTER — Ambulatory Visit: Payer: Medicare Other | Admitting: Radiation Oncology

## 2021-07-25 ENCOUNTER — Ambulatory Visit: Payer: Medicare Other | Admitting: Internal Medicine

## 2021-07-25 ENCOUNTER — Other Ambulatory Visit: Payer: Medicare Other

## 2021-07-25 ENCOUNTER — Telehealth: Payer: Self-pay | Admitting: *Deleted

## 2021-07-25 NOTE — Telephone Encounter (Signed)
CALLED PATIENT TO REMIND OF NEW HDR [place] FOR 07-26-21- ARRIVAL TIME- 7:45 AM @ [place], SPOKE WITH PATIENT AND SHE IS AWARE OF THESE APPTS. ?

## 2021-07-25 NOTE — Progress Notes (Signed)
?Radiation Oncology         [phone] ?________________________________ ? ?Name: [name] MRN: [id number]  ?Date: 07/26/2021  DOB: [birth year] ? ?Vaginal Brachytherapy Procedure Note ? ?CC: [name], MD [name], MD ? ?  ICD-10-CM   ?1. Endometrial carcinoma ([place])  C54.1   ?  ? ? ?Diagnosis: The encounter diagnosis was Endometrial carcinoma ([place]). ?  ?FIGO Stage IA (pT1a, pN0, cM0) Endometrial Cancer, high-grade serous ?  ? ? ?Narrative: She returns today for vaginal cylinder fitting.  ? ?Since she was last seen for consultation on 04/26/21, the patient followed up with [name] on 05/05/21. During which time, the patient reported minimal discharge since surgery, and denied any heavy bleeding. Pelvic exam performed during this visit showed no abnormalities. The patient will return to [name] following completion of adjuvant therapy.  ? ?The patient began chemotherapy consisting of taxol and carboplatin on 05/09/21 under the care of Dr. Alvy Bimler. Her first infusion was complicated by a strange sensation in her throat. Treatment was momentarily discontinued and then resumed 30 minutes later without difficulty. Per Dr. Alvy Bimler, her sudden discomfort was likely due to oral steroids (dexamethasone), which Dr. Alvy Bimler reduced the dose of for her next infusion. Chemo related toxicities encountered by the patient through out the course of treatment included chemo induced anemia, mild fatigue, trace neuropathy, and some constipation. Otherwise, the patient has been tolerating systemic treatment relatively well.  ? ?She reports only 2 more cycles remaining for her adjuvant chemotherapy. ? ?She denies any pelvic pain urination difficulties or bowel complaints.  She did have some constipation with her chemotherapy.  She denies any vaginal bleeding. ? ?ALLERGIES: is allergic to augmentin [amoxicillin-pot clavulanate], morphine and related, amoxicillin, asa [aspirin], banana, ceftin [cefuroxime axetil],  citrus, lactose intolerance (gi), orange oil, penicillins, cefuroxime, clavulanic acid, tetanus-diphth-acell pertussis, and tape. ? ?Meds: ?Current Outpatient Medications  ?Medication Sig Dispense Refill  ? acetaminophen (TYLENOL) 500 MG tablet Take 1,000 mg by mouth at bedtime as needed (Arthritis pain).    ? cyclobenzaprine (FLEXERIL) 10 MG tablet Take 5-10 mg by mouth at bedtime as needed for muscle spasms. (Patient not taking: Reported on 07/26/2021)  0  ? cycloSPORINE (RESTASIS) 0.05 % ophthalmic emulsion Place 1 drop into both eyes 2 (two) times daily.     ? dexamethasone (DECADRON) 4 MG tablet Take 2 tabs at the night before chemotherapy, every 3 weeks, by mouth x 6 cycles 36 tablet 6  ? esomeprazole (NEXIUM) 20 MG capsule Take 20 mg by mouth daily.    ? fluocinonide gel (LIDEX) 5.99 % Apply 1 application topically daily as needed (canker sores).     ? fluticasone (FLONASE) 50 MCG/ACT nasal spray Place 2 sprays into both nostrils daily.     ? hyoscyamine (LEVSIN) 0.125 MG tablet Take 0.125 mg by mouth 3 (three) times daily as needed (IBS). (Patient not taking: Reported on 07/26/2021)    ? Lactobacillus (ACIDOPHILUS PO) Take 5 mg by mouth daily.    ? lidocaine-prilocaine (EMLA) cream Apply to affected area once 30 g 3  ? MELOXICAM PO Take 15 mg by mouth daily as needed (Arthritis pain). (Patient not taking: Reported on 07/26/2021)    ? Multiple Vitamin (MULTIVITAMIN) capsule Take 1 capsule by mouth in the morning.     ? Olopatadine HCl 0.2 % SOLN Place 1 drop into both eyes daily as needed (Allergies/Seasonal).    ? ondansetron (ZOFRAN) 8 MG tablet Take 1 tablet (8 mg total) by mouth  2 (two) times daily as needed. Start on the third day after chemotherapy. (Patient not taking: Reported on 07/26/2021) 30 tablet 1  ? Peppermint Oil (IBGARD PO) Take 180 mg by mouth daily as needed (IBS). (Patient not taking: Reported on 07/26/2021)    ? Polyethyl Glycol-Propyl Glycol (SYSTANE) 0.4-0.3 % SOLN Place 1 drop into both eyes  daily as needed (Dry eye).    ? prochlorperazine (COMPAZINE) 10 MG tablet Take 1 tablet (10 mg total) by mouth every 6 (six) hours as needed (Nausea or vomiting). (Patient not taking: Reported on 07/26/2021) 30 tablet 1  ? senna-docusate (SENOKOT-S) 8.6-50 MG tablet Take 2 tablets by mouth at bedtime. 60 tablet 0  ? vitamin E 400 UNIT capsule Take 400 Units by mouth daily.     ? ?No current facility-administered medications for this encounter.  ? ? ?Physical Findings: ?The patient is in no acute distress. Patient is alert and oriented.  ? ?No palpable cervical, supraclavicular or axillary lymphoadenopathy. The heart has a regular rate and rhythm. The lungs are clear to auscultation. Abdomen soft and non-tender. ? ?On pelvic examination the external genitalia were unremarkable. A speculum exam was performed. Vaginal cuff intact, no mucosal lesions. On bimanual exam there were no pelvic masses appreciated.  ? ?Lab Findings: ?Lab Results  ?Component Value Date  ? WBC 5.2 07/11/2021  ? HGB 11.5 (L) 07/11/2021  ? HCT 33.6 (L) 07/11/2021  ? MCV 91.6 07/11/2021  ? PLT 212 07/11/2021  ? ? ?Radiographic Findings: ?No results found. ? ?Impression: FIGO Stage IA (pT1a, pN0, cM0) Endometrial Cancer, high-grade serous ? ?Patient was fitted for a vaginal cylinder. The patient will be treated with a 3.0 cm diameter cylinder with a treatment length of 3.0 cm. This distended the vaginal vault without undue discomfort. The patient tolerated the procedure well. ? ?The patient was successfully fitted for a vaginal cylinder. The patient is appropriate to begin vaginal brachytherapy.  ? ?Plan: The patient will proceed with CT simulation later today.  She will receive her first brachytherapy procedure tomorrow.  Anticipate 5 high-dose-rate treatments directed at the proximal vagina. ? ?_______________________________ ? ? ?[name], PhD, MD ? ?This document serves as a record of services personally performed by Gery Pray, MD. It  was created on his behalf by Roney Mans, a trained medical scribe. The creation of this record is based on the scribe's personal observations and the provider's statements to them. This document has been checked and approved by the attending provider. ? ? ?

## 2021-07-26 ENCOUNTER — Ambulatory Visit
Admission: RE | Admit: 2021-07-26 | Discharge: 2021-07-26 | Disposition: A | Discharge status: Home / Self Care | Payer: Medicare Other | Source: Ambulatory Visit | Attending: Radiation Oncology | Admitting: Radiation Oncology

## 2021-07-26 VITALS — BP 143/79 | HR 90 | Temp 97.3°F | Resp 16 | Ht 63.0 in | Wt 157.2 lb

## 2021-07-26 DIAGNOSIS — R5383 Other fatigue: Secondary | ICD-10-CM | POA: Insufficient documentation

## 2021-07-26 DIAGNOSIS — G620 Drug-induced polyneuropathy: Secondary | ICD-10-CM | POA: Diagnosis not present

## 2021-07-26 DIAGNOSIS — Z510 Encounter for antineoplastic radiation therapy: Secondary | ICD-10-CM | POA: Diagnosis present

## 2021-07-26 DIAGNOSIS — C541 Malignant neoplasm of endometrium: Secondary | ICD-10-CM

## 2021-07-26 DIAGNOSIS — Z7952 Long term (current) use of systemic steroids: Secondary | ICD-10-CM | POA: Diagnosis not present

## 2021-07-26 DIAGNOSIS — Z79899 Other long term (current) drug therapy: Secondary | ICD-10-CM | POA: Diagnosis not present

## 2021-07-26 DIAGNOSIS — Z79621 Long term (current) use of calcineurin inhibitor: Secondary | ICD-10-CM | POA: Insufficient documentation

## 2021-07-26 DIAGNOSIS — Z791 Long term (current) use of non-steroidal anti-inflammatories (NSAID): Secondary | ICD-10-CM | POA: Insufficient documentation

## 2021-07-26 DIAGNOSIS — R9389 Abnormal findings on diagnostic imaging of other specified body structures: Secondary | ICD-10-CM | POA: Diagnosis not present

## 2021-07-26 DIAGNOSIS — D6481 Anemia due to antineoplastic chemotherapy: Secondary | ICD-10-CM | POA: Diagnosis not present

## 2021-07-26 DIAGNOSIS — Z5111 Encounter for antineoplastic chemotherapy: Secondary | ICD-10-CM | POA: Diagnosis not present

## 2021-07-26 NOTE — Progress Notes (Signed)
[name] is here today for HDR [facility] fitting and treatment. ? ?They did not have external beam radiation therapy.  ? ?Does the patient complain of any of the following: ? ?Pain:denies ?Abdominal bloating: denies ?Diarrhea/Constipation: constipation following chemo ?Nausea/Vomiting: denies ?Vaginal Discharge: denies ?Blood in Urine or Stool: denies ?Urinary Issues (dysuria/incomplete emptying/ incontinence/ increased frequency/urgency/nocturia): frequency, occasional urgency, nocturia x3-5 ?Post radiation skin changes: n/a ? ? ?Additional comments if applicable: nothing of note ? ?Vitals:  ? 07/26/21 0825  ?BP: (!) 143/79  ?Pulse: 90  ?Resp: 16  ?Temp: (!) 97.3 ?F (36.3 ?C)  ?SpO2: 100%  ?Weight: 157 lb 3.2 oz (71.3 kg)  ?Height: 5\' 3"  (1.6 m)  ? ? ? ?

## 2021-07-26 NOTE — Progress Notes (Signed)
?  Radiation Oncology         (336) [phone] ?________________________________ ? ?Name: [name] MRN: [id number]  ?Date: 07/27/2021  DOB: [birth year] ? ?CC: [name], MD  [name], MD ? ?HDR BRACHYTHERAPY NOTE ? ?DIAGNOSIS: The encounter diagnosis was Endometrial carcinoma ([place]). ?  ?FIGO Stage IA (pT1a, pN0, cM0) Endometrial Cancer, high-grade serous ?  ?Simple treatment device note: ?Patient had construction of her custom vaginal cylinder. She will be treated with a 3.0 cm diameter segmented cylinder. This conforms to her anatomy without undue discomfort. ? ?Vaginal brachytherapy procedure node: ?The patient was brought to the [place] suite. Identity was confirmed. All relevant records and images related to the planned course of therapy were reviewed. The patient freely provided informed written consent to proceed with treatment after reviewing the details related to the planned course of therapy. The consent form was witnessed and verified by the simulation staff. Then, the patient was set-up in a stable reproducible supine position for radiation therapy. Pelvic exam revealed the vaginal cuff to be intact . The patient's custom vaginal cylinder was placed in the proximal vagina. This was affixed to the CT/MR stabilization plate to prevent slippage. Patient tolerated the placement well. ? ?Verification simulation note:  ?A fiducial marker was placed within the vaginal cylinder. An AP and lateral film was then obtained through the pelvis area. This documented accurate position of the vaginal cylinder for treatment. ? ?HDR BRACHYTHERAPY TREATMENT  ?The remote afterloading device was affixed to the vaginal cylinder by catheter. Patient then proceeded to undergo her first high-dose-rate treatment directed at the proximal vagina. The patient was prescribed a dose of 6.0 gray to be delivered to the mucosal surface. Treatment length was 3.0 cm. Patient was treated with 1 channel using 7 dwell positions.  Treatment time was 205.4 seconds. Iridium 192 was the high-dose-rate source for treatment. The patient tolerated the treatment well. After completion of her therapy, a radiation survey was performed documenting return of the iridium source into the [employer] safe. ?  ?PLAN: The patient will return next week for her second high-dose-rate treatment. ?________________________________  ?----------------------------------- ? ?[name], PhD, MD ? ?This document serves as a record of services personally performed by Gery Pray, MD. It was created on his behalf by Roney Mans, a trained medical scribe. The creation of this record is based on the scribe's personal observations and the provider's statements to them. This document has been checked and approved by the attending provider. ? ? ?

## 2021-07-26 NOTE — Progress Notes (Signed)
See MD note for nursing evaluation. °

## 2021-07-27 ENCOUNTER — Other Ambulatory Visit: Payer: Self-pay

## 2021-07-27 ENCOUNTER — Ambulatory Visit
Admission: RE | Admit: 2021-07-27 | Discharge: 2021-07-27 | Disposition: A | Discharge status: Home / Self Care | Payer: Medicare Other | Source: Ambulatory Visit | Attending: Radiation Oncology | Admitting: Radiation Oncology

## 2021-07-27 DIAGNOSIS — Z510 Encounter for antineoplastic radiation therapy: Secondary | ICD-10-CM | POA: Diagnosis not present

## 2021-07-27 DIAGNOSIS — C541 Malignant neoplasm of endometrium: Secondary | ICD-10-CM

## 2021-07-27 LAB — RAD ONC ARIA SESSION SUMMARY
Course Elapsed Days: 0
Plan Fractions Treated to Date: 1
Plan Prescribed Dose Per Fraction: 6 Gy
Plan Total Fractions Prescribed: 5
Plan Total Prescribed Dose: 30 Gy
Reference Point Dosage Given to Date: 6 Gy
Reference Point Session Dosage Given: 6 Gy
Session Number: 1

## 2021-07-28 ENCOUNTER — Telehealth: Payer: Self-pay | Admitting: *Deleted

## 2021-07-28 NOTE — Telephone Encounter (Signed)
Called patient to remind of HDR Tx. For 07-31-21 @ 2 pm, spoke with patient and she is aware of this tx. ?

## 2021-07-30 NOTE — Progress Notes (Signed)
?  Radiation Oncology         (336) [phone] ?________________________________ ? ?Name: [name] MRN: [id number]  ?Date: 07/31/2021  DOB: December 02, 1950 ? ?CC: [name], MD  [name], MD ? ?HDR BRACHYTHERAPY NOTE ? ?DIAGNOSIS: The encounter diagnosis was Endometrial carcinoma ([place]). ?  ?FIGO Stage IA (pT1a, pN0, cM0) Endometrial Cancer, high-grade serous ?  ?Simple treatment device note: ?Patient had construction of her custom vaginal cylinder. She will be treated with a 3.0 cm diameter segmented cylinder. This conforms to her anatomy without undue discomfort. ? ?Vaginal brachytherapy procedure node: ?The patient was brought to the [place] suite. Identity was confirmed. All relevant records and images related to the planned course of therapy were reviewed. The patient freely provided informed written consent to proceed with treatment after reviewing the details related to the planned course of therapy. The consent form was witnessed and verified by the simulation staff. Then, the patient was set-up in a stable reproducible supine position for radiation therapy. Pelvic exam revealed the vaginal cuff to be intact . The patient's custom vaginal cylinder was placed in the proximal vagina. This was affixed to the CT/MR stabilization plate to prevent slippage. Patient tolerated the placement well. ? ?Verification simulation note:  ?A fiducial marker was placed within the vaginal cylinder. An AP and lateral film was then obtained through the pelvis area. This documented accurate position of the vaginal cylinder for treatment. ? ?HDR BRACHYTHERAPY TREATMENT  ?The remote afterloading device was affixed to the vaginal cylinder by catheter. Patient then proceeded to undergo her second high-dose-rate treatment directed at the proximal vagina. The patient was prescribed a dose of 6.0 gray to be delivered to the mucosal surface. Treatment length was 3.0 cm. Patient was treated with 1 channel using 7 dwell positions.  Treatment time was 213.1 seconds. Iridium 192 was the high-dose-rate source for treatment. The patient tolerated the treatment well. After completion of her therapy, a radiation survey was performed documenting return of the iridium source into the [employer] safe. ?  ?PLAN: The patient will return next week for her third high-dose-rate treatment. ?________________________________  ?----------------------------------- ? ?[name], PhD, MD ? ?This document serves as a record of services personally performed by Gery Pray, MD. It was created on his behalf by Roney Mans, a trained medical scribe. The creation of this record is based on the scribe's personal observations and the provider's statements to them. This document has been checked and approved by the attending provider. ? ? ?

## 2021-07-31 ENCOUNTER — Ambulatory Visit
Admission: RE | Admit: 2021-07-31 | Discharge: 2021-07-31 | Disposition: A | Discharge status: Home / Self Care | Payer: Medicare Other | Source: Ambulatory Visit | Attending: Radiation Oncology | Admitting: Radiation Oncology

## 2021-07-31 ENCOUNTER — Inpatient Hospital Stay: Payer: Medicare Other

## 2021-07-31 ENCOUNTER — Other Ambulatory Visit: Payer: Self-pay

## 2021-07-31 DIAGNOSIS — R9389 Abnormal findings on diagnostic imaging of other specified body structures: Secondary | ICD-10-CM | POA: Insufficient documentation

## 2021-07-31 DIAGNOSIS — C541 Malignant neoplasm of endometrium: Secondary | ICD-10-CM | POA: Insufficient documentation

## 2021-07-31 DIAGNOSIS — Z79621 Long term (current) use of calcineurin inhibitor: Secondary | ICD-10-CM | POA: Insufficient documentation

## 2021-07-31 DIAGNOSIS — Z7952 Long term (current) use of systemic steroids: Secondary | ICD-10-CM | POA: Insufficient documentation

## 2021-07-31 DIAGNOSIS — Z5111 Encounter for antineoplastic chemotherapy: Secondary | ICD-10-CM | POA: Insufficient documentation

## 2021-07-31 DIAGNOSIS — Z79899 Other long term (current) drug therapy: Secondary | ICD-10-CM | POA: Insufficient documentation

## 2021-07-31 DIAGNOSIS — G620 Drug-induced polyneuropathy: Secondary | ICD-10-CM | POA: Insufficient documentation

## 2021-07-31 DIAGNOSIS — D6481 Anemia due to antineoplastic chemotherapy: Secondary | ICD-10-CM | POA: Insufficient documentation

## 2021-07-31 DIAGNOSIS — Z510 Encounter for antineoplastic radiation therapy: Secondary | ICD-10-CM | POA: Diagnosis not present

## 2021-07-31 DIAGNOSIS — G629 Polyneuropathy, unspecified: Secondary | ICD-10-CM | POA: Insufficient documentation

## 2021-07-31 DIAGNOSIS — R5383 Other fatigue: Secondary | ICD-10-CM | POA: Insufficient documentation

## 2021-07-31 LAB — CBC WITH DIFFERENTIAL (CANCER CENTER ONLY)
Abs Immature Granulocytes: 0.02 10*3/uL (ref 0.00–0.07)
Basophils Absolute: 0 10*3/uL (ref 0.0–0.1)
Basophils Relative: 0 %
Eosinophils Absolute: 0 10*3/uL (ref 0.0–0.5)
Eosinophils Relative: 0 %
HCT: 32.6 % — ABNORMAL LOW (ref 36.0–46.0)
Hemoglobin: 11.1 g/dL — ABNORMAL LOW (ref 12.0–15.0)
Immature Granulocytes: 0 %
Lymphocytes Relative: 6 %
Lymphs Abs: 0.4 10*3/uL — ABNORMAL LOW (ref 0.7–4.0)
MCH: 32.1 pg (ref 26.0–34.0)
MCHC: 34 g/dL (ref 30.0–36.0)
MCV: 94.2 fL (ref 80.0–100.0)
Monocytes Absolute: 0.2 10*3/uL (ref 0.1–1.0)
Monocytes Relative: 4 %
Neutro Abs: 5.3 10*3/uL (ref 1.7–7.7)
Neutrophils Relative %: 90 %
Platelet Count: 175 10*3/uL (ref 150–400)
RBC: 3.46 MIL/uL — ABNORMAL LOW (ref 3.87–5.11)
RDW: 18.6 % — ABNORMAL HIGH (ref 11.5–15.5)
WBC Count: 5.9 10*3/uL (ref 4.0–10.5)
nRBC: 0 % (ref 0.0–0.2)

## 2021-07-31 LAB — CMP (CANCER CENTER ONLY)
ALT: 17 U/L (ref 0–44)
AST: 19 U/L (ref 15–41)
Albumin: 3.8 g/dL (ref 3.5–5.0)
Alkaline Phosphatase: 90 U/L (ref 38–126)
Anion gap: 5 (ref 5–15)
BUN: 17 mg/dL (ref 8–23)
CO2: 27 mmol/L (ref 22–32)
Calcium: 8.8 mg/dL — ABNORMAL LOW (ref 8.9–10.3)
Chloride: 106 mmol/L (ref 98–111)
Creatinine: 0.64 mg/dL (ref 0.44–1.00)
GFR, Estimated: >60 mL/min (ref >60)
Glucose, Bld: 94 mg/dL (ref 70–99)
Potassium: 4 mmol/L (ref 3.5–5.1)
Sodium: 138 mmol/L (ref 135–145)
Total Bilirubin: 0.4 mg/dL (ref 0.3–1.2)
Total Protein: 7.1 g/dL (ref 6.5–8.1)

## 2021-07-31 LAB — RAD ONC ARIA SESSION SUMMARY
Course Elapsed Days: 4
Plan Fractions Treated to Date: 2
Plan Prescribed Dose Per Fraction: 6 Gy
Plan Total Fractions Prescribed: 5
Plan Total Prescribed Dose: 30 Gy
Reference Point Dosage Given to Date: 12 Gy
Reference Point Session Dosage Given: 6 Gy
Session Number: 2

## 2021-07-31 MED ORDER — SODIUM CHLORIDE 0.9% FLUSH
10.0000 mL | Freq: Once | INTRAVENOUS | Status: AC
  Administered 2021-07-31: 10 mL

## 2021-07-31 MED ORDER — HEPARIN SOD (PORK) LOCK FLUSH 100 UNIT/ML IV SOLN
500.0000 [IU] | Freq: Once | INTRAVENOUS | Status: AC
  Administered 2021-07-31: 500 [IU]

## 2021-08-01 ENCOUNTER — Ambulatory Visit: Payer: Medicare Other | Admitting: Internal Medicine

## 2021-08-01 ENCOUNTER — Other Ambulatory Visit: Payer: Self-pay

## 2021-08-01 ENCOUNTER — Encounter: Payer: Self-pay | Admitting: Hematology and Oncology

## 2021-08-01 ENCOUNTER — Inpatient Hospital Stay (HOSPITAL_BASED_OUTPATIENT_CLINIC_OR_DEPARTMENT_OTHER): Payer: Medicare Other | Admitting: Hematology and Oncology

## 2021-08-01 ENCOUNTER — Inpatient Hospital Stay: Payer: Medicare Other

## 2021-08-01 VITALS — HR 99

## 2021-08-01 DIAGNOSIS — C541 Malignant neoplasm of endometrium: Secondary | ICD-10-CM

## 2021-08-01 DIAGNOSIS — D6481 Anemia due to antineoplastic chemotherapy: Secondary | ICD-10-CM

## 2021-08-01 DIAGNOSIS — Z510 Encounter for antineoplastic radiation therapy: Secondary | ICD-10-CM | POA: Diagnosis not present

## 2021-08-01 DIAGNOSIS — G620 Drug-induced polyneuropathy: Secondary | ICD-10-CM

## 2021-08-01 DIAGNOSIS — T451X5A Adverse effect of antineoplastic and immunosuppressive drugs, initial encounter: Secondary | ICD-10-CM | POA: Diagnosis not present

## 2021-08-01 MED ORDER — DIPHENHYDRAMINE HCL 50 MG/ML IJ SOLN
25.0000 mg | Freq: Once | INTRAMUSCULAR | Status: AC
  Administered 2021-08-01: 25 mg via INTRAVENOUS
  Filled 2021-08-01: qty 1

## 2021-08-01 MED ORDER — SODIUM CHLORIDE 0.9 % IV SOLN: Freq: Once | INTRAVENOUS | Status: AC

## 2021-08-01 MED ORDER — SODIUM CHLORIDE 0.9 % IV SOLN
10.0000 mg | Freq: Once | INTRAVENOUS | Status: AC
  Administered 2021-08-01: 10 mg via INTRAVENOUS
  Filled 2021-08-01: qty 10

## 2021-08-01 MED ORDER — SODIUM CHLORIDE 0.9 % IV SOLN
150.0000 mg | Freq: Once | INTRAVENOUS | Status: AC
  Administered 2021-08-01: 150 mg via INTRAVENOUS
  Filled 2021-08-01: qty 150

## 2021-08-01 MED ORDER — SODIUM CHLORIDE 0.9% FLUSH
10.0000 mL | INTRAVENOUS | Status: DC | PRN
  Administered 2021-08-01: 10 mL

## 2021-08-01 MED ORDER — PALONOSETRON HCL INJECTION 0.25 MG/5ML
0.2500 mg | Freq: Once | INTRAVENOUS | Status: AC
  Administered 2021-08-01: 0.25 mg via INTRAVENOUS
  Filled 2021-08-01: qty 5

## 2021-08-01 MED ORDER — HEPARIN SOD (PORK) LOCK FLUSH 100 UNIT/ML IV SOLN
500.0000 [IU] | Freq: Once | INTRAVENOUS | Status: AC | PRN
  Administered 2021-08-01: 500 [IU]

## 2021-08-01 MED ORDER — SODIUM CHLORIDE 0.9 % IV SOLN
105.0000 mg/m2 | Freq: Once | INTRAVENOUS | Status: AC
  Administered 2021-08-01: 186 mg via INTRAVENOUS
  Filled 2021-08-01: qty 31

## 2021-08-01 MED ORDER — SODIUM CHLORIDE 0.9 % IV SOLN
505.2000 mg | Freq: Once | INTRAVENOUS | Status: AC
  Administered 2021-08-01: 510 mg via INTRAVENOUS
  Filled 2021-08-01: qty 51

## 2021-08-01 MED ORDER — FAMOTIDINE IN NACL 20-0.9 MG/50ML-% IV SOLN
20.0000 mg | Freq: Once | INTRAVENOUS | Status: AC
  Administered 2021-08-01: 20 mg via INTRAVENOUS
  Filled 2021-08-01: qty 50

## 2021-08-01 NOTE — Progress Notes (Signed)
[facility] ?OFFICE PROGRESS NOTE ? ?Patient Care Team: ?[name], MD as PCP - General (Family Medicine) ?[name], RN as Oncology Nurse Navigator (Oncology) ? ?ASSESSMENT & PLAN:  ?Endometrial carcinoma ([place]) ?Overall, she tolerated last cycle of treatment well except for some fatigue, slight worsening neuropathy and some constipation ?Due to signs of worsening peripheral neuropathy, I plan to reduce the dose of Taxol further ? ?Peripheral neuropathy due to chemotherapy [facility]) ?she has mild peripheral neuropathy, likely related to side effects of treatment. ?I plan to reduce the dose of treatment as outlined above.  ?I explained to the patient the rationale of this strategy and reassured the patient it would not compromise the efficacy of treatment ? ? ?Anemia due to antineoplastic chemotherapy ?This is likely due to recent treatment. The patient denies recent history of bleeding such as epistaxis, hematuria or hematochezia.  This was a contributing factor to her worsening fatigue ?Observe for now ? ?No orders of the defined types were placed in this encounter. ? ? ?All questions were answered. The patient knows to call the clinic with any problems, questions or concerns. ?The total time spent in the appointment was 30 minutes encounter with patients including review of chart and various tests results, discussions about plan of care and coordination of care plan ?  ?Heath Lark, MD ?08/01/2021 8:34 AM ? ?INTERVAL HISTORY: ?Please see below for problem oriented charting. ?she returns for treatment follow-up seen prior to cycle 5 of carboplatin and paclitaxel ?She has slight worsening neuropathy with persistent numbness/pins and needle sensation in her hands and some in her feet ?Denies recent nausea ?She had 1 episode of diarrhea and low-grade fever yesterday that has completely resolved ? ?REVIEW OF SYSTEMS:   ?Constitutional: Denies fevers, chills or abnormal weight loss ?Eyes: Denies  blurriness of vision ?Ears, nose, mouth, throat, and face: Denies mucositis or sore throat ?Respiratory: Denies cough, dyspnea or wheezes ?Cardiovascular: Denies palpitation, chest discomfort or lower extremity swelling ?Skin: Denies abnormal skin rashes ?Lymphatics: Denies new lymphadenopathy or easy bruising ?Behavioral/Psych: Mood is stable, no new changes  ?All other systems were reviewed with the patient and are negative. ? ?I have reviewed the past medical history, past surgical history, social history and family history with the patient and they are unchanged from previous note. ? ?ALLERGIES:  is allergic to augmentin [amoxicillin-pot clavulanate], morphine and related, amoxicillin, asa [aspirin], banana, ceftin [cefuroxime axetil], citrus, lactose intolerance (gi), orange oil, penicillins, cefuroxime, clavulanic acid, tetanus-diphth-acell pertussis, and tape. ? ?MEDICATIONS:  ?Current Outpatient Medications  ?Medication Sig Dispense Refill  ? acetaminophen (TYLENOL) 500 MG tablet Take 1,000 mg by mouth at bedtime as needed (Arthritis pain).    ? cyclobenzaprine (FLEXERIL) 10 MG tablet Take 5-10 mg by mouth at bedtime as needed for muscle spasms. (Patient not taking: Reported on 07/26/2021)  0  ? cycloSPORINE (RESTASIS) 0.05 % ophthalmic emulsion Place 1 drop into both eyes 2 (two) times daily.     ? dexamethasone (DECADRON) 4 MG tablet Take 2 tabs at the night before chemotherapy, every 3 weeks, by mouth x 6 cycles 36 tablet 6  ? esomeprazole (NEXIUM) 20 MG capsule Take 20 mg by mouth daily.    ? fluocinonide gel (LIDEX) 9.37 % Apply 1 application topically daily as needed (canker sores).     ? fluticasone (FLONASE) 50 MCG/ACT nasal spray Place 2 sprays into both nostrils daily.     ? hyoscyamine (LEVSIN) 0.125 MG tablet Take 0.125 mg by mouth 3 (  three) times daily as needed (IBS). (Patient not taking: Reported on 07/26/2021)    ? Lactobacillus (ACIDOPHILUS PO) Take 5 mg by mouth daily.    ? lidocaine-prilocaine  (EMLA) cream Apply to affected area once 30 g 3  ? MELOXICAM PO Take 15 mg by mouth daily as needed (Arthritis pain). (Patient not taking: Reported on 07/26/2021)    ? Multiple Vitamin (MULTIVITAMIN) capsule Take 1 capsule by mouth in the morning.     ? Olopatadine HCl 0.2 % SOLN Place 1 drop into both eyes daily as needed (Allergies/Seasonal).    ? ondansetron (ZOFRAN) 8 MG tablet Take 1 tablet (8 mg total) by mouth 2 (two) times daily as needed. Start on the third day after chemotherapy. (Patient not taking: Reported on 07/26/2021) 30 tablet 1  ? Peppermint Oil (IBGARD PO) Take 180 mg by mouth daily as needed (IBS). (Patient not taking: Reported on 07/26/2021)    ? Polyethyl Glycol-Propyl Glycol (SYSTANE) 0.4-0.3 % SOLN Place 1 drop into both eyes daily as needed (Dry eye).    ? prochlorperazine (COMPAZINE) 10 MG tablet Take 1 tablet (10 mg total) by mouth every 6 (six) hours as needed (Nausea or vomiting). (Patient not taking: Reported on 07/26/2021) 30 tablet 1  ? senna-docusate (SENOKOT-S) 8.6-50 MG tablet Take 2 tablets by mouth at bedtime. 60 tablet 0  ? vitamin E 400 UNIT capsule Take 400 Units by mouth daily.     ? ?No current facility-administered medications for this visit.  ? ?Facility-Administered Medications Ordered in Other Visits  ?Medication Dose Route Frequency Provider Last Rate Last Admin  ? CARBOplatin (PARAPLATIN) 510 mg in sodium chloride 0.9 % 250 mL chemo infusion  510 mg Intravenous Once Alvy Bimler, Cohan Stipes, MD      ? dexamethasone (DECADRON) 10 mg in sodium chloride 0.9 % 50 mL IVPB  10 mg Intravenous Once Alvy Bimler, Raffaella Edison, MD      ? diphenhydrAMINE (BENADRYL) injection 25 mg  25 mg Intravenous Once Alvy Bimler, Anthonio Mizzell, MD      ? famotidine (PEPCID) IVPB 20 mg premix  20 mg Intravenous Once Alvy Bimler, Kaine Mcquillen, MD      ? fosaprepitant (EMEND) 150 mg in sodium chloride 0.9 % 145 mL IVPB  150 mg Intravenous Once Alvy Bimler, Vonte Rossin, MD      ? heparin lock flush 100 unit/mL  500 Units Intracatheter Once PRN Heath Lark, MD      ?  PACLitaxel (TAXOL) 186 mg in sodium chloride 0.9 % 250 mL chemo infusion (> 61m/m2)  105 mg/m2 (Treatment Plan Recorded) Intravenous Once GAlvy Bimler Pailyn [place], MD      ? palonosetron (ALOXI) injection 0.25 mg  0.25 mg Intravenous Once Kaysan Peixoto, MD      ? sodium chloride flush (NS) 0.9 % injection 10 mL  10 mL Intracatheter PRN GHeath Lark MD      ? ? ?SUMMARY OF ONCOLOGIC HISTORY: ?Oncology History Overview Note  ?CA-125 on 04/06/21: 10.2 ?High grade serous, Her 2 neg ?Genetics from 04/30/18: VUS ? ? ?MMR IHC intact ?  ?History of lung cancer (Resolved)  ?04/28/2012 Initial Diagnosis  ? Lung cancer, diagnosed in March of 2006 stage II a status post left trisegmentectomy with lymph node dissection followed by 4 cycles of adjuvant chemotherapy ? ?  ?05/06/2018 Genetic Testing  ? MEN1 c.875C>T and MSH3 c.1366G>A VUS identified on the multicancer gene panel.  The Multi-Gene Panel offered by [facility] includes sequencing and/or deletion duplication testing of the following 84 genes: AIP, ALK, APC, ATM, AXIN2,BAP1,  BARD1, BLM, BMPR1A, BRCA1, BRCA2, BRIP1, CASR, CDC73, CDH1, CDK4, CDKN1B, CDKN1C, CDKN2A (p14ARF), CDKN2A (p16INK4a), CEBPA, CHEK2, CTNNA1, DICER1, DIS3L2, EGFR (c.2369C>T, p.Thr790Met variant only), EPCAM (Deletion/duplication testing only), FH, FLCN, GATA2, GPC3, GREM1 (Promoter region deletion/duplication testing only), HOXB13 (c.251G>A, p.Gly84Glu), HRAS, KIT, MAX, MEN1, MET, MITF (c.952G>A, p.Glu318Lys variant only), MLH1, MSH2, MSH3, MSH6, MUTYH, NBN, NF1, NF2, NTHL1, PALB2, PDGFRA, PHOX2B, PMS2, POLD1, POLE, POT1, PRKAR1A, PTCH1, PTEN, RAD50, RAD51C, RAD51D, RB1, RECQL4, RET, RUNX1, SDHAF2, SDHA (sequence changes only), SDHB, SDHC, SDHD, SMAD4, SMARCA4, SMARCB1, SMARCE1, STK11, SUFU, TERC, TERT, TMEM127, TP53, TSC1, TSC2, VHL, WRN and WT1.  The report date is May 06, 2018. ? ?  ?History of ductal carcinoma in situ (DCIS) of breast  ?04/28/2012 Initial Diagnosis  ? 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 ?  ?05/06/2018 Genetic Testing  ? MEN1 c.875C>T and MSH3 c.1366G>A VUS identified on the multicancer gene panel.  The Multi-Gene Panel of[facility]

## 2021-08-01 NOTE — Assessment & Plan Note (Signed)
she has mild peripheral neuropathy, likely related to side effects of treatment. °I plan to reduce the dose of treatment as outlined above.  °I explained to the patient the rationale of this strategy and reassured the patient it would not compromise the efficacy of treatment ° °

## 2021-08-01 NOTE — Assessment & Plan Note (Signed)
This is likely due to recent treatment. The patient denies recent history of bleeding such as epistaxis, hematuria or hematochezia.  This was a contributing factor to her worsening fatigue ?Observe for now ?

## 2021-08-01 NOTE — Patient Instructions (Signed)
[facility] MEDICAL [facility]COLOGY  Discharge Instructions: Thank you for choosing [facility] to provide your oncology and hematology care.   If you have a lab appointment with the Cancer Center, please go directly to the Cancer Center and check in at the registration area.   Wear comfortable clothing and clothing appropriate for easy access to any Portacath or PICC line.   We strive to give you quality time with your provider. You may need to reschedule your appointment if you arrive late (15 or 70 or more minutes).  Arriving late affects you and other patients whose appointments are after yours. 70  Also, if you miss three or more appointments without notifying the office, you may be dismissed from the clinic at the provider's discretion.      For prescription refill requests, have your pharmacy contact our office and allow 72 hours for refills to be completed.    Today you received the following chemotherapy and/or immunotherapy agents Taxol and Carboplatin       To help prevent nausea and vomiting after your treatment, we encourage you to take your nausea medication as directed.  BELOW ARE SYMPTOMS THAT SHOULD BE REPORTED IMMEDIATELY: *FEVER GREATER THAN 100.4 F (38 C) OR HIGHER *CHILLS OR SWEATING *NAUSEA AND VOMITING THAT IS NOT CONTROLLED WITH YOUR NAUSEA MEDICATION *UNUSUAL SHORTNESS OF BREATH *UNUSUAL BRUISING OR BLEEDING *URINARY PROBLEMS (pain or burning when urinating, or frequent urination) *BOWEL PROBLEMS (unusual diarrhea, constipation, pain near the anus) TENDERNESS IN MOUTH AND THROAT WITH OR WITHOUT PRESENCE OF ULCERS (sore throat, sores in mouth, or a toothache) UNUSUAL RASH, SWELLING OR PAIN  UNUSUAL VAGINAL DISCHARGE OR ITCHING   Items with * indicate a potential emergency and should be followed up as soon as possible or go to the Emergency Department if any problems should occur.  Please show the CHEMOTHERAPY ALERT CARD or IMMUNOTHERAPY ALERT CARD at  check-in to the Emergency Department and triage nurse.  Should you have questions after your visit or need to cancel or reschedule your appointment, please contact [facility]  Dept: [phone]  and follow the prompts.  Office hours are 8:00 a.m. to 4:30 p.m. Monday - Friday. Please note that voicemails left after 4:00 p.m. may not be returned until the following business day.  We are closed weekends and major holidays. You have access to a nurse at all times for urgent questions. Please call the main number to the clinic Dept: [phone] and follow the prompts.   For any non-urgent questions, you may also contact your provider using MyChart. We now offer e-Visits for anyone 18 and older to request care online for non-urgent symptoms. For details visit [email or web address].   Also download the MyChart app! Go to the app store, search "MyChart", open the app, select , and log in with your MyChart username and password.  Due to Covid, a mask is required upon entering the hospital/clinic. If you do not have a mask, one will be given to you upon arrival. For doctor visits, patients may have 1 support person aged 18 or older with them. For treatment visits, patients cannot have anyone with them due to current Covid guidelines and our immunocompromised population.   

## 2021-08-01 NOTE — Assessment & Plan Note (Signed)
Overall, she tolerated last cycle of treatment well except for some fatigue, slight worsening neuropathy and some constipation ?Due to signs of worsening peripheral neuropathy, I plan to reduce the dose of Taxol further ?

## 2021-08-04 ENCOUNTER — Telehealth: Payer: Self-pay | Admitting: *Deleted

## 2021-08-04 NOTE — Telephone Encounter (Signed)
Called patient to remind of HDR Tx. for 08-07-21 @ 2 pm, spoke with patient and she is aware of this tx. ?

## 2021-08-06 NOTE — Progress Notes (Signed)
?  Radiation Oncology         [phone] ?________________________________ ? ?Name: [name] MRN: [id number]  ?Date: 08/07/2021  DOB: [birth year] ? ?CC: [name], MD  [name], MD ? ?HDR BRACHYTHERAPY NOTE ? ?DIAGNOSIS: The encounter diagnosis was Endometrial carcinoma ([place]). ?  ?FIGO Stage IA (pT1a, pN0, cM0) Endometrial Cancer, high-grade serous ?  ?Simple treatment device note: ?Patient had construction of her custom vaginal cylinder. She will be treated with a 3.0 cm diameter segmented cylinder. This conforms to her anatomy without undue discomfort. ? ?Vaginal brachytherapy procedure node: ?The patient was brought to the [place] suite. Identity was confirmed. All relevant records and images related to the planned course of therapy were reviewed. The patient freely provided informed written consent to proceed with treatment after reviewing the details related to the planned course of therapy. The consent form was witnessed and verified by the simulation staff. Then, the patient was set-up in a stable reproducible supine position for radiation therapy. Pelvic exam revealed the vaginal cuff to be intact . The patient's custom vaginal cylinder was placed in the proximal vagina. This was affixed to the CT/MR stabilization plate to prevent slippage. Patient tolerated the placement well. ? ?Verification simulation note:  ?A fiducial marker was placed within the vaginal cylinder. An AP and lateral film was then obtained through the pelvis area. This documented accurate position of the vaginal cylinder for treatment. ? ?HDR BRACHYTHERAPY TREATMENT  ?The remote afterloading device was affixed to the vaginal cylinder by catheter. Patient then proceeded to undergo her third high-dose-rate treatment directed at the proximal vagina. The patient was prescribed a dose of 6.0 gray to be delivered to the mucosal surface. Treatment length was 3.0 cm. Patient was treated with 1 channel using 7 dwell positions.  Treatment time was 227.8 seconds. Iridium 192 was the high-dose-rate source for treatment. The patient tolerated the treatment well. After completion of her therapy, a radiation survey was performed documenting return of the iridium source into the GammaMed safe. ?  ?PLAN: The patient will return next week for her fourth high-dose-rate treatment. ?________________________________  ?----------------------------------- ? ?[name], PhD, MD ? ?This document serves as a record of services personally performed by Gery Pray, MD. It was created on his behalf by Roney Mans, a trained medical scribe. The creation of this record is based on the scribe's personal observations and the provider's statements to them. This document has been checked and approved by the attending provider. ? ? ?

## 2021-08-07 ENCOUNTER — Other Ambulatory Visit: Payer: Self-pay

## 2021-08-07 ENCOUNTER — Ambulatory Visit
Admission: RE | Admit: 2021-08-07 | Discharge: 2021-08-07 | Disposition: A | Discharge status: Home / Self Care | Payer: Medicare Other | Source: Ambulatory Visit | Attending: Radiation Oncology | Admitting: Radiation Oncology

## 2021-08-07 DIAGNOSIS — C541 Malignant neoplasm of endometrium: Secondary | ICD-10-CM

## 2021-08-07 DIAGNOSIS — Z510 Encounter for antineoplastic radiation therapy: Secondary | ICD-10-CM | POA: Diagnosis not present

## 2021-08-07 LAB — RAD ONC ARIA SESSION SUMMARY
Course Elapsed Days: 11
Plan Fractions Treated to Date: 3
Plan Prescribed Dose Per Fraction: 6 Gy
Plan Total Fractions Prescribed: 5
Plan Total Prescribed Dose: 30 Gy
Reference Point Dosage Given to Date: 18 Gy
Reference Point Session Dosage Given: 6 Gy
Session Number: 3

## 2021-08-11 ENCOUNTER — Telehealth: Payer: Self-pay | Admitting: *Deleted

## 2021-08-11 NOTE — Progress Notes (Signed)
  Radiation Oncology         (336) [phone] ________________________________  Name: [name] MRN: [id number]  Date: 08/14/2021  DOB: 02-Jun-1950  CC: [name], MD  [name], MD  HDR BRACHYTHERAPY NOTE  DIAGNOSIS: The encounter diagnosis was Endometrial carcinoma [facility]).   FIGO Stage IA (pT1a, pN0, cM0) Endometrial Cancer, high-grade serous   Simple treatment device note: Patient had construction of her custom vaginal cylinder. She will be treated with a 3.0 cm diameter segmented cylinder. This conforms to her anatomy without undue discomfort.  Vaginal brachytherapy procedure node: The patient was brought to the [place] suite. Identity was confirmed. All relevant records and images related to the planned course of therapy were reviewed. The patient freely provided informed written consent to proceed with treatment after reviewing the details related to the planned course of therapy. The consent form was witnessed and verified by the simulation staff. Then, the patient was set-up in a stable reproducible supine position for radiation therapy. Pelvic exam revealed the vaginal cuff to be intact . The patient's custom vaginal cylinder was placed in the proximal vagina. This was affixed to the CT/MR stabilization plate to prevent slippage. Patient tolerated the placement well.  Verification simulation note:  A fiducial marker was placed within the vaginal cylinder. An AP and lateral film was then obtained through the pelvis area. This documented accurate position of the vaginal cylinder for treatment.  HDR BRACHYTHERAPY TREATMENT  The remote afterloading device was affixed to the vaginal cylinder by catheter. Patient then proceeded to undergo her fourth high-dose-rate treatment directed at the proximal vagina. The patient was prescribed a dose of 6.0 gray to be delivered to the mucosal surface. Treatment length was 3.0 cm. Patient was treated with 1 channel using 7 dwell positions.  Treatment time was 243.2 seconds. Iridium 192 was the high-dose-rate source for treatment. The patient tolerated the treatment well. After completion of her therapy, a radiation survey was performed documenting return of the iridium source into the GammaMed safe.   PLAN: The patient will return later this week for her fifth high-dose-rate treatment. ________________________________  -----------------------------------  [name], PhD, MD  This document serves as a record of services personally performed by Gery Pray, MD. It was created on his behalf by Roney Mans, a trained medical scribe. The creation of this record is based on the scribe's personal observations and the provider's statements to them. This document has been checked and approved by the attending provider.

## 2021-08-11 NOTE — Telephone Encounter (Signed)
Returned patient's phone call, spoke with patient 

## 2021-08-11 NOTE — Telephone Encounter (Signed)
CALLED PATIENT TO REMIND OF HDR [place] 08-14-21 @ 2 PM, SPOKE WITH PATIENT AND SHE IS AWARE OF THIS [place]

## 2021-08-14 ENCOUNTER — Other Ambulatory Visit: Payer: Self-pay

## 2021-08-14 ENCOUNTER — Ambulatory Visit
Admission: RE | Admit: 2021-08-14 | Discharge: 2021-08-14 | Disposition: A | Discharge status: Home / Self Care | Payer: Medicare Other | Source: Ambulatory Visit | Attending: Radiation Oncology | Admitting: Radiation Oncology

## 2021-08-14 DIAGNOSIS — C541 Malignant neoplasm of endometrium: Secondary | ICD-10-CM

## 2021-08-14 DIAGNOSIS — Z510 Encounter for antineoplastic radiation therapy: Secondary | ICD-10-CM | POA: Diagnosis not present

## 2021-08-14 LAB — RAD ONC ARIA SESSION SUMMARY
Course Elapsed Days: 18
Plan Fractions Treated to Date: 4
Plan Prescribed Dose Per Fraction: 6 Gy
Plan Total Fractions Prescribed: 5
Plan Total Prescribed Dose: 30 Gy
Reference Point Dosage Given to Date: 24 Gy
Reference Point Session Dosage Given: 6 Gy
Session Number: 4

## 2021-08-16 ENCOUNTER — Ambulatory Visit: Payer: Medicare Other | Admitting: Radiation Oncology

## 2021-08-16 ENCOUNTER — Telehealth: Payer: Self-pay | Admitting: *Deleted

## 2021-08-16 NOTE — Telephone Encounter (Signed)
CALLED PATIENT TO REMIND OF HDR [place] 08-17-21 @ 10 AM, SPOKE WITH PATIENT AND SHE IS AWARE OF THIS [place].

## 2021-08-16 NOTE — Progress Notes (Signed)
  Radiation Oncology         [phone] ________________________________  Name: [name] MRN: [id number]  Date: 08/17/2021  DOB: [birth year]  CC: [name], MD  [name], MD  HDR BRACHYTHERAPY NOTE  DIAGNOSIS: The encounter diagnosis was Endometrial carcinoma [facility]).   FIGO Stage IA (pT1a, pN0, cM0) Endometrial Cancer, high-grade serous   Simple treatment device note: Patient had construction of her custom vaginal cylinder. She will be treated with a 3.0 cm diameter segmented cylinder. This conforms to her anatomy without undue discomfort.  Vaginal brachytherapy procedure node: The patient was brought to the [place] suite. Identity was confirmed. All relevant records and images related to the planned course of therapy were reviewed. The patient freely provided informed written consent to proceed with treatment after reviewing the details related to the planned course of therapy. The consent form was witnessed and verified by the simulation staff. Then, the patient was set-up in a stable reproducible supine position for radiation therapy. Pelvic exam revealed the vaginal cuff to be intact . The patient's custom vaginal cylinder was placed in the proximal vagina. This was affixed to the CT/MR stabilization plate to prevent slippage. Patient tolerated the placement well.  Verification simulation note:  A fiducial marker was placed within the vaginal cylinder. An AP and lateral film was then obtained through the pelvis area. This documented accurate position of the vaginal cylinder for treatment.  HDR BRACHYTHERAPY TREATMENT  The remote afterloading device was affixed to the vaginal cylinder by catheter. Patient then proceeded to undergo her fifth high-dose-rate treatment directed at the proximal vagina. The patient was prescribed a dose of 6.0 gray to be delivered to the mucosal surface. Treatment length was 3.0 cm. Patient was treated with 1 channel using 7 dwell positions.  Treatment time was 250.1 seconds. Iridium 192 was the high-dose-rate source for treatment. The patient tolerated the treatment well. After completion of her therapy, a radiation survey was performed documenting return of the iridium source into the GammaMed safe.   PLAN: The patient has completed her fifth and final high-dose-rate treatment. She will return in one month for routine follow up.  Overall she tolerated her vaginal brachytherapy extremely well.  She will receive her last cycle of chemotherapy next week.  She is looking forward to completing all of her adjuvant treatment. ________________________________  -----------------------------------  [name], PhD, MD  This document serves as a record of services personally performed by Gery Pray, MD. It was created on his behalf by Roney Mans, a trained medical scribe. The creation of this record is based on the scribe's personal observations and the provider's statements to them. This document has been checked and approved by the attending provider.

## 2021-08-17 ENCOUNTER — Other Ambulatory Visit: Payer: Self-pay

## 2021-08-17 ENCOUNTER — Encounter: Payer: Self-pay | Admitting: Radiation Oncology

## 2021-08-17 ENCOUNTER — Ambulatory Visit
Admission: RE | Admit: 2021-08-17 | Discharge: 2021-08-17 | Disposition: A | Discharge status: Home / Self Care | Payer: Medicare Other | Source: Ambulatory Visit | Attending: Radiation Oncology | Admitting: Radiation Oncology

## 2021-08-17 DIAGNOSIS — Z510 Encounter for antineoplastic radiation therapy: Secondary | ICD-10-CM | POA: Diagnosis not present

## 2021-08-17 DIAGNOSIS — C541 Malignant neoplasm of endometrium: Secondary | ICD-10-CM

## 2021-08-17 LAB — RAD ONC ARIA SESSION SUMMARY
Course Elapsed Days: 21
Plan Fractions Treated to Date: 5
Plan Prescribed Dose Per Fraction: 6 Gy
Plan Total Fractions Prescribed: 5
Plan Total Prescribed Dose: 30 Gy
Reference Point Dosage Given to Date: 30 Gy
Reference Point Session Dosage Given: 6 Gy
Session Number: 5

## 2021-08-18 MED FILL — Dexamethasone Sodium Phosphate Inj 100 MG/10ML: INTRAMUSCULAR | Qty: 1 | Status: AC

## 2021-08-18 MED FILL — Fosaprepitant Dimeglumine For IV Infusion 150 MG (Base Eq): INTRAVENOUS | Qty: 5 | Status: AC

## 2021-08-22 ENCOUNTER — Encounter: Payer: Self-pay | Admitting: Hematology and Oncology

## 2021-08-22 ENCOUNTER — Inpatient Hospital Stay (HOSPITAL_BASED_OUTPATIENT_CLINIC_OR_DEPARTMENT_OTHER): Payer: Medicare Other | Admitting: Hematology and Oncology

## 2021-08-22 ENCOUNTER — Inpatient Hospital Stay: Payer: Medicare Other

## 2021-08-22 ENCOUNTER — Other Ambulatory Visit: Payer: Self-pay | Admitting: Hematology and Oncology

## 2021-08-22 ENCOUNTER — Other Ambulatory Visit: Payer: Self-pay

## 2021-08-22 VITALS — BP 159/73 | HR 101 | Temp 97.4°F | Resp 18 | Ht 63.0 in | Wt 154.4 lb

## 2021-08-22 VITALS — HR 88 | Resp 18

## 2021-08-22 DIAGNOSIS — K5909 Other constipation: Secondary | ICD-10-CM

## 2021-08-22 DIAGNOSIS — G620 Drug-induced polyneuropathy: Secondary | ICD-10-CM

## 2021-08-22 DIAGNOSIS — C541 Malignant neoplasm of endometrium: Secondary | ICD-10-CM | POA: Diagnosis not present

## 2021-08-22 DIAGNOSIS — H9312 Tinnitus, left ear: Secondary | ICD-10-CM

## 2021-08-22 DIAGNOSIS — D6481 Anemia due to antineoplastic chemotherapy: Secondary | ICD-10-CM

## 2021-08-22 DIAGNOSIS — Z510 Encounter for antineoplastic radiation therapy: Secondary | ICD-10-CM | POA: Diagnosis not present

## 2021-08-22 DIAGNOSIS — T451X5A Adverse effect of antineoplastic and immunosuppressive drugs, initial encounter: Secondary | ICD-10-CM

## 2021-08-22 LAB — CBC WITH DIFFERENTIAL (CANCER CENTER ONLY)
Abs Immature Granulocytes: 0.02 10*3/uL (ref 0.00–0.07)
Basophils Absolute: 0 10*3/uL (ref 0.0–0.1)
Basophils Relative: 0 %
Eosinophils Absolute: 0 10*3/uL (ref 0.0–0.5)
Eosinophils Relative: 0 %
HCT: 29.9 % — ABNORMAL LOW (ref 36.0–46.0)
Hemoglobin: 10.2 g/dL — ABNORMAL LOW (ref 12.0–15.0)
Immature Granulocytes: 1 %
Lymphocytes Relative: 15 %
Lymphs Abs: 0.5 10*3/uL — ABNORMAL LOW (ref 0.7–4.0)
MCH: 33 pg (ref 26.0–34.0)
MCHC: 34.1 g/dL (ref 30.0–36.0)
MCV: 96.8 fL (ref 80.0–100.0)
Monocytes Absolute: 0.2 10*3/uL (ref 0.1–1.0)
Monocytes Relative: 6 %
Neutro Abs: 2.6 10*3/uL (ref 1.7–7.7)
Neutrophils Relative %: 78 %
Platelet Count: 190 10*3/uL (ref 150–400)
RBC: 3.09 MIL/uL — ABNORMAL LOW (ref 3.87–5.11)
RDW: 17.3 % — ABNORMAL HIGH (ref 11.5–15.5)
WBC Count: 3.3 10*3/uL — ABNORMAL LOW (ref 4.0–10.5)
nRBC: 0 % (ref 0.0–0.2)

## 2021-08-22 LAB — CMP (CANCER CENTER ONLY)
ALT: 15 U/L (ref 0–44)
AST: 19 U/L (ref 15–41)
Albumin: 4 g/dL (ref 3.5–5.0)
Alkaline Phosphatase: 83 U/L (ref 38–126)
Anion gap: 8 (ref 5–15)
BUN: 16 mg/dL (ref 8–23)
CO2: 25 mmol/L (ref 22–32)
Calcium: 9.6 mg/dL (ref 8.9–10.3)
Chloride: 106 mmol/L (ref 98–111)
Creatinine: 0.67 mg/dL (ref 0.44–1.00)
GFR, Estimated: >60 mL/min (ref >60)
Glucose, Bld: 133 mg/dL — ABNORMAL HIGH (ref 70–99)
Potassium: 3.8 mmol/L (ref 3.5–5.1)
Sodium: 139 mmol/L (ref 135–145)
Total Bilirubin: 0.3 mg/dL (ref 0.3–1.2)
Total Protein: 7.1 g/dL (ref 6.5–8.1)

## 2021-08-22 MED ORDER — HEPARIN SOD (PORK) LOCK FLUSH 100 UNIT/ML IV SOLN
500.0000 [IU] | Freq: Once | INTRAVENOUS | Status: AC | PRN
  Administered 2021-08-22: 500 [IU]

## 2021-08-22 MED ORDER — SODIUM CHLORIDE 0.9% FLUSH
10.0000 mL | INTRAVENOUS | Status: DC | PRN
  Administered 2021-08-22: 10 mL

## 2021-08-22 MED ORDER — SODIUM CHLORIDE 0.9 % IV SOLN: Freq: Once | INTRAVENOUS | Status: AC

## 2021-08-22 MED ORDER — PALONOSETRON HCL INJECTION 0.25 MG/5ML
0.2500 mg | Freq: Once | INTRAVENOUS | Status: AC
  Administered 2021-08-22: 0.25 mg via INTRAVENOUS
  Filled 2021-08-22: qty 5

## 2021-08-22 MED ORDER — SODIUM CHLORIDE 0.9 % IV SOLN
150.0000 mg | Freq: Once | INTRAVENOUS | Status: AC
  Administered 2021-08-22: 150 mg via INTRAVENOUS
  Filled 2021-08-22: qty 150

## 2021-08-22 MED ORDER — SODIUM CHLORIDE 0.9 % IV SOLN
10.0000 mg | Freq: Once | INTRAVENOUS | Status: AC
  Administered 2021-08-22: 10 mg via INTRAVENOUS
  Filled 2021-08-22: qty 10

## 2021-08-22 MED ORDER — FAMOTIDINE IN NACL 20-0.9 MG/50ML-% IV SOLN
20.0000 mg | Freq: Once | INTRAVENOUS | Status: AC
  Administered 2021-08-22: 20 mg via INTRAVENOUS
  Filled 2021-08-22: qty 50

## 2021-08-22 MED ORDER — DIPHENHYDRAMINE HCL 50 MG/ML IJ SOLN
25.0000 mg | Freq: Once | INTRAMUSCULAR | Status: AC
  Administered 2021-08-22: 25 mg via INTRAVENOUS
  Filled 2021-08-22: qty 1

## 2021-08-22 MED ORDER — SODIUM CHLORIDE 0.9 % IV SOLN
421.0000 mg | Freq: Once | INTRAVENOUS | Status: AC
  Administered 2021-08-22: 420 mg via INTRAVENOUS
  Filled 2021-08-22: qty 42

## 2021-08-22 MED ORDER — SODIUM CHLORIDE 0.9 % IV SOLN
105.0000 mg/m2 | Freq: Once | INTRAVENOUS | Status: AC
  Administered 2021-08-22: 186 mg via INTRAVENOUS
  Filled 2021-08-22: qty 31

## 2021-08-22 MED ORDER — SODIUM CHLORIDE 0.9% FLUSH
10.0000 mL | Freq: Once | INTRAVENOUS | Status: AC
  Administered 2021-08-22: 10 mL

## 2021-08-22 NOTE — Assessment & Plan Note (Signed)
Overall, she tolerated last cycle of treatment well except for some fatigue, slight worsening neuropathy, tinnitus, postnasal drip and some constipation She will continue similar reduced dose Taxol Due to worsening tinnitus, I plan to reduce carboplatin to AUC of 5 She will complete chemotherapy today I plan to order repeat blood work and CT imaging next month for further follow-up If scans are all clear, we will get her port removed

## 2021-08-22 NOTE — Assessment & Plan Note (Signed)
She has slight worsening tinnitus Plan to reduce carboplatin a little bit

## 2021-08-22 NOTE — Assessment & Plan Note (Signed)
she has mild peripheral neuropathy, likely related to side effects of treatment. She will continue similar reduced dose treatment

## 2021-08-22 NOTE — Assessment & Plan Note (Signed)
We discussed the importance of regular laxative therapy after

## 2021-08-22 NOTE — Progress Notes (Signed)
[place] OFFICE PROGRESS NOTE  Patient Care Team: [name], MD as PCP - General (Family Medicine) [name], RN as Oncology Nurse Navigator (Oncology)  ASSESSMENT & PLAN:  Endometrial carcinoma ([place]) Overall, she tolerated last cycle of treatment well except for some fatigue, slight worsening neuropathy, tinnitus, postnasal drip and some constipation She will continue similar reduced dose Taxol Due to worsening tinnitus, I plan to reduce carboplatin to AUC of 5 She will complete chemotherapy today I plan to order repeat blood work and CT imaging next month for further follow-up If scans are all clear, we will get her port removed  Peripheral neuropathy due to chemotherapy [facility]) she has mild peripheral neuropathy, likely related to side effects of treatment. She will continue similar reduced dose treatment  Tinnitus, left ear She has slight worsening tinnitus Plan to reduce carboplatin a little bit  Other constipation We discussed the importance of regular laxative therapy after  Anemia due to antineoplastic chemotherapy This is likely due to recent treatment. The patient denies recent history of bleeding such as epistaxis, hematuria or hematochezia.   We will proceed with treatment without delay  Orders Placed This Encounter  Procedures   CT CHEST ABDOMEN PELVIS W CONTRAST    Standing Status:   Future    Standing Expiration Date:   08/23/2022    Order Specific Question:   Preferred imaging location?    Answer:   [facility]    Order Specific Question:   Radiology Contrast Protocol - do NOT remove file path    Answer:   [email or web address]    All questions were answered. The patient knows to call the clinic with any problems, questions or concerns. The total time spent in the appointment was 30 minutes encounter with patients including review of chart and various tests results, discussions about plan  of care and coordination of care plan   Heath Lark, MD 08/22/2021 11:25 AM  INTERVAL HISTORY: Please see below for problem oriented charting. she returns for treatment follow-up seen prior to cycle 6 of treatment She developed constipation with recent treatment She has fatigue and changes in bowel habits that lasted for approximately 2 weeks She have postnasal drip, mucus production and secretion after chemo that resolved spontaneously She has noted more tinnitus on the left side of the ear  REVIEW OF SYSTEMS:   Constitutional: Denies fevers, chills or abnormal weight loss Eyes: Denies blurriness of vision Ears, nose, mouth, throat, and face: Denies mucositis or sore throat Cardiovascular: Denies palpitation, chest discomfort or lower extremity swelling Skin: Denies abnormal skin rashes Lymphatics: Denies new lymphadenopathy or easy bruising Behavioral/Psych: Mood is stable, no new changes  All other systems were reviewed with the patient and are negative.  I have reviewed the past medical history, past surgical history, social history and family history with the patient and they are unchanged from previous note.  ALLERGIES:  is allergic to augmentin [amoxicillin-pot clavulanate], morphine and related, amoxicillin, asa [aspirin], banana, ceftin [cefuroxime axetil], citrus, lactose intolerance (gi), orange oil, penicillins, cefuroxime, clavulanic acid, tetanus-diphth-acell pertussis, and tape.  MEDICATIONS:  Current Outpatient Medications  Medication Sig Dispense Refill   acetaminophen (TYLENOL) 500 MG tablet Take 1,000 mg by mouth at bedtime as needed (Arthritis pain).     cyclobenzaprine (FLEXERIL) 10 MG tablet Take 5-10 mg by mouth at bedtime as needed for muscle spasms. (Patient not taking: Reported on 07/26/2021)  0   cycloSPORINE (RESTASIS) 0.05 % ophthalmic emulsion Place 1 drop into  both eyes 2 (two) times daily.      dexamethasone (DECADRON) 4 MG tablet Take 2 tabs at the night  before chemotherapy, every 3 weeks, by mouth x 6 cycles 36 tablet 6   esomeprazole (NEXIUM) 20 MG capsule Take 20 mg by mouth daily.     fluocinonide gel (LIDEX) 9.23 % Apply 1 application topically daily as needed (canker sores).      fluticasone (FLONASE) 50 MCG/ACT nasal spray Place 2 sprays into both nostrils daily.      hyoscyamine (LEVSIN) 0.125 MG tablet Take 0.125 mg by mouth 3 (three) times daily as needed (IBS). (Patient not taking: Reported on 07/26/2021)     Lactobacillus (ACIDOPHILUS PO) Take 5 mg by mouth daily.     lidocaine-prilocaine (EMLA) cream Apply to affected area once 30 g 3   MELOXICAM PO Take 15 mg by mouth daily as needed (Arthritis pain). (Patient not taking: Reported on 07/26/2021)     Multiple Vitamin (MULTIVITAMIN) capsule Take 1 capsule by mouth in the morning.      Olopatadine HCl 0.2 % SOLN Place 1 drop into both eyes daily as needed (Allergies/Seasonal).     ondansetron (ZOFRAN) 8 MG tablet Take 1 tablet (8 mg total) by mouth 2 (two) times daily as needed. Start on the third day after chemotherapy. (Patient not taking: Reported on 07/26/2021) 30 tablet 1   Peppermint Oil (IBGARD PO) Take 180 mg by mouth daily as needed (IBS). (Patient not taking: Reported on 07/26/2021)     Polyethyl Glycol-Propyl Glycol (SYSTANE) 0.4-0.3 % SOLN Place 1 drop into both eyes daily as needed (Dry eye).     prochlorperazine (COMPAZINE) 10 MG tablet Take 1 tablet (10 mg total) by mouth every 6 (six) hours as needed (Nausea or vomiting). (Patient not taking: Reported on 07/26/2021) 30 tablet 1   senna-docusate (SENOKOT-S) 8.6-50 MG tablet Take 2 tablets by mouth at bedtime. 60 tablet 0   vitamin E 400 UNIT capsule Take 400 Units by mouth daily.      No current facility-administered medications for this visit.   Facility-Administered Medications Ordered in Other Visits  Medication Dose Route Frequency Provider Last Rate Last Admin   CARBOplatin (PARAPLATIN) 420 mg in sodium chloride 0.9 % 250 mL  chemo infusion  420 mg Intravenous Once Alvy Bimler, Chevy Virgo, MD       dexamethasone (DECADRON) 10 mg in sodium chloride 0.9 % 50 mL IVPB  10 mg Intravenous Once Alvy Bimler, Story Conti, MD       diphenhydrAMINE (BENADRYL) injection 25 mg  25 mg Intravenous Once Alvy Bimler, Devontae Casasola, MD       famotidine (PEPCID) IVPB 20 mg premix  20 mg Intravenous Once Alvy Bimler, Allisa Einspahr, MD       fosaprepitant (EMEND) 150 mg in sodium chloride 0.9 % 145 mL IVPB  150 mg Intravenous Once Alvy Bimler, Favio Moder, MD       heparin lock flush 100 unit/mL  500 Units Intracatheter Once PRN Alvy Bimler, Fusaye Wachtel, MD       PACLitaxel (TAXOL) 186 mg in sodium chloride 0.9 % 250 mL chemo infusion (> $RemoveBef'80mg'eqnMdhVzKj$ /m2)  105 mg/m2 (Treatment Plan Recorded) Intravenous Once Alvy Bimler, Piers Baade, MD       palonosetron (ALOXI) injection 0.25 mg  0.25 mg Intravenous Once Lowanda Cashaw, MD       sodium chloride flush (NS) 0.9 % injection 10 mL  10 mL Intracatheter PRN Heath Lark, MD        SUMMARY OF ONCOLOGIC HISTORY: Oncology History Overview Note  CA-125  on 04/06/21: 10.2 High grade serous, Her 2 neg Genetics from 04/30/18: VUS   MMR IHC intact   History of lung cancer (Resolved)  04/28/2012 Initial Diagnosis   Lung cancer, diagnosed in March of 2006 stage II a status post left trisegmentectomy with lymph node dissection followed by 4 cycles of adjuvant chemotherapy    05/06/2018 Genetic Testing   MEN1 c.875C>T and MSH3 c.1366G>A VUS identified on the multicancer gene panel.  The Multi-Gene Panel offered by [facility] includes sequencing and/or deletion duplication testing of the following 84 genes: AIP, ALK, APC, ATM, AXIN2,BAP1,  BARD1, BLM, BMPR1A, BRCA1, BRCA2, BRIP1, CASR, CDC73, CDH1, CDK4, CDKN1B, CDKN1C, CDKN2A (p14ARF), CDKN2A (p16INK4a), CEBPA, CHEK2, CTNNA1, DICER1, DIS3L2, EGFR (c.2369C>T, p.Thr790Met variant only), EPCAM (Deletion/duplication testing only), FH, FLCN, GATA2, GPC3, GREM1 (Promoter region deletion/duplication testing only), HOXB13 (c.251G>A, p.Gly84Glu), HRAS, KIT, MAX, MEN1,  MET, MITF (c.952G>A, p.Glu318Lys variant only), MLH1, MSH2, MSH3, MSH6, MUTYH, NBN, NF1, NF2, NTHL1, PALB2, PDGFRA, PHOX2B, PMS2, POLD1, POLE, POT1, PRKAR1A, PTCH1, PTEN, RAD50, RAD51C, RAD51D, RB1, RECQL4, RET, RUNX1, SDHAF2, SDHA (sequence changes only), SDHB, SDHC, SDHD, SMAD4, SMARCA4, SMARCB1, SMARCE1, STK11, SUFU, TERC, TERT, TMEM127, TP53, TSC1, TSC2, VHL, WRN and WT1.  The report date is May 06, 2018.    History of ductal carcinoma in situ (DCIS) of breast  04/28/2012 Initial Diagnosis   Ductal carcinoma in situ of right breast diagnosed in December of 2013, status post right lumpectomy in January of 2014    05/06/2018 Genetic Testing   MEN1 c.875C>T and MSH3 c.1366G>A VUS identified on the multicancer gene panel.  The Multi-Gene Panel offered by [facility] includes sequencing and/or deletion duplication testing of the following 84 genes: AIP, ALK, APC, ATM, AXIN2,BAP1,  BARD1, BLM, BMPR1A, BRCA1, BRCA2, BRIP1, CASR, CDC73, CDH1, CDK4, CDKN1B, CDKN1C, CDKN2A (p14ARF), CDKN2A (p16INK4a), CEBPA, CHEK2, CTNNA1, DICER1, DIS3L2, EGFR (c.2369C>T, p.Thr790Met variant only), EPCAM (Deletion/duplication testing only), FH, FLCN, GATA2, GPC3, GREM1 (Promoter region deletion/duplication testing only), HOXB13 (c.251G>A, p.Gly84Glu), HRAS, KIT, MAX, MEN1, MET, MITF (c.952G>A, p.Glu318Lys variant only), MLH1, MSH2, MSH3, MSH6, MUTYH, NBN, NF1, NF2, NTHL1, PALB2, PDGFRA, PHOX2B, PMS2, POLD1, POLE, POT1, PRKAR1A, PTCH1, PTEN, RAD50, RAD51C, RAD51D, RB1, RECQL4, RET, RUNX1, SDHAF2, SDHA (sequence changes only), SDHB, SDHC, SDHD, SMAD4, SMARCA4, SMARCB1, SMARCE1, STK11, SUFU, TERC, TERT, TMEM127, TP53, TSC1, TSC2, VHL, WRN and WT1.  The report date is May 06, 2018.    Endometrial carcinoma ([place])  01/27/2021 Initial Diagnosis   Patient began having vaginal spotting on November 10th.  She had 4 days of spotting which she describes as seeing red when she wiped and having a little brown discharge on her liner.   Bleeding then stopped completely.  She saw her primary care provider and underwent an ultrasound.  Ultrasound showed thickened endometrium measuring 14 mm.  She then started bleeding again on December 14 and had a week of bleeding with a little bit more red blood this time.  She ultimately saw OB/GYN and had an endometrial biopsy.  This was first attempted however secondary to cervical stenosis, patient returned the following day after using intravaginal misoprostol.  Endometrial biopsy on 12/22 revealed FIGO grade 2/3 endometrial cancer.   02/27/2021 Imaging   US pelvis Thickened endometrium with possible 14 mm endometrial mass. In the setting of post-menopausal bleeding, endometrial sampling is indicated to exclude carcinoma   03/06/2021 Initial Biopsy   Endometrial biopsy revealed FIGO grade 2/3 endometrial cancer   03/24/2021 Initial Diagnosis   Endometrial carcinoma ([place])   03/30/2021 Imaging   CT  A/P: 1. Abnormal thickening of the endometrium, as seen on prior ultrasound and consistent with recently diagnosed endometrial malignancy. 2. No evidence of lymphadenopathy or metastatic disease in the abdomen or pelvis. 3. Subcapsular hyperenhancing lesion of the lateral inferior right lobe of the liver, hepatic segment VI is only very faintly appreciated on this portal venous phase contrast examination, better characterized as a benign focal nodular hyperplasia by prior MR. 4. Diverticulosis.   04/11/2021 Surgery   Robotic-assisted laparoscopic total hysterectomy with bilateral salpingoophorectomy, SLN biopsy, omental biopsy, cystoscopy   Findings: On EUA, small mobile uterus. On intra-abdominal entry, normal upper abdominal survey. Normal omentum, small bowel. Large bowels with some filmy adhesions to the left pelvic sidewall and the left adhesions. Filmy adhesions also noted from the sigmoid to the posterior uterus (some of the adhesions biopsied). Uterus 8cm and normal in appearance. Atrophic  appearing adnexa. Mapping successful to bilateral SLNs. No obvious adenopathy. Minimal adhesions of the bladder to the LUS. No intra-abdominal or pelvic evidence of disease. Given concentrated appearing urine and low UOP, cystoscopy was performed. Bladder dome intact (bubbles seen). Good efflux noted from bilateral ureteral orifices.    04/11/2021 Pathologic Stage   Stage IA serous uterine cancer No LVI, negative SLNs Omentum negative, washings nondiagnostic (insufficient cellularity) HER2 negative   04/18/2021 Cancer Staging   Staging form: Corpus Uteri - Carcinoma and Carcinosarcoma, AJCC 8th Edition - Pathologic stage from 04/18/2021: FIGO Stage IA (pT1a, pN0, cM0) - Signed by Heath Lark, MD on 04/18/2021 Stage prefix: Initial diagnosis    05/05/2021 Procedure   Successful placement of a RIGHT internal jugular approach power injectable Port-A-Cath.   The tip of the catheter is positioned within the proximal RIGHT atrium. The catheter is ready for immediate use   05/09/2021 -  Chemotherapy   Patient is on Treatment Plan : UTERINE Carboplatin AUC 6 / Paclitaxel q21d        PHYSICAL EXAMINATION: ECOG PERFORMANCE STATUS: 1 - Symptomatic but completely ambulatory  Vitals:   08/22/21 1031  BP: (!) 159/73  Pulse: (!) 101  Resp: 18  Temp: (!) 97.4 F (36.3 C)  SpO2: 100%   Filed Weights   08/22/21 1030 08/22/21 1031  Weight: 154 lb 4 oz (70 kg) 154 lb 6.4 oz (70 kg)    GENERAL:alert, no distress and comfortable NEURO: alert & oriented x 3 with fluent speech, no focal motor/sensory deficits  LABORATORY DATA:  I have reviewed the data as listed    Component Value Date/Time   NA 139 08/22/2021 1012   NA 145 (H) 05/06/2019 1021   NA 141 10/29/2016 0742   K 3.8 08/22/2021 1012   K 4.1 10/29/2016 0742   CL 106 08/22/2021 1012   CL 108 (H) 04/28/2012 1008   CO2 25 08/22/2021 1012   CO2 26 10/29/2016 0742   GLUCOSE 133 (H) 08/22/2021 1012   GLUCOSE 95 10/29/2016 0742    GLUCOSE 99 04/28/2012 1008   BUN 16 08/22/2021 1012   BUN 15 05/06/2019 1021   BUN 21.1 10/29/2016 0742   CREATININE 0.67 08/22/2021 1012   CREATININE 0.9 10/29/2016 0742   CALCIUM 9.6 08/22/2021 1012   CALCIUM 9.0 10/29/2016 0742   PROT 7.1 08/22/2021 1012   PROT 6.4 05/06/2019 1021   PROT 6.6 10/29/2016 0742   ALBUMIN 4.0 08/22/2021 1012   ALBUMIN 3.4 (L) 10/29/2016 0742   AST 19 08/22/2021 1012   AST 15 10/29/2016 0742   ALT 15 08/22/2021 1012   ALT 12  10/29/2016 0742   ALKPHOS 83 08/22/2021 1012   ALKPHOS 49 10/29/2016 0742   BILITOT 0.3 08/22/2021 1012   BILITOT 0.32 10/29/2016 0742   GFRNONAA >60 08/22/2021 1012   GFRAA >60 12/18/2019 1114   GFRAA >60 07/15/2019 0847    No results found for: SPEP, UPEP  Lab Results  Component Value Date   WBC 3.3 (L) 08/22/2021   NEUTROABS 2.6 08/22/2021   HGB 10.2 (L) 08/22/2021   HCT 29.9 (L) 08/22/2021   MCV 96.8 08/22/2021   PLT 190 08/22/2021      Chemistry      Component Value Date/Time   NA 139 08/22/2021 1012   NA 145 (H) 05/06/2019 1021   NA 141 10/29/2016 0742   K 3.8 08/22/2021 1012   K 4.1 10/29/2016 0742   CL 106 08/22/2021 1012   CL 108 (H) 04/28/2012 1008   CO2 25 08/22/2021 1012   CO2 26 10/29/2016 0742   BUN 16 08/22/2021 1012   BUN 15 05/06/2019 1021   BUN 21.1 10/29/2016 0742   CREATININE 0.67 08/22/2021 1012   CREATININE 0.9 10/29/2016 0742      Component Value Date/Time   CALCIUM 9.6 08/22/2021 1012   CALCIUM 9.0 10/29/2016 0742   ALKPHOS 83 08/22/2021 1012   ALKPHOS 49 10/29/2016 0742   AST 19 08/22/2021 1012   AST 15 10/29/2016 0742   ALT 15 08/22/2021 1012   ALT 12 10/29/2016 0742   BILITOT 0.3 08/22/2021 1012   BILITOT 0.32 10/29/2016 0742

## 2021-08-22 NOTE — Patient Instructions (Signed)
[facility] MEDICAL ONCOLOGY  Discharge Instructions: Thank you for choosing [facility] to provide your oncology and hematology care.   If you have a lab appointment with the Cancer Center, please go directly to the Cancer Center and check in at the registration area.   Wear comfortable clothing and clothing appropriate for easy access to any Portacath or PICC line.   We strive to give you quality time with your provider. You may need to reschedule your appointment if you arrive late (15 or more minutes).  Arriving late affects you and other patients whose appointments are after yours.  Also, if you miss three or more appointments without notifying the office, you may be dismissed from the clinic at the provider's discretion.      For prescription refill requests, have your pharmacy contact our office and allow 72 hours for refills to be completed.    Today you received the following chemotherapy and/or immunotherapy agents: Taxol & Carboplatin   To help prevent nausea and vomiting after your treatment, we encourage you to take your nausea medication as directed.  BELOW ARE SYMPTOMS THAT SHOULD BE REPORTED IMMEDIATELY: *FEVER GREATER THAN 100.4 F (38 C) OR HIGHER *CHILLS OR SWEATING *NAUSEA AND VOMITING THAT IS NOT CONTROLLED WITH YOUR NAUSEA MEDICATION *UNUSUAL SHORTNESS OF BREATH *UNUSUAL BRUISING OR BLEEDING *URINARY PROBLEMS (pain or burning when urinating, or frequent urination) *BOWEL PROBLEMS (unusual diarrhea, constipation, pain near the anus) TENDERNESS IN MOUTH AND THROAT WITH OR WITHOUT PRESENCE OF ULCERS (sore throat, sores in mouth, or a toothache) UNUSUAL RASH, SWELLING OR PAIN  UNUSUAL VAGINAL DISCHARGE OR ITCHING   Items with * indicate a potential emergency and should be followed up as soon as possible or go to the Emergency Department if any problems should occur.  Please show the CHEMOTHERAPY ALERT CARD or IMMUNOTHERAPY ALERT CARD at  check-in to the Emergency Department and triage nurse.  Should you have questions after your visit or need to cancel or reschedule your appointment, please contact [facility]  Dept: [phone]  and follow the prompts.  Office hours are 8:00 a.m. to 4:30 p.m. Monday - Friday. Please note that voicemails left after 4:00 p.m. may not be returned until the following business day.  We are closed weekends and major holidays. You have access to a nurse at all times for urgent questions. Please call the main number to the clinic Dept: [phone] and follow the prompts.   For any non-urgent questions, you may also contact your provider using MyChart. We now offer e-Visits for anyone 18 and older to request care online for non-urgent symptoms. For details visit [email or web address].   Also download the MyChart app! Go to the app store, search "MyChart", open the app, select [facility], and log in with your MyChart username and password.  Due to Covid, a mask is required upon entering the hospital/clinic. If you do not have a mask, one will be given to you upon arrival. For doctor visits, patients may have 1 support person aged 18 or older with them. For treatment visits, patients cannot have anyone with them due to current Covid guidelines and our immunocompromised population.   

## 2021-08-22 NOTE — Assessment & Plan Note (Signed)
This is likely due to recent treatment. The patient denies recent history of bleeding such as epistaxis, hematuria or hematochezia.   We will proceed with treatment without delay

## 2021-08-23 ENCOUNTER — Ambulatory Visit: Payer: Medicare Other | Admitting: Radiation Oncology

## 2021-09-13 ENCOUNTER — Encounter: Payer: Self-pay | Admitting: Radiation Oncology

## 2021-09-13 NOTE — Progress Notes (Incomplete)
  Radiation Oncology         [phone] ________________________________  Patient Name: [name] MRN: [id number] DOB: 04-May-1950 Referring Physician: Jeral Pinch Date of Service: 08/17/2021 [facility], [place]                                                        End Of Treatment Note  Diagnoses: 573.9-Malignant neoplasm of breast (female) unspecified site C54.1-Malignant neoplasm of endometrium  Cancer Staging: The encounter diagnosis was Endometrial carcinoma ([place]).   FIGO Stage IA (pT1a, pN0, cM0) Endometrial Cancer, high-grade serous  Intent: Curative  Radiation Treatment Dates: 07/27/2021 through 08/17/2021 Site Technique Total Dose (Gy) Dose per Fx (Gy) Completed Fx Beam Energies  Vagina: Pelvis HDR-brachy 30/30 6 5/5 Ir-192   Narrative: Overall she tolerated her vaginal brachytherapy extremely well.  She will receive her last cycle of chemotherapy next week.  She is looking forward to completing all of her adjuvant treatment.  Plan: The patient will follow-up with radiation oncology in one month .  ________________________________________________ -----------------------------------  [name], PhD, MD  This document serves as a record of services personally performed by Gery Pray, MD. It was created on his behalf by Roney Mans, a trained medical scribe. The creation of this record is based on the scribe's personal observations and the provider's statements to them. This document has been checked and approved by the attending provider.

## 2021-09-18 ENCOUNTER — Ambulatory Visit
Admission: RE | Admit: 2021-09-18 | Discharge: 2021-09-18 | Disposition: A | Discharge status: Home / Self Care | Payer: Medicare Other | Source: Ambulatory Visit | Attending: Radiation Oncology | Admitting: Radiation Oncology

## 2021-09-18 ENCOUNTER — Telehealth: Payer: Self-pay | Admitting: *Deleted

## 2021-09-18 ENCOUNTER — Encounter: Payer: Self-pay | Admitting: Radiation Oncology

## 2021-09-18 ENCOUNTER — Other Ambulatory Visit: Payer: Self-pay

## 2021-09-18 ENCOUNTER — Telehealth: Payer: Self-pay

## 2021-09-18 DIAGNOSIS — Z923 Personal history of irradiation: Secondary | ICD-10-CM | POA: Diagnosis not present

## 2021-09-18 DIAGNOSIS — Z79899 Other long term (current) drug therapy: Secondary | ICD-10-CM | POA: Diagnosis not present

## 2021-09-18 DIAGNOSIS — C541 Malignant neoplasm of endometrium: Secondary | ICD-10-CM | POA: Insufficient documentation

## 2021-09-18 DIAGNOSIS — R5383 Other fatigue: Secondary | ICD-10-CM | POA: Diagnosis not present

## 2021-09-18 DIAGNOSIS — G629 Polyneuropathy, unspecified: Secondary | ICD-10-CM | POA: Diagnosis not present

## 2021-09-18 HISTORY — DX: Personal history of irradiation: Z92.3

## 2021-09-20 ENCOUNTER — Other Ambulatory Visit: Payer: Self-pay

## 2021-09-20 ENCOUNTER — Inpatient Hospital Stay: Payer: Medicare Other | Attending: Gynecologic Oncology

## 2021-09-20 ENCOUNTER — Ambulatory Visit (HOSPITAL_COMMUNITY)
Admission: RE | Admit: 2021-09-20 | Discharge: 2021-09-20 | Disposition: A | Discharge status: Home / Self Care | Payer: Medicare Other | Source: Ambulatory Visit | Attending: Hematology and Oncology | Admitting: Hematology and Oncology

## 2021-09-20 DIAGNOSIS — J432 Centrilobular emphysema: Secondary | ICD-10-CM | POA: Diagnosis not present

## 2021-09-20 DIAGNOSIS — M8580 Other specified disorders of bone density and structure, unspecified site: Secondary | ICD-10-CM | POA: Insufficient documentation

## 2021-09-20 DIAGNOSIS — C541 Malignant neoplasm of endometrium: Secondary | ICD-10-CM | POA: Insufficient documentation

## 2021-09-20 DIAGNOSIS — Z85118 Personal history of other malignant neoplasm of bronchus and lung: Secondary | ICD-10-CM | POA: Insufficient documentation

## 2021-09-20 DIAGNOSIS — I700 Atherosclerosis of aorta: Secondary | ICD-10-CM | POA: Diagnosis not present

## 2021-09-20 DIAGNOSIS — G629 Polyneuropathy, unspecified: Secondary | ICD-10-CM | POA: Insufficient documentation

## 2021-09-20 LAB — CBC WITH DIFFERENTIAL (CANCER CENTER ONLY)
Abs Immature Granulocytes: 0 10*3/uL (ref 0.00–0.07)
Basophils Absolute: 0 10*3/uL (ref 0.0–0.1)
Basophils Relative: 0 %
Eosinophils Absolute: 0.1 10*3/uL (ref 0.0–0.5)
Eosinophils Relative: 3 %
HCT: 30.5 % — ABNORMAL LOW (ref 36.0–46.0)
Hemoglobin: 10.1 g/dL — ABNORMAL LOW (ref 12.0–15.0)
Immature Granulocytes: 0 %
Lymphocytes Relative: 35 %
Lymphs Abs: 1.1 10*3/uL (ref 0.7–4.0)
MCH: 33.4 pg (ref 26.0–34.0)
MCHC: 33.1 g/dL (ref 30.0–36.0)
MCV: 101 fL — ABNORMAL HIGH (ref 80.0–100.0)
Monocytes Absolute: 0.4 10*3/uL (ref 0.1–1.0)
Monocytes Relative: 14 %
Neutro Abs: 1.5 10*3/uL — ABNORMAL LOW (ref 1.7–7.7)
Neutrophils Relative %: 48 %
Platelet Count: 199 10*3/uL (ref 150–400)
RBC: 3.02 MIL/uL — ABNORMAL LOW (ref 3.87–5.11)
RDW: 16.3 % — ABNORMAL HIGH (ref 11.5–15.5)
WBC Count: 3.1 10*3/uL — ABNORMAL LOW (ref 4.0–10.5)
nRBC: 0 % (ref 0.0–0.2)

## 2021-09-20 LAB — CMP (CANCER CENTER ONLY)
ALT: 15 U/L (ref 0–44)
AST: 22 U/L (ref 15–41)
Albumin: 3.8 g/dL (ref 3.5–5.0)
Alkaline Phosphatase: 87 U/L (ref 38–126)
Anion gap: 8 (ref 5–15)
BUN: 14 mg/dL (ref 8–23)
CO2: 27 mmol/L (ref 22–32)
Calcium: 9.1 mg/dL (ref 8.9–10.3)
Chloride: 106 mmol/L (ref 98–111)
Creatinine: 0.72 mg/dL (ref 0.44–1.00)
GFR, Estimated: >60 mL/min (ref >60)
Glucose, Bld: 104 mg/dL — ABNORMAL HIGH (ref 70–99)
Potassium: 3.9 mmol/L (ref 3.5–5.1)
Sodium: 141 mmol/L (ref 135–145)
Total Bilirubin: 0.3 mg/dL (ref 0.3–1.2)
Total Protein: 6.6 g/dL (ref 6.5–8.1)

## 2021-09-20 MED ORDER — SODIUM CHLORIDE 0.9% FLUSH
10.0000 mL | Freq: Once | INTRAVENOUS | Status: AC
  Administered 2021-09-20: 10 mL

## 2021-09-20 MED ORDER — IOHEXOL 300 MG/ML  SOLN
100.0000 mL | Freq: Once | INTRAMUSCULAR | Status: AC | PRN
  Administered 2021-09-20: 100 mL via INTRAVENOUS

## 2021-09-20 MED ORDER — HEPARIN SOD (PORK) LOCK FLUSH 100 UNIT/ML IV SOLN: 500.0000 [IU] | Freq: Once | INTRAVENOUS | Status: DC

## 2021-09-20 MED ORDER — HEPARIN SOD (PORK) LOCK FLUSH 100 UNIT/ML IV SOLN
INTRAVENOUS | Status: AC
  Filled 2021-09-20: qty 5

## 2021-09-20 MED ORDER — SODIUM CHLORIDE (PF) 0.9 % IJ SOLN
INTRAMUSCULAR | Status: AC
  Filled 2021-09-20: qty 50

## 2021-09-22 ENCOUNTER — Encounter: Payer: Self-pay | Admitting: Hematology and Oncology

## 2021-09-22 ENCOUNTER — Other Ambulatory Visit: Payer: Self-pay

## 2021-09-22 ENCOUNTER — Inpatient Hospital Stay (HOSPITAL_BASED_OUTPATIENT_CLINIC_OR_DEPARTMENT_OTHER): Payer: Medicare Other | Admitting: Hematology and Oncology

## 2021-09-22 VITALS — BP 151/72 | HR 83 | Temp 97.4°F | Resp 18 | Ht 63.0 in | Wt 154.8 lb

## 2021-09-22 DIAGNOSIS — Z85118 Personal history of other malignant neoplasm of bronchus and lung: Secondary | ICD-10-CM

## 2021-09-22 DIAGNOSIS — C541 Malignant neoplasm of endometrium: Secondary | ICD-10-CM

## 2021-09-22 NOTE — Assessment & Plan Note (Signed)
She has remote history of lung cancer She is noted to have changes on the left upper lobe consistent with mucous plugging Per radiologist recommendation, repeat CT imaging in 3 to 6 months is recommended Per patient request, I will relay the message to Dr. Julien Nordmann, her primary oncologist for this

## 2021-09-22 NOTE — Progress Notes (Signed)
[place] OFFICE PROGRESS NOTE  Patient Care Team: [name], MD as PCP - General (Family Medicine)  ASSESSMENT & PLAN:  Endometrial carcinoma [facility]) I have reviewed imaging study with the patient The fluid collection in the left pelvic wall is seroma related She does not need long-term imaging She has appointment to follow-up with GYN oncologist and radiation oncologist for future follow-up I recommend port removal  History of lung cancer She has remote history of lung cancer She is noted to have changes on the left upper lobe consistent with mucous plugging Per radiologist recommendation, repeat CT imaging in 3 to 6 months is recommended Per patient request, I will relay the message to Dr. Julien Nordmann, her primary oncologist for this  Orders Placed This Encounter  Procedures   IR REMOVAL TUN ACCESS W/ PORT W/O FL MOD SED    Standing Status:   Future    Standing Expiration Date:   09/23/2022    Order Specific Question:   Reason for exam:    Answer:   no need port    Order Specific Question:   Preferred Imaging Location?    Answer:   [facility]    All questions were answered. The patient knows to call the clinic with any problems, questions or concerns. The total time spent in the appointment was 20 minutes encounter with patients including review of chart and various tests results, discussions about plan of care and coordination of care plan   Heath Lark, MD 09/22/2021 8:51 AM  INTERVAL HISTORY: Please see below for problem oriented charting. she returns for follow-up on test results She is doing well She has mild residual neuropathy but it does not bother her  REVIEW OF SYSTEMS:   Constitutional: Denies fevers, chills or abnormal weight loss Eyes: Denies blurriness of vision Ears, nose, mouth, throat, and face: Denies mucositis or sore throat Respiratory: Denies cough, dyspnea or wheezes Cardiovascular: Denies palpitation, chest discomfort or  lower extremity swelling Gastrointestinal:  Denies nausea, heartburn or change in bowel habits Skin: Denies abnormal skin rashes Lymphatics: Denies new lymphadenopathy or easy bruising Behavioral/Psych: Mood is stable, no new changes  All other systems were reviewed with the patient and are negative.  I have reviewed the past medical history, past surgical history, social history and family history with the patient and they are unchanged from previous note.  ALLERGIES:  is allergic to augmentin [amoxicillin-pot clavulanate], morphine and related, amoxicillin, asa [aspirin], banana, ceftin [cefuroxime axetil], citrus, lactose intolerance (gi), orange oil, penicillins, cefuroxime, clavulanic acid, tetanus-diphth-acell pertussis, and tape.  MEDICATIONS:  Current Outpatient Medications  Medication Sig Dispense Refill   acetaminophen (TYLENOL) 500 MG tablet Take 1,000 mg by mouth at bedtime as needed (Arthritis pain).     cycloSPORINE (RESTASIS) 0.05 % ophthalmic emulsion Place 1 drop into both eyes 2 (two) times daily.      esomeprazole (NEXIUM) 20 MG capsule Take 20 mg by mouth daily.     fluocinonide gel (LIDEX) 3.30 % Apply 1 application topically daily as needed (canker sores).      fluticasone (FLONASE) 50 MCG/ACT nasal spray Place 2 sprays into both nostrils daily.      Lactobacillus (ACIDOPHILUS PO) Take 5 mg by mouth daily.     Multiple Vitamin (MULTIVITAMIN) capsule Take 1 capsule by mouth in the morning.      Olopatadine HCl 0.2 % SOLN Place 1 drop into both eyes daily as needed (Allergies/Seasonal).     Polyethyl Glycol-Propyl Glycol (SYSTANE) 0.4-0.3 %  SOLN Place 1 drop into both eyes daily as needed (Dry eye).     vitamin E 400 UNIT capsule Take 400 Units by mouth daily.      No current facility-administered medications for this visit.    SUMMARY OF ONCOLOGIC HISTORY: Oncology History Overview Note  CA-125 on 04/06/21: 10.2 High grade serous, Her 2 neg Genetics from 04/30/18:  VUS   MMR IHC intact   History of lung cancer (Resolved)  04/28/2012 Initial Diagnosis   Lung cancer, diagnosed in March of 2006 stage II a status post left trisegmentectomy with lymph node dissection followed by 4 cycles of adjuvant chemotherapy   05/06/2018 Genetic Testing   MEN1 c.875C>T and MSH3 c.1366G>A VUS identified on the multicancer gene panel.  The Multi-Gene Panel offered by [facility] includes sequencing and/or deletion duplication testing of the following 84 genes: AIP, ALK, APC, ATM, AXIN2,BAP1,  BARD1, BLM, BMPR1A, BRCA1, BRCA2, BRIP1, CASR, CDC73, CDH1, CDK4, CDKN1B, CDKN1C, CDKN2A (p14ARF), CDKN2A (p16INK4a), CEBPA, CHEK2, CTNNA1, DICER1, DIS3L2, EGFR (c.2369C>T, p.Thr790Met variant only), EPCAM (Deletion/duplication testing only), FH, FLCN, GATA2, GPC3, GREM1 (Promoter region deletion/duplication testing only), HOXB13 (c.251G>A, p.Gly84Glu), HRAS, KIT, MAX, MEN1, MET, MITF (c.952G>A, p.Glu318Lys variant only), MLH1, MSH2, MSH3, MSH6, MUTYH, NBN, NF1, NF2, NTHL1, PALB2, PDGFRA, PHOX2B, PMS2, POLD1, POLE, POT1, PRKAR1A, PTCH1, PTEN, RAD50, RAD51C, RAD51D, RB1, RECQL4, RET, RUNX1, SDHAF2, SDHA (sequence changes only), SDHB, SDHC, SDHD, SMAD4, SMARCA4, SMARCB1, SMARCE1, STK11, SUFU, TERC, TERT, TMEM127, TP53, TSC1, TSC2, VHL, WRN and WT1.  The report date is May 06, 2018.   History of ductal carcinoma in situ (DCIS) of breast  04/28/2012 Initial Diagnosis   Ductal carcinoma in situ of right breast diagnosed in December of 2013, status post right lumpectomy in January of 2014   05/06/2018 Genetic Testing   MEN1 c.875C>T and MSH3 c.1366G>A VUS identified on the multicancer gene panel.  The Multi-Gene Panel offered by [facility] includes sequencing and/or deletion duplication testing of the following 84 genes: AIP, ALK, APC, ATM, AXIN2,BAP1,  BARD1, BLM, BMPR1A, BRCA1, BRCA2, BRIP1, CASR, CDC73, CDH1, CDK4, CDKN1B, CDKN1C, CDKN2A (p14ARF), CDKN2A (p16INK4a), CEBPA, CHEK2, CTNNA1, DICER1,  DIS3L2, EGFR (c.2369C>T, p.Thr790Met variant only), EPCAM (Deletion/duplication testing only), FH, FLCN, GATA2, GPC3, GREM1 (Promoter region deletion/duplication testing only), HOXB13 (c.251G>A, p.Gly84Glu), HRAS, KIT, MAX, MEN1, MET, MITF (c.952G>A, p.Glu318Lys variant only), MLH1, MSH2, MSH3, MSH6, MUTYH, NBN, NF1, NF2, NTHL1, PALB2, PDGFRA, PHOX2B, PMS2, POLD1, POLE, POT1, PRKAR1A, PTCH1, PTEN, RAD50, RAD51C, RAD51D, RB1, RECQL4, RET, RUNX1, SDHAF2, SDHA (sequence changes only), SDHB, SDHC, SDHD, SMAD4, SMARCA4, SMARCB1, SMARCE1, STK11, SUFU, TERC, TERT, TMEM127, TP53, TSC1, TSC2, VHL, WRN and WT1.  The report date is May 06, 2018.   Endometrial carcinoma ([place])  01/27/2021 Initial Diagnosis   Patient began having vaginal spotting on November 10th.  She had 4 days of spotting which she describes as seeing red when she wiped and having a little brown discharge on her liner.  Bleeding then stopped completely.  She saw her primary care provider and underwent an ultrasound.  Ultrasound showed thickened endometrium measuring 14 mm.  She then started bleeding again on December 14 and had a week of bleeding with a little bit more red blood this time.  She ultimately saw OB/GYN and had an endometrial biopsy.  This was first attempted however secondary to cervical stenosis, patient returned the following day after using intravaginal misoprostol.  Endometrial biopsy on 12/22 revealed FIGO grade 2/3 endometrial cancer.   02/27/2021 Imaging   US pelvis Thickened endometrium with possible 14  mm endometrial mass. In the setting of post-menopausal bleeding, endometrial sampling is indicated to exclude carcinoma   03/06/2021 Initial Biopsy   Endometrial biopsy revealed FIGO grade 2/3 endometrial cancer   03/24/2021 Initial Diagnosis   Endometrial carcinoma ([place])   03/30/2021 Imaging   CT A/P: 1. Abnormal thickening of the endometrium, as seen on prior ultrasound and consistent with recently diagnosed  endometrial malignancy. 2. No evidence of lymphadenopathy or metastatic disease in the abdomen or pelvis. 3. Subcapsular hyperenhancing lesion of the lateral inferior right lobe of the liver, hepatic segment VI is only very faintly appreciated on this portal venous phase contrast examination, better characterized as a benign focal nodular hyperplasia by prior MR. 4. Diverticulosis.   04/11/2021 Surgery   Robotic-assisted laparoscopic total hysterectomy with bilateral salpingoophorectomy, SLN biopsy, omental biopsy, cystoscopy   Findings: On EUA, small mobile uterus. On intra-abdominal entry, normal upper abdominal survey. Normal omentum, small bowel. Large bowels with some filmy adhesions to the left pelvic sidewall and the left adhesions. Filmy adhesions also noted from the sigmoid to the posterior uterus (some of the adhesions biopsied). Uterus 8cm and normal in appearance. Atrophic appearing adnexa. Mapping successful to bilateral SLNs. No obvious adenopathy. Minimal adhesions of the bladder to the LUS. No intra-abdominal or pelvic evidence of disease. Given concentrated appearing urine and low UOP, cystoscopy was performed. Bladder dome intact (bubbles seen). Good efflux noted from bilateral ureteral orifices.    04/11/2021 Pathologic Stage   Stage IA serous uterine cancer No LVI, negative SLNs Omentum negative, washings nondiagnostic (insufficient cellularity) HER2 negative   04/18/2021 Cancer Staging   Staging form: Corpus Uteri - Carcinoma and Carcinosarcoma, AJCC 8th Edition - Pathologic stage from 04/18/2021: FIGO Stage IA (pT1a, pN0, cM0) - Signed by Heath Lark, MD on 04/18/2021 Stage prefix: Initial diagnosis   05/05/2021 Procedure   Successful placement of a RIGHT internal jugular approach power injectable Port-A-Cath.   The tip of the catheter is positioned within the proximal RIGHT atrium. The catheter is ready for immediate use   05/09/2021 - 08/22/2021 Chemotherapy   Patient is  on Treatment Plan : UTERINE Carboplatin AUC 6 / Paclitaxel q21d     09/21/2021 Imaging   CT CHEST IMPRESSION   1. Left upper lobe surgical changes. Increased branching soft tissue density compared to 2016. Most likely related to progressive mucoid impaction. Locally recurrent neoplasm is felt unlikely, especially given remote lung cancer history. This could be re-evaluated with chest CT at 3 to 6 months. PET would likely be unhelpful, as either infectious mucoid impaction or local recurrence would be tracer avid. 2. No typical findings of uterine cancer metastatic disease within the chest. 3. Aortic atherosclerosis (ICD10-I70.0), coronary artery atherosclerosis and emphysema (ICD10-J43.9).   CT ABDOMEN AND PELVIS IMPRESSION   1. Interval hysterectomy, without findings of residual or metastatic disease. 2. Apparent gastric wall thickening could be due to underdistention. Correlate with symptoms of gastritis. 3. Left pelvic sidewall seroma/lymphangioma.         PHYSICAL EXAMINATION: ECOG PERFORMANCE STATUS: 0 - Asymptomatic  Vitals:   09/22/21 0812  BP: (!) 151/72  Pulse: 83  Resp: 18  Temp: (!) 97.4 F (36.3 C)  SpO2: 100%   Filed Weights   09/22/21 0812  Weight: 154 lb 12.8 oz (70.2 kg)    GENERAL:alert, no distress and comfortable NEURO: alert & oriented x 3 with fluent speech, no focal motor/sensory deficits  LABORATORY DATA:  I have reviewed the data as listed    Component  Value Date/Time   NA 141 09/20/2021 0754   NA 145 (H) 05/06/2019 1021   NA 141 10/29/2016 0742   K 3.9 09/20/2021 0754   K 4.1 10/29/2016 0742   CL 106 09/20/2021 0754   CL 108 (H) 04/28/2012 1008   CO2 27 09/20/2021 0754   CO2 26 10/29/2016 0742   GLUCOSE 104 (H) 09/20/2021 0754   GLUCOSE 95 10/29/2016 0742   GLUCOSE 99 04/28/2012 1008   BUN 14 09/20/2021 0754   BUN 15 05/06/2019 1021   BUN 21.1 10/29/2016 0742   CREATININE 0.72 09/20/2021 0754   CREATININE 0.9 10/29/2016 0742    CALCIUM 9.1 09/20/2021 0754   CALCIUM 9.0 10/29/2016 0742   PROT 6.6 09/20/2021 0754   PROT 6.4 05/06/2019 1021   PROT 6.6 10/29/2016 0742   ALBUMIN 3.8 09/20/2021 0754   ALBUMIN 3.4 (L) 10/29/2016 0742   AST 22 09/20/2021 0754   AST 15 10/29/2016 0742   ALT 15 09/20/2021 0754   ALT 12 10/29/2016 0742   ALKPHOS 87 09/20/2021 0754   ALKPHOS 49 10/29/2016 0742   BILITOT 0.3 09/20/2021 0754   BILITOT 0.32 10/29/2016 0742   GFRNONAA >60 09/20/2021 0754   GFRAA >60 12/18/2019 1114   GFRAA >60 07/15/2019 0847    No results found for: "SPEP", "UPEP"  Lab Results  Component Value Date   WBC 3.1 (L) 09/20/2021   NEUTROABS 1.5 (L) 09/20/2021   HGB 10.1 (L) 09/20/2021   HCT 30.5 (L) 09/20/2021   MCV 101.0 (H) 09/20/2021   PLT 199 09/20/2021      Chemistry      Component Value Date/Time   NA 141 09/20/2021 0754   NA 145 (H) 05/06/2019 1021   NA 141 10/29/2016 0742   K 3.9 09/20/2021 0754   K 4.1 10/29/2016 0742   CL 106 09/20/2021 0754   CL 108 (H) 04/28/2012 1008   CO2 27 09/20/2021 0754   CO2 26 10/29/2016 0742   BUN 14 09/20/2021 0754   BUN 15 05/06/2019 1021   BUN 21.1 10/29/2016 0742   CREATININE 0.72 09/20/2021 0754   CREATININE 0.9 10/29/2016 0742      Component Value Date/Time   CALCIUM 9.1 09/20/2021 0754   CALCIUM 9.0 10/29/2016 0742   ALKPHOS 87 09/20/2021 0754   ALKPHOS 49 10/29/2016 0742   AST 22 09/20/2021 0754   AST 15 10/29/2016 0742   ALT 15 09/20/2021 0754   ALT 12 10/29/2016 0742   BILITOT 0.3 09/20/2021 0754   BILITOT 0.32 10/29/2016 0742       RADIOGRAPHIC STUDIES: I have reviewed multiple imaging studies with the patient I have personally reviewed the radiological images as listed and agreed with the findings in the report. CT CHEST ABDOMEN PELVIS W CONTRAST  Result Date: 09/20/2021 CLINICAL DATA:  Uterine/cervical cancer. Evaluate treatment response. Diagnosed in 2022 with radiation therapy and chemotherapy complete in May. Lung cancer  17 years ago. Right breast cancer nine years ago. Irritable bowel syndrome and diarrhea. * Tracking Code: BO * EXAM: CT CHEST, ABDOMEN, AND PELVIS WITH CONTRAST TECHNIQUE: Multidetector CT imaging of the chest, abdomen and pelvis was performed following the standard protocol during bolus administration of intravenous contrast. RADIATION DOSE REDUCTION: This exam was performed according to the departmental dose-optimization program which includes automated exposure control, adjustment of the mA and/or kV according to patient size and/or use of iterative reconstruction technique. CONTRAST:  135m OMNIPAQUE IOHEXOL 300 MG/ML  SOLN COMPARISON:  Abdominopelvic CT 03/30/2021.  Chest CT 04/20/2014 FINDINGS: CT CHEST FINDINGS Cardiovascular: Right Port-A-Cath tip low SVC. Aortic atherosclerosis. Normal heart size, without pericardial effusion. Multivessel coronary artery atherosclerosis. No central pulmonary embolism, on this non-dedicated study. Mediastinum/Nodes: No supraclavicular adenopathy. Hypoattenuating right thyroid nodule of 1.8 cm on 06/02 has been present back to 2016, favoring a benign etiology. This was also evaluated 2019 dedicated ultrasound. No mediastinal or hilar adenopathy. No axillary or internal mammary adenopathy. Lungs/Pleura: No pleural fluid. Surgical changes within the left upper lobe. Moderate centrilobular emphysema. Mild radiation induced subpleural fibrosis within the anterior right lung. Increased branching opacities within the left upper lobe, including at 6 mm on 36/4 and at 1.0 x 1.6 cm on 60/4. Musculoskeletal: Right breast implant. No acute osseous abnormality. Cervical spine fixation. CT ABDOMEN PELVIS FINDINGS Hepatobiliary: Normal liver. Normal gallbladder, without biliary ductal dilatation. Pancreas: Normal, without mass or ductal dilatation. Spleen: Normal in size, without focal abnormality. Adrenals/Urinary Tract: Normal adrenal glands. Left renal sinus cysts. Normal right kidney.  No hydronephrosis. Normal urinary bladder. Stomach/Bowel: The proximal stomach appears thick walled, but is underdistended. 2.0 cm on 52/2. Scattered colonic diverticula. Normal terminal ileum. Normal small bowel. Vascular/Lymphatic: Aortic atherosclerosis. No abdominopelvic adenopathy. Left external iliac 2.5 x 3.5 cm fluid density structure is new and presumably a postoperative seroma or lymphangioma. Reproductive: Interval hysterectomy.  No adnexal mass. Other: No significant free fluid. Mild pelvic floor laxity. No evidence of omental or peritoneal disease. Musculoskeletal: Mild osteopenia. Trace L4-5 anterolisthesis with lower lumbar disc bulges. IMPRESSION: CT CHEST IMPRESSION 1. Left upper lobe surgical changes. Increased branching soft tissue density compared to 2016. Most likely related to progressive mucoid impaction. Locally recurrent neoplasm is felt unlikely, especially given remote lung cancer history. This could be re-evaluated with chest CT at 3 to 6 months. PET would likely be unhelpful, as either infectious mucoid impaction or local recurrence would be tracer avid. 2. No typical findings of uterine cancer metastatic disease within the chest. 3. Aortic atherosclerosis (ICD10-I70.0), coronary artery atherosclerosis and emphysema (ICD10-J43.9). CT ABDOMEN AND PELVIS IMPRESSION 1. Interval hysterectomy, without findings of residual or metastatic disease. 2. Apparent gastric wall thickening could be due to underdistention. Correlate with symptoms of gastritis. 3. Left pelvic sidewall seroma/lymphangioma. Electronically Signed   By: Abigail Miyamoto M.D.   On: 09/20/2021 17:01

## 2021-09-22 NOTE — Assessment & Plan Note (Signed)
I have reviewed imaging study with the patient The fluid collection in the left pelvic wall is seroma related She does not need long-term imaging She has appointment to follow-up with GYN oncologist and radiation oncologist for future follow-up I recommend port removal

## 2021-11-09 ENCOUNTER — Encounter: Payer: Self-pay | Admitting: Gynecologic Oncology

## 2021-11-13 ENCOUNTER — Inpatient Hospital Stay: Payer: Medicare Other | Attending: Gynecologic Oncology | Admitting: Gynecologic Oncology

## 2021-11-13 ENCOUNTER — Other Ambulatory Visit: Payer: Self-pay

## 2021-11-13 VITALS — BP 132/81 | HR 84 | Temp 98.1°F | Resp 16 | Wt 154.5 lb

## 2021-11-13 DIAGNOSIS — C541 Malignant neoplasm of endometrium: Secondary | ICD-10-CM | POA: Diagnosis present

## 2021-11-13 DIAGNOSIS — Z90710 Acquired absence of both cervix and uterus: Secondary | ICD-10-CM | POA: Diagnosis not present

## 2021-11-13 DIAGNOSIS — Z923 Personal history of irradiation: Secondary | ICD-10-CM | POA: Diagnosis not present

## 2021-11-13 DIAGNOSIS — Z9221 Personal history of antineoplastic chemotherapy: Secondary | ICD-10-CM | POA: Insufficient documentation

## 2021-11-13 DIAGNOSIS — Z90722 Acquired absence of ovaries, bilateral: Secondary | ICD-10-CM | POA: Diagnosis not present

## 2021-11-13 NOTE — Progress Notes (Signed)
Gynecologic Oncology Return Clinic Visit  11/13/2021  Reason for Visit: Surveillance in the setting of uterine cancer  Treatment History: Oncology History Overview Note  CA-125 on 04/06/21: 10.2 High grade serous, Her 2 neg Genetics from 04/30/18: VUS   MMR IHC intact   History of lung cancer (Resolved)  04/28/2012 Initial Diagnosis   Lung cancer, diagnosed in March of 2006 stage II a status post left trisegmentectomy with lymph node dissection followed by 4 cycles of adjuvant chemotherapy   05/06/2018 Genetic Testing   MEN1 c.875C>T and MSH3 c.1366G>A VUS identified on the multicancer gene panel.  The Multi-Gene Panel offered by [facility] includes sequencing and/or deletion duplication testing of the following 84 genes: AIP, ALK, APC, ATM, AXIN2,BAP1,  BARD1, BLM, BMPR1A, BRCA1, BRCA2, BRIP1, CASR, CDC73, CDH1, CDK4, CDKN1B, CDKN1C, CDKN2A (p14ARF), CDKN2A (p16INK4a), CEBPA, CHEK2, CTNNA1, DICER1, DIS3L2, EGFR (c.2369C>T, p.Thr790Met variant only), EPCAM (Deletion/duplication testing only), FH, FLCN, GATA2, GPC3, GREM1 (Promoter region deletion/duplication testing only), HOXB13 (c.251G>A, p.Gly84Glu), HRAS, KIT, MAX, MEN1, MET, MITF (c.952G>A, p.Glu318Lys variant only), MLH1, MSH2, MSH3, MSH6, MUTYH, NBN, NF1, NF2, NTHL1, PALB2, PDGFRA, PHOX2B, PMS2, POLD1, POLE, POT1, PRKAR1A, PTCH1, PTEN, RAD50, RAD51C, RAD51D, RB1, RECQL4, RET, RUNX1, SDHAF2, SDHA (sequence changes only), SDHB, SDHC, SDHD, SMAD4, SMARCA4, SMARCB1, SMARCE1, STK11, SUFU, TERC, TERT, TMEM127, TP53, TSC1, TSC2, VHL, WRN and WT1.  The report date is May 06, 2018.   History of ductal carcinoma in situ (DCIS) of breast  04/28/2012 Initial Diagnosis   Ductal carcinoma in situ of right breast diagnosed in December of 2013, status post right lumpectomy in January of 2014   05/06/2018 Genetic Testing   MEN1 c.875C>T and MSH3 c.1366G>A VUS identified on the multicancer gene panel.  The Multi-Gene Panel offered by [facility] includes  sequencing and/or deletion duplication testing of the following 84 genes: AIP, ALK, APC, ATM, AXIN2,BAP1,  BARD1, BLM, BMPR1A, BRCA1, BRCA2, BRIP1, CASR, CDC73, CDH1, CDK4, CDKN1B, CDKN1C, CDKN2A (p14ARF), CDKN2A (p16INK4a), CEBPA, CHEK2, CTNNA1, DICER1, DIS3L2, EGFR (c.2369C>T, p.Thr790Met variant only), EPCAM (Deletion/duplication testing only), FH, FLCN, GATA2, GPC3, GREM1 (Promoter region deletion/duplication testing only), HOXB13 (c.251G>A, p.Gly84Glu), HRAS, KIT, MAX, MEN1, MET, MITF (c.952G>A, p.Glu318Lys variant only), MLH1, MSH2, MSH3, MSH6, MUTYH, NBN, NF1, NF2, NTHL1, PALB2, PDGFRA, PHOX2B, PMS2, POLD1, POLE, POT1, PRKAR1A, PTCH1, PTEN, RAD50, RAD51C, RAD51D, RB1, RECQL4, RET, RUNX1, SDHAF2, SDHA (sequence changes only), SDHB, SDHC, SDHD, SMAD4, SMARCA4, SMARCB1, SMARCE1, STK11, SUFU, TERC, TERT, TMEM127, TP53, TSC1, TSC2, VHL, WRN and WT1.  The report date is May 06, 2018.   Endometrial carcinoma ([place])  01/27/2021 Initial Diagnosis   Patient began having vaginal spotting on November 10th.  She had 4 days of spotting which she describes as seeing red when she wiped and having a little brown discharge on her liner.  Bleeding then stopped completely.  She saw her primary care provider and underwent an ultrasound.  Ultrasound showed thickened endometrium measuring 14 mm.  She then started bleeding again on December 14 and had a week of bleeding with a little bit more red blood this time.  She ultimately saw OB/GYN and had an endometrial biopsy.  This was first attempted however secondary to cervical stenosis, patient returned the following day after using intravaginal misoprostol.  Endometrial biopsy on 12/22 revealed FIGO grade 2/3 endometrial cancer.   02/27/2021 Imaging   US pelvis Thickened endometrium with possible 14 mm endometrial mass. In the setting of post-menopausal bleeding, endometrial sampling is indicated to exclude carcinoma   03/06/2021 Initial Biopsy   Endometrial biopsy  revealed FIGO grade 2/3 endometrial cancer   03/24/2021 Initial Diagnosis   Endometrial carcinoma ([place])   03/30/2021 Imaging   CT A/P: 1. Abnormal thickening of the endometrium, as seen on prior ultrasound and consistent with recently diagnosed endometrial malignancy. 2. No evidence of lymphadenopathy or metastatic disease in the abdomen or pelvis. 3. Subcapsular hyperenhancing lesion of the lateral inferior right lobe of the liver, hepatic segment VI is only very faintly appreciated on this portal venous phase contrast examination, better characterized as a benign focal nodular hyperplasia by prior MR. 4. Diverticulosis.   04/11/2021 Surgery   Robotic-assisted laparoscopic total hysterectomy with bilateral salpingoophorectomy, SLN biopsy, omental biopsy, cystoscopy   Findings: On EUA, small mobile uterus. On intra-abdominal entry, normal upper abdominal survey. Normal omentum, small bowel. Large bowels with some filmy adhesions to the left pelvic sidewall and the left adhesions. Filmy adhesions also noted from the sigmoid to the posterior uterus (some of the adhesions biopsied). Uterus 8cm and normal in appearance. Atrophic appearing adnexa. Mapping successful to bilateral SLNs. No obvious adenopathy. Minimal adhesions of the bladder to the LUS. No intra-abdominal or pelvic evidence of disease. Given concentrated appearing urine and low UOP, cystoscopy was performed. Bladder dome intact (bubbles seen). Good efflux noted from bilateral ureteral orifices.    04/11/2021 Pathologic Stage   Stage IA serous uterine cancer No LVI, negative SLNs Omentum negative, washings nondiagnostic (insufficient cellularity) HER2 negative   04/18/2021 Cancer Staging   Staging form: Corpus Uteri - Carcinoma and Carcinosarcoma, AJCC 8th Edition - Pathologic stage from 04/18/2021: FIGO Stage IA (pT1a, pN0, cM0) - Signed by Heath Lark, MD on 04/18/2021 Stage prefix: Initial diagnosis   05/05/2021 Procedure    Successful placement of a RIGHT internal jugular approach power injectable Port-A-Cath.   The tip of the catheter is positioned within the proximal RIGHT atrium. The catheter is ready for immediate use   05/09/2021 - 08/22/2021 Chemotherapy   Patient is on Treatment Plan : UTERINE Carboplatin AUC 6 / Paclitaxel q21d     07/27/2021 - 08/17/2021 Radiation Therapy   07/27/2021 through 08/17/2021 Site Technique Total Dose (Gy) Dose per Fx (Gy) Completed Fx Beam Energies  Vagina: Pelvis HDR-brachy 30/30 6 5/5 Ir-192       09/21/2021 Imaging   CT CHEST IMPRESSION   1. Left upper lobe surgical changes. Increased branching soft tissue density compared to 2016. Most likely related to progressive mucoid impaction. Locally recurrent neoplasm is felt unlikely, especially given remote lung cancer history. This could be re-evaluated with chest CT at 3 to 6 months. PET would likely be unhelpful, as either infectious mucoid impaction or local recurrence would be tracer avid. 2. No typical findings of uterine cancer metastatic disease within the chest. 3. Aortic atherosclerosis (ICD10-I70.0), coronary artery atherosclerosis and emphysema (ICD10-J43.9).   CT ABDOMEN AND PELVIS IMPRESSION   1. Interval hysterectomy, without findings of residual or metastatic disease. 2. Apparent gastric wall thickening could be due to underdistention. Correlate with symptoms of gastritis. 3. Left pelvic sidewall seroma/lymphangioma.         Interval History: Patient reports overall doing well.  Has recovered from chemotherapy.  He is seeing audiology tomorrow for symptoms of hearing loss and ear ringing.  Has some neuropathy that affects her hands and feet.  Notes some worsening of her arthritis since chemotherapy, especially in her knees and back.  She denies any abdominal or pelvic pain.  Reports normal bowel and bladder function since finishing chemotherapy.  Denies any vaginal bleeding or  discharge.  Has been using her  vaginal dilator.  Has some left lower extremity edema, mostly that involves the ankle and foot.  Treats this with ice and compression socks with good relief.  Past Medical/Surgical History: Past Medical History:  Diagnosis Date   Anxiety    Breast cancer ([place])    2014 and 2015   Cancer ([place])    lung cancer    DDD (degenerative disc disease)    neck   Dry eye    Eczema    Family history of breast cancer    Family history of stomach cancer    GERD (gastroesophageal reflux disease)    occasional - no current med.   Goiter    History of breast cancer    History of lung cancer    non-small cell - left upper lobe   History of radiation therapy    Vagina- 045/04/23-05/23/23- Dr. Gery Pray   IBS (irritable bowel syndrome)    Non-allergic rhinitis    sinusitis   Osteoarthritis    Personal history of radiation therapy 2014   PONV (postoperative nausea and vomiting)    Radiation 07/03/12-07/09/12   Right upper lung 5400 cGy   Thyroid nodule, toxic or with hyperthyroidism    no current med.   Urinary, incontinence, stress female    Vasomotor rhinitis    Vertigo     Past Surgical History:  Procedure Laterality Date   ANTERIOR CERVICAL DECOMP/DISCECTOMY FUSION N/A 12/22/2019   Procedure: Cervical four-five Cervical five-six Cervical six-seven Anterior cervical decompression/discectomy/fusion;  Surgeon: Erline Levine, MD;  Location: [facility];  Service: Neurosurgery;  Laterality: N/A;   APPENDECTOMY  1971   BREAST LUMPECTOMY Right 2014   BREAST RECONSTRUCTION WITH PLACEMENT OF TISSUE EXPANDER AND FLEX HD (ACELLULAR HYDRATED DERMIS) Right 05/20/2013   Procedure: IMMEDIATE RIGHT BREAST RECONSTRUCTION WITH LATISSAMUS MUSCLE FLAP AND PLACEMENT OF TISSUE EXPANDER TO RIGHT BREAST;  Surgeon: Theodoro Kos, DO;  Location: [facility];  Service: Plastics;  Laterality: Right;   BREAST REDUCTION WITH MASTOPEXY Left 10/22/2013   Procedure: BREAST REDUCTION/MASTOPEXY TO LEFT BREAST WITH LIPOSUCTION/FAT  GRAFTING FOR SYMMETRY;  Surgeon: Theodoro Kos, DO;  Location: [facility];  Service: Plastics;  Laterality: Left;   CYSTOSCOPY N/A 04/11/2021   Procedure: CYSTOSCOPY;  Surgeon: [name], MD;  Location: [facility];  Service: Gynecology;  Laterality: N/A;   DE QUERVAIN'S RELEASE Left 07/31/2002   FRACTURE SURGERY Left 2019   Torn meniscus; bone fragments removed   ganglion cyst R hand     1980s   IR IMAGING GUIDED PORT INSERTION  05/04/2021   IR US GUIDE VASC ACCESS RIGHT  05/04/2021   KNEE SURGERY Left    torn meniscus   LUNG REMOVAL, PARTIAL     2014   MASTECTOMY Right    2015   MASTECTOMY W/ SENTINEL NODE BIOPSY Right 05/20/2013   Procedure: RIGHT SIMPLEMASTECTOMY AND SENTINEL NODE   Sampling;  Surgeon: Marcello Moores A. Cornett, MD;  Location: [facility];  Service: General;  Laterality: Right;   MOUTH SURGERY Right may 2014   "keratocystic odotogenic tumor" - X 2   NASAL SINUS SURGERY  ?1988-1990   "at least 3" (05/21/2013)   PARTIAL MASTECTOMY WITH NEEDLE LOCALIZATION  04/10/2012   Procedure: PARTIAL MASTECTOMY WITH NEEDLE LOCALIZATION;  Surgeon: Marcello Moores A. Cornett, MD;  Location: [facility];  Service: General;  Laterality: Right;   REDUCTION MAMMAPLASTY Left 2015   REMOVAL OF TISSUE EXPANDER AND PLACEMENT OF IMPLANT Right 10/22/2013  Procedure: REMOVAL OF RIGHT TISSUE EXPANDER WITH PLACEMENT OF RIGHT BREAST IMPLANT;  Surgeon: Theodoro Kos, DO;  Location: [facility];  Service: Plastics;  Laterality: Right;   ROBOTIC ASSISTED BILATERAL SALPINGO OOPHERECTOMY Bilateral 04/11/2021   Procedure: XI ROBOTIC ASSISTED HYSTERECTOMY WITH BILATERAL SALPINGO OOPHORECTOMY; OMENTAL BIOPSY;  Surgeon: [name], MD;  Location: [facility];  Service: Gynecology;  Laterality: Bilateral;   SENTINEL NODE BIOPSY N/A 04/11/2021   Procedure: SENTINEL NODE BIOPSY;  Surgeon: [name], MD;  Location: [facility];  Service: Gynecology;  Laterality: N/A;   SEPTOPLASTY      & sinus surgeries 1980s-1990s   THORACOTOMY Left 07/14/2004   TRIGGER FINGER RELEASE Bilateral    "I've had OR on q finger" (05/21/2013)   TRIGGER FINGER RELEASE Right 07/14/1999   index and little fingers   TRIGGER FINGER RELEASE Right 07/17/2002   long and ring fingers   TRIGGER FINGER RELEASE Left 07/31/2002   little, ring and index fingers   VIDEO ASSISTED THORACOSCOPY (VATS)/ LOBECTOMY Left 07/14/2004   upper tri-segmentectomy   WISDOM TOOTH EXTRACTION     "3"    Family History  Problem Relation Age of Onset   Anesthesia problems Mother        post-op N/V   Thyroid nodules Mother    Other Mother        DDD, spinal stenosis   High blood pressure Mother    Hearing loss Mother    Leukemia Father        CML, d. 64s   High blood pressure Father    Hearing loss Father    Breast cancer Sister        BRCA testing in 2006 timeframe is neg   Thyroid nodules Sister    Thyroid nodules Sister    Hearing loss Brother    Breast cancer Maternal Aunt        dx >50   Thyroid nodules Maternal Aunt    Thyroid nodules Maternal Aunt    Hearing loss Paternal Aunt    Hearing loss Paternal Uncle    Stomach cancer Maternal Grandmother    Anesthesia problems Daughter        post-op N/V   Stomach cancer Maternal Great-grandmother        assumed stomach cancer   Prostate cancer Neg Hx    Pancreatic cancer Neg Hx    Endometrial cancer Neg Hx    Ovarian cancer Neg Hx    Colon cancer Neg Hx     Social History   Socioeconomic History   Marital status: Married    Spouse name: Not on file   Number of children: 1   Years of education: Not on file   Highest education level: Not on file  Occupational History    Employer: [employer]  Tobacco Use   Smoking status: Former    Types: Cigarettes    Quit date: 05/06/2004    Years since quitting: 17.5   Smokeless tobacco: Never  Vaping Use   Vaping Use: Never used  Substance and Sexual Activity   Alcohol use: Yes    Alcohol/week:  7.0 standard drinks of alcohol    Types: 7 Glasses of wine per week    Comment: 1 glass wine daily   Drug use: Never   Sexual activity: Not Currently  Other Topics Concern   Not on file  Social History Narrative   Lives at home with spouse   Right handed   Caffeine: 2-3  cups of coffee/day   Social Determinants of Health   Financial Resource Strain: Not on file  Food Insecurity: Not on file  Transportation Needs: Not on file  Physical Activity: Not on file  Stress: Not on file  Social Connections: Not on file    Current Medications:  Current Outpatient Medications:    acetaminophen (TYLENOL) 500 MG tablet, Take 1,000 mg by mouth at bedtime as needed (Arthritis pain)., Disp: , Rfl:    cycloSPORINE (RESTASIS) 0.05 % ophthalmic emulsion, Place 1 drop into both eyes 2 (two) times daily. , Disp: , Rfl:    esomeprazole (NEXIUM) 20 MG capsule, Take 20 mg by mouth daily., Disp: , Rfl:    fluocinonide gel (LIDEX) 9.48 %, Apply 1 application topically daily as needed (canker sores). , Disp: , Rfl:    fluticasone (FLONASE) 50 MCG/ACT nasal spray, Place 2 sprays into both nostrils daily. , Disp: , Rfl:    Lactobacillus (ACIDOPHILUS PO), Take 5 mg by mouth daily., Disp: , Rfl:    Multiple Vitamin (MULTIVITAMIN) capsule, Take 1 capsule by mouth in the morning. , Disp: , Rfl:    Olopatadine HCl 0.2 % SOLN, Place 1 drop into both eyes daily as needed (Allergies/Seasonal)., Disp: , Rfl:    Polyethyl Glycol-Propyl Glycol (SYSTANE) 0.4-0.3 % SOLN, Place 1 drop into both eyes daily as needed (Dry eye)., Disp: , Rfl:    vitamin E 400 UNIT capsule, Take 400 Units by mouth daily. , Disp: , Rfl:   Review of Systems: + Hearing loss, ringing in ears, swelling of left foot and leg, joint pain, back pain, numbness. Denies appetite changes, fevers, chills, fatigue, unexplained weight changes. Denies neck lumps or masses, mouth sores, or voice changes. Denies cough or wheezing.  Denies shortness of  breath. Denies chest pain or palpitations. Denies leg swelling. Denies abdominal distention, pain, blood in stools, constipation, diarrhea, nausea, vomiting, or early satiety. Denies pain with intercourse, dysuria, frequency, hematuria or incontinence. Denies hot flashes, pelvic pain, vaginal bleeding or vaginal discharge.   Denies itching, rash, or wounds. Denies dizziness, headaches, or seizures. Denies swollen lymph nodes or glands, denies easy bruising or bleeding. Denies anxiety, depression, confusion, or decreased concentration.  Physical Exam: BP 132/81 (BP Location: Right Arm, Patient Position: Sitting)   Pulse 84   Temp 98.1 F (36.7 C) (Oral)   Resp 16   Wt 154 lb 8 oz (70.1 kg)   SpO2 99%   BMI 27.37 kg/m  General: Alert, oriented, no acute distress.  HEENT: Normocephalic, atraumatic. Sclera anicteric.  Chest: Clear to auscultation bilaterally. No wheezes, rhonchi, or rales. Cardiovascular: Regular rate and rhythm, no murmurs, rubs, or gallops.  Abdomen: Normoactive bowel sounds. Soft, nondistended, nontender to palpation. No masses or hepatosplenomegaly appreciated.  Well-healed incisions. Extremities: Grossly normal range of motion. Warm, well perfused.  No edema of right lower extremity, left lower extremity with compression sock up to the knee. Skin: No rashes or lesions.  GU: Normal external female genitalia.  Speculum exam shows mildly atrophic vaginal mucosa, some radiation changes noted at the vaginal apex.  No lesions, bleeding, or discharge.  Cuff intact.  On bimanual exam, cuff intact, no nodularity or masses.  This is confirmed on rectovaginal exam.  Laboratory & Radiologic Studies: CT C/A/P on 09/20/21: IMPRESSION: CT CHEST IMPRESSION   1. Left upper lobe surgical changes. Increased branching soft tissue density compared to 2016. Most likely related to progressive mucoid impaction. Locally recurrent neoplasm is felt unlikely, especially given remote lung  cancer history. This could be re-evaluated with chest CT at 3 to 6 months. PET would likely be unhelpful, as either infectious mucoid impaction or local recurrence would be tracer avid. 2. No typical findings of uterine cancer metastatic disease within the chest. 3. Aortic atherosclerosis (ICD10-I70.0), coronary artery atherosclerosis and emphysema (ICD10-J43.9).   CT ABDOMEN AND PELVIS IMPRESSION   1. Interval hysterectomy, without findings of residual or metastatic disease. 2. Apparent gastric wall thickening could be due to underdistention. Correlate with symptoms of gastritis. 3. Left pelvic sidewall seroma/lymphangioma.  Assessment & Plan: [name] is a 71 y.o. woman with Stage IA serous uterine cancer who presents for surveillance. Completed adjuvant chemotherapy and vaginal brachytherapy in 07/2021. HER2 negative.  Patient is doing well.  She has completed adjuvant therapy and is NED on exam today.  Discussed chest and pelvic CT findings.  To assure stable imaging findings, we will plan to repeat CT scan in November or December.  If stable, will discontinue routine imaging and plan for imaging if the patient were to have new symptoms or findings on exam.  Per NCCN surveillance recommendations, in the setting of high risk endometrial cancer, we will plan on visits every 3 months for the first 2-3 years.  These will alternate between my office and radiation oncology.  The patient is scheduled to see Dr. Sondra Come in early December.  I have asked her to call after the new year to schedule a visit to see me in early March.  We reviewed signs and symptoms that would be concerning for cancer recurrence and I stressed the importance of calling if she develops any of these between visits.  Encouraged her to continue using vaginal dilator.  26 minutes of total time was spent for this patient encounter, including preparation, face-to-face counseling with the patient and coordination  of care, and documentation of the encounter.  Jeral Pinch, MD  Division of Gynecologic Oncology  Department of Obstetrics and Gynecology  [facility]

## 2021-11-13 NOTE — Patient Instructions (Addendum)
It was good to see you today.  I do not see or feel any evidence of cancer on your exam.  We will continue with visits every 3 months.  You see Dr. Sondra Come in December.  Please call back sometime after the new year to schedule a visit to see me in early March.  I will release your CT scan results once I get them.

## 2021-11-15 ENCOUNTER — Other Ambulatory Visit: Payer: Self-pay | Admitting: Family Medicine

## 2021-11-15 DIAGNOSIS — Z1231 Encounter for screening mammogram for malignant neoplasm of breast: Secondary | ICD-10-CM

## 2021-11-16 ENCOUNTER — Other Ambulatory Visit: Payer: Self-pay | Admitting: Otolaryngology

## 2021-11-16 ENCOUNTER — Ambulatory Visit
Admission: RE | Admit: 2021-11-16 | Discharge: 2021-11-16 | Disposition: A | Discharge status: Home / Self Care | Payer: Medicare Other | Source: Ambulatory Visit | Attending: Otolaryngology | Admitting: Otolaryngology

## 2021-11-16 DIAGNOSIS — E042 Nontoxic multinodular goiter: Secondary | ICD-10-CM

## 2021-11-29 ENCOUNTER — Ambulatory Visit (HOSPITAL_COMMUNITY)
Admission: RE | Admit: 2021-11-29 | Discharge: 2021-11-29 | Disposition: A | Discharge status: Home / Self Care | Payer: Medicare Other | Source: Ambulatory Visit | Attending: Hematology and Oncology | Admitting: Hematology and Oncology

## 2021-11-29 DIAGNOSIS — C541 Malignant neoplasm of endometrium: Secondary | ICD-10-CM | POA: Insufficient documentation

## 2021-11-29 DIAGNOSIS — Z452 Encounter for adjustment and management of vascular access device: Secondary | ICD-10-CM | POA: Diagnosis not present

## 2021-11-29 HISTORY — PX: IR REMOVAL TUN ACCESS W/ PORT W/O FL MOD SED: IMG2290

## 2021-11-29 MED ORDER — LIDOCAINE HCL 1 % IJ SOLN
INTRAMUSCULAR | Status: AC
  Administered 2021-11-29: 5 mL via INTRADERMAL
  Filled 2021-11-29: qty 20

## 2021-12-05 ENCOUNTER — Other Ambulatory Visit: Payer: Self-pay

## 2021-12-05 ENCOUNTER — Inpatient Hospital Stay: Payer: Medicare Other | Attending: Radiation Oncology | Admitting: Internal Medicine

## 2021-12-05 VITALS — BP 132/79 | HR 77 | Temp 97.9°F | Ht 63.0 in | Wt 156.7 lb

## 2021-12-05 DIAGNOSIS — Z86000 Personal history of in-situ neoplasm of breast: Secondary | ICD-10-CM | POA: Diagnosis not present

## 2021-12-05 DIAGNOSIS — Z8542 Personal history of malignant neoplasm of other parts of uterus: Secondary | ICD-10-CM | POA: Diagnosis not present

## 2021-12-05 DIAGNOSIS — K219 Gastro-esophageal reflux disease without esophagitis: Secondary | ICD-10-CM | POA: Insufficient documentation

## 2021-12-05 DIAGNOSIS — Z853 Personal history of malignant neoplasm of breast: Secondary | ICD-10-CM | POA: Diagnosis not present

## 2021-12-05 DIAGNOSIS — Z85118 Personal history of other malignant neoplasm of bronchus and lung: Secondary | ICD-10-CM | POA: Diagnosis present

## 2021-12-05 DIAGNOSIS — M62838 Other muscle spasm: Secondary | ICD-10-CM | POA: Insufficient documentation

## 2021-12-05 NOTE — Progress Notes (Signed)
[place] [phone]   [phone]  OFFICE PROGRESS NOTE  [name], MD [place] [address]  DIAGNOSIS:  1) stage IIA non-small cell lung cancer diagnosed in March of 2006 2) recurrent ductal carcinoma in situ of the right breast initially diagnosed in January of 2014 with recurrence in October of 2014. 3) endometrial carcinoma currently managed by Dr. Alvy Bimler PRIOR THERAPY: 1) status post left trisegmentectomy with node dissection on 06/24/2004 2) status post 4 cycles of adjuvant chemotherapy with carboplatin and paclitaxel last dose was given 10/24/2004. 3) status post right partial mastectomy on 04/10/2012 under the care of Dr. Brantley Stage. 4) status post adjuvant radiotherapy to the right breast under the care of Dr. Valere Dross 5) Tamoxifen 20 mg by mouth daily started in May of 2014. 6) status post laparoscopic total hysterectomy with bilateral salpingo oophorectomy and sentinel lymph node biopsy as well as omental biopsy and cystoscopy under the care of [name]. 7) status post systemic chemotherapy with carboplatin and paclitaxel under the care of Dr. Simeon Craft such for 6 cycles last dose was giving 08/22/2021.  CURRENT THERAPY: Observation.  INTERVAL HISTORY: [name] 71 y.o. female returns to the clinic today for follow-up visit.  The patient is recovering from her most recent systemic chemotherapy fairly well.  She continues to have some muscle spasm especially in the neck area.  She denied having any current chest pain, shortness of breath, cough or hemoptysis.  She has no nausea, vomiting, diarrhea or constipation.  She has no headache or visual changes.  She denied having any recent weight loss or night sweats.  She is here today for reevaluation.    MEDICAL HISTORY: Past Medical History:  Diagnosis Date   Anxiety    Breast cancer ([place])    2014 and 2015   Cancer ([place])    lung cancer    DDD  (degenerative disc disease)    neck   Dry eye    Eczema    Family history of breast cancer    Family history of stomach cancer    GERD (gastroesophageal reflux disease)    occasional - no current med.   Goiter    History of breast cancer    History of lung cancer    non-small cell - left upper lobe   History of radiation therapy    Vagina- 045/04/23-05/23/23- Dr. Gery Pray   IBS (irritable bowel syndrome)    Non-allergic rhinitis    sinusitis   Osteoarthritis    Personal history of radiation therapy 2014   PONV (postoperative nausea and vomiting)    Radiation 07/03/12-07/09/12   Right upper lung 5400 cGy   Thyroid nodule, toxic or with hyperthyroidism    no current med.   Urinary, incontinence, stress female    Vasomotor rhinitis    Vertigo     ALLERGIES:  is allergic to augmentin [amoxicillin-pot clavulanate], morphine and related, amoxicillin, asa [aspirin], banana, ceftin [cefuroxime axetil], citrus, lactose intolerance (gi), orange oil, penicillins, cefuroxime, clavulanic acid, tetanus-diphth-acell pertussis, and tape.  MEDICATIONS:  Current Outpatient Medications  Medication Sig Dispense Refill   acetaminophen (TYLENOL) 500 MG tablet Take 1,000 mg by mouth at bedtime as needed (Arthritis pain).     cycloSPORINE (RESTASIS) 0.05 % ophthalmic emulsion Place 1 drop into both eyes 2 (two) times daily.      esomeprazole (NEXIUM) 20 MG capsule Take 20 mg by mouth daily.  fluocinonide gel (LIDEX) 6.46 % Apply 1 application topically daily as needed (canker sores).      fluticasone (FLONASE) 50 MCG/ACT nasal spray Place 2 sprays into both nostrils daily.      Lactobacillus (ACIDOPHILUS PO) Take 5 mg by mouth daily.     Multiple Vitamin (MULTIVITAMIN) capsule Take 1 capsule by mouth in the morning.      Olopatadine HCl 0.2 % SOLN Place 1 drop into both eyes daily as needed (Allergies/Seasonal).     Polyethyl Glycol-Propyl Glycol (SYSTANE) 0.4-0.3 % SOLN Place 1 drop into both  eyes daily as needed (Dry eye).     vitamin E 400 UNIT capsule Take 400 Units by mouth daily.      No current facility-administered medications for this visit.    SURGICAL HISTORY:  Past Surgical History:  Procedure Laterality Date   ANTERIOR CERVICAL DECOMP/DISCECTOMY FUSION N/A 12/22/2019   Procedure: Cervical four-five Cervical five-six Cervical six-seven Anterior cervical decompression/discectomy/fusion;  Surgeon: Erline Levine, MD;  Location: [place];  Service: Neurosurgery;  Laterality: N/A;   APPENDECTOMY  1971   BREAST LUMPECTOMY Right 2014   BREAST RECONSTRUCTION WITH PLACEMENT OF TISSUE EXPANDER AND FLEX HD (ACELLULAR HYDRATED DERMIS) Right 05/20/2013   Procedure: IMMEDIATE RIGHT BREAST RECONSTRUCTION WITH LATISSAMUS MUSCLE FLAP AND PLACEMENT OF TISSUE EXPANDER TO RIGHT BREAST;  Surgeon: Theodoro Kos, DO;  Location: [facility];  Service: Plastics;  Laterality: Right;   BREAST REDUCTION WITH MASTOPEXY Left 10/22/2013   Procedure: BREAST REDUCTION/MASTOPEXY TO LEFT BREAST WITH LIPOSUCTION/FAT GRAFTING FOR SYMMETRY;  Surgeon: Theodoro Kos, DO;  Location: [facility];  Service: Plastics;  Laterality: Left;   CYSTOSCOPY N/A 04/11/2021   Procedure: CYSTOSCOPY;  Surgeon: [name], MD;  Location: [facility];  Service: Gynecology;  Laterality: N/A;   DE QUERVAIN'S RELEASE Left 07/31/2002   FRACTURE SURGERY Left 2019   Torn meniscus; bone fragments removed   ganglion cyst R hand     1980s   IR IMAGING GUIDED PORT INSERTION  05/04/2021   IR REMOVAL TUN ACCESS W/ PORT W/O FL MOD SED  11/29/2021   IR US GUIDE VASC ACCESS RIGHT  05/04/2021   KNEE SURGERY Left    torn meniscus   LUNG REMOVAL, PARTIAL     2014   MASTECTOMY Right    2015   MASTECTOMY W/ SENTINEL NODE BIOPSY Right 05/20/2013   Procedure: RIGHT SIMPLEMASTECTOMY AND SENTINEL NODE   Sampling;  Surgeon: Marcello Moores A. Cornett, MD;  Location: [place] [facility] [place];  Service: General;  Laterality: Right;   MOUTH SURGERY Right may 2014    "keratocystic odotogenic tumor" - X 2   NASAL SINUS SURGERY  ?1988-1990   "at least 3" (05/21/2013)   PARTIAL MASTECTOMY WITH NEEDLE LOCALIZATION  04/10/2012   Procedure: PARTIAL MASTECTOMY WITH NEEDLE LOCALIZATION;  Surgeon: Marcello Moores A. Cornett, MD;  Location: [place];  Service: General;  Laterality: Right;   REDUCTION MAMMAPLASTY Left 2015   REMOVAL OF TISSUE EXPANDER AND PLACEMENT OF IMPLANT Right 10/22/2013   Procedure: REMOVAL OF RIGHT TISSUE EXPANDER WITH PLACEMENT OF RIGHT BREAST IMPLANT;  Surgeon: Theodoro Kos, DO;  Location: [facility];  Service: Plastics;  Laterality: Right;   ROBOTIC ASSISTED BILATERAL SALPINGO OOPHERECTOMY Bilateral 04/11/2021   Procedure: XI ROBOTIC ASSISTED HYSTERECTOMY WITH BILATERAL SALPINGO OOPHORECTOMY; OMENTAL BIOPSY;  Surgeon: [name], MD;  Location: [facility];  Service: Gynecology;  Laterality: Bilateral;   SENTINEL NODE BIOPSY N/A 04/11/2021   Procedure: SENTINEL NODE BIOPSY;  Surgeon: Berline Lopes,  Corinna Lines, MD;  Location: [facility];  Service: Gynecology;  Laterality: N/A;   SEPTOPLASTY     & sinus surgeries 1980s-1990s   THORACOTOMY Left 07/14/2004   TRIGGER FINGER RELEASE Bilateral    "I've had OR on q finger" (05/21/2013)   TRIGGER FINGER RELEASE Right 07/14/1999   index and little fingers   TRIGGER FINGER RELEASE Right 07/17/2002   long and ring fingers   TRIGGER FINGER RELEASE Left 07/31/2002   little, ring and index fingers   VIDEO ASSISTED THORACOSCOPY (VATS)/ LOBECTOMY Left 07/14/2004   upper tri-segmentectomy   WISDOM TOOTH EXTRACTION     "3"    REVIEW OF SYSTEMS:  A comprehensive review of systems was negative except for: Constitutional: positive for fatigue Musculoskeletal: positive for arthralgias and neck pain   PHYSICAL EXAMINATION: General appearance: alert, cooperative, fatigued, and no distress Head: Normocephalic, without obvious abnormality, atraumatic Neck: no adenopathy Lymph nodes: Cervical,  supraclavicular, and axillary nodes normal. Resp: clear to auscultation bilaterally Back: symmetric, no curvature. ROM normal. No CVA tenderness. Cardio: regular rate and rhythm, S1, S2 normal, no murmur, click, rub or gallop GI: soft, non-tender; bowel sounds normal; no masses,  no organomegaly Extremities: extremities normal, atraumatic, no cyanosis or edema  ECOG PERFORMANCE STATUS: 0 - Asymptomatic  Blood pressure 132/79, pulse 77, temperature 97.9 F (36.6 C), temperature source Oral, height 5\' 3"  (1.6 m), weight 156 lb 11.2 oz (71.1 kg), SpO2 100 %.  LABORATORY DATA: Lab Results  Component Value Date   WBC 3.1 (L) 09/20/2021   HGB 10.1 (L) 09/20/2021   HCT 30.5 (L) 09/20/2021   MCV 101.0 (H) 09/20/2021   PLT 199 09/20/2021      Chemistry      Component Value Date/Time   NA 141 09/20/2021 0754   NA 145 (H) 05/06/2019 1021   NA 141 10/29/2016 0742   K 3.9 09/20/2021 0754   K 4.1 10/29/2016 0742   CL 106 09/20/2021 0754   CL 108 (H) 04/28/2012 1008   CO2 27 09/20/2021 0754   CO2 26 10/29/2016 0742   BUN 14 09/20/2021 0754   BUN 15 05/06/2019 1021   BUN 21.1 10/29/2016 0742   CREATININE 0.72 09/20/2021 0754   CREATININE 0.9 10/29/2016 0742      Component Value Date/Time   CALCIUM 9.1 09/20/2021 0754   CALCIUM 9.0 10/29/2016 0742   ALKPHOS 87 09/20/2021 0754   ALKPHOS 49 10/29/2016 0742   AST 22 09/20/2021 0754   AST 15 10/29/2016 0742   ALT 15 09/20/2021 0754   ALT 12 10/29/2016 0742   BILITOT 0.3 09/20/2021 0754   BILITOT 0.32 10/29/2016 0742       RADIOGRAPHIC STUDIES: IR REMOVAL TUN ACCESS W/ PORT W/O FL MOD SED  Result Date: 11/29/2021 CLINICAL DATA:  History of endometrial carcinoma and status post Port-A-Cath placement on 05/04/2021 for chemotherapy. The patient has completed therapy and returns for port removal. EXAM: REMOVAL OF IMPLANTED TUNNELED PORT-A-CATH MEDICATIONS: None PROCEDURE: The right chest Port-A-Cath site was prepped with chlorhexidine.  A sterile gown and gloves were worn during the procedure. Local anesthesia was provided with 1% lidocaine. An incision was made overlying the Port-A-Cath with a #15 scalpel. Utilizing sharp and blunt dissection, the Port-A-Cath was removed. Portable cautery was utilized. Retention sutures were removed. The pocked was irrigated with sterile saline. Wound closure was performed with subcutaneous 3-0 Monocryl, subcuticular 4-0 Vicryl and Dermabond. The entire Port-A-Cath was removed successfully. IMPRESSION: Removal of implanted Port-A-Cath utilizing sharp and  blunt dissection. The procedure was uncomplicated. Electronically Signed   By: Aletta Edouard M.D.   On: 11/29/2021 09:56   US THYROID  Result Date: 11/18/2021 CLINICAL DATA:  Goiter. History of multinodular thyroid gland. Patient previously underwent biopsy of right inferior thyroid nodule in November of 2019 EXAM: THYROID ULTRASOUND TECHNIQUE: Ultrasound examination of the thyroid gland and adjacent soft tissues was performed. COMPARISON:  Prior thyroid ultrasound 02/04/2018; 12/29/2018 FINDINGS: Parenchymal Echotexture: Mildly heterogenous Isthmus: 0.3 cm Right lobe: 5.3 x 2.4 x 2.4 cm Left lobe: 3.7 x 1.2 x 1.0 cm _________________________________________________________ Estimated total number of nodules >/= 1 cm: 4 Number of spongiform nodules >/=  2 cm not described below (TR1): 0 Number of mixed cystic and solid nodules >/= 1.5 cm not described below (TR2): 0 _________________________________________________________ Nodule # 1: Increasing cystic degeneration of right superior thyroid nodule which is now spongiform in appearance and considered sonographically benign. No further follow-up required. Nodule # 2: Previously biopsied nodule in the right mid gland also demonstrates increasing cystic degeneration and is now spongiform in appearance. Additional smaller nodules scattered throughout the right and left gland are noted incidentally. None meet  imaging criteria to warrant further evaluation. IMPRESSION: 1. Progressive cystic degeneration of nodule under surveillance in the right superior gland. The nodule is now spongiform in appearance which is considered sonographically low risk. No further follow-up required. 2. Previously biopsied nodule in the right mid gland also demonstrates increasing internal cystic degeneration consistent with a benign spongiform nodule. 3. No new nodules or suspicious features. The above is in keeping with the ACR TI-RADS recommendations - J Am Coll Radiol 2017;14:587-595. Electronically Signed   By: Jacqulynn Cadet M.D.   On: 11/18/2021 08:25    ASSESSMENT AND PLAN:  This is a very pleasant 71 years old white female with stage IIA non-small cell lung cancer as well as recurrent breast ductal carcinoma in situ. The patient is status post right lumpectomy followed by adjuvant radiotherapy.  She was also treated with adjuvant tamoxifen for more than 9 years. The patient was diagnosed in January 2023 with endometrial carcinoma status post total abdominal hysterectomy with bilateral salpingo-oophorectomy under the care of [name] followed by systemic chemotherapy with carboplatin and paclitaxel under the care of Dr. Alvy Bimler. She is feeling much better today with no concerning complaints except for mild fatigue. She has CT scan of the chest, abdomen and pelvis scheduled on February 26, 2022. I will arrange for the patient to have a follow-up appointment with me after the scan for discussion of its results and any further recommendation regarding her condition. For now she will continue on observation. She was advised to call immediately if she has any other concerning symptoms in the interval.  All questions were answered. The patient knows to call the clinic with any problems, questions or concerns. We can certainly see the patient much sooner if necessary.   Disclaimer: This note was dictated with voice  recognition software. Similar sounding words can inadvertently be transcribed and may be missed upon review.

## 2022-01-01 ENCOUNTER — Ambulatory Visit
Admission: RE | Admit: 2022-01-01 | Discharge: 2022-01-01 | Disposition: A | Discharge status: Home / Self Care | Payer: Medicare Other | Source: Ambulatory Visit | Attending: Family Medicine | Admitting: Family Medicine

## 2022-01-01 DIAGNOSIS — Z1231 Encounter for screening mammogram for malignant neoplasm of breast: Secondary | ICD-10-CM

## 2022-01-30 ENCOUNTER — Other Ambulatory Visit: Payer: Self-pay | Admitting: Family Medicine

## 2022-01-30 DIAGNOSIS — M85852 Other specified disorders of bone density and structure, left thigh: Secondary | ICD-10-CM

## 2022-02-24 NOTE — Progress Notes (Signed)
Radiation Oncology         [phone] ________________________________  Name: [name] MRN: [id number]  Date: 02/26/2022  DOB: 31-Dec-1950  Follow-Up Visit Note  CC: [name], MD  [name], MD  No diagnosis found.  Diagnosis:  The encounter diagnosis was Endometrial carcinoma ([place]).   FIGO Stage IA (pT1a, pN0, cM0) Endometrial Cancer, high-grade serous  Interval Since Last Radiation: 6 months and 9 days   Intent: Curative  Radiation Treatment Dates: 07/27/2021 through 08/17/2021 Site Technique Total Dose (Gy) Dose per Fx (Gy) Completed Fx Beam Energies  Vagina: Pelvis HDR-brachy 30/30 6 5/5 Ir-192   Narrative:  The patient returns today for routine 6 month follow-up, she was last seen here for follow-up on 09/18/21.   CT of the chest abdomen and pelvis performed on 09/20/21 showed no findings suggestive of residual or metastatic disease in the abdomen or pelvis. CT however showed a left pelvic sidewall seroma/lymphangioma, and an increase in a branching LUL soft tissue density compared to 2016 likely related to progressive mucoid impaction (locally recurrent neoplasm is unlikely). CT otherwise showed no typical findings of uterine cancer metastatic disease within the chest.       During her most recent follow-up visit with [name] on 11/13/21, the patient reported some residual neuropathy in her hands and feet from chemotherapy, some worsening arthritis since chemotherapy (especially in her knees and back), and some left LE edema which she manages with ice and compression garments with relief. She otherwise denied any symptoms concerning for disease recurrence and was noted as NED on examination. [name] also reviewed recent CT findings with the patient and recommended repeat imaging in several months (repeat CT was performed today, results are pending that this time).   The patient most recently followed up with medical oncology, Dr. Julien Nordmann, on 12/05/21.  During which time, the patient reported feeling well other than some ongoing muscle spasms from systemic treatment (especially in the neck area), and mild fatigue.   The patient will follow-up with Dr. Julien Nordmann and [name] in the near future to discuss her repeat CT results from today.   Other imaging performed in the interval includes:  -- Thyroid US on 11/16/21 showed progressive cystic degeneration of the nodule under surveillance in the right superior gland which now appears to be low risk. [place] also showed an increase in internal cystic degeneration of the previously biopsied nodule in the right mid gland (both nodules are now spongiform in appearance and thus almost certainly benign). [place] otherwise showed no new nodules or suspicious features.    -- Bilateral screening mammogram performed on 01/01/22 showed no evidence of malignancy in either breast.   ***  Allergies:  is allergic to augmentin [amoxicillin-pot clavulanate], morphine and related, amoxicillin, asa [aspirin], banana, ceftin [cefuroxime axetil], citrus, lactose intolerance (gi), orange oil, penicillins, cefuroxime, clavulanic acid, tetanus-diphth-acell pertussis, and tape.  Meds: Current Outpatient Medications  Medication Sig Dispense Refill   acetaminophen (TYLENOL) 500 MG tablet Take 1,000 mg by mouth at bedtime as needed (Arthritis pain).     cycloSPORINE (RESTASIS) 0.05 % ophthalmic emulsion Place 1 drop into both eyes 2 (two) times daily.      esomeprazole (NEXIUM) 20 MG capsule Take 20 mg by mouth daily.     fluocinonide gel (LIDEX) 9.02 % Apply 1 application topically daily as needed (canker sores).      fluticasone (FLONASE) 50 MCG/ACT nasal spray Place 2 sprays into both nostrils daily.  Lactobacillus (ACIDOPHILUS PO) Take 5 mg by mouth daily.     Multiple Vitamin (MULTIVITAMIN) capsule Take 1 capsule by mouth in the morning.      Olopatadine HCl 0.2 % SOLN Place 1 drop into both eyes daily as needed  (Allergies/Seasonal).     Polyethyl Glycol-Propyl Glycol (SYSTANE) 0.4-0.3 % SOLN Place 1 drop into both eyes daily as needed (Dry eye).     vitamin E 400 UNIT capsule Take 400 Units by mouth daily.      No current facility-administered medications for this encounter.    Physical Findings: The patient is in no acute distress. Patient is alert and oriented.  vitals were not taken for this visit. .  No significant changes. Lungs are clear to auscultation bilaterally. Heart has regular rate and rhythm. No palpable cervical, supraclavicular, or axillary adenopathy. Abdomen soft, non-tender, normal bowel sounds.  On pelvic examination the external genitalia were unremarkable. A speculum exam was performed. There are no mucosal lesions noted in the vaginal vault. A Pap smear was obtained of the proximal vagina. On bimanual and rectovaginal examination there were no pelvic masses appreciated. ***    Lab Findings: Lab Results  Component Value Date   WBC 3.1 (L) 09/20/2021   HGB 10.1 (L) 09/20/2021   HCT 30.5 (L) 09/20/2021   MCV 101.0 (H) 09/20/2021   PLT 199 09/20/2021    Radiographic Findings: No results found.  Impression:  The encounter diagnosis was Endometrial carcinoma ([place]).   FIGO Stage IA (pT1a, pN0, cM0) Endometrial Cancer, high-grade serous  The patient is recovering from the effects of radiation.  ***  Plan:  ***   *** minutes of total time was spent for this patient encounter, including preparation, face-to-face counseling with the patient and coordination of care, physical exam, and documentation of the encounter. ____________________________________  [name], PhD, MD  This document serves as a record of services personally performed by Gery Pray, MD. It was created on his behalf by Roney Mans, a trained medical scribe. The creation of this record is based on the scribe's personal observations and the provider's statements to them. This document has been  checked and approved by the attending provider.

## 2022-02-26 ENCOUNTER — Ambulatory Visit
Admission: RE | Admit: 2022-02-26 | Discharge: 2022-02-26 | Disposition: A | Discharge status: Home / Self Care | Payer: Medicare Other | Source: Ambulatory Visit | Attending: Radiation Oncology | Admitting: Radiation Oncology

## 2022-02-26 ENCOUNTER — Encounter (HOSPITAL_COMMUNITY): Payer: Self-pay

## 2022-02-26 ENCOUNTER — Ambulatory Visit (HOSPITAL_COMMUNITY)
Admission: RE | Admit: 2022-02-26 | Discharge: 2022-02-26 | Disposition: A | Discharge status: Home / Self Care | Payer: Medicare Other | Source: Ambulatory Visit | Attending: Gynecologic Oncology | Admitting: Gynecologic Oncology

## 2022-02-26 ENCOUNTER — Encounter: Payer: Self-pay | Admitting: Radiation Oncology

## 2022-02-26 ENCOUNTER — Other Ambulatory Visit: Payer: Self-pay

## 2022-02-26 DIAGNOSIS — Z923 Personal history of irradiation: Secondary | ICD-10-CM | POA: Diagnosis not present

## 2022-02-26 DIAGNOSIS — C541 Malignant neoplasm of endometrium: Secondary | ICD-10-CM

## 2022-02-26 DIAGNOSIS — Z79899 Other long term (current) drug therapy: Secondary | ICD-10-CM | POA: Diagnosis not present

## 2022-02-26 DIAGNOSIS — Z8542 Personal history of malignant neoplasm of other parts of uterus: Secondary | ICD-10-CM | POA: Insufficient documentation

## 2022-02-26 MED ORDER — IOHEXOL 300 MG/ML  SOLN
100.0000 mL | Freq: Once | INTRAMUSCULAR | Status: AC | PRN
  Administered 2022-02-26: 100 mL via INTRAVENOUS

## 2022-02-26 MED ORDER — SODIUM CHLORIDE (PF) 0.9 % IJ SOLN
INTRAMUSCULAR | Status: AC
  Filled 2022-02-26: qty 50

## 2022-02-26 NOTE — Progress Notes (Signed)
[name] is here today for follow up post radiation to the pelvic.  They completed their radiation on: 08/17/21   Does the patient complain of any of the following:  Pain: No  Abdominal bloating: Yes, due to IBS. Diarrhea/Constipation: Yes due to IBS.  Nausea/Vomiting: No Vaginal Discharge: No Blood in Urine or Stool: No Urinary Issues (dysuria/incomplete emptying/ incontinence/ increased frequency/urgency):  No Does patient report using vaginal dilator 2-3 times a week and/or sexually active 2-3 weeks: Yes Post radiation skin changes: No   Additional comments if applicable:    BP (!) 189/84 (BP Location: Left Arm, Patient Position: Sitting)   Pulse 95   Temp (!) 97.1 F (36.2 C) (Temporal)   Resp 18   Ht 5\' 3"  (1.6 m)   Wt 159 lb 8 oz (72.3 kg)   SpO2 100%   BMI 28.25 kg/m

## 2022-02-27 ENCOUNTER — Telehealth: Payer: Self-pay | Admitting: Gynecologic Oncology

## 2022-02-27 DIAGNOSIS — Z85118 Personal history of other malignant neoplasm of bronchus and lung: Secondary | ICD-10-CM

## 2022-02-27 NOTE — Telephone Encounter (Signed)
Called patient to discuss CT results.  No evidence of recurrent or metastatic disease related to her uterine cancer.  Her stomach wall continues to look thick although difficult study due to under distention of the stomach.  Discussed pulmonary findings, thought to likely be benign with recommendation for follow-up chest imaging in 3-6 months.  Plan to order CT chest for 3 months and have the patient scheduled to see me around that same time.  I have encouraged her to reach back out to gastroenterology.  Prior to her recent uterine cancer diagnosis, she was in the process of getting colonoscopy and endoscopy scheduled.  We also discussed findings of increased stool burden on her exam.  I encouraged her to consider starting regular stool softener or as needed MiraLAX.  Jeral Pinch MD Gynecologic Oncology

## 2022-03-02 ENCOUNTER — Telehealth: Payer: Self-pay | Admitting: *Deleted

## 2022-03-02 NOTE — Telephone Encounter (Signed)
Per Dr Berline Lopes scheduled the patient for a CT scan and follow up in March. Patient aware of appt dates/times/instructions

## 2022-03-06 ENCOUNTER — Inpatient Hospital Stay (HOSPITAL_BASED_OUTPATIENT_CLINIC_OR_DEPARTMENT_OTHER): Payer: Medicare Other | Admitting: Internal Medicine

## 2022-03-06 ENCOUNTER — Inpatient Hospital Stay: Payer: Medicare Other | Attending: Radiation Oncology

## 2022-03-06 VITALS — BP 140/84 | HR 75 | Temp 98.1°F | Resp 16 | Wt 156.1 lb

## 2022-03-06 DIAGNOSIS — M1990 Unspecified osteoarthritis, unspecified site: Secondary | ICD-10-CM | POA: Diagnosis not present

## 2022-03-06 DIAGNOSIS — Z85118 Personal history of other malignant neoplasm of bronchus and lung: Secondary | ICD-10-CM | POA: Insufficient documentation

## 2022-03-06 DIAGNOSIS — K589 Irritable bowel syndrome without diarrhea: Secondary | ICD-10-CM | POA: Insufficient documentation

## 2022-03-06 DIAGNOSIS — J432 Centrilobular emphysema: Secondary | ICD-10-CM | POA: Diagnosis not present

## 2022-03-06 DIAGNOSIS — I700 Atherosclerosis of aorta: Secondary | ICD-10-CM | POA: Diagnosis not present

## 2022-03-06 DIAGNOSIS — Z923 Personal history of irradiation: Secondary | ICD-10-CM | POA: Diagnosis not present

## 2022-03-06 DIAGNOSIS — K219 Gastro-esophageal reflux disease without esophagitis: Secondary | ICD-10-CM | POA: Diagnosis not present

## 2022-03-06 DIAGNOSIS — Z79899 Other long term (current) drug therapy: Secondary | ICD-10-CM | POA: Insufficient documentation

## 2022-03-06 DIAGNOSIS — R222 Localized swelling, mass and lump, trunk: Secondary | ICD-10-CM | POA: Diagnosis not present

## 2022-03-06 DIAGNOSIS — Z800 Family history of malignant neoplasm of digestive organs: Secondary | ICD-10-CM | POA: Diagnosis not present

## 2022-03-06 DIAGNOSIS — K5730 Diverticulosis of large intestine without perforation or abscess without bleeding: Secondary | ICD-10-CM | POA: Diagnosis not present

## 2022-03-06 DIAGNOSIS — C541 Malignant neoplasm of endometrium: Secondary | ICD-10-CM | POA: Insufficient documentation

## 2022-03-06 DIAGNOSIS — Z803 Family history of malignant neoplasm of breast: Secondary | ICD-10-CM | POA: Diagnosis not present

## 2022-03-06 DIAGNOSIS — Z86000 Personal history of in-situ neoplasm of breast: Secondary | ICD-10-CM

## 2022-03-06 DIAGNOSIS — Z79810 Long term (current) use of selective estrogen receptor modulators (SERMs): Secondary | ICD-10-CM | POA: Diagnosis not present

## 2022-03-06 LAB — CBC WITH DIFFERENTIAL (CANCER CENTER ONLY)
Abs Immature Granulocytes: 0.01 10*3/uL (ref 0.00–0.07)
Basophils Absolute: 0 10*3/uL (ref 0.0–0.1)
Basophils Relative: 1 %
Eosinophils Absolute: 0.3 10*3/uL (ref 0.0–0.5)
Eosinophils Relative: 8 %
HCT: 38.6 % (ref 36.0–46.0)
Hemoglobin: 13.1 g/dL (ref 12.0–15.0)
Immature Granulocytes: 0 %
Lymphocytes Relative: 26 %
Lymphs Abs: 1.1 10*3/uL (ref 0.7–4.0)
MCH: 31.9 pg (ref 26.0–34.0)
MCHC: 33.9 g/dL (ref 30.0–36.0)
MCV: 93.9 fL (ref 80.0–100.0)
Monocytes Absolute: 0.5 10*3/uL (ref 0.1–1.0)
Monocytes Relative: 10 %
Neutro Abs: 2.4 10*3/uL (ref 1.7–7.7)
Neutrophils Relative %: 55 %
Platelet Count: 272 10*3/uL (ref 150–400)
RBC: 4.11 MIL/uL (ref 3.87–5.11)
RDW: 13.9 % (ref 11.5–15.5)
WBC Count: 4.3 10*3/uL (ref 4.0–10.5)
nRBC: 0 % (ref 0.0–0.2)

## 2022-03-06 LAB — CMP (CANCER CENTER ONLY)
ALT: 13 U/L (ref 0–44)
AST: 16 U/L (ref 15–41)
Albumin: 3.9 g/dL (ref 3.5–5.0)
Alkaline Phosphatase: 84 U/L (ref 38–126)
Anion gap: 5 (ref 5–15)
BUN: 13 mg/dL (ref 8–23)
CO2: 31 mmol/L (ref 22–32)
Calcium: 9.9 mg/dL (ref 8.9–10.3)
Chloride: 104 mmol/L (ref 98–111)
Creatinine: 0.73 mg/dL (ref 0.44–1.00)
GFR, Estimated: >60 mL/min (ref >60)
Glucose, Bld: 100 mg/dL — ABNORMAL HIGH (ref 70–99)
Potassium: 4.3 mmol/L (ref 3.5–5.1)
Sodium: 140 mmol/L (ref 135–145)
Total Bilirubin: 0.4 mg/dL (ref 0.3–1.2)
Total Protein: 6.6 g/dL (ref 6.5–8.1)

## 2022-03-06 NOTE — Progress Notes (Signed)
[name] [phone]   [phone]  OFFICE PROGRESS NOTE  [name], MD [place] [address]  DIAGNOSIS:  1) stage IIA non-small cell lung cancer diagnosed in March of 2006 2) recurrent ductal carcinoma in situ of the right breast initially diagnosed in January of 2014 with recurrence in October of 2014. 3) endometrial carcinoma currently managed by Dr. Alvy Bimler PRIOR THERAPY: 1) status post left trisegmentectomy with node dissection on 06/24/2004 2) status post 4 cycles of adjuvant chemotherapy with carboplatin and paclitaxel last dose was given 10/24/2004. 3) status post right partial mastectomy on 04/10/2012 under the care of Dr. Brantley Stage. 4) status post adjuvant radiotherapy to the right breast under the care of Dr. Valere Dross 5) Tamoxifen 20 mg by mouth daily started in May of 2014. 6) status post laparoscopic total hysterectomy with bilateral salpingo oophorectomy and sentinel lymph node biopsy as well as omental biopsy and cystoscopy under the care of [name]. 7) status post systemic chemotherapy with carboplatin and paclitaxel under the care of Dr. Alvy Bimler for 6 cycles last dose was giving 08/22/2021.  CURRENT THERAPY: Observation.  INTERVAL HISTORY: [name] 71 y.o. female returns to the clinic today for follow-up visit.  The patient is feeling fine today with no concerning complaints.  She denied having any current chest pain, shortness of breath, cough or hemoptysis.  She has no nausea, vomiting, diarrhea or constipation.  She has no headache or visual changes.  She denied having any recent weight loss or night sweats.  She completed her course of systemic chemotherapy under the care of Dr. Alvy Bimler for endometrial carcinoma.  She is followed by [name].  She had repeat CT scan of the chest, abdomen and pelvis performed recently and she is here for evaluation and discussion of her imaging studies  as well as lab work.   MEDICAL HISTORY: Past Medical History:  Diagnosis Date   Anxiety    Breast cancer ([place])    2014 and 2015   Cancer ([place])    lung cancer    DDD (degenerative disc disease)    neck   Dry eye    Eczema    Family history of breast cancer    Family history of stomach cancer    GERD (gastroesophageal reflux disease)    occasional - no current med.   Goiter    History of breast cancer    History of lung cancer    non-small cell - left upper lobe   History of radiation therapy    Vagina- 045/04/23-05/23/23- Dr. Gery Pray   IBS (irritable bowel syndrome)    Non-allergic rhinitis    sinusitis   Osteoarthritis    Personal history of radiation therapy 2014   PONV (postoperative nausea and vomiting)    Radiation 07/03/12-07/09/12   Right upper lung 5400 cGy   Thyroid nodule, toxic or with hyperthyroidism    no current med.   Urinary, incontinence, stress female    Vasomotor rhinitis    Vertigo     ALLERGIES:  is allergic to augmentin [amoxicillin-pot clavulanate], morphine and related, amoxicillin, asa [aspirin], banana, ceftin [cefuroxime axetil], citrus, lactose intolerance (gi), orange oil, penicillins, cefuroxime, clavulanic acid, tetanus-diphth-acell pertussis, and tape.  MEDICATIONS:  Current Outpatient Medications  Medication Sig Dispense Refill   acetaminophen (TYLENOL) 500 MG tablet Take 1,000 mg by mouth at bedtime as needed (Arthritis pain).     cycloSPORINE (RESTASIS) 0.05 %  ophthalmic emulsion Place 1 drop into both eyes 2 (two) times daily.      esomeprazole (NEXIUM) 20 MG capsule Take 20 mg by mouth daily.     fluocinonide gel (LIDEX) 3.76 % Apply 1 application topically daily as needed (canker sores).      fluticasone (FLONASE) 50 MCG/ACT nasal spray Place 2 sprays into both nostrils daily.      Lactobacillus (ACIDOPHILUS PO) Take 5 mg by mouth daily.     Multiple Vitamin (MULTIVITAMIN) capsule Take 1 capsule by mouth in the morning.       Olopatadine HCl 0.2 % SOLN Place 1 drop into both eyes daily as needed (Allergies/Seasonal).     Polyethyl Glycol-Propyl Glycol (SYSTANE) 0.4-0.3 % SOLN Place 1 drop into both eyes daily as needed (Dry eye).     vitamin E 400 UNIT capsule Take 400 Units by mouth daily.      No current facility-administered medications for this visit.    SURGICAL HISTORY:  Past Surgical History:  Procedure Laterality Date   ANTERIOR CERVICAL DECOMP/DISCECTOMY FUSION N/A 12/22/2019   Procedure: Cervical four-five Cervical five-six Cervical six-seven Anterior cervical decompression/discectomy/fusion;  Surgeon: Erline Levine, MD;  Location: [facility];  Service: Neurosurgery;  Laterality: N/A;   APPENDECTOMY  1971   BREAST LUMPECTOMY Right 2014   BREAST RECONSTRUCTION WITH PLACEMENT OF TISSUE EXPANDER AND FLEX HD (ACELLULAR HYDRATED DERMIS) Right 05/20/2013   Procedure: IMMEDIATE RIGHT BREAST RECONSTRUCTION WITH LATISSAMUS MUSCLE FLAP AND PLACEMENT OF TISSUE EXPANDER TO RIGHT BREAST;  Surgeon: Theodoro Kos, DO;  Location: [facility];  Service: Plastics;  Laterality: Right;   BREAST REDUCTION WITH MASTOPEXY Left 10/22/2013   Procedure: BREAST REDUCTION/MASTOPEXY TO LEFT BREAST WITH LIPOSUCTION/FAT GRAFTING FOR SYMMETRY;  Surgeon: Theodoro Kos, DO;  Location: [facility];  Service: Plastics;  Laterality: Left;   CYSTOSCOPY N/A 04/11/2021   Procedure: CYSTOSCOPY;  Surgeon: [name], MD;  Location: [facility];  Service: Gynecology;  Laterality: N/A;   DE QUERVAIN'S RELEASE Left 07/31/2002   FRACTURE SURGERY Left 2019   Torn meniscus; bone fragments removed   ganglion cyst R hand     1980s   IR IMAGING GUIDED PORT INSERTION  05/04/2021   IR REMOVAL TUN ACCESS W/ PORT W/O FL MOD SED  11/29/2021   IR US GUIDE VASC ACCESS RIGHT  05/04/2021   KNEE SURGERY Left    torn meniscus   LUNG REMOVAL, PARTIAL     2014   MASTECTOMY Right    2015   MASTECTOMY W/ SENTINEL NODE BIOPSY Right 05/20/2013   Procedure: RIGHT  SIMPLEMASTECTOMY AND SENTINEL NODE   Sampling;  Surgeon: Marcello Moores A. Cornett, MD;  Location: [facility];  Service: General;  Laterality: Right;   MOUTH SURGERY Right may 2014   "keratocystic odotogenic tumor" - X 2   NASAL SINUS SURGERY  ?1988-1990   "at least 3" (05/21/2013)   PARTIAL MASTECTOMY WITH NEEDLE LOCALIZATION  04/10/2012   Procedure: PARTIAL MASTECTOMY WITH NEEDLE LOCALIZATION;  Surgeon: Marcello Moores A. Cornett, MD;  Location: [facility];  Service: General;  Laterality: Right;   REDUCTION MAMMAPLASTY Left 2015   REMOVAL OF TISSUE EXPANDER AND PLACEMENT OF IMPLANT Right 10/22/2013   Procedure: REMOVAL OF RIGHT TISSUE EXPANDER WITH PLACEMENT OF RIGHT BREAST IMPLANT;  Surgeon: Theodoro Kos, DO;  Location: [facility];  Service: Plastics;  Laterality: Right;   ROBOTIC ASSISTED BILATERAL SALPINGO OOPHERECTOMY Bilateral 04/11/2021   Procedure: XI ROBOTIC ASSISTED HYSTERECTOMY WITH BILATERAL SALPINGO OOPHORECTOMY; OMENTAL BIOPSY;  Surgeon: [name], MD;  Location: [facility];  Service: Gynecology;  Laterality: Bilateral;   SENTINEL NODE BIOPSY N/A 04/11/2021   Procedure: SENTINEL NODE BIOPSY;  Surgeon: [name], MD;  Location: [facility];  Service: Gynecology;  Laterality: N/A;   SEPTOPLASTY     & sinus surgeries 1980s-1990s   THORACOTOMY Left 07/14/2004   TRIGGER FINGER RELEASE Bilateral    "I've had OR on q finger" (05/21/2013)   TRIGGER FINGER RELEASE Right 07/14/1999   index and little fingers   TRIGGER FINGER RELEASE Right 07/17/2002   long and ring fingers   TRIGGER FINGER RELEASE Left 07/31/2002   little, ring and index fingers   VIDEO ASSISTED THORACOSCOPY (VATS)/ LOBECTOMY Left 07/14/2004   upper tri-segmentectomy   WISDOM TOOTH EXTRACTION     "3"    REVIEW OF SYSTEMS:  A comprehensive review of systems was negative except for: Constitutional: positive for fatigue   PHYSICAL EXAMINATION: General appearance: alert, cooperative, fatigued, and no  distress Head: Normocephalic, without obvious abnormality, atraumatic Neck: no adenopathy Lymph nodes: Cervical, supraclavicular, and axillary nodes normal. Resp: clear to auscultation bilaterally Back: symmetric, no curvature. ROM normal. No CVA tenderness. Cardio: regular rate and rhythm, S1, S2 normal, no murmur, click, rub or gallop GI: soft, non-tender; bowel sounds normal; no masses,  no organomegaly Extremities: extremities normal, atraumatic, no cyanosis or edema  ECOG PERFORMANCE STATUS: 1 - Symptomatic but completely ambulatory  Blood pressure (!) 140/84, pulse 75, temperature 98.1 F (36.7 C), temperature source Oral, resp. rate 16, weight 156 lb 1.6 oz (70.8 kg), SpO2 100 %.  LABORATORY DATA: Lab Results  Component Value Date   WBC 4.3 03/06/2022   HGB 13.1 03/06/2022   HCT 38.6 03/06/2022   MCV 93.9 03/06/2022   PLT 272 03/06/2022      Chemistry      Component Value Date/Time   NA 140 03/06/2022 1005   NA 145 (H) 05/06/2019 1021   NA 141 10/29/2016 0742   K 4.3 03/06/2022 1005   K 4.1 10/29/2016 0742   CL 104 03/06/2022 1005   CL 108 (H) 04/28/2012 1008   CO2 31 03/06/2022 1005   CO2 26 10/29/2016 0742   BUN 13 03/06/2022 1005   BUN 15 05/06/2019 1021   BUN 21.1 10/29/2016 0742   CREATININE 0.73 03/06/2022 1005   CREATININE 0.9 10/29/2016 0742      Component Value Date/Time   CALCIUM 9.9 03/06/2022 1005   CALCIUM 9.0 10/29/2016 0742   ALKPHOS 84 03/06/2022 1005   ALKPHOS 49 10/29/2016 0742   AST 16 03/06/2022 1005   AST 15 10/29/2016 0742   ALT 13 03/06/2022 1005   ALT 12 10/29/2016 0742   BILITOT 0.4 03/06/2022 1005   BILITOT 0.32 10/29/2016 0742       RADIOGRAPHIC STUDIES: CT CHEST ABDOMEN PELVIS W CONTRAST  Result Date: 02/26/2022 CLINICAL DATA:  Uterine/cervical cancer evaluate treatment response. Remote history of lung cancer and right breast cancer. * Tracking Code: BO * EXAM: CT CHEST, ABDOMEN, AND PELVIS WITH CONTRAST TECHNIQUE:  Multidetector CT imaging of the chest, abdomen and pelvis was performed following the standard protocol during bolus administration of intravenous contrast. RADIATION DOSE REDUCTION: This exam was performed according to the departmental dose-optimization program which includes automated exposure control, adjustment of the mA and/or kV according to patient size and/or use of iterative reconstruction technique. CONTRAST:  131mL OMNIPAQUE IOHEXOL 300 MG/ML  SOLN COMPARISON:  Multiple priors including CT September 20, 2021. FINDINGS:  CT CHEST FINDINGS Cardiovascular: Aortic atherosclerosis. No central pulmonary embolus on this nondedicated study. Normal size heart. Coronary artery calcifications. Mediastinum/Nodes: Heterogeneous 18 mm right thyroid nodule present dating back to 2016 and previously evaluated by thyroid ultrasound November 16, 2021. No supraclavicular adenopathy. No suspicious mediastinal, hilar or axillary lymph nodes. The esophagus is grossly unremarkable. Lungs/Pleura: Surgical changes in the left upper lobe. Increased size of branching opacities in the left upper lobe previously indexed opacities measure 12 mm on image 34/6 previously 6 mm and 1.6 x 1.0 cm on image 58/6, unchanged. Mild radiation induced subpleural fibrosis in the anterior right lung. Moderate centrilobular emphysema. No pleural effusion. Musculoskeletal: Prior right mastectomy with breast reconstruction. Partially visualized cervical fusion hardware. No aggressive lytic or blastic lesion of bone. CT ABDOMEN PELVIS FINDINGS Hepatobiliary: No suspicious hepatic lesion. Gallbladder is unremarkable. No biliary ductal dilation. Pancreas: No pancreatic ductal dilation or evidence of acute inflammation. Spleen: No splenomegaly. Adrenals/Urinary Tract: Bilateral adrenal glands appear normal. No hydronephrosis. Kidneys demonstrate symmetric enhancement and excretion of contrast material. Urinary bladder is unremarkable for degree of distension.  Stomach/Bowel: Radiopaque enteric contrast material traverses the rectum. Similar apparent wall thickening of an under distended stomach for instance on image 55/2. No pathologic dilation of small or large bowel. Large volume of formed stool throughout the colon. Left-sided colonic diverticulosis without findings of acute diverticulitis. Vascular/Lymphatic: Aortic atherosclerosis. No pathologically enlarged abdominal or pelvic lymph nodes. Interval resolution of the fluid structure along the left external iliac vessels compatible with a resolved postoperative seroma. Reproductive: Prior hysterectomy without suspicious enhancing soft tissue nodularity along the vaginal cuff. No suspicious adnexal mass. Other: No significant abdominopelvic free fluid. No discrete peritoneal or omental nodularity. Musculoskeletal: Multilevel degenerative changes spine. No aggressive lytic or blastic lesion of bone. IMPRESSION: 1. Prior hysterectomy without evidence of local recurrence. 2. No convincing evidence of metastatic disease within the chest, abdomen or pelvis. 3. Increased size of branching opacities in the left upper lobe, again nonspecific and felt most likely to reflect progressive mucoid impaction. However while felt less likely recurrent disease is not excluded. PET-CT would likely be helpful as both infectious mucoid impaction and local recurrence would likely be radiotracer avid. Further evaluation with direct tissue sampling versus follow-up CT of the chest in 3-6 months and discussion at multidisciplinary thoracic tumor board is suggested. 4. Similar apparent wall thickening of an under distended stomach, nonspecific consider further evaluation with upper endoscopy if clinically indicated. 5. Large volume of formed stool throughout the colon. Correlate for constipation. 6. Left-sided colonic diverticulosis without findings of acute diverticulitis. 7. Aortic Atherosclerosis (ICD10-I70.0) and Emphysema (ICD10-J43.9).  Electronically Signed   By: Dahlia Bailiff M.D.   On: 02/26/2022 15:01    ASSESSMENT AND PLAN:  This is a very pleasant 71 years old white female with stage IIA non-small cell lung cancer as well as recurrent breast ductal carcinoma in situ. The patient is status post right lumpectomy followed by adjuvant radiotherapy.  She was also treated with adjuvant tamoxifen for more than 9 years. The patient was diagnosed in January 2023 with endometrial carcinoma status post total abdominal hysterectomy with bilateral salpingo-oophorectomy under the care of [name] followed by systemic chemotherapy with carboplatin and paclitaxel under the care of Dr. Alvy Bimler. The patient is currently on observation and she has been doing fine with no concerning complaints.  Her most recent CT scan of the chest, abdomen and pelvis showed no concerning findings for disease recurrence or metastasis. I recommended for her  to continue on observation with routine follow-up visit under the care of [name] in Dr. Sondra Come for the next few months.  I will see her back for follow-up visit in 1 year for evaluation and repeat blood work. The patient was advised to call immediately if she has any other concerning symptoms in the interval.  All questions were answered. The patient knows to call the clinic with any problems, questions or concerns. We can certainly see the patient much sooner if necessary.   Disclaimer: This note was dictated with voice recognition software. Similar sounding words can inadvertently be transcribed and may be missed upon review.

## 2022-03-15 ENCOUNTER — Ambulatory Visit
Admission: RE | Admit: 2022-03-15 | Discharge: 2022-03-15 | Disposition: A | Discharge status: Home / Self Care | Payer: Medicare Other | Source: Ambulatory Visit | Attending: Family Medicine | Admitting: Family Medicine

## 2022-03-15 DIAGNOSIS — M85852 Other specified disorders of bone density and structure, left thigh: Secondary | ICD-10-CM

## 2022-04-07 ENCOUNTER — Ambulatory Visit: Payer: Medicare Other | Admitting: Radiation Oncology

## 2022-06-07 ENCOUNTER — Inpatient Hospital Stay: Payer: Medicare Other | Attending: Radiation Oncology | Admitting: Gynecologic Oncology

## 2022-06-07 ENCOUNTER — Ambulatory Visit (HOSPITAL_COMMUNITY)
Admission: RE | Admit: 2022-06-07 | Discharge: 2022-06-07 | Disposition: A | Discharge status: Home / Self Care | Payer: Medicare Other | Source: Ambulatory Visit | Attending: Gynecologic Oncology | Admitting: Gynecologic Oncology

## 2022-06-07 ENCOUNTER — Encounter: Payer: Self-pay | Admitting: Gynecologic Oncology

## 2022-06-07 VITALS — BP 142/74 | HR 103 | Temp 98.6°F | Wt 159.8 lb

## 2022-06-07 DIAGNOSIS — Z08 Encounter for follow-up examination after completed treatment for malignant neoplasm: Secondary | ICD-10-CM | POA: Insufficient documentation

## 2022-06-07 DIAGNOSIS — Z9221 Personal history of antineoplastic chemotherapy: Secondary | ICD-10-CM | POA: Diagnosis not present

## 2022-06-07 DIAGNOSIS — Z923 Personal history of irradiation: Secondary | ICD-10-CM | POA: Insufficient documentation

## 2022-06-07 DIAGNOSIS — Z85118 Personal history of other malignant neoplasm of bronchus and lung: Secondary | ICD-10-CM | POA: Insufficient documentation

## 2022-06-07 DIAGNOSIS — Z8542 Personal history of malignant neoplasm of other parts of uterus: Secondary | ICD-10-CM | POA: Diagnosis not present

## 2022-06-07 DIAGNOSIS — Z90722 Acquired absence of ovaries, bilateral: Secondary | ICD-10-CM | POA: Diagnosis not present

## 2022-06-07 DIAGNOSIS — C541 Malignant neoplasm of endometrium: Secondary | ICD-10-CM

## 2022-06-07 DIAGNOSIS — Z90710 Acquired absence of both cervix and uterus: Secondary | ICD-10-CM | POA: Diagnosis not present

## 2022-06-07 MED ORDER — IOHEXOL 300 MG/ML  SOLN
75.0000 mL | Freq: Once | INTRAMUSCULAR | Status: AC | PRN
  Administered 2022-06-07: 75 mL via INTRAVENOUS

## 2022-06-07 NOTE — Progress Notes (Signed)
Gynecologic Oncology Return Clinic Visit  06/07/22  Reason for Visit: Surveillance in the setting of uterine cancer   Treatment History: Oncology History Overview Note  CA-125 on 04/06/21: 10.2 High grade serous, Her 2 neg Genetics from 04/30/18: VUS   MMR IHC intact   History of lung cancer (Resolved)  04/28/2012 Initial Diagnosis   Lung cancer, diagnosed in March of 2006 stage II a status post left trisegmentectomy with lymph node dissection followed by 4 cycles of adjuvant chemotherapy   05/06/2018 Genetic Testing   MEN1 c.875C>T and MSH3 c.1366G>A VUS identified on the multicancer gene panel.  The Multi-Gene Panel offered by [facility] includes sequencing and/or deletion duplication testing of the following 84 genes: AIP, ALK, APC, ATM, AXIN2,BAP1,  BARD1, BLM, BMPR1A, BRCA1, BRCA2, BRIP1, CASR, CDC73, CDH1, CDK4, CDKN1B, CDKN1C, CDKN2A (p14ARF), CDKN2A (p16INK4a), CEBPA, CHEK2, CTNNA1, DICER1, DIS3L2, EGFR (c.2369C>T, p.Thr790Met variant only), EPCAM (Deletion/duplication testing only), FH, FLCN, GATA2, GPC3, GREM1 (Promoter region deletion/duplication testing only), HOXB13 (c.251G>A, p.Gly84Glu), HRAS, KIT, MAX, MEN1, MET, MITF (c.952G>A, p.Glu318Lys variant only), MLH1, MSH2, MSH3, MSH6, MUTYH, NBN, NF1, NF2, NTHL1, PALB2, PDGFRA, PHOX2B, PMS2, POLD1, POLE, POT1, PRKAR1A, PTCH1, PTEN, RAD50, RAD51C, RAD51D, RB1, RECQL4, RET, RUNX1, SDHAF2, SDHA (sequence changes only), SDHB, SDHC, SDHD, SMAD4, SMARCA4, SMARCB1, SMARCE1, STK11, SUFU, TERC, TERT, TMEM127, TP53, TSC1, TSC2, VHL, WRN and WT1.  The report date is May 06, 2018.   History of ductal carcinoma in situ (DCIS) of breast  04/28/2012 Initial Diagnosis   Ductal carcinoma in situ of right breast diagnosed in December of 2013, status post right lumpectomy in January of 2014   05/06/2018 Genetic Testing   MEN1 c.875C>T and MSH3 c.1366G>A VUS identified on the multicancer gene panel.  The Multi-Gene Panel offered by [facility] includes  sequencing and/or deletion duplication testing of the following 84 genes: AIP, ALK, APC, ATM, AXIN2,BAP1,  BARD1, BLM, BMPR1A, BRCA1, BRCA2, BRIP1, CASR, CDC73, CDH1, CDK4, CDKN1B, CDKN1C, CDKN2A (p14ARF), CDKN2A (p16INK4a), CEBPA, CHEK2, CTNNA1, DICER1, DIS3L2, EGFR (c.2369C>T, p.Thr790Met variant only), EPCAM (Deletion/duplication testing only), FH, FLCN, GATA2, GPC3, GREM1 (Promoter region deletion/duplication testing only), HOXB13 (c.251G>A, p.Gly84Glu), HRAS, KIT, MAX, MEN1, MET, MITF (c.952G>A, p.Glu318Lys variant only), MLH1, MSH2, MSH3, MSH6, MUTYH, NBN, NF1, NF2, NTHL1, PALB2, PDGFRA, PHOX2B, PMS2, POLD1, POLE, POT1, PRKAR1A, PTCH1, PTEN, RAD50, RAD51C, RAD51D, RB1, RECQL4, RET, RUNX1, SDHAF2, SDHA (sequence changes only), SDHB, SDHC, SDHD, SMAD4, SMARCA4, SMARCB1, SMARCE1, STK11, SUFU, TERC, TERT, TMEM127, TP53, TSC1, TSC2, VHL, WRN and WT1.  The report date is May 06, 2018.   Endometrial carcinoma ([place])  01/27/2021 Initial Diagnosis   Patient began having vaginal spotting on November 10th.  She had 4 days of spotting which she describes as seeing red when she wiped and having a little brown discharge on her liner.  Bleeding then stopped completely.  She saw her primary care provider and underwent an ultrasound.  Ultrasound showed thickened endometrium measuring 14 mm.  She then started bleeding again on December 14 and had a week of bleeding with a little bit more red blood this time.  She ultimately saw OB/GYN and had an endometrial biopsy.  This was first attempted however secondary to cervical stenosis, patient returned the following day after using intravaginal misoprostol.  Endometrial biopsy on 12/22 revealed FIGO grade 2/3 endometrial cancer.   02/27/2021 Imaging   US pelvis Thickened endometrium with possible 14 mm endometrial mass. In the setting of post-menopausal bleeding, endometrial sampling is indicated to exclude carcinoma   03/06/2021 Initial Biopsy   Endometrial biopsy  revealed FIGO grade 2/3 endometrial cancer   03/24/2021 Initial Diagnosis   Endometrial carcinoma ([place])   03/30/2021 Imaging   CT A/P: 1. Abnormal thickening of the endometrium, as seen on prior ultrasound and consistent with recently diagnosed endometrial malignancy. 2. No evidence of lymphadenopathy or metastatic disease in the abdomen or pelvis. 3. Subcapsular hyperenhancing lesion of the lateral inferior right lobe of the liver, hepatic segment VI is only very faintly appreciated on this portal venous phase contrast examination, better characterized as a benign focal nodular hyperplasia by prior MR. 4. Diverticulosis.   04/11/2021 Surgery   Robotic-assisted laparoscopic total hysterectomy with bilateral salpingoophorectomy, SLN biopsy, omental biopsy, cystoscopy   Findings: On EUA, small mobile uterus. On intra-abdominal entry, normal upper abdominal survey. Normal omentum, small bowel. Large bowels with some filmy adhesions to the left pelvic sidewall and the left adhesions. Filmy adhesions also noted from the sigmoid to the posterior uterus (some of the adhesions biopsied). Uterus 8cm and normal in appearance. Atrophic appearing adnexa. Mapping successful to bilateral SLNs. No obvious adenopathy. Minimal adhesions of the bladder to the LUS. No intra-abdominal or pelvic evidence of disease. Given concentrated appearing urine and low UOP, cystoscopy was performed. Bladder dome intact (bubbles seen). Good efflux noted from bilateral ureteral orifices.    04/11/2021 Pathologic Stage   Stage IA serous uterine cancer No LVI, negative SLNs Omentum negative, washings nondiagnostic (insufficient cellularity) HER2 negative   04/18/2021 Cancer Staging   Staging form: Corpus Uteri - Carcinoma and Carcinosarcoma, AJCC 8th Edition - Pathologic stage from 04/18/2021: FIGO Stage IA (pT1a, pN0, cM0) - Signed by Heath Lark, MD on 04/18/2021 Stage prefix: Initial diagnosis   05/05/2021 Procedure    Successful placement of a RIGHT internal jugular approach power injectable Port-A-Cath.   The tip of the catheter is positioned within the proximal RIGHT atrium. The catheter is ready for immediate use   05/09/2021 - 08/22/2021 Chemotherapy   Patient is on Treatment Plan : UTERINE Carboplatin AUC 6 / Paclitaxel q21d     07/27/2021 - 08/17/2021 Radiation Therapy   07/27/2021 through 08/17/2021 Site Technique Total Dose (Gy) Dose per Fx (Gy) Completed Fx Beam Energies  Vagina: Pelvis HDR-brachy 30/30 6 5/5 Ir-192       09/21/2021 Imaging   CT CHEST IMPRESSION   1. Left upper lobe surgical changes. Increased branching soft tissue density compared to 2016. Most likely related to progressive mucoid impaction. Locally recurrent neoplasm is felt unlikely, especially given remote lung cancer history. This could be re-evaluated with chest CT at 3 to 6 months. PET would likely be unhelpful, as either infectious mucoid impaction or local recurrence would be tracer avid. 2. No typical findings of uterine cancer metastatic disease within the chest. 3. Aortic atherosclerosis (ICD10-I70.0), coronary artery atherosclerosis and emphysema (ICD10-J43.9).   CT ABDOMEN AND PELVIS IMPRESSION   1. Interval hysterectomy, without findings of residual or metastatic disease. 2. Apparent gastric wall thickening could be due to underdistention. Correlate with symptoms of gastritis. 3. Left pelvic sidewall seroma/lymphangioma.         Interval History: Doing well.  Denies any abdominal or pelvic pain.  Increased her calcium and has noticed some mild constipation although notes that she still has 1 or 2 bowel movements almost daily.  Urinary function is at baseline.  Denies any vaginal bleeding or discharge.  Continues to use her vaginal dilator.  Past Medical/Surgical History: Past Medical History:  Diagnosis Date   Anxiety    Breast cancer ([place])  2014 and 2015   Cancer ([place])    lung cancer    DDD (degenerative  disc disease)    neck   Dry eye    Eczema    Family history of breast cancer    Family history of stomach cancer    GERD (gastroesophageal reflux disease)    occasional - no current med.   Goiter    History of breast cancer    History of lung cancer    non-small cell - left upper lobe   History of radiation therapy    Vagina- 045/04/23-05/23/23- Dr. Gery Pray   IBS (irritable bowel syndrome)    Non-allergic rhinitis    sinusitis   Osteoarthritis    Personal history of radiation therapy 2014   PONV (postoperative nausea and vomiting)    Radiation 07/03/12-07/09/12   Right upper lung 5400 cGy   Thyroid nodule, toxic or with hyperthyroidism    no current med.   Urinary, incontinence, stress female    Vasomotor rhinitis    Vertigo     Past Surgical History:  Procedure Laterality Date   ANTERIOR CERVICAL DECOMP/DISCECTOMY FUSION N/A 12/22/2019   Procedure: Cervical four-five Cervical five-six Cervical six-seven Anterior cervical decompression/discectomy/fusion;  Surgeon: Erline Levine, MD;  Location: [facility];  Service: Neurosurgery;  Laterality: N/A;   APPENDECTOMY  1971   BREAST LUMPECTOMY Right 2014   BREAST RECONSTRUCTION WITH PLACEMENT OF TISSUE EXPANDER AND FLEX HD (ACELLULAR HYDRATED DERMIS) Right 05/20/2013   Procedure: IMMEDIATE RIGHT BREAST RECONSTRUCTION WITH LATISSAMUS MUSCLE FLAP AND PLACEMENT OF TISSUE EXPANDER TO RIGHT BREAST;  Surgeon: Theodoro Kos, DO;  Location: [facility];  Service: Plastics;  Laterality: Right;   BREAST REDUCTION WITH MASTOPEXY Left 10/22/2013   Procedure: BREAST REDUCTION/MASTOPEXY TO LEFT BREAST WITH LIPOSUCTION/FAT GRAFTING FOR SYMMETRY;  Surgeon: Theodoro Kos, DO;  Location: [facility];  Service: Plastics;  Laterality: Left;   CYSTOSCOPY N/A 04/11/2021   Procedure: CYSTOSCOPY;  Surgeon: [name], MD;  Location: [facility];  Service: Gynecology;  Laterality: N/A;   DE QUERVAIN'S RELEASE Left 07/31/2002   FRACTURE SURGERY Left  2019   Torn meniscus; bone fragments removed   ganglion cyst R hand     1980s   IR IMAGING GUIDED PORT INSERTION  05/04/2021   IR REMOVAL TUN ACCESS W/ PORT W/O FL MOD SED  11/29/2021   IR US GUIDE VASC ACCESS RIGHT  05/04/2021   KNEE SURGERY Left    torn meniscus   LUNG REMOVAL, PARTIAL     2014   MASTECTOMY Right    2015   MASTECTOMY W/ SENTINEL NODE BIOPSY Right 05/20/2013   Procedure: RIGHT SIMPLEMASTECTOMY AND SENTINEL NODE   Sampling;  Surgeon: Marcello Moores A. Cornett, MD;  Location: [facility];  Service: General;  Laterality: Right;   MOUTH SURGERY Right may 2014   "keratocystic odotogenic tumor" - X 2   NASAL SINUS SURGERY  ?1988-1990   "at least 3" (05/21/2013)   PARTIAL MASTECTOMY WITH NEEDLE LOCALIZATION  04/10/2012   Procedure: PARTIAL MASTECTOMY WITH NEEDLE LOCALIZATION;  Surgeon: Marcello Moores A. Cornett, MD;  Location: [facility];  Service: General;  Laterality: Right;   REDUCTION MAMMAPLASTY Left 2015   REMOVAL OF TISSUE EXPANDER AND PLACEMENT OF IMPLANT Right 10/22/2013   Procedure: REMOVAL OF RIGHT TISSUE EXPANDER WITH PLACEMENT OF RIGHT BREAST IMPLANT;  Surgeon: Theodoro Kos, DO;  Location: [facility];  Service: Plastics;  Laterality: Right;   ROBOTIC ASSISTED BILATERAL SALPINGO OOPHERECTOMY Bilateral 04/11/2021  Procedure: XI ROBOTIC ASSISTED HYSTERECTOMY WITH BILATERAL SALPINGO OOPHORECTOMY; OMENTAL BIOPSY;  Surgeon: [name], MD;  Location: [facility];  Service: Gynecology;  Laterality: Bilateral;   SENTINEL NODE BIOPSY N/A 04/11/2021   Procedure: SENTINEL NODE BIOPSY;  Surgeon: [name], MD;  Location[facility];  Service: Gynecology;  Laterality: N/A;   SEPTOPLASTY     & sinus surgeries 1980s-1990s   THORACOTOMY Left 07/14/2004   TRIGGER FINGER RELEASE Bilateral    "I've had OR on q finger" (05/21/2013)   TRIGGER FINGER RELEASE Right 07/14/1999   index and little fingers   TRIGGER FINGER RELEASE Right 07/17/2002   long and ring fingers    TRIGGER FINGER RELEASE Left 07/31/2002   little, ring and index fingers   VIDEO ASSISTED THORACOSCOPY (VATS)/ LOBECTOMY Left 07/14/2004   upper tri-segmentectomy   WISDOM TOOTH EXTRACTION     "3"    Family History  Problem Relation Age of Onset   Anesthesia problems Mother        post-op N/V   Thyroid nodules Mother    Other Mother        DDD, spinal stenosis   High blood pressure Mother    Hearing loss Mother    Leukemia Father        CML, d. 66s   High blood pressure Father    Hearing loss Father    Breast cancer Sister        BRCA testing in 2006 timeframe is neg   Thyroid nodules Sister    Thyroid nodules Sister    Hearing loss Brother    Breast cancer Maternal Aunt        dx >50   Thyroid nodules Maternal Aunt    Thyroid nodules Maternal Aunt    Hearing loss Paternal Aunt    Hearing loss Paternal Uncle    Stomach cancer Maternal Grandmother    Anesthesia problems Daughter        post-op N/V   Stomach cancer Maternal Great-grandmother        assumed stomach cancer   Prostate cancer Neg Hx    Pancreatic cancer Neg Hx    Endometrial cancer Neg Hx    Ovarian cancer Neg Hx    Colon cancer Neg Hx     Social History   Socioeconomic History   Marital status: Married    Spouse name: Not on file   Number of children: 1   Years of education: Not on file   Highest education level: Not on file  Occupational History    Employer: [employer]  Tobacco Use   Smoking status: Former    Types: Cigarettes    Quit date: 05/06/2004    Years since quitting: 18.0   Smokeless tobacco: Never  Vaping Use   Vaping Use: Never used  Substance and Sexual Activity   Alcohol use: Yes    Alcohol/week: 7.0 standard drinks of alcohol    Types: 7 Glasses of wine per week    Comment: 1 glass wine daily   Drug use: Never   Sexual activity: Not Currently  Other Topics Concern   Not on file  Social History Narrative   Lives at home with spouse   Right handed   Caffeine:  2-3 cups of coffee/day   Social Determinants of Health   Financial Resource Strain: Not on file  Food Insecurity: Not on file  Transportation Needs: Not on file  Physical Activity: Not on file  Stress: Not on file  Social Connections: Not on file    Current Medications:  Current Outpatient Medications:    acetaminophen (TYLENOL) 500 MG tablet, Take 1,000 mg by mouth at bedtime as needed (Arthritis pain)., Disp: , Rfl:    cycloSPORINE (RESTASIS) 0.05 % ophthalmic emulsion, Place 1 drop into both eyes 2 (two) times daily. , Disp: , Rfl:    esomeprazole (NEXIUM) 20 MG capsule, Take 20 mg by mouth daily., Disp: , Rfl:    fluocinonide gel (LIDEX) [id number] %, Apply 1 application topically daily as needed (canker sores). , Disp: , Rfl:    fluticasone (FLONASE) 50 MCG/ACT nasal spray, Place 2 sprays into both nostrils daily. , Disp: , Rfl:    Lactobacillus (ACIDOPHILUS PO), Take 5 mg by mouth daily., Disp: , Rfl:    Magnesium 250 MG TABS, Take by mouth., Disp: , Rfl:    Multiple Vitamin (MULTIVITAMIN) capsule, Take 1 capsule by mouth in the morning. , Disp: , Rfl:    Olopatadine HCl 0.2 % SOLN, Place 1 drop into both eyes daily as needed (Allergies/Seasonal)., Disp: , Rfl:    Polyethyl Glycol-Propyl Glycol (SYSTANE) 0.4-0.3 % SOLN, Place 1 drop into both eyes daily as needed (Dry eye)., Disp: , Rfl:    vitamin E 400 UNIT capsule, Take 400 Units by mouth daily. , Disp: , Rfl:   Review of Systems: Denies appetite changes, fevers, chills, fatigue, unexplained weight changes. Denies hearing loss, neck lumps or masses, mouth sores, ringing in ears or voice changes. Denies cough or wheezing.  Denies shortness of breath. Denies chest pain or palpitations. Denies leg swelling. Denies abdominal distention, pain, blood in stools, constipation, diarrhea, nausea, vomiting, or early satiety. Denies pain with intercourse, dysuria, frequency, hematuria or incontinence. Denies hot flashes, pelvic pain, vaginal  bleeding or vaginal discharge.   Denies joint pain, back pain or muscle pain/cramps. Denies itching, rash, or wounds. Denies dizziness, headaches, numbness or seizures. Denies swollen lymph nodes or glands, denies easy bruising or bleeding. Denies anxiety, depression, confusion, or decreased concentration.  Physical Exam: BP (!) 142/74 (BP Location: Left Arm, Patient Position: Sitting)   Pulse (!) 103   Temp 98.6 F (37 C) (Oral)   Wt 159 lb 12.8 oz (72.5 kg)   SpO2 100%   BMI 28.31 kg/m  General: Alert, oriented, no acute distress.  HEENT: Normocephalic, atraumatic. Sclera anicteric.  Chest: Clear to auscultation bilaterally. No wheezes, rhonchi, or rales. Cardiovascular: Regular rate and rhythm, no murmurs, rubs, or gallops.  Abdomen: Normoactive bowel sounds. Soft, nondistended, nontender to palpation. No masses or hepatosplenomegaly appreciated.  Well-healed incisions. Extremities: Grossly normal range of motion. Warm, well perfused.   Skin: No rashes or lesions.  GU: Normal external female genitalia.  Speculum exam shows mildly atrophic vaginal mucosa, some radiation changes noted at the vaginal apex.  No lesions, bleeding, or discharge. On bimanual exam, cuff intact, no nodularity or masses.  Exam findings confirmed on rectovaginal exam.  Laboratory & Radiologic Studies: CT C/A/P 02/26/22: 1. Prior hysterectomy without evidence of local recurrence. 2. No convincing evidence of metastatic disease within the chest, abdomen or pelvis. 3. Increased size of branching opacities in the left upper lobe, again nonspecific and felt most likely to reflect progressive mucoid impaction. However while felt less likely recurrent disease is not excluded. PET-CT would likely be helpful as both infectious mucoid impaction and local recurrence would likely be radiotracer avid. Further evaluation with direct tissue sampling versus follow-up CT of the chest in 3-6 months and discussion at  multidisciplinary thoracic tumor board is suggested. 4. Similar apparent wall thickening of an under distended stomach, nonspecific consider further evaluation with upper endoscopy if clinically indicated. 5. Large volume of formed stool throughout the colon. Correlate for constipation. 6. Left-sided colonic diverticulosis without findings of acute diverticulitis. 7. Aortic Atherosclerosis (ICD10-I70.0) and Emphysema (ICD10-J43.9).  Assessment & Plan: AVANGELINE SWERDLOW is a 72 y.o. woman with Stage IA serous uterine cancer who presents for surveillance. Completed adjuvant chemotherapy and vaginal brachytherapy in 07/2021. HER2 negative. MMR intact.  Patient is doing well.  She has completed adjuvant therapy and is NED on exam today.   To follow-up lung findings from her December scan, CT chest performed today.  Official report still pending.  Per NCCN surveillance recommendations, in the setting of high risk endometrial cancer, we will plan on visits every 3 months for the first 2-3 years.  These will alternate between my office and radiation oncology. The patient is scheduled to see Dr. Sondra Come in early June.  I have asked her to call over the summer to schedule a visit to see me in September.   We reviewed signs and symptoms that would be concerning for cancer recurrence and I stressed the importance of calling if she develops any of these between visits.   Encouraged her to continue using vaginal dilator.  22 minutes of total time was spent for this patient encounter, including preparation, face-to-face counseling with the patient and coordination of care, and documentation of the encounter.  Jeral Pinch, MD  Division of Gynecologic Oncology  Department of Obstetrics and Gynecology  [facility]

## 2022-06-07 NOTE — Patient Instructions (Signed)
It was good to see you today.  I do not see or feel any evidence of cancer recurrence on your exam.  I will see you for follow-up in 6 months. Please call my office after your visit with Dr. Sondra Come to schedule a visit to see me in September.   As always, if you develop any new and concerning symptoms before your next visit, please call to see me sooner.

## 2022-08-29 NOTE — Progress Notes (Signed)
[name] is here today for follow up post radiation to the pelvic.  They completed their radiation on: 08/17/21   Does the patient complain of any of the following:  Pain:*** Abdominal bloating: *** Diarrhea/Constipation: *** Nausea/Vomiting: *** Vaginal Discharge: *** Blood in Urine or Stool: *** Urinary Issues (dysuria/incomplete emptying/ incontinence/ increased frequency/urgency): *** Does patient report using vaginal dilator 2-3 times a week and/or sexually active 2-3 weeks: *** Post radiation skin changes: ***   Additional comments if applicable:

## 2022-08-29 NOTE — Progress Notes (Signed)
Radiation Oncology         [phone] ________________________________  Name: TAELIN RELAFORD MRN: [id number]  Date: 08/30/2022  DOB: 1951/03/21  Follow-Up Visit Note  CC: [name], MD  Carver Fila, MD  No diagnosis found.  Diagnosis:   The encounter diagnosis was Endometrial carcinoma (HCC).   FIGO Stage IA (pT1a, pN0, cM0) Endometrial Cancer, high-grade serous  Interval Since Last Radiation: 1 year and 12 days  2) Intent: Curative Radiation Treatment Dates: 07/27/2021 through 08/17/2021 Site Technique Total Dose (Gy) Dose per Fx (Gy) Completed Fx Beam Energies  Vagina: Pelvis HDR-brachy 30/30 6 5/5 Ir-192   1) DIAGNOSIS: Stage 0 (Tis N0 M0) multifocal DCIS of the right breast INDICATION FOR TREATMENT: Curative TREATMENT DATES: 05/22/2012 through 07/04/2012                                                     SITE/DOSE:  Right breast 5000 cGy 25 sessions right breast boost 1000 cGy 5 sessions                        BEAMS/ENERGY:  Tangential fields to the right breast, 6 MV photons. Electron beam boost   15 MEV electrons  Narrative:  The patient returns today for routine 6 month follow-up. She was last seen here for follow-up on 02/26/22.   Since her last visit, the patient followed up with Dr. Arbutus Ped on 03/06/22 to review recent imaging for observation. To review, she had a CT CAP performed on 02/26/22 which showed no evidence of local recurrence s/p hysterectomy and no evidence of metastatic disease in the chest, abdomen, or pelvis. CT did demonstrate an increase in size of nonspecific branching opacities in the LUL which most likely reflect progressive mucoid impaction, although the less likely differential of recurrent disease (history of non-small cell lung cancer) can not be entirely excluded. Dr. Arbutus Ped reviewed these findings with the patient and recommends that she continue on observation with routine follow-up visits with Dr. Pricilla Holm and myself. She will now  follow-up with Dr. Arbutus Ped on an annual basis.                Other pertinent imaging performed in the interval includes a DXA for bone mineral density on 03/15/22 which showed findings consistent with osteoporosis.          In more recent history, the patient followed up with Dr. Pricilla Holm on 06/07/22. During which time, the patient denied any symptoms concerning for disease recurrence.   Regarding the LUL findings on her CT from this past December, Dr. Pricilla Holm recommended a repeat chest CT. Subsequent chest CT on 06/07/22 demonstrated stable findings pertaining to the LUL nodular process as well as to a sub-centimeter nodule in the RML. A stable right sided thyroid nodule was also appreciated.   ***  Allergies:  is allergic to augmentin [amoxicillin-pot clavulanate], morphine and codeine, amoxicillin, asa [aspirin], banana, ceftin [cefuroxime axetil], citrus, lactose intolerance (gi), orange oil, penicillins, cefuroxime, clavulanic acid, tetanus-diphth-acell pertussis, and tape.  Meds: Current Outpatient Medications  Medication Sig Dispense Refill   acetaminophen (TYLENOL) 500 MG tablet Take 1,000 mg by mouth at bedtime as needed (Arthritis pain).     cycloSPORINE (RESTASIS) 0.05 % ophthalmic emulsion Place 1 drop into both eyes 2 (two) times daily.  esomeprazole (NEXIUM) 20 MG capsule Take 20 mg by mouth daily.     fluocinonide gel (LIDEX) 0.05 % Apply 1 application topically daily as needed (canker sores).      fluticasone (FLONASE) 50 MCG/ACT nasal spray Place 2 sprays into both nostrils daily.      Lactobacillus (ACIDOPHILUS PO) Take 5 mg by mouth daily.     Magnesium 250 MG TABS Take by mouth.     Multiple Vitamin (MULTIVITAMIN) capsule Take 1 capsule by mouth in the morning.      Olopatadine HCl 0.2 % SOLN Place 1 drop into both eyes daily as needed (Allergies/Seasonal).     Polyethyl Glycol-Propyl Glycol (SYSTANE) 0.4-0.3 % SOLN Place 1 drop into both eyes daily as needed (Dry eye).      vitamin E 400 UNIT capsule Take 400 Units by mouth daily.      No current facility-administered medications for this encounter.    Physical Findings: The patient is in no acute distress. Patient is alert and oriented.  vitals were not taken for this visit. .  No significant changes. Lungs are clear to auscultation bilaterally. Heart has regular rate and rhythm. No palpable cervical, supraclavicular, or axillary adenopathy. Abdomen soft, non-tender, normal bowel sounds.  On pelvic examination the external genitalia were unremarkable. A speculum exam was performed. There are no mucosal lesions noted in the vaginal vault. A Pap smear was obtained of the proximal vagina. On bimanual and rectovaginal examination there were no pelvic masses appreciated. ***   Lab Findings: Lab Results  Component Value Date   WBC 4.3 03/06/2022   HGB 13.1 03/06/2022   HCT 38.6 03/06/2022   MCV 93.9 03/06/2022   PLT 272 03/06/2022    Radiographic Findings: No results found.  Impression: The encounter diagnosis was Endometrial carcinoma (HCC).   FIGO Stage IA (pT1a, pN0, cM0) Endometrial Cancer, high-grade serous  The patient is recovering from the effects of radiation.  ***  Plan:  ***   *** minutes of total time was spent for this patient encounter, including preparation, face-to-face counseling with the patient and coordination of care, physical exam, and documentation of the encounter. ____________________________________  Billie Lade, PhD, MD  This document serves as a record of services personally performed by Antony Blackbird, MD. It was created on his behalf by Neena Rhymes, a trained medical scribe. The creation of this record is based on the scribe's personal observations and the provider's statements to them. This document has been checked and approved by the attending provider.

## 2022-08-30 ENCOUNTER — Ambulatory Visit
Admission: RE | Admit: 2022-08-30 | Discharge: 2022-08-30 | Disposition: A | Discharge status: Home / Self Care | Payer: Medicare Other | Source: Ambulatory Visit | Attending: Radiation Oncology | Admitting: Radiation Oncology

## 2022-08-30 ENCOUNTER — Telehealth: Payer: Self-pay | Admitting: *Deleted

## 2022-08-30 ENCOUNTER — Other Ambulatory Visit: Payer: Self-pay

## 2022-08-30 VITALS — BP 137/69 | HR 75 | Temp 97.8°F | Resp 18 | Ht 63.0 in | Wt 162.4 lb

## 2022-08-30 DIAGNOSIS — C541 Malignant neoplasm of endometrium: Secondary | ICD-10-CM

## 2022-08-30 NOTE — Telephone Encounter (Signed)
RETURNED PATIENT'S PHONE CALL, LVM FOR A RETURN CALL 

## 2022-10-10 ENCOUNTER — Telehealth: Payer: Self-pay

## 2022-10-10 ENCOUNTER — Telehealth: Payer: Self-pay | Admitting: *Deleted

## 2022-10-10 NOTE — Telephone Encounter (Signed)
Talbert Forest from ([facility]) called office.  Pt is scheduled for a follow up on 01/03/23  at 1:30pm with Dr. Pricilla Holm.  Talbert Forest to notify pt of appointment date and time.

## 2022-10-10 NOTE — Telephone Encounter (Signed)
CALLED PATIENT TO INFORM OF FU APPT. WITH DR. Pricilla Holm ON 01-03-23- ARRIVAL TIME- 1 PM, LVM FOR A RETURN CALL

## 2022-12-13 ENCOUNTER — Other Ambulatory Visit: Payer: Self-pay | Admitting: Family Medicine

## 2022-12-13 DIAGNOSIS — Z1231 Encounter for screening mammogram for malignant neoplasm of breast: Secondary | ICD-10-CM

## 2022-12-26 ENCOUNTER — Encounter: Payer: Self-pay | Admitting: Gynecologic Oncology

## 2023-01-03 ENCOUNTER — Encounter: Payer: Self-pay | Admitting: Gynecologic Oncology

## 2023-01-03 ENCOUNTER — Inpatient Hospital Stay: Payer: Medicare Other | Attending: Gynecologic Oncology | Admitting: Gynecologic Oncology

## 2023-01-03 VITALS — BP 121/64 | HR 74 | Temp 98.4°F | Ht 64.0 in | Wt 158.8 lb

## 2023-01-03 DIAGNOSIS — Z85118 Personal history of other malignant neoplasm of bronchus and lung: Secondary | ICD-10-CM | POA: Diagnosis not present

## 2023-01-03 DIAGNOSIS — Z90722 Acquired absence of ovaries, bilateral: Secondary | ICD-10-CM | POA: Diagnosis not present

## 2023-01-03 DIAGNOSIS — Z8542 Personal history of malignant neoplasm of other parts of uterus: Secondary | ICD-10-CM | POA: Diagnosis present

## 2023-01-03 DIAGNOSIS — Z9221 Personal history of antineoplastic chemotherapy: Secondary | ICD-10-CM | POA: Diagnosis not present

## 2023-01-03 DIAGNOSIS — C541 Malignant neoplasm of endometrium: Secondary | ICD-10-CM

## 2023-01-03 DIAGNOSIS — Z90710 Acquired absence of both cervix and uterus: Secondary | ICD-10-CM | POA: Insufficient documentation

## 2023-01-03 DIAGNOSIS — Z853 Personal history of malignant neoplasm of breast: Secondary | ICD-10-CM | POA: Diagnosis not present

## 2023-01-03 DIAGNOSIS — Z923 Personal history of irradiation: Secondary | ICD-10-CM | POA: Diagnosis not present

## 2023-01-03 NOTE — Progress Notes (Signed)
Gynecologic Oncology Return Clinic Visit  01/03/23  Reason for Visit: Surveillance in the setting of uterine cancer   Treatment History: Oncology History Overview Note  CA-125 on 04/06/21: 10.2 High grade serous, Her 2 neg Genetics from 04/30/18: VUS   MMR IHC intact   History of lung cancer (Resolved)  04/28/2012 Initial Diagnosis   Lung cancer, diagnosed in March of 2006 stage II a status post left trisegmentectomy with lymph node dissection followed by 4 cycles of adjuvant chemotherapy   05/06/2018 Genetic Testing   MEN1 c.875C>T and MSH3 c.1366G>A VUS identified on the multicancer gene panel.  The Multi-Gene Panel offered by [facility] includes sequencing and/or deletion duplication testing of the following 84 genes: AIP, ALK, APC, ATM, AXIN2,BAP1,  BARD1, BLM, BMPR1A, BRCA1, BRCA2, BRIP1, CASR, CDC73, CDH1, CDK4, CDKN1B, CDKN1C, CDKN2A (p14ARF), CDKN2A (p16INK4a), CEBPA, CHEK2, CTNNA1, DICER1, DIS3L2, EGFR (c.2369C>T, p.Thr790Met variant only), EPCAM (Deletion/duplication testing only), FH, FLCN, GATA2, GPC3, GREM1 (Promoter region deletion/duplication testing only), HOXB13 (c.251G>A, p.Gly84Glu), HRAS, KIT, MAX, MEN1, MET, MITF (c.952G>A, p.Glu318Lys variant only), MLH1, MSH2, MSH3, MSH6, MUTYH, NBN, NF1, NF2, NTHL1, PALB2, PDGFRA, PHOX2B, PMS2, POLD1, POLE, POT1, PRKAR1A, PTCH1, PTEN, RAD50, RAD51C, RAD51D, RB1, RECQL4, RET, RUNX1, SDHAF2, SDHA (sequence changes only), SDHB, SDHC, SDHD, SMAD4, SMARCA4, SMARCB1, SMARCE1, STK11, SUFU, TERC, TERT, TMEM127, TP53, TSC1, TSC2, VHL, WRN and WT1.  The report date is May 06, 2018.   History of ductal carcinoma in situ (DCIS) of breast  04/28/2012 Initial Diagnosis   Ductal carcinoma in situ of right breast diagnosed in December of 2013, status post right lumpectomy in January of 2014   05/06/2018 Genetic Testing   MEN1 c.875C>T and MSH3 c.1366G>A VUS identified on the multicancer gene panel.  The Multi-Gene Panel offered by [facility] includes  sequencing and/or deletion duplication testing of the following 84 genes: AIP, ALK, APC, ATM, AXIN2,BAP1,  BARD1, BLM, BMPR1A, BRCA1, BRCA2, BRIP1, CASR, CDC73, CDH1, CDK4, CDKN1B, CDKN1C, CDKN2A (p14ARF), CDKN2A (p16INK4a), CEBPA, CHEK2, CTNNA1, DICER1, DIS3L2, EGFR (c.2369C>T, p.Thr790Met variant only), EPCAM (Deletion/duplication testing only), FH, FLCN, GATA2, GPC3, GREM1 (Promoter region deletion/duplication testing only), HOXB13 (c.251G>A, p.Gly84Glu), HRAS, KIT, MAX, MEN1, MET, MITF (c.952G>A, p.Glu318Lys variant only), MLH1, MSH2, MSH3, MSH6, MUTYH, NBN, NF1, NF2, NTHL1, PALB2, PDGFRA, PHOX2B, PMS2, POLD1, POLE, POT1, PRKAR1A, PTCH1, PTEN, RAD50, RAD51C, RAD51D, RB1, RECQL4, RET, RUNX1, SDHAF2, SDHA (sequence changes only), SDHB, SDHC, SDHD, SMAD4, SMARCA4, SMARCB1, SMARCE1, STK11, SUFU, TERC, TERT, TMEM127, TP53, TSC1, TSC2, VHL, WRN and WT1.  The report date is May 06, 2018.   Endometrial carcinoma (HCC)  01/27/2021 Initial Diagnosis   Patient began having vaginal spotting on November 10th.  She had 4 days of spotting which she describes as seeing red when she wiped and having a little brown discharge on her liner.  Bleeding then stopped completely.  She saw her primary care provider and underwent an ultrasound.  Ultrasound showed thickened endometrium measuring 14 mm.  She then started bleeding again on December 14 and had a week of bleeding with a little bit more red blood this time.  She ultimately saw OB/GYN and had an endometrial biopsy.  This was first attempted however secondary to cervical stenosis, patient returned the following day after using intravaginal misoprostol.  Endometrial biopsy on 12/22 revealed FIGO grade 2/3 endometrial cancer.   02/27/2021 Imaging   US pelvis Thickened endometrium with possible 14 mm endometrial mass. In the setting of post-menopausal bleeding, endometrial sampling is indicated to exclude carcinoma   03/06/2021 Initial Biopsy   Endometrial biopsy  revealed FIGO grade 2/3 endometrial cancer   03/24/2021 Initial Diagnosis   Endometrial carcinoma (HCC)   03/30/2021 Imaging   CT A/P: 1. Abnormal thickening of the endometrium, as seen on prior ultrasound and consistent with recently diagnosed endometrial malignancy. 2. No evidence of lymphadenopathy or metastatic disease in the abdomen or pelvis. 3. Subcapsular hyperenhancing lesion of the lateral inferior right lobe of the liver, hepatic segment VI is only very faintly appreciated on this portal venous phase contrast examination, better characterized as a benign focal nodular hyperplasia by prior MR. 4. Diverticulosis.   04/11/2021 Surgery   Robotic-assisted laparoscopic total hysterectomy with bilateral salpingoophorectomy, SLN biopsy, omental biopsy, cystoscopy   Findings: On EUA, small mobile uterus. On intra-abdominal entry, normal upper abdominal survey. Normal omentum, small bowel. Large bowels with some filmy adhesions to the left pelvic sidewall and the left adhesions. Filmy adhesions also noted from the sigmoid to the posterior uterus (some of the adhesions biopsied). Uterus 8cm and normal in appearance. Atrophic appearing adnexa. Mapping successful to bilateral SLNs. No obvious adenopathy. Minimal adhesions of the bladder to the LUS. No intra-abdominal or pelvic evidence of disease. Given concentrated appearing urine and low UOP, cystoscopy was performed. Bladder dome intact (bubbles seen). Good efflux noted from bilateral ureteral orifices.    04/11/2021 Pathologic Stage   Stage IA serous uterine cancer No LVI, negative SLNs Omentum negative, washings nondiagnostic (insufficient cellularity) HER2 negative   04/18/2021 Cancer Staging   Staging form: Corpus Uteri - Carcinoma and Carcinosarcoma, AJCC 8th Edition - Pathologic stage from 04/18/2021: FIGO Stage IA (pT1a, pN0, cM0) - Signed by Artis Delay, MD on 04/18/2021 Stage prefix: Initial diagnosis   05/05/2021 Procedure    Successful placement of a RIGHT internal jugular approach power injectable Port-A-Cath.   The tip of the catheter is positioned within the proximal RIGHT atrium. The catheter is ready for immediate use   05/09/2021 - 08/22/2021 Chemotherapy   Patient is on Treatment Plan : UTERINE Carboplatin AUC 6 / Paclitaxel q21d     07/27/2021 - 08/17/2021 Radiation Therapy   07/27/2021 through 08/17/2021 Site Technique Total Dose (Gy) Dose per Fx (Gy) Completed Fx Beam Energies  Vagina: Pelvis HDR-brachy 30/30 6 5/5 Ir-192       09/21/2021 Imaging   CT CHEST IMPRESSION   1. Left upper lobe surgical changes. Increased branching soft tissue density compared to 2016. Most likely related to progressive mucoid impaction. Locally recurrent neoplasm is felt unlikely, especially given remote lung cancer history. This could be re-evaluated with chest CT at 3 to 6 months. PET would likely be unhelpful, as either infectious mucoid impaction or local recurrence would be tracer avid. 2. No typical findings of uterine cancer metastatic disease within the chest. 3. Aortic atherosclerosis (ICD10-I70.0), coronary artery atherosclerosis and emphysema (ICD10-J43.9).   CT ABDOMEN AND PELVIS IMPRESSION   1. Interval hysterectomy, without findings of residual or metastatic disease. 2. Apparent gastric wall thickening could be due to underdistention. Correlate with symptoms of gastritis. 3. Left pelvic sidewall seroma/lymphangioma.         Interval History: Doing well.  Recently got back from a trip to [place], got COVID during her travels.  Denies any vaginal bleeding or discharge.  Reports baseline bowel bladder function.  Denies any abdominal or pelvic pain.  Past Medical/Surgical History: Past Medical History:  Diagnosis Date   Anxiety    Breast cancer (HCC)    2014 and 2015   Cancer (HCC)    lung cancer  DDD (degenerative disc disease)    neck   Dry eye    Eczema    Family history of breast cancer    Family  history of stomach cancer    GERD (gastroesophageal reflux disease)    occasional - no current med.   Goiter    History of breast cancer    History of lung cancer    non-small cell - left upper lobe   History of radiation therapy    Vagina- 045/04/23-05/23/23- Dr. Antony Blackbird   IBS (irritable bowel syndrome)    Non-allergic rhinitis    sinusitis   Osteoarthritis    Personal history of radiation therapy 2014   PONV (postoperative nausea and vomiting)    Radiation 07/03/12-07/09/12   Right upper lung 5400 cGy   Thyroid nodule, toxic or with hyperthyroidism    no current med.   Urinary, incontinence, stress female    Vasomotor rhinitis    Vertigo     Past Surgical History:  Procedure Laterality Date   ANTERIOR CERVICAL DECOMP/DISCECTOMY FUSION N/A 12/22/2019   Procedure: Cervical four-five Cervical five-six Cervical six-seven Anterior cervical decompression/discectomy/fusion;  Surgeon: Maeola Harman, MD;  Location: [facility] OR;  Service: Neurosurgery;  Laterality: N/A;   APPENDECTOMY  1971   BREAST LUMPECTOMY Right 2014   BREAST RECONSTRUCTION WITH PLACEMENT OF TISSUE EXPANDER AND FLEX HD (ACELLULAR HYDRATED DERMIS) Right 05/20/2013   Procedure: IMMEDIATE RIGHT BREAST RECONSTRUCTION WITH LATISSAMUS MUSCLE FLAP AND PLACEMENT OF TISSUE EXPANDER TO RIGHT BREAST;  Surgeon: Wayland Denis, DO;  Location: [facility] OR;  Service: Plastics;  Laterality: Right;   BREAST REDUCTION WITH MASTOPEXY Left 10/22/2013   Procedure: BREAST REDUCTION/MASTOPEXY TO LEFT BREAST WITH LIPOSUCTION/FAT GRAFTING FOR SYMMETRY;  Surgeon: Wayland Denis, DO;  Location: [facility];  Service: Plastics;  Laterality: Left;   CYSTOSCOPY N/A 04/11/2021   Procedure: CYSTOSCOPY;  Surgeon: Carver Fila, MD;  Location: [facility] ORS;  Service: Gynecology;  Laterality: N/A;   DE QUERVAIN'S RELEASE Left 07/31/2002   FRACTURE SURGERY Left 2019   Torn meniscus; bone fragments removed   ganglion cyst R hand     1980s   IR IMAGING  GUIDED PORT INSERTION  05/04/2021   IR REMOVAL TUN ACCESS W/ PORT W/O FL MOD SED  11/29/2021   IR US GUIDE VASC ACCESS RIGHT  05/04/2021   KNEE SURGERY Left    torn meniscus   LUNG REMOVAL, PARTIAL     2014   MASTECTOMY Right    2015   MASTECTOMY W/ SENTINEL NODE BIOPSY Right 05/20/2013   Procedure: RIGHT SIMPLEMASTECTOMY AND SENTINEL NODE   Sampling;  Surgeon: Maisie Fus A. Cornett, MD;  Location: [facility] OR;  Service: General;  Laterality: Right;   MOUTH SURGERY Right may 2014   "keratocystic odotogenic tumor" - X 2   NASAL SINUS SURGERY  ?1988-1990   "at least 3" (05/21/2013)   PARTIAL MASTECTOMY WITH NEEDLE LOCALIZATION  04/10/2012   Procedure: PARTIAL MASTECTOMY WITH NEEDLE LOCALIZATION;  Surgeon: Maisie Fus A. Cornett, MD;  Location: [facility];  Service: General;  Laterality: Right;   REDUCTION MAMMAPLASTY Left 2015   REMOVAL OF TISSUE EXPANDER AND PLACEMENT OF IMPLANT Right 10/22/2013   Procedure: REMOVAL OF RIGHT TISSUE EXPANDER WITH PLACEMENT OF RIGHT BREAST IMPLANT;  Surgeon: Wayland Denis, DO;  Location: [facility];  Service: Plastics;  Laterality: Right;   ROBOTIC ASSISTED BILATERAL SALPINGO OOPHERECTOMY Bilateral 04/11/2021   Procedure: XI ROBOTIC ASSISTED HYSTERECTOMY WITH BILATERAL SALPINGO OOPHORECTOMY; OMENTAL BIOPSY;  Surgeon: Pricilla Holm,  Carmelina Peal, MD;  Location: [facility];  Service: Gynecology;  Laterality: Bilateral;   SENTINEL NODE BIOPSY N/A 04/11/2021   Procedure: SENTINEL NODE BIOPSY;  Surgeon: Carver Fila, MD;  Location: [facility];  Service: Gynecology;  Laterality: N/A;   SEPTOPLASTY     & sinus surgeries 1980s-1990s   THORACOTOMY Left 07/14/2004   TRIGGER FINGER RELEASE Bilateral    "I've had OR on q finger" (05/21/2013)   TRIGGER FINGER RELEASE Right 07/14/1999   index and little fingers   TRIGGER FINGER RELEASE Right 07/17/2002   long and ring fingers   TRIGGER FINGER RELEASE Left 07/31/2002   little, ring and index fingers   VIDEO ASSISTED  THORACOSCOPY (VATS)/ LOBECTOMY Left 07/14/2004   upper tri-segmentectomy   WISDOM TOOTH EXTRACTION     "3"    Family History  Problem Relation Age of Onset   Anesthesia problems Mother        post-op N/V   Thyroid nodules Mother    Other Mother        DDD, spinal stenosis   High blood pressure Mother    Hearing loss Mother    Leukemia Father        CML, d. 86s   High blood pressure Father    Hearing loss Father    Breast cancer Sister        BRCA testing in 2006 timeframe is neg   Thyroid nodules Sister    Thyroid nodules Sister    Hearing loss Brother    Breast cancer Maternal Aunt        dx >50   Thyroid nodules Maternal Aunt    Thyroid nodules Maternal Aunt    Hearing loss Paternal Aunt    Hearing loss Paternal Uncle    Stomach cancer Maternal Grandmother    Anesthesia problems Daughter        post-op N/V   Stomach cancer Maternal Great-grandmother        assumed stomach cancer   Prostate cancer Neg Hx    Pancreatic cancer Neg Hx    Endometrial cancer Neg Hx    Ovarian cancer Neg Hx    Colon cancer Neg Hx     Social History   Socioeconomic History   Marital status: Married    Spouse name: Not on file   Number of children: 1   Years of education: Not on file   Highest education level: Not on file  Occupational History    Employer: [employer]  Tobacco Use   Smoking status: Former    Current packs/day: 0.00    Types: Cigarettes    Quit date: 05/06/2004    Years since quitting: 18.6   Smokeless tobacco: Never  Vaping Use   Vaping status: Never Used  Substance and Sexual Activity   Alcohol use: Yes    Alcohol/week: 7.0 standard drinks of alcohol    Types: 7 Glasses of wine per week    Comment: 1 glass wine daily   Drug use: Never   Sexual activity: Not Currently  Other Topics Concern   Not on file  Social History Narrative   Lives at home with spouse   Right handed   Caffeine: 2-3 cups of coffee/day   Social Determinants of Health    Financial Resource Strain: Not on file  Food Insecurity: Not on file  Transportation Needs: Not on file  Physical Activity: Not on file  Stress: Not on file  Social Connections: Unknown (08/07/2021)   [facility]   Social Network    Social Network: Not on file    Current Medications:  Current Outpatient Medications:    acetaminophen (TYLENOL) 500 MG tablet, Take 1,000 mg by mouth at bedtime as needed (Arthritis pain)., Disp: , Rfl:    Calcium Carb-Cholecalciferol 500-10 MG-MCG TABS, Take 1 tablet by mouth 2 (two) times daily., Disp: , Rfl:    calcium carbonate (TUMS) 500 MG chewable tablet, Chew 1 tablet by mouth at bedtime., Disp: , Rfl:    Cholecalciferol (VITAMIN D) 50 MCG (2000 UT) CAPS, Take 1 capsule by mouth daily., Disp: , Rfl:    cycloSPORINE (RESTASIS) 0.05 % ophthalmic emulsion, Place 1 drop into both eyes 2 (two) times daily. , Disp: , Rfl:    esomeprazole (NEXIUM) 20 MG capsule, Take 20 mg by mouth daily., Disp: , Rfl:    fluocinonide gel (LIDEX) 0.05 %, Apply 1 application topically daily as needed (canker sores). , Disp: , Rfl:    fluticasone (FLONASE) 50 MCG/ACT nasal spray, Place 2 sprays into both nostrils daily. , Disp: , Rfl:    Lactobacillus (ACIDOPHILUS PO), Take 5 mg by mouth daily., Disp: , Rfl:    Magnesium 250 MG TABS, Take by mouth daily., Disp: , Rfl:    Olopatadine HCl 0.2 % SOLN, Place 1 drop into both eyes daily as needed (Allergies/Seasonal)., Disp: , Rfl:    Polyethyl Glycol-Propyl Glycol (SYSTANE) 0.4-0.3 % SOLN, Place 1 drop into both eyes daily as needed (Dry eye)., Disp: , Rfl:    vitamin E 400 UNIT capsule, Take 400 Units by mouth daily. , Disp: , Rfl:   Review of Systems: Denies appetite changes, fevers, chills, fatigue, unexplained weight changes. Denies hearing loss, neck lumps or masses, mouth sores, ringing in ears or voice changes. Denies cough or wheezing.  Denies shortness of breath. Denies chest pain or  palpitations. Denies leg swelling. Denies abdominal distention, pain, blood in stools, constipation, diarrhea, nausea, vomiting, or early satiety. Denies pain with intercourse, dysuria, frequency, hematuria or incontinence. Denies hot flashes, pelvic pain, vaginal bleeding or vaginal discharge.   Denies joint pain, back pain or muscle pain/cramps. Denies itching, rash, or wounds. Denies dizziness, headaches, numbness or seizures. Denies swollen lymph nodes or glands, denies easy bruising or bleeding. Denies anxiety, depression, confusion, or decreased concentration.  Physical Exam: BP 121/64 (BP Location: Left Arm)   Pulse 74   Temp 98.4 F (36.9 C) (Oral)   Ht 5\' 4"  (1.626 m)   Wt 158 lb 12.8 oz (72 kg)   SpO2 100%   BMI 27.26 kg/m  General: Alert, oriented, no acute distress.  HEENT: Normocephalic, atraumatic. Sclera anicteric.  Chest: Clear to auscultation bilaterally. No wheezes, rhonchi, or rales. Cardiovascular: Regular rate and rhythm, no murmurs, rubs, or gallops.  Abdomen: Normoactive bowel sounds. Soft, nondistended, nontender to palpation. No masses or hepatosplenomegaly appreciated.  Well-healed incisions. Extremities: Grossly normal range of motion. Warm, well perfused.   Skin: No rashes or lesions.  GU: Normal external female genitalia.  Speculum exam shows mildly atrophic vaginal mucosa, some radiation changes noted at the vaginal apex.  Some laxity of the apical vagina. No lesions, bleeding, or discharge. On bimanual exam, cuff intact, no nodularity or masses.  Exam findings confirmed on rectovaginal exam.  Laboratory & Radiologic Studies: None new  Assessment & Plan: PAM VANALSTINE is a 72 y.o. woman with Stage IA serous uterine cancer who presents for surveillance. Completed adjuvant chemotherapy and vaginal brachytherapy in  07/2021. HER2 negative. MMR intact.   Patient is doing well.  She is NED on exam today.   Discussed repeating CT scan 1 year after her  last.  This was scheduled today.   Per NCCN surveillance recommendations, in the setting of high risk endometrial cancer, we will plan on visits every 3 months for the first 2-3 years.  These will alternate between my office and radiation oncology. I will see her back in 6 months.   We reviewed signs and symptoms that would be concerning for cancer recurrence and I stressed the importance of calling if she develops any of these between visits.   20 minutes of total time was spent for this patient encounter, including preparation, face-to-face counseling with the patient and coordination of care, and documentation of the encounter.  Eugene Garnet, MD  Division of Gynecologic Oncology  Department of Obstetrics and Gynecology  [facility]

## 2023-01-03 NOTE — Patient Instructions (Signed)
It was good to see you today.  I do not see or feel any evidence of cancer recurrence on your exam.  I will see you for follow-up in 6 months.  We will plan to repeat a CT scan 1 year after your last 1.  I will update you when I get these results.  As always, if you develop any new and concerning symptoms before your next visit, please call to see me sooner.

## 2023-01-09 ENCOUNTER — Ambulatory Visit
Admission: RE | Admit: 2023-01-09 | Discharge: 2023-01-09 | Disposition: A | Discharge status: Home / Self Care | Payer: Medicare Other | Source: Ambulatory Visit | Attending: Family Medicine | Admitting: Family Medicine

## 2023-01-09 DIAGNOSIS — Z1231 Encounter for screening mammogram for malignant neoplasm of breast: Secondary | ICD-10-CM

## 2023-03-04 ENCOUNTER — Ambulatory Visit: Payer: Self-pay | Admitting: Radiation Oncology

## 2023-03-05 ENCOUNTER — Ambulatory Visit: Payer: Medicare Other | Admitting: Internal Medicine

## 2023-03-05 ENCOUNTER — Other Ambulatory Visit: Payer: Medicare Other

## 2023-03-05 ENCOUNTER — Inpatient Hospital Stay: Payer: Medicare Other | Attending: Gynecologic Oncology

## 2023-03-05 ENCOUNTER — Inpatient Hospital Stay: Payer: Medicare Other | Admitting: Internal Medicine

## 2023-03-05 VITALS — BP 129/76 | HR 74 | Temp 97.9°F | Resp 18 | Wt 160.5 lb

## 2023-03-05 DIAGNOSIS — Z8542 Personal history of malignant neoplasm of other parts of uterus: Secondary | ICD-10-CM | POA: Insufficient documentation

## 2023-03-05 DIAGNOSIS — Z79899 Other long term (current) drug therapy: Secondary | ICD-10-CM | POA: Insufficient documentation

## 2023-03-05 DIAGNOSIS — C541 Malignant neoplasm of endometrium: Secondary | ICD-10-CM

## 2023-03-05 DIAGNOSIS — Z9049 Acquired absence of other specified parts of digestive tract: Secondary | ICD-10-CM | POA: Diagnosis not present

## 2023-03-05 DIAGNOSIS — Z923 Personal history of irradiation: Secondary | ICD-10-CM | POA: Insufficient documentation

## 2023-03-05 DIAGNOSIS — M1990 Unspecified osteoarthritis, unspecified site: Secondary | ICD-10-CM | POA: Insufficient documentation

## 2023-03-05 DIAGNOSIS — Z85118 Personal history of other malignant neoplasm of bronchus and lung: Secondary | ICD-10-CM | POA: Diagnosis present

## 2023-03-05 DIAGNOSIS — Z9221 Personal history of antineoplastic chemotherapy: Secondary | ICD-10-CM | POA: Insufficient documentation

## 2023-03-05 DIAGNOSIS — Z853 Personal history of malignant neoplasm of breast: Secondary | ICD-10-CM | POA: Insufficient documentation

## 2023-03-05 DIAGNOSIS — Z800 Family history of malignant neoplasm of digestive organs: Secondary | ICD-10-CM | POA: Diagnosis not present

## 2023-03-05 DIAGNOSIS — K219 Gastro-esophageal reflux disease without esophagitis: Secondary | ICD-10-CM | POA: Diagnosis not present

## 2023-03-05 LAB — CBC WITH DIFFERENTIAL (CANCER CENTER ONLY)
Abs Immature Granulocytes: 0.01 10*3/uL (ref 0.00–0.07)
Basophils Absolute: 0 10*3/uL (ref 0.0–0.1)
Basophils Relative: 1 %
Eosinophils Absolute: 0.3 10*3/uL (ref 0.0–0.5)
Eosinophils Relative: 6 %
HCT: 40.3 % (ref 36.0–46.0)
Hemoglobin: 13.1 g/dL (ref 12.0–15.0)
Immature Granulocytes: 0 %
Lymphocytes Relative: 21 %
Lymphs Abs: 0.9 10*3/uL (ref 0.7–4.0)
MCH: 30.5 pg (ref 26.0–34.0)
MCHC: 32.5 g/dL (ref 30.0–36.0)
MCV: 93.9 fL (ref 80.0–100.0)
Monocytes Absolute: 0.5 10*3/uL (ref 0.1–1.0)
Monocytes Relative: 11 %
Neutro Abs: 2.5 10*3/uL (ref 1.7–7.7)
Neutrophils Relative %: 61 %
Platelet Count: 259 10*3/uL (ref 150–400)
RBC: 4.29 MIL/uL (ref 3.87–5.11)
RDW: 13.7 % (ref 11.5–15.5)
WBC Count: 4.1 10*3/uL (ref 4.0–10.5)
nRBC: 0 % (ref 0.0–0.2)

## 2023-03-05 LAB — CMP (CANCER CENTER ONLY)
ALT: 10 U/L (ref 0–44)
AST: 12 U/L — ABNORMAL LOW (ref 15–41)
Albumin: 3.7 g/dL (ref 3.5–5.0)
Alkaline Phosphatase: 88 U/L (ref 38–126)
Anion gap: 8 (ref 5–15)
BUN: 17 mg/dL (ref 8–23)
CO2: 27 mmol/L (ref 22–32)
Calcium: 9.1 mg/dL (ref 8.9–10.3)
Chloride: 106 mmol/L (ref 98–111)
Creatinine: 0.75 mg/dL (ref 0.44–1.00)
GFR, Estimated: >60 mL/min (ref >60)
Glucose, Bld: 95 mg/dL (ref 70–99)
Potassium: 4.2 mmol/L (ref 3.5–5.1)
Sodium: 141 mmol/L (ref 135–145)
Total Bilirubin: 0.5 mg/dL (ref <1.2)
Total Protein: 6.5 g/dL (ref 6.5–8.1)

## 2023-03-05 NOTE — Progress Notes (Signed)
[facility] [phone]) [phone]   [phone]  OFFICE PROGRESS NOTE  Noberto Retort, MD [address] [place] [address]  DIAGNOSIS:  1) stage IIA non-small cell lung cancer diagnosed in March of 2006 2) recurrent ductal carcinoma in situ of the right breast initially diagnosed in January of 2014 with recurrence in October of 2014. 3) endometrial carcinoma currently managed by Dr. Bertis Ruddy PRIOR THERAPY: 1) status post left trisegmentectomy with node dissection on 06/24/2004 2) status post 4 cycles of adjuvant chemotherapy with carboplatin and paclitaxel last dose was given 10/24/2004. 3) status post right partial mastectomy on 04/10/2012 under the care of Dr. [name]. 4) status post adjuvant radiotherapy to the right breast under the care of Dr. Dayton Scrape 5) Tamoxifen 20 mg by mouth daily started in May of 2014. 6) status post laparoscopic total hysterectomy with bilateral salpingo oophorectomy and sentinel lymph node biopsy as well as omental biopsy and cystoscopy under the care of Dr. Pricilla Holm. 7) status post systemic chemotherapy with carboplatin and paclitaxel under the care of Dr. Bertis Ruddy for 6 cycles last dose was giving 08/22/2021.  CURRENT THERAPY: Observation.  INTERVAL HISTORY: [name] 72 y.o. female returns to the clinic today for annual follow-up visit.Discussed the use of AI scribe software for clinical note transcription with the patient, who gave verbal consent to proceed.  History of Present Illness   The patient, a 72 year old with a complex oncological history, reports no new complaints or symptoms since the last visit. She was initially diagnosed with stage 2A non-small cell lung cancer in March 2006, for which she underwent surgical resection of the left upper lobe and received four cycles of chemotherapy.  In January and October 2014, she experienced recurrent ductal carcinoma in situ. Most recently, in early  2024, she was diagnosed with endometrial carcinoma. She underwent surgery, chemotherapy, and radiation for this condition.  The patient reports that all recent check-ups have been satisfactory, with no new complaints or symptoms. She has been released from care by her gynecological surgical oncologist and is due for a scan in March. She has also discontinued tamoxifen following the discovery of an inhibitor.  The patient's recent mammogram results were good, and her blood count is reported as perfect. She is actively trying to exercise and maintain a healthy lifestyle. Despite her complex medical history, the patient appears to be in good health and spirits.        MEDICAL HISTORY: Past Medical History:  Diagnosis Date   Anxiety    Breast cancer (HCC)    2014 and 2015   Cancer (HCC)    lung cancer    DDD (degenerative disc disease)    neck   Dry eye    Eczema    Family history of breast cancer    Family history of stomach cancer    GERD (gastroesophageal reflux disease)    occasional - no current med.   Goiter    History of breast cancer    History of lung cancer    non-small cell - left upper lobe   History of radiation therapy    Vagina- 045/04/23-05/23/23- Dr. Antony Blackbird   IBS (irritable bowel syndrome)    Non-allergic rhinitis    sinusitis   Osteoarthritis    Personal history of radiation therapy 2014   PONV (postoperative nausea and vomiting)    Radiation 07/03/12-07/09/12   Right upper lung 5400 cGy   Thyroid nodule, toxic  or with hyperthyroidism    no current med.   Urinary, incontinence, stress female    Vasomotor rhinitis    Vertigo     ALLERGIES:  is allergic to augmentin [amoxicillin-pot clavulanate], morphine and codeine, amoxicillin, asa [aspirin], banana, ceftin [cefuroxime axetil], citrus, lactose intolerance (gi), orange oil, penicillins, cefuroxime, clavulanic acid, tetanus-diphth-acell pertussis, and tape.  MEDICATIONS:  Current Outpatient  Medications  Medication Sig Dispense Refill   acetaminophen (TYLENOL) 500 MG tablet Take 1,000 mg by mouth at bedtime as needed (Arthritis pain).     Calcium Carb-Cholecalciferol 500-10 MG-MCG TABS Take 1 tablet by mouth 2 (two) times daily.     calcium carbonate (TUMS) 500 MG chewable tablet Chew 1 tablet by mouth at bedtime.     Cholecalciferol (VITAMIN D) 50 MCG (2000 UT) CAPS Take 1 capsule by mouth daily.     cycloSPORINE (RESTASIS) 0.05 % ophthalmic emulsion Place 1 drop into both eyes 2 (two) times daily.      esomeprazole (NEXIUM) 20 MG capsule Take 20 mg by mouth daily.     fluocinonide gel (LIDEX) 0.05 % Apply 1 application topically daily as needed (canker sores).      fluticasone (FLONASE) 50 MCG/ACT nasal spray Place 2 sprays into both nostrils daily.      Lactobacillus (ACIDOPHILUS PO) Take 5 mg by mouth daily.     Magnesium 250 MG TABS Take by mouth daily.     melatonin 5 MG TABS Take 5 mg by mouth.     Olopatadine HCl 0.2 % SOLN Place 1 drop into both eyes daily as needed (Allergies/Seasonal).     Polyethyl Glycol-Propyl Glycol (SYSTANE) 0.4-0.3 % SOLN Place 1 drop into both eyes daily as needed (Dry eye).     vitamin E 400 UNIT capsule Take 400 Units by mouth daily.      No current facility-administered medications for this visit.    SURGICAL HISTORY:  Past Surgical History:  Procedure Laterality Date   ANTERIOR CERVICAL DECOMP/DISCECTOMY FUSION N/A 12/22/2019   Procedure: Cervical four-five Cervical five-six Cervical six-seven Anterior cervical decompression/discectomy/fusion;  Surgeon: Maeola Harman, MD;  Location: [facility] OR;  Service: Neurosurgery;  Laterality: N/A;   APPENDECTOMY  1971   BREAST LUMPECTOMY Right 2014   BREAST RECONSTRUCTION WITH PLACEMENT OF TISSUE EXPANDER AND FLEX HD (ACELLULAR HYDRATED DERMIS) Right 05/20/2013   Procedure: IMMEDIATE RIGHT BREAST RECONSTRUCTION WITH LATISSAMUS MUSCLE FLAP AND PLACEMENT OF TISSUE EXPANDER TO RIGHT BREAST;  Surgeon: Wayland Denis, DO;  Location: [facility] OR;  Service: Plastics;  Laterality: Right;   BREAST REDUCTION WITH MASTOPEXY Left 10/22/2013   Procedure: BREAST REDUCTION/MASTOPEXY TO LEFT BREAST WITH LIPOSUCTION/FAT GRAFTING FOR SYMMETRY;  Surgeon: Wayland Denis, DO;  Location: [facility];  Service: Plastics;  Laterality: Left;   CYSTOSCOPY N/A 04/11/2021   Procedure: CYSTOSCOPY;  Surgeon: Carver Fila, MD;  Location: [facility] ORS;  Service: Gynecology;  Laterality: N/A;   DE QUERVAIN'S RELEASE Left 07/31/2002   FRACTURE SURGERY Left 2019   Torn meniscus; bone fragments removed   ganglion cyst R hand     1980s   IR IMAGING GUIDED PORT INSERTION  05/04/2021   IR REMOVAL TUN ACCESS W/ PORT W/O FL MOD SED  11/29/2021   IR US GUIDE VASC ACCESS RIGHT  05/04/2021   KNEE SURGERY Left    torn meniscus   LUNG REMOVAL, PARTIAL     2014   MASTECTOMY Right    2015   MASTECTOMY W/ SENTINEL NODE BIOPSY Right 05/20/2013  Procedure: RIGHT SIMPLEMASTECTOMY AND SENTINEL NODE   Sampling;  Surgeon: Clovis Pu. Cornett, MD;  Location: [facility] OR;  Service: General;  Laterality: Right;   MOUTH SURGERY Right may 2014   "keratocystic odotogenic tumor" - X 2   NASAL SINUS SURGERY  ?1988-1990   "at least 3" (05/21/2013)   PARTIAL MASTECTOMY WITH NEEDLE LOCALIZATION  04/10/2012   Procedure: PARTIAL MASTECTOMY WITH NEEDLE LOCALIZATION;  Surgeon: Maisie Fus A. Cornett, MD;  Location: [facility];  Service: General;  Laterality: Right;   REDUCTION MAMMAPLASTY Left 2015   REMOVAL OF TISSUE EXPANDER AND PLACEMENT OF IMPLANT Right 10/22/2013   Procedure: REMOVAL OF RIGHT TISSUE EXPANDER WITH PLACEMENT OF RIGHT BREAST IMPLANT;  Surgeon: Wayland Denis, DO;  Location: [facility];  Service: Plastics;  Laterality: Right;   ROBOTIC ASSISTED BILATERAL SALPINGO OOPHERECTOMY Bilateral 04/11/2021   Procedure: XI ROBOTIC ASSISTED HYSTERECTOMY WITH BILATERAL SALPINGO OOPHORECTOMY; OMENTAL BIOPSY;  Surgeon: Carver Fila,  MD;  Location: [facility];  Service: Gynecology;  Laterality: Bilateral;   SENTINEL NODE BIOPSY N/A 04/11/2021   Procedure: SENTINEL NODE BIOPSY;  Surgeon: Carver Fila, MD;  Location: [facility];  Service: Gynecology;  Laterality: N/A;   SEPTOPLASTY     & sinus surgeries 1980s-1990s   THORACOTOMY Left 07/14/2004   TRIGGER FINGER RELEASE Bilateral    "I've had OR on q finger" (05/21/2013)   TRIGGER FINGER RELEASE Right 07/14/1999   index and little fingers   TRIGGER FINGER RELEASE Right 07/17/2002   long and ring fingers   TRIGGER FINGER RELEASE Left 07/31/2002   little, ring and index fingers   VIDEO ASSISTED THORACOSCOPY (VATS)/ LOBECTOMY Left 07/14/2004   upper tri-segmentectomy   WISDOM TOOTH EXTRACTION     "3"    REVIEW OF SYSTEMS:  A comprehensive review of systems was negative.   PHYSICAL EXAMINATION: General appearance: alert, cooperative, and no distress Head: Normocephalic, without obvious abnormality, atraumatic Neck: no adenopathy Lymph nodes: Cervical, supraclavicular, and axillary nodes normal. Resp: clear to auscultation bilaterally Back: symmetric, no curvature. ROM normal. No CVA tenderness. Cardio: regular rate and rhythm, S1, S2 normal, no murmur, click, rub or gallop GI: soft, non-tender; bowel sounds normal; no masses,  no organomegaly Extremities: extremities normal, atraumatic, no cyanosis or edema  ECOG PERFORMANCE STATUS: 1 - Symptomatic but completely ambulatory  Blood pressure 129/76, pulse 74, temperature 97.9 F (36.6 C), temperature source Temporal, resp. rate 18, weight 160 lb 8 oz (72.8 kg), SpO2 100%.  LABORATORY DATA: Lab Results  Component Value Date   WBC 4.1 03/05/2023   HGB 13.1 03/05/2023   HCT 40.3 03/05/2023   MCV 93.9 03/05/2023   PLT 259 03/05/2023      Chemistry      Component Value Date/Time   NA 140 03/06/2022 1005   NA 145 (H) 05/06/2019 1021   NA 141 10/29/2016 0742   K 4.3 03/06/2022 1005   K 4.1 10/29/2016 0742   CL 104  03/06/2022 1005   CL 108 (H) 04/28/2012 1008   CO2 31 03/06/2022 1005   CO2 26 10/29/2016 0742   BUN 13 03/06/2022 1005   BUN 15 05/06/2019 1021   BUN 21.1 10/29/2016 0742   CREATININE 0.73 03/06/2022 1005   CREATININE 0.9 10/29/2016 0742      Component Value Date/Time   CALCIUM 9.9 03/06/2022 1005   CALCIUM 9.0 10/29/2016 0742   ALKPHOS 84 03/06/2022 1005   ALKPHOS 49 10/29/2016 0742   AST 16 03/06/2022 1005  AST 15 10/29/2016 0742   ALT 13 03/06/2022 1005   ALT 12 10/29/2016 0742   BILITOT 0.4 03/06/2022 1005   BILITOT 0.32 10/29/2016 0742       RADIOGRAPHIC STUDIES: No results found.  ASSESSMENT AND PLAN:  This is a very pleasant 72 years old white female with stage IIA non-small cell lung cancer as well as recurrent breast ductal carcinoma in situ. The patient is status post right lumpectomy followed by adjuvant radiotherapy.  She was also treated with adjuvant tamoxifen for more than 9 years. The patient was diagnosed in January 2023 with endometrial carcinoma status post total abdominal hysterectomy with bilateral salpingo-oophorectomy under the care of Dr. Pricilla Holm followed by systemic chemotherapy with carboplatin and paclitaxel under the care of Dr. Bertis Ruddy.    Non-Small Cell Lung Cancer (NSCLC) Stage 2A NSCLC diagnosed in March 2006. Underwent left upper lobe resection and four cycles of chemotherapy. No current symptoms or recurrence.  Recurrent Ductal Carcinoma In Situ (DCIS) Recurrent DCIS with episodes in January and October 2014. No current symptoms or recurrence.  Endometrial Carcinoma Diagnosed in early 2024. Underwent surgery, chemotherapy, and radiation therapy. No current symptoms or recurrence. Follow-up with Dr. Pricilla Holm (gynecological surgical oncology) and Dr. Roselind Messier (radiation oncology) planned. - Follow-up with Dr. Roselind Messier in January - Order scan in March - Follow-up with Dr. Pricilla Holm after scan  General Health Maintenance No new complaints. Blood  work and mammogram in October were normal. Engaging in regular exercise and maintaining a healthy lifestyle. - Continue regular exercise - Maintain healthy lifestyle  Follow-up - Schedule follow-up appointment in one year.   The patient was advised to call immediately if she has any other concerning symptoms in the interval. I recommended for her to continue on observation with routine follow-up visit under the care of Dr. Pricilla Holm in Dr. Roselind Messier.   All questions were answered. The patient knows to call the clinic with any problems, questions or concerns. We can certainly see the patient much sooner if necessary.   Disclaimer: This note was dictated with voice recognition software. Similar sounding words can inadvertently be transcribed and may be missed upon review.

## 2023-03-07 ENCOUNTER — Ambulatory Visit: Payer: Medicare Other | Admitting: Radiation Oncology

## 2023-04-07 NOTE — Progress Notes (Signed)
 Radiation Oncology         [phone] ________________________________  Name: [name] MRN: [id number]  Date: 04/08/2023  DOB: April 27, 1950  Follow-Up Visit Note  CC: Arloa Elsie SAUNDERS, MD  Arloa Elsie SAUNDERS, MD    ICD-10-CM   1. Endometrial carcinoma (HCC)  C54.1       Diagnosis: The encounter diagnosis was Endometrial carcinoma (HCC).   FIGO Stage IA (pT1a, pN0, cM0) Endometrial Cancer, high-grade serous  Interval Since Last Radiation: 1 year, 7 months, and 19 days   2) Intent: Curative Radiation Treatment Dates: 07/27/2021 through 08/17/2021 Site Technique Total Dose (Gy) Dose per Fx (Gy) Completed Fx Beam Energies  Vagina: Pelvis HDR-brachy 30/30 6 5/5 Ir-192    1) DIAGNOSIS: Stage 0 (Tis N0 M0) multifocal DCIS of the right breast INDICATION FOR TREATMENT: Curative TREATMENT DATES: 05/22/2012 through 07/04/2012                                                     SITE/DOSE:  Right breast 5000 cGy 25 sessions right breast boost 1000 cGy 5 sessions                        BEAMS/ENERGY:  Tangential fields to the right breast, 6 MV photons. Electron beam boost   15 MEV electrons  Narrative:  The patient returns today for routine follow-up. She was last seen here for follow-up on 08/30/22. Since her last visit, she most recently followed up with Dr. Viktoria on 01/03/23. During which time, she denied any symptoms concerning for disease recurrence, and she was noted as NED on examination from a gynecologic disease standpoint.    She also recently followed up with Dr. Sherrod on 03/05/23. From a lung and breast cancer history standpoint, she was also noted to be doing well and denied any symptoms concerning for disease recurrence. She is due to a restaging CT CAP this coming March for continued surveillance.   Pertinent imaging performed in the interval since her last visit includes a bilateral screening mammogram on 01/09/23 which showed no evidence of malignancy in either  breast.    Of note: she presented to the ED (at [facility] in [place]) on 10/15/22 after a falling and hitting the bridge of her nose. She also endorsed right elbow and right ankle pain resulting from the fall. Imaging performed in the ED included a CT of the face, head, and c-spine which showed evidence of bilateral nasal bone fractures. Imaging findings were otherwise unremarkable and showed no evidence of intracranial bleeding. X-rays of her right forearm and elbow were also taken which showed a right radial head acute depressed fracture, as well as an acute nondisplaced fracture of the ulna.                     Patient reports to be doing well overall today. She denies any abdominal pain or abnormal bloating, vaginal discharge, hematuria or hematochezia. She is using her dilator once per week.         Allergies:  is allergic to augmentin [amoxicillin-pot clavulanate], morphine  and codeine, amoxicillin, asa [aspirin], banana, ceftin [cefuroxime axetil], citrus, lactose intolerance (gi), orange oil, penicillins, cefuroxime, clavulanic acid, tetanus-diphth-acell pertussis, and tape.  Meds: Current Outpatient Medications  Medication Sig Dispense Refill   acetaminophen  (  TYLENOL ) 500 MG tablet Take 1,000 mg by mouth at bedtime as needed (Arthritis pain).     Calcium Carb-Cholecalciferol 500-10 MG-MCG TABS Take 1 tablet by mouth 2 (two) times daily.     calcium carbonate (TUMS) 500 MG chewable tablet Chew 1 tablet by mouth at bedtime.     Cholecalciferol (VITAMIN D) 50 MCG (2000 UT) CAPS Take 1 capsule by mouth daily.     cycloSPORINE  (RESTASIS ) 0.05 % ophthalmic emulsion Place 1 drop into both eyes 2 (two) times daily.      esomeprazole (NEXIUM) 20 MG capsule Take 20 mg by mouth daily.     fluocinonide  gel (LIDEX ) 0.05 % Apply 1 application topically daily as needed (canker sores).      fluticasone  (FLONASE ) 50 MCG/ACT nasal spray Place 2 sprays into both nostrils daily.      Lactobacillus  (ACIDOPHILUS PO) Take 5 mg by mouth daily.     Magnesium 250 MG TABS Take by mouth daily.     melatonin 5 MG TABS Take 5 mg by mouth.     Olopatadine  HCl 0.2 % SOLN Place 1 drop into both eyes daily as needed (Allergies/Seasonal).     Polyethyl Glycol-Propyl Glycol (SYSTANE) 0.4-0.3 % SOLN Place 1 drop into both eyes daily as needed (Dry eye).     vitamin E  400 UNIT capsule Take 400 Units by mouth daily.      No current facility-administered medications for this encounter.    Physical Findings: The patient is in no acute distress. Patient is alert and oriented.  height is 5' 4 (1.626 m) and weight is 159 lb 4 oz (72.2 kg). Her temporal temperature is 96.9 F (36.1 C) (abnormal). Her blood pressure is 125/79 and her pulse is 80. Her respiration is 18 and oxygen saturation is 100%. .  No significant changes. Lungs are clear to auscultation bilaterally. Heart has regular rate and rhythm. No palpable cervical, supraclavicular, or axillary adenopathy. Abdomen soft, non-tender, normal bowel sounds.  On pelvic examination the external genitalia were unremarkable. A speculum exam was performed. There are no mucosal lesions noted in the vaginal vault.  Radiation changes noted to the region of the vaginal cuff.  Some laxity noted to the anterior vaginal wall.  On bimanual and rectovaginal examination there were no pelvic masses appreciated.    Vaginal cuff intact.  Rectal sphincter tone good.      Lab Findings: Lab Results  Component Value Date   WBC 4.1 03/05/2023   HGB 13.1 03/05/2023   HCT 40.3 03/05/2023   MCV 93.9 03/05/2023   PLT 259 03/05/2023    Radiographic Findings: No results found.  Impression: The encounter diagnosis was Endometrial carcinoma (HCC).   FIGO Stage IA (pT1a, pN0, cM0) Endometrial Cancer, high-grade serous  The patient is doing well overall today and exhibits no signs of disease recurrence on clinical exam today.    Plan:  Per NCCN guidelines, patient will have  a CT scan on March 10th and follow up with Dr. Viktoria in April. Follow-up with radiation oncology in 6 months. Patient was educated on signs/symptoms that may indicate disease recurrence and understands to notify us  if she experiences any of them.   Patient continues to follow with Dr. Sherrod for surveillance for her lung cancer. She is scheduled to see him next on 03/03/24.   30 minutes of total time was spent for this patient encounter, including preparation, face-to-face counseling with the patient and coordination of care, physical exam, and documentation of  the encounter. ____________________________________   Leeroy Due, PA-C   Lynwood CHARM Nasuti, PhD, MD   [facility]  Radiation Oncology Direct Dial: [phone]  Fax: [phone] [email or web address]   This document serves as a record of services personally performed by Lynwood Nasuti, MD and Leeroy Due, PA-C. It was created on his behalf by Dorthy Fuse, a trained medical scribe. The creation of this record is based on the scribe's personal observations and the provider's statements to them. This document has been checked and approved by the attending provider.

## 2023-04-08 ENCOUNTER — Encounter: Payer: Self-pay | Admitting: Radiation Oncology

## 2023-04-08 ENCOUNTER — Ambulatory Visit
Admission: RE | Admit: 2023-04-08 | Discharge: 2023-04-08 | Disposition: A | Discharge status: Home / Self Care | Payer: Medicare Other | Source: Ambulatory Visit | Attending: Radiation Oncology | Admitting: Radiation Oncology

## 2023-04-08 VITALS — BP 125/79 | HR 80 | Temp 96.9°F | Resp 18 | Ht 64.0 in | Wt 159.2 lb

## 2023-04-08 DIAGNOSIS — Z8542 Personal history of malignant neoplasm of other parts of uterus: Secondary | ICD-10-CM | POA: Insufficient documentation

## 2023-04-08 DIAGNOSIS — C541 Malignant neoplasm of endometrium: Secondary | ICD-10-CM

## 2023-04-08 DIAGNOSIS — Z923 Personal history of irradiation: Secondary | ICD-10-CM | POA: Insufficient documentation

## 2023-04-08 DIAGNOSIS — D0511 Intraductal carcinoma in situ of right breast: Secondary | ICD-10-CM | POA: Diagnosis not present

## 2023-04-08 DIAGNOSIS — Z79899 Other long term (current) drug therapy: Secondary | ICD-10-CM | POA: Diagnosis not present

## 2023-04-08 NOTE — Progress Notes (Addendum)
 [name] is here today for follow up post radiation to the pelvic.  They completed their radiation on: 08/17/21   Does the patient complain of any of the following:  Pain:No Abdominal bloating:  Yes due to IBS.  Diarrhea/Constipation: Constipation at times. Patient has external and internal hemorrhoids. Nausea/Vomiting: No Vaginal Discharge: No Blood in Urine or Stool: No Urinary Issues (dysuria/incomplete emptying/ incontinence/ increased frequency/urgency): No Does patient report using vaginal dilator 2-3 times a week and/or sexually active 2-3 weeks: Yes patient using dilator once per week.  Post radiation skin changes: No   Additional comments if applicable:    BP 125/79 (BP Location: Left Arm, Patient Position: Sitting)   Pulse 80   Temp (!) 96.9 F (36.1 C) (Temporal)   Resp 18   Ht 5' 4 (1.626 m)   Wt 159 lb 4 oz (72.2 kg)   SpO2 100%   BMI 27.34 kg/m

## 2023-06-03 ENCOUNTER — Ambulatory Visit (HOSPITAL_COMMUNITY)
Admission: RE | Admit: 2023-06-03 | Discharge: 2023-06-03 | Disposition: A | Discharge status: Home / Self Care | Payer: Medicare Other | Source: Ambulatory Visit | Attending: Gynecologic Oncology | Admitting: Gynecologic Oncology

## 2023-06-03 DIAGNOSIS — C541 Malignant neoplasm of endometrium: Secondary | ICD-10-CM | POA: Insufficient documentation

## 2023-06-03 MED ORDER — IOHEXOL 300 MG/ML  SOLN
100.0000 mL | Freq: Once | INTRAMUSCULAR | Status: AC | PRN
  Administered 2023-06-03: 100 mL via INTRAVENOUS

## 2023-06-03 MED ORDER — IOHEXOL 300 MG/ML  SOLN
30.0000 mL | Freq: Once | INTRAMUSCULAR | Status: AC | PRN
  Administered 2023-06-03: 30 mL via ORAL

## 2023-06-05 ENCOUNTER — Encounter: Payer: Self-pay | Admitting: Gynecologic Oncology

## 2023-06-28 ENCOUNTER — Encounter: Payer: Self-pay | Admitting: Gynecologic Oncology

## 2023-06-28 ENCOUNTER — Inpatient Hospital Stay: Payer: Medicare Other | Attending: Gynecologic Oncology | Admitting: Gynecologic Oncology

## 2023-06-28 VITALS — BP 132/84 | HR 92 | Temp 97.9°F | Resp 19 | Wt 158.0 lb

## 2023-06-28 DIAGNOSIS — Z9011 Acquired absence of right breast and nipple: Secondary | ICD-10-CM | POA: Insufficient documentation

## 2023-06-28 DIAGNOSIS — Z923 Personal history of irradiation: Secondary | ICD-10-CM | POA: Diagnosis not present

## 2023-06-28 DIAGNOSIS — Z9221 Personal history of antineoplastic chemotherapy: Secondary | ICD-10-CM | POA: Diagnosis not present

## 2023-06-28 DIAGNOSIS — Z85118 Personal history of other malignant neoplasm of bronchus and lung: Secondary | ICD-10-CM | POA: Diagnosis not present

## 2023-06-28 DIAGNOSIS — Z90722 Acquired absence of ovaries, bilateral: Secondary | ICD-10-CM | POA: Insufficient documentation

## 2023-06-28 DIAGNOSIS — Z8542 Personal history of malignant neoplasm of other parts of uterus: Secondary | ICD-10-CM | POA: Diagnosis not present

## 2023-06-28 DIAGNOSIS — Z853 Personal history of malignant neoplasm of breast: Secondary | ICD-10-CM | POA: Insufficient documentation

## 2023-06-28 DIAGNOSIS — Z9079 Acquired absence of other genital organ(s): Secondary | ICD-10-CM | POA: Diagnosis not present

## 2023-06-28 DIAGNOSIS — C541 Malignant neoplasm of endometrium: Secondary | ICD-10-CM

## 2023-06-28 DIAGNOSIS — Z90710 Acquired absence of both cervix and uterus: Secondary | ICD-10-CM | POA: Diagnosis not present

## 2023-06-28 NOTE — Patient Instructions (Signed)
 It was good to see you today.  I do not see or feel any evidence of cancer recurrence on your exam.  I will see you for follow-up in 6 months.  As always, if you develop any new and concerning symptoms before your next visit, please call to see me sooner.

## 2023-06-28 NOTE — Progress Notes (Signed)
 Gynecologic Oncology Return Clinic Visit  06/28/23  Reason for Visit: surveillance  Treatment History: Oncology History Overview Note  CA-125 on 04/06/21: 10.2 High grade serous, Her 2 neg Genetics from 04/30/18: VUS   MMR IHC intact   History of lung cancer (Resolved)  04/28/2012 Initial Diagnosis   Lung cancer, diagnosed in March of 2006 stage II a status post left trisegmentectomy with lymph node dissection followed by 4 cycles of adjuvant chemotherapy   05/06/2018 Genetic Testing   MEN1 c.875C>T and MSH3 c.1366G>A VUS identified on the multicancer gene panel.  The Multi-Gene Panel offered by [facility] includes sequencing and/or deletion duplication testing of the following 84 genes: AIP, ALK, APC, ATM, AXIN2,BAP1,  BARD1, BLM, BMPR1A, BRCA1, BRCA2, BRIP1, CASR, CDC73, CDH1, CDK4, CDKN1B, CDKN1C, CDKN2A (p14ARF), CDKN2A (p16INK4a), CEBPA, CHEK2, CTNNA1, DICER1, DIS3L2, EGFR (c.2369C>T, p.Thr790Met variant only), EPCAM (Deletion/duplication testing only), FH, FLCN, GATA2, GPC3, GREM1 (Promoter region deletion/duplication testing only), HOXB13 (c.251G>A, p.Gly84Glu), HRAS, KIT, MAX, MEN1, MET, MITF (c.952G>A, p.Glu318Lys variant only), MLH1, MSH2, MSH3, MSH6, MUTYH, NBN, NF1, NF2, NTHL1, PALB2, PDGFRA, PHOX2B, PMS2, POLD1, POLE, POT1, PRKAR1A, PTCH1, PTEN, RAD50, RAD51C, RAD51D, RB1, RECQL4, RET, RUNX1, SDHAF2, SDHA (sequence changes only), SDHB, SDHC, SDHD, SMAD4, SMARCA4, SMARCB1, SMARCE1, STK11, SUFU, TERC, TERT, TMEM127, TP53, TSC1, TSC2, VHL, WRN and WT1.  The report date is May 06, 2018.   History of ductal carcinoma in situ (DCIS) of breast  04/28/2012 Initial Diagnosis   Ductal carcinoma in situ of right breast diagnosed in December of 2013, status post right lumpectomy in January of 2014   05/06/2018 Genetic Testing   MEN1 c.875C>T and MSH3 c.1366G>A VUS identified on the multicancer gene panel.  The Multi-Gene Panel offered by [facility] includes sequencing and/or deletion duplication  testing of the following 84 genes: AIP, ALK, APC, ATM, AXIN2,BAP1,  BARD1, BLM, BMPR1A, BRCA1, BRCA2, BRIP1, CASR, CDC73, CDH1, CDK4, CDKN1B, CDKN1C, CDKN2A (p14ARF), CDKN2A (p16INK4a), CEBPA, CHEK2, CTNNA1, DICER1, DIS3L2, EGFR (c.2369C>T, p.Thr790Met variant only), EPCAM (Deletion/duplication testing only), FH, FLCN, GATA2, GPC3, GREM1 (Promoter region deletion/duplication testing only), HOXB13 (c.251G>A, p.Gly84Glu), HRAS, KIT, MAX, MEN1, MET, MITF (c.952G>A, p.Glu318Lys variant only), MLH1, MSH2, MSH3, MSH6, MUTYH, NBN, NF1, NF2, NTHL1, PALB2, PDGFRA, PHOX2B, PMS2, POLD1, POLE, POT1, PRKAR1A, PTCH1, PTEN, RAD50, RAD51C, RAD51D, RB1, RECQL4, RET, RUNX1, SDHAF2, SDHA (sequence changes only), SDHB, SDHC, SDHD, SMAD4, SMARCA4, SMARCB1, SMARCE1, STK11, SUFU, TERC, TERT, TMEM127, TP53, TSC1, TSC2, VHL, WRN and WT1.  The report date is May 06, 2018.   Endometrial carcinoma (HCC)  01/27/2021 Initial Diagnosis   Patient began having vaginal spotting on November 10th.  She had 4 days of spotting which she describes as seeing red when she wiped and having a little brown discharge on her liner.  Bleeding then stopped completely.  She saw her primary care provider and underwent an ultrasound.  Ultrasound showed thickened endometrium measuring 14 mm.  She then started bleeding again on December 14 and had a week of bleeding with a little bit more red blood this time.  She ultimately saw OB/GYN and had an endometrial biopsy.  This was first attempted however secondary to cervical stenosis, patient returned the following day after using intravaginal misoprostol.  Endometrial biopsy on 12/22 revealed FIGO grade 2/3 endometrial cancer.   02/27/2021 Imaging   US pelvis Thickened endometrium with possible 14 mm endometrial mass. In the setting of post-menopausal bleeding, endometrial sampling is indicated to exclude carcinoma   03/06/2021 Initial Biopsy   Endometrial biopsy revealed FIGO grade 2/3 endometrial cancer  03/24/2021 Initial Diagnosis   Endometrial carcinoma (HCC)   03/30/2021 Imaging   CT A/P: 1. Abnormal thickening of the endometrium, as seen on prior ultrasound and consistent with recently diagnosed endometrial malignancy. 2. No evidence of lymphadenopathy or metastatic disease in the abdomen or pelvis. 3. Subcapsular hyperenhancing lesion of the lateral inferior right lobe of the liver, hepatic segment VI is only very faintly appreciated on this portal venous phase contrast examination, better characterized as a benign focal nodular hyperplasia by prior MR. 4. Diverticulosis.   04/11/2021 Surgery   Robotic-assisted laparoscopic total hysterectomy with bilateral salpingoophorectomy, SLN biopsy, omental biopsy, cystoscopy   Findings: On EUA, small mobile uterus. On intra-abdominal entry, normal upper abdominal survey. Normal omentum, small bowel. Large bowels with some filmy adhesions to the left pelvic sidewall and the left adhesions. Filmy adhesions also noted from the sigmoid to the posterior uterus (some of the adhesions biopsied). Uterus 8cm and normal in appearance. Atrophic appearing adnexa. Mapping successful to bilateral SLNs. No obvious adenopathy. Minimal adhesions of the bladder to the LUS. No intra-abdominal or pelvic evidence of disease. Given concentrated appearing urine and low UOP, cystoscopy was performed. Bladder dome intact (bubbles seen). Good efflux noted from bilateral ureteral orifices.    04/11/2021 Pathologic Stage   Stage IA serous uterine cancer No LVI, negative SLNs Omentum negative, washings nondiagnostic (insufficient cellularity) HER2 negative   04/18/2021 Cancer Staging   Staging form: Corpus Uteri - Carcinoma and Carcinosarcoma, AJCC 8th Edition - Pathologic stage from 04/18/2021: FIGO Stage IA (pT1a, pN0, cM0) - Signed by Artis Delay, MD on 04/18/2021 Stage prefix: Initial diagnosis   05/05/2021 Procedure   Successful placement of a RIGHT internal jugular  approach power injectable Port-A-Cath.   The tip of the catheter is positioned within the proximal RIGHT atrium. The catheter is ready for immediate use   05/09/2021 - 08/22/2021 Chemotherapy   Patient is on Treatment Plan : UTERINE Carboplatin AUC 6 / Paclitaxel q21d     07/27/2021 - 08/17/2021 Radiation Therapy   07/27/2021 through 08/17/2021 Site Technique Total Dose (Gy) Dose per Fx (Gy) Completed Fx Beam Energies  Vagina: Pelvis HDR-brachy 30/30 6 5/5 Ir-192       09/21/2021 Imaging   CT CHEST IMPRESSION   1. Left upper lobe surgical changes. Increased branching soft tissue density compared to 2016. Most likely related to progressive mucoid impaction. Locally recurrent neoplasm is felt unlikely, especially given remote lung cancer history. This could be re-evaluated with chest CT at 3 to 6 months. PET would likely be unhelpful, as either infectious mucoid impaction or local recurrence would be tracer avid. 2. No typical findings of uterine cancer metastatic disease within the chest. 3. Aortic atherosclerosis (ICD10-I70.0), coronary artery atherosclerosis and emphysema (ICD10-J43.9).   CT ABDOMEN AND PELVIS IMPRESSION   1. Interval hysterectomy, without findings of residual or metastatic disease. 2. Apparent gastric wall thickening could be due to underdistention. Correlate with symptoms of gastritis. 3. Left pelvic sidewall seroma/lymphangioma.         Interval History: Doing well.  Denies any vaginal bleeding or discharge.  Using her vaginal dilator once a week.  Reports some constipation after starting calcium and vitamin D, improved.  Now slightly worse again secondary to antihistamine.  Past Medical/Surgical History: Past Medical History:  Diagnosis Date   Anxiety    Breast cancer (HCC)    2014 and 2015   Cancer (HCC)    lung cancer    DDD (degenerative disc disease)    neck  Dry eye    Eczema    Family history of breast cancer    Family history of stomach cancer     GERD (gastroesophageal reflux disease)    occasional - no current med.   Goiter    History of breast cancer    History of lung cancer    non-small cell - left upper lobe   History of radiation therapy    Vagina- 045/04/23-05/23/23- Dr. Antony Blackbird   IBS (irritable bowel syndrome)    Non-allergic rhinitis    sinusitis   Osteoarthritis    Personal history of radiation therapy 2014   PONV (postoperative nausea and vomiting)    Radiation 07/03/12-07/09/12   Right upper lung 5400 cGy   Thyroid nodule, toxic or with hyperthyroidism    no current med.   Urinary, incontinence, stress female    Vasomotor rhinitis    Vertigo     Past Surgical History:  Procedure Laterality Date   ANTERIOR CERVICAL DECOMP/DISCECTOMY FUSION N/A 12/22/2019   Procedure: Cervical four-five Cervical five-six Cervical six-seven Anterior cervical decompression/discectomy/fusion;  Surgeon: Maeola Harman, MD;  Location: [facility] OR;  Service: Neurosurgery;  Laterality: N/A;   APPENDECTOMY  1971   BREAST LUMPECTOMY Right 2014   BREAST RECONSTRUCTION WITH PLACEMENT OF TISSUE EXPANDER AND FLEX HD (ACELLULAR HYDRATED DERMIS) Right 05/20/2013   Procedure: IMMEDIATE RIGHT BREAST RECONSTRUCTION WITH LATISSAMUS MUSCLE FLAP AND PLACEMENT OF TISSUE EXPANDER TO RIGHT BREAST;  Surgeon: Wayland Denis, DO;  Location: [facility] OR;  Service: Plastics;  Laterality: Right;   BREAST REDUCTION WITH MASTOPEXY Left 10/22/2013   Procedure: BREAST REDUCTION/MASTOPEXY TO LEFT BREAST WITH LIPOSUCTION/FAT GRAFTING FOR SYMMETRY;  Surgeon: Wayland Denis, DO;  Location: [facility];  Service: Plastics;  Laterality: Left;   CYSTOSCOPY N/A 04/11/2021   Procedure: CYSTOSCOPY;  Surgeon: Carver Fila, MD;  Location: [facility];  Service: Gynecology;  Laterality: N/A;   DE QUERVAIN'S RELEASE Left 07/31/2002   FRACTURE SURGERY Left 2019   Torn meniscus; bone fragments removed   ganglion cyst R hand     1980s   IR IMAGING GUIDED PORT INSERTION   05/04/2021   IR REMOVAL TUN ACCESS W/ PORT W/O FL MOD SED  11/29/2021   IR US GUIDE VASC ACCESS RIGHT  05/04/2021   KNEE SURGERY Left    torn meniscus   LUNG REMOVAL, PARTIAL     2014   MASTECTOMY Right    2015   MASTECTOMY W/ SENTINEL NODE BIOPSY Right 05/20/2013   Procedure: RIGHT SIMPLEMASTECTOMY AND SENTINEL NODE   Sampling;  Surgeon: Maisie Fus A. Cornett, MD;  Location: [facility] OR;  Service: General;  Laterality: Right;   MOUTH SURGERY Right may 2014   "keratocystic odotogenic tumor" - X 2   NASAL SINUS SURGERY  ?1988-1990   "at least 3" (05/21/2013)   PARTIAL MASTECTOMY WITH NEEDLE LOCALIZATION  04/10/2012   Procedure: PARTIAL MASTECTOMY WITH NEEDLE LOCALIZATION;  Surgeon: Maisie Fus A. Cornett, MD;  Location: [facility];  Service: General;  Laterality: Right;   REDUCTION MAMMAPLASTY Left 2015   REMOVAL OF TISSUE EXPANDER AND PLACEMENT OF IMPLANT Right 10/22/2013   Procedure: REMOVAL OF RIGHT TISSUE EXPANDER WITH PLACEMENT OF RIGHT BREAST IMPLANT;  Surgeon: Wayland Denis, DO;  Location: [facility];  Service: Plastics;  Laterality: Right;   ROBOTIC ASSISTED BILATERAL SALPINGO OOPHERECTOMY Bilateral 04/11/2021   Procedure: XI ROBOTIC ASSISTED HYSTERECTOMY WITH BILATERAL SALPINGO OOPHORECTOMY; OMENTAL BIOPSY;  Surgeon: Carver Fila, MD;  Location: [facility];  Service: Gynecology;  Laterality: Bilateral;   SENTINEL NODE BIOPSY N/A 04/11/2021   Procedure: SENTINEL NODE BIOPSY;  Surgeon: Carver Fila, MD;  Location: [facility];  Service: Gynecology;  Laterality: N/A;   SEPTOPLASTY     & sinus surgeries 1980s-1990s   THORACOTOMY Left 07/14/2004   TRIGGER FINGER RELEASE Bilateral    "I've had OR on q finger" (05/21/2013)   TRIGGER FINGER RELEASE Right 07/14/1999   index and little fingers   TRIGGER FINGER RELEASE Right 07/17/2002   long and ring fingers   TRIGGER FINGER RELEASE Left 07/31/2002   little, ring and index fingers   VIDEO ASSISTED THORACOSCOPY (VATS)/ LOBECTOMY  Left 07/14/2004   upper tri-segmentectomy   WISDOM TOOTH EXTRACTION     "3"    Family History  Problem Relation Age of Onset   Anesthesia problems Mother        post-op N/V   Thyroid nodules Mother    Other Mother        DDD, spinal stenosis   High blood pressure Mother    Hearing loss Mother    Leukemia Father        CML, d. 48s   High blood pressure Father    Hearing loss Father    Breast cancer Sister        BRCA testing in 2006 timeframe is neg   Thyroid nodules Sister    Thyroid nodules Sister    Hearing loss Brother    Breast cancer Maternal Aunt        dx >50   Thyroid nodules Maternal Aunt    Thyroid nodules Maternal Aunt    Hearing loss Paternal Aunt    Hearing loss Paternal Uncle    Stomach cancer Maternal Grandmother    Anesthesia problems Daughter        post-op N/V   Stomach cancer Maternal Great-grandmother        assumed stomach cancer   Prostate cancer Neg Hx    Pancreatic cancer Neg Hx    Endometrial cancer Neg Hx    Ovarian cancer Neg Hx    Colon cancer Neg Hx     Social History   Socioeconomic History   Marital status: Married    Spouse name: Not on file   Number of children: 1   Years of education: Not on file   Highest education level: Not on file  Occupational History    Employer: [employer]  Tobacco Use   Smoking status: Former    Current packs/day: 0.00    Types: Cigarettes    Quit date: 05/06/2004    Years since quitting: 19.1   Smokeless tobacco: Never  Vaping Use   Vaping status: Never Used  Substance and Sexual Activity   Alcohol use: Yes    Alcohol/week: 7.0 standard drinks of alcohol    Types: 7 Glasses of wine per week    Comment: 1 glass wine daily   Drug use: Never   Sexual activity: Not Currently  Other Topics Concern   Not on file  Social History Narrative   Lives at home with spouse   Right handed   Caffeine: 2-3 cups of coffee/day   Social Drivers of Corporate investment banker Strain: Not on  file  Food Insecurity: Not on file  Transportation Needs: Not on file  Physical Activity: Not on file  Stress: Not on file  Social Connections: Unknown (08/07/2021)   [facility]   Social Network  Social Network: Not on file    Current Medications:  Current Outpatient Medications:    acetaminophen (TYLENOL) 500 MG tablet, Take 1,000 mg by mouth at bedtime as needed (Arthritis pain)., Disp: , Rfl:    Calcium Carb-Cholecalciferol 500-10 MG-MCG TABS, Take 1 tablet by mouth 2 (two) times daily., Disp: , Rfl:    calcium carbonate (TUMS) 500 MG chewable tablet, Chew 1 tablet by mouth at bedtime., Disp: , Rfl:    Cholecalciferol (VITAMIN D) 50 MCG (2000 UT) CAPS, Take 1 capsule by mouth daily., Disp: , Rfl:    cycloSPORINE (RESTASIS) 0.05 % ophthalmic emulsion, Place 1 drop into both eyes 2 (two) times daily. , Disp: , Rfl:    esomeprazole (NEXIUM) 20 MG capsule, Take 20 mg by mouth daily., Disp: , Rfl:    fluocinonide gel (LIDEX) 0.05 %, Apply 1 application topically daily as needed (canker sores). , Disp: , Rfl:    fluticasone (FLONASE) 50 MCG/ACT nasal spray, Place 2 sprays into both nostrils daily. , Disp: , Rfl:    Lactobacillus (ACIDOPHILUS PO), Take 5 mg by mouth daily., Disp: , Rfl:    Magnesium 250 MG TABS, Take by mouth daily., Disp: , Rfl:    melatonin 5 MG TABS, Take 5 mg by mouth., Disp: , Rfl:    Olopatadine HCl 0.2 % SOLN, Place 1 drop into both eyes daily as needed (Allergies/Seasonal)., Disp: , Rfl:    Polyethyl Glycol-Propyl Glycol (SYSTANE) 0.4-0.3 % SOLN, Place 1 drop into both eyes daily as needed (Dry eye)., Disp: , Rfl:    vitamin E 400 UNIT capsule, Take 400 Units by mouth daily. , Disp: , Rfl:   Review of Systems: + Mouth sores, ringing in ears, constipation, frequency, joint pain, back pain, itching, rash, numbness (all at baseline, no new symptoms) Denies appetite changes, fevers, chills, fatigue, unexplained weight changes. Denies  hearing loss, neck lumps or masses or voice changes. Denies cough or wheezing.  Denies shortness of breath. Denies chest pain or palpitations. Denies leg swelling. Denies abdominal distention, pain, blood in stools, diarrhea, nausea, vomiting, or early satiety. Denies pain with intercourse, dysuria, hematuria or incontinence. Denies hot flashes, pelvic pain, vaginal bleeding or vaginal discharge.   Denies dizziness, headaches or seizures. Denies swollen lymph nodes or glands, denies easy bruising or bleeding. Denies anxiety, depression, confusion, or decreased concentration.  Physical Exam: BP 132/84 (BP Location: Left Arm, Patient Position: Sitting)   Pulse 92   Temp 97.9 F (36.6 C) (Oral)   Resp 19   Wt 158 lb (71.7 kg)   SpO2 100%   BMI 27.12 kg/m  General: Alert, oriented, no acute distress.  HEENT: Normocephalic, atraumatic. Sclera anicteric.  Chest: Clear to auscultation bilaterally. No wheezes, rhonchi, or rales. Cardiovascular: Regular rate and rhythm, no murmurs, rubs, or gallops.  Abdomen: Normoactive bowel sounds. Soft, nondistended, nontender to palpation. No masses or hepatosplenomegaly appreciated.  Well-healed incisions. Extremities: Grossly normal range of motion. Warm, well perfused.   Skin: No rashes or lesions.  GU: Normal external female genitalia.  Speculum exam shows mildly atrophic vaginal mucosa, some radiation changes noted at the vaginal apex.  Some laxity of the apical vagina. No lesions, bleeding, or discharge. On bimanual exam, cuff intact, no nodularity or masses.  Exam findings confirmed on rectovaginal exam.   Laboratory & Radiologic Studies: CT C/A/P 06/03/23: 1. Prior hysterectomy without evidence of local recurrence or metastatic disease in the chest, abdomen or pelvis. 2. Similar appearance of the nodular branching opacity in  the left upper lobe measuring 15 x 12 mm and extending into the perihilar lingular portion measuring 16 x 9 mm, unchanged.  Continued attention on follow-up imaging suggested. 3. Similar appearance of the subtle hypodensity in the head of the pancreas measuring 12 mm, possibly reflecting fatty infiltration but nonspecific. Consider further evaluation with pre and post contrast-enhanced abdominal MRI/MRCP. 4. Colonic diverticulosis without findings of acute diverticulitis.  Assessment & Plan: [name] is a 73 y.o. woman with a history of Stage IA serous uterine cancer who presents for surveillance. Completed adjuvant chemotherapy and vaginal brachytherapy in 07/2021. HER2 negative. MMR intact.   Patient is doing well.  She is NED on exam today.  Last imaging in March 2025 without evidence of recurrent disease.     Per NCCN surveillance recommendations, in the setting of high risk endometrial cancer, we will plan on visits every 3 months for the first 2-3 years. These will alternate between my office and radiation oncology. I will see her back in 6 months.   We reviewed signs and symptoms that would be concerning for cancer recurrence and I stressed the importance of calling if she develops any of these between visits.   20 minutes of total time was spent for this patient encounter, including preparation, face-to-face counseling with the patient and coordination of care, and documentation of the encounter.  Eugene Garnet, MD  Division of Gynecologic Oncology  Department of Obstetrics and Gynecology  [facility]

## 2023-10-04 NOTE — Progress Notes (Signed)
 Radiation Oncology         [phone] ________________________________  Name: [name] MRN: [id number]  Date: 10/07/2023  DOB: 03/01/1951  Follow-Up Visit Note  CC: Arloa Elsie SAUNDERS, MD  Arloa Elsie SAUNDERS, MD  No diagnosis found.  Diagnosis: The encounter diagnosis was Endometrial carcinoma (HCC).   FIGO Stage IA (pT1a, pN0, cM0) Endometrial Cancer, high-grade serous  Interval Since Last Radiation: 2 years, 1 month, and 19 days   2) Intent: Curative Radiation Treatment Dates: 07/27/2021 through 08/17/2021 Site Technique Total Dose (Gy) Dose per Fx (Gy) Completed Fx Beam Energies  Vagina: Pelvis HDR-brachy 30/30 6 5/5 Ir-192    1) DIAGNOSIS: Stage 0 (Tis N0 M0) multifocal DCIS of the right breast INDICATION FOR TREATMENT: Curative TREATMENT DATES: 05/22/2012 through 07/04/2012                                                     SITE/DOSE:  Right breast 5000 cGy 25 sessions right breast boost 1000 cGy 5 sessions                        BEAMS/ENERGY:  Tangential fields to the right breast, 6 MV photons. Electron beam boost   15 MEV electrons  Narrative:  The patient returns today for routine follow-up. She was last seen here for follow-up on 04/08/23.   Since her last visit, she presented for a restaging CT CAP with contrast on 06/03/23 which showed no evidence of local recurrence of metastatic disease in the chest, abdomen, or pelvis. The nodular opacity in the LUL was also redemonstrated and appeared stable, measuring 1.5 x 1.2 cm. The extended portion of the opacity into perihilar lingular also appeared to be stable. Lastly, the nonspecific subtle hypodensity in the head of the pancreas measuring 12 mm (possibly reflecting fatty infiltration) was also redemonstrated and appears stable.   She was also seen by Dr. Viktoria on 06/28/23 for a routine follow-up visit. During which time, she was noted to be doing well and denied any symptoms concerning for disease recurrence. She  was also noted as NED on examination.   No other significant oncologic interval history since the patient was last seen.   ***  Allergies:  is allergic to augmentin [amoxicillin-pot clavulanate], morphine  and codeine, amoxicillin, asa [aspirin], banana, ceftin [cefuroxime axetil], citrus, lactose intolerance (gi), orange oil, penicillins, cefuroxime, clavulanic acid, tetanus-diphth-acell pertussis, and tape.  Meds: Current Outpatient Medications  Medication Sig Dispense Refill   acetaminophen  (TYLENOL ) 500 MG tablet Take 1,000 mg by mouth at bedtime as needed (Arthritis pain).     Calcium Carb-Cholecalciferol 500-10 MG-MCG TABS Take 1 tablet by mouth 2 (two) times daily.     calcium carbonate (TUMS) 500 MG chewable tablet Chew 1 tablet by mouth at bedtime.     Cholecalciferol (VITAMIN D) 50 MCG (2000 UT) CAPS Take 1 capsule by mouth daily.     cycloSPORINE  (RESTASIS ) 0.05 % ophthalmic emulsion Place 1 drop into both eyes 2 (two) times daily.      esomeprazole (NEXIUM) 20 MG capsule Take 20 mg by mouth daily.     fluocinonide  gel (LIDEX ) 0.05 % Apply 1 application topically daily as needed (canker sores).      fluticasone  (FLONASE ) 50 MCG/ACT nasal spray Place 2 sprays into both nostrils daily.  Lactobacillus (ACIDOPHILUS PO) Take 5 mg by mouth daily.     Magnesium 250 MG TABS Take by mouth daily.     melatonin 5 MG TABS Take 5 mg by mouth.     Olopatadine  HCl 0.2 % SOLN Place 1 drop into both eyes daily as needed (Allergies/Seasonal).     Polyethyl Glycol-Propyl Glycol (SYSTANE) 0.4-0.3 % SOLN Place 1 drop into both eyes daily as needed (Dry eye).     vitamin E  400 UNIT capsule Take 400 Units by mouth daily.      No current facility-administered medications for this encounter.    Physical Findings: The patient is in no acute distress. Patient is alert and oriented.  vitals were not taken for this visit. .  No significant changes. Lungs are clear to auscultation bilaterally. Heart  has regular rate and rhythm. No palpable cervical, supraclavicular, or axillary adenopathy. Abdomen soft, non-tender, normal bowel sounds.  On pelvic examination the external genitalia were unremarkable. A speculum exam was performed. There are no mucosal lesions noted in the vaginal vault. A Pap smear was obtained of the proximal vagina. On bimanual and rectovaginal examination there were no pelvic masses appreciated. ***   Lab Findings: Lab Results  Component Value Date   WBC 4.1 03/05/2023   HGB 13.1 03/05/2023   HCT 40.3 03/05/2023   MCV 93.9 03/05/2023   PLT 259 03/05/2023    Radiographic Findings: No results found.  Impression:  The encounter diagnosis was Endometrial carcinoma (HCC).   FIGO Stage IA (pT1a, pN0, cM0) Endometrial Cancer, high-grade serous  The patient is recovering from the effects of radiation.  ***  Plan:  ***   *** minutes of total time was spent for this patient encounter, including preparation, face-to-face counseling with the patient and coordination of care, physical exam, and documentation of the encounter. ____________________________________  Lynwood CHARM Nasuti, PhD, MD  This document serves as a record of services personally performed by Lynwood Nasuti, MD. It was created on his behalf by Dorthy Fuse, a trained medical scribe. The creation of this record is based on the scribe's personal observations and the provider's statements to them. This document has been checked and approved by the attending provider.

## 2023-10-07 ENCOUNTER — Ambulatory Visit
Admission: RE | Admit: 2023-10-07 | Discharge: 2023-10-07 | Disposition: A | Discharge status: Home / Self Care | Payer: Self-pay | Source: Ambulatory Visit | Attending: Radiation Oncology | Admitting: Radiation Oncology

## 2023-10-07 ENCOUNTER — Encounter: Payer: Self-pay | Admitting: Radiation Oncology

## 2023-10-07 VITALS — BP 131/74 | HR 81 | Temp 97.7°F | Resp 18 | Ht 63.0 in | Wt 157.8 lb

## 2023-10-07 DIAGNOSIS — Z86000 Personal history of in-situ neoplasm of breast: Secondary | ICD-10-CM | POA: Insufficient documentation

## 2023-10-07 DIAGNOSIS — Z8542 Personal history of malignant neoplasm of other parts of uterus: Secondary | ICD-10-CM | POA: Insufficient documentation

## 2023-10-07 DIAGNOSIS — Z79899 Other long term (current) drug therapy: Secondary | ICD-10-CM | POA: Insufficient documentation

## 2023-10-07 DIAGNOSIS — Z923 Personal history of irradiation: Secondary | ICD-10-CM | POA: Diagnosis not present

## 2023-10-07 DIAGNOSIS — C541 Malignant neoplasm of endometrium: Secondary | ICD-10-CM

## 2023-10-07 NOTE — Progress Notes (Signed)
 [name] is here today for follow up post radiation to the pelvic.  They completed their radiation on: 08/17/21  Does the patient complain of any of the following:  Pain:Denies Abdominal bloating: IBS related Diarrhea/Constipation: Denies Nausea/Vomiting: Denies Vaginal Discharge: Denies Blood in Urine or Stool: Denies Urinary Issues (dysuria/incomplete emptying/ incontinence/ increased frequency/urgency): Denies Does patient report using vaginal dilator 2-3 times a week and/or sexually active 2-3 weeks: One a week Post radiation skin changes: Denies    Additional comments if applicable: None at this time.  BP 131/74 (Patient Position: Sitting, Cuff Size: Large)   Pulse 81   Temp 97.7 F (36.5 C)   Resp 18   Ht 5' 3 (1.6 m)   Wt 157 lb 12.8 oz (71.6 kg)   SpO2 100%   BMI 27.95 kg/m

## 2023-10-25 HISTORY — PX: MOUTH SURGERY: SHX715

## 2023-11-26 ENCOUNTER — Other Ambulatory Visit: Payer: Self-pay | Admitting: Family Medicine

## 2023-11-26 DIAGNOSIS — Z1231 Encounter for screening mammogram for malignant neoplasm of breast: Secondary | ICD-10-CM

## 2023-12-25 ENCOUNTER — Encounter: Payer: Self-pay | Admitting: Gynecologic Oncology

## 2023-12-27 ENCOUNTER — Encounter: Payer: Self-pay | Admitting: Gynecologic Oncology

## 2023-12-27 ENCOUNTER — Inpatient Hospital Stay: Attending: Gynecologic Oncology | Admitting: Gynecologic Oncology

## 2023-12-27 VITALS — BP 136/75 | HR 69 | Temp 98.3°F | Resp 19 | Wt 159.4 lb

## 2023-12-27 DIAGNOSIS — Z9221 Personal history of antineoplastic chemotherapy: Secondary | ICD-10-CM | POA: Insufficient documentation

## 2023-12-27 DIAGNOSIS — Z90722 Acquired absence of ovaries, bilateral: Secondary | ICD-10-CM | POA: Diagnosis not present

## 2023-12-27 DIAGNOSIS — Z9079 Acquired absence of other genital organ(s): Secondary | ICD-10-CM | POA: Insufficient documentation

## 2023-12-27 DIAGNOSIS — Z8542 Personal history of malignant neoplasm of other parts of uterus: Secondary | ICD-10-CM | POA: Diagnosis present

## 2023-12-27 DIAGNOSIS — Z86000 Personal history of in-situ neoplasm of breast: Secondary | ICD-10-CM | POA: Insufficient documentation

## 2023-12-27 DIAGNOSIS — Z90710 Acquired absence of both cervix and uterus: Secondary | ICD-10-CM | POA: Insufficient documentation

## 2023-12-27 DIAGNOSIS — Z923 Personal history of irradiation: Secondary | ICD-10-CM | POA: Diagnosis not present

## 2023-12-27 DIAGNOSIS — C541 Malignant neoplasm of endometrium: Secondary | ICD-10-CM

## 2023-12-27 DIAGNOSIS — Z85118 Personal history of other malignant neoplasm of bronchus and lung: Secondary | ICD-10-CM | POA: Insufficient documentation

## 2023-12-27 NOTE — Patient Instructions (Signed)
 It was good to see you today.  I do not see or feel any evidence of cancer recurrence on your exam.  I will see you for follow-up in 6 months.  As always, if you develop any new and concerning symptoms before your next visit, please call to see me sooner.

## 2023-12-27 NOTE — Progress Notes (Signed)
 Gynecologic Oncology Return Clinic Visit  12/27/23  Reason for Visit: surveillance   Treatment History: Oncology History Overview Note  CA-125 on 04/06/21: 10.2 High grade serous, Her 2 neg Genetics from 04/30/18: VUS   MMR IHC intact   History of lung cancer (Resolved)  04/28/2012 Initial Diagnosis   Lung cancer, diagnosed in March of 2006 stage II a status post left trisegmentectomy with lymph node dissection followed by 4 cycles of adjuvant chemotherapy   05/06/2018 Genetic Testing   MEN1 c.875C>T and MSH3 c.1366G>A VUS identified on the multicancer gene panel.  The Multi-Gene Panel offered by [facility] includes sequencing and/or deletion duplication testing of the following 84 genes: AIP, ALK, APC, ATM, AXIN2,BAP1,  BARD1, BLM, BMPR1A, BRCA1, BRCA2, BRIP1, CASR, CDC73, CDH1, CDK4, CDKN1B, CDKN1C, CDKN2A (p14ARF), CDKN2A (p16INK4a), CEBPA, CHEK2, CTNNA1, DICER1, DIS3L2, EGFR (c.2369C>T, p.Thr790Met variant only), EPCAM (Deletion/duplication testing only), FH, FLCN, GATA2, GPC3, GREM1 (Promoter region deletion/duplication testing only), HOXB13 (c.251G>A, p.Gly84Glu), HRAS, KIT, MAX, MEN1, MET, MITF (c.952G>A, p.Glu318Lys variant only), MLH1, MSH2, MSH3, MSH6, MUTYH, NBN, NF1, NF2, NTHL1, PALB2, PDGFRA, PHOX2B, PMS2, POLD1, POLE, POT1, PRKAR1A, PTCH1, PTEN, RAD50, RAD51C, RAD51D, RB1, RECQL4, RET, RUNX1, SDHAF2, SDHA (sequence changes only), SDHB, SDHC, SDHD, SMAD4, SMARCA4, SMARCB1, SMARCE1, STK11, SUFU, TERC, TERT, TMEM127, TP53, TSC1, TSC2, VHL, WRN and WT1.  The report date is May 06, 2018.   History of ductal carcinoma in situ (DCIS) of breast  04/28/2012 Initial Diagnosis   Ductal carcinoma in situ of right breast diagnosed in December of 2013, status post right lumpectomy in January of 2014   05/06/2018 Genetic Testing   MEN1 c.875C>T and MSH3 c.1366G>A VUS identified on the multicancer gene panel.  The Multi-Gene Panel offered by [facility] includes sequencing and/or deletion duplication  testing of the following 84 genes: AIP, ALK, APC, ATM, AXIN2,BAP1,  BARD1, BLM, BMPR1A, BRCA1, BRCA2, BRIP1, CASR, CDC73, CDH1, CDK4, CDKN1B, CDKN1C, CDKN2A (p14ARF), CDKN2A (p16INK4a), CEBPA, CHEK2, CTNNA1, DICER1, DIS3L2, EGFR (c.2369C>T, p.Thr790Met variant only), EPCAM (Deletion/duplication testing only), FH, FLCN, GATA2, GPC3, GREM1 (Promoter region deletion/duplication testing only), HOXB13 (c.251G>A, p.Gly84Glu), HRAS, KIT, MAX, MEN1, MET, MITF (c.952G>A, p.Glu318Lys variant only), MLH1, MSH2, MSH3, MSH6, MUTYH, NBN, NF1, NF2, NTHL1, PALB2, PDGFRA, PHOX2B, PMS2, POLD1, POLE, POT1, PRKAR1A, PTCH1, PTEN, RAD50, RAD51C, RAD51D, RB1, RECQL4, RET, RUNX1, SDHAF2, SDHA (sequence changes only), SDHB, SDHC, SDHD, SMAD4, SMARCA4, SMARCB1, SMARCE1, STK11, SUFU, TERC, TERT, TMEM127, TP53, TSC1, TSC2, VHL, WRN and WT1.  The report date is May 06, 2018.   Endometrial carcinoma (HCC)  01/27/2021 Initial Diagnosis   Patient began having vaginal spotting on November 10th.  She had 4 days of spotting which she describes as seeing red when she wiped and having a little brown discharge on her liner.  Bleeding then stopped completely.  She saw her primary care provider and underwent an ultrasound.  Ultrasound showed thickened endometrium measuring 14 mm.  She then started bleeding again on December 14 and had a week of bleeding with a little bit more red blood this time.  She ultimately saw OB/GYN and had an endometrial biopsy.  This was first attempted however secondary to cervical stenosis, patient returned the following day after using intravaginal misoprostol.  Endometrial biopsy on 12/22 revealed FIGO grade 2/3 endometrial cancer.   02/27/2021 Imaging   US  pelvis Thickened endometrium with possible 14 mm endometrial mass. In the setting of post-menopausal bleeding, endometrial sampling is indicated to exclude carcinoma   03/06/2021 Initial Biopsy   Endometrial biopsy revealed FIGO grade 2/3 endometrial cancer  03/24/2021 Initial Diagnosis   Endometrial carcinoma (HCC)   03/30/2021 Imaging   CT A/P: 1. Abnormal thickening of the endometrium, as seen on prior ultrasound and consistent with recently diagnosed endometrial malignancy. 2. No evidence of lymphadenopathy or metastatic disease in the abdomen or pelvis. 3. Subcapsular hyperenhancing lesion of the lateral inferior right lobe of the liver, hepatic segment VI is only very faintly appreciated on this portal venous phase contrast examination, better characterized as a benign focal nodular hyperplasia by prior MR. 4. Diverticulosis.   04/11/2021 Surgery   Robotic-assisted laparoscopic total hysterectomy with bilateral salpingoophorectomy, SLN biopsy, omental biopsy, cystoscopy   Findings: On EUA, small mobile uterus. On intra-abdominal entry, normal upper abdominal survey. Normal omentum, small bowel. Large bowels with some filmy adhesions to the left pelvic sidewall and the left adhesions. Filmy adhesions also noted from the sigmoid to the posterior uterus (some of the adhesions biopsied). Uterus 8cm and normal in appearance. Atrophic appearing adnexa. Mapping successful to bilateral SLNs. No obvious adenopathy. Minimal adhesions of the bladder to the LUS. No intra-abdominal or pelvic evidence of disease. Given concentrated appearing urine and low UOP, cystoscopy was performed. Bladder dome intact (bubbles seen). Good efflux noted from bilateral ureteral orifices.    04/11/2021 Pathologic Stage   Stage IA serous uterine cancer No LVI, negative SLNs Omentum negative, washings nondiagnostic (insufficient cellularity) HER2 negative   04/18/2021 Cancer Staging   Staging form: Corpus Uteri - Carcinoma and Carcinosarcoma, AJCC 8th Edition - Pathologic stage from 04/18/2021: FIGO Stage IA (pT1a, pN0, cM0) - Signed by Lonn Hicks, MD on 04/18/2021 Stage prefix: Initial diagnosis   05/05/2021 Procedure   Successful placement of a RIGHT internal jugular  approach power injectable Port-A-Cath.   The tip of the catheter is positioned within the proximal RIGHT atrium. The catheter is ready for immediate use   05/09/2021 - 08/22/2021 Chemotherapy   Patient is on Treatment Plan : UTERINE Carboplatin  AUC 6 / Paclitaxel  q21d     07/27/2021 - 08/17/2021 Radiation Therapy   07/27/2021 through 08/17/2021 Site Technique Total Dose (Gy) Dose per Fx (Gy) Completed Fx Beam Energies  Vagina: Pelvis HDR-brachy 30/30 6 5/5 Ir-192       09/21/2021 Imaging   CT CHEST IMPRESSION   1. Left upper lobe surgical changes. Increased branching soft tissue density compared to 2016. Most likely related to progressive mucoid impaction. Locally recurrent neoplasm is felt unlikely, especially given remote lung cancer history. This could be re-evaluated with chest CT at 3 to 6 months. PET would likely be unhelpful, as either infectious mucoid impaction or local recurrence would be tracer avid. 2. No typical findings of uterine cancer metastatic disease within the chest. 3. Aortic atherosclerosis (ICD10-I70.0), coronary artery atherosclerosis and emphysema (ICD10-J43.9).   CT ABDOMEN AND PELVIS IMPRESSION   1. Interval hysterectomy, without findings of residual or metastatic disease. 2. Apparent gastric wall thickening could be due to underdistention. Correlate with symptoms of gastritis. 3. Left pelvic sidewall seroma/lymphangioma.         Interval History: Doing well.  Denies any vaginal bleeding.  Reports baseline IBS and bowel function, no significant changes recently.  Denies any abdominal or pelvic pain.  Past Medical/Surgical History: Past Medical History:  Diagnosis Date   Anxiety    Breast cancer (HCC)    2014 and 2015   Cancer (HCC)    lung cancer    DDD (degenerative disc disease)    neck   Dry eye    Eczema  Family history of breast cancer    Family history of stomach cancer    GERD (gastroesophageal reflux disease)    occasional - no current  med.   Goiter    History of breast cancer    History of lung cancer    non-small cell - left upper lobe   History of radiation therapy    Vagina- 045/04/23-05/23/23- Dr. Lynwood Nasuti   IBS (irritable bowel syndrome)    Non-allergic rhinitis    sinusitis   Osteoarthritis    Personal history of radiation therapy 2014   PONV (postoperative nausea and vomiting)    Radiation 07/03/12-07/09/12   Right upper lung 5400 cGy   Thyroid  nodule, toxic or with hyperthyroidism    no current med.   Urinary, incontinence, stress female    Vasomotor rhinitis    Vertigo     Past Surgical History:  Procedure Laterality Date   ANTERIOR CERVICAL DECOMP/DISCECTOMY FUSION N/A 12/22/2019   Procedure: Cervical four-five Cervical five-six Cervical six-seven Anterior cervical decompression/discectomy/fusion;  Surgeon: Unice Pac, MD;  Location: [facility];  Service: Neurosurgery;  Laterality: N/A;   APPENDECTOMY  1971   BREAST LUMPECTOMY Right 2014   BREAST RECONSTRUCTION WITH PLACEMENT OF TISSUE EXPANDER AND FLEX HD (ACELLULAR HYDRATED DERMIS) Right 05/20/2013   Procedure: IMMEDIATE RIGHT BREAST RECONSTRUCTION WITH LATISSAMUS MUSCLE FLAP AND PLACEMENT OF TISSUE EXPANDER TO RIGHT BREAST;  Surgeon: Estefana Reichert, DO;  Location: [facility] OR;  Service: Plastics;  Laterality: Right;   BREAST REDUCTION WITH MASTOPEXY Left 10/22/2013   Procedure: BREAST REDUCTION/MASTOPEXY TO LEFT BREAST WITH LIPOSUCTION/FAT GRAFTING FOR SYMMETRY;  Surgeon: Estefana Reichert, DO;  Location: [facility];  Service: Plastics;  Laterality: Left;   CYSTOSCOPY N/A 04/11/2021   Procedure: CYSTOSCOPY;  Surgeon: Viktoria Comer SAUNDERS, MD;  Location: [facility];  Service: Gynecology;  Laterality: N/A;   DE QUERVAIN'S RELEASE Left 07/31/2002   FRACTURE SURGERY Left 2019   Torn meniscus; bone fragments removed   ganglion cyst R hand     1980s   IR IMAGING GUIDED PORT INSERTION  05/04/2021   IR REMOVAL TUN ACCESS W/ PORT W/O FL MOD SED   11/29/2021   IR US  GUIDE VASC ACCESS RIGHT  05/04/2021   KNEE SURGERY Left    torn meniscus   LUNG REMOVAL, PARTIAL     2014   MASTECTOMY Right    2015   MASTECTOMY W/ SENTINEL NODE BIOPSY Right 05/20/2013   Procedure: RIGHT SIMPLEMASTECTOMY AND SENTINEL NODE   Sampling;  Surgeon: Debby A. Cornett, MD;  Location: [facility] OR;  Service: General;  Laterality: Right;   MOUTH SURGERY Right 07/2012   keratocystic odotogenic tumor - X 2   MOUTH SURGERY  10/2023   root canal   NASAL SINUS SURGERY  ?1988-1990   at least 3 (05/21/2013)   PARTIAL MASTECTOMY WITH NEEDLE LOCALIZATION  04/10/2012   Procedure: PARTIAL MASTECTOMY WITH NEEDLE LOCALIZATION;  Surgeon: Debby A. Cornett, MD;  Location: [facility];  Service: General;  Laterality: Right;   REDUCTION MAMMAPLASTY Left 2015   REMOVAL OF TISSUE EXPANDER AND PLACEMENT OF IMPLANT Right 10/22/2013   Procedure: REMOVAL OF RIGHT TISSUE EXPANDER WITH PLACEMENT OF RIGHT BREAST IMPLANT;  Surgeon: Estefana Reichert, DO;  Location: [facility];  Service: Plastics;  Laterality: Right;   ROBOTIC ASSISTED BILATERAL SALPINGO OOPHERECTOMY Bilateral 04/11/2021   Procedure: XI ROBOTIC ASSISTED HYSTERECTOMY WITH BILATERAL SALPINGO OOPHORECTOMY; OMENTAL BIOPSY;  Surgeon: Viktoria Comer SAUNDERS, MD;  Location: [facility];  Service: Gynecology;  Laterality: Bilateral;   SENTINEL NODE BIOPSY N/A 04/11/2021   Procedure: SENTINEL NODE BIOPSY;  Surgeon: Viktoria Comer SAUNDERS, MD;  Location: [facility];  Service: Gynecology;  Laterality: N/A;   SEPTOPLASTY     & sinus surgeries 1980s-1990s   THORACOTOMY Left 07/14/2004   TRIGGER FINGER RELEASE Bilateral    I've had OR on q finger (05/21/2013)   TRIGGER FINGER RELEASE Right 07/14/1999   index and little fingers   TRIGGER FINGER RELEASE Right 07/17/2002   long and ring fingers   TRIGGER FINGER RELEASE Left 07/31/2002   little, ring and index fingers   VIDEO ASSISTED THORACOSCOPY (VATS)/ LOBECTOMY Left  07/14/2004   upper tri-segmentectomy   WISDOM TOOTH EXTRACTION     3    Family History  Problem Relation Age of Onset   Anesthesia problems Mother        post-op N/V   Thyroid  nodules Mother    Other Mother        DDD, spinal stenosis   High blood pressure Mother    Hearing loss Mother    Leukemia Father        CML, d. 80s   High blood pressure Father    Hearing loss Father    Breast cancer Sister        BRCA testing in 2006 timeframe is neg   Thyroid  nodules Sister    Thyroid  nodules Sister    Hearing loss Brother    Breast cancer Maternal Aunt        dx >50   Thyroid  nodules Maternal Aunt    Thyroid  nodules Maternal Aunt    Hearing loss Paternal Aunt    Hearing loss Paternal Uncle    Stomach cancer Maternal Grandmother    Anesthesia problems Daughter        post-op N/V   Stomach cancer Maternal Great-grandmother        assumed stomach cancer   Prostate cancer Neg Hx    Pancreatic cancer Neg Hx    Endometrial cancer Neg Hx    Ovarian cancer Neg Hx    Colon cancer Neg Hx     Social History   Socioeconomic History   Marital status: Married    Spouse name: Not on file   Number of children: 1   Years of education: Not on file   Highest education level: Not on file  Occupational History    Employer: [employer]  Tobacco Use   Smoking status: Former    Current packs/day: 0.00    Types: Cigarettes    Quit date: 05/06/2004    Years since quitting: 19.6   Smokeless tobacco: Never  Vaping Use   Vaping status: Never Used  Substance and Sexual Activity   Alcohol use: Yes    Alcohol/week: 7.0 standard drinks of alcohol    Types: 7 Glasses of wine per week    Comment: 1 glass wine daily   Drug use: Never   Sexual activity: Not Currently  Other Topics Concern   Not on file  Social History Narrative   Lives at home with spouse   Right handed   Caffeine: 2-3 cups of coffee/day   Social Drivers of Corporate investment banker Strain: Not on  file  Food Insecurity: No Food Insecurity (12/25/2023)   Hunger Vital Sign    Worried About Running Out of Food in the Last Year: Never true    Ran Out of Food in the Last Year: Never true  Transportation  Needs: No Transportation Needs (12/25/2023)   PRAPARE - Administrator, Civil Service (Medical): No    Lack of Transportation (Non-Medical): No  Physical Activity: Not on file  Stress: Not on file  Social Connections: Unknown (08/07/2021)   Received [facility]   Social Network    Social Network: Not on file    Current Medications:  Current Outpatient Medications:    acetaminophen  (TYLENOL ) 500 MG tablet, Take 1,000 mg by mouth at bedtime as needed (Arthritis pain)., Disp: , Rfl:    Calcium Carb-Cholecalciferol 500-10 MG-MCG TABS, Take 1 tablet by mouth 2 (two) times daily., Disp: , Rfl:    calcium carbonate (TUMS) 500 MG chewable tablet, Chew 1 tablet by mouth at bedtime., Disp: , Rfl:    Cholecalciferol (VITAMIN D) 50 MCG (2000 UT) CAPS, Take 1 capsule by mouth daily., Disp: , Rfl:    cyclobenzaprine  (FLEXERIL ) 10 MG tablet, Take 5-10 mg by mouth daily as needed., Disp: , Rfl:    cycloSPORINE  (RESTASIS ) 0.05 % ophthalmic emulsion, Place 1 drop into both eyes 2 (two) times daily. , Disp: , Rfl:    esomeprazole (NEXIUM) 20 MG capsule, Take 20 mg by mouth daily., Disp: , Rfl:    fluocinonide  gel (LIDEX ) 0.05 %, Apply 1 application topically daily as needed (canker sores). , Disp: , Rfl:    fluticasone  (FLONASE ) 50 MCG/ACT nasal spray, Place 2 sprays into both nostrils daily. , Disp: , Rfl:    Lactobacillus (ACIDOPHILUS PO), Take 5 mg by mouth daily., Disp: , Rfl:    loratadine (CLARITIN) 10 MG tablet, Take 10 mg by mouth daily as needed for allergies., Disp: , Rfl:    Magnesium 250 MG TABS, Take by mouth daily., Disp: , Rfl:    melatonin 5 MG TABS, Take 5 mg by mouth., Disp: , Rfl:    Olopatadine  HCl 0.2 % SOLN, Place 1 drop into both eyes daily as needed  (Allergies/Seasonal)., Disp: , Rfl:    Polyethyl Glycol-Propyl Glycol (SYSTANE) 0.4-0.3 % SOLN, Place 1 drop into both eyes daily as needed (Dry eye)., Disp: , Rfl:    vitamin E  400 UNIT capsule, Take 400 Units by mouth daily. , Disp: , Rfl:   Review of Systems: Denies appetite changes, fevers, chills, fatigue, unexplained weight changes. Denies hearing loss, neck lumps or masses, mouth sores, ringing in ears or voice changes. Denies cough or wheezing.  Denies shortness of breath. Denies chest pain or palpitations. Denies leg swelling. Denies abdominal distention, pain, blood in stools, constipation, diarrhea, nausea, vomiting, or early satiety. Denies pain with intercourse, dysuria, frequency, hematuria or incontinence. Denies hot flashes, pelvic pain, vaginal bleeding or vaginal discharge.   Denies joint pain, back pain or muscle pain/cramps. Denies itching, rash, or wounds. Denies dizziness, headaches, numbness or seizures. Denies swollen lymph nodes or glands, denies easy bruising or bleeding. Denies anxiety, depression, confusion, or decreased concentration.  Physical Exam: BP 136/75 (BP Location: Left Arm, Patient Position: Sitting)   Pulse 69   Temp 98.3 F (36.8 C) (Oral)   Resp 19   Wt 159 lb 6.4 oz (72.3 kg)   SpO2 100%   BMI 28.24 kg/m  General: Alert, oriented, no acute distress.  HEENT: Normocephalic, atraumatic. Sclera anicteric.  Chest: Clear to auscultation bilaterally. No wheezes, rhonchi, or rales. Cardiovascular: Regular rate and rhythm, no murmurs, rubs, or gallops.  Abdomen: Normoactive bowel sounds. Soft, nondistended, nontender to palpation. No masses or hepatosplenomegaly appreciated.  Well-healed incisions. Extremities: Grossly normal range  of motion. Warm, well perfused.   Skin: No rashes or lesions.  GU: Normal external female genitalia.  Speculum exam shows mildly atrophic vaginal mucosa, some radiation changes noted at the vaginal apex.  Some laxity of  the apical vagina. No lesions, bleeding, or discharge. On bimanual exam, cuff intact, no nodularity or masses.  Exam findings confirmed on rectovaginal exam, hemorrhoids noted.   Laboratory & Radiologic Studies: None new  Assessment & Plan: [name] is a 73 y.o. woman with  history of Stage IIC serous uterine cancer who presents for surveillance. Completed adjuvant chemotherapy and vaginal brachytherapy in 07/2021. HER2 negative. MMR intact.   Patient is doing well.  She is NED on exam today.   Last imaging in March 2025 without evidence of recurrent disease.     Per NCCN surveillance recommendations, in the setting of high risk endometrial cancer, we will plan on visits every 3 months for the first 2-3 years. These will alternate between my office and radiation oncology. I will see her back in 6 months.   We reviewed signs and symptoms that would be concerning for cancer recurrence and I stressed the importance of calling if she develops any of these between visits.  20 minutes of total time was spent for this patient encounter, including preparation, face-to-face counseling with the patient and coordination of care, and documentation of the encounter.  Comer Dollar, MD  Division of Gynecologic Oncology  Department of Obstetrics and Gynecology  [facility]

## 2024-01-03 ENCOUNTER — Ambulatory Visit: Admitting: Gynecologic Oncology

## 2024-01-13 ENCOUNTER — Ambulatory Visit
Admission: RE | Admit: 2024-01-13 | Discharge: 2024-01-13 | Disposition: A | Discharge status: Home / Self Care | Source: Ambulatory Visit | Attending: Family Medicine | Admitting: Family Medicine

## 2024-01-13 DIAGNOSIS — Z1231 Encounter for screening mammogram for malignant neoplasm of breast: Secondary | ICD-10-CM

## 2024-03-03 ENCOUNTER — Inpatient Hospital Stay: Attending: Gynecologic Oncology

## 2024-03-03 ENCOUNTER — Other Ambulatory Visit: Payer: Medicare Other

## 2024-03-03 ENCOUNTER — Inpatient Hospital Stay: Payer: Medicare Other | Attending: Gynecologic Oncology | Admitting: Internal Medicine

## 2024-03-03 VITALS — BP 140/85 | HR 87 | Temp 97.6°F | Resp 17 | Wt 159.0 lb

## 2024-03-03 DIAGNOSIS — Z803 Family history of malignant neoplasm of breast: Secondary | ICD-10-CM | POA: Insufficient documentation

## 2024-03-03 DIAGNOSIS — Z9221 Personal history of antineoplastic chemotherapy: Secondary | ICD-10-CM | POA: Insufficient documentation

## 2024-03-03 DIAGNOSIS — R0609 Other forms of dyspnea: Secondary | ICD-10-CM | POA: Diagnosis not present

## 2024-03-03 DIAGNOSIS — Z79810 Long term (current) use of selective estrogen receptor modulators (SERMs): Secondary | ICD-10-CM | POA: Insufficient documentation

## 2024-03-03 DIAGNOSIS — Z923 Personal history of irradiation: Secondary | ICD-10-CM | POA: Diagnosis not present

## 2024-03-03 DIAGNOSIS — Z801 Family history of malignant neoplasm of trachea, bronchus and lung: Secondary | ICD-10-CM | POA: Insufficient documentation

## 2024-03-03 DIAGNOSIS — Z800 Family history of malignant neoplasm of digestive organs: Secondary | ICD-10-CM | POA: Insufficient documentation

## 2024-03-03 DIAGNOSIS — C541 Malignant neoplasm of endometrium: Secondary | ICD-10-CM

## 2024-03-03 DIAGNOSIS — Z8541 Personal history of malignant neoplasm of cervix uteri: Secondary | ICD-10-CM | POA: Diagnosis not present

## 2024-03-03 DIAGNOSIS — Z8542 Personal history of malignant neoplasm of other parts of uterus: Secondary | ICD-10-CM | POA: Diagnosis present

## 2024-03-03 DIAGNOSIS — Z86000 Personal history of in-situ neoplasm of breast: Secondary | ICD-10-CM

## 2024-03-03 DIAGNOSIS — Z90710 Acquired absence of both cervix and uterus: Secondary | ICD-10-CM | POA: Diagnosis not present

## 2024-03-03 DIAGNOSIS — Z9223 Personal history of estrogen therapy: Secondary | ICD-10-CM | POA: Insufficient documentation

## 2024-03-03 DIAGNOSIS — Z85118 Personal history of other malignant neoplasm of bronchus and lung: Secondary | ICD-10-CM | POA: Diagnosis not present

## 2024-03-03 DIAGNOSIS — M5030 Other cervical disc degeneration, unspecified cervical region: Secondary | ICD-10-CM | POA: Diagnosis not present

## 2024-03-03 DIAGNOSIS — Z9011 Acquired absence of right breast and nipple: Secondary | ICD-10-CM | POA: Diagnosis not present

## 2024-03-03 DIAGNOSIS — Z90722 Acquired absence of ovaries, bilateral: Secondary | ICD-10-CM | POA: Insufficient documentation

## 2024-03-03 DIAGNOSIS — K219 Gastro-esophageal reflux disease without esophagitis: Secondary | ICD-10-CM | POA: Diagnosis not present

## 2024-03-03 DIAGNOSIS — Z170 Estrogen receptor positive status [ER+]: Secondary | ICD-10-CM | POA: Diagnosis not present

## 2024-03-03 DIAGNOSIS — D0511 Intraductal carcinoma in situ of right breast: Secondary | ICD-10-CM | POA: Insufficient documentation

## 2024-03-03 LAB — CBC WITH DIFFERENTIAL (CANCER CENTER ONLY)
Abs Immature Granulocytes: 0.01 K/uL (ref 0.00–0.07)
Basophils Absolute: 0 K/uL (ref 0.0–0.1)
Basophils Relative: 1 %
Eosinophils Absolute: 0.4 K/uL (ref 0.0–0.5)
Eosinophils Relative: 7 %
HCT: 41.5 % (ref 36.0–46.0)
Hemoglobin: 13.7 g/dL (ref 12.0–15.0)
Immature Granulocytes: 0 %
Lymphocytes Relative: 27 %
Lymphs Abs: 1.5 K/uL (ref 0.7–4.0)
MCH: 30.4 pg (ref 26.0–34.0)
MCHC: 33 g/dL (ref 30.0–36.0)
MCV: 92 fL (ref 80.0–100.0)
Monocytes Absolute: 0.5 K/uL (ref 0.1–1.0)
Monocytes Relative: 9 %
Neutro Abs: 3.2 K/uL (ref 1.7–7.7)
Neutrophils Relative %: 56 %
Platelet Count: 266 K/uL (ref 150–400)
RBC: 4.51 MIL/uL (ref 3.87–5.11)
RDW: 13.2 % (ref 11.5–15.5)
WBC Count: 5.6 K/uL (ref 4.0–10.5)
nRBC: 0 % (ref 0.0–0.2)

## 2024-03-03 LAB — CMP (CANCER CENTER ONLY)
ALT: 15 U/L (ref 0–44)
AST: 19 U/L (ref 15–41)
Albumin: 4 g/dL (ref 3.5–5.0)
Alkaline Phosphatase: 103 U/L (ref 38–126)
Anion gap: 8 (ref 5–15)
BUN: 21 mg/dL (ref 8–23)
CO2: 28 mmol/L (ref 22–32)
Calcium: 9.8 mg/dL (ref 8.9–10.3)
Chloride: 104 mmol/L (ref 98–111)
Creatinine: 0.8 mg/dL (ref 0.44–1.00)
GFR, Estimated: >60 mL/min (ref >60)
Glucose, Bld: 102 mg/dL — ABNORMAL HIGH (ref 70–99)
Potassium: 4.8 mmol/L (ref 3.5–5.1)
Sodium: 141 mmol/L (ref 135–145)
Total Bilirubin: 0.3 mg/dL (ref 0.0–1.2)
Total Protein: 6.9 g/dL (ref 6.5–8.1)

## 2024-03-03 LAB — CEA (ACCESS): CEA (CHCC): 2.79 ng/mL (ref 0.00–5.00)

## 2024-03-03 NOTE — Progress Notes (Signed)
 [facility] [phone]   [phone]  OFFICE PROGRESS NOTE  Arloa Elsie SAUNDERS, MD 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 3) endometrial carcinoma currently managed by Dr. Lonn PRIOR THERAPY: 1) status post left trisegmentectomy with node dissection on 06/24/2004 2) status post 4 cycles of adjuvant chemotherapy with carboplatin  and paclitaxel  last dose was given 10/24/2004. 3) status post right partial mastectomy on 04/10/2012 under the care of Dr. Vanderbilt. 4) status post adjuvant radiotherapy to the right breast under the care of Dr. Jason 5) Tamoxifen  20 mg by mouth daily started in May of 2014. 6) status post laparoscopic total hysterectomy with bilateral salpingo oophorectomy and sentinel lymph node biopsy as well as omental biopsy and cystoscopy under the care of Dr. Viktoria. 7) status post systemic chemotherapy with carboplatin  and paclitaxel  under the care of Dr. Lonn for 6 cycles last dose was giving 08/22/2021.  CURRENT THERAPY: Observation.  INTERVAL HISTORY: [name] 73 y.o. female returns to the clinic today for annual follow-up visit.Discussed the use of AI scribe software for clinical note transcription with the patient, who gave verbal consent to proceed.  History of Present Illness [name] is a 73 year old female with small cell lung cancer and recurrent ductal carcinoma in situ who presents for evaluation and repeat blood work.  She was diagnosed with stage two small cell lung cancer in March 2006. No chest pain or breathing issues, although she feels slightly winded after climbing two flights of stairs.  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 A mammogram in October was reported as normal, and she has no current issues related to this condition.  Her history of endometrial carcinoma was treated with hysterectomy and chemotherapy with carboplatin  and paclitaxel .  She mentions general complaints such as back and neck pain but remains active, attending yoga once a week and walking when the weather permits. No nausea, vomiting, or significant changes in her health since the last visit.    MEDICAL HISTORY: Past Medical History:  Diagnosis Date   Anxiety    Breast cancer (HCC)    2014 and 2015   Cancer (HCC)    lung cancer    DDD (degenerative disc disease)    neck   Dry eye    Eczema    Family history of breast cancer    Family history of stomach cancer    GERD (gastroesophageal reflux disease)    occasional - no current med.   Goiter    History of breast cancer    History of lung cancer    non-small cell - left upper lobe   History of radiation therapy    Vagina- 045/04/23-05/23/23- Dr. Lynwood Nasuti   IBS (irritable bowel syndrome)    Non-allergic rhinitis    sinusitis   Osteoarthritis    Personal history of radiation therapy 2014   PONV (postoperative nausea and vomiting)    Radiation 07/03/12-07/09/12   Right upper lung 5400 cGy   Thyroid  nodule, toxic or with hyperthyroidism    no current med.   Urinary, incontinence, stress female    Vasomotor rhinitis  Vertigo     ALLERGIES:  is allergic to augmentin [amoxicillin-pot clavulanate], morphine  and codeine, amoxicillin, asa [aspirin], banana, ceftin [cefuroxime axetil], citrus, lactose intolerance (gi), orange oil, penicillins, cefuroxime, clavulanic acid, tetanus-diphth-acell pertussis, and tape.  MEDICATIONS:  Current Outpatient Medications  Medication Sig Dispense Refill   acetaminophen  (TYLENOL ) 500 MG tablet Take 1,000 mg by mouth at bedtime as needed (Arthritis pain).     Calcium  Carb-Cholecalciferol 500-10 MG-MCG TABS Take 1 tablet by mouth 2 (two) times daily.     calcium carbonate (TUMS) 500 MG chewable tablet Chew 1 tablet by mouth at bedtime.     Cholecalciferol (VITAMIN D) 50 MCG (2000 UT) CAPS Take 1 capsule by mouth daily.     cyclobenzaprine  (FLEXERIL ) 10 MG tablet Take 5-10 mg by mouth daily as needed.     cycloSPORINE  (RESTASIS ) 0.05 % ophthalmic emulsion Place 1 drop into both eyes 2 (two) times daily.      esomeprazole (NEXIUM) 20 MG capsule Take 20 mg by mouth daily.     fluocinonide  gel (LIDEX ) 0.05 % Apply 1 application topically daily as needed (canker sores).      fluticasone  (FLONASE ) 50 MCG/ACT nasal spray Place 2 sprays into both nostrils daily.      Lactobacillus (ACIDOPHILUS PO) Take 5 mg by mouth daily.     loratadine (CLARITIN) 10 MG tablet Take 10 mg by mouth daily as needed for allergies.     Magnesium 250 MG TABS Take by mouth daily.     melatonin 5 MG TABS Take 5 mg by mouth.     Olopatadine  HCl 0.2 % SOLN Place 1 drop into both eyes daily as needed (Allergies/Seasonal).     Polyethyl Glycol-Propyl Glycol (SYSTANE) 0.4-0.3 % SOLN Place 1 drop into both eyes daily as needed (Dry eye).     vitamin E  400 UNIT capsule Take 400 Units by mouth daily.      No current facility-administered medications for this visit.    SURGICAL HISTORY:  Past Surgical History:  Procedure Laterality Date   ANTERIOR CERVICAL DECOMP/DISCECTOMY FUSION N/A 12/22/2019   Procedure: Cervical four-five Cervical five-six Cervical six-seven Anterior cervical decompression/discectomy/fusion;  Surgeon: Unice Pac, MD;  Location: [facility] OR;  Service: Neurosurgery;  Laterality: N/A;   APPENDECTOMY  1971   BREAST LUMPECTOMY Right 2014   BREAST RECONSTRUCTION WITH PLACEMENT OF TISSUE EXPANDER AND FLEX HD (ACELLULAR HYDRATED DERMIS) Right 05/20/2013   Procedure: IMMEDIATE RIGHT BREAST RECONSTRUCTION WITH LATISSAMUS MUSCLE FLAP AND PLACEMENT OF TISSUE EXPANDER TO RIGHT BREAST;   Surgeon: Estefana Reichert, DO;  Location: [facility] OR;  Service: Plastics;  Laterality: Right;   BREAST REDUCTION WITH MASTOPEXY Left 10/22/2013   Procedure: BREAST REDUCTION/MASTOPEXY TO LEFT BREAST WITH LIPOSUCTION/FAT GRAFTING FOR SYMMETRY;  Surgeon: Estefana Reichert, DO;  Location: [facility];  Service: Plastics;  Laterality: Left;   CYSTOSCOPY N/A 04/11/2021   Procedure: CYSTOSCOPY;  Surgeon: Viktoria Comer SAUNDERS, MD;  Location: [facility];  Service: Gynecology;  Laterality: N/A;   DE QUERVAIN'S RELEASE Left 07/31/2002   FRACTURE SURGERY Left 2019   Torn meniscus; bone fragments removed   ganglion cyst R hand     1980s   IR IMAGING GUIDED PORT INSERTION  05/04/2021   IR REMOVAL TUN ACCESS W/ PORT W/O FL MOD SED  11/29/2021   IR US  GUIDE VASC ACCESS RIGHT  05/04/2021   KNEE SURGERY Left    torn meniscus   LUNG REMOVAL, PARTIAL     2014   MASTECTOMY Right  2015   MASTECTOMY W/ SENTINEL NODE BIOPSY Right 05/20/2013   Procedure: RIGHT SIMPLEMASTECTOMY AND SENTINEL NODE   Sampling;  Surgeon: Debby LABOR. Cornett, MD;  Location: [facility] OR;  Service: General;  Laterality: Right;   MOUTH SURGERY Right 07/2012   keratocystic odotogenic tumor - X 2   MOUTH SURGERY  10/2023   root canal   NASAL SINUS SURGERY  ?1988-1990   at least 3 (05/21/2013)   PARTIAL MASTECTOMY WITH NEEDLE LOCALIZATION  04/10/2012   Procedure: PARTIAL MASTECTOMY WITH NEEDLE LOCALIZATION;  Surgeon: Debby A. Cornett, MD;  Location: [facility];  Service: General;  Laterality: Right;   REDUCTION MAMMAPLASTY Left 2015   REMOVAL OF TISSUE EXPANDER AND PLACEMENT OF IMPLANT Right 10/22/2013   Procedure: REMOVAL OF RIGHT TISSUE EXPANDER WITH PLACEMENT OF RIGHT BREAST IMPLANT;  Surgeon: Estefana Reichert, DO;  Location: [facility];  Service: Plastics;  Laterality: Right;   ROBOTIC ASSISTED BILATERAL SALPINGO OOPHERECTOMY Bilateral 04/11/2021   Procedure: XI ROBOTIC ASSISTED HYSTERECTOMY WITH BILATERAL  SALPINGO OOPHORECTOMY; OMENTAL BIOPSY;  Surgeon: Viktoria Comer SAUNDERS, MD;  Location: [facility];  Service: Gynecology;  Laterality: Bilateral;   SENTINEL NODE BIOPSY N/A 04/11/2021   Procedure: SENTINEL NODE BIOPSY;  Surgeon: Viktoria Comer SAUNDERS, MD;  Location: [facility];  Service: Gynecology;  Laterality: N/A;   SEPTOPLASTY     & sinus surgeries 1980s-1990s   THORACOTOMY Left 07/14/2004   TRIGGER FINGER RELEASE Bilateral    I've had OR on q finger (05/21/2013)   TRIGGER FINGER RELEASE Right 07/14/1999   index and little fingers   TRIGGER FINGER RELEASE Right 07/17/2002   long and ring fingers   TRIGGER FINGER RELEASE Left 07/31/2002   little, ring and index fingers   VIDEO ASSISTED THORACOSCOPY (VATS)/ LOBECTOMY Left 07/14/2004   upper tri-segmentectomy   WISDOM TOOTH EXTRACTION     3    REVIEW OF SYSTEMS:  A comprehensive review of systems was negative.   PHYSICAL EXAMINATION: General appearance: alert, cooperative, and no distress Head: Normocephalic, without obvious abnormality, atraumatic Neck: no adenopathy Lymph nodes: Cervical, supraclavicular, and axillary nodes normal. Resp: clear to auscultation bilaterally Back: symmetric, no curvature. ROM normal. No CVA tenderness. Cardio: regular rate and rhythm, S1, S2 normal, no murmur, click, rub or gallop GI: soft, non-tender; bowel sounds normal; no masses,  no organomegaly Extremities: extremities normal, atraumatic, no cyanosis or edema  ECOG PERFORMANCE STATUS: 1 - Symptomatic but completely ambulatory  There were no vitals taken for this visit.  LABORATORY DATA: Lab Results  Component Value Date   WBC 5.6 03/03/2024   HGB 13.7 03/03/2024   HCT 41.5 03/03/2024   MCV 92.0 03/03/2024   PLT 266 03/03/2024      Chemistry      Component Value Date/Time   NA 141 03/05/2023 0800   NA 145 (H) 05/06/2019 1021   NA 141 10/29/2016 0742   K 4.2 03/05/2023 0800   K 4.1 10/29/2016 0742   CL 106 03/05/2023 0800   CL 108 (H)  04/28/2012 1008   CO2 27 03/05/2023 0800   CO2 26 10/29/2016 0742   BUN 17 03/05/2023 0800   BUN 15 05/06/2019 1021   BUN 21.1 10/29/2016 0742   CREATININE 0.75 03/05/2023 0800   CREATININE 0.9 10/29/2016 0742      Component Value Date/Time   CALCIUM 9.1 03/05/2023 0800   CALCIUM 9.0 10/29/2016 0742   ALKPHOS 88 03/05/2023 0800   ALKPHOS 49 10/29/2016 0742   AST 12 (  L) 03/05/2023 0800   AST 15 10/29/2016 0742   ALT 10 03/05/2023 0800   ALT 12 10/29/2016 0742   BILITOT 0.5 03/05/2023 0800   BILITOT 0.32 10/29/2016 0742       RADIOGRAPHIC STUDIES: No results found.  ASSESSMENT AND PLAN:  This is a very pleasant 73 years old white female with stage IIA non-small cell lung cancer as well as recurrent breast ductal carcinoma in situ. The patient is status post right lumpectomy followed by adjuvant radiotherapy.  She was also treated with adjuvant tamoxifen  for more than 9 years. The patient was diagnosed in January 2023 with endometrial carcinoma status post total abdominal hysterectomy with bilateral salpingo-oophorectomy under the care of Dr. Viktoria followed by systemic chemotherapy with carboplatin  and paclitaxel  under the care of Dr. Lonn. The patient is doing fine today with no concerning complaints.  CBC is unremarkable for any abnormality.  Comprehensive metabolic panel and CEA are still pending Assessment and Plan Assessment & Plan Non- Small cell lung cancer in remission Non- Small cell lung cancer diagnosed in March 2006, currently under surveillance. No new symptoms such as chest pain, dyspnea, nausea, or vomiting. Mild exertional dyspnea when climbing two flights of stairs, but no significant respiratory distress. - Continue annual follow-up with oncology team as needed.  Recurrent ductal carcinoma in situ of the right breast Recurrent ductal carcinoma in situ of the right breast, initially diagnosed in January 2014 with recurrence in October 2014. Recent mammogram  in October was normal.  Endometrial carcinoma status post hysterectomy and chemotherapy Endometrial carcinoma treated with hysterectomy and chemotherapy with carboplatin  and paclitaxel . Currently under observation with no new symptoms. - Continue observation with regular follow-up appointments. The patient was advised to call immediately if she has any concerning symptoms in the interval.  All questions were answered. The patient knows to call the clinic with any problems, questions or concerns. We can certainly see the patient much sooner if necessary.   Disclaimer: This note was dictated with voice recognition software. Similar sounding words can inadvertently be transcribed and may be missed upon review.

## 2024-04-01 ENCOUNTER — Other Ambulatory Visit (HOSPITAL_BASED_OUTPATIENT_CLINIC_OR_DEPARTMENT_OTHER): Payer: Self-pay | Admitting: Family Medicine

## 2024-04-01 DIAGNOSIS — M810 Age-related osteoporosis without current pathological fracture: Secondary | ICD-10-CM

## 2024-04-08 NOTE — Progress Notes (Signed)
 "  Radiation Oncology         [phone] ________________________________  Name: [name] MRN: [id number]  Date: 04/09/2024  DOB: 08-06-1950  Follow-Up Visit Note  CC: Arloa Elsie SAUNDERS, MD  Arloa Elsie SAUNDERS, MD    ICD-10-CM   1. Endometrial carcinoma (HCC)  C54.1        Diagnosis: Diagnosis: FIGO Stage IA (pT1a, pN0, cM0) Endometrial Cancer, high-grade serous; s/p adjuvant chemotherapy and VBT completed on 08/17/2021   Interval Since Last Radiation: 2 years, 7 month, and 22 days    2) Intent: Curative Radiation Treatment Dates: 07/27/2021 through 08/17/2021 Site Technique Total Dose (Gy) Dose per Fx (Gy) Completed Fx Beam Energies  Vagina: Pelvis HDR-brachy 30/30 6 5/5 Ir-192    1) DIAGNOSIS: Stage 0 (Tis N0 M0) multifocal DCIS of the right breast INDICATION FOR TREATMENT: Curative TREATMENT DATES: 05/22/2012 through 07/04/2012                                                     SITE/DOSE:  Right breast 5000 cGy 25 sessions right breast boost 1000 cGy 5 sessions                        BEAMS/ENERGY:  Tangential fields to the right breast, 6 MV photons. Electron beam boost   15 MEV electrons   Narrative:  The patient returns today for routine follow-up. She was last seen here for follow-up on 10/07/23. Patient continued to follow up with their specialists to manage their chronic conditions.   In the interval since she was last seen, she presented for a follow up visit with Dr. Viktoria on 12/27/23 during which she reported doing quite well and was noted NED on exam with no symptoms or concerns indication disease recurrence.   Mammogram on 01/13/2024 showed no mammographic evidence of malignancy.   Her most recent follow up with Dr. Sherrod on 03/03/24 where she also noted to be doing quite well. Per his recommendations, he released her from his care and recommended follow-up PRN.           No other significant oncologic interval history since the patient was last seen.    Patient reports to be doing well overall today. She notes some abdominal bloating which she attributes to her IBS. She denies any vaginal bleeding, abnormal discharge, or changes to her bowel/bladder habits. She is using her dilators at intermittently.   Allergies:  is allergic to augmentin [amoxicillin-pot clavulanate], morphine , amoxicillin, asa [aspirin], banana, ceftin [cefuroxime axetil], citrus, lactose intolerance (gi), orange oil, penicillins, cefuroxime, clavulanic acid, tetanus-diphth-acell pertussis, and tape.  Meds: Current Outpatient Medications  Medication Sig Dispense Refill   rosuvastatin (CRESTOR) 10 MG tablet 10 mg daily.     acetaminophen  (TYLENOL ) 500 MG tablet Take 1,000 mg by mouth at bedtime as needed (Arthritis pain).     Calcium Carb-Cholecalciferol 500-10 MG-MCG TABS Take 1 tablet by mouth 2 (two) times daily.     calcium carbonate (TUMS) 500 MG chewable tablet Chew 1 tablet by mouth at bedtime.     Cholecalciferol (VITAMIN D) 50 MCG (2000 UT) CAPS Take 1 capsule by mouth daily.     cyclobenzaprine  (FLEXERIL ) 10 MG tablet Take 5-10 mg by mouth daily as needed.     cycloSPORINE  (RESTASIS ) 0.05 % ophthalmic emulsion  Place 1 drop into both eyes 2 (two) times daily.      esomeprazole (NEXIUM) 20 MG capsule Take 20 mg by mouth daily.     fluocinonide  gel (LIDEX ) 0.05 % Apply 1 application topically daily as needed (canker sores).      fluticasone  (FLONASE ) 50 MCG/ACT nasal spray Place 2 sprays into both nostrils daily.      Lactobacillus (ACIDOPHILUS PO) Take 5 mg by mouth daily.     loratadine (CLARITIN) 10 MG tablet Take 10 mg by mouth daily as needed for allergies.     Magnesium 250 MG TABS Take by mouth daily.     melatonin 5 MG TABS Take 5 mg by mouth.     Olopatadine  HCl 0.2 % SOLN Place 1 drop into both eyes daily as needed (Allergies/Seasonal).     Polyethyl Glycol-Propyl Glycol (SYSTANE) 0.4-0.3 % SOLN Place 1 drop into both eyes daily as needed (Dry eye).      vitamin E  400 UNIT capsule Take 400 Units by mouth daily.      No current facility-administered medications for this encounter.    Physical Findings: The patient is in no acute distress. Patient is alert and oriented.  height is 5' 3 (1.6 m) and weight is 160 lb 3.2 oz (72.7 kg). Her temperature is 97.8 F (36.6 C). Her blood pressure is 142/77 (abnormal) and her pulse is 79. Her respiration is 20 and oxygen saturation is 100%. .  No significant changes. Lungs are clear to auscultation bilaterally. Heart has regular rate and rhythm. No palpable cervical, supraclavicular, or axillary adenopathy. Abdomen soft, non-tender, normal bowel sounds.  On pelvic examination the external genitalia were unremarkable. A speculum exam was performed. There are no mucosal lesions noted in the vaginal vault.  Some atrophy noted.  Radiation changes noted at the vaginal cuff.  On bimanual and rectovaginal examination no pelvic masses or nodularity appreciated. Vaginal cuff intact.  Rectovaginal exam is limited due to poor tolerance with hemorrhoids.   Lab Findings: Lab Results  Component Value Date   WBC 5.6 03/03/2024   HGB 13.7 03/03/2024   HCT 41.5 03/03/2024   MCV 92.0 03/03/2024   PLT 266 03/03/2024    Radiographic Findings: No results found.  Impression:  FIGO Stage IA (pT1a, pN0, cM0) Endometrial Cancer, high-grade serous; s/p adjuvant chemotherapy and VBT completed on 08/17/2021  History of: Non-Small Cell Lung Cancer (NSCLC) Stage 2A NSCLC diagnosed in March 2006. Underwent left upper lobe resection and four cycles of chemotherapy. No current symptoms or recurrence.   Recurrent Ductal Carcinoma In Situ (DCIS) Recurrent DCIS with episodes in January and October 2014. No current symptoms or recurrence.   Ms. Centola is doing well overall today and exhibits no signs of endometrial cancer recurrence on clinical exam today.   Most recent mammogram shows no evidence of malignancy.   Plan:   Patient will follow up with Dr. Viktoria in 3 months and radiation oncology in 6 months. Patient was educated on signs/symptoms that may indicate disease recurrence and understands to notify us  if she experiences any of them.    She will continue under annual follow-up with Dr. Sherrod for surveillance of her lung cancer. She will see him next in December of 2026.    30 minutes of total time was spent for this patient encounter, including preparation, face-to-face counseling with the patient and coordination of care, physical exam, and documentation of the encounter. ____________________________________   Leeroy Due, PA-C   Lynwood CHARM Nasuti,  PhD, MD   [facility]  Radiation Oncology Direct Dial: [phone]  Fax: [phone] [email or web address]    This document serves as a record of services personally performed by Lynwood Nasuti, MD. It was created on his behalf by Reymundo Cartwright, a trained medical scribe. The creation of this record is based on the scribe's personal observations and the provider's statements to them. This document has been checked and approved by the attending provider.   "

## 2024-04-09 ENCOUNTER — Ambulatory Visit
Admission: RE | Admit: 2024-04-09 | Discharge: 2024-04-09 | Disposition: A | Discharge status: Home / Self Care | Source: Ambulatory Visit | Attending: Radiation Oncology | Admitting: Radiation Oncology

## 2024-04-09 ENCOUNTER — Encounter: Payer: Self-pay | Admitting: Radiation Oncology

## 2024-04-09 VITALS — BP 142/77 | HR 79 | Temp 97.8°F | Resp 20 | Ht 63.0 in | Wt 160.2 lb

## 2024-04-09 DIAGNOSIS — Z86000 Personal history of in-situ neoplasm of breast: Secondary | ICD-10-CM | POA: Diagnosis not present

## 2024-04-09 DIAGNOSIS — Z79899 Other long term (current) drug therapy: Secondary | ICD-10-CM | POA: Diagnosis not present

## 2024-04-09 DIAGNOSIS — Z9221 Personal history of antineoplastic chemotherapy: Secondary | ICD-10-CM | POA: Diagnosis not present

## 2024-04-09 DIAGNOSIS — Z85118 Personal history of other malignant neoplasm of bronchus and lung: Secondary | ICD-10-CM | POA: Diagnosis not present

## 2024-04-09 DIAGNOSIS — Z923 Personal history of irradiation: Secondary | ICD-10-CM | POA: Insufficient documentation

## 2024-04-09 DIAGNOSIS — Z8542 Personal history of malignant neoplasm of other parts of uterus: Secondary | ICD-10-CM | POA: Diagnosis present

## 2024-04-09 DIAGNOSIS — C541 Malignant neoplasm of endometrium: Secondary | ICD-10-CM

## 2024-04-09 NOTE — Progress Notes (Signed)
 [name] is here today for follow up post radiation to the pelvis,   They completed their radiation on: 08/17/21   Does the patient complain of any of the following:  Pain: No  Abdominal bloating: Yes, due to IBS.  Diarrhea/Constipation:  No  Nausea/Vomiting: No Vaginal Discharge: No Blood in Urine or Stool: No Urinary Issues (dysuria/incomplete emptying/ incontinence/ increased frequency/urgency): No Does patient report using vaginal dilator 2-3 times a week and/or sexually active 2-3 weeks:  Yes, patient uses dilators at times.  Post radiation skin changes: No   Additional comments if applicable:  Patient reports having hemorrhoids. Patient reports having inflammation after rectal exams.   BP (!) 142/77 (BP Location: Left Arm, Patient Position: Sitting, Cuff Size: Large)   Pulse 79   Temp 97.8 F (36.6 C)   Resp 20   Ht 5' 3 (1.6 m)   Wt 160 lb 3.2 oz (72.7 kg)   SpO2 100%   BMI 28.38 kg/m

## 2024-06-26 ENCOUNTER — Ambulatory Visit: Admitting: Gynecologic Oncology

## 2024-07-10 ENCOUNTER — Ambulatory Visit: Admitting: Gynecologic Oncology

## 2024-08-05 ENCOUNTER — Other Ambulatory Visit (HOSPITAL_BASED_OUTPATIENT_CLINIC_OR_DEPARTMENT_OTHER)

## 2024-10-05 ENCOUNTER — Ambulatory Visit: Admitting: Radiation Oncology
